# Patient Record
Sex: Male | Born: 1945
Health system: Southern US, Community
[De-identification: ages and names within clinical notes are randomized; demographics above are authoritative.]

## PROBLEM LIST (undated history)

## (undated) DIAGNOSIS — F4310 Post-traumatic stress disorder, unspecified: Secondary | ICD-10-CM

## (undated) DIAGNOSIS — F32A Depression, unspecified: Secondary | ICD-10-CM

## (undated) DIAGNOSIS — J449 Chronic obstructive pulmonary disease, unspecified: Secondary | ICD-10-CM

## (undated) DIAGNOSIS — G4730 Sleep apnea, unspecified: Secondary | ICD-10-CM

## (undated) DIAGNOSIS — I420 Dilated cardiomyopathy: Secondary | ICD-10-CM

## (undated) DIAGNOSIS — F329 Major depressive disorder, single episode, unspecified: Secondary | ICD-10-CM

## (undated) DIAGNOSIS — I10 Essential (primary) hypertension: Secondary | ICD-10-CM

---

## 1997-11-08 ENCOUNTER — Other Ambulatory Visit: Admission: RE | Admit: 1997-11-08 | Discharge: 1997-11-08 | Payer: Self-pay | Admitting: Internal Medicine

## 2001-04-27 ENCOUNTER — Emergency Department (HOSPITAL_COMMUNITY): Admission: EM | Admit: 2001-04-27 | Discharge: 2001-04-27 | Payer: Self-pay | Admitting: Emergency Medicine

## 2001-04-27 ENCOUNTER — Encounter: Payer: Self-pay | Admitting: Emergency Medicine

## 2001-05-04 ENCOUNTER — Emergency Department (HOSPITAL_COMMUNITY): Admission: EM | Admit: 2001-05-04 | Discharge: 2001-05-04 | Payer: Self-pay | Admitting: *Deleted

## 2013-04-24 ENCOUNTER — Inpatient Hospital Stay (HOSPITAL_COMMUNITY)
Admission: EM | Admit: 2013-04-24 | Discharge: 2013-04-27 | DRG: 208 | Disposition: A | Discharge status: Home / Self Care | Payer: Non-veteran care | Attending: Pulmonary Disease | Admitting: Pulmonary Disease

## 2013-04-24 ENCOUNTER — Encounter (HOSPITAL_COMMUNITY): Payer: Self-pay | Admitting: Emergency Medicine

## 2013-04-24 ENCOUNTER — Emergency Department (HOSPITAL_COMMUNITY): Payer: Non-veteran care

## 2013-04-24 DIAGNOSIS — E872 Acidosis, unspecified: Secondary | ICD-10-CM | POA: Diagnosis present

## 2013-04-24 DIAGNOSIS — E876 Hypokalemia: Secondary | ICD-10-CM | POA: Diagnosis present

## 2013-04-24 DIAGNOSIS — Z79899 Other long term (current) drug therapy: Secondary | ICD-10-CM

## 2013-04-24 DIAGNOSIS — J449 Chronic obstructive pulmonary disease, unspecified: Secondary | ICD-10-CM | POA: Diagnosis present

## 2013-04-24 DIAGNOSIS — J441 Chronic obstructive pulmonary disease with (acute) exacerbation: Secondary | ICD-10-CM

## 2013-04-24 DIAGNOSIS — G4730 Sleep apnea, unspecified: Secondary | ICD-10-CM | POA: Diagnosis present

## 2013-04-24 DIAGNOSIS — J9600 Acute respiratory failure, unspecified whether with hypoxia or hypercapnia: Secondary | ICD-10-CM | POA: Diagnosis present

## 2013-04-24 DIAGNOSIS — G9340 Encephalopathy, unspecified: Secondary | ICD-10-CM | POA: Diagnosis not present

## 2013-04-24 DIAGNOSIS — I10 Essential (primary) hypertension: Secondary | ICD-10-CM | POA: Diagnosis present

## 2013-04-24 DIAGNOSIS — J9690 Respiratory failure, unspecified, unspecified whether with hypoxia or hypercapnia: Secondary | ICD-10-CM

## 2013-04-24 DIAGNOSIS — Z87891 Personal history of nicotine dependence: Secondary | ICD-10-CM

## 2013-04-24 HISTORY — DX: Sleep apnea, unspecified: G47.30

## 2013-04-24 HISTORY — DX: Chronic obstructive pulmonary disease, unspecified: J44.9

## 2013-04-24 LAB — POCT I-STAT 3, ART BLOOD GAS (G3+)
Acid-base deficit: 6 mmol/L — ABNORMAL HIGH (ref 0.0–2.0)
Acid-base deficit: 7 mmol/L — ABNORMAL HIGH (ref 0.0–2.0)
BICARBONATE: 27.4 meq/L — AB (ref 20.0–24.0)
Bicarbonate: 23.2 mEq/L (ref 20.0–24.0)
O2 Saturation: 100 %
O2 Saturation: 100 %
PH ART: 7.101 — AB (ref 7.350–7.450)
PO2 ART: 243 mmHg — AB (ref 80.0–100.0)
PO2 ART: 463 mmHg — AB (ref 80.0–100.0)
Patient temperature: 34.6
Patient temperature: 35.3
TCO2: 30 mmol/L (ref 0–100)
TCO2: 25 mmol/L (ref 0–100)
pCO2 arterial: 85.1 mmHg (ref 35.0–45.0)
pCO2 arterial: 61.4 mmHg (ref 35.0–45.0)
pH, Arterial: 7.175 — CL (ref 7.350–7.450)

## 2013-04-24 LAB — BASIC METABOLIC PANEL
BUN: 13 mg/dL (ref 6–23)
CHLORIDE: 98 meq/L (ref 96–112)
CO2: 29 meq/L (ref 19–32)
Calcium: 9.9 mg/dL (ref 8.4–10.5)
Creatinine, Ser: 1.1 mg/dL (ref 0.50–1.35)
GFR calc non Af Amer: 69 mL/min — ABNORMAL LOW (ref >90)
GFR, EST AFRICAN AMERICAN: 79 mL/min — AB (ref >90)
Glucose, Bld: 152 mg/dL — ABNORMAL HIGH (ref 70–99)
POTASSIUM: 3.3 meq/L — AB (ref 3.7–5.3)
Sodium: 142 mEq/L (ref 137–147)

## 2013-04-24 LAB — CG4 I-STAT (LACTIC ACID): LACTIC ACID, VENOUS: 4.88 mmol/L — AB (ref 0.5–2.2)

## 2013-04-24 LAB — CBC
HCT: 50.1 % (ref 39.0–52.0)
Hemoglobin: 16.9 g/dL (ref 13.0–17.0)
MCH: 31.5 pg (ref 26.0–34.0)
MCHC: 33.7 g/dL (ref 30.0–36.0)
MCV: 93.3 fL (ref 78.0–100.0)
Platelets: 272 10*3/uL (ref 150–400)
RBC: 5.37 MIL/uL (ref 4.22–5.81)
RDW: 13.9 % (ref 11.5–15.5)
WBC: 13.7 10*3/uL — AB (ref 4.0–10.5)

## 2013-04-24 LAB — LACTIC ACID, PLASMA: Lactic Acid, Venous: 3.1 mmol/L — ABNORMAL HIGH (ref 0.5–2.2)

## 2013-04-24 LAB — PRO B NATRIURETIC PEPTIDE

## 2013-04-24 LAB — TROPONIN I: Troponin I: <0.3 ng/mL (ref <0.30)

## 2013-04-24 MED ORDER — CALCIUM GLUCONATE 10 % IV SOLN: 1.0000 g | Freq: Once | INTRAVENOUS | Status: DC

## 2013-04-24 MED ORDER — ALBUTEROL SULFATE (2.5 MG/3ML) 0.083% IN NEBU
5.0000 mg | INHALATION_SOLUTION | Freq: Once | RESPIRATORY_TRACT | Status: AC
  Administered 2013-04-24: 5 mg via RESPIRATORY_TRACT
  Filled 2013-04-24: qty 6

## 2013-04-24 MED ORDER — PROPOFOL 10 MG/ML IV EMUL
INTRAVENOUS | Status: AC
  Administered 2013-04-24: 5 ug/kg/min via INTRAVENOUS
  Filled 2013-04-24: qty 100

## 2013-04-24 MED ORDER — MIDAZOLAM HCL 2 MG/2ML IJ SOLN
INTRAMUSCULAR | Status: AC
  Administered 2013-04-24: 2 mg via INTRAVENOUS
  Filled 2013-04-24: qty 2

## 2013-04-24 MED ORDER — SUCCINYLCHOLINE CHLORIDE 20 MG/ML IJ SOLN
100.0000 mg | Freq: Once | INTRAMUSCULAR | Status: AC
  Administered 2013-04-24: 100 mg via INTRAVENOUS

## 2013-04-24 MED ORDER — MIDAZOLAM HCL 2 MG/2ML IJ SOLN
2.0000 mg | INTRAMUSCULAR | Status: DC | PRN
  Administered 2013-04-24 – 2013-04-25 (×3): 2 mg via INTRAVENOUS

## 2013-04-24 MED ORDER — SODIUM CHLORIDE 0.9 % IV SOLN: 1.0000 g | Freq: Once | INTRAVENOUS | Status: DC

## 2013-04-24 MED ORDER — CALCIUM CHLORIDE 10 % IV SOLN
1.0000 g | Freq: Once | INTRAVENOUS | Status: AC
  Administered 2013-04-24: 1 g via INTRAVENOUS

## 2013-04-24 MED ORDER — IPRATROPIUM BROMIDE 0.02 % IN SOLN
0.5000 mg | Freq: Once | RESPIRATORY_TRACT | Status: AC
  Administered 2013-04-24: 0.5 mg via RESPIRATORY_TRACT
  Filled 2013-04-24: qty 2.5

## 2013-04-24 MED ORDER — FENTANYL CITRATE 0.05 MG/ML IJ SOLN
INTRAMUSCULAR | Status: AC
  Administered 2013-04-24: 50 ug via INTRAVENOUS
  Filled 2013-04-24: qty 2

## 2013-04-24 MED ORDER — PROPOFOL 10 MG/ML IV EMUL
5.0000 ug/kg/min | Freq: Once | INTRAVENOUS | Status: DC
  Administered 2013-04-24: 5 ug/kg/min via INTRAVENOUS

## 2013-04-24 MED ORDER — FENTANYL CITRATE 0.05 MG/ML IJ SOLN
50.0000 ug | INTRAMUSCULAR | Status: DC | PRN
  Administered 2013-04-24 – 2013-04-25 (×4): 50 ug via INTRAVENOUS

## 2013-04-24 MED ORDER — ETOMIDATE 2 MG/ML IV SOLN
20.0000 mg | Freq: Once | INTRAVENOUS | Status: AC
  Administered 2013-04-24: 20 mg via INTRAVENOUS

## 2013-04-24 MED ORDER — FENTANYL CITRATE 0.05 MG/ML IJ SOLN
INTRAMUSCULAR | Status: AC
  Filled 2013-04-24: qty 2

## 2013-04-24 MED ORDER — MIDAZOLAM HCL 2 MG/2ML IJ SOLN
INTRAMUSCULAR | Status: AC
  Filled 2013-04-24: qty 2

## 2013-04-24 NOTE — ED Notes (Signed)
PT intubated by Dr. Mingo Amber, 7.5 ETT tube, 25cm at the lip.

## 2013-04-24 NOTE — ED Provider Notes (Signed)
CSN: [id number]     Arrival date & time 04/24/13  2154 History   First MD Initiated Contact with Patient 04/24/13 2154     Chief Complaint  Patient presents with  . Shortness of Breath   (Consider location/radiation/quality/duration/timing/severity/associated sxs/prior Treatment) Patient is a 68 y.o. male presenting with shortness of breath. The history is provided by the EMS personnel. The history is limited by the absence of a caregiver.  Shortness of Breath Severity:  Severe Onset quality:  Sudden Progression:  Worsening Relieved by:  Nothing Ineffective treatments:  Inhaler Associated symptoms: no abdominal pain and no chest pain   Level 5 caveat: Patient unresponsive upon initial evaluation  No past medical history on file. No past surgical history on file. No family history on file. History  Substance Use Topics  . Smoking status: Not on file  . Smokeless tobacco: Not on file  . Alcohol Use: Not on file    Review of Systems  Unable to perform ROS: Patient unresponsive  Respiratory: Positive for shortness of breath.   Cardiovascular: Negative for chest pain.  Gastrointestinal: Negative for abdominal pain.    Allergies  Review of patient's allergies indicates no known allergies.  Home Medications   Current Outpatient Rx  Name  Route  Sig  Dispense  Refill  . albuterol (PROVENTIL HFA;VENTOLIN HFA) 108 (90 BASE) MCG/ACT inhaler   Inhalation   Inhale 2 puffs into the lungs every 6 (six) hours.         . budesonide-formoterol (SYMBICORT) 160-4.5 MCG/ACT inhaler   Inhalation   Inhale 2 puffs into the lungs 2 (two) times daily.         [name] Kitchen escitalopram (LEXAPRO) 10 MG tablet   Oral   Take 10 mg by mouth daily.         . hydrochlorothiazide (HYDRODIURIL) 25 MG tablet   Oral   Take 25 mg by mouth daily.         . hydroxypropyl methylcellulose (ISOPTO TEARS) 2.5 % ophthalmic solution   Both Eyes   Place 2 drops into both eyes 5 (five) times daily as needed  for dry eyes. For dry eyes         . hydrOXYzine (ATARAX/VISTARIL) 25 MG tablet   Oral   Take 25-50 mg by mouth 3 (three) times daily as needed for itching.         . magnesium oxide (MAG-OX) 400 MG tablet   Oral   Take 400 mg by mouth daily. For leg cramps         . Multiple Vitamin (MULTIVITAMIN WITH MINERALS) TABS tablet   Oral   Take 1 tablet by mouth daily.         [name] Kitchen tiotropium (SPIRIVA) 18 MCG inhalation capsule   Inhalation   Place 18 mcg into inhaler and inhale daily.         . ALBUTEROL IN   Inhalation   Inhale into the lungs once. Per notes-Albuterol nebulizer treatments x2 doses given en route by EMS          BP 137/104  Pulse 100  Resp 20  Wt 154 lb 5.2 oz (70 kg)  SpO2 97% Physical Exam  Constitutional: He appears well-developed and well-nourished. He appears toxic. No distress. Nasal cannula in place.  HENT:  Head: Normocephalic and atraumatic.  Eyes: Pupils are equal, round, and reactive to light.  Neck: Normal range of motion.  Cardiovascular: Normal rate and regular rhythm.   Pulmonary/Chest: Tachypnea noted. He  is in respiratory distress. He has decreased breath sounds. He has wheezes.  Abdominal: Soft. He exhibits no distension. There is no tenderness.  Musculoskeletal: Normal range of motion.  Neurological: He is unresponsive. GCS eye subscore is 2. GCS verbal subscore is 2. GCS motor subscore is 4.  Skin: Skin is warm. He is not diaphoretic.    ED Course  Procedures (including critical care time) Labs Review Labs Reviewed  CBC - Abnormal; Notable for the following:    WBC 13.7 (*)    All other components within normal limits  LACTIC ACID, PLASMA - Abnormal; Notable for the following:    Lactic Acid, Venous 3.1 (*)    All other components within normal limits  POCT I-STAT 3, BLOOD GAS (G3+) - Abnormal; Notable for the following:    pH, Arterial 7.101 (*)    pCO2 arterial 85.1 (*)    pO2, Arterial 243.0 (*)    Bicarbonate 27.4 (*)     Acid-base deficit 6.0 (*)    All other components within normal limits  CG4 I-STAT (LACTIC ACID) - Abnormal; Notable for the following:    Lactic Acid, Venous 4.88 (*)    All other components within normal limits  POCT I-STAT 3, BLOOD GAS (G3+) - Abnormal; Notable for the following:    pH, Arterial 7.175 (*)    pCO2 arterial 61.4 (*)    pO2, Arterial 463.0 (*)    Acid-base deficit 7.0 (*)    All other components within normal limits  BASIC METABOLIC PANEL  TROPONIN I  PRO B NATRIURETIC PEPTIDE  URINALYSIS, ROUTINE W REFLEX MICROSCOPIC   Imaging Review Dg Chest Portable 1 View  04/24/2013   CLINICAL DATA:  Status post ET tube placement.  EXAM: PORTABLE CHEST - 1 VIEW  COMPARISON:  None.  FINDINGS: ET tube is in place with the tip 3.6 cm above the carina. NG tube courses into the stomach and below the inferior margin of the film. Lungs are clear. Heart size is normal. No pneumothorax or pleural effusion. Remote left 4th and 5th rib fractures are noted.  IMPRESSION: ET tube tip is 3.6 cm with a carina. NG tube tip is below the inferior margin of the film.  Lungs clear.   Electronically Signed   By: Inge Rise M.D.   On: 04/24/2013 22:31    EKG Interpretation   None       MDM   1. Respiratory failure    68 yo M with hx of COPD presents in respiratory distress.   Patient with GCS <8 upon arrival. HDS en route, went unresponsive. Patient intubated upon arrival, as described above. Labs demonstrate acute respiratory acidosis. Will ventilate to reduce PCO2, will admit to ICU. Otherwise HDS.   Patient will be admitted to ICU with intensivist. Consulted critical care, fellow to evaluate. Patient given nebs through ETT given continued expiratory wheezing. Propofol gtt for sedation caused mild hypotension, will transition to prn fentanyl/versed. Anticipate admission to ICU in poor but stable condition with critical care. Patient seen and evaluated by myself and my attending, Dr.  Karle Starch.      Freddi Che, MD 04/24/13 2350

## 2013-04-24 NOTE — ED Notes (Signed)
Per EMS, pt had sudden onset of SOB this afternoon. Pt was a&o x4 in route and then became unresponsive five minutes before arriving at our facility. Pt vomited prior to arrival. Pt has hx of COPD, Emphysema, and hypertension. Pt given 2 albuterol treatments and 125 of solumedrol en route.

## 2013-04-24 NOTE — ED Notes (Signed)
Family at bedside. 

## 2013-04-24 NOTE — ED Notes (Signed)
1 amp of Calcium Chloride given.

## 2013-04-24 NOTE — ED Provider Notes (Addendum)
I saw and evaluated the patient, reviewed the resident's note and I agree with the findings and plan.  EKG Interpretation    Date/Time:  Saturday April 24 2013 22:16:04 EST Ventricular Rate:  121 PR Interval:  140 QRS Duration: 88 QT Interval:  318 QTC Calculation: 451 R Axis:   84 Text Interpretation:  Sinus tachycardia Probable left atrial enlargement Borderline right axis deviation ST elevation, consider inferior injury Since last tracing wide complrex tachycardia resolved Confirmed by Karle Starch  MD, CHARLES ([id number]) on 04/24/2013 11:56:56 PM           CRITICAL CARE Performed by: Truddie Hidden. Total critical care time: 35 Critical care time was exclusive of separately billable procedures and treating other patients. Critical care was necessary to treat or prevent imminent or life-threatening deterioration. Critical care was time spent personally by me on the following activities: development of treatment plan with patient and/or surrogate as well as nursing, discussions with consultants, evaluation of patient's response to treatment, examination of patient, obtaining history from patient or surrogate, ordering and performing treatments and interventions, ordering and review of laboratory studies, ordering and review of radiographic studies, pulse oximetry and re-evaluation of patient's condition.   Pt with severe COPD exacerbation, given nebs enroute but worsening mental status, also vomited prior to arrival so not a candidate for BiPAP. EKG rhythm change during intubation resolved spontaneously. EKGs reviewed with DR. Cooper who did not feel this was STEMI.   I was present at bedside during intubation.   Charles B. Karle Starch, MD 04/24/13 [place] Karle Starch, MD 04/25/13 3267

## 2013-04-24 NOTE — Progress Notes (Signed)
Chaplain responded to page from secretary concerning pt being intubated. Family was calm and said they were "great." No further needs at this time.

## 2013-04-24 NOTE — ED Notes (Signed)
EDP Sheldon at the bedside.

## 2013-04-24 NOTE — ED Notes (Signed)
Family in consultation room A 

## 2013-04-25 DIAGNOSIS — J449 Chronic obstructive pulmonary disease, unspecified: Secondary | ICD-10-CM | POA: Diagnosis present

## 2013-04-25 DIAGNOSIS — J9600 Acute respiratory failure, unspecified whether with hypoxia or hypercapnia: Secondary | ICD-10-CM

## 2013-04-25 DIAGNOSIS — J441 Chronic obstructive pulmonary disease with (acute) exacerbation: Secondary | ICD-10-CM | POA: Diagnosis present

## 2013-04-25 LAB — CBC
HCT: 43.2 % (ref 39.0–52.0)
HEMOGLOBIN: 14.7 g/dL (ref 13.0–17.0)
MCH: 31.4 pg (ref 26.0–34.0)
MCHC: 34 g/dL (ref 30.0–36.0)
MCV: 92.3 fL (ref 78.0–100.0)
Platelets: 198 10*3/uL (ref 150–400)
RBC: 4.68 MIL/uL (ref 4.22–5.81)
RDW: 14 % (ref 11.5–15.5)
WBC: 13.9 10*3/uL — AB (ref 4.0–10.5)

## 2013-04-25 LAB — BLOOD GAS, ARTERIAL
ACID-BASE DEFICIT: 3.3 mmol/L — AB (ref 0.0–2.0)
Bicarbonate: 20.2 mEq/L (ref 20.0–24.0)
Drawn by: 34779
FIO2: 0.4 %
O2 SAT: 98.9 %
PATIENT TEMPERATURE: 98.6
PCO2 ART: 30.2 mmHg — AB (ref 35.0–45.0)
PEEP: 5 cmH2O
RATE: 22 resp/min
TCO2: 21.1 mmol/L (ref 0–100)
VT: 620 mL
pH, Arterial: 7.44 (ref 7.350–7.450)
pO2, Arterial: 121 mmHg — ABNORMAL HIGH (ref 80.0–100.0)

## 2013-04-25 LAB — BASIC METABOLIC PANEL
BUN: 15 mg/dL (ref 6–23)
CALCIUM: 9.3 mg/dL (ref 8.4–10.5)
CO2: 23 meq/L (ref 19–32)
Chloride: 104 mEq/L (ref 96–112)
Creatinine, Ser: 0.95 mg/dL (ref 0.50–1.35)
GFR calc Af Amer: >90 mL/min (ref >90)
GFR calc non Af Amer: 85 mL/min — ABNORMAL LOW (ref >90)
Glucose, Bld: 130 mg/dL — ABNORMAL HIGH (ref 70–99)
Potassium: 4.8 mEq/L (ref 3.7–5.3)
Sodium: 140 mEq/L (ref 137–147)

## 2013-04-25 LAB — URINALYSIS, ROUTINE W REFLEX MICROSCOPIC
BILIRUBIN URINE: NEGATIVE
Glucose, UA: NEGATIVE mg/dL
Ketones, ur: NEGATIVE mg/dL
Leukocytes, UA: NEGATIVE
Nitrite: NEGATIVE
Protein, ur: >300 mg/dL — AB
Specific Gravity, Urine: 1.021 (ref 1.005–1.030)
UROBILINOGEN UA: 0.2 mg/dL (ref 0.0–1.0)
pH: 6.5 (ref 5.0–8.0)

## 2013-04-25 LAB — URINE MICROSCOPIC-ADD ON

## 2013-04-25 LAB — GLUCOSE, CAPILLARY
GLUCOSE-CAPILLARY: 125 mg/dL — AB (ref 70–99)
GLUCOSE-CAPILLARY: 126 mg/dL — AB (ref 70–99)
GLUCOSE-CAPILLARY: 136 mg/dL — AB (ref 70–99)
GLUCOSE-CAPILLARY: 145 mg/dL — AB (ref 70–99)
GLUCOSE-CAPILLARY: 164 mg/dL — AB (ref 70–99)
Glucose-Capillary: 157 mg/dL — ABNORMAL HIGH (ref 70–99)

## 2013-04-25 LAB — MAGNESIUM
MAGNESIUM: 2 mg/dL (ref 1.5–2.5)
Magnesium: 2 mg/dL (ref 1.5–2.5)

## 2013-04-25 LAB — RENAL FUNCTION PANEL
Albumin: 3.4 g/dL — ABNORMAL LOW (ref 3.5–5.2)
BUN: 14 mg/dL (ref 6–23)
CHLORIDE: 105 meq/L (ref 96–112)
CO2: 21 meq/L (ref 19–32)
Calcium: 9.2 mg/dL (ref 8.4–10.5)
Creatinine, Ser: 0.98 mg/dL (ref 0.50–1.35)
GFR calc non Af Amer: 84 mL/min — ABNORMAL LOW (ref >90)
GLUCOSE: 159 mg/dL — AB (ref 70–99)
Phosphorus: 2.8 mg/dL (ref 2.3–4.6)
Potassium: 5.9 mEq/L — ABNORMAL HIGH (ref 3.7–5.3)
SODIUM: 141 meq/L (ref 137–147)

## 2013-04-25 LAB — PROTIME-INR
INR: 1.03 (ref 0.00–1.49)
Prothrombin Time: 13.3 seconds (ref 11.6–15.2)

## 2013-04-25 LAB — LACTIC ACID, PLASMA
LACTIC ACID, VENOUS: 3.9 mmol/L — AB (ref 0.5–2.2)
LACTIC ACID, VENOUS: 3.9 mmol/L — AB (ref 0.5–2.2)

## 2013-04-25 LAB — APTT: aPTT: 27 seconds (ref 24–37)

## 2013-04-25 LAB — TRIGLYCERIDES: Triglycerides: 29 mg/dL (ref <150)

## 2013-04-25 LAB — SURGICAL PCR SCREEN
MRSA, PCR: NEGATIVE
Staphylococcus aureus: NEGATIVE

## 2013-04-25 MED ORDER — PROPOFOL 10 MG/ML IV EMUL: 0.0000 ug/kg/min | INTRAVENOUS | Status: DC

## 2013-04-25 MED ORDER — BUDESONIDE 0.5 MG/2ML IN SUSP
0.5000 mg | Freq: Two times a day (BID) | RESPIRATORY_TRACT | Status: DC
  Administered 2013-04-25 – 2013-04-27 (×5): 0.5 mg via RESPIRATORY_TRACT
  Filled 2013-04-25 (×7): qty 2

## 2013-04-25 MED ORDER — INSULIN ASPART 100 UNIT/ML ~~LOC~~ SOLN
0.0000 [IU] | SUBCUTANEOUS | Status: DC
  Administered 2013-04-25: 2 [IU] via SUBCUTANEOUS
  Administered 2013-04-25 (×2): 3 [IU] via SUBCUTANEOUS
  Administered 2013-04-25 – 2013-04-27 (×7): 2 [IU] via SUBCUTANEOUS

## 2013-04-25 MED ORDER — DEXTROSE-NACL 5-0.45 % IV SOLN
INTRAVENOUS | Status: DC
  Administered 2013-04-25: 10:00:00 via INTRAVENOUS

## 2013-04-25 MED ORDER — FENTANYL CITRATE 0.05 MG/ML IJ SOLN
INTRAMUSCULAR | Status: AC
  Filled 2013-04-25: qty 2

## 2013-04-25 MED ORDER — PANTOPRAZOLE SODIUM 40 MG PO TBEC
40.0000 mg | DELAYED_RELEASE_TABLET | Freq: Every day | ORAL | Status: DC
  Administered 2013-04-25: 40 mg via ORAL
  Filled 2013-04-25: qty 1

## 2013-04-25 MED ORDER — METHYLPREDNISOLONE SODIUM SUCC 125 MG IJ SOLR
60.0000 mg | Freq: Three times a day (TID) | INTRAMUSCULAR | Status: DC
  Administered 2013-04-25 – 2013-04-26 (×3): 60 mg via INTRAVENOUS
  Filled 2013-04-25 (×6): qty 0.96

## 2013-04-25 MED ORDER — POTASSIUM CHLORIDE 10 MEQ/100ML IV SOLN
10.0000 meq | INTRAVENOUS | Status: AC
  Administered 2013-04-25: 10 meq via INTRAVENOUS
  Filled 2013-04-25 (×2): qty 100

## 2013-04-25 MED ORDER — METHYLPREDNISOLONE SODIUM SUCC 125 MG IJ SOLR
60.0000 mg | Freq: Four times a day (QID) | INTRAMUSCULAR | Status: DC
  Administered 2013-04-25 (×2): 60 mg via INTRAVENOUS
  Filled 2013-04-25 (×5): qty 0.96
  Filled 2013-04-25: qty 2

## 2013-04-25 MED ORDER — BIOTENE DRY MOUTH MT LIQD
15.0000 mL | Freq: Four times a day (QID) | OROMUCOSAL | Status: DC
  Administered 2013-04-25 (×3): 15 mL via OROMUCOSAL

## 2013-04-25 MED ORDER — CHLORHEXIDINE GLUCONATE 0.12 % MT SOLN
15.0000 mL | Freq: Two times a day (BID) | OROMUCOSAL | Status: DC
  Administered 2013-04-25: 15 mL via OROMUCOSAL
  Filled 2013-04-25: qty 15

## 2013-04-25 MED ORDER — POTASSIUM CHLORIDE 10 MEQ/100ML IV SOLN
10.0000 meq | INTRAVENOUS | Status: DC
  Administered 2013-04-25: 10 meq via INTRAVENOUS

## 2013-04-25 MED ORDER — LEVOFLOXACIN IN D5W 750 MG/150ML IV SOLN
750.0000 mg | INTRAVENOUS | Status: DC
  Administered 2013-04-25 – 2013-04-26 (×2): 750 mg via INTRAVENOUS
  Filled 2013-04-25 (×2): qty 150

## 2013-04-25 MED ORDER — ARFORMOTEROL TARTRATE 15 MCG/2ML IN NEBU
15.0000 ug | INHALATION_SOLUTION | Freq: Two times a day (BID) | RESPIRATORY_TRACT | Status: DC
  Administered 2013-04-25 – 2013-04-27 (×5): 15 ug via RESPIRATORY_TRACT
  Filled 2013-04-25 (×7): qty 2

## 2013-04-25 MED ORDER — ESCITALOPRAM OXALATE 10 MG PO TABS
10.0000 mg | ORAL_TABLET | Freq: Every day | ORAL | Status: DC
  Administered 2013-04-25 – 2013-04-27 (×3): 10 mg via ORAL
  Filled 2013-04-25 (×3): qty 1

## 2013-04-25 MED ORDER — VECURONIUM BROMIDE 10 MG IV SOLR: 10.0000 mg | Freq: Once | INTRAVENOUS | Status: DC

## 2013-04-25 MED ORDER — PANTOPRAZOLE SODIUM 40 MG PO PACK
40.0000 mg | PACK | Freq: Every day | ORAL | Status: DC
  Filled 2013-04-25: qty 20

## 2013-04-25 MED ORDER — BIOTENE DRY MOUTH MT LIQD
15.0000 mL | Freq: Two times a day (BID) | OROMUCOSAL | Status: DC
  Administered 2013-04-25: 15 mL via OROMUCOSAL

## 2013-04-25 MED ORDER — SODIUM CHLORIDE 0.9 % IV SOLN
25.0000 ug/h | INTRAVENOUS | Status: DC
  Administered 2013-04-25: 25 ug/h via INTRAVENOUS
  Filled 2013-04-25: qty 50

## 2013-04-25 MED ORDER — IPRATROPIUM-ALBUTEROL 0.5-2.5 (3) MG/3ML IN SOLN
3.0000 mL | RESPIRATORY_TRACT | Status: DC
  Administered 2013-04-25 (×3): 3 mL via RESPIRATORY_TRACT
  Filled 2013-04-25 (×3): qty 3

## 2013-04-25 MED ORDER — MIDAZOLAM HCL 2 MG/2ML IJ SOLN
INTRAMUSCULAR | Status: AC
  Filled 2013-04-25: qty 2

## 2013-04-25 MED ORDER — ASPIRIN 81 MG PO CHEW: 324.0000 mg | CHEWABLE_TABLET | ORAL | Status: AC

## 2013-04-25 MED ORDER — ASPIRIN 300 MG RE SUPP
300.0000 mg | RECTAL | Status: AC
Start: 2013-04-25 — End: 2013-04-25
  Administered 2013-04-25: 300 mg via RECTAL
  Filled 2013-04-25: qty 1

## 2013-04-25 MED ORDER — ALBUTEROL SULFATE (2.5 MG/3ML) 0.083% IN NEBU: 2.5000 mg | INHALATION_SOLUTION | RESPIRATORY_TRACT | Status: DC | PRN

## 2013-04-25 MED ORDER — SODIUM CHLORIDE 0.9 % IV SOLN: 250.0000 mL | INTRAVENOUS | Status: DC | PRN

## 2013-04-25 MED ORDER — DEXTROSE IN LACTATED RINGERS 5 % IV SOLN
INTRAVENOUS | Status: DC
  Administered 2013-04-25: 02:00:00 via INTRAVENOUS

## 2013-04-25 MED ORDER — HEPARIN SODIUM (PORCINE) 5000 UNIT/ML IJ SOLN
5000.0000 [IU] | Freq: Three times a day (TID) | INTRAMUSCULAR | Status: DC
  Administered 2013-04-25 – 2013-04-27 (×8): 5000 [IU] via SUBCUTANEOUS
  Filled 2013-04-25 (×10): qty 1

## 2013-04-25 MED ORDER — PROPOFOL 10 MG/ML IV EMUL: 5.0000 ug/kg/min | Freq: Once | INTRAVENOUS | Status: DC

## 2013-04-25 MED FILL — Medication: Qty: 1 | Status: AC

## 2013-04-25 NOTE — Progress Notes (Addendum)
156ml from discontinued Fentanyl gtt wasted and rinsed down sink. Witnessed by 2nd RN, Vivia Ewing RN.  Candyce Churn RN  Witnessed Fentanyl waste of 141ml in sink with Candyce Churn, RN.

## 2013-04-25 NOTE — Plan of Care (Signed)
Problem: Phase I Progression Outcomes Goal: Voiding-avoid urinary catheter unless indicated Outcome: Not Applicable Date Met:  44/97/53 Foley in place on admission, but d/c'd after extubation  Problem: Phase II Progression Outcomes Goal: Date pt extubated/weaned off vent Outcome: Completed/Met Date Met:  04/25/13 Pt extubated 04/25/13 about 0915

## 2013-04-25 NOTE — H&P (Addendum)
PULMONARY  / CRITICAL CARE MEDICINE  Name: Jeremiah [name] MRN: [id number] DOB: 01/02/1946    ADMISSION DATE:  04/24/2013 CONSULTATION DATE:  1/11  REFERRING MD :  ED PRIMARY SERVICE: Pulm CCM  CHIEF COMPLAINT:  Severe COPD exacerbation  BRIEF PATIENT DESCRIPTION: 68 yo male with abrupt onset of sob that progressively worsened until EMS called.  Patient intubated in ED due to severe respiratory distress.  SIGNIFICANT EVENTS / STUDIES:  CXR Clear without evidence of pneumonia  LINES / TUBES: none  CULTURES: Viral panel pending  ANTIBIOTICS: Levaquin #1  HISTORY OF PRESENT ILLNESS:  68 yo male with abrupt onset of sob that progressively worsened until EMS called.  Patient intubated in ED due to severe respiratory distress.  In route, patient with increasing tachypnea, decline SpO2, and worsening obtundation.  Patient with GCS <8 at arrival, evidence of emesis, intubated for severe respiratory distress.    Limited history given by sister.  With family earlier tonight, no evidence of fevers, chills, malaise, and was at baseline health.  Worsening coughing and sob while with family.  Initial episode passed, however sister received a call later with him reporting a return of symptoms, complaints of inability ot breathing and EMS alerted.  Unknown if flu shot Distant history of tobacco abuse No sick contacts Has never been hospitalized for COPD, no recent hospitalizations, and has never been intubated    PAST MEDICAL HISTORY :  Past Medical History  Diagnosis Date  . COPD (chronic obstructive pulmonary disease)   . Sleep apnea    History reviewed. No pertinent past surgical history. Prior to Admission medications   Medication Sig Start Date End Date Taking? Authorizing Provider  albuterol (PROVENTIL HFA;VENTOLIN HFA) 108 (90 BASE) MCG/ACT inhaler Inhale 2 puffs into the lungs every 6 (six) hours.   Yes Historical Provider, MD  budesonide-formoterol (SYMBICORT) 160-4.5 MCG/ACT  inhaler Inhale 2 puffs into the lungs 2 (two) times daily.   Yes Historical Provider, MD  escitalopram (LEXAPRO) 10 MG tablet Take 10 mg by mouth daily.   Yes Historical Provider, MD  hydrochlorothiazide (HYDRODIURIL) 25 MG tablet Take 25 mg by mouth daily.   Yes Historical Provider, MD  hydroxypropyl methylcellulose (ISOPTO TEARS) 2.5 % ophthalmic solution Place 2 drops into both eyes 5 (five) times daily as needed for dry eyes. For dry eyes   Yes Historical Provider, MD  hydrOXYzine (ATARAX/VISTARIL) 25 MG tablet Take 25-50 mg by mouth 3 (three) times daily as needed for itching.   Yes Historical Provider, MD  magnesium oxide (MAG-OX) 400 MG tablet Take 400 mg by mouth daily. For leg cramps   Yes Historical Provider, MD  Multiple Vitamin (MULTIVITAMIN WITH MINERALS) TABS tablet Take 1 tablet by mouth daily.   Yes Historical Provider, MD  tiotropium (SPIRIVA) 18 MCG inhalation capsule Place 18 mcg into inhaler and inhale daily.   Yes Historical Provider, MD  ALBUTEROL IN Inhale into the lungs once. Per notes-Albuterol nebulizer treatments x2 doses given en route by EMS    Historical Provider, MD   No Known Allergies  FAMILY HISTORY:  History reviewed. No pertinent family history. SOCIAL HISTORY:  reports that he has quit smoking. He does not have any smokeless tobacco history on file. He reports that he does not drink alcohol or use illicit drugs.  REVIEW OF SYSTEMS:  Unable to complete due to patient due to acute illness  SUBJECTIVE:   VITAL SIGNS: Temp:  [95.5 F (35.3 C)-96.6 F (35.9 C)] 96.6 F (35.9 C) (01/11  0045) Pulse Rate:  [55-128] 88 (01/11 0045) Resp:  [15-25] 22 (01/11 0045) BP: (77-153)/(64-116) 116/93 mmHg (01/11 0045) SpO2:  [95 %-100 %] 100 % (01/11 0045) FiO2 (%):  [60 %-100 %] 60 % (01/11 0045) Weight:  [154 lb 5.2 oz (70 kg)] 154 lb 5.2 oz (70 kg) (01/10 2228) HEMODYNAMICS:   VENTILATOR SETTINGS: Vent Mode:  [-] PRVC FiO2 (%):  [60 %-100 %] 60 % Set Rate:   [16 bmp-22 bmp] 22 bmp Vt Set:  [500 mL-570 mL] 570 mL PEEP:  [5 cmH20] 5 cmH20 INTAKE / OUTPUT: Intake/Output   None     PHYSICAL EXAMINATION: General:  Sedated, no distress, not breathing over ventilator.  Non diaphoretic  Neuro:  Obtunded, post intubation with succ  HEENT:  Oral mucosa pink and moist, no scleral edema, pupils fix and pinpoint, no conjunctivits Cardiovascular:  RRR, no murmurs rubs or gallops, normal S1S2, no heaves, warm dry extremities with +2 DP and radial pulses Lungs:  Patient with decreased air movent, noted minimal wheezes throughout lung fields, no evidence of rales/rhonchi.  Resolved tachypnea  Abdomen:  Non tender, non distended, normal bowel sounds, no rigidity Musculoskeletal:  No edema, no evidence of PVD  Skin:  No skin break down, no rashes or ecchymosis  LABS:  Recent Labs Lab 04/24/13 2224 04/24/13 2242 04/24/13 2243 04/24/13 2250 04/24/13 2308  HGB  --  16.9  --   --   --   WBC  --  13.7*  --   --   --   PLT  --  272  --   --   --   NA  --  142  --   --   --   K  --  3.3*  --   --   --   CL  --  98  --   --   --   CO2  --  29  --   --   --   GLUCOSE  --  152*  --   --   --   BUN  --  13  --   --   --   CREATININE  --  1.10  --   --   --   CALCIUM  --  9.9  --   --   --   LATICACIDVEN  --  3.1*  --  4.88*  --   TROPONINI  --   --  <0.30  --   --   PROBNP  --   --  <5.0  --   --   PHART 7.101*  --   --   --  7.175*  PCO2ART 85.1*  --   --   --  61.4*  PO2ART 243.0*  --   --   --  463.0*   No results found for this basename: GLUCAP,  in the last 168 hours  CXR: Patient with ET tube in correct position, no evidence of pulmonary edema or infiltrate  ASSESSMENT / PLAN:  PULMONARY A:COPD exacerbation with Hypercapnic respiratory failure Unknown precipitate, possibly viral At baseline health until acute onset of symptoms No history of recent hospitalizations or intubations P:   Patient started on lung protective ventilator  strategy <8 cc/kg IDW Peak airway pressures <30 PRVC and PS as tolerated Avoid dyssynchrony Sedation: Propfol and fentanyl gtt  Solumedrol 60mg  Q6 IV Duoneb Q4 hours Levaquin for severe COPD exacerbation and not pneumonia, likely can de-escalate rapidly as symptoms improve  No cultures as  patient without evidence of actual pneumonia Lactic acid likely due to respiratory distress, will follow up  CARDIOVASCULAR A: HTN P:  Non-issue, holding medications  RENAL A:  Electrolytes Hypokalemia of 3.3 Likely due to severe acidosis P:   Replace with 20 meq IV and recheck  GASTROINTESTINAL A:  GI prophylaxis P:   Protonix in place If not extubated in 24 hours will need to start tube feeds  HEMATOLOGIC A:  Non-issue P:  n/a  INFECTIOUS A:  See above P:   screen for viral etiology  ENDOCRINE A:  Non-issue   P:   N/A  NEUROLOGIC A:  Unable to assess No history of neurologic findings other than AMS due to severe respiratory distress Correct underlying issues and re-assess  I have personally obtained a history, examined the patient, evaluated laboratory and imaging results, formulated the assessment and plan and placed orders. CRITICAL CARE: The patient is critically ill with multiple organ systems failure and requires high complexity decision making for assessment and support, frequent evaluation and titration of therapies, application of advanced monitoring technologies and extensive interpretation of multiple databases. Critical Care Time devoted to patient care services described in this note is 45 minutes.    Pulmonary and Echo Pager: [phone]  04/25/2013, 1:29 AM

## 2013-04-25 NOTE — ED Notes (Signed)
Dr Ford at bedside.

## 2013-04-25 NOTE — Procedures (Signed)
Extubation Procedure Note  Patient Details:   Name: Jeremiah [name] DOB: 01-30-1946 MRN: [id number]   Airway Documentation:     Evaluation  O2 sats: stable throughout Complications: No apparent complications Patient did tolerate procedure well. Bilateral Breath Sounds: Diminished Suctioning: Airway Yes  Revonda Standard 04/25/2013, 9:09 AM

## 2013-04-25 NOTE — Progress Notes (Signed)
Name: Jeremiah [name] MRN: [id number] DOB: 08-24-1945    ADMISSION DATE:  04/24/2013 CONSULTATION DATE:  1/11  REFERRING MD :  ED PRIMARY SERVICE: Pulm CCM  CHIEF COMPLAINT:  Severe COPD exacerbation  BRIEF PATIENT DESCRIPTION: 68 yo male with abrupt onset of sob 1/11 that progressively worsened until EMS called.  Patient intubated in ED due to severe respiratory distress.  SIGNIFICANT EVENTS / STUDIES:  1/11: CXR: Clear without evidence of pneumonia, intubated, transferred to the ICU, Extubated later in the AM 1/11 after MS improved.   LINES / TUBES: ETT 1/11>>>1/11  CULTURES: Viral panel 1/11>>>  ANTIBIOTICS: Levaquin 1/11>>>  SUBJECTIVE:  Passed SBT, fully awake, no distress.  VITAL SIGNS: Temp:  [95.5 F (35.3 C)-98.1 F (36.7 C)] 98.1 F (36.7 C) (01/11 0725) Pulse Rate:  [55-128] 80 (01/11 0800) Resp:  [15-25] 18 (01/11 0800) BP: (77-153)/(55-116) 107/81 mmHg (01/11 0800) SpO2:  [95 %-100 %] 100 % (01/11 0800) FiO2 (%):  [40 %-100 %] 40 % (01/11 0757) Weight:  [70 kg (154 lb 5.2 oz)-75.297 kg (166 lb)] 75.297 kg (166 lb) (01/11 0245) HEMODYNAMICS:   VENTILATOR SETTINGS: Vent Mode:  [-] PRVC FiO2 (%):  [40 %-100 %] 40 % Set Rate:  [16 bmp-22 bmp] 18 bmp Vt Set:  [500 mL-620 mL] 620 mL PEEP:  [5 cmH20] 5 cmH20 Plateau Pressure:  [14 cmH20-21 cmH20] 20 cmH20 INTAKE / OUTPUT: Intake/Output     01/10 0701 - 01/11 0700 01/11 0701 - 01/12 0700   I.V. (mL/kg) 458.3 (6.1)    NG/GT 30    IV Piggyback 150    Total Intake(mL/kg) 638.3 (8.5)    Urine (mL/kg/hr) 630    Other 400    Total Output 1030     Net -391.7            PHYSICAL EXAMINATION: General: awake, no distress, appropriate Neuro:  Fully awake and oriented. Follows commands  HEENT:  Oral mucosa pink and moist, no scleral edema, orally intubated  Cardiovascular:  RRR, no murmurs rubs or gallops, normal S1S2, no heaves, warm dry extremities with +2 DP and radial pulses Lungs:  Prolonged expiratory  wheeze Abdomen:  Non tender, non distended, normal bowel sounds, no rigidity Musculoskeletal:  No edema, no evidence of PVD Skin:  No skin break down, no rashes or ecchymosis  LABS:  CBC Recent Labs     04/24/13  2242  04/25/13  0700  WBC  13.7*  13.9*  HGB  16.9  14.7  HCT  50.1  43.2  PLT  272  198    Coag's Recent Labs     04/25/13  0700  INR  SPECIMEN CLOTTED    BMET Recent Labs     04/24/13  2242  04/25/13  0700  NA  142  141  K  3.3*  5.9*  CL  98  105  CO2  29  21  BUN  13  14  CREATININE  1.10  0.98  GLUCOSE  152*  159*    Electrolytes Recent Labs     04/24/13  2242  04/25/13  0700  CALCIUM  9.9  9.2  MG   --   2.0  2.0  PHOS   --   2.8    Sepsis Markers No results found for this basename: LACTICACIDVEN, PROCALCITON, O2SATVEN,  in the last 72 hours  ABG Recent Labs     04/24/13  2224  04/24/13  2308  04/25/13  0416  PHART  7.101*  7.175*  7.440  PCO2ART  85.1*  61.4*  30.2*  PO2ART  243.0*  463.0*  121.0*    Liver Enzymes Recent Labs     04/25/13  0700  ALBUMIN  3.4*    Cardiac Enzymes Recent Labs     04/24/13  2243  TROPONINI  <0.30  PROBNP  <5.0    Glucose Recent Labs     04/25/13  0724  GLUCAP  145*    Imaging Dg Chest Portable 1 View  04/24/2013   CLINICAL DATA:  Status post ET tube placement.  EXAM: PORTABLE CHEST - 1 VIEW  COMPARISON:  None.  FINDINGS: ET tube is in place with the tip 3.6 cm above the carina. NG tube courses into the stomach and below the inferior margin of the film. Lungs are clear. Heart size is normal. No pneumothorax or pleural effusion. Remote left 4th and 5th rib fractures are noted.  IMPRESSION: ET tube tip is 3.6 cm with a carina. NG tube tip is below the inferior margin of the film.  Lungs clear.   Electronically Signed   By: Inge Rise M.D.   On: 04/24/2013 22:31      CXR: Patient with ET tube in correct position, no evidence of pulmonary edema or infiltrate  ASSESSMENT /  PLAN:  PULMONARY A:COPD exacerbation with Hypercapnic respiratory failure: Unknown precipitate, possibly viral >passed SBT w/ improved MS. Ready for extubation  P:   Extubate Stop sedation  Titrate N/C O2 w/ PRN BIPAP available  if needed Scheduled budesonide and brovan See ID section  Sedation: Propfol and fentanyl gtt Taper steroids  CARDIOVASCULAR A: HTN P:  Non-issue, holding medications  RENAL A:   Hyperkalemia (think this is likely due to hemolyzed sample) Lactic acidosis (from resp distress), clinically improved.  P:   Repeat chemistry  Cont gentle hydration   GASTROINTESTINAL A:   GI prophylaxis P:   PPI Start diet later 1/11 if doing well from resp stand-point   HEMATOLOGIC A:  Non-issue P:  [place] heparin   INFECTIOUS A:  See above P:   screen for viral etiology  ENDOCRINE A:  Non-issue   P:   N/A  NEUROLOGIC A:  Acute encephalopathy resolved P  Supportive care Avoid sedation    04/25/2013, 8:52 AM   Reviewed above, examined pt, and agree with assessment/plan.  Had AECOPD with hypoxic/hypercapnic respiratory failure likely related to weather change.  Extubated this AM.  Will monitor in ICU for now.  CC time 40 minutes.  Chesley Mires, MD [facility] Pulmonary/Critical Care 04/25/2013, 9:37 AM Pager:  [phone] After 3pm call: [phone]

## 2013-04-26 DIAGNOSIS — J9600 Acute respiratory failure, unspecified whether with hypoxia or hypercapnia: Secondary | ICD-10-CM

## 2013-04-26 DIAGNOSIS — J441 Chronic obstructive pulmonary disease with (acute) exacerbation: Principal | ICD-10-CM

## 2013-04-26 LAB — RESPIRATORY VIRUS PANEL
Adenovirus: NOT DETECTED
INFLUENZA A H1: NOT DETECTED
INFLUENZA A: NOT DETECTED
Influenza A H3: NOT DETECTED
Influenza B: NOT DETECTED
Metapneumovirus: NOT DETECTED
PARAINFLUENZA 1 A: NOT DETECTED
PARAINFLUENZA 3 A: NOT DETECTED
Parainfluenza 2: NOT DETECTED
Respiratory Syncytial Virus A: NOT DETECTED
Respiratory Syncytial Virus B: NOT DETECTED
Rhinovirus: NOT DETECTED

## 2013-04-26 LAB — BASIC METABOLIC PANEL
BUN: 15 mg/dL (ref 6–23)
CALCIUM: 9.9 mg/dL (ref 8.4–10.5)
CO2: 27 mEq/L (ref 19–32)
Chloride: 102 mEq/L (ref 96–112)
Creatinine, Ser: 0.97 mg/dL (ref 0.50–1.35)
GFR calc Af Amer: >90 mL/min (ref >90)
GFR calc non Af Amer: 85 mL/min — ABNORMAL LOW (ref >90)
Glucose, Bld: 91 mg/dL (ref 70–99)
Potassium: 4.7 mEq/L (ref 3.7–5.3)
Sodium: 142 mEq/L (ref 137–147)

## 2013-04-26 LAB — CBC
HCT: 41.9 % (ref 39.0–52.0)
HEMOGLOBIN: 13.9 g/dL (ref 13.0–17.0)
MCH: 29.8 pg (ref 26.0–34.0)
MCHC: 33.2 g/dL (ref 30.0–36.0)
MCV: 89.9 fL (ref 78.0–100.0)
Platelets: 214 10*3/uL (ref 150–400)
RBC: 4.66 MIL/uL (ref 4.22–5.81)
RDW: 14 % (ref 11.5–15.5)
WBC: 13.1 10*3/uL — ABNORMAL HIGH (ref 4.0–10.5)

## 2013-04-26 LAB — LACTIC ACID, PLASMA: Lactic Acid, Venous: 4.8 mmol/L — ABNORMAL HIGH (ref 0.5–2.2)

## 2013-04-26 LAB — RENAL FUNCTION PANEL
Albumin: 3.4 g/dL — ABNORMAL LOW (ref 3.5–5.2)
BUN: 14 mg/dL (ref 6–23)
CALCIUM: 9.3 mg/dL (ref 8.4–10.5)
CO2: 16 mEq/L — ABNORMAL LOW (ref 19–32)
Chloride: 98 mEq/L (ref 96–112)
Creatinine, Ser: 0.91 mg/dL (ref 0.50–1.35)
GFR calc Af Amer: >90 mL/min (ref >90)
GFR calc non Af Amer: 87 mL/min — ABNORMAL LOW (ref >90)
Glucose, Bld: 121 mg/dL — ABNORMAL HIGH (ref 70–99)
POTASSIUM: 6.1 meq/L — AB (ref 3.7–5.3)
Phosphorus: 3.2 mg/dL (ref 2.3–4.6)
Sodium: 134 mEq/L — ABNORMAL LOW (ref 137–147)

## 2013-04-26 LAB — GLUCOSE, CAPILLARY
Glucose-Capillary: 113 mg/dL — ABNORMAL HIGH (ref 70–99)
Glucose-Capillary: 95 mg/dL (ref 70–99)
Glucose-Capillary: 130 mg/dL — ABNORMAL HIGH (ref 70–99)

## 2013-04-26 LAB — MAGNESIUM: Magnesium: 2.1 mg/dL (ref 1.5–2.5)

## 2013-04-26 MED ORDER — METHYLPREDNISOLONE SODIUM SUCC 40 MG IJ SOLR
40.0000 mg | Freq: Two times a day (BID) | INTRAMUSCULAR | Status: DC
  Administered 2013-04-26 – 2013-04-27 (×2): 40 mg via INTRAVENOUS
  Filled 2013-04-26 (×4): qty 1

## 2013-04-26 NOTE — Progress Notes (Signed)
Name: Jeremiah [name] MRN: [id number] DOB: 21-Jun-1945    ADMISSION DATE:  04/24/2013 CONSULTATION DATE:  1/11  REFERRING MD :  ED PRIMARY SERVICE: Pulm CCM  CHIEF COMPLAINT:  Severe COPD exacerbation  BRIEF PATIENT DESCRIPTION: 68 yo male with abrupt onset of sob 1/11 that progressively worsened until EMS called.  Patient intubated in ED due to severe respiratory distress.  SIGNIFICANT EVENTS / STUDIES:  1/11: CXR: Clear without evidence of pneumonia, intubated, transferred to the ICU, Extubated later in the AM 1/11 after MS improved.   LINES / TUBES: ETT 1/11>>>1/11  CULTURES: Viral panel 1/11>>>  ANTIBIOTICS: Levaquin 1/11>>>  SUBJECTIVE:  Extubated successfully  VITAL SIGNS: Temp:  [97.6 F (36.4 C)-98.1 F (36.7 C)] 97.7 F (36.5 C) (01/12 0803) Pulse Rate:  [68-89] 69 (01/12 0800) Resp:  [16-26] 23 (01/12 0800) BP: (104-130)/(68-96) 130/77 mmHg (01/12 0800) SpO2:  [97 %-100 %] 100 % (01/12 0800) Weight:  [76.6 kg (168 lb 14 oz)] 76.6 kg (168 lb 14 oz) (01/12 0500) HEMODYNAMICS:   VENTILATOR SETTINGS:   INTAKE / OUTPUT: Intake/Output     01/11 0701 - 01/12 0700 01/12 0701 - 01/13 0700   I.V. (mL/kg) 1555 (20.3) 75 (1)   NG/GT     IV Piggyback 150    Total Intake(mL/kg) 1705 (22.3) 75 (1)   Urine (mL/kg/hr) 2580 (1.4) 200 (0.9)   Other     Total Output 2580 200   Net -875 -125        Urine Occurrence 1 x      PHYSICAL EXAMINATION: General: awake, no distress, appropriate Neuro:  Fully awake and oriented. Follows commands, in chair talking full paragraphs, ambulates HEENT:  jvd wnl Cardiovascular:  RRR, no murmurs rubs or gallops, normal S1S2 Lungs:  Apical wheezing Abdomen:  Non tender, non distended, normal bowel sounds, no rigidity Musculoskeletal:  No edema, no evidence of PVD Skin:  No skin break down, no rashes or ecchymosis  LABS:  CBC Recent Labs     04/24/13  2242  04/25/13  0700  04/26/13  0245  WBC  13.7*  13.9*  13.1*  HGB   16.9  14.7  13.9  HCT  50.1  43.2  41.9  PLT  272  198  214    Coag's Recent Labs     04/25/13  0700  04/25/13  0953  APTT   --   27  INR  SPECIMEN CLOTTED  1.03    BMET Recent Labs     04/25/13  0700  04/25/13  0953  04/26/13  0245  NA  141  140  134*  K  5.9*  4.8  6.1*  CL  105  104  98  CO2  21  23  16*  BUN  14  15  14   CREATININE  0.98  0.95  0.91  GLUCOSE  159*  130*  121*    Electrolytes Recent Labs     04/25/13  0700  04/25/13  0953  04/26/13  0245  CALCIUM  9.2  9.3  9.3  MG  2.0  2.0   --   2.1  PHOS  2.8   --   3.2    Sepsis Markers No results found for this basename: LACTICACIDVEN, PROCALCITON, O2SATVEN,  in the last 72 hours  ABG Recent Labs     04/24/13  2224  04/24/13  2308  04/25/13  0416  PHART  7.101*  7.175*  7.440  PCO2ART  85.1*  61.4*  30.2*  PO2ART  243.0*  463.0*  121.0*    Liver Enzymes Recent Labs     04/25/13  0700  04/26/13  0245  ALBUMIN  3.4*  3.4*    Cardiac Enzymes Recent Labs     04/24/13  2243  TROPONINI  <0.30  PROBNP  <5.0    Glucose Recent Labs     04/25/13  0724  04/25/13  1122  04/25/13  1541  04/25/13  1938  04/25/13  2331  04/26/13  0801  GLUCAP  145*  126*  125*  157*  136*  113*    Imaging Dg Chest Portable 1 View  04/24/2013   CLINICAL DATA:  Status post ET tube placement.  EXAM: PORTABLE CHEST - 1 VIEW  COMPARISON:  None.  FINDINGS: ET tube is in place with the tip 3.6 cm above the carina. NG tube courses into the stomach and below the inferior margin of the film. Lungs are clear. Heart size is normal. No pneumothorax or pleural effusion. Remote left 4th and 5th rib fractures are noted.  IMPRESSION: ET tube tip is 3.6 cm with a carina. NG tube tip is below the inferior margin of the film.  Lungs clear.   Electronically Signed   By: Inge Rise M.D.   On: 04/24/2013 22:31      CXR: Patient with ET tube in correct position, no evidence of pulmonary edema or  infiltrate  ASSESSMENT / PLAN:  PULMONARY A:COPD exacerbation with Hypercapnic respiratory failure: Unknown precipitate, possibly viral P:   Scheduled budesonide and brovan Taper steroids to 40 q12h BDers Ambulation pcxr not required Did well also with diuresis  CARDIOVASCULAR A: HTN P:  Dc tele Currently sys 120-130, in am likely restart HCZT home med  RENAL A:   Hyperkalemia (think this is likely due to hemolyzed sample) Lactic acidosis (from resp distress), clinically improved.  P:   Repeat chemistry reviewed, repeat for K hemolyzed? Repeat lactic with continued AG acidosis If lactic acid still elevated will need further work up for etiology kvo  GASTROINTESTINAL A:   GI prophylaxis P:   PPI dc as not home med diet  HEMATOLOGIC A:  Non-issue P:  [place] heparin dc in am if ambulation improved  INFECTIOUS A:  No evidence infection P:   screen for viral etiology - neg No evidence infection, dc levo  ENDOCRINE A:  Non-issue   P:   N/A  NEUROLOGIC A:  Acute encephalopathy resolved P  Supportive care ambulate   Much improved, copd, in chair doing well, to floor  Lavon Paganini. Titus Mould, MD, [place] Pgr: [facility]

## 2013-04-26 NOTE — Progress Notes (Signed)
Pts bipap order is prn only. RT spoke with and aske dif he felt he needed cpap instead and pt stated he wears one at home but wants to try to make it thru the night w/o one.  Rt will continue to monitor.  Pt is currently on ra with sats of 91%.

## 2013-04-26 NOTE — Progress Notes (Signed)
Pt given flutter valve per MD order.  Pt directed on use and diemonstrated clear understanding on use and benefits of use.  Will continue to monitor.

## 2013-04-27 LAB — GLUCOSE, CAPILLARY
GLUCOSE-CAPILLARY: 123 mg/dL — AB (ref 70–99)
GLUCOSE-CAPILLARY: 101 mg/dL — AB (ref 70–99)
GLUCOSE-CAPILLARY: 97 mg/dL (ref 70–99)
Glucose-Capillary: 119 mg/dL — ABNORMAL HIGH (ref 70–99)
Glucose-Capillary: 130 mg/dL — ABNORMAL HIGH (ref 70–99)
Glucose-Capillary: 144 mg/dL — ABNORMAL HIGH (ref 70–99)

## 2013-04-27 LAB — RENAL FUNCTION PANEL
Albumin: 3.2 g/dL — ABNORMAL LOW (ref 3.5–5.2)
BUN: 21 mg/dL (ref 6–23)
CALCIUM: 9.5 mg/dL (ref 8.4–10.5)
CO2: 23 mEq/L (ref 19–32)
Chloride: 102 mEq/L (ref 96–112)
Creatinine, Ser: 0.99 mg/dL (ref 0.50–1.35)
GFR calc non Af Amer: 84 mL/min — ABNORMAL LOW (ref >90)
GLUCOSE: 119 mg/dL — AB (ref 70–99)
PHOSPHORUS: 3.1 mg/dL (ref 2.3–4.6)
Potassium: 4.3 mEq/L (ref 3.7–5.3)
SODIUM: 138 meq/L (ref 137–147)

## 2013-04-27 LAB — CBC
HCT: 42.7 % (ref 39.0–52.0)
HEMOGLOBIN: 14.8 g/dL (ref 13.0–17.0)
MCH: 30.6 pg (ref 26.0–34.0)
MCHC: 34.7 g/dL (ref 30.0–36.0)
MCV: 88.2 fL (ref 78.0–100.0)
PLATELETS: 229 10*3/uL (ref 150–400)
RBC: 4.84 MIL/uL (ref 4.22–5.81)
RDW: 13.9 % (ref 11.5–15.5)
WBC: 14.5 10*3/uL — ABNORMAL HIGH (ref 4.0–10.5)

## 2013-04-27 LAB — MAGNESIUM: Magnesium: 2.3 mg/dL (ref 1.5–2.5)

## 2013-04-27 MED ORDER — PREDNISONE 10 MG PO TABS: ORAL_TABLET | ORAL | Status: DC

## 2013-04-27 NOTE — Progress Notes (Signed)
Nutrition Brief Note  Patient identified on the Malnutrition Screening Tool (MST) Report  Wt Readings from Last 15 Encounters:  04/27/13 176 lb 9.4 oz (80.1 kg)    Body mass index is 23.94 kg/(m^2). Patient meets criteria for Normal Weight based on current BMI. Pt states that he usually weighs 172 lbs and typically eats 1 to 3 meals daily.   Current diet order is Regular, patient is consuming approximately 100% of meals at this time. Encouraged pt to eat 3 good meals daily and to eat protein-rich foods at each meal. Pt reports eating poorly for a few days PTA but, now his appetite is good. Labs and medications reviewed.   No nutrition interventions warranted at this time. If nutrition issues arise, please consult RD.   Pryor Ochoa RD, LDN Inpatient Clinical Dietitian Pager: [phone] After Hours Pager: [phone]

## 2013-04-27 NOTE — Care Management Note (Signed)
    Page 1 of 2   04/27/2013     1:54:03 PM   CARE MANAGEMENT NOTE 04/27/2013  Patient:  Jeremiah [name], Jeremiah [name]   Account Number:  [id number]  Date Initiated:  04/26/2013  Documentation initiated by:  MAYO,HENRIETTA  Subjective/Objective Assessment:   adm with dx of resp failure/COPD exac; lives alone    PCP Dr Niger Reed with [facility]     Action/Plan:   Anticipated DC Date:  04/27/2013   Anticipated DC Plan:  [place]  CM consult      Choice offered to / List presented to:             Status of service:   Medicare Important Message given?   (If response is "NO", the following Medicare IM given date fields will be blank) Date Medicare IM given:   Date Additional Medicare IM given:    Discharge Disposition:    Per UR Regulation:  Reviewed for med. necessity/level of care/duration of stay  If discussed at [place] of Stay Meetings, dates discussed:    Comments:  04-27-13 Spoke with Cecille Rubin at [facility] . Patient is discharging to home today , does not require home oxygen. Lori requesting H and P , and discharge summary be sent to her at [place] , same done.  Lori phone number [phone] ext [phone]  fax [phone]  Patient states he can get his prescriptions filled at [facility] , he does not need assistance with this.  Magdalen Spatz RN BSN [phone]   04/26/13 Sabina RN MSN BSN CCM Notified transfer coordinator @ [facility] of pt's admission.  Pt also was approached about a research study involving Xarelto and wants to talk with his PCP first.

## 2013-04-27 NOTE — Discharge Summary (Signed)
Physician Discharge Summary       Patient ID: Jeremiah [name] MRN: [id number] DOB/AGE: 17-Feb-1946 68 y.o.  Admit date: 04/24/2013 Discharge date: 04/27/2013  Discharge Diagnoses:  Active Problems:   COPD exacerbation  Detailed Hospital Course:      68 yo male with abrupt onset of sob (1/10) that progressively worsened until EMS called. Patient intubated in ED due to severe respiratory distress. In route, patient had increasing tachypnea, decline SpO2, and obtundation. Patient with GCS <8 at arrival, evidence of emesis, intubated for severe respiratory distress. Family reported no evidence of fevers, chills, malaise, worsening coughing, and sob. Initial episode passed, however sister received a call later with him reporting a return of symptoms, complaints of inability of breathing and EMS alerted. Distant history of tobacco abuse. Has never been hospitalized for COPD before this episode. He has had no recent hospitalizations, and has never been previously intubated.    He was admitted to the ICU on 1/11 and was treated with the typical modalities for acute respiratory failure (for presumed AECODP) which included mechanical ventilation with sedation, inhaled bronchodilators, antibiotics, and systemic steroid therapy. He improved later that day and was able to be extubated and sedation was stopped. His care since that point has primarily focused on the de-escalation of the therapies mentioned above. On 1/13 he has returned to a near-baseline state of health and has been deemed a candidate for discharge.     Discharge Plan by diagnoses  Acute exacerbation of COPD  Acute hypercarbic respiratory failure (resolved)  plan -Follow up with Dr. Melvyn Novas on 05/03/2013 at 2:30PM -Change steroids to PO Prednisone, taper over 7-10d  -Resume home dose BDs  Significant Hospital tests/ studies/ interventions and procedures   Viral panel 1/11 > NEGATIVE Chest X-Ray 1/10 > Clear lung fields.    Consults  Discharge Exam: BP 128/74  Pulse 80  Temp(Src) 98.2 F (36.8 C) (Oral)  Resp 16  Ht 6' (1.829 m)  Wt 80.1 kg (176 lb 9.4 oz)  BMI 23.94 kg/m2  SpO2 99%   General: awake, no distress, appropriate  Neuro: Fully awake and oriented. Follows commands, in chair talking full paragraphs, ambulates  HEENT: jvd wnl  Cardiovascular: RRR, no murmurs rubs or gallops, normal S1S2  Lungs: Apical wheezing  Abdomen: Non tender, non distended, normal bowel sounds, no rigidity  Musculoskeletal: No edema, no evidence of PVD  Skin: No skin break down, no rashes or ecchymosis  Labs at discharge Lab Results  Component Value Date   CREATININE 0.99 04/27/2013   BUN 21 04/27/2013   NA 138 04/27/2013   K 4.3 04/27/2013   CL 102 04/27/2013   CO2 23 04/27/2013   Lab Results  Component Value Date   WBC 14.5* 04/27/2013   HGB 14.8 04/27/2013   HCT 42.7 04/27/2013   MCV 88.2 04/27/2013   PLT 229 04/27/2013   No results found for this basename: ALT,  AST,  GGT,  ALKPHOS,  BILITOT   Lab Results  Component Value Date   INR 1.03 04/25/2013   INR SPECIMEN CLOTTED 04/25/2013    Current radiology studies No results found.  Disposition:  Final discharge disposition not confirmed      Discharge Orders   Future Appointments Provider Department Dept Phone   05/03/2013 2:30 PM Tanda Rockers, MD [facility] [phone]   Future Orders Complete By Expires   Diet - low sodium heart healthy  As directed    Increase activity slowly  As directed  Medication List    STOP taking these medications       ALBUTEROL IN      TAKE these medications       albuterol 108 (90 BASE) MCG/ACT inhaler  Commonly known as:  PROVENTIL HFA;VENTOLIN HFA  Inhale 2 puffs into the lungs every 6 (six) hours.     budesonide-formoterol 160-4.5 MCG/ACT inhaler  Commonly known as:  SYMBICORT  Inhale 2 puffs into the lungs 2 (two) times daily.     escitalopram 10 MG tablet  Commonly known as:   LEXAPRO  Take 10 mg by mouth daily.     hydrochlorothiazide 25 MG tablet  Commonly known as:  HYDRODIURIL  Take 25 mg by mouth daily.     hydroxypropyl methylcellulose 2.5 % ophthalmic solution  Commonly known as:  ISOPTO TEARS  Place 2 drops into both eyes 5 (five) times daily as needed for dry eyes. For dry eyes     hydrOXYzine 25 MG tablet  Commonly known as:  ATARAX/VISTARIL  Take 25-50 mg by mouth 3 (three) times daily as needed for itching.     magnesium oxide 400 MG tablet  Commonly known as:  MAG-OX  Take 400 mg by mouth daily. For leg cramps     multivitamin with minerals Tabs tablet  Take 1 tablet by mouth daily.     predniSONE 10 MG tablet  Commonly known as:  DELTASONE  Take 4 tabs  daily with food x 4 days, then 3 tabs daily x 4 days, then 2 tabs daily x 4 days, then 1 tab daily x4 days then stop. #40     tiotropium 18 MCG inhalation capsule  Commonly known as:  SPIRIVA  Place 18 mcg into inhaler and inhale daily.       Follow-up Information   Follow up with Christinia Gully, MD On 05/03/2013. (2:30 PM)    Specialty:  Pulmonary Disease   Contact information:   [address] [address] [phone]       Discharged Condition: good Signed: Corey Harold 04/27/2013, 1:05 PM     Physician Statement:   The Patient was personally examined, the discharge assessment and plan has been personally reviewed and I agree with ACNP Babcock's assessment and plan. > 30 minutes of time have been dedicated to discharge assessment, planning and discharge instructions.    Dr. Brand Males, M.D., [facility].C.P Pulmonary and Critical Care Medicine Staff Physician [facility] Pager: [phone], If no answer or between  15:00h - 7:00h: call [phone]  05/04/2013 11:42 AM

## 2013-04-27 NOTE — Progress Notes (Signed)
Discharge Note. Reviewed discharge instructions and Rx information. Pt ask appropriate questions and shared that he is feeling so much better and looking forward to going home. Pt reported that he doesn't live in town and would need to find a ride home. Pt reported he did not need assistance to get home just that he would make a few calls. Pt ready for discharge.

## 2013-05-03 ENCOUNTER — Encounter: Payer: Self-pay | Admitting: Internal Medicine

## 2013-05-03 ENCOUNTER — Ambulatory Visit (INDEPENDENT_AMBULATORY_CARE_PROVIDER_SITE_OTHER): Payer: Managed Care, Other (non HMO) | Admitting: Internal Medicine

## 2013-05-03 VITALS — BP 106/70 | HR 71 | Temp 97.9°F | Ht 73.5 in | Wt 172.2 lb

## 2013-05-03 DIAGNOSIS — J449 Chronic obstructive pulmonary disease, unspecified: Secondary | ICD-10-CM

## 2013-05-03 NOTE — Patient Instructions (Addendum)
Plan A = Automatic = symbicort 160 Take 2 puffs first thing in am and then another 2 puffs about 12 hours later.                                      Spiriva one capsule each am   Work on perfecting  inhaler technique:  relax and gently blow all the way out then take a nice smooth deep breath back in, triggering the inhaler at same time you start breathing in.  Hold for up to 5 seconds if you can.  Rinse and gargle with water when done.  Plan B = Only use your albuterol as a rescue medication to be used if you can't catch your breath by resting or doing a relaxed purse lip breathing pattern.  - The less you use it, the better it will work when you need it. - Ok to use up to 2 puffs  every 4 hours if you must but call for immediate appointment if use goes up over your usual need - Don't leave home without it !!  (think of it like the spare tire for your car)   Plan C = nebulizer = Albuterol ok to to use up to every 4 hours if needed but need to call right away  Please schedule a follow up office visit in 4 weeks, sooner if needed with pfts on return

## 2013-05-03 NOTE — Progress Notes (Signed)
Subjective:     Patient ID: Jeremiah [name], male   DOB: March 13, 1946   MRN: [id number]  HPI  40 yobm quit smoking 1989 with no problems with variable sob x 2004 and maintained on inhalers referred 05/03/2013 by Dr Jeremiah [name] for evaluation of outpt management for copd after hospitalization.  Admit date: 04/24/2013  Discharge date: 04/27/2013  Discharge Diagnoses:  Active Problems:  COPD exacerbation  Detailed Hospital Course:  68 yo male with abrupt onset of sob (1/10) that progressively worsened until EMS called. Patient intubated in ED due to severe respiratory distress. In route, patient had increasing tachypnea, decline SpO2, and obtundation. Patient with GCS <8 at arrival, evidence of emesis, intubated for severe respiratory distress. Family reported no evidence of fevers, chills, malaise, worsening coughing, and sob. Initial episode passed, however sister received a call later with him reporting a return of symptoms, complaints of inability of breathing and EMS alerted. Distant history of tobacco abuse. Has never been hospitalized for COPD before this episode. He has had no recent hospitalizations, and has never been previously intubated.  He was admitted to the ICU on 1/11 and was treated with the typical modalities for acute respiratory failure (for presumed AECODP) which included mechanical ventilation with sedation, inhaled bronchodilators, antibiotics, and systemic steroid therapy. He improved later that day and was able to be extubated and sedation was stopped. His care since that point has primarily focused on the de-escalation of the therapies mentioned above. On 1/13 he has returned to a near-baseline state of health and has been deemed a candidate for discharge.  Discharge Plan by diagnoses  Acute exacerbation of COPD  Acute hypercarbic respiratory failure (resolved)  plan  -Follow up with Jeremiah [name] on 05/03/2013 at 2:30PM  -Change steroids to PO Prednisone, taper over 7-10d  -Resume home  dose BDs   05/03/2013 1st [place] [facility] office visit/ Jeremiah [name] cc more sob since Oct 2014 on symbicort 160 2 bid and spiriva daily  But still  daily use of proventil and no need for neb then needed neb x sev months prior to his admit above but back to baseline since d/c on pred still tapering off but using saba qid, not prn "out of habit"  No obvious day to day or daytime variabilty or assoc chronic cough or cp or chest tightness, subjective wheeze overt sinus or hb symptoms. No unusual exp hx or h/o childhood pna/ asthma or knowledge of premature birth.  Sleeping ok without nocturnal  or early am exacerbation  of respiratory  c/o's or need for noct saba. Also denies any obvious fluctuation of symptoms with weather or environmental changes or other aggravating or alleviating factors except as outlined above   Current Medications, Allergies, Complete Past Medical History, Past Surgical History, Family History, and Social History were reviewed in [employer] record.  ROS  The following are not active complaints unless bolded sore throat, dysphagia, dental problems, itching, sneezing,  nasal congestion or excess/ purulent secretions, ear ache,   fever, chills, sweats, unintended wt loss, pleuritic or exertional cp, hemoptysis,  orthopnea pnd or leg swelling, presyncope, palpitations, heartburn, abdominal pain, anorexia, nausea, vomiting, diarrhea  or change in bowel or urinary habits, change in stools or urine, dysuria,hematuria,  rash, arthralgias, visual complaints, headache, numbness weakness or ataxia or problems with walking or coordination,  change in mood/affect or memory.     .        Review of Systems     Objective:   Physical  Exam Wt Readings from Last 3 Encounters:  05/03/13 172 lb 3.2 oz (78.109 kg)  04/27/13 176 lb 9.4 oz (80.1 kg)      HEENT mild turbinate edema.  Oropharynx no thrush or excess pnd or cobblestoning.  No JVD or cervical adenopathy. Mild  accessory muscle hypertrophy. Trachea midline, nl thryroid. Chest was hyperinflated by percussion with diminished breath sounds and moderate increased exp time without wheeze. Hoover sign positive at mid inspiration. Regular rate and rhythm without murmur gallop or rub or increase P2 or edema.  Abd: no hsm, nl excursion. Ext warm without cyanosis or clubbing.     04/24/13 ET tube tip is 3.6 cm with a carina. NG tube tip is below the  inferior margin of the film.  Lungs clear.      Assessment:

## 2013-05-04 NOTE — Assessment & Plan Note (Addendum)
  DDX of  difficult airways managment all start with A and  include Adherence, Ace Inhibitors, Acid Reflux, Active Sinus Disease, Alpha 1 Antitripsin deficiency, Anxiety masquerading as Airways dz,  ABPA,  allergy(esp in young), Aspiration (esp in elderly), Adverse effects of DPI,  Active smokers, plus two Bs  = Bronchiectasis and Beta blocker use..and one C= CHF  In this case Adherence is the biggest issue and starts with  inability to use HFA effectively and also  understand that SABA treats the symptoms but doesn't get to the underlying problem (inflammation).  I used  the analogy of putting steroid cream on a rash to help explain the meaning of topical therapy and the need to get the drug to the target tissue.     The proper method of use, as well as anticipated side effects, of a metered-dose inhaler are discussed and demonstrated to the patient. Improved effectiveness after extensive coaching during this visit to a level of approximately  90% so continue symbicort 160 2bid    .

## 2013-06-04 ENCOUNTER — Ambulatory Visit: Payer: Managed Care, Other (non HMO) | Admitting: Internal Medicine

## 2013-10-11 ENCOUNTER — Encounter (HOSPITAL_COMMUNITY): Payer: Self-pay | Admitting: Emergency Medicine

## 2013-10-11 ENCOUNTER — Emergency Department (INDEPENDENT_AMBULATORY_CARE_PROVIDER_SITE_OTHER)
Admission: EM | Admit: 2013-10-11 | Discharge: 2013-10-11 | Disposition: A | Discharge status: Home / Self Care | Payer: Medicare Other | Source: Home / Self Care | Attending: Family Medicine | Admitting: Family Medicine

## 2013-10-11 DIAGNOSIS — J441 Chronic obstructive pulmonary disease with (acute) exacerbation: Secondary | ICD-10-CM | POA: Diagnosis not present

## 2013-10-11 LAB — POCT RAPID STREP A: STREPTOCOCCUS, GROUP A SCREEN (DIRECT): NEGATIVE

## 2013-10-11 MED ORDER — ALBUTEROL SULFATE HFA 108 (90 BASE) MCG/ACT IN AERS: 2.0000 | INHALATION_SPRAY | Freq: Four times a day (QID) | RESPIRATORY_TRACT | Status: DC | PRN

## 2013-10-11 MED ORDER — IPRATROPIUM-ALBUTEROL 0.5-2.5 (3) MG/3ML IN SOLN
3.0000 mL | Freq: Once | RESPIRATORY_TRACT | Status: AC
  Administered 2013-10-11: 3 mL via RESPIRATORY_TRACT

## 2013-10-11 MED ORDER — PREDNISONE 50 MG PO TABS: 50.0000 mg | ORAL_TABLET | Freq: Every day | ORAL | Status: DC

## 2013-10-11 MED ORDER — AZITHROMYCIN 250 MG PO TABS: 250.0000 mg | ORAL_TABLET | Freq: Every day | ORAL | Status: DC

## 2013-10-11 MED ORDER — IPRATROPIUM-ALBUTEROL 0.5-2.5 (3) MG/3ML IN SOLN
RESPIRATORY_TRACT | Status: AC
  Filled 2013-10-11: qty 3

## 2013-10-11 NOTE — ED Provider Notes (Signed)
Jeremiah [name] is a 68 y.o. male who presents to Urgent Care today for cough congestion sore throat hoarse voice and sneezing. The cough is productive. Patient additionally has wheezing and shortness of breath. His symptoms are consistent with prior episodes of COPD exacerbations. He has tried Tylenol which helps some. No nausea vomiting diarrhea fevers or chills.   Past Medical History  Diagnosis Date  . COPD (chronic obstructive pulmonary disease)   . Sleep apnea    History  Substance Use Topics  . Smoking status: Former Smoker -- 1.00 packs/day for 30 years    Types: Cigarettes    Quit date: 04/16/1987  . Smokeless tobacco: Never Used  . Alcohol Use: No   ROS as above Medications: No current facility-administered medications for this encounter.   Current Outpatient Prescriptions  Medication Sig Dispense Refill  . budesonide-formoterol (SYMBICORT) 160-4.5 MCG/ACT inhaler Inhale 2 puffs into the lungs 2 (two) times daily.      . hydrochlorothiazide (HYDRODIURIL) 25 MG tablet Take 25 mg by mouth daily.      [name] Kitchen tiotropium (SPIRIVA) 18 MCG inhalation capsule Place 18 mcg into inhaler and inhale daily.      [name] Kitchen albuterol (PROVENTIL HFA;VENTOLIN HFA) 108 (90 BASE) MCG/ACT inhaler Inhale 2 puffs into the lungs every 6 (six) hours as needed for wheezing or shortness of breath.  1 Inhaler  1  . azithromycin (ZITHROMAX) 250 MG tablet Take 1 tablet (250 mg total) by mouth daily. Take first 2 tablets together, then 1 every day until finished.  6 tablet  0  . escitalopram (LEXAPRO) 10 MG tablet Take 10 mg by mouth daily.      . hydroxypropyl methylcellulose (ISOPTO TEARS) 2.5 % ophthalmic solution Place 2 drops into both eyes 5 (five) times daily as needed for dry eyes. For dry eyes      . hydrOXYzine (ATARAX/VISTARIL) 25 MG tablet Take 25-50 mg by mouth 3 (three) times daily as needed for itching.      . magnesium oxide (MAG-OX) 400 MG tablet Take 400 mg by mouth daily. For leg cramps      .  Multiple Vitamin (MULTIVITAMIN WITH MINERALS) TABS tablet Take 1 tablet by mouth daily.      . predniSONE (DELTASONE) 50 MG tablet Take 1 tablet (50 mg total) by mouth daily.  5 tablet  0    Exam:  BP 154/87  Pulse 58  Temp(Src) 98.1 F (36.7 C) (Oral)  Resp 16  SpO2 96% Gen: Well NAD HEENT: EOMI,  MMM Lungs: Normal work of breathing. Wheezing bilaterally. Heart: RRR no MRG Abd: NABS, Soft. NT, ND Exts: Brisk capillary refill, warm and well perfused.   Patient was given a DuoNeb nebulizer treatment, and felt much better  No results found for this or any previous visit (from the past 24 hour(s)). No results found.  Assessment and Plan: 68 y.o. male with COPD exacerbation. Plan to treat with prednisone albuterol and azithromycin.  Discussed warning signs or symptoms. Please see discharge instructions. Patient expresses understanding.    Gregor Hams, MD 10/11/13 1220

## 2013-10-11 NOTE — ED Notes (Addendum)
Pt c/o cold sx onset 2 weeks Sx include ST, odynophagia, SOB, dry cough Denies f/v/n/d Hx of COPD, sleep apnea Alert w/no signs of acute distress.

## 2013-10-11 NOTE — Discharge Instructions (Signed)
Thank you for coming in today. Take the medication as directed.  Call or go to the emergency room if you get worse, have trouble breathing, have chest pains, or palpitations.   Chronic Obstructive Pulmonary Disease Exacerbation Chronic obstructive pulmonary disease (COPD) is a common lung condition in which airflow from the lungs is limited. COPD is a general term that can be used to describe many different lung problems that limit airflow, including chronic bronchitis and emphysema. COPD exacerbations are episodes when breathing symptoms become much worse and require extra treatment. Without treatment, COPD exacerbations can be life threatening, and frequent COPD exacerbations can cause further damage to your lungs. CAUSES   Respiratory infections.   Exposure to smoke.   Exposure to air pollution, chemical fumes, or dust. Sometimes there is no apparent cause or trigger. RISK FACTORS  Smoking cigarettes.  Older age.  Frequent prior COPD exacerbations. SIGNS AND SYMPTOMS   Increased coughing.   Increased thick spit (sputum) production.   Increased wheezing.   Increased shortness of breath.   Rapid breathing.   Chest tightness. DIAGNOSIS  Your medical history, a physical exam, and tests will help your health care provider make a diagnosis. Tests may include:  A chest X-ray.  Basic lab tests.  Sputum testing.  An arterial blood gas test. TREATMENT  Depending on the severity of your COPD exacerbation, you may need to be admitted to a hospital for treatment. Some of the treatments commonly used to treat COPD exacerbations are:   Antibiotic medicines.   Bronchodilators. These are drugs that expand the air passages. They may be given with an inhaler or nebulizer. Spacer devices may be needed to help improve drug delivery.  Corticosteroid medicines.  Supplemental oxygen therapy.  HOME CARE INSTRUCTIONS   Do not smoke. Quitting smoking is very important to  prevent COPD from getting worse and exacerbations from happening as often.  Avoid exposure to all substances that irritate the airway, especially to tobacco smoke.   If prescribed, take your antibiotics as directed. Finish them even if you start to feel better.  Only take over-the-counter or prescription medicines as directed by your health care provider.It is important to use correct technique with inhaled medicines.  Drink enough fluids to keep your urine clear or pale yellow (unless you have a medical condition that requires fluid restriction).  Use a cool mist vaporizer. This makes it easier to clear your chest when you cough.   If you have a home nebulizer and oxygen, continue to use them as directed.   Maintain all necessary vaccinations to prevent infections.   Exercise regularly.   Eat a healthy diet.   Keep all follow-up appointments as directed by your health care provider. SEEK IMMEDIATE MEDICAL CARE IF:  You have worsening shortness of breath.   You have trouble talking.   You have severe chest pain.  You have blood in your sputum.  You have a fever.  You have weakness, vomit repeatedly, or faint.   You feel confused.   You continue to get worse. MAKE SURE YOU:   Understand these instructions.  Will watch your condition.  Will get help right away if you are not doing well or get worse. Document Released: 01/27/2007 Document Revised: 01/20/2013 Document Reviewed: 12/04/2012 [facility] Patient Information 2015 [place], [place]. This information is not intended to replace advice given to you by your health care provider. Make sure you discuss any questions you have with your health care provider.

## 2013-10-13 LAB — CULTURE, GROUP A STREP

## 2013-11-08 ENCOUNTER — Emergency Department (INDEPENDENT_AMBULATORY_CARE_PROVIDER_SITE_OTHER)
Admission: EM | Admit: 2013-11-08 | Discharge: 2013-11-08 | Disposition: A | Discharge status: Home / Self Care | Payer: 59 | Source: Home / Self Care | Attending: Emergency Medicine | Admitting: Emergency Medicine

## 2013-11-08 ENCOUNTER — Encounter (HOSPITAL_COMMUNITY): Payer: Self-pay | Admitting: Emergency Medicine

## 2013-11-08 DIAGNOSIS — J441 Chronic obstructive pulmonary disease with (acute) exacerbation: Secondary | ICD-10-CM

## 2013-11-08 MED ORDER — PREDNISONE 50 MG PO TABS: 50.0000 mg | ORAL_TABLET | Freq: Every day | ORAL | Status: DC

## 2013-11-08 MED ORDER — OMEPRAZOLE 20 MG PO CPDR: 20.0000 mg | DELAYED_RELEASE_CAPSULE | Freq: Every day | ORAL | Status: DC

## 2013-11-08 MED ORDER — DOXYCYCLINE HYCLATE 100 MG PO CAPS: 100.0000 mg | ORAL_CAPSULE | Freq: Two times a day (BID) | ORAL | Status: DC

## 2013-11-08 NOTE — Discharge Instructions (Signed)
Your COPD is acting up. Take prednisone for 5 days (like before). I changed the antibiotic to doxycycline.  Take 1 pill twice a day for 10 days. Use your rescue inhaler as needed. I would also like you to start omeprazole (an acid blocker) to see if that will help with your nighttime cough.  Follow up with your regular doctor in the next few weeks to discuss adjusting your COPD medications or seeing a specialist.

## 2013-11-08 NOTE — ED Notes (Signed)
PT  REPORTS  SYMPTOMS  OF  COUGH  /  CONGESTED        SNEEZING  -      PT  HAS  A HISTORY  OF  COPD  AND  TAKES  INHALERS       ETC     PT  IN NO  SEVERE    DISTRESS  SITTING    UPRIGHT ON  EXAM TABLE  SPEAKING IN  COMPLETE  SENTANCES

## 2013-11-08 NOTE — ED Provider Notes (Signed)
CSN: [id number]     Arrival date & time 11/08/13  1243 History   First MD Initiated Contact with Patient 11/08/13 1251     Chief Complaint  Patient presents with  . Cough   (Consider location/radiation/quality/duration/timing/severity/associated sxs/prior Treatment) HPI He is here today for evaluation of cough. He reports several days of URI symptoms including cough and congestion. He also reports some increased wheezing. The cough seems to be worse at night. It is nonproductive. He has not had any fevers or shortness of breath. He was seen and treated for a COPD exacerbation about one month ago.  He does also report a history of acid reflux, but not currently taking any medication.  Past Medical History  Diagnosis Date  . COPD (chronic obstructive pulmonary disease)   . Sleep apnea    History reviewed. No pertinent past surgical history. Family History  Problem Relation Age of Onset  . Asthma Maternal Grandmother   . Heart disease Father    History  Substance Use Topics  . Smoking status: Former Smoker -- 1.00 packs/day for 30 years    Types: Cigarettes    Quit date: 04/16/1987  . Smokeless tobacco: Never Used  . Alcohol Use: No    Review of Systems  Constitutional: Negative for fever.  HENT: Positive for congestion and rhinorrhea. Negative for sore throat.   Respiratory: Positive for cough and wheezing. Negative for shortness of breath.   Cardiovascular: Negative.   Gastrointestinal: Negative.     Allergies  Review of patient's allergies indicates no known allergies.  Home Medications   Prior to Admission medications   Medication Sig Start Date End Date Taking? Authorizing Provider  albuterol (PROVENTIL HFA;VENTOLIN HFA) 108 (90 BASE) MCG/ACT inhaler Inhale 2 puffs into the lungs every 6 (six) hours as needed for wheezing or shortness of breath. 10/11/13   Gregor Hams, MD  azithromycin (ZITHROMAX) 250 MG tablet Take 1 tablet (250 mg total) by mouth daily. Take first  2 tablets together, then 1 every day until finished. 10/11/13   Gregor Hams, MD  budesonide-formoterol [facility]) 160-4.5 MCG/ACT inhaler Inhale 2 puffs into the lungs 2 (two) times daily.    Historical Provider, MD  doxycycline (VIBRAMYCIN) 100 MG capsule Take 1 capsule (100 mg total) by mouth 2 (two) times daily. 11/08/13   Melony Overly, MD  escitalopram (LEXAPRO) 10 MG tablet Take 10 mg by mouth daily.    Historical Provider, MD  hydrochlorothiazide (HYDRODIURIL) 25 MG tablet Take 25 mg by mouth daily.    Historical Provider, MD  hydroxypropyl methylcellulose (ISOPTO TEARS) 2.5 % ophthalmic solution Place 2 drops into both eyes 5 (five) times daily as needed for dry eyes. For dry eyes    Historical Provider, MD  hydrOXYzine (ATARAX/VISTARIL) 25 MG tablet Take 25-50 mg by mouth 3 (three) times daily as needed for itching.    Historical Provider, MD  magnesium oxide (MAG-OX) 400 MG tablet Take 400 mg by mouth daily. For leg cramps    Historical Provider, MD  Multiple Vitamin (MULTIVITAMIN WITH MINERALS) TABS tablet Take 1 tablet by mouth daily.    Historical Provider, MD  omeprazole (PRILOSEC) 20 MG capsule Take 1 capsule (20 mg total) by mouth daily. 11/08/13   Melony Overly, MD  predniSONE (DELTASONE) 50 MG tablet Take 1 tablet (50 mg total) by mouth daily. 11/08/13   Melony Overly, MD  tiotropium (SPIRIVA) 18 MCG inhalation capsule Place 18 mcg into inhaler and inhale daily.  Historical Provider, MD   BP 135/78  Pulse 68  Temp(Src) 98.2 F (36.8 C) (Oral)  Resp 20  SpO2 94% Physical Exam  Constitutional: He is oriented to person, place, and time. He appears well-developed and well-nourished. No distress.  HENT:  Head: Normocephalic and atraumatic.  Mouth/Throat: Oropharynx is clear and moist. No oropharyngeal exudate.  Neck: Normal range of motion.  Cardiovascular: Normal rate, regular rhythm and normal heart sounds.   No murmur heard. Pulmonary/Chest: Effort normal. No respiratory  distress. He has wheezes (mild and scattered). He has no rales.  Lymphadenopathy:    He has no cervical adenopathy.  Neurological: He is alert and oriented to person, place, and time.  Skin: Skin is warm and dry.    ED Course  Procedures (including critical care time) Labs Review Labs Reviewed - No data to display  Imaging Review No results found.   MDM   1. COPD exacerbation    Likely from viral URI. Will treat with prednisone and doxycycline. I also prescribed omeprazole to see if that will help with the nighttime cough. Followup with primary care physician in the next few weeks. Recommended discussing either adjusting COPD meds or seeing a specialist given 2 exacerbations in 2 months.    Melony Overly, MD 11/08/13 1335

## 2014-01-03 ENCOUNTER — Ambulatory Visit (INDEPENDENT_AMBULATORY_CARE_PROVIDER_SITE_OTHER): Payer: 59 | Admitting: Adult Health

## 2014-01-03 ENCOUNTER — Encounter: Payer: Self-pay | Admitting: Adult Health

## 2014-01-03 ENCOUNTER — Telehealth: Payer: Self-pay | Admitting: Internal Medicine

## 2014-01-03 ENCOUNTER — Ambulatory Visit (INDEPENDENT_AMBULATORY_CARE_PROVIDER_SITE_OTHER)
Admission: RE | Admit: 2014-01-03 | Discharge: 2014-01-03 | Disposition: A | Discharge status: Home / Self Care | Payer: Medicare Other | Source: Ambulatory Visit | Attending: Adult Health | Admitting: Adult Health

## 2014-01-03 VITALS — BP 112/70 | HR 69 | Temp 98.1°F | Ht 73.5 in | Wt 171.6 lb

## 2014-01-03 DIAGNOSIS — J449 Chronic obstructive pulmonary disease, unspecified: Secondary | ICD-10-CM

## 2014-01-03 DIAGNOSIS — J438 Other emphysema: Secondary | ICD-10-CM | POA: Diagnosis not present

## 2014-01-03 DIAGNOSIS — J4489 Other specified chronic obstructive pulmonary disease: Secondary | ICD-10-CM

## 2014-01-03 DIAGNOSIS — R0789 Other chest pain: Secondary | ICD-10-CM | POA: Diagnosis not present

## 2014-01-03 MED ORDER — PREDNISONE 10 MG PO TABS: ORAL_TABLET | ORAL | Status: DC

## 2014-01-03 MED ORDER — DOXYCYCLINE HYCLATE 100 MG PO TABS: 100.0000 mg | ORAL_TABLET | Freq: Two times a day (BID) | ORAL | Status: DC

## 2014-01-03 NOTE — Assessment & Plan Note (Signed)
Suspected Flare Needs PFT on return  Check cxr today  Will need flu shot on return   Plan  Doxycycline 100mg  Twice daily  For 7 days  Prednisone taper over next week Mucinex DM Twice daily  As needed  Cough /congestion  Continue on Symbicort and Spiriva .  Follow up Dr. Melvyn Novas  In 3 weeks with PFT and As needed   Please contact office for sooner follow up if symptoms do not improve or worsen or seek emergency care  Chest xray today .

## 2014-01-03 NOTE — Progress Notes (Signed)
Subjective:     Patient ID: Jeremiah [name], male   DOB: 1945-08-19   MRN: [id number]  HPI  31 yobm quit smoking 1989 with no problems with variable sob x 2004 and maintained on inhalers referred 05/03/2013 by Dr Titus Mould for evaluation of outpt management for copd after hospitalization.  Admit date: 04/24/2013  Discharge date: 04/27/2013  Discharge Diagnoses:  Active Problems:  COPD exacerbation  Detailed Hospital Course:  68 yo male with abrupt onset of sob (1/10) that progressively worsened until EMS called. Patient intubated in ED due to severe respiratory distress. In route, patient had increasing tachypnea, decline SpO2, and obtundation. Patient with GCS <8 at arrival, evidence of emesis, intubated for severe respiratory distress. Family reported no evidence of fevers, chills, malaise, worsening coughing, and sob. Initial episode passed, however sister received a call later with him reporting a return of symptoms, complaints of inability of breathing and EMS alerted. Distant history of tobacco abuse. Has never been hospitalized for COPD before this episode. He has had no recent hospitalizations, and has never been previously intubated.  He was admitted to the ICU on 1/11 and was treated with the typical modalities for acute respiratory failure (for presumed AECODP) which included mechanical ventilation with sedation, inhaled bronchodilators, antibiotics, and systemic steroid therapy. He improved later that day and was able to be extubated and sedation was stopped. His care since that point has primarily focused on the de-escalation of the therapies mentioned above. On 1/13 he has returned to a near-baseline state of health and has been deemed a candidate for discharge.  Discharge Plan by diagnoses  Acute exacerbation of COPD  Acute hypercarbic respiratory failure (resolved)  plan  -Follow up with Dr. Melvyn Novas on 05/03/2013 at 2:30PM  -Change steroids to PO Prednisone, taper over 7-10d  -Resume home  dose BDs   05/03/2013 1st [facility] office visit/ Wert cc more sob since Oct 2014 on symbicort 160 2 bid and spiriva daily  But still  daily use of proventil and no need for neb then needed neb x sev months prior to his admit above but back to baseline since d/c on pred still tapering off but using saba qid, not prn "out of habit" >>return for PFT   01/03/2014 Follow up  [name] .  Complains of increased episodes of increased DOE, wheezing, prod cough with white/clear mucus worse for last week. Family member says he has been progressively get worse over last year. Was admitted 04/2013 w/ COPD flare /VDRF . [name] from last office visit however did not follow up .   Followed at [facility] , says he was told he has Moderate COPD 6 yrs ago. On Symbicort and Spiriva.  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  Says he gets winded easily esp on incline.  Says worse for last week with increased cough and wheezing.  No hemotpysis, wt loss, calf pain, n/v/d, chest pain or fever.  Last abx ~2 months ago.     Current Medications, Allergies, Complete Past Medical History, Past Surgical History, Family History, and Social History were reviewed in [employer] record.  ROS  The following are not active complaints unless bolded sore throat, dysphagia, dental problems, itching, sneezing,    ear ache,   fever, chills, sweats, unintended wt loss, pleuritic or exertional cp, hemoptysis,  orthopnea pnd or leg swelling,  presyncope, palpitations, heartburn, abdominal pain, anorexia, nausea, vomiting, diarrhea  or change in bowel or urinary habits, change in stools or urine, dysuria,hematuria,  rash, arthralgias, visual complaints, headache, numbness weakness or ataxia or problems with walking or coordination,  change in mood/affect or memory.     .      Objective:        Physical Exam GEN: A/Ox3; pleasant , NAD, elderly , thin   HEENT:  [place]/AT,  EACs-clear, TMs-wnl, NOSE-clear, THROAT-clear, no lesions, no postnasal drip or exudate noted.   NECK:  Supple w/ fair ROM; no JVD; normal carotid impulses w/o bruits; no thyromegaly or nodules palpated; no lymphadenopathy.  RESP  Exp wheezing bilaterally , no stridor, talks in complete sentences. no accessory muscle use, no dullness to percussion  CARD:  RRR, no m/r/g  , no peripheral edema, pulses intact, no cyanosis or clubbing.  GI:   Soft & nt; nml bowel sounds; no organomegaly or masses detected.  Musco: Warm bil, no deformities or joint swelling noted.   Neuro: alert, no focal deficits noted.    Skin: Warm, no lesions or rashes     04/24/13 ET tube tip is 3.6 cm with a carina. NG tube tip is below the  inferior margin of the film.  Lungs clear.      Assessment:

## 2014-01-03 NOTE — Telephone Encounter (Signed)
I called pt. He is currently on the phone and will call me back.

## 2014-01-03 NOTE — Telephone Encounter (Signed)
Pt is c/o having increased SOB that is progressively getting worse. They went to pulm rehab this AM but was advised they need to set an appt. They normally go to the [place] but cannot make it there today and are requesting an appt here. Appt set for today at 3:15 with TP. Cheney Bing, CMA

## 2014-01-03 NOTE — Patient Instructions (Addendum)
Doxycycline 100mg  Twice daily  For 7 days  Prednisone taper over next week Mucinex DM Twice daily  As needed  Cough /congestion  Continue on Symbicort and Spiriva .  Follow up Dr. Melvyn Novas  In 3 weeks with PFT and As needed   Please contact office for sooner follow up if symptoms do not improve or worsen or seek emergency care  Chest xray today .

## 2014-01-04 ENCOUNTER — Telehealth: Payer: Self-pay | Admitting: Adult Health

## 2014-01-04 ENCOUNTER — Other Ambulatory Visit: Payer: Self-pay | Admitting: Adult Health

## 2014-01-04 ENCOUNTER — Telehealth: Payer: Self-pay | Admitting: Internal Medicine

## 2014-01-04 NOTE — Progress Notes (Signed)
Chart/ ov note reviewed and agree with a/p

## 2014-01-04 NOTE — Telephone Encounter (Signed)
Pt seen by TP 9/21 and spouse reported that they had been in touch with Pulmonary Rehab - pt is supposed to be worked in on Friday 9/25 but pt had not heard anything.  ???  LMOM TCB x1 for pulmonary rehab. Will hold in my box

## 2014-01-04 NOTE — Telephone Encounter (Signed)
lmomtcb x1 

## 2014-01-04 NOTE — Telephone Encounter (Signed)
We will make the referral depending on the results of his pfts at next ov

## 2014-01-04 NOTE — Telephone Encounter (Signed)
Called made pt aware of recs. Nothing further needed 

## 2014-01-04 NOTE — Telephone Encounter (Signed)
Pt returning call can be reached @ [phone].Jeremiah [name]

## 2014-01-04 NOTE — Telephone Encounter (Signed)
Called spoke with spouse. She reports the [place] is stating pt needs to be in pulm rehab. Pt can;t drive to [place] daily so he is wanting to get set up here. Pt has pending PFT's 01/24/14. Please advise MW thanks

## 2014-01-05 NOTE — Telephone Encounter (Signed)
Per other 9.22.15 phone note, MW wants to wait for the pending PFT results. Will sign off.

## 2014-01-05 NOTE — Progress Notes (Signed)
Quick Note:  Called spoke with patient, advised of cxr results / recs as stated by TP. Pt verbalized his understanding and denied any questions. ______ 

## 2014-01-24 ENCOUNTER — Ambulatory Visit: Payer: Medicare Other | Admitting: Internal Medicine

## 2014-01-24 ENCOUNTER — Encounter: Payer: Medicare Other | Admitting: Internal Medicine

## 2014-01-24 ENCOUNTER — Encounter: Payer: Self-pay | Admitting: Internal Medicine

## 2014-01-24 ENCOUNTER — Ambulatory Visit (INDEPENDENT_AMBULATORY_CARE_PROVIDER_SITE_OTHER): Payer: 59 | Admitting: Internal Medicine

## 2014-01-24 VITALS — BP 122/70 | HR 74 | Temp 98.4°F | Ht 72.0 in | Wt 173.0 lb

## 2014-01-24 DIAGNOSIS — J449 Chronic obstructive pulmonary disease, unspecified: Secondary | ICD-10-CM

## 2014-01-24 DIAGNOSIS — Z23 Encounter for immunization: Secondary | ICD-10-CM

## 2014-01-24 LAB — PULMONARY FUNCTION TEST
DL/VA % PRED: 60 %
DL/VA: 2.86 ml/min/mmHg/L
DLCO unc % pred: 48 %
DLCO unc: 17.16 ml/min/mmHg
FEF 25-75 POST: 0.56 L/s
FEF 25-75 PRE: 0.61 L/s
FEF2575-%CHANGE-POST: -8 %
FEF2575-%PRED-POST: 19 %
FEF2575-%PRED-PRE: 21 %
FEV1-%Change-Post: -3 %
FEV1-%Pred-Post: 34 %
FEV1-%Pred-Pre: 35 %
FEV1-POST: 1.26 L
FEV1-PRE: 1.3 L
FEV1FVC-%CHANGE-POST: -6 %
FEV1FVC-%Pred-Pre: 66 %
FEV6-%Change-Post: 4 %
FEV6-%Pred-Post: 57 %
FEV6-%Pred-Pre: 55 %
FEV6-PRE: 2.59 L
FEV6-Post: 2.69 L
FEV6FVC-%Change-Post: 0 %
FEV6FVC-%Pred-Post: 103 %
FEV6FVC-%Pred-Pre: 102 %
FVC-%Change-Post: 3 %
FVC-%Pred-Post: 56 %
FVC-%Pred-Pre: 54 %
FVC-Post: 2.75 L
FVC-Pre: 2.66 L
POST FEV1/FVC RATIO: 46 %
POST FEV6/FVC RATIO: 98 %
PRE FEV1/FVC RATIO: 49 %
Pre FEV6/FVC Ratio: 97 %
RV % pred: 306 %
RV: 7.59 L
TLC % pred: 151 %
TLC: 11.28 L

## 2014-01-24 MED ORDER — PREDNISONE 10 MG PO TABS: ORAL_TABLET | ORAL | Status: DC

## 2014-01-24 NOTE — Progress Notes (Signed)
Subjective:     Patient ID: Jeremiah [name], male   DOB: 1945/06/03   MRN: [id number]    Brief patient profile:  67 yobm quit smoking 1989 with no problems with variable sob x 2004 and maintained on inhalers referred 05/03/2013 by Dr Titus Mould for evaluation of outpt management for copd after hospitalization.  Admit date: 04/24/2013  Discharge date: 04/27/2013  Discharge Diagnoses:  Active Problems:  COPD exacerbation  Detailed Hospital Course:  68 yo male with abrupt onset of sob (1/10) that progressively worsened until EMS called. Patient intubated in ED due to severe respiratory distress. In route, patient had increasing tachypnea, decline SpO2, and obtundation. Patient with GCS <8 at arrival, evidence of emesis, intubated for severe respiratory distress. Family reported no evidence of fevers, chills, malaise, worsening coughing, and sob. Initial episode passed, however sister received a call later with him reporting a return of symptoms, complaints of inability of breathing and EMS alerted. Distant history of tobacco abuse. Has never been hospitalized for COPD before this episode. He has had no recent hospitalizations, and has never been previously intubated.  He was admitted to the ICU on 1/11 and was treated with the typical modalities for acute respiratory failure (for presumed AECODP) which included mechanical ventilation with sedation, inhaled bronchodilators, antibiotics, and systemic steroid therapy. He improved later that day and was able to be extubated and sedation was stopped. His care since that point has primarily focused on the de-escalation of the therapies mentioned above. On 1/13 he has returned to a near-baseline state of health and has been deemed a candidate for discharge.  Discharge Plan by diagnoses  Acute exacerbation of COPD  Acute hypercarbic respiratory failure (resolved)  plan  -Follow up with Dr. Melvyn Novas on 05/03/2013 at 2:30PM  -Change steroids to PO Prednisone, taper over  7-10d  -Resume home dose BDs    History of Present Illness  05/03/2013 1st [facility] office visit/ Jeremiah [name] cc more sob since Oct 2014 on symbicort 160 2 bid and spiriva daily  But still  daily use of proventil and no need for neb then needed neb x sev months prior to his admit above but back to baseline since d/c on pred still tapering off but using saba qid, not prn "out of habit" >>return for PFT   01/03/2014 Follow up  [name] .  Complains of increased episodes of increased DOE, wheezing, prod cough with white/clear mucus worse for last week. Family member says he has been progressively get worse over last year. Was admitted 04/2013 w/ COPD flare /VDRF . [name] from last office visit however did not follow up .   Followed at [facility] , says he was told he has Moderate COPD 6 yrs ago. On Symbicort and Spiriva.  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  Says he gets winded easily esp on incline.  Says worse for last week with increased cough and wheezing.  No hemotpysis, wt loss, calf pain, n/v/d, chest pain or fever.  Last abx ~2 months ago.  rec Doxycycline 100mg  Twice daily  For 7 days  Prednisone taper over next week Mucinex DM Twice daily  As needed  Cough /congestion  Continue on Symbicort and Spiriva .   01/24/2014 f/u ov/Jeremiah [name] re:  GOLD III Chief Complaint  Patient presents with  . Follow-up    PFT done today. Pt states  that SOB and cough have improved some, but not back to normal baseline. Cough is prod with minimal clear sputum.   can do HT but no mall due to sob Prednisone really helped a lot but did not maintain improvement once finished  No obvious day to day or daytime variabilty or assoc  Excess/ purulent mucus or cp or chest tightness, subjective wheeze overt sinus or hb symptoms. No unusual exp hx or h/o childhood pna/ asthma or knowledge of premature  birth.  Sleeping ok without nocturnal  or early am exacerbation  of respiratory  c/o's or need for noct saba. Also denies any obvious fluctuation of symptoms with weather or environmental changes or other aggravating or alleviating factors except as outlined above   Current Medications, Allergies, Complete Past Medical History, Past Surgical History, Family History, and Social History were reviewed in [employer] record.  ROS  The following are not active complaints unless bolded sore throat, dysphagia, dental problems, itching, sneezing,  nasal congestion or excess/ purulent secretions, ear ache,   fever, chills, sweats, unintended wt loss, pleuritic or exertional cp, hemoptysis,  orthopnea pnd or leg swelling, presyncope, palpitations, heartburn, abdominal pain, anorexia, nausea, vomiting, diarrhea  or change in bowel or urinary habits, change in stools or urine, dysuria,hematuria,  rash, arthralgias, visual complaints, headache, numbness weakness or ataxia or problems with walking or coordination,  change in mood/affect or memory.                 .      Objective:       Physical Exam GEN: A/Ox3; pleasant , NAD, elderly , thin   Wt Readings from Last 3 Encounters:  01/24/14 173 lb (78.472 kg)  01/03/14 171 lb 9.6 oz (77.837 kg)  05/03/13 172 lb 3.2 oz (78.109 kg)      HEENT:  [place]/AT,  EACs-clear, TMs-wnl, NOSE-clear, THROAT-clear, no lesions, no postnasal drip or exudate noted.   NECK:  Supple w/ fair ROM; no JVD; normal carotid impulses w/o bruits; no thyromegaly or nodules palpated; no lymphadenopathy.  RESP  Exp wheezing bilaterally , no stridor, talks in complete sentences. no accessory muscle use, no dullness to percussion  CARD:  RRR, no m/r/g  , no peripheral edema, pulses intact, no cyanosis or clubbing.  GI:   Soft & nt; nml bowel sounds; no organomegaly or masses detected.  Musco: Warm bil, no deformities or joint swelling noted.   Neuro:  alert, no focal deficits noted.    Skin: Warm, no lesions or rashes     01/03/14  cxr No active cardiopulmonary process. Stable changes of emphysema.       Assessment:

## 2014-01-24 NOTE — Progress Notes (Signed)
PFT done today. 

## 2014-01-24 NOTE — Patient Instructions (Addendum)
Continue pantoprazole 40 mg Take 30- 60 min before your first and last meals of the day   GERD (REFLUX)  is an extremely common cause of respiratory symptoms, many times with no significant heartburn at all.    It can be treated with medication, but also with lifestyle changes including avoidance of late meals, excessive alcohol, smoking cessation, and avoid fatty foods, chocolate, peppermint, colas, red wine, and acidic juices such as orange juice.  NO MINT OR MENTHOL PRODUCTS SO NO COUGH DROPS  USE SUGARLESS CANDY INSTEAD (jolley ranchers or Stover's)  NO OIL BASED VITAMINS - use powdered substitutes.    Prednisone 10 Take 4 for two days three for two days two for two days one for two days   Please see patient coordinator before you leave today  to schedule rehab referral  See Tammy NP  4  weeks with all your medications, even over the counter meds, separated in two separate bags, the ones you take no matter what vs the ones you stop once you feel better and take only as needed when you feel you need them.   Tammy  will generate for you a new user friendly medication calendar that will put [place] all on the same page re: your medication use.     Without this process, it simply isn't possible to assure that we are providing  your outpatient care  with  the attention to detail we feel you deserve.   If we cannot assure that you're getting that kind of care,  then we cannot manage your problem effectively from this clinic.  Once you have seen Tammy and we are sure that we're all on the same page with your medication use she will arrange follow up with me.

## 2014-01-25 ENCOUNTER — Encounter: Payer: Self-pay | Admitting: Internal Medicine

## 2014-01-25 NOTE — Assessment & Plan Note (Signed)
-   PFTs 01/24/2014  FEV1  1.30 (35%) ratio 49 with severe air trapping and no better p saba and dlco 48%  DDX of  difficult airways management all start with A and  include Adherence, Ace Inhibitors, Acid Reflux, Active Sinus Disease, Alpha 1 Antitripsin deficiency, Anxiety masquerading as Airways dz,  ABPA,  allergy(esp in young), Aspiration (esp in elderly), Adverse effects of DPI,  Active smokers, plus two Bs  = Bronchiectasis and Beta blocker use..and one C= CHF  Adherence is always the initial "prime suspect" and is a multilayered concern that requires a "trust but verify" approach in every patient - starting with knowing how to use medications, especially inhalers, correctly, keeping up with refills and understanding the fundamental difference between maintenance and prns vs those medications only taken for a very short course and then stopped and not refilled.  - The proper method of use, as well as anticipated side effects, of a metered-dose inhaler are discussed and demonstrated to the patient. Improved effectiveness after extensive coaching during this visit to a level of approximately  90% from a baseline of 50% so should do well on symbicort if he maintains good technique   ? Acid (or non-acid) GERD > always difficult to exclude as up to 75% of pts in some series report no assoc GI/ Heartburn symptoms> rec continue max (24h)  acid suppression and diet restrictions/ reviewed

## 2014-02-01 ENCOUNTER — Telehealth (HOSPITAL_COMMUNITY): Payer: Self-pay

## 2014-02-01 NOTE — Telephone Encounter (Signed)
Called patient regarding entrance to Pulmonary Rehab.  Patient states that they are interested in attending the program.  Jeremiah [name] is going to verify insurance coverage and follow up.    

## 2014-02-14 ENCOUNTER — Encounter (HOSPITAL_COMMUNITY): Payer: Self-pay

## 2014-02-14 ENCOUNTER — Encounter (HOSPITAL_COMMUNITY)
Admission: RE | Admit: 2014-02-14 | Discharge: 2014-02-14 | Disposition: A | Discharge status: Home / Self Care | Payer: 59 | Source: Ambulatory Visit | Attending: Internal Medicine | Admitting: Internal Medicine

## 2014-02-14 VITALS — BP 134/89 | HR 62 | Resp 18 | Ht 73.0 in | Wt 174.4 lb

## 2014-02-14 DIAGNOSIS — J439 Emphysema, unspecified: Secondary | ICD-10-CM | POA: Diagnosis present

## 2014-02-14 DIAGNOSIS — J438 Other emphysema: Secondary | ICD-10-CM

## 2014-02-14 HISTORY — DX: Major depressive disorder, single episode, unspecified: F32.9

## 2014-02-14 HISTORY — DX: Essential (primary) hypertension: I10

## 2014-02-14 HISTORY — DX: Depression, unspecified: F32.A

## 2014-02-14 HISTORY — DX: Post-traumatic stress disorder, unspecified: F43.10

## 2014-02-15 ENCOUNTER — Encounter (HOSPITAL_COMMUNITY)
Admission: RE | Admit: 2014-02-15 | Discharge: 2014-02-15 | Disposition: A | Discharge status: Home / Self Care | Payer: 59 | Source: Ambulatory Visit | Attending: Internal Medicine | Admitting: Internal Medicine

## 2014-02-15 DIAGNOSIS — J439 Emphysema, unspecified: Secondary | ICD-10-CM | POA: Diagnosis not present

## 2014-02-15 NOTE — Progress Notes (Signed)
Jeremiah [name] completed a Six-Minute Walk Test on 02/15/14 . Jeremiah [name] walked 741 feet with 0 breaks.  The patient's lowest oxygen saturation was 91% , highest heart rate was 80 , and highest blood pressure was 136/100. The patient was on room air. Jeremiah [name] stated that nothing hindered their walk test.

## 2014-02-15 NOTE — Progress Notes (Signed)
Jeremiah [name] 68 y.o. male Pulmonary Rehab Orientation Note Patient arrived today in [facility] for orientation to Pulmonary Rehab. He was transported from [employer] via wheel chair. He does not carry portable oxygen. Per pt, he uses oxygen never. Color good, skin warm and dry. Patient is oriented to time and place. Patient's medical history and medications reviewed. Heart rate is normal, breath sounds mild-moderate expiratory wheezing heard diffusely throughout both lungs. Grip strength equal, strong. Distal pulses palpable. No edema noted. Patient reports he does take medications as prescribed, however he is using his nebulizer and rescue inhaler daily for SOB. We discussed PLB in great detail, and importance of use when he is SOB vs always utilizing nebulizer AND rescue inhaler. Patient states he follows a Regular diet. The patient reports no specific efforts to gain or lose weight. Patient's weight will be monitored closely. Demonstration and practice of PLB using pulse oximeter. Patient able to return demonstration satisfactorily. Safety and hand hygiene in the exercise area reviewed with patient. Patient voices understanding of the information reviewed. Department expectations discussed with patient and achievable goals were set. The patient shows enthusiasm about attending the program and we look forward to working with this nice gentleman. The patient is scheduled for a 6 min walk test on Tuesday 11/3 at 3:30 and to begin exercise on Thursday 11/5 at 71.   45 minutes was spent on a variety of activities such as assessment of the patient, obtaining baseline data including height, weight, BMI, and grip strength, verifying medical history, allergies, and current medications, and teaching patient strategies for performing tasks with less respiratory effort with emphasis on pursed lip breathing.

## 2014-02-15 NOTE — Progress Notes (Signed)
Will presented today to the pulmonary rehabilitation department for his 6 minute walk test. Upon arrival to the department, his BP was 160/100. He stated that he has left something in his car and had to walk back to the parking deck to retrieve it prior to his appointment. After a 5 minute sitting rest period, his BP decreased to 138/84. He completed his walk test without complaints. His standing post ambulatory pressure was 136/100 and recovered to 142/84 at discharge. RN suggested pt see his primary care MD at the [facility] before he begins his biweekly exercise regimen. Message left on CMA voicemail for Dr. Joneen Caraway. Patient also educated on a low sodium diet and handouts given. Will contact patient once I have spoken with the [place].

## 2014-02-17 ENCOUNTER — Telehealth (HOSPITAL_COMMUNITY): Payer: Self-pay

## 2014-02-17 ENCOUNTER — Encounter (HOSPITAL_COMMUNITY)
Admission: RE | Admit: 2014-02-17 | Discharge: 2014-02-17 | Disposition: A | Discharge status: Home / Self Care | Payer: 59 | Source: Ambulatory Visit | Attending: Internal Medicine | Admitting: Internal Medicine

## 2014-02-17 DIAGNOSIS — J439 Emphysema, unspecified: Secondary | ICD-10-CM | POA: Diagnosis not present

## 2014-02-17 NOTE — Progress Notes (Signed)
Today, Jeremiah [name] exercised at [facility]. Service time was from 10:30am to 12:30pm.  The patient exercised for more than 31 minutes performing aerobic, strengthening, and stretching exercises. Oxygen saturation, heart rate, blood pressure, rate of perceived exertion, and shortness of breath were all monitored before, during, and after exercise. Jeremiah [name] presented with no problems at today's exercise session. Patient attended education with Lambert Mody on [employer].  There was no workload change during today's exercise session.  Pre-exercise vitals: . Weight kg: 79.1 . Liters of O2: ra . SpO2: 94 . HR: 67 . BP: 122/80 . CBG: na  Exercise vitals: . Highest heartrate:  71 . Lowest oxygen saturation: 93 . Highest blood pressure: 132/90 . Liters of 02: ra  Post-exercise vitals: . SpO2: 94 . HR: 65 . BP: 104/72 . Liters of O2: ra . CBG: na  Dr. Brand Males, Medical Director Dr. Wynelle Cleveland is immediately available during today's Pulmonary Rehab session for Jeremiah [name] on 02/17/14 at 10:30am class time.

## 2014-02-17 NOTE — Telephone Encounter (Signed)
Called and spoke with Mardene Celeste, Medical Assistant to Dr. Joneen Caraway at the [facility]. Informed her of Mr. Laforte's elevated BP during exercise. Awaiting return phone call from Dr. Darden Dates nurse.

## 2014-02-21 ENCOUNTER — Encounter: Payer: Self-pay | Admitting: Adult Health

## 2014-02-21 ENCOUNTER — Ambulatory Visit (INDEPENDENT_AMBULATORY_CARE_PROVIDER_SITE_OTHER): Payer: 59 | Admitting: Adult Health

## 2014-02-21 VITALS — BP 126/80 | HR 70 | Temp 96.8°F | Ht 73.5 in | Wt 178.4 lb

## 2014-02-21 DIAGNOSIS — J449 Chronic obstructive pulmonary disease, unspecified: Secondary | ICD-10-CM

## 2014-02-21 NOTE — Patient Instructions (Signed)
Continue on Symbicort and Spiriva .  Follow up Dr. Melvyn Novas  In 3 months and As needed   Please contact office for sooner follow up if symptoms do not improve or worsen or seek emergency care

## 2014-02-21 NOTE — Progress Notes (Signed)
Subjective:     Patient ID: Jeremiah [name], male   DOB: 1945/06/03   MRN: [id number]    Brief patient profile:  67 yobm quit smoking 1989 with no problems with variable sob x 2004 and maintained on inhalers referred 05/03/2013 by Dr Titus Mould for evaluation of outpt management for copd after hospitalization.  Admit date: 04/24/2013  Discharge date: 04/27/2013  Discharge Diagnoses:  Active Problems:  COPD exacerbation  Detailed Hospital Course:  68 yo male with abrupt onset of sob (1/10) that progressively worsened until EMS called. Patient intubated in ED due to severe respiratory distress. In route, patient had increasing tachypnea, decline SpO2, and obtundation. Patient with GCS <8 at arrival, evidence of emesis, intubated for severe respiratory distress. Family reported no evidence of fevers, chills, malaise, worsening coughing, and sob. Initial episode passed, however sister received a call later with him reporting a return of symptoms, complaints of inability of breathing and EMS alerted. Distant history of tobacco abuse. Has never been hospitalized for COPD before this episode. He has had no recent hospitalizations, and has never been previously intubated.  He was admitted to the ICU on 1/11 and was treated with the typical modalities for acute respiratory failure (for presumed AECODP) which included mechanical ventilation with sedation, inhaled bronchodilators, antibiotics, and systemic steroid therapy. He improved later that day and was able to be extubated and sedation was stopped. His care since that point has primarily focused on the de-escalation of the therapies mentioned above. On 1/13 he has returned to a near-baseline state of health and has been deemed a candidate for discharge.  Discharge Plan by diagnoses  Acute exacerbation of COPD  Acute hypercarbic respiratory failure (resolved)  plan  -Follow up with Dr. Melvyn Novas on 05/03/2013 at 2:30PM  -Change steroids to PO Prednisone, taper over  7-10d  -Resume home dose BDs    History of Present Illness  05/03/2013 1st [facility] office visit/ Wert cc more sob since Oct 2014 on symbicort 160 2 bid and spiriva daily  But still  daily use of proventil and no need for neb then needed neb x sev months prior to his admit above but back to baseline since d/c on pred still tapering off but using saba qid, not prn "out of habit" >>return for PFT   01/03/2014 Follow up  [name] .  Complains of increased episodes of increased DOE, wheezing, prod cough with white/clear mucus worse for last week. Family member says he has been progressively get worse over last year. Was admitted 04/2013 w/ COPD flare /VDRF . [name] from last office visit however did not follow up .   Followed at [facility] , says he was told he has Moderate COPD 6 yrs ago. On Symbicort and Spiriva.  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  Says he gets winded easily esp on incline.  Says worse for last week with increased cough and wheezing.  No hemotpysis, wt loss, calf pain, n/v/d, chest pain or fever.  Last abx ~2 months ago.  rec Doxycycline 100mg  Twice daily  For 7 days  Prednisone taper over next week Mucinex DM Twice daily  As needed  Cough /congestion  Continue on Symbicort and Spiriva .   01/24/2014 f/u ov/Wert re:  GOLD III Chief Complaint  Patient presents with  . Follow-up    PFT done today. Pt states  that SOB and cough have improved some, but not back to normal baseline. Cough is prod with minimal clear sputum.   can do HT but no mall due to sob Prednisone really helped a lot but did not maintain improvement once finished >>pred taper   02/21/2014 Follow up and Medication Review  Patient returns for follow-up and medication review. We reviewed all his medications organize them into a medication count with patient education. It appears that he  is take his medications correctly. Has begun pulmonary rehab, really likes it.  Patient has COPD flare last time was treated with prednisone. Feels that he did help. He denies any chest pain, orthopnea, PND or leg swelling  Current Medications, Allergies, Complete Past Medical History, Past Surgical History, Family History, and Social History were reviewed in [employer] record.  ROS  The following are not active complaints unless bolded sore throat, dysphagia, dental problems, itching, sneezing,  nasal congestion or excess/ purulent secretions, ear ache,   fever, chills, sweats, unintended wt loss, pleuritic or exertional cp, hemoptysis,  orthopnea pnd or leg swelling, presyncope, palpitations, heartburn, abdominal pain, anorexia, nausea, vomiting, diarrhea  or change in bowel or urinary habits, change in stools or urine, dysuria,hematuria,  rash, arthralgias, visual complaints, headache, numbness weakness or ataxia or problems with walking or coordination,  change in mood/affect or memory.                 .      Objective:       Physical Exam GEN: A/Ox3; pleasant , NAD, elderly , thin    HEENT:  [place]/AT,  EACs-clear, TMs-wnl, NOSE-clear, THROAT-clear, no lesions, no postnasal drip or exudate noted.   NECK:  Supple w/ fair ROM; no JVD; normal carotid impulses w/o bruits; no thyromegaly or nodules palpated; no lymphadenopathy.  RESP  Exp wheezing bilaterally , no stridor, talks in complete sentences. no accessory muscle use, no dullness to percussion  CARD:  RRR, no m/r/g  , no peripheral edema, pulses intact, no cyanosis or clubbing.  GI:   Soft & nt; nml bowel sounds; no organomegaly or masses detected.  Musco: Warm bil, no deformities or joint swelling noted.   Neuro: alert, no focal deficits noted.    Skin: Warm, no lesions or rashes     01/03/14  cxr No active cardiopulmonary process. Stable changes of emphysema.       Assessment:

## 2014-02-22 ENCOUNTER — Encounter (HOSPITAL_COMMUNITY)
Admission: RE | Admit: 2014-02-22 | Discharge: 2014-02-22 | Disposition: A | Discharge status: Home / Self Care | Payer: 59 | Source: Ambulatory Visit | Attending: Internal Medicine | Admitting: Internal Medicine

## 2014-02-22 DIAGNOSIS — J439 Emphysema, unspecified: Secondary | ICD-10-CM | POA: Diagnosis not present

## 2014-02-22 NOTE — Progress Notes (Signed)
Today, Jeremiah [name] exercised at [facility] [name]. Service time was from 10:30am to 12:15pm.  The patient exercised for more than 31 minutes performing aerobic, strengthening, and stretching exercises. Oxygen saturation, heart rate, blood pressure, rate of perceived exertion, and shortness of breath were all monitored before, during, and after exercise. Jeremiah [name] presented with no problems at today's exercise session.   There was an increase in workload change during today's exercise session.  Pre-exercise vitals: . Weight kg: 79.4 . Liters of O2: ra . SpO2: 94 . HR: 69 . BP: 114/60 . CBG: na  Exercise vitals: . Highest heartrate:  78 . Lowest oxygen saturation: 92 . Highest blood pressure: 122/70 . Liters of 02: ra  Post-exercise vitals: . SpO2: 93 . HR: 69 . BP: 118/72 . Liters of O2: ra . CBG: na  Dr. Brand Males, Medical Director Dr. Wynelle Cleveland is immediately available during today's Pulmonary Rehab session for Jeremiah [name] on 02/22/14 at 10:30am class time.

## 2014-02-24 ENCOUNTER — Encounter (HOSPITAL_COMMUNITY)
Admission: RE | Admit: 2014-02-24 | Discharge: 2014-02-24 | Disposition: A | Discharge status: Home / Self Care | Payer: 59 | Source: Ambulatory Visit | Attending: Internal Medicine | Admitting: Internal Medicine

## 2014-02-24 DIAGNOSIS — J439 Emphysema, unspecified: Secondary | ICD-10-CM | POA: Diagnosis not present

## 2014-02-24 NOTE — Progress Notes (Signed)
Today, Jeremiah [name] exercised at [facility]. Service time was from 10:30am to 12:30pm.  The patient exercised for more than 31 minutes performing aerobic, strengthening, and stretching exercises. Oxygen saturation, heart rate, blood pressure, rate of perceived exertion, and shortness of breath were all monitored before, during, and after exercise. Jeremiah [name] presented with no problems at today's exercise session. Patient attended education class today with Trish Fountain on Oxygen Use and Safety.  There was no workload change during today's exercise session.  Pre-exercise vitals: . Weight kg: 79.4 . Liters of O2: ra . SpO2: 97 . HR: 63 . BP: 104/68 . CBG: na  Exercise vitals: . Highest heartrate:  84 . Lowest oxygen saturation: 94 . Highest blood pressure: 140/72 . Liters of 02: ra  Post-exercise vitals: . SpO2: 94 . HR: 72 . BP: 106/86 . Liters of O2: ra . CBG: na  Dr. Brand Males, Medical Director Dr. Wyline Copas is immediately available during today's Pulmonary Rehab session for [name] on 02/24/14 at 10:30am class time.

## 2014-03-01 ENCOUNTER — Encounter (HOSPITAL_COMMUNITY)
Admission: RE | Admit: 2014-03-01 | Discharge: 2014-03-01 | Disposition: A | Discharge status: Home / Self Care | Payer: 59 | Source: Ambulatory Visit | Attending: Internal Medicine | Admitting: Internal Medicine

## 2014-03-01 DIAGNOSIS — J439 Emphysema, unspecified: Secondary | ICD-10-CM | POA: Diagnosis not present

## 2014-03-01 NOTE — Progress Notes (Signed)
Today, Jeremiah [name] exercised at [facility]. Service [name] was from 1030 to 1130.  The patient exercised for more than 31 minutes performing aerobic, strengthening, and stretching exercises. Oxygen saturation, heart rate, blood pressure, rate of perceived exertion, and shortness of breath were all monitored before, during, and after exercise. [name] presented with no problems at today's exercise session.   There was no workload change during today's exercise session.  Pre-exercise vitals: . Weight kg: 78.2 . Liters of O2: RA . SpO2: 95 . HR: 83 . BP: 134/72 . CBG: na  Exercise vitals: . Highest heartrate:  102 . Lowest oxygen saturation: 93 . Highest blood pressure: 138/68 . Liters of 02: RA  Post-exercise vitals: . SpO2: 94 . HR: 80 . BP: 132/80 . Liters of O2: ra . CBG: na Dr. Brand Males, Medical Director Dr. Wyline Copas is immediately available during today's Pulmonary Rehab session for Jeremiah [name].  [name] Kitchen

## 2014-03-03 ENCOUNTER — Encounter (HOSPITAL_COMMUNITY)
Admission: RE | Admit: 2014-03-03 | Discharge: 2014-03-03 | Disposition: A | Discharge status: Home / Self Care | Payer: 59 | Source: Ambulatory Visit | Attending: Internal Medicine | Admitting: Internal Medicine

## 2014-03-03 DIAGNOSIS — J439 Emphysema, unspecified: Secondary | ICD-10-CM | POA: Diagnosis not present

## 2014-03-03 NOTE — Progress Notes (Signed)
Jeremiah [name] 68 y.o. male Nutrition Note Spoke with pt. Pt is at a normal wt for his height.  Pt states he eats at least 2 meals a day. Pt eats out 5-6 meals/week "for convenience." Pt denies difficulty cooking/grocery shopping. There are many ways the pt can make his eating habits healthier. Barriers to making dietary changes include "I'm going through a divorce right now." Pt did express interest in changing his diet for his health. Pt's Rate Your Plate results reviewed with pt. Pt avoids some salty food; does not use canned/ convenience food.  Pt does not add salt to food. Sodium content of food when eating out and ways to decrease sodium when eating out discussed. The role of sodium in lung disease reviewed with pt. Pt on prednisone. Pt uses Almond or Soy milk and eats greens to get calcium. Pt c/o dry mouth. Adequate hydration and ways to help improve c/o dry mouth discussed. Pt expressed understanding of the information reviewed.  Nutrition Diagnosis ? Food-and nutrition-related knowledge deficit related to lack of exposure to information as related to diagnosis of pulmonary disease ?  Nutrition Intervention ? Pt's individual nutrition plan and goals reviewed with pt. ? Benefits of adopting healthy eating habits discussed when pt's Rate Your Plate reviewed. ? Pt to attend the Nutrition and Lung Disease class ? Pt to consider adding Calcium Citrate supplement 600 mg BID with meals due to prednisone use. ? Continual client-centered nutrition education by RD, as part of interdisciplinary care. Goal(s) 1. Pt to identify and limit food sources of sodium. 2. Describe the benefit of including fruits, vegetables, whole grains, and low-fat dairy products in a healthy meal plan. Monitor and Evaluate progress toward nutrition goal with team.   Derek Mound, M.Ed, RD, LDN, CDE 03/03/2014 1:33 PM

## 2014-03-03 NOTE — Progress Notes (Signed)
Today, [name] exercised at [facility]. Service time was from 1030 to 1230.  The patient exercised for more than 31 minutes performing aerobic, strengthening, and stretching exercises. Oxygen saturation, heart rate, blood pressure, rate of perceived exertion, and shortness of breath were all monitored before, during, and after exercise. Jeremiah [name] presented with no problems at today's exercise session. Jeremiah [name] also attended an education session on exercise for the pulmonary patient.  There was no workload change during today's exercise session.  Pre-exercise vitals: . Weight kg: 79.2 . Liters of O2: ra . SpO2: 93 . HR: 73 . BP: 110/50 . CBG: na  Exercise vitals: . Highest heartrate:  71 . Lowest oxygen saturation: 93 . Highest blood pressure: 142/86 . Liters of 02: ra  Post-exercise vitals: . SpO2: 94 . HR: 70 . BP: 110/70 . Liters of O2: ra . CBG: na  Dr. Brand Males, Medical Director Dr. Wynelle Cleveland is immediately available during today's Pulmonary Rehab session for Jeremiah [name] on 03/03/2014 at 1030 class time.

## 2014-03-04 NOTE — Addendum Note (Signed)
Addended by: Parke Poisson E on: 03/04/2014 01:32 PM   Modules accepted: Orders, Medications

## 2014-03-08 ENCOUNTER — Encounter (HOSPITAL_COMMUNITY)
Admission: RE | Admit: 2014-03-08 | Discharge: 2014-03-08 | Disposition: A | Discharge status: Home / Self Care | Payer: 59 | Source: Ambulatory Visit | Attending: Internal Medicine | Admitting: Internal Medicine

## 2014-03-08 DIAGNOSIS — J439 Emphysema, unspecified: Secondary | ICD-10-CM | POA: Diagnosis not present

## 2014-03-08 NOTE — Progress Notes (Signed)
Today, Jeremiah [name] exercised at [facility]. Service time was from 1030 to 1210.  The patient exercised for more than 31 minutes performing aerobic, strengthening, and stretching exercises. Oxygen saturation, heart rate, blood pressure, rate of perceived exertion, and shortness of breath were all monitored before, during, and after exercise. [name] presented with no problems at today's exercise session.   There was no workload change during today's exercise session.  Pre-exercise vitals: . Weight kg: 78.4 . Liters of O2: RA . SpO2: 96 . HR: 69 . BP: 140/84 . CBG: na  Exercise vitals: . Highest heartrate:  89 . Lowest oxygen saturation: 94 . Highest blood pressure: 124/82 . Liters of 02: RA  Post-exercise vitals: . SpO2: 94 . HR: 73 . BP: 122/80 . Liters of O2: RA . CBG: na Dr. Brand Males, Medical Director Dr. Aileen Fass is immediately available during today's Pulmonary Rehab session for Jeremiah [name] on 03/08/2014 at 1030 class time.  Jeremiah [name] Kitchen

## 2014-03-10 ENCOUNTER — Encounter (HOSPITAL_COMMUNITY): Payer: 59

## 2014-03-15 ENCOUNTER — Encounter (HOSPITAL_COMMUNITY)
Admission: RE | Admit: 2014-03-15 | Discharge: 2014-03-15 | Disposition: A | Discharge status: Home / Self Care | Payer: Non-veteran care | Source: Ambulatory Visit | Attending: Internal Medicine | Admitting: Internal Medicine

## 2014-03-15 DIAGNOSIS — J439 Emphysema, unspecified: Secondary | ICD-10-CM | POA: Insufficient documentation

## 2014-03-15 NOTE — Progress Notes (Signed)
Today, Jeremiah [name] exercised at [facility]. Service time was from 10:30am to 12:15pm.  The patient exercised for more than 31 minutes performing aerobic, strengthening, and stretching exercises. Oxygen saturation, heart rate, blood pressure, rate of perceived exertion, and shortness of breath were all monitored before, during, and after exercise. Jeremiah [name] presented with no problems at today's exercise session.   There was no workload change during today's exercise session.  Pre-exercise vitals: . Weight kg: 79.5 . Liters of O2: ra . SpO2: 98 . HR: 81 . BP: 128/72 . CBG: na  Exercise vitals: . Highest heartrate:  89 . Lowest oxygen saturation: 88% . Highest blood pressure: 142/74 . Liters of 02: ra  Post-exercise vitals: . SpO2: 93 . HR: 77 . BP: 110/62 . Liters of O2: ra . CBG: na  Dr. Brand Males, Medical Director Dr. Waldron Labs is immediately available during today's Pulmonary Rehab session for Jeremiah [name] on 03/15/14 at 10:30am class time.

## 2014-03-17 ENCOUNTER — Encounter (HOSPITAL_COMMUNITY)
Admission: RE | Admit: 2014-03-17 | Discharge: 2014-03-17 | Disposition: A | Discharge status: Home / Self Care | Payer: Non-veteran care | Source: Ambulatory Visit | Attending: Internal Medicine | Admitting: Internal Medicine

## 2014-03-18 ENCOUNTER — Telehealth: Payer: Self-pay | Admitting: Internal Medicine

## 2014-03-18 NOTE — Telephone Encounter (Signed)
atc pt, line rang then went to fast busy signal.  wcb

## 2014-03-18 NOTE — Telephone Encounter (Signed)
He was doing very well at last ov here so no need to change anything until we regroup at next ov with his med calendar in hand (we make all changes to plans during the visits, not between) if need something sooner between visits that represents a change then see me or Tammy and we'll review it. Ok to move up f/u  ov if needed  If he's just asking for a replacement / refill then that's fine and ok to provide whatever neb machine his insurance will pay for

## 2014-03-18 NOTE — Telephone Encounter (Signed)
Called and spoke with pt and he wanted to see if he could get an rx for a portable nebulizer machine.  He has one at home that he has had for years and could not remember the company that he got this from  Would like to get rx for this so he can pick the company to use.  Pt would also like to get rx for the aero chamber to use with his inhalers.   MW please advise of both.  Thanks  No Known Allergies  Current Outpatient Prescriptions on File Prior to Visit  Medication Sig Dispense Refill  . albuterol (PROVENTIL HFA;VENTOLIN HFA) 108 (90 BASE) MCG/ACT inhaler Inhale 2 puffs into the lungs every 6 (six) hours as needed for wheezing or shortness of breath. (Patient taking differently: Inhale 2 puffs into the lungs every 4 (four) hours as needed for wheezing or shortness of breath. ) 1 Inhaler 1  . albuterol (PROVENTIL) (2.5 MG/3ML) 0.083% nebulizer solution Take 3 mLs by nebulization every 4 (four) hours as needed.    . budesonide-formoterol (SYMBICORT) 160-4.5 MCG/ACT inhaler Inhale 2 puffs into the lungs 2 (two) times daily.    [name] Kitchen dextromethorphan-guaiFENesin (MUCINEX DM) 30-600 MG per 12 hr tablet Take 1 tablet by mouth 2 (two) times daily as needed for cough.    [name] Kitchen FLUoxetine (PROZAC) 20 MG capsule Take 20 mg by mouth daily.    . hydrochlorothiazide (HYDRODIURIL) 25 MG tablet Take 25 mg by mouth daily.    . hydrOXYzine (ATARAX/VISTARIL) 25 MG tablet Take 25-50 mg by mouth every 6 (six) hours as needed for itching.     . magnesium oxide (MAG-OX) 400 MG tablet Take 400 mg by mouth 2 (two) times daily. For leg cramps    . Multiple Vitamin (CALCIUM COMPLEX PO) Take 3 tablets by mouth daily.    . Multiple Vitamin (MULTIVITAMIN WITH MINERALS) TABS tablet Take 1 tablet by mouth daily.    . pantoprazole (PROTONIX) 40 MG tablet Take 40 mg by mouth 2 (two) times daily before a meal.    . predniSONE (DELTASONE) 10 MG tablet Take  4 each am x 2 days,   2 each am x 2 days,  1 each am x 2 days and stop 20  tablet 0  . tiotropium (SPIRIVA) 18 MCG inhalation capsule Place 18 mcg into inhaler and inhale daily.     No current facility-administered medications on file prior to visit.

## 2014-03-21 MED ORDER — AEROCHAMBER PLUS MISC: Status: DC

## 2014-03-21 NOTE — Telephone Encounter (Signed)
I spoke with the pt and advised. He only wants an Rx for a new new neb machine and a spacer, no changes are being made. Rx left at front for machine and spacer as well. Pt advised. [place] Bing, CMA

## 2014-03-22 ENCOUNTER — Encounter (HOSPITAL_COMMUNITY)
Admission: RE | Admit: 2014-03-22 | Discharge: 2014-03-22 | Disposition: A | Discharge status: Home / Self Care | Payer: Non-veteran care | Source: Ambulatory Visit | Attending: Internal Medicine | Admitting: Internal Medicine

## 2014-03-22 NOTE — Progress Notes (Signed)
I have reviewed a Home Exercise Prescription with Jeremiah [name] . Jesper is not currently exercising at home.  The patient was advised to walk 2-3 days a week for 25 minutes.  Gwyndolyn Saxon and I discussed how to progress their exercise prescription.  The patient stated that their goals were to breathe better and be able to do more each day.  The patient stated that they understand the exercise prescription.  We reviewed exercise guidelines, target heart rate during exercise, oxygen use, weather, home pulse oximeter, endpoints for exercise, and goals.  Patient is encouraged to come to me with any questions. I will continue to follow up with the patient to assist them with progression and safety.

## 2014-03-22 NOTE — Progress Notes (Signed)
Today, Jeremiah [name] exercised at [employer]. Service time was from 10:30am to 12:00pm.  The patient exercised for more than 31 minutes performing aerobic, strengthening, and stretching exercises. Oxygen saturation, heart rate, blood pressure, rate of perceived exertion, and shortness of breath were all monitored before, during, and after exercise. Jeremiah [name] presented with no problems at today's exercise session.   There was no workload change during today's exercise session.  Pre-exercise vitals: . Weight kg: 78.2 . Liters of O2: ra . SpO2: 95 . HR: 74 . BP: 138/78 . CBG: na  Exercise vitals: . Highest heartrate:  86 . Lowest oxygen saturation: 93 . Highest blood pressure: 132/72 . Liters of 02: ra  Post-exercise vitals: . SpO2: 96 . HR: 65 . BP: 120/80 . Liters of O2: ra . CBG: na  Dr. Brand Males, Medical Director [name] is immediately available during today's Pulmonary Rehab session for Jeremiah [name] on 03/22/14 at 10:30am class time.

## 2014-03-24 ENCOUNTER — Encounter (HOSPITAL_COMMUNITY)
Admission: RE | Admit: 2014-03-24 | Discharge: 2014-03-24 | Disposition: A | Discharge status: Home / Self Care | Payer: Non-veteran care | Source: Ambulatory Visit | Attending: Internal Medicine | Admitting: Internal Medicine

## 2014-03-24 NOTE — Progress Notes (Signed)
Called and spoke with Jenny Reichmann at [place] for [facility] choice regarding authorization for Mr. Kupfer to attend pulmonary rehab here at [facility]. Authorization granted and backdated from February 14 2014 to April 15 2014. Fax to be received and placed in patients chart. If further exercise sessions are required for patient to reach his maximum potential, a request for additional services form will be faxed to [place]. Called and left message with patient regarding conversation with Jenny Reichmann and requested that patient call back with questions or concerns.

## 2014-03-24 NOTE — Progress Notes (Signed)
Today, Jeremiah [name] exercised at [employer]. [facility]. Service time was from 10:30am to 12:20pm.  The patient exercised for more than 31 minutes performing aerobic, strengthening, and stretching exercises. Oxygen saturation, heart rate, blood pressure, rate of perceived exertion, and shortness of breath were all monitored before, during, and after exercise. Brain presented with no problems at today's exercise session. The patient attended education with Jeanella Craze on Advanced Directives.  There was an increase in workload change during today's exercise session.  Pre-exercise vitals: . Weight kg: 77.9 . Liters of O2: ra . SpO2: 96 . HR: 66 . BP: 120/68 . CBG: na  Exercise vitals: . Highest heartrate:  87 . Lowest oxygen saturation: 95 . Highest blood pressure: 158/60 . Liters of 02: ra  Post-exercise vitals: . SpO2: 96  . HR: 71 . BP: 120/64 . Liters of O2: ra . CBG: na  Dr. Brand Males, Medical Director Dr. Coralyn Pear is immediately available during today's Pulmonary Rehab session for Jeremiah [name] on 03/24/14 at 10:30am class time.

## 2014-03-27 ENCOUNTER — Emergency Department (HOSPITAL_COMMUNITY): Payer: Non-veteran care

## 2014-03-27 ENCOUNTER — Encounter (HOSPITAL_COMMUNITY): Payer: Self-pay | Admitting: *Deleted

## 2014-03-27 ENCOUNTER — Emergency Department (HOSPITAL_COMMUNITY)
Admission: EM | Admit: 2014-03-27 | Discharge: 2014-03-27 | Disposition: A | Discharge status: Home / Self Care | Payer: Non-veteran care | Attending: Emergency Medicine | Admitting: Emergency Medicine

## 2014-03-27 DIAGNOSIS — F329 Major depressive disorder, single episode, unspecified: Secondary | ICD-10-CM | POA: Insufficient documentation

## 2014-03-27 DIAGNOSIS — Z8669 Personal history of other diseases of the nervous system and sense organs: Secondary | ICD-10-CM | POA: Insufficient documentation

## 2014-03-27 DIAGNOSIS — Z87891 Personal history of nicotine dependence: Secondary | ICD-10-CM | POA: Diagnosis not present

## 2014-03-27 DIAGNOSIS — J45909 Unspecified asthma, uncomplicated: Secondary | ICD-10-CM | POA: Diagnosis not present

## 2014-03-27 DIAGNOSIS — J441 Chronic obstructive pulmonary disease with (acute) exacerbation: Secondary | ICD-10-CM

## 2014-03-27 DIAGNOSIS — Z79899 Other long term (current) drug therapy: Secondary | ICD-10-CM | POA: Diagnosis not present

## 2014-03-27 DIAGNOSIS — Z7951 Long term (current) use of inhaled steroids: Secondary | ICD-10-CM | POA: Diagnosis not present

## 2014-03-27 DIAGNOSIS — I10 Essential (primary) hypertension: Secondary | ICD-10-CM | POA: Insufficient documentation

## 2014-03-27 DIAGNOSIS — R0602 Shortness of breath: Secondary | ICD-10-CM | POA: Diagnosis not present

## 2014-03-27 DIAGNOSIS — J449 Chronic obstructive pulmonary disease, unspecified: Secondary | ICD-10-CM | POA: Diagnosis not present

## 2014-03-27 DIAGNOSIS — J45901 Unspecified asthma with (acute) exacerbation: Secondary | ICD-10-CM

## 2014-03-27 DIAGNOSIS — R9431 Abnormal electrocardiogram [ECG] [EKG]: Secondary | ICD-10-CM | POA: Diagnosis not present

## 2014-03-27 DIAGNOSIS — R069 Unspecified abnormalities of breathing: Secondary | ICD-10-CM | POA: Diagnosis not present

## 2014-03-27 DIAGNOSIS — J40 Bronchitis, not specified as acute or chronic: Secondary | ICD-10-CM | POA: Diagnosis not present

## 2014-03-27 LAB — CBC
HCT: 49.5 % (ref 39.0–52.0)
Hemoglobin: 16.7 g/dL (ref 13.0–17.0)
MCH: 30.4 pg (ref 26.0–34.0)
MCHC: 33.7 g/dL (ref 30.0–36.0)
MCV: 90.2 fL (ref 78.0–100.0)
PLATELETS: 277 10*3/uL (ref 150–400)
RBC: 5.49 MIL/uL (ref 4.22–5.81)
RDW: 13.3 % (ref 11.5–15.5)
WBC: 9 10*3/uL (ref 4.0–10.5)

## 2014-03-27 LAB — BASIC METABOLIC PANEL
ANION GAP: 15 (ref 5–15)
Anion gap: 15 (ref 5–15)
BUN: 11 mg/dL (ref 6–23)
BUN: 11 mg/dL (ref 6–23)
CALCIUM: 10.2 mg/dL (ref 8.4–10.5)
CALCIUM: 10 mg/dL (ref 8.4–10.5)
CO2: 25 mEq/L (ref 19–32)
CO2: 24 mEq/L (ref 19–32)
Chloride: 97 mEq/L (ref 96–112)
Chloride: 96 mEq/L (ref 96–112)
Creatinine, Ser: 1.03 mg/dL (ref 0.50–1.35)
Creatinine, Ser: 0.95 mg/dL (ref 0.50–1.35)
GFR calc Af Amer: 85 mL/min — ABNORMAL LOW (ref >90)
GFR calc non Af Amer: 74 mL/min — ABNORMAL LOW (ref >90)
GFR, EST NON AFRICAN AMERICAN: 85 mL/min — AB (ref >90)
GLUCOSE: 138 mg/dL — AB (ref 70–99)
GLUCOSE: 124 mg/dL — AB (ref 70–99)
Potassium: 3.7 mEq/L (ref 3.7–5.3)
Potassium: 3.5 mEq/L — ABNORMAL LOW (ref 3.7–5.3)
SODIUM: 137 meq/L (ref 137–147)
Sodium: 135 mEq/L — ABNORMAL LOW (ref 137–147)

## 2014-03-27 LAB — TROPONIN I

## 2014-03-27 LAB — I-STAT TROPONIN, ED: Troponin i, poc: 0 ng/mL (ref 0.00–0.08)

## 2014-03-27 MED ORDER — PREDNISONE 20 MG PO TABS: 20.0000 mg | ORAL_TABLET | Freq: Two times a day (BID) | ORAL | Status: DC

## 2014-03-27 MED ORDER — ALBUTEROL (5 MG/ML) CONTINUOUS INHALATION SOLN
15.0000 mg | INHALATION_SOLUTION | Freq: Once | RESPIRATORY_TRACT | Status: AC
  Administered 2014-03-27: 15 mg via RESPIRATORY_TRACT
  Filled 2014-03-27: qty 20

## 2014-03-27 NOTE — ED Notes (Signed)
Called Xray for pt.

## 2014-03-27 NOTE — ED Notes (Signed)
Pt alertx 4 respiration non labored

## 2014-03-27 NOTE — ED Provider Notes (Signed)
CSN: [id number]     Arrival date & time 03/27/14  1457 History   First MD Initiated Contact with Patient 03/27/14 1538     Chief Complaint  Patient presents with  . Shortness of Breath     (Consider location/radiation/quality/duration/timing/severity/associated sxs/prior Treatment) HPI   Jeremiah [name] is a 68 y.o. male who complains of worsening trouble breathing for 2 days, not responding to his usual treatments of nebulizers, inhalers, and relaxation techniques.  He has intermittent problems with breathing trouble, at least every 5 days or so, according to his wife.  He continues to participate in pulmonary rehabilitation, last treatment was 03/24/2014.  He has had some chills recently, but no documented fever.  He denies cough, chest pain, nausea, vomiting or problems urinating.  He is using his usual medications, without relief.  There are no other known modifying factors.   Past Medical History  Diagnosis Date  . COPD (chronic obstructive pulmonary disease)   . Sleep apnea   . Hypertension   . Depression   . PTSD (post-traumatic stress disorder)    History reviewed. No pertinent past surgical history. Family History  Problem Relation Age of Onset  . Asthma Maternal Grandmother   . Heart disease Father    History  Substance Use Topics  . Smoking status: Former Smoker -- 1.00 packs/day for 30 years    Types: Cigarettes    Quit date: 04/16/1987  . Smokeless tobacco: Never Used  . Alcohol Use: 1.2 oz/week    2 Glasses of wine per week    Review of Systems  All other systems reviewed and are negative.     Allergies  Review of patient's allergies indicates no known allergies.  Home Medications   Prior to Admission medications   Medication Sig Start Date End Date Taking? Authorizing Provider  albuterol (PROVENTIL HFA;VENTOLIN HFA) 108 (90 BASE) MCG/ACT inhaler Inhale 2 puffs into the lungs every 6 (six) hours as needed for wheezing or shortness of breath. Patient  taking differently: Inhale 2 puffs into the lungs every 4 (four) hours as needed for wheezing or shortness of breath.  10/11/13  Yes Gregor Hams, MD  albuterol (PROVENTIL) (2.5 MG/3ML) 0.083% nebulizer solution Take 3 mLs by nebulization every 4 (four) hours as needed. 12/17/13  Yes Historical Provider, MD  budesonide-formoterol (SYMBICORT) 160-4.5 MCG/ACT inhaler Inhale 2 puffs into the lungs 2 (two) times daily.   Yes Historical Provider, MD  dextromethorphan-guaiFENesin (MUCINEX DM) 30-600 MG per 12 hr tablet Take 1 tablet by mouth 2 (two) times daily as needed for cough.   Yes Historical Provider, MD  FLUoxetine (PROZAC) 20 MG capsule Take 20 mg by mouth daily.   Yes Historical Provider, MD  hydrochlorothiazide (HYDRODIURIL) 25 MG tablet Take 25 mg by mouth daily.   Yes Historical Provider, MD  hydrOXYzine (ATARAX/VISTARIL) 25 MG tablet Take 25-50 mg by mouth every 6 (six) hours as needed for itching.    Yes Historical Provider, MD  magnesium oxide (MAG-OX) 400 MG tablet Take 400 mg by mouth 2 (two) times daily. For leg cramps   Yes Historical Provider, MD  Multiple Vitamin (MULTIVITAMIN WITH MINERALS) TABS tablet Take 1 tablet by mouth daily.   Yes Historical Provider, MD  pantoprazole (PROTONIX) 40 MG tablet Take 40 mg by mouth 2 (two) times daily before a meal.   Yes Historical Provider, MD  Spacer/Aero-Holding Chambers (AEROCHAMBER PLUS) inhaler Use as instructed 03/21/14  Yes Tanda Rockers, MD  tiotropium (SPIRIVA) 18 MCG inhalation capsule  Place 18 mcg into inhaler and inhale daily.   Yes Historical Provider, MD  predniSONE (DELTASONE) 20 MG tablet Take 1 tablet (20 mg total) by mouth 2 (two) times daily. 03/27/14   Richarda Blade, MD   BP 151/96 mmHg  Pulse 98  Temp(Src) 97.5 F (36.4 C) (Oral)  Resp 18  Ht 6\' 1"  (1.854 m)  Wt 173 lb (78.472 kg)  BMI 22.83 kg/m2  SpO2 92% Physical Exam  Constitutional: He is oriented to person, place, and time. He appears well-developed and  well-nourished.  HENT:  Head: Normocephalic and atraumatic.  Right Ear: External ear normal.  Left Ear: External ear normal.  Eyes: Conjunctivae and EOM are normal. Pupils are equal, round, and reactive to light.  Neck: Normal range of motion and phonation normal. Neck supple.  Cardiovascular: Normal rate, regular rhythm and normal heart sounds.   Pulmonary/Chest: Effort normal. No respiratory distress. He has no rales. He exhibits no bony tenderness.  Decreased bilaterally with generalized expiratory wheezing.  Abdominal: Soft. There is no tenderness.  Musculoskeletal: Normal range of motion. He exhibits no edema or tenderness.  Neurological: He is alert and oriented to person, place, and time. No cranial nerve deficit or sensory deficit. He exhibits normal muscle tone. Coordination normal.  Skin: Skin is warm, dry and intact.  Psychiatric: He has a normal mood and affect. His behavior is normal. Judgment and thought content normal.  Nursing note and vitals reviewed.   ED Course  Procedures (including critical care time) Medications  albuterol (PROVENTIL,VENTOLIN) solution continuous neb (15 mg Nebulization Given 03/27/14 1604)    Patient Vitals for the past 24 hrs:  BP Temp Temp src Pulse Resp SpO2 Height Weight  03/27/14 2054 151/96 mmHg - - 98 18 92 % - -  03/27/14 2030 141/84 mmHg - - 97 - 95 % - -  03/27/14 2019 118/85 mmHg - - 97 11 95 % - -  03/27/14 2015 118/85 mmHg - - 98 (!) 30 94 % - -  03/27/14 1920 131/73 mmHg - - 102 22 95 % - -  03/27/14 1915 131/73 mmHg - - 98 24 93 % - -  03/27/14 1845 137/79 mmHg - - 102 18 92 % - -  03/27/14 1745 138/76 mmHg - - 103 24 93 % - -  03/27/14 1715 146/83 mmHg - - 101 20 94 % - -  03/27/14 1700 141/80 mmHg - - 97 (!) 29 93 % - -  03/27/14 1645 140/85 mmHg - - 95 23 94 % - -  03/27/14 1630 134/85 mmHg - - 94 21 94 % - -  03/27/14 1615 131/72 mmHg - - 96 15 97 % - -  03/27/14 1608 - - - - - 94 % - -  03/27/14 1600 131/82 mmHg - -  97 25 95 % - -  03/27/14 1545 148/91 mmHg - - 106 (!) 28 95 % - -  03/27/14 1543 152/88 mmHg - - 106 (!) 28 95 % - -  03/27/14 1504 (!) 163/104 mmHg 97.5 F (36.4 C) Oral 107 24 99 % 6\' 1"  (1.854 m) 173 lb (78.472 kg)    8:35 PM Reevaluation with update and discussion. After initial assessment and treatment, an updated evaluation reveals he felt better after albuterol continuous nebulizer treatment. Conroy Goracke L   Ambulation trial- he was able to ambulate, with oxygen saturation above 90%.  He had mild dyspnea, with walking.  10:09 PM Reevaluation with update and discussion. After  initial assessment and treatment, an updated evaluation reveals he continues to feel well. Discussed findings and plan with patient. Enza Shone L   Labs Review Labs Reviewed  BASIC METABOLIC PANEL - Abnormal; Notable for the following:    Glucose, Bld 138 (*)    GFR calc non Af Amer 74 (*)    GFR calc Af Amer 85 (*)    All other components within normal limits  BASIC METABOLIC PANEL - Abnormal; Notable for the following:    Sodium 135 (*)    Potassium 3.5 (*)    Glucose, Bld 124 (*)    GFR calc non Af Amer 85 (*)    All other components within normal limits  CBC  TROPONIN I  I-STAT TROPOININ, ED    Imaging Review Dg Chest 2 View (if Patient Has Fever And/or Copd)  03/27/2014   CLINICAL DATA:  Shortness of breath for 3-4 days. Symptoms are worse today. History of COPD.  EXAM: CHEST  2 VIEW  COMPARISON:  01/03/2014  FINDINGS: Lungs are hyperinflated. There is perihilar peribronchial thickening. Heart size is normal. No focal consolidations or pleural effusions. No pulmonary edema. Old left rib fractures.  IMPRESSION: 1. Hyperinflation and bronchitic changes. 2.  No focal acute pulmonary abnormality.   Electronically Signed   By: Shon Hale M.D.   On: 03/27/2014 18:41     EKG Interpretation   Date/Time:  Sunday March 27 2014 16:38:15 EST Ventricular Rate:  96 PR Interval:  131 QRS Duration:  83 QT Interval:  379 QTC Calculation: 479 R Axis:   62 Text Interpretation:  Sinus rhythm Borderline prolonged QT interval Since  last tracing of earlier today rate is slower and QT is improved. Confirmed  by Eulis Foster  MD, Vira Agar [id number]) on 03/27/2014 4:48:53 PM         MDM   Final diagnoses:  Acute exacerbation of COPD with asthma    COPD exacerbation, recurrent.  No apparent pneumonia, doubt PE, no apparent serious bacterial infection.  Nursing Notes Reviewed/ Care Coordinated Applicable Imaging Reviewed Interpretation of Laboratory Data incorporated into ED treatment  The patient appears reasonably screened and/or stabilized for discharge and I doubt any other medical condition or other [facility] requiring further screening, evaluation, or treatment in the ED at this time prior to discharge.  Plan: Home Medications- Prednisone; Home Treatments- rest; return here if the recommended treatment, does not improve the symptoms; Recommended follow up- Pulm. In 1 week    Richarda Blade, MD 03/27/14 2211

## 2014-03-27 NOTE — ED Notes (Addendum)
Pt c/o increased shortness of breath x 2 days. Pt uses nebulizer treatments and CPAP at home for shortness of breath. Wheezes noted throughout; denies chest pain/pressure at this time. Pt has hx of intubation for same. Pt received two neb treatments en route via EMS. Dr.Wentz at bedside

## 2014-03-27 NOTE — ED Notes (Signed)
Per EMS- pt has had worsening SOB over the last day. Pt reports intermittent chest pressure as well. Pt states that SOB is worse with exertion. Pt was initially decreased with faint wheezing with EMS. Pt now with increased breath sounds and wheezing. Pt received 2 duonebs en route and 125 mg solumedrol.

## 2014-03-27 NOTE — Discharge Instructions (Signed)
Use your albuterol inhaler, or nebulizer, every 2-4 hours as needed for cough or trouble breathing. Start the prednisone prescription, in the morning.   Chronic Asthmatic Bronchitis Chronic asthmatic bronchitis is a complication of persistent asthma. After a period of time with asthma, some people develop airflow obstruction that is present all the time, even when not having an asthma attack.There is also persistent inflammation of the airways, and the bronchial tubes produce more mucus. Chronic asthmatic bronchitis usually is a permanent problem with the lungs. CAUSES  Chronic asthmatic bronchitis happens most often in people who have asthma and also smoke cigarettes. Occasionally, it can happen to a person with long-standing or severe asthma even if the person is not a smoker. SIGNS AND SYMPTOMS  Chronic asthmatic bronchitis usually causes symptoms of both asthma and chronic bronchitis, including:   Coughing.  Increased sputum production.  Wheezing and shortness of breath.  Chest discomfort.  Recurring infections. DIAGNOSIS  Your health care provider will take a medical history and perform a physical exam. Chronic asthmatic bronchitis is suspected when a person with asthma has abnormal results on breathing tests (pulmonary function tests) even when breathing symptoms are at their best. Other tests, such as a chest X-ray, may be performed to rule out other conditions.  TREATMENT  Treatment involves controlling symptoms with medicine and lifestyle changes.  Your health care provider may prescribe asthma medicines, including inhaler and nebulizer medicines.  Infection can be treated with medicine to kill germs (antibiotics). Serious infections may require hospitalization. These can include:  Pneumonia.  Sinus infections.  Acute bronchitis.   Preventing infection and hospitalization is very important. Get an influenza vaccination every year as directed by your health care provider.  Ask your health care provider whether you need a pneumonia vaccine.  Ask your health care provider whether you would benefit from a pulmonary rehabilitation program. HOME CARE INSTRUCTIONS  Take medicines only as directed by your health care provider.  If you are a cigarette smoker, the most important thing that you can do is quit. Talk to your health care provider for help with quitting smoking.  Avoid pollen, dust, animal dander, molds, smoke, and other things that cause attacks.  Regular exercise is very important to help you feel better. Discuss possible exercise routines with your health care provider.  If animal dander is the cause of asthma, you may not be able to keep pets.  It is important that you:  Become educated about your medical condition.  Participate in maintaining wellness.  Seek medical care as directed. Delay in seeking medical care could cause permanent injury and may be a risk to your life. SEEK MEDICAL CARE IF:  You have wheezing and shortness of breath even if taking medicine to prevent attacks.  You have muscle aches, chest pain, or thickening of sputum.  Your sputum changes from clear or white to yellow, green, gray, or bloody. SEEK IMMEDIATE MEDICAL CARE IF:  Your usual medicines do not stop your wheezing.  You have increased coughing or shortness of breath or both.  You have increased difficulty breathing.  You have any problems from the medicine you are taking, such as a rash, itching, swelling, or trouble breathing. MAKE SURE YOU:   Understand these instructions.  Will watch your condition.  Will get help right away if you are not doing well or get worse. Document Released: 01/17/2006 Document Revised: 08/16/2013 Document Reviewed: 05/10/2013 [facility] Patient Information 2015 [place], [place]. This information is not intended to replace advice  given to you by your health care provider. Make sure you discuss any questions you have with your  health care provider.  Asthma Asthma is a recurring condition in which the airways tighten and narrow. Asthma can make it difficult to breathe. It can cause coughing, wheezing, and shortness of breath. Asthma episodes, also called asthma attacks, range from minor to life-threatening. Asthma cannot be cured, but medicines and lifestyle changes can help control it. CAUSES Asthma is believed to be caused by inherited (genetic) and environmental factors, but its exact cause is unknown. Asthma may be triggered by allergens, lung infections, or irritants in the air. Asthma triggers are different for each person. Common triggers include:   Animal dander.  Dust mites.  Cockroaches.  Pollen from trees or grass.  Mold.  Smoke.  Air pollutants such as dust, household cleaners, hair sprays, aerosol sprays, paint fumes, strong chemicals, or strong odors.  Cold air, weather changes, and winds (which increase molds and pollens in the air).  Strong emotional expressions such as crying or laughing hard.  Stress.  Certain medicines (such as aspirin) or types of drugs (such as beta-blockers).  Sulfites in foods and drinks. Foods and drinks that may contain sulfites include dried fruit, potato chips, and sparkling grape juice.  Infections or inflammatory conditions such as the flu, a cold, or an inflammation of the nasal membranes (rhinitis).  Gastroesophageal reflux disease (GERD).  Exercise or strenuous activity. SYMPTOMS Symptoms may occur immediately after asthma is triggered or many hours later. Symptoms include:  Wheezing.  Excessive nighttime or early morning coughing.  Frequent or severe coughing with a common cold.  Chest tightness.  Shortness of breath. DIAGNOSIS  The diagnosis of asthma is made by a review of your medical history and a physical exam. Tests may also be performed. These may include:  Lung function studies. These tests show how much air you breathe in and  out.  Allergy tests.  Imaging tests such as X-rays. TREATMENT  Asthma cannot be cured, but it can usually be controlled. Treatment involves identifying and avoiding your asthma triggers. It also involves medicines. There are 2 classes of medicine used for asthma treatment:   Controller medicines. These prevent asthma symptoms from occurring. They are usually taken every day.  Reliever or rescue medicines. These quickly relieve asthma symptoms. They are used as needed and provide short-term relief. Your health care provider will help you create an asthma action plan. An asthma action plan is a written plan for managing and treating your asthma attacks. It includes a list of your asthma triggers and how they may be avoided. It also includes information on when medicines should be taken and when their dosage should be changed. An action plan may also involve the use of a device called a peak flow meter. A peak flow meter measures how well the lungs are working. It helps you monitor your condition. HOME CARE INSTRUCTIONS   Take medicines only as directed by your health care provider. Speak with your health care provider if you have questions about how or when to take the medicines.  Use a peak flow meter as directed by your health care provider. Record and keep track of readings.  Understand and use the action plan to help minimize or stop an asthma attack without needing to seek medical care.  Control your home environment in the following ways to help prevent asthma attacks:  Do not smoke. Avoid being exposed to secondhand smoke.  Change your heating  and air conditioning filter regularly.  Limit your use of fireplaces and wood stoves.  Get rid of pests (such as roaches and mice) and their droppings.  Throw away plants if you see mold on them.  Clean your floors and dust regularly. Use unscented cleaning products.  Try to have someone else vacuum for you regularly. Stay out of rooms  while they are being vacuumed and for a short while afterward. If you vacuum, use a dust mask from a hardware store, a double-layered or microfilter vacuum cleaner bag, or a vacuum cleaner with a HEPA filter.  Replace carpet with wood, tile, or vinyl flooring. Carpet can trap dander and dust.  Use allergy-proof pillows, mattress covers, and box spring covers.  Wash bed sheets and blankets every week in hot water and dry them in a dryer.  Use blankets that are made of polyester or cotton.  Clean bathrooms and kitchens with bleach. If possible, have someone repaint the walls in these rooms with mold-resistant paint. Keep out of the rooms that are being cleaned and painted.  Wash hands frequently. SEEK MEDICAL CARE IF:   You have wheezing, shortness of breath, or a cough even if taking medicine to prevent attacks.  The colored mucus you cough up (sputum) is thicker than usual.  Your sputum changes from clear or white to yellow, green, gray, or bloody.  You have any problems that may be related to the medicines you are taking (such as a rash, itching, swelling, or trouble breathing).  You are using a reliever medicine more than 2-3 times per week.  Your peak flow is still at 50-79% of your personal best after following your action plan for 1 hour.  You have a fever. SEEK IMMEDIATE MEDICAL CARE IF:   You seem to be getting worse and are unresponsive to treatment during an asthma attack.  You are short of breath even at rest.  You get short of breath when doing very little physical activity.  You have difficulty eating, drinking, or talking due to asthma symptoms.  You develop chest pain.  You develop a fast heartbeat.  You have a bluish color to your lips or fingernails.  You are light-headed, dizzy, or faint.  Your peak flow is less than 50% of your personal best. MAKE SURE YOU:   Understand these instructions.  Will watch your condition.  Will get help right away if  you are not doing well or get worse. Document Released: 04/01/2005 Document Revised: 08/16/2013 Document Reviewed: 10/29/2012 [facility] Patient Information 2015 [place], [place]. This information is not intended to replace advice given to you by your health care provider. Make sure you discuss any questions you have with your health care provider.

## 2014-03-27 NOTE — ED Notes (Signed)
Patient returned from X-ray 

## 2014-03-27 NOTE — ED Notes (Signed)
Albuterol treatment completed.

## 2014-03-27 NOTE — ED Notes (Signed)
Walked pt in the hall with pulse ox. Pt stayed consistent around 91% on room air when walking and reported a little difficulty breathing and a small head ache while walking.  Pt pulse stayed around 102 while walking.  Upon returning to bed pt pulse 98 and pulse ox 92% on room air.

## 2014-03-28 ENCOUNTER — Telehealth: Payer: Self-pay | Admitting: *Deleted

## 2014-03-28 NOTE — Telephone Encounter (Signed)
LMTCB

## 2014-03-28 NOTE — Telephone Encounter (Signed)
Pt called and scheduled appt for 03/29/14

## 2014-03-28 NOTE — Telephone Encounter (Signed)
-----   Message from Tanda Rockers, MD sent at 03/28/2014  6:11 AM EST ----- needs ov with all meds in hand as went to ER over weekend/needs to come in w/in next 7-10 days

## 2014-03-29 ENCOUNTER — Encounter (INDEPENDENT_AMBULATORY_CARE_PROVIDER_SITE_OTHER): Payer: Self-pay

## 2014-03-29 ENCOUNTER — Encounter: Payer: Self-pay | Admitting: Internal Medicine

## 2014-03-29 ENCOUNTER — Ambulatory Visit (INDEPENDENT_AMBULATORY_CARE_PROVIDER_SITE_OTHER): Payer: 59 | Admitting: Internal Medicine

## 2014-03-29 ENCOUNTER — Encounter (HOSPITAL_COMMUNITY)
Admission: RE | Admit: 2014-03-29 | Discharge: 2014-03-29 | Disposition: A | Discharge status: Home / Self Care | Payer: Non-veteran care | Source: Ambulatory Visit | Attending: Internal Medicine | Admitting: Internal Medicine

## 2014-03-29 VITALS — BP 122/80 | HR 74 | Ht 73.5 in | Wt 183.0 lb

## 2014-03-29 DIAGNOSIS — J449 Chronic obstructive pulmonary disease, unspecified: Secondary | ICD-10-CM

## 2014-03-29 MED ORDER — PREDNISONE 10 MG PO TABS: ORAL_TABLET | ORAL | Status: DC

## 2014-03-29 MED ORDER — ALPRAZOLAM 0.5 MG PO TABS: ORAL_TABLET | ORAL | Status: DC

## 2014-03-29 NOTE — Progress Notes (Signed)
[facility] with the [facility]. Requested authorization for pulmonary rehab to be sent. Also faxed a request for additional services to [facility].

## 2014-03-29 NOTE — Progress Notes (Signed)
Today, Excell exercised at [employer] [facility]. Service time was from 1030 to 1215.  The patient exercised for more than 31 minutes performing aerobic, strengthening, and stretching exercises. Oxygen saturation, heart rate, blood pressure, rate of perceived exertion, and shortness of breath were all monitored before, during, and after exercise. Jeremiah [name] presented with no problems at today's exercise session.   There was no workload change during today's exercise session.  Pre-exercise vitals: . Weight kg: 78.4 . Liters of O2: ra . SpO2: 94 . HR: 83 . BP: 126/80 . CBG: na  Exercise vitals: . Highest heartrate:  87 . Lowest oxygen saturation: 93 . Highest blood pressure: 140/84 . Liters of 02: ra  Post-exercise vitals: . SpO2: 93 . HR: 74 . BP: 126/64 . Liters of O2: ra . CBG: na  Dr. Brand Males, Medical Director Dr. Coralyn Pear is immediately available during today's Pulmonary Rehab session for Jeremiah [name] on 03/29/2014 at 1030 class time.

## 2014-03-29 NOTE — Progress Notes (Signed)
Subjective:     Patient ID: Jeremiah [name], male   DOB: 1945/06/03   MRN: [id number]    Brief patient profile:  67 yobm quit smoking 1989 with no problems with variable sob x 2004 and maintained on inhalers referred 05/03/2013 by Dr Titus Mould for evaluation of outpt management for copd after hospitalization.  Admit date: 04/24/2013  Discharge date: 04/27/2013  Discharge Diagnoses:  Active Problems:  COPD exacerbation  Detailed Hospital Course:  68 yo male with abrupt onset of sob (1/10) that progressively worsened until EMS called. Patient intubated in ED due to severe respiratory distress. In route, patient had increasing tachypnea, decline SpO2, and obtundation. Patient with GCS <8 at arrival, evidence of emesis, intubated for severe respiratory distress. Family reported no evidence of fevers, chills, malaise, worsening coughing, and sob. Initial episode passed, however sister received a call later with him reporting a return of symptoms, complaints of inability of breathing and EMS alerted. Distant history of tobacco abuse. Has never been hospitalized for COPD before this episode. He has had no recent hospitalizations, and has never been previously intubated.  He was admitted to the ICU on 1/11 and was treated with the typical modalities for acute respiratory failure (for presumed AECODP) which included mechanical ventilation with sedation, inhaled bronchodilators, antibiotics, and systemic steroid therapy. He improved later that day and was able to be extubated and sedation was stopped. His care since that point has primarily focused on the de-escalation of the therapies mentioned above. On 1/13 he has returned to a near-baseline state of health and has been deemed a candidate for discharge.  Discharge Plan by diagnoses  Acute exacerbation of COPD  Acute hypercarbic respiratory failure (resolved)  plan  -Follow up with Dr. Melvyn Novas on 05/03/2013 at 2:30PM  -Change steroids to PO Prednisone, taper over  7-10d  -Resume home dose BDs    History of Present Illness  05/03/2013 1st [facility] office visit/ Jeremiah [name] cc more sob since Oct 2014 on symbicort 160 2 bid and spiriva daily  But still  daily use of proventil and no need for neb then needed neb x sev months prior to his admit above but back to baseline since d/c on pred still tapering off but using saba qid, not prn "out of habit" >>return for PFT   01/03/2014 Follow up  [name] .  Complains of increased episodes of increased DOE, wheezing, prod cough with white/clear mucus worse for last week. Family member says he has been progressively get worse over last year. Was admitted 04/2013 w/ COPD flare /VDRF . [name] from last office visit however did not follow up .   Followed at [facility] , says he was told he has Moderate COPD 6 yrs ago. On Symbicort and Spiriva.  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  Says he gets winded easily esp on incline.  Says worse for last week with increased cough and wheezing.  No hemotpysis, wt loss, calf pain, n/v/d, chest pain or fever.  Last abx ~2 months ago.  rec Doxycycline 100mg  Twice daily  For 7 days  Prednisone taper over next week Mucinex DM Twice daily  As needed  Cough /congestion  Continue on Symbicort and Spiriva .   01/24/2014 f/u ov/Jeremiah [name] re:  GOLD III Chief Complaint  Patient presents with  . Follow-up    PFT done today. Pt states  that SOB and cough have improved some, but not back to normal baseline. Cough is prod with minimal clear sputum.   can do HT but no mall due to sob Prednisone really helped a lot but did not maintain improvement once finished >>pred taper add ppi bid and diet    02/21/2014 Follow up and Medication Review  Patient returns for follow-up and medication review. We reviewed all his medications organize them into a medication count with patient  education. It appears that he is take his medications correctly. Has begun pulmonary rehab, really likes it.  Patient has COPD flare last time was treated with prednisone. Feels that he did help. rec no change rx/ follow med calendar     03/29/2014 f/u ov/Jeremiah [name] re: GOLD III/ no med calendar/ misunderstanding saba  Chief Complaint  Patient presents with  . Follow-up    Pt states went to ED 03/27/14 and dxed with acute COPD exacerbation. He states that he started noticing having increased SOB on 03/25/14.  He states that today his breathing is doing much better, and feels he is almost back to his normal baseline.    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 No unusual exp hx or h/o childhood pna/ asthma or knowledge of premature birth.  Sleeping ok without nocturnal  or early am exacerbation  of respiratory  c/o's or need for noct saba. Also denies any obvious fluctuation of symptoms with weather or environmental changes or other aggravating or alleviating factors except as outlined above   Current Medications, Allergies, Complete Past Medical History, Past Surgical History, Family History, and Social History were reviewed in [employer] record.  ROS  The following are not active complaints unless bolded sore throat, dysphagia, dental problems, itching, sneezing,  nasal congestion or excess/ purulent secretions, ear ache,   fever, chills, sweats, unintended wt loss, pleuritic or exertional cp, hemoptysis,   orthopnea pnd or leg swelling, presyncope, palpitations, heartburn, abdominal pain, anorexia, nausea, vomiting, diarrhea  or change in bowel or urinary habits, change in stools or urine, dysuria,hematuria,  rash, arthralgias, visual complaints, headache, numbness weakness or ataxia or problems with walking or coordination,  change in mood/affect or memory.                           .      Objective:       Physical Exam GEN: A/Ox3; pleasant , NAD, elderly , thin   Wt Readings from Last 3 Encounters:  03/29/14 183 lb (83.008 kg)  03/27/14 173 lb (78.472 kg)  02/21/14 178 lb 6.4 oz (80.922 kg)    Vital signs reviewed    HEENT:  [place]/AT,  EACs-clear, TMs-wnl, NOSE-clear, THROAT-clear, no lesions, no postnasal drip or exudate noted.   NECK:  Supple w/ fair ROM; no JVD; normal carotid impulses w/o bruits; no thyromegaly or nodules palpated; no lymphadenopathy.  RESP  Mid Exp wheezing bilaterally much better with plm, no stridor, talks in complete sentences.  no accessory muscle use, no dullness to percussion  CARD:  RRR, no m/r/g  , no peripheral edema, pulses intact, no cyanosis or clubbing.  GI:   Soft & nt; nml bowel sounds; no organomegaly or masses detected.  Musco: Warm bil, no deformities or joint swelling noted.   Neuro: alert, no focal deficits noted.    Skin: Warm, no lesions or rashes     03/27/14 1. Hyperinflation and bronchitic changes. 2. No focal acute pulmonary abnormality.       Assessment:

## 2014-03-29 NOTE — Patient Instructions (Addendum)
See calendar for specific medication instructions and bring it back for each and every office visit for every healthcare provider you see.  Without it,  you may not receive the best quality medical care that we feel you deserve.  You will note that the calendar groups together  your maintenance  medications that are timed at particular times of the day.  Think of this as your checklist for what your doctor has instructed you to do until your next evaluation to see what benefit  there is  to staying on a consistent group of medications intended to keep you well.  The other group at the bottom is entirely up to you to use as you see fit  for specific symptoms that may arise between visits that require you to treat them on an as needed basis.  Think of this as your action plan or "what if" list.   Separating the top medications from the bottom group is fundamental to providing you adequate care going forward.    Whenever need the nebulizer more than once a day then use prednisone 20 mg per day until you don't  then 10 mg per day x 5 days.  See Tammy NP in 4 weeks with all your medications, even over the counter meds, separated in two separate bags, the ones you take no matter what vs the ones you stop once you feel better and take only as needed when you feel you need them.   Tammy  will generate for you a new user friendly medication calendar that will put [place] all on the same page re: your medication use.

## 2014-03-30 ENCOUNTER — Telehealth: Payer: Self-pay | Admitting: *Deleted

## 2014-03-30 NOTE — Assessment & Plan Note (Addendum)
-   PFTs 01/24/2014  FEV1  1.30 (35%) ratio 49 with severe air trapping and no better p saba and dlco 48% -01/24/2014 p extensive coaching HFA effectiveness =    90%  - referred to rehab 01/24/14  -Med calendar 02/21/2014   I had an extended discussion with the patient and his fiancee reviewing all relevant studies completed to date and  lasting 15 to 20 minutes of a 25 minute visit on the following ongoing concerns:   1) the med calendar is a checklist and an action plan and needs to be reviewed by him daily to make sure we are on the same page  2) he has been abusing saba for months if not years  3) new rec: anytime he needs the saba more than once a day in neb form, rx pred 20 mg per day until he doesn't, then 10 mg per day x 5 days and stop  4) The proper method of use, as well as anticipated side effects, of a metered-dose inhaler are discussed and demonstrated to the patient. Improved effectiveness after extensive coaching during this visit to a level of approximately  90% s spacer so should not use one  5) continue rehab

## 2014-03-30 NOTE — Telephone Encounter (Signed)
LMTCB

## 2014-03-30 NOTE — Telephone Encounter (Signed)
-----   Message from Tanda Rockers, MD sent at 03/30/2014  6:04 AM EST ----- I see a spacer on his med sheet.  The technique he demonstrated correctly yesterday for me was not with a spacer so should not use one

## 2014-03-31 ENCOUNTER — Encounter (HOSPITAL_COMMUNITY)
Admission: RE | Admit: 2014-03-31 | Discharge: 2014-03-31 | Disposition: A | Discharge status: Home / Self Care | Payer: Non-veteran care | Source: Ambulatory Visit | Attending: Internal Medicine | Admitting: Internal Medicine

## 2014-03-31 NOTE — Progress Notes (Signed)
Today, Crystal exercised at [employer]. [facility]. Service time was from 10:30am to 12:10pm.  The patient exercised for more than 31 minutes performing aerobic, strengthening, and stretching exercises. Oxygen saturation, heart rate, blood pressure, rate of perceived exertion, and shortness of breath were all monitored before, during, and after exercise. Yakub presented with no problems at today's exercise session. Patient attended education class on Pursed Lip and Diaphragmatic breathing with Benigna Delisi.  There was no workload change during today's exercise session.  Pre-exercise vitals: . Weight kg: 77.5 . Liters of O2: ra . SpO2: 95 . HR: 54 . BP: 124/70 . CBG: na  Exercise vitals: . Highest heartrate:  80 . Lowest oxygen saturation: 96 . Highest blood pressure: 124/80 . Liters of 02: a  Post-exercise vitals: . SpO2: 92 . HR: 70 . BP: 112/60 . Liters of O2: ra . CBG: na  Dr. Brand Males, Medical Director [name] is immediately available during today's Pulmonary Rehab session for [name] on 03/31/14 at 10:30am class time.

## 2014-04-01 NOTE — Telephone Encounter (Signed)
Spoke with the pt and notified of recs per MW  He verbalized understanding  Nothing further needed  

## 2014-04-05 ENCOUNTER — Encounter (HOSPITAL_COMMUNITY)
Admission: RE | Admit: 2014-04-05 | Discharge: 2014-04-05 | Disposition: A | Discharge status: Home / Self Care | Payer: Non-veteran care | Source: Ambulatory Visit | Attending: Internal Medicine | Admitting: Internal Medicine

## 2014-04-05 NOTE — Progress Notes (Signed)
Today, [name] exercised at [facility] [facility]. Service time was from 1030 to 1200.  The patient exercised for more than 31 minutes performing aerobic, strengthening, and stretching exercises. Oxygen saturation, heart rate, blood pressure, rate of perceived exertion, and shortness of breath were all monitored before, during, and after exercise. [name] presented with no problems at today's exercise session.   There was no workload change during today's exercise session.  Pre-exercise vitals: . Weight kg: 78.7 . Liters of O2: RA . SpO2: 93 . HR: 66 . BP: 116/60 . CBG: na  Exercise vitals: . Highest heartrate:  101 . Lowest oxygen saturation: 91 . Highest blood pressure: 100/60 . Liters of 02: RA  Post-exercise vitals: . SpO2: 94 . HR: 82 . BP: 122/76 . Liters of O2: RA . CBG: na Dr. Brand Males, Medical Director [name] is immediately available during today's Pulmonary Rehab session for Jeremiah [name] on 04/05/2014 at 1030 class time.  [name] Kitchen

## 2014-04-07 ENCOUNTER — Encounter (HOSPITAL_COMMUNITY)
Admission: RE | Admit: 2014-04-07 | Discharge: 2014-04-07 | Disposition: A | Discharge status: Home / Self Care | Payer: Non-veteran care | Source: Ambulatory Visit | Attending: Internal Medicine | Admitting: Internal Medicine

## 2014-04-07 NOTE — Progress Notes (Signed)
Today, Jeremiah [name] exercised at [facility]. Service [name] was from 1015 to 1200.  The patient exercised for more than 31 minutes performing aerobic, strengthening, and stretching exercises. Oxygen saturation, heart rate, blood pressure, rate of perceived exertion, and shortness of breath were all monitored before, during, and after exercise. [name] presented with no problems at today's exercise session.   There was no workload change during today's exercise session.  Pre-exercise vitals: . Weight kg: 78.1 . Liters of O2: RA . SpO2: 96 . HR: 60 . BP: 114/60 . CBG: na  Exercise vitals: . Highest heartrate:  96 . Lowest oxygen saturation: 94 . Highest blood pressure: 126/62 . Liters of 02: ra  Post-exercise vitals: . SpO2: 96 . HR: 68 . BP: 108/56 . Liters of O2: ra . CBG: na Dr. Brand Males, Medical Director Dr. Candiss Norse is immediately available during today's Pulmonary Rehab session for Jeremiah [name].  [name] Kitchen

## 2014-04-12 ENCOUNTER — Encounter (HOSPITAL_COMMUNITY)
Admission: RE | Admit: 2014-04-12 | Discharge: 2014-04-12 | Disposition: A | Discharge status: Home / Self Care | Payer: Non-veteran care | Source: Ambulatory Visit | Attending: Internal Medicine | Admitting: Internal Medicine

## 2014-04-12 NOTE — Progress Notes (Signed)
Today, Jeremiah [name] exercised at [facility]. Service time was from 1030 to 1150.  The patient exercised for more than 31 minutes performing aerobic, strengthening, and stretching exercises. Oxygen saturation, heart rate, blood pressure, rate of perceived exertion, and shortness of breath were all monitored before, during, and after exercise. Jeremiah [name] presented with no problems at today's exercise session.   There was one workload change during today's exercise session.  Pre-exercise vitals: . Weight kg: 80.8 . Liters of O2: RA . SpO2: 97 . HR: 65 . BP: 114/62 . CBG: na  Exercise vitals: . Highest heartrate:  85 . Lowest oxygen saturation: 93 . Highest blood pressure: 128/74 . Liters of 02: RA  Post-exercise vitals: . SpO2: 96 . HR: 72 . BP: 108/60 . Liters of O2: RA . CBG: na Dr. Brand Males, Medical Director Dr. Candiss Norse is immediately available during today's Pulmonary Rehab session for Jeremiah [name] on 04/12/2014 at 1030 class time.  [name] Kitchen

## 2014-04-14 ENCOUNTER — Encounter (HOSPITAL_COMMUNITY)
Admission: RE | Admit: 2014-04-14 | Discharge: 2014-04-14 | Disposition: A | Discharge status: Home / Self Care | Payer: Non-veteran care | Source: Ambulatory Visit | Attending: Internal Medicine | Admitting: Internal Medicine

## 2014-04-14 NOTE — Progress Notes (Addendum)
Today, Jeremiah [name] exercised at [facility]. Service time was from 10:30am to 12:30pm.  The patient exercised for more than 31 minutes performing aerobic, strengthening, and stretching exercises. Oxygen saturation, heart rate, blood pressure, rate of perceived exertion, and shortness of breath were all monitored before, during, and after exercise. Jeremiah [name] presented with no problems at today's exercise session. Patient attended Warning Signs and Symptoms education with Rosebud Poles.  There was no workload change during today's exercise session.  Pre-exercise vitals: . Weight kg: 79.5 . Liters of O2: ra . SpO2: 94 . HR: 62 . BP: 122/72 . CBG: na  Exercise vitals: . Highest heartrate:  78 . Lowest oxygen saturation: 93 . Highest blood pressure: 110/60 . Liters of 02: ra  Post-exercise vitals: . SpO2: 95 . HR: 79 . BP: 110/60 . Liters of O2: ra . CBG: na  Dr. Brand Males, Medical Director Dr. Candiss Norse is immediately available during today's Pulmonary Rehab session for Jeremiah [name] on 04/14/14 at 10:30am class time.

## 2014-04-19 ENCOUNTER — Encounter (HOSPITAL_COMMUNITY)
Admission: RE | Admit: 2014-04-19 | Discharge: 2014-04-19 | Disposition: A | Discharge status: Home / Self Care | Payer: Non-veteran care | Source: Ambulatory Visit | Attending: Internal Medicine | Admitting: Internal Medicine

## 2014-04-19 DIAGNOSIS — J439 Emphysema, unspecified: Secondary | ICD-10-CM | POA: Insufficient documentation

## 2014-04-19 NOTE — Progress Notes (Signed)
Today, Jeremiah [name] exercised at [facility]. Service time was from 10:30am to 12:05pm.  The patient exercised for more than 31 minutes performing aerobic, strengthening, and stretching exercises. Oxygen saturation, heart rate, blood pressure, rate of perceived exertion, and shortness of breath were all monitored before, during, and after exercise. Jeremiah [name] presented with no problems at today's exercise session.   There was no workload change during today's exercise session.  Pre-exercise vitals: . Weight kg: 80.9 . Liters of O2: ra . SpO2: 96 . HR: 73 . BP: 110/64 . CBG: na  Exercise vitals: . Highest heartrate:  85 . Lowest oxygen saturation: 95 . Highest blood pressure: 146/60 . Liters of 02: ra  Post-exercise vitals: . SpO2: 95 . HR: 84 . BP: 108/60 . Liters of O2: ra . CBG: na  Dr. Brand Males, Medical Director Dr. Coralyn Pear is immediately available during today's Pulmonary Rehab session for Jeremiah [name] on 04/19/14 at 10:30am class time.

## 2014-04-21 ENCOUNTER — Encounter (HOSPITAL_COMMUNITY)
Admission: RE | Admit: 2014-04-21 | Discharge: 2014-04-21 | Disposition: A | Discharge status: Home / Self Care | Payer: Non-veteran care | Source: Ambulatory Visit | Attending: Internal Medicine | Admitting: Internal Medicine

## 2014-04-21 ENCOUNTER — Encounter (HOSPITAL_COMMUNITY): Payer: Self-pay

## 2014-04-21 NOTE — Progress Notes (Signed)
Today, Jeremiah [name] exercised at [facility]. Service time was from 10:30am to 12:20pm.  The patient exercised for more than 31 minutes performing aerobic, strengthening, and stretching exercises. Oxygen saturation, heart rate, blood pressure, rate of perceived exertion, and shortness of breath were all monitored before, during, and after exercise. Jeremiah [name] presented with no problems at today's exercise session. Patient attended education class with Kawanda Drumheller on "Risk Factor Reduction."  There was no workload change during today's exercise session.  Pre-exercise vitals: . Weight kg: 81.0 . Liters of O2: ra . SpO2: 96 . HR: 63 . BP: 118/60 . CBG: na  Exercise vitals: . Highest heartrate:  82 . Lowest oxygen saturation: 96 . Highest blood pressure: 144/80 . Liters of 02: ra  Post-exercise vitals: . SpO2: 96 . HR: 73 . BP: 104/70 . Liters of O2: ra . CBG: na  Dr. Brand Males, Medical Director Dr. Coralyn Pear is immediately available during today's Pulmonary Rehab session for Jeremiah [name] on 04/21/14 at 10:30am class time.

## 2014-04-26 ENCOUNTER — Ambulatory Visit (INDEPENDENT_AMBULATORY_CARE_PROVIDER_SITE_OTHER): Payer: Non-veteran care | Admitting: Adult Health

## 2014-04-26 ENCOUNTER — Encounter (HOSPITAL_COMMUNITY): Payer: Non-veteran care

## 2014-04-26 ENCOUNTER — Encounter: Payer: Self-pay | Admitting: Adult Health

## 2014-04-26 VITALS — BP 120/76 | HR 67 | Ht 73.5 in | Wt 180.4 lb

## 2014-04-26 DIAGNOSIS — G4733 Obstructive sleep apnea (adult) (pediatric): Secondary | ICD-10-CM | POA: Diagnosis not present

## 2014-04-26 DIAGNOSIS — J449 Chronic obstructive pulmonary disease, unspecified: Secondary | ICD-10-CM

## 2014-04-26 NOTE — Assessment & Plan Note (Signed)
Compensated on present regimen  Patient's medications were reviewed today and patient education was given. Computerized medication calendar was adjusted/completed  Doing well in pulmonary rehab   Plan  Continue on Symbicort and Spiriva .  Follow med calendar closely and bring to each visit.  Follow up Dr. Melvyn Novas  In 3 months and As needed   Please contact office for sooner follow up if symptoms do not improve or worsen or seek emergency care

## 2014-04-26 NOTE — Progress Notes (Signed)
Subjective:     Patient ID: Jeremiah [name], male   DOB: 1945-11-03   MRN: [id number]    Brief patient profile:  67 yobm quit smoking 1989 with no problems with variable sob x 2004 and maintained on inhalers referred 05/03/2013 by Dr Titus Mould for evaluation of outpt management for copd after hospitalization.  Admit date: 04/24/2013  Discharge date: 04/27/2013  Discharge Diagnoses:  Active Problems:  COPD exacerbation  Detailed Hospital Course:  69 yo male with abrupt onset of sob (1/10) that progressively worsened until EMS called. Patient intubated in ED due to severe respiratory distress. In route, patient had increasing tachypnea, decline SpO2, and obtundation. Patient with GCS <8 at arrival, evidence of emesis, intubated for severe respiratory distress. Family reported no evidence of fevers, chills, malaise, worsening coughing, and sob. Initial episode passed, however sister received a call later with him reporting a return of symptoms, complaints of inability of breathing and EMS alerted. Distant history of tobacco abuse. Has never been hospitalized for COPD before this episode. He has had no recent hospitalizations, and has never been previously intubated.  He was admitted to the ICU on 1/11 and was treated with the typical modalities for acute respiratory failure (for presumed AECODP) which included mechanical ventilation with sedation, inhaled bronchodilators, antibiotics, and systemic steroid therapy. He improved later that day and was able to be extubated and sedation was stopped. His care since that point has primarily focused on the de-escalation of the therapies mentioned above. On 1/13 he has returned to a near-baseline state of health and has been deemed a candidate for discharge.  Discharge Plan by diagnoses  Acute exacerbation of COPD  Acute hypercarbic respiratory failure (resolved)  plan  -Follow up with Dr. Melvyn Novas on 05/03/2013 at 2:30PM  -Change steroids to PO Prednisone, taper over  7-10d  -Resume home dose BDs    History of Present Illness  05/03/2013 1st [facility] office visit/ Wert cc more sob since Oct 2014 on symbicort 160 2 bid and spiriva daily  But still  daily use of proventil and no need for neb then needed neb x sev months prior to his admit above but back to baseline since d/c on pred still tapering off but using saba qid, not prn "out of habit" >>return for PFT   01/03/2014 Follow up  [name] .  Complains of increased episodes of increased DOE, wheezing, prod cough with white/clear mucus worse for last week. Family member says he has been progressively get worse over last year. Was admitted 04/2013 w/ COPD flare /VDRF . [name] from last office visit however did not follow up .   Followed at [facility] , says he was told he has Moderate COPD 6 yrs ago. On Symbicort and Spiriva.  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  Says he gets winded easily esp on incline.  Says worse for last week with increased cough and wheezing.  No hemotpysis, wt loss, calf pain, n/v/d, chest pain or fever.  Last abx ~2 months ago.  rec Doxycycline 100mg  Twice daily  For 7 days  Prednisone taper over next week Mucinex DM Twice daily  As needed  Cough /congestion  Continue on Symbicort and Spiriva .   01/24/2014 f/u ov/Wert re:  GOLD III Chief Complaint  Patient presents with  . Follow-up    PFT done today. Pt states  that SOB and cough have improved some, but not back to normal baseline. Cough is prod with minimal clear sputum.   can do HT but no mall due to sob Prednisone really helped a lot but did not maintain improvement once finished >>pred taper add ppi bid and diet    02/21/2014 Follow up and Medication Review  Patient returns for follow-up and medication review. We reviewed all his medications organize them into a medication count with patient  education. It appears that he is take his medications correctly. Has begun pulmonary rehab, really likes it.  Patient has COPD flare last time was treated with prednisone. Feels that he did help. rec no change rx/ follow med calendar     03/29/2014 f/u ov/Wert re: GOLD III/ no med calendar/ misunderstanding saba  Chief Complaint  Patient presents with  . Follow-up    Pt states went to ED 03/27/14 and dxed with acute COPD exacerbation. He states that he started noticing having increased SOB on 03/25/14.  He states that today his breathing is doing much better, and feels he is almost back to his normal baseline.    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 He denies any mass leaks or excessive daytime sleepiness Patient denies any hemoptysis, chest pain, orthopnea, PND or leg swelling Remains on Symbicort and Spiriva. He is on chronic steroids with a baseline prednisone at 10 mg Feels breathing is doing better, loves pulmonary rehab.  'Is determined to not end up on O2"     Current Medications, Allergies, Complete Past Medical History, Past Surgical History, Family History, and Social History were reviewed in [employer] record.  ROS  The following are not active  complaints unless bolded sore throat, dysphagia, dental problems, itching, sneezing,  nasal congestion or excess/ purulent secretions, ear ache,   fever, chills, sweats, unintended wt loss, pleuritic or exertional cp, hemoptysis,  orthopnea pnd or leg swelling, presyncope, palpitations, heartburn, abdominal pain, anorexia, nausea, vomiting, diarrhea  or change in bowel or urinary habits, change in stools or urine, dysuria,hematuria,  rash, arthralgias, visual complaints, headache, numbness weakness or ataxia or problems with walking or coordination,  change in mood/affect or memory.          .      Objective:       Physical Exam GEN: A/Ox3; pleasant , NAD, elderly , thin    Vital signs reviewed    HEENT:  [place]/AT,  EACs-clear, TMs-wnl, NOSE-clear, THROAT-clear, no lesions, no postnasal drip or exudate noted.   NECK:  Supple w/ fair ROM; no JVD; normal carotid impulses w/o bruits; no thyromegaly or nodules palpated; no lymphadenopathy.  RESP  CTA bilaterally , no wheezing no accessory muscle use, no dullness to percussion  CARD:  RRR, no m/r/g  ,  no peripheral edema, pulses intact, no cyanosis or clubbing.  GI:   Soft & nt; nml bowel sounds; no organomegaly or masses detected.  Musco: Warm bil, no deformities or joint swelling noted.   Neuro: alert, no focal deficits noted.    Skin: Warm, no lesions or rashes     03/27/14 1. Hyperinflation and bronchitic changes. 2. No focal acute pulmonary abnormality.       Assessment:

## 2014-04-26 NOTE — Assessment & Plan Note (Signed)
Doing well on CPAP   Plan  Cont on CPAP  

## 2014-04-26 NOTE — Patient Instructions (Signed)
Continue on Symbicort and Spiriva .  Follow med calendar closely and bring to each visit.  Follow up Dr. Melvyn Novas  In 3 months and As needed   Please contact office for sooner follow up if symptoms do not improve or worsen or seek emergency care

## 2014-04-28 ENCOUNTER — Encounter (HOSPITAL_COMMUNITY): Admission: RE | Admit: 2014-04-28 | Payer: Non-veteran care | Source: Ambulatory Visit

## 2014-05-03 ENCOUNTER — Encounter (HOSPITAL_COMMUNITY)
Admission: RE | Admit: 2014-05-03 | Discharge: 2014-05-03 | Disposition: A | Discharge status: Home / Self Care | Payer: Non-veteran care | Source: Ambulatory Visit | Attending: Internal Medicine | Admitting: Internal Medicine

## 2014-05-03 NOTE — Progress Notes (Signed)
Today, Jeremiah [name] exercised at [facility]. Service time was from 10:30am to 12:05pm.  The patient exercised for more than 31 minutes performing aerobic, strengthening, and stretching exercises. Oxygen saturation, heart rate, blood pressure, rate of perceived exertion, and shortness of breath were all monitored before, during, and after exercise. Jeremiah [name] presented with no problems at today's exercise session.   There was no workload change during today's exercise session.  Pre-exercise vitals: . Weight kg: 80.5 . Liters of O2: ra . SpO2: 95 . HR: 61 . BP: 120/74 . CBG: na  Exercise vitals: . Highest heartrate:  88 . Lowest oxygen saturation: 95 . Highest blood pressure: 136/70 . Liters of 02: ra  Post-exercise vitals: . SpO2: 96 . HR: 69 . BP: 130/74 . Liters of O2: ra . CBG: na  Dr. Brand Males, Medical Director Dr. Algis Liming is immediately available during today's Pulmonary Rehab session for Jeremiah [name] on 05/03/14 at 10:30am class time.

## 2014-05-05 ENCOUNTER — Encounter (HOSPITAL_COMMUNITY)
Admission: RE | Admit: 2014-05-05 | Discharge: 2014-05-05 | Disposition: A | Discharge status: Home / Self Care | Payer: Non-veteran care | Source: Ambulatory Visit | Attending: Internal Medicine | Admitting: Internal Medicine

## 2014-05-05 NOTE — Progress Notes (Signed)
Today, [name] exercised at [facility]. Service time was from 10:30am to 12:30pm.  The patient exercised for more than 31 minutes performing aerobic, strengthening, and stretching exercises. Oxygen saturation, heart rate, blood pressure, rate of perceived exertion, and shortness of breath were all monitored before, during, and after exercise. Jeremiah [name] presented with no problems at today's exercise session. The patient attended education today with Trish Fountain on "Oxygen Use and Safety."  There was no workload change during today's exercise session.  Pre-exercise vitals: . Weight kg: 81.5 . Liters of O2: a . SpO2: 97 . HR: 54 . BP: 124/66 . CBG: na  Exercise vitals: . Highest heartrate:  73 . Lowest oxygen saturation: 95 . Highest blood pressure: 144/70 . Liters of 02: ra  Post-exercise vitals: . SpO2: 97 . HR: 61 . BP: 114/56 . Liters of O2: 10 . CBG: na  Dr. Brand Males, Medical Director Dr. Daleen Bo is immediately available during today's Pulmonary Rehab session for Jeremiah [name] on 05/05/14 at 10:30am class time.

## 2014-05-10 ENCOUNTER — Encounter (HOSPITAL_COMMUNITY)
Admission: RE | Admit: 2014-05-10 | Discharge: 2014-05-10 | Disposition: A | Discharge status: Home / Self Care | Payer: Non-veteran care | Source: Ambulatory Visit | Attending: Internal Medicine | Admitting: Internal Medicine

## 2014-05-10 NOTE — Progress Notes (Signed)
Jeremiah [name] completed a Six-Minute Walk Test on 05/10/14 . Jeremiah [name] walked 1199 feet with 0 breaks. This is an improvement from his Six-Minute Walk Test on 02/15/14 walking 741 ft.  The patient's lowest oxygen saturation was 94 , highest heart rate was 82 , and highest blood pressure was 138/80. The patient was on room air. Jeremiah [name] stated that nothing hindered their walk test.

## 2014-05-12 ENCOUNTER — Encounter (HOSPITAL_COMMUNITY): Admission: RE | Admit: 2014-05-12 | Payer: Non-veteran care | Source: Ambulatory Visit

## 2014-05-13 ENCOUNTER — Other Ambulatory Visit: Payer: Self-pay | Admitting: Internal Medicine

## 2014-05-17 ENCOUNTER — Encounter (HOSPITAL_COMMUNITY): Payer: Non-veteran care

## 2014-05-17 NOTE — Telephone Encounter (Signed)
Please advise if you are okay with refilling this again  Last given # 60 on 03/29/14

## 2014-05-19 ENCOUNTER — Encounter (HOSPITAL_COMMUNITY): Payer: Non-veteran care

## 2014-05-19 ENCOUNTER — Encounter (HOSPITAL_COMMUNITY)
Admission: RE | Admit: 2014-05-19 | Discharge: 2014-05-19 | Disposition: A | Discharge status: Home / Self Care | Payer: Non-veteran care | Source: Ambulatory Visit | Attending: Internal Medicine | Admitting: Internal Medicine

## 2014-05-19 DIAGNOSIS — J439 Emphysema, unspecified: Secondary | ICD-10-CM | POA: Diagnosis not present

## 2014-05-19 NOTE — Addendum Note (Signed)
Addended by: Parke Poisson E on: 05/19/2014 01:16 PM   Modules accepted: Orders, Medications

## 2014-05-24 ENCOUNTER — Encounter (HOSPITAL_COMMUNITY): Payer: Non-veteran care

## 2014-05-24 ENCOUNTER — Encounter (HOSPITAL_COMMUNITY)
Admission: RE | Admit: 2014-05-24 | Discharge: 2014-05-24 | Disposition: A | Discharge status: Home / Self Care | Payer: Non-veteran care | Source: Ambulatory Visit | Attending: Internal Medicine | Admitting: Internal Medicine

## 2014-05-26 ENCOUNTER — Encounter (HOSPITAL_COMMUNITY): Admission: RE | Admit: 2014-05-26 | Payer: Non-veteran care | Source: Ambulatory Visit

## 2014-05-30 ENCOUNTER — Encounter (HOSPITAL_COMMUNITY): Payer: Self-pay

## 2014-05-30 NOTE — Progress Notes (Signed)
Pulmonary Rehabilitation Discharge Note: Ephriam has been discharged from pulmonary rehabilitation after successfully completing 20 exercise and education sessions. Advait met his goals of learning how to manage his anxiety associated with his SOB and has now been prescribed an antianxiety medication to use as needed, which he states helps a lot. Knowing how to PLB and utilizing that technique has also helped his anxiety. Michael did obtain a pulse ox monitor and has become diligent with its use to monitor his oxygen saturations. Brion also wanted to increase his stamina and strength and to be able to climb stairs with better ease.  He stated he has met these goals as evidenced by his ability to attend college football games and walk up the stadium steps with better ease and less SOB. Nazeer also walked an additional 458 feet during his post 6 min walk test as compared to his initial 6 min test. Park continues to exercise post discharge 2 days a week in our self pay maintenance pulmonary rehab program. He is also exercising at home on his "off days". It was a pleasure having Oda in our program.

## 2014-05-31 ENCOUNTER — Encounter (HOSPITAL_COMMUNITY)
Admission: RE | Admit: 2014-05-31 | Discharge: 2014-05-31 | Disposition: A | Discharge status: Home / Self Care | Payer: Non-veteran care | Source: Ambulatory Visit | Attending: Internal Medicine | Admitting: Internal Medicine

## 2014-06-02 ENCOUNTER — Encounter (HOSPITAL_COMMUNITY)
Admission: RE | Admit: 2014-06-02 | Discharge: 2014-06-02 | Disposition: A | Discharge status: Home / Self Care | Payer: Non-veteran care | Source: Ambulatory Visit | Attending: Internal Medicine | Admitting: Internal Medicine

## 2014-06-03 ENCOUNTER — Ambulatory Visit (INDEPENDENT_AMBULATORY_CARE_PROVIDER_SITE_OTHER): Payer: 59 | Admitting: Internal Medicine

## 2014-06-03 ENCOUNTER — Encounter: Payer: Self-pay | Admitting: Internal Medicine

## 2014-06-03 VITALS — BP 140/86 | HR 70 | Ht 73.5 in | Wt 182.0 lb

## 2014-06-03 DIAGNOSIS — J449 Chronic obstructive pulmonary disease, unspecified: Secondary | ICD-10-CM | POA: Diagnosis not present

## 2014-06-03 MED ORDER — PREDNISONE 10 MG PO TABS: ORAL_TABLET | ORAL | Status: DC

## 2014-06-03 NOTE — Patient Instructions (Addendum)
Prednisone 10 mg take  4 each am x 2 days,   2 each am x 2 days,  1 each am x 2 days and stop   Plan A = Automatic = symbicort 160 Take 2 puffs first thing in am and then another 2 puffs about 12 hours later.                                      Spiriva one capsule each am   Work on perfecting  inhaler technique:  relax and gently blow all the way out then take a nice smooth deep breath back in, triggering the inhaler at same time you start breathing in.  Hold for up to 5 seconds if you can.  Rinse and gargle with water when done.  Plan B = Only use your albuterol (proventil)  as a rescue medication to be used if you can't catch your breath by resting or doing a relaxed purse lip breathing pattern.  - The less you use it, the better it will work when you need it. - Ok to use up to 2 puffs  every 4 hours if you must but call for immediate appointment if use goes up over your usual need - Don't leave home without it !!  (think of it like the spare tire for your car)   Plan C = nebulizer = Albuterol ok to to use up to every 4 hours if needed but call right away for appt   See Tammy NP in 10month with all your medications, even over the counter meds, separated in two separate bags, the ones you take no matter what vs the ones you stop once you feel better and take only as needed when you feel you need them.   Tammy  will generate for you a new user friendly medication calendar that will put [place] all on the same page re: your medication use.     Without this process, it simply isn't possible to assure that we are providing  your outpatient care  with  the attention to detail we feel you deserve.   If we cannot assure that you're getting that kind of care,  then we cannot manage your problem effectively from this clinic.  Once you have seen Tammy and we are sure that we're all on the same page with your medication use she will arrange follow up with me.  Late add consider change to tudorza if still saba  dep in am

## 2014-06-03 NOTE — Progress Notes (Signed)
Subjective:     Patient ID: Jeremiah [name], male   DOB: 1945/06/03   MRN: [id number]    Brief patient profile:  67 yobm quit smoking 1989 with no problems with variable sob x 2004 and maintained on inhalers referred 05/03/2013 by Dr Titus Mould for evaluation of outpt management for copd after hospitalization.  Admit date: 04/24/2013  Discharge date: 04/27/2013  Discharge Diagnoses:  Active Problems:  COPD exacerbation  Detailed Hospital Course:  69 yo male with abrupt onset of sob (1/10) that progressively worsened until EMS called. Patient intubated in ED due to severe respiratory distress. In route, patient had increasing tachypnea, decline SpO2, and obtundation. Patient with GCS <8 at arrival, evidence of emesis, intubated for severe respiratory distress. Family reported no evidence of fevers, chills, malaise, worsening coughing, and sob. Initial episode passed, however sister received a call later with him reporting a return of symptoms, complaints of inability of breathing and EMS alerted. Distant history of tobacco abuse. Has never been hospitalized for COPD before this episode. He has had no recent hospitalizations, and has never been previously intubated.  He was admitted to the ICU on 1/11 and was treated with the typical modalities for acute respiratory failure (for presumed AECODP) which included mechanical ventilation with sedation, inhaled bronchodilators, antibiotics, and systemic steroid therapy. He improved later that day and was able to be extubated and sedation was stopped. His care since that point has primarily focused on the de-escalation of the therapies mentioned above. On 1/13 he has returned to a near-baseline state of health and has been deemed a candidate for discharge.  Discharge Plan by diagnoses  Acute exacerbation of COPD  Acute hypercarbic respiratory failure (resolved)  plan  -Follow up with Dr. Melvyn Novas on 05/03/2013 at 2:30PM  -Change steroids to PO Prednisone, taper over  7-10d  -Resume home dose BDs    History of Present Illness  05/03/2013 1st [facility] office visit/ Donita Newland cc more sob since Oct 2014 on symbicort 160 2 bid and spiriva daily  But still  daily use of proventil and no need for neb then needed neb x sev months prior to his admit above but back to baseline since d/c on pred still tapering off but using saba qid, not prn "out of habit" >>return for PFT   01/03/2014 Follow up  [name] .  Complains of increased episodes of increased DOE, wheezing, prod cough with white/clear mucus worse for last week. Family member says he has been progressively get worse over last year. Was admitted 04/2013 w/ COPD flare /VDRF . [name] from last office visit however did not follow up .   Followed at [facility] , says he was told he has Moderate COPD 6 yrs ago. On Symbicort and Spiriva.  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  Says he gets winded easily esp on incline.  Says worse for last week with increased cough and wheezing.  No hemotpysis, wt loss, calf pain, n/v/d, chest pain or fever.  Last abx ~2 months ago.  rec Doxycycline 100mg  Twice daily  For 7 days  Prednisone taper over next week Mucinex DM Twice daily  As needed  Cough /congestion  Continue on Symbicort and Spiriva .   01/24/2014 f/u ov/Viola Placeres re:  GOLD III Chief Complaint  Patient presents with  . Follow-up    PFT done today. Pt states  that SOB and cough have improved some, but not back to normal baseline. Cough is prod with minimal clear sputum.   can do HT but no mall due to sob Prednisone really helped a lot but did not maintain improvement once finished >>pred taper add ppi bid and diet    02/21/2014 Follow up and Medication Review  Patient returns for follow-up and medication review. We reviewed all his medications organize them into a medication count with patient  education. It appears that he is take his medications correctly. Has begun pulmonary rehab, really likes it.  Patient has COPD flare last time was treated with prednisone. Feels that he did help. rec no change rx/ follow med calendar     03/29/2014 f/u ov/Phares Zaccone re: GOLD III/ no med calendar/ misunderstanding saba  Chief Complaint  Patient presents with  . Follow-up    Pt states went to ED 03/27/14 and dxed with acute COPD exacerbation. He states that he started noticing having increased SOB on 03/25/14.  He states that today his breathing is doing much better, and feels he is almost back to his normal baseline.    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 He denies any mass leaks or excessive daytime sleepiness Patient denies any hemoptysis, chest pain, orthopnea, PND or leg swelling Remains on Symbicort and Spiriva. He is on chronic steroids with a baseline prednisone at 10 mg Feels breathing is doing better, loves pulmonary rehab.  'Is determined to not end up on O2" rec Continue on Symbicort and Spiriva .  Follow med calendar closely and bring to each visit.     06/03/2014 f/u ov/Evellyn Tuff re: GOLD III/ no med calendar / confused again with instructions maint vs prns Chief Complaint   Patient presents with  . Follow-up    Pt c/o cough in the am for the past 2 wks. Cough is non prod. His breathing has improved.    cough worse in am and typically used saba before symbicort  No obvious day to day or daytime variabilty or assoc   cp or chest tightness, subjective wheeze overt sinus or hb symptoms. No unusual exp hx or h/o childhood pna/ asthma or knowledge of premature birth.  Sleeping ok without nocturnal  or early am exacerbation  of respiratory  c/o's or need for noct saba. Also denies any obvious fluctuation of symptoms with weather or environmental changes or other aggravating or alleviating factors except as outlined above   Current Medications, Allergies, Complete Past Medical History, Past Surgical History, Family History, and Social History were reviewed in [employer] record.  ROS  The following are not active complaints unless bolded sore throat, dysphagia, dental problems, itching, sneezing,  nasal congestion or excess/ purulent secretions, ear ache,   fever, chills, sweats,  unintended wt loss, pleuritic or exertional cp, hemoptysis,  orthopnea pnd or leg swelling, presyncope, palpitations, heartburn, abdominal pain, anorexia, nausea, vomiting, diarrhea  or change in bowel or urinary habits, change in stools or urine, dysuria,hematuria,  rash, arthralgias, visual complaints, headache, numbness weakness or ataxia or problems with walking or coordination,  change in mood/affect or memory.              .      Objective:       Physical Exam GEN: A/Ox3; pleasant , NAD, elderly , thin   Wt Readings from Last 3 Encounters:  06/03/14 82.555 kg (182 lb)  04/26/14 81.829 kg (180 lb 6.4 oz)  03/29/14 83.008 kg (183 lb)    Vital signs reviewed       HEENT:  [place]/AT,  EACs-clear, TMs-wnl, NOSE-clear, THROAT-clear, no lesions, no postnasal drip or exudate noted.   NECK:  Supple w/ fair ROM; no JVD; normal carotid impulses w/o bruits; no  thyromegaly or nodules palpated; no lymphadenopathy.  RESP  CTA bilaterally , no wheezing no accessory muscle use, no dullness to percussion  CARD:  RRR, no m/r/g  , no peripheral edema, pulses intact, no cyanosis or clubbing.  GI:   Soft & nt; nml bowel sounds; no organomegaly or masses detected.  Musco: Warm bil, no deformities or joint swelling noted.   Neuro: alert, no focal deficits noted.    Skin: Warm, no lesions or rashes     03/27/14 1. Hyperinflation and bronchitic changes. 2. No focal acute pulmonary abnormality.       Assessment:

## 2014-06-04 ENCOUNTER — Encounter: Payer: Self-pay | Admitting: Internal Medicine

## 2014-06-04 NOTE — Assessment & Plan Note (Addendum)
-   PFTs 01/24/2014  FEV1  1.30 (35%) ratio 49 with severe air trapping and no better p saba and dlco 48%  - referred to rehab 01/24/14  -  Med calendar 02/21/2014 > did not recognize it when reprinted 03/29/14 > reviewed   The proper method of use, as well as anticipated side effects, of a metered-dose inhaler are discussed and demonstrated to the patient. Improved effectiveness after extensive coaching during this visit to a level of approximately     90% from a baseline of 50%   I had an extended discussion with the patient reviewing all relevant studies completed to date and  lasting 15 to 20 minutes of a 25 minute visit on the following ongoing concerns:  1) he has severe copd and still struggling with med reconciliation  2) Each maintenance medication was reviewed in detail including most importantly the difference between maintenance and as needed and under what circumstances the prns are to be used. This was done in the context of a medication calendar review which provided the patient with a user-friendly unambiguous mechanism for medication administration and reconciliation and provides an action plan for all active problems. It is critical that this be shown to every doctor  for modification during the office visit if necessary so the patient can use it as a working document.      3) may consider change to tudorza on next visit if can't reduce the need for am saba as works in am much faster than spiriva   See instructions for specific recommendations which were reviewed directly with the patient who was given a copy with highlighter outlining the key components.

## 2014-06-07 ENCOUNTER — Encounter (HOSPITAL_COMMUNITY)
Admission: RE | Admit: 2014-06-07 | Discharge: 2014-06-07 | Disposition: A | Discharge status: Home / Self Care | Payer: Non-veteran care | Source: Ambulatory Visit | Attending: Internal Medicine | Admitting: Internal Medicine

## 2014-06-09 ENCOUNTER — Encounter (HOSPITAL_COMMUNITY)
Admission: RE | Admit: 2014-06-09 | Discharge: 2014-06-09 | Disposition: A | Discharge status: Home / Self Care | Payer: Non-veteran care | Source: Ambulatory Visit | Attending: Internal Medicine | Admitting: Internal Medicine

## 2014-06-14 ENCOUNTER — Encounter (HOSPITAL_COMMUNITY)
Admission: RE | Admit: 2014-06-14 | Discharge: 2014-06-14 | Disposition: A | Discharge status: Home / Self Care | Payer: Self-pay | Source: Ambulatory Visit | Attending: Internal Medicine | Admitting: Internal Medicine

## 2014-06-14 DIAGNOSIS — J439 Emphysema, unspecified: Secondary | ICD-10-CM | POA: Insufficient documentation

## 2014-06-16 ENCOUNTER — Encounter (HOSPITAL_COMMUNITY): Payer: 59

## 2014-06-21 ENCOUNTER — Encounter (HOSPITAL_COMMUNITY)
Admission: RE | Admit: 2014-06-21 | Discharge: 2014-06-21 | Disposition: A | Discharge status: Home / Self Care | Payer: Self-pay | Source: Ambulatory Visit | Attending: Internal Medicine | Admitting: Internal Medicine

## 2014-06-23 ENCOUNTER — Encounter (HOSPITAL_COMMUNITY)
Admission: RE | Admit: 2014-06-23 | Discharge: 2014-06-23 | Disposition: A | Discharge status: Home / Self Care | Payer: Self-pay | Source: Ambulatory Visit | Attending: Internal Medicine | Admitting: Internal Medicine

## 2014-06-28 ENCOUNTER — Encounter (HOSPITAL_COMMUNITY)
Admission: RE | Admit: 2014-06-28 | Discharge: 2014-06-28 | Disposition: A | Discharge status: Home / Self Care | Payer: Self-pay | Source: Ambulatory Visit | Attending: Internal Medicine | Admitting: Internal Medicine

## 2014-06-28 ENCOUNTER — Telehealth: Payer: Self-pay | Admitting: Internal Medicine

## 2014-06-28 MED ORDER — FLUOXETINE HCL 20 MG PO CAPS: 20.0000 mg | ORAL_CAPSULE | Freq: Every day | ORAL | Status: DC

## 2014-06-28 NOTE — Telephone Encounter (Signed)
That's fine

## 2014-06-28 NOTE — Telephone Encounter (Signed)
Called made pt aware RX sent in. Nothing further needed 

## 2014-06-28 NOTE — Telephone Encounter (Signed)
[phone], pt cb

## 2014-06-28 NOTE — Telephone Encounter (Signed)
Called and spoke to pt. Pt requested a 10 day supply of Prozac. Pt's PCP [facility] PCP) orders pt's prozac but it will arrive for another 10 days. Pt is requesting 10 days supply sent to local pharmacy until he can receive the prescription from the [place].   Dr. Melvyn Novas please advise.

## 2014-06-28 NOTE — Telephone Encounter (Signed)
lmomtcb x1 

## 2014-06-30 ENCOUNTER — Encounter (HOSPITAL_COMMUNITY)
Admission: RE | Admit: 2014-06-30 | Discharge: 2014-06-30 | Disposition: A | Discharge status: Home / Self Care | Payer: Self-pay | Source: Ambulatory Visit | Attending: Internal Medicine | Admitting: Internal Medicine

## 2014-07-05 ENCOUNTER — Telehealth: Payer: Self-pay | Admitting: Internal Medicine

## 2014-07-05 ENCOUNTER — Encounter (HOSPITAL_COMMUNITY)
Admission: RE | Admit: 2014-07-05 | Discharge: 2014-07-05 | Disposition: A | Discharge status: Home / Self Care | Payer: Self-pay | Source: Ambulatory Visit | Attending: Internal Medicine | Admitting: Internal Medicine

## 2014-07-05 NOTE — Telephone Encounter (Signed)
Called and spoke to pt. Pt stated he was told by pulm rehab to let MW know he has be feeling stuffy related to seasonal allergies and to possible suggest an anti-histamine. Pt has appt on 3/24 with MW. Informed pt to wait till appt for MW to assess and discus with him. Pt verbalized understanding and denied any further questions or concerns at this time.

## 2014-07-07 ENCOUNTER — Ambulatory Visit (INDEPENDENT_AMBULATORY_CARE_PROVIDER_SITE_OTHER): Payer: 59 | Admitting: Internal Medicine

## 2014-07-07 ENCOUNTER — Encounter (INDEPENDENT_AMBULATORY_CARE_PROVIDER_SITE_OTHER): Payer: Self-pay

## 2014-07-07 ENCOUNTER — Encounter: Payer: Self-pay | Admitting: Internal Medicine

## 2014-07-07 VITALS — BP 128/80 | HR 66 | Ht 73.5 in | Wt 188.0 lb

## 2014-07-07 DIAGNOSIS — J449 Chronic obstructive pulmonary disease, unspecified: Secondary | ICD-10-CM | POA: Diagnosis not present

## 2014-07-07 NOTE — Patient Instructions (Signed)
If breathing worse and need the nebulizer more than rarely,go ahead and take Prednisone 10 mg take  4 each am x 2 days,   2 each am x 2 days,  1 each am x 2 days and stop   See calendar for specific medication instructions and bring it back for each and every office visit for every healthcare provider you see.  Without it,  you may not receive the best quality medical care that we feel you deserve.  You will note that the calendar groups together  your maintenance  medications that are timed at particular times of the day.  Think of this as your checklist for what your doctor has instructed you to do until your next evaluation to see what benefit  there is  to staying on a consistent group of medications intended to keep you well.  The other group at the bottom is entirely up to you to use as you see fit  for specific symptoms that may arise between visits that require you to treat them on an as needed basis.  Think of this as your action plan or "what if" list.   Separating the top medications from the bottom group is fundamental to providing you adequate care going forward.

## 2014-07-07 NOTE — Progress Notes (Signed)
Subjective:     Patient ID: Jeremiah [name], male   DOB: 01-08-1946   MRN: [id number]    Brief patient profile:  67 yobm quit smoking 1989 with no problems with variable sob x 2004 and maintained on inhalers referred 05/03/2013 by Dr Titus Mould for evaluation of outpt management for copd after hospitalization.  Admit date: 04/24/2013  Discharge date: 04/27/2013  Discharge Diagnoses:  Active Problems:  COPD exacerbation  Detailed Hospital Course:  68 yo male with abrupt onset of sob (1/10) that progressively worsened until EMS called. Patient intubated in ED due to severe respiratory distress. In route, patient had increasing tachypnea, decline SpO2, and obtundation. Patient with GCS <8 at arrival, evidence of emesis, intubated for severe respiratory distress. Family reported no evidence of fevers, chills, malaise, worsening coughing, and sob. Initial episode passed, however sister received a call later with him reporting a return of symptoms, complaints of inability of breathing and EMS alerted. Distant history of tobacco abuse. Has never been hospitalized for COPD before this episode. He has had no recent hospitalizations, and has never been previously intubated.  He was admitted to the ICU on 1/11 and was treated with the typical modalities for acute respiratory failure (for presumed AECODP) which included mechanical ventilation with sedation, inhaled bronchodilators, antibiotics, and systemic steroid therapy. He improved later that day and was able to be extubated and sedation was stopped. His care since that point has primarily focused on the de-escalation of the therapies mentioned above. On 1/13 he has returned to a near-baseline state of health and has been deemed a candidate for discharge.  Discharge Plan by diagnoses  Acute exacerbation of COPD  Acute hypercarbic respiratory failure (resolved)  plan  -Follow up with Dr. Melvyn Novas on 05/03/2013 at 2:30PM  -Change steroids to PO Prednisone, taper over  7-10d  -Resume home dose BDs    History of Present Illness  05/03/2013 1st [facility] office visit/ Myleen Brailsford cc more sob since Oct 2014 on symbicort 160 2 bid and spiriva daily  But still  daily use of proventil and no need for neb then needed neb x sev months prior to his admit above but back to baseline since d/c on pred still tapering off but using saba qid, not prn "out of habit" >>return for PFT   01/03/2014 Follow up  [name] .  Complains of increased episodes of increased DOE, wheezing, prod cough with white/clear mucus worse for last week. Family member says he has been progressively get worse over last year. Was admitted 04/2013 w/ COPD flare /VDRF . [name] from last office visit however did not follow up .   Followed at [facility] , says he was told he has Moderate COPD 6 yrs ago. On Symbicort and Spiriva.  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  Says he gets winded easily esp on incline.  Says worse for last week with increased cough and wheezing.  No hemotpysis, wt loss, calf pain, n/v/d, chest pain or fever.  Last abx ~2 months ago.  rec Doxycycline 100mg  Twice daily  For 7 days  Prednisone taper over next week Mucinex DM Twice daily  As needed  Cough /congestion  Continue on Symbicort and Spiriva .   01/24/2014 f/u ov/Nasiah Polinsky re:  GOLD III Chief Complaint  Patient presents with  . Follow-up    PFT done today. Pt states  that SOB and cough have improved some, but not back to normal baseline. Cough is prod with minimal clear sputum.   can do HT but no mall due to sob Prednisone really helped a lot but did not maintain improvement once finished >>pred taper add ppi bid and diet    02/21/2014 Follow up and Medication Review  Patient returns for follow-up and medication review. We reviewed all his medications organize them into a medication count with patient  education. It appears that he is take his medications correctly. Has begun pulmonary rehab, really likes it.  Patient has COPD flare last time was treated with prednisone. Feels that he did help. rec no change rx/ follow med calendar     03/29/2014 f/u ov/Sloan Galentine re: GOLD III/ no med calendar/ misunderstanding saba  Chief Complaint  Patient presents with  . Follow-up    Pt states went to ED 03/27/14 and dxed with acute COPD exacerbation. He states that he started noticing having increased SOB on 03/25/14.  He states that today his breathing is doing much better, and feels he is almost back to his normal baseline.    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 He denies any mass leaks or excessive daytime sleepiness Patient denies any hemoptysis, chest pain, orthopnea, PND or leg swelling Remains on Symbicort and Spiriva. He is on chronic steroids with a baseline prednisone at 10 mg Feels breathing is doing better, loves pulmonary rehab.  'Is determined to not end up on O2" rec Continue on Symbicort and Spiriva .  Follow med calendar closely and bring to each visit.     06/03/2014 f/u ov/Rydge Texidor re: GOLD III/ no med calendar / confused again with instructions maint vs prns Chief Complaint   Patient presents with  . Follow-up    Pt c/o cough in the am for the past 2 wks. Cough is non prod. His breathing has improved.    cough worse in am and typically used saba before symbicort rec Prednisone 10 mg take  4 each am x 2 days,   2 each am x 2 days,  1 each am x 2 days and stop  Plan A = Automatic = symbicort 160 Take 2 puffs first thing in am and then another 2 puffs about 12 hours later.                                      Spiriva one capsule each am  Work on Engineer, technical sales technique:    Plan B = Only use your albuterol (proventil)  as a rescue medication  Plan C = nebulizer = Albuterol ok to to use up to every 4 hours if needed but  call right away for appt     07/07/2014 f/u ov/Ileane Sando re: copd III/ still confused with meds though does have med calendar now  Chief Complaint  Patient presents with  . Follow-up    Pt states that his cough is unmchanged since his last visit. His breathing had been better- relates to pulmonary rehab, but worse for the past 2 days- relates to "pollen".        No obvious day to day or daytime variabilty or assoc excess or purulent sputum   cp or chest tightness, subjective wheeze overt sinus or hb symptoms. No unusual exp hx or h/o childhood pna/ asthma or knowledge of premature birth.  Sleeping ok without nocturnal  or early am exacerbation  of respiratory  c/o's or need for noct saba. Also denies any obvious fluctuation of symptoms with weather or environmental changes or other aggravating or alleviating factors except as outlined above   Current Medications, Allergies, Complete Past Medical History, Past Surgical History, Family History, and Social History were reviewed in [employer] record.  ROS  The following are not active complaints unless bolded sore throat, dysphagia, dental problems, itching, sneezing,  nasal congestion or excess/ purulent secretions, ear ache,   fever, chills, sweats, unintended wt loss,  pleuritic or exertional cp, hemoptysis,  orthopnea pnd or leg swelling, presyncope, palpitations, heartburn, abdominal pain, anorexia, nausea, vomiting, diarrhea  or change in bowel or urinary habits, change in stools or urine, dysuria,hematuria,  rash, arthralgias, visual complaints, headache, numbness weakness or ataxia or problems with walking or coordination,  change in mood/affect or memory.              .      Objective:       Physical Exam GEN: A/Ox3; pleasant , NAD, elderly , thin   07/07/2014        188 Wt Readings from Last 3 Encounters:  06/03/14 82.555 kg (182 lb)  04/26/14 81.829 kg (180 lb 6.4 oz)  03/29/14 83.008 kg (183 lb)    Vital signs reviewed       HEENT:  [place]/AT,  EACs-clear, TMs-wnl, NOSE-clear, THROAT-clear, no lesions, no postnasal drip or exudate noted.   NECK:  Supple w/ fair ROM; no JVD; normal carotid impulses w/o bruits; no thyromegaly or nodules palpated; no lymphadenopathy.  RESP  CTA bilaterally , bs are distant but no wheezes  no accessory muscle use, no dullness to percussion  CARD:  RRR, no m/r/g  , no peripheral edema, pulses intact, no cyanosis or clubbing.  GI:   Soft & nt; nml bowel sounds; no organomegaly or masses detected.  Musco: Warm bil, no deformities or joint swelling noted.   Neuro: alert, no focal deficits noted.    Skin: Warm, no lesions or rashes     03/27/14 1. Hyperinflation and bronchitic changes. 2. No focal acute pulmonary abnormality.       Assessment:

## 2014-07-09 ENCOUNTER — Encounter: Payer: Self-pay | Admitting: Internal Medicine

## 2014-07-09 NOTE — Assessment & Plan Note (Signed)
PFTs 01/24/2014  FEV1  1.30 (35%) ratio 49 with severe air trapping and no better p saba and dlco 48% -01/24/2014 p extensive coaching HFA effectiveness =    90%  - referred to rehab 01/24/14  -Med calendar 02/21/2014 > did not recognize it when reprinted 03/29/14 > reviewed  - 06/03/2014 p extensive coaching HFA effectiveness =    90%    I had an extended discussion with the patient reviewing all relevant studies completed to date and  lasting 15 to 20 minutes of a 25 minute visit on the following ongoing concerns:  1) Finally has med calendar but still not sure how to use it  2)  Each maintenance medication was reviewed in detail including most importantly the difference between maintenance and as needed and under what circumstances the prns are to be used. This was done in the context of a medication calendar review which provided the patient with a user-friendly unambiguous mechanism for medication administration and reconciliation and provides an action plan for all active problems. It is critical that this be shown to every doctor  for modification during the office visit if necessary so the patient can use it as a working document.      3) added contingency on med calendar if has to use neb saba as rescue more than rarely should go ahead and take short course of pred  4) See instructions for specific recommendations which were reviewed directly with the patient who was given a copy with highlighter outlining the key components.

## 2014-07-12 ENCOUNTER — Encounter (HOSPITAL_COMMUNITY): Payer: 59

## 2014-07-14 ENCOUNTER — Encounter (HOSPITAL_COMMUNITY)
Admission: RE | Admit: 2014-07-14 | Discharge: 2014-07-14 | Disposition: A | Discharge status: Home / Self Care | Payer: Self-pay | Source: Ambulatory Visit | Attending: Internal Medicine | Admitting: Internal Medicine

## 2014-07-19 ENCOUNTER — Encounter (HOSPITAL_COMMUNITY): Payer: 59

## 2014-07-19 DIAGNOSIS — J439 Emphysema, unspecified: Secondary | ICD-10-CM | POA: Insufficient documentation

## 2014-07-20 ENCOUNTER — Encounter: Payer: Self-pay | Admitting: Internal Medicine

## 2014-07-20 ENCOUNTER — Encounter (INDEPENDENT_AMBULATORY_CARE_PROVIDER_SITE_OTHER): Payer: Self-pay

## 2014-07-20 ENCOUNTER — Ambulatory Visit (INDEPENDENT_AMBULATORY_CARE_PROVIDER_SITE_OTHER): Payer: 59 | Admitting: Internal Medicine

## 2014-07-20 VITALS — BP 124/82 | HR 66 | Ht 72.0 in | Wt 182.0 lb

## 2014-07-20 DIAGNOSIS — J449 Chronic obstructive pulmonary disease, unspecified: Secondary | ICD-10-CM | POA: Diagnosis not present

## 2014-07-20 MED ORDER — FAMOTIDINE 20 MG PO TABS: 20.0000 mg | ORAL_TABLET | Freq: Every day | ORAL | Status: DC

## 2014-07-20 MED ORDER — ALPRAZOLAM 0.5 MG PO TABS: 0.5000 mg | ORAL_TABLET | Freq: Two times a day (BID) | ORAL | Status: DC | PRN

## 2014-07-20 NOTE — Progress Notes (Signed)
Subjective:     Patient ID: Jeremiah [name], male   DOB: 1946/02/17   MRN: [id number]    Brief patient profile:  67 yobm quit smoking 1989 with no problems with variable sob x 2004 and maintained on inhalers referred 05/03/2013 by Dr Titus Mould for evaluation of outpt management for copd after hospitalization and found to have GOLD III COPD 01/2014  with tendency to panic when sob.  Admit date: 04/24/2013  Discharge date: 04/27/2013  Discharge Diagnoses:  Active Problems:  COPD exacerbation  Detailed Hospital Course:  69 yo male with abrupt onset of sob (1/10) that progressively worsened until EMS called. Patient intubated in ED due to severe respiratory distress. In route, patient had increasing tachypnea, decline SpO2, and obtundation. Patient with GCS <8 at arrival, evidence of emesis, intubated for severe respiratory distress. Family reported no evidence of fevers, chills, malaise, worsening coughing, and sob. Initial episode passed, however sister received a call later with him reporting a return of symptoms, complaints of inability of breathing and EMS alerted. Distant history of tobacco abuse. Has never been hospitalized for COPD before this episode. He has had no recent hospitalizations, and has never been previously intubated.  He was admitted to the ICU on 1/11 and was treated with the typical modalities for acute respiratory failure (for presumed AECODP) which included mechanical ventilation with sedation, inhaled bronchodilators, antibiotics, and systemic steroid therapy. He improved later that day and was able to be extubated and sedation was stopped. His care since that point has primarily focused on the de-escalation of the therapies mentioned above. On 1/13 he has returned to a near-baseline state of health and has been deemed a candidate for discharge.  Discharge Plan by diagnoses  Acute exacerbation of COPD  Acute hypercarbic respiratory failure (resolved)  plan  -Follow up with Dr. Melvyn Novas  on 05/03/2013 at 2:30PM  -Change steroids to PO Prednisone, taper over 7-10d  -Resume home dose BDs    History of Present Illness  05/03/2013 1st [facility] office visit/ Jeremiah [name] cc more sob since Oct 2014 on symbicort 160 2 bid and spiriva daily  But still  daily use of proventil and no need for neb then needed neb x sev months prior to his admit above but back to baseline since d/c on pred still tapering off but using saba qid, not prn "out of habit" >>return for PFT   01/03/2014 Follow up  [name] .  Complains of increased episodes of increased DOE, wheezing, prod cough with white/clear mucus worse for last week. Family member says he has been progressively get worse over last year. Was admitted 04/2013 w/ COPD flare /VDRF . [name] from last office visit however did not follow up .   Followed at [facility] , says he was told he has Moderate COPD 6 yrs ago. On Symbicort and Spiriva.  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  Says he gets winded easily esp on incline.  Says worse for last week with increased cough and wheezing.  No hemotpysis, wt loss, calf pain, n/v/d, chest pain or fever.  Last abx ~2 months ago.  rec Doxycycline 100mg  Twice daily  For 7 days  Prednisone taper over next week Mucinex DM Twice daily  As needed  Cough /congestion  Continue on Symbicort and Spiriva .   01/24/2014 f/u ov/Jeremiah [name] re:  GOLD III Chief Complaint  Patient presents with  . Follow-up    PFT done today. Pt states that SOB and cough have improved some, but not back to normal baseline. Cough is prod with minimal clear sputum.   can do HT but no mall due to sob Prednisone really helped a lot but did not maintain improvement once finished >>pred taper add ppi bid and diet    02/21/2014 Follow up and Medication Review  Patient returns for follow-up and medication review. We reviewed all  his medications organize them into a medication count with patient education. It appears that he is take his medications correctly. Has begun pulmonary rehab, really likes it.  Patient has COPD flare last time was treated with prednisone. Feels that he did help. rec no change rx/ follow med calendar     03/29/2014 f/u ov/Jeremiah [name] re: GOLD III/ no med calendar/ misunderstanding saba  Chief Complaint  Patient presents with  . Follow-up    Pt states went to ED 03/27/14 and dxed with acute COPD exacerbation. He states that he started noticing having increased SOB on 03/25/14.  He states that today his breathing is doing much better, and feels he is almost back to his normal baseline.    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 He denies any mass leaks or excessive daytime sleepiness Patient denies any hemoptysis, chest pain, orthopnea, PND or leg swelling Remains on Symbicort and Spiriva. He is on chronic steroids with a baseline prednisone at 10 mg Feels breathing is doing better, loves pulmonary rehab.  'Is determined to not end up on O2" rec Continue on Symbicort and Spiriva .  Follow med calendar closely and bring to each visit.     06/03/2014 f/u ov/Jeremiah [name] re: GOLD III/ no med calendar /  confused again with instructions maint vs prns Chief Complaint  Patient presents with  . Follow-up    Pt c/o cough in the am for the past 2 wks. Cough is non prod. His breathing has improved.    cough worse in am and typically used saba before symbicort rec Prednisone 10 mg take  4 each am x 2 days,   2 each am x 2 days,  1 each am x 2 days and stop  Plan A = Automatic = symbicort 160 Take 2 puffs first thing in am and then another 2 puffs about 12 hours later.                                      Spiriva one capsule each am  Work on perfecting  inhaler technique:    Plan B = Only use your albuterol (proventil)  as a rescue medication  Plan C = nebulizer =  Albuterol ok to to use up to every 4 hours if needed but call right away for appt     07/07/2014 f/u ov/Jeremiah [name] re: copd III/ still confused with meds though does have med calendar now  Chief Complaint  Patient presents with  . Follow-up    Pt states that his cough is unmchanged since his last visit. His breathing had been better- relates to pulmonary rehab, but worse for the past 2 days- relates to "pollen".   rec If breathing worse and need the nebulizer more than rarely,go ahead and take Prednisone 10 mg take  4 each am x 2 days,   2 each am x 2 days,  1 each am x 2 days and stop     07/20/2014  Acute extended ov/Jeremiah [name] re: poorly controlled variable sob in setting of GOLD III copd Chief Complaint  Patient presents with  . Acute Visit    Pt c/o increased cough and SOB for the past 2 wks. He has also noticed wheezing. He states cough got was today- occ prod with clear sputum. He is using albuterol inhaler 3-4 times per day and neb a few times per wk.       actually doing pretty well with pulmonary rehab until the last few weeks, no desats reported  Waking up at night panicking due to sob better with or without saba if relaxes or uses xanax  Has med calendar but did not activate action plans   No obvious day to day or daytime  variabilty or assoc excess or purulent sputum   cp or chest tightness, subjective wheeze overt sinus or hb symptoms. No unusual exp hx or h/o childhood pna/ asthma or knowledge of premature birth.  Sleeping ok without nocturnal  or early am exacerbation  of respiratory  c/o's or need for noct saba. Also denies any obvious fluctuation of symptoms with weather or environmental changes or other aggravating or alleviating factors except as outlined above   Current Medications, Allergies, Complete Past Medical History, Past Surgical History, Family History, and Social History were reviewed in [employer] record.  ROS  The following are not active complaints unless bolded sore throat, dysphagia, dental problems, itching, sneezing,  nasal congestion or excess/ purulent secretions, ear ache,   fever, chills, sweats, unintended wt loss, pleuritic or exertional cp, hemoptysis,  orthopnea pnd or leg swelling, presyncope, palpitations, heartburn, abdominal pain, anorexia, nausea, vomiting, diarrhea  or change in bowel or urinary habits, change in stools or urine, dysuria,hematuria,  rash, arthralgias, visual complaints, headache, numbness weakness or ataxia or problems with walking or coordination,  change in mood/affect or memory.              .      Objective:       Physical Exam GEN: A/Ox3; pleasant , NAD, elderly , thin very anxious bm nad   07/07/2014        188  > 07/20/2014     182  Wt Readings from Last 3 Encounters:  06/03/14 82.555 kg (182 lb)  04/26/14 81.829 kg (180 lb 6.4 oz)  03/29/14 83.008 kg (183 lb)    Vital signs reviewed     HEENT:  [place]/AT,  EACs-clear, TMs-wnl, NOSE-clear, THROAT-clear, no lesions, no postnasal drip or exudate noted.  Top dentures   NECK:  Supple w/ fair ROM; no JVD; normal carotid impulses w/o bruits; no thyromegaly or nodules palpated; no lymphadenopathy.  RESP   bs are distant  With Trace  late exp  wheezes  no accessory muscle use,  no dullness to percussion  CARD:  RRR, no m/r/g  , no peripheral edema, pulses intact, no cyanosis or clubbing.  GI:   Soft & nt; nml bowel sounds; no organomegaly or masses detected.  Musco: Warm bil, no deformities or joint swelling noted.   Neuro: alert, no focal deficits noted.    Skin: Warm, no lesions or rashes     03/27/14 1. Hyperinflation and bronchitic changes. 2. No focal acute pulmonary abnormality.       Assessment:

## 2014-07-20 NOTE — Patient Instructions (Addendum)
Any time you have a flare of cough/ wheeze/ nighttime disturbance > Prednisone 10 mg take  4 each am x 2 days,   2 each am x 2 days,  1 each am x 2 days and stop   Instead of clariton try the zyrtec as per your med calender  Protonix should be 40 mg before bfast and Pepcid 20 mg one at bedtime as per calendar   GERD (REFLUX)  is an extremely common cause of respiratory symptoms just like yours , many times with no obvious heartburn at all.    It can be treated with medication, but also with lifestyle changes including elevation of head of bed, avoidance of late meals, excessive alcohol, smoking cessation, and avoid fatty foods, chocolate, peppermint, colas, red wine, and acidic juices such as orange juice.  NO MINT OR MENTHOL PRODUCTS SO NO COUGH DROPS  USE SUGARLESS CANDY INSTEAD (Jolley ranchers or Stover's or Life Savers) or even ice chips will also do - the key is to swallow to prevent all throat clearing. NO OIL BASED VITAMINS - use powdered substitutes.  See Tammy NP w/in 2 weeks with all your medications and your pillbox filled the Sunday before, even over the counter meds, separated in two separate bags, the ones you take no matter what vs the ones you stop once you feel better and take only as needed when you feel you need them.   Tammy  will generate for you a new user friendly medication calendar that will put [place] all on the same page re: your medication use.     Without this process, it simply isn't possible to assure that we are providing  your outpatient care  with  the attention to detail we feel you deserve.   If we cannot assure that you're getting that kind of care,  then we cannot manage your problem effectively from this clinic.  Once you have seen Tammy and we are sure that we're all on the same page with your medication use she will arrange follow up with me.

## 2014-07-21 ENCOUNTER — Encounter: Payer: Self-pay | Admitting: Internal Medicine

## 2014-07-21 ENCOUNTER — Encounter (HOSPITAL_COMMUNITY)
Admission: RE | Admit: 2014-07-21 | Discharge: 2014-07-21 | Disposition: A | Discharge status: Home / Self Care | Payer: Self-pay | Source: Ambulatory Visit | Attending: Internal Medicine | Admitting: Internal Medicine

## 2014-07-21 NOTE — Assessment & Plan Note (Addendum)
-   PFTs 01/24/2014  FEV1  1.30 (35%) ratio 49 with severe air trapping and no better p saba and dlco 48% -01/24/2014 p extensive coaching HFA effectiveness =    90%  - referred to rehab 01/24/14  -Med calendar 02/21/2014 > did not recognize it when reprinted 03/29/14 > reviewed  - 06/03/2014 p extensive coaching HFA effectiveness =    90%    I had an extended discussion with the patient reviewing all relevant studies completed to date and  lasting 25  minutes of a 40 minute visit on the following ongoing concerns:   1) he has severe copd but I really don't think there's much asthma here and he acts more like a classic pink puffer with predominant issues with air trapping when he panics  2) some of his noct spells could be related to reflux so rec elevate HOB and add pepcid 20 mg at hs   3) not using  med calendar as I hoped he would   4) Each maintenance medication was reviewed in detail including most importantly the difference between maintenance and as needed and under what circumstances the prns are to be used. This was done in the context of a medication calendar review / editing with red ink which provided the patient with a user-friendly unambiguous mechanism for medication administration and reconciliation and provides an action plan for all active problems. It is critical that this be shown to every doctor  for modification during the office visit if necessary so the patient can use it as a working document.     5) ok to use xanax prn but not as maint for now (clonazepam would be better choice for maint rx if it comes to this     rec Prednisone 10 mg take  4 each am x 2 days,   2 each am x 2 days,  1 each am x 2 days and stop and return in 2 weeks for med reconciliation/ continue pulmonary rehab

## 2014-07-26 ENCOUNTER — Encounter (HOSPITAL_COMMUNITY)
Admission: RE | Admit: 2014-07-26 | Discharge: 2014-07-26 | Disposition: A | Discharge status: Home / Self Care | Payer: Self-pay | Source: Ambulatory Visit | Attending: Internal Medicine | Admitting: Internal Medicine

## 2014-07-28 ENCOUNTER — Encounter (HOSPITAL_COMMUNITY)
Admission: RE | Admit: 2014-07-28 | Discharge: 2014-07-28 | Disposition: A | Discharge status: Home / Self Care | Payer: Self-pay | Source: Ambulatory Visit | Attending: Internal Medicine | Admitting: Internal Medicine

## 2014-08-02 ENCOUNTER — Encounter (HOSPITAL_COMMUNITY)
Admission: RE | Admit: 2014-08-02 | Discharge: 2014-08-02 | Disposition: A | Discharge status: Home / Self Care | Payer: Self-pay | Source: Ambulatory Visit | Attending: Internal Medicine | Admitting: Internal Medicine

## 2014-08-03 ENCOUNTER — Ambulatory Visit (INDEPENDENT_AMBULATORY_CARE_PROVIDER_SITE_OTHER): Payer: 59 | Admitting: Adult Health

## 2014-08-03 ENCOUNTER — Encounter: Payer: Self-pay | Admitting: Adult Health

## 2014-08-03 ENCOUNTER — Encounter (INDEPENDENT_AMBULATORY_CARE_PROVIDER_SITE_OTHER): Payer: Self-pay

## 2014-08-03 VITALS — BP 128/74 | HR 64 | Temp 97.5°F | Ht 72.0 in | Wt 181.6 lb

## 2014-08-03 DIAGNOSIS — G4733 Obstructive sleep apnea (adult) (pediatric): Secondary | ICD-10-CM | POA: Diagnosis not present

## 2014-08-03 DIAGNOSIS — J449 Chronic obstructive pulmonary disease, unspecified: Secondary | ICD-10-CM | POA: Diagnosis not present

## 2014-08-03 NOTE — Assessment & Plan Note (Signed)
Compensated on CPAP At bedtime

## 2014-08-03 NOTE — Progress Notes (Signed)
Subjective:     Patient ID: Jeremiah [name], male   DOB: 12/05/1945   MRN: [id number]    Brief patient profile:  21 yobm quit smoking 1989 with no problems with variable sob x 2004 and maintained on inhalers referred 05/03/2013 by Dr Titus Mould for evaluation of outpt management for copd after hospitalization and found to have GOLD III COPD 01/2014  with tendency to panic when sob.  Admit date: 04/24/2013  Discharge date: 04/27/2013  Discharge Diagnoses:  Active Problems:  COPD exacerbation  Detailed Hospital Course:  69 yo male with abrupt onset of sob (1/10) that progressively worsened until EMS called. Patient intubated in ED due to severe respiratory distress. In route, patient had increasing tachypnea, decline SpO2, and obtundation. Patient with GCS <8 at arrival, evidence of emesis, intubated for severe respiratory distress. Family reported no evidence of fevers, chills, malaise, worsening coughing, and sob. Initial episode passed, however sister received a call later with him reporting a return of symptoms, complaints of inability of breathing and EMS alerted. Distant history of tobacco abuse. Has never been hospitalized for COPD before this episode. He has had no recent hospitalizations, and has never been previously intubated.  He was admitted to the ICU on 1/11 and was treated with the typical modalities for acute respiratory failure (for presumed AECODP) which included mechanical ventilation with sedation, inhaled bronchodilators, antibiotics, and systemic steroid therapy. He improved later that day and was able to be extubated and sedation was stopped. His care since that point has primarily focused on the de-escalation of the therapies mentioned above. On 1/13 he has returned to a near-baseline state of health and has been deemed a candidate for discharge.  Discharge Plan by diagnoses  Acute exacerbation of COPD  Acute hypercarbic respiratory failure (resolved)  plan  -Follow up with Dr. Melvyn Novas  on 05/03/2013 at 2:30PM  -Change steroids to PO Prednisone, taper over 7-10d  -Resume home dose BDs    History of Present Illness  05/03/2013 1st [facility] [place] office visit/ Wert cc more sob since Oct 2014 on symbicort 160 2 bid and spiriva daily  But still  daily use of proventil and no need for neb then needed neb x sev months prior to his admit above but back to baseline since d/c on pred still tapering off but using saba qid, not prn "out of habit" >>return for PFT   01/03/2014 Follow up  [name] .  Complains of increased episodes of increased DOE, wheezing, prod cough with white/clear mucus worse for last week. Family member says he has been progressively get worse over last year. Was admitted 04/2013 w/ COPD flare /VDRF . [name] from last office visit however did not follow up .   Followed at [facility] , says he was told he has Moderate COPD 6 yrs ago. On Symbicort and Spiriva.  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  Says he gets winded easily esp on incline.  Says worse for last week with increased cough and wheezing.  No hemotpysis, wt loss, calf pain, n/v/d, chest pain or fever.  Last abx ~2 months ago.  rec Doxycycline 100mg  Twice daily  For 7 days  Prednisone taper over next week Mucinex DM Twice daily  As needed  Cough /congestion  Continue on Symbicort and Spiriva .   01/24/2014 f/u ov/Wert re:  GOLD III Chief Complaint  Patient presents with  . Follow-up    PFT done today. Pt states that SOB and cough have improved some, but not back to normal baseline. Cough is prod with minimal clear sputum.   can do HT but no mall due to sob Prednisone really helped a lot but did not maintain improvement once finished >>pred taper add ppi bid and diet    02/21/2014 Follow up and Medication Review  Patient returns for follow-up and medication review. We reviewed all  his medications organize them into a medication count with patient education. It appears that he is take his medications correctly. Has begun pulmonary rehab, really likes it.  Patient has COPD flare last time was treated with prednisone. Feels that he did help. rec no change rx/ follow med calendar     03/29/2014 f/u ov/Wert re: GOLD III/ no med calendar/ misunderstanding saba  Chief Complaint  Patient presents with  . Follow-up    Pt states went to ED 03/27/14 and dxed with acute COPD exacerbation. He states that he started noticing having increased SOB on 03/25/14.  He states that today his breathing is doing much better, and feels he is almost back to his normal baseline.    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 He denies any mass leaks or excessive daytime sleepiness Patient denies any hemoptysis, chest pain, orthopnea, PND or leg swelling Remains on Symbicort and Spiriva. He is on chronic steroids with a baseline prednisone at 10 mg Feels breathing is doing better, loves pulmonary rehab.  'Is determined to not end up on O2" rec Continue on Symbicort and Spiriva .  Follow med calendar closely and bring to each visit.     06/03/2014 f/u ov/Wert re: GOLD III/ no med calendar /  confused again with instructions maint vs prns Chief Complaint  Patient presents with  . Follow-up    Pt c/o cough in the am for the past 2 wks. Cough is non prod. His breathing has improved.    cough worse in am and typically used saba before symbicort rec Prednisone 10 mg take  4 each am x 2 days,   2 each am x 2 days,  1 each am x 2 days and stop  Plan A = Automatic = symbicort 160 Take 2 puffs first thing in am and then another 2 puffs about 12 hours later.                                      Spiriva one capsule each am  Work on perfecting  inhaler technique:    Plan B = Only use your albuterol (proventil)  as a rescue medication  Plan C = nebulizer =  Albuterol ok to to use up to every 4 hours if needed but call right away for appt     07/07/2014 f/u ov/Wert re: copd III/ still confused with meds though does have med calendar now  Chief Complaint  Patient presents with  . Follow-up    Pt states that his cough is unmchanged since his last visit. His breathing had been better- relates to pulmonary rehab, but worse for the past 2 days- relates to "pollen".   rec If breathing worse and need the nebulizer more than rarely,go ahead and take Prednisone 10 mg take  4 each am x 2 days,   2 each am x 2 days,  1 each am x 2 days and stop     07/20/2014  Acute extended ov/Wert re: poorly controlled variable sob in setting of GOLD III copd Chief Complaint  Patient presents with  . Acute Visit    Pt c/o increased cough and SOB for the past 2 wks. He has also noticed wheezing. He states cough got was today- occ prod with clear sputum. He is using albuterol inhaler 3-4 times per day and neb a few times per wk.       actually doing pretty well with pulmonary rehab until the last few weeks, no desats reported  Waking up at night panicking due to sob better with or without saba if relaxes or uses xanax  Has med calendar but did not activate action plans  >>pred taper   08/03/2014 Follow up and Med  Calendar : COPD III  Patient presents for a two-week follow-up and medication review We reviewed all his medications organize them into a medication calendar with patient education Appears he is taking his medications correctly. Last visit. Patient had a COPD flare. He was treated with a prednisone taper. Patient feels that he is much improved and back to his baseline. He is in the maintenance program at pulmonary rehabilitation and says he is doing very well. He denies any chest pain, orthopnea, PND, or increased leg swelling. Wears CPAP At bedtime  .   Current Medications, Allergies, Complete Past Medical History, Past Surgical History, Family History, and Social History were reviewed in [employer] record.  ROS  The following are not active complaints unless bolded sore throat, dysphagia, dental problems, itching, sneezing,  nasal congestion or excess/ purulent secretions, ear ache,   fever, chills, sweats, unintended wt loss, pleuritic or exertional cp, hemoptysis,  orthopnea pnd or leg swelling, presyncope, palpitations, heartburn, abdominal pain, anorexia, nausea, vomiting, diarrhea  or change in bowel or urinary habits, change in stools or urine, dysuria,hematuria,  rash, arthralgias, visual complaints, headache, numbness weakness or ataxia or problems with walking or coordination,  change in mood/affect or memory.              .      Objective:       Physical Exam GEN: A/Ox3; pleasant , NAD, elderly , thin very anxious bm nad   07/07/2014        188  > 07/20/2014     182 08/03/2014 >>181  Vital signs reviewed     HEENT:  [place]/AT,  EACs-clear, TMs-wnl, NOSE-clear, THROAT-clear, no lesions, no postnasal drip or exudate noted.  Top dentures   NECK:  Supple w/ fair ROM; no JVD; normal carotid impulses w/o bruits; no thyromegaly or nodules palpated; no lymphadenopathy.  RESP   bs are distant with no wheezing or crackles.  no accessory muscle use, no  dullness to percussion  CARD:  RRR, no m/r/g  , no peripheral edema, pulses intact, no cyanosis or clubbing.  GI:   Soft & nt; nml bowel sounds; no organomegaly or masses detected.  Musco: Warm bil, no deformities or joint swelling noted.   Neuro: alert, no focal deficits noted.    Skin: Warm, no lesions or rashes     03/27/14 1. Hyperinflation and bronchitic changes. 2. No focal acute pulmonary abnormality.       Assessment:

## 2014-08-03 NOTE — Assessment & Plan Note (Signed)
Compensated on current regimen Recent exacerbation, resolved with prednisone Patient's medications were reviewed today and patient education was given. Computerized medication calendar was adjusted/completed   Plan  Continue on Symbicort and Spiriva .  Follow med calendar closely and bring to each visit.  Follow up Dr. Melvyn Novas  In 3 months and As needed   Please contact office for sooner follow up if symptoms do not improve or worsen or seek emergency care

## 2014-08-03 NOTE — Patient Instructions (Signed)
Continue on Symbicort and Spiriva .  Follow med calendar closely and bring to each visit.  Follow up Dr. Melvyn Novas  In 3 months and As needed   Please contact office for sooner follow up if symptoms do not improve or worsen or seek emergency care

## 2014-08-04 ENCOUNTER — Encounter (HOSPITAL_COMMUNITY)
Admission: RE | Admit: 2014-08-04 | Discharge: 2014-08-04 | Disposition: A | Discharge status: Home / Self Care | Payer: Self-pay | Source: Ambulatory Visit | Attending: Internal Medicine | Admitting: Internal Medicine

## 2014-08-04 NOTE — Addendum Note (Signed)
Addended by: Parke Poisson E on: 08/04/2014 12:57 PM   Modules accepted: Orders, Medications

## 2014-08-09 ENCOUNTER — Encounter (HOSPITAL_COMMUNITY)
Admission: RE | Admit: 2014-08-09 | Discharge: 2014-08-09 | Disposition: A | Discharge status: Home / Self Care | Payer: Self-pay | Source: Ambulatory Visit | Attending: Internal Medicine | Admitting: Internal Medicine

## 2014-08-11 ENCOUNTER — Encounter (HOSPITAL_COMMUNITY)
Admission: RE | Admit: 2014-08-11 | Discharge: 2014-08-11 | Disposition: A | Discharge status: Home / Self Care | Payer: Self-pay | Source: Ambulatory Visit | Attending: Internal Medicine | Admitting: Internal Medicine

## 2014-08-16 ENCOUNTER — Encounter (HOSPITAL_COMMUNITY)
Admission: RE | Admit: 2014-08-16 | Discharge: 2014-08-16 | Disposition: A | Discharge status: Home / Self Care | Payer: Self-pay | Source: Ambulatory Visit | Attending: Internal Medicine | Admitting: Internal Medicine

## 2014-08-16 DIAGNOSIS — J439 Emphysema, unspecified: Secondary | ICD-10-CM | POA: Insufficient documentation

## 2014-08-18 ENCOUNTER — Encounter (HOSPITAL_COMMUNITY)
Admission: RE | Admit: 2014-08-18 | Discharge: 2014-08-18 | Disposition: A | Discharge status: Home / Self Care | Payer: Self-pay | Source: Ambulatory Visit | Attending: Internal Medicine | Admitting: Internal Medicine

## 2014-08-23 ENCOUNTER — Encounter (HOSPITAL_COMMUNITY)
Admission: RE | Admit: 2014-08-23 | Discharge: 2014-08-23 | Disposition: A | Discharge status: Home / Self Care | Payer: Self-pay | Source: Ambulatory Visit | Attending: Internal Medicine | Admitting: Internal Medicine

## 2014-08-25 ENCOUNTER — Encounter (HOSPITAL_COMMUNITY)
Admission: RE | Admit: 2014-08-25 | Discharge: 2014-08-25 | Disposition: A | Discharge status: Home / Self Care | Payer: Self-pay | Source: Ambulatory Visit | Attending: Internal Medicine | Admitting: Internal Medicine

## 2014-08-30 ENCOUNTER — Encounter (HOSPITAL_COMMUNITY): Payer: 59

## 2014-08-31 ENCOUNTER — Encounter: Payer: 59 | Admitting: Adult Health

## 2014-08-31 ENCOUNTER — Telehealth: Payer: Self-pay | Admitting: Internal Medicine

## 2014-08-31 NOTE — Telephone Encounter (Signed)
Open in error

## 2014-09-01 ENCOUNTER — Encounter (HOSPITAL_COMMUNITY)
Admission: RE | Admit: 2014-09-01 | Discharge: 2014-09-01 | Disposition: A | Discharge status: Home / Self Care | Payer: Self-pay | Source: Ambulatory Visit | Attending: Internal Medicine | Admitting: Internal Medicine

## 2014-09-06 ENCOUNTER — Encounter (HOSPITAL_COMMUNITY)
Admission: RE | Admit: 2014-09-06 | Discharge: 2014-09-06 | Disposition: A | Discharge status: Home / Self Care | Payer: Self-pay | Source: Ambulatory Visit | Attending: Internal Medicine | Admitting: Internal Medicine

## 2014-09-08 ENCOUNTER — Encounter (HOSPITAL_COMMUNITY)
Admission: RE | Admit: 2014-09-08 | Discharge: 2014-09-08 | Disposition: A | Discharge status: Home / Self Care | Payer: Self-pay | Source: Ambulatory Visit | Attending: Internal Medicine | Admitting: Internal Medicine

## 2014-09-13 ENCOUNTER — Encounter (HOSPITAL_COMMUNITY)
Admission: RE | Admit: 2014-09-13 | Discharge: 2014-09-13 | Disposition: A | Discharge status: Home / Self Care | Payer: Self-pay | Source: Ambulatory Visit | Attending: Internal Medicine | Admitting: Internal Medicine

## 2014-09-15 ENCOUNTER — Encounter (HOSPITAL_COMMUNITY)
Admission: RE | Admit: 2014-09-15 | Discharge: 2014-09-15 | Disposition: A | Discharge status: Home / Self Care | Payer: Self-pay | Source: Ambulatory Visit | Attending: Internal Medicine | Admitting: Internal Medicine

## 2014-09-15 DIAGNOSIS — J439 Emphysema, unspecified: Secondary | ICD-10-CM | POA: Insufficient documentation

## 2014-09-20 ENCOUNTER — Encounter (HOSPITAL_COMMUNITY)
Admission: RE | Admit: 2014-09-20 | Discharge: 2014-09-20 | Disposition: A | Discharge status: Home / Self Care | Payer: Self-pay | Source: Ambulatory Visit | Attending: Internal Medicine | Admitting: Internal Medicine

## 2014-09-22 ENCOUNTER — Encounter (HOSPITAL_COMMUNITY)
Admission: RE | Admit: 2014-09-22 | Discharge: 2014-09-22 | Disposition: A | Discharge status: Home / Self Care | Payer: Self-pay | Source: Ambulatory Visit | Attending: Internal Medicine | Admitting: Internal Medicine

## 2014-09-27 ENCOUNTER — Encounter (HOSPITAL_COMMUNITY)
Admission: RE | Admit: 2014-09-27 | Discharge: 2014-09-27 | Disposition: A | Discharge status: Home / Self Care | Payer: Self-pay | Source: Ambulatory Visit | Attending: Internal Medicine | Admitting: Internal Medicine

## 2014-09-29 ENCOUNTER — Encounter (HOSPITAL_COMMUNITY)
Admission: RE | Admit: 2014-09-29 | Discharge: 2014-09-29 | Disposition: A | Discharge status: Home / Self Care | Payer: Self-pay | Source: Ambulatory Visit | Attending: Internal Medicine | Admitting: Internal Medicine

## 2014-10-04 ENCOUNTER — Encounter (HOSPITAL_COMMUNITY)
Admission: RE | Admit: 2014-10-04 | Discharge: 2014-10-04 | Disposition: A | Discharge status: Home / Self Care | Payer: Self-pay | Source: Ambulatory Visit | Attending: Internal Medicine | Admitting: Internal Medicine

## 2014-10-06 ENCOUNTER — Encounter (HOSPITAL_COMMUNITY): Payer: 59

## 2014-10-11 ENCOUNTER — Encounter (HOSPITAL_COMMUNITY)
Admission: RE | Admit: 2014-10-11 | Discharge: 2014-10-11 | Disposition: A | Discharge status: Home / Self Care | Payer: Self-pay | Source: Ambulatory Visit | Attending: Internal Medicine | Admitting: Internal Medicine

## 2014-10-13 ENCOUNTER — Encounter (HOSPITAL_COMMUNITY)
Admission: RE | Admit: 2014-10-13 | Discharge: 2014-10-13 | Disposition: A | Discharge status: Home / Self Care | Payer: Self-pay | Source: Ambulatory Visit | Attending: Internal Medicine | Admitting: Internal Medicine

## 2014-10-18 ENCOUNTER — Encounter (HOSPITAL_COMMUNITY)
Admission: RE | Admit: 2014-10-18 | Discharge: 2014-10-18 | Disposition: A | Discharge status: Home / Self Care | Payer: Self-pay | Source: Ambulatory Visit | Attending: Internal Medicine | Admitting: Internal Medicine

## 2014-10-18 DIAGNOSIS — J439 Emphysema, unspecified: Secondary | ICD-10-CM | POA: Insufficient documentation

## 2014-10-20 ENCOUNTER — Encounter (HOSPITAL_COMMUNITY)
Admission: RE | Admit: 2014-10-20 | Discharge: 2014-10-20 | Disposition: A | Discharge status: Home / Self Care | Payer: Self-pay | Source: Ambulatory Visit | Attending: Internal Medicine | Admitting: Internal Medicine

## 2014-10-24 ENCOUNTER — Other Ambulatory Visit: Payer: Self-pay | Admitting: Internal Medicine

## 2014-10-24 NOTE — Telephone Encounter (Signed)
Ok to refill 

## 2014-10-24 NOTE — Telephone Encounter (Signed)
Are you okay with refilling this? He has ov pending 7/20

## 2014-10-25 ENCOUNTER — Encounter (HOSPITAL_COMMUNITY)
Admission: RE | Admit: 2014-10-25 | Discharge: 2014-10-25 | Disposition: A | Discharge status: Home / Self Care | Payer: Self-pay | Source: Ambulatory Visit | Attending: Internal Medicine | Admitting: Internal Medicine

## 2014-10-26 DIAGNOSIS — F4323 Adjustment disorder with mixed anxiety and depressed mood: Secondary | ICD-10-CM | POA: Diagnosis not present

## 2014-10-27 ENCOUNTER — Encounter (HOSPITAL_COMMUNITY)
Admission: RE | Admit: 2014-10-27 | Discharge: 2014-10-27 | Disposition: A | Discharge status: Home / Self Care | Payer: Self-pay | Source: Ambulatory Visit | Attending: Internal Medicine | Admitting: Internal Medicine

## 2014-11-01 ENCOUNTER — Encounter: Payer: Self-pay | Admitting: Internal Medicine

## 2014-11-01 ENCOUNTER — Encounter (HOSPITAL_COMMUNITY): Payer: 59

## 2014-11-01 ENCOUNTER — Ambulatory Visit (INDEPENDENT_AMBULATORY_CARE_PROVIDER_SITE_OTHER): Payer: Medicare Other | Admitting: Internal Medicine

## 2014-11-01 VITALS — BP 118/80 | HR 78 | Ht 73.5 in | Wt 180.6 lb

## 2014-11-01 DIAGNOSIS — J449 Chronic obstructive pulmonary disease, unspecified: Secondary | ICD-10-CM

## 2014-11-01 DIAGNOSIS — F411 Generalized anxiety disorder: Secondary | ICD-10-CM

## 2014-11-01 MED ORDER — PREDNISONE 10 MG PO TABS: ORAL_TABLET | ORAL | Status: DC

## 2014-11-01 NOTE — Progress Notes (Signed)
Subjective:     Patient ID: Jeremiah [name], male   DOB: 11-27-1945   MRN: [id number]    Brief patient profile:  31 yobm quit smoking 1989 with no problems with variable sob x 2004 and maintained on inhalers referred 05/03/2013 by Jeremiah [name] for evaluation of outpt management for copd after hospitalization and found to have GOLD III COPD 01/2014  with tendency to panic when sob.  Admit date: 04/24/2013  Discharge date: 04/27/2013  Discharge Diagnoses:  Active Problems:  COPD exacerbation  Detailed Hospital Course:  69 yo male with abrupt onset of sob (1/10) that progressively worsened until EMS called. Patient intubated in ED due to severe respiratory distress. In route, patient had increasing tachypnea, decline SpO2, and obtundation. Patient with GCS <8 at arrival, evidence of emesis, intubated for severe respiratory distress. Family reported no evidence of fevers, chills, malaise, worsening coughing, and sob. Initial episode passed, however sister received a call later with him reporting a return of symptoms, complaints of inability of breathing and EMS alerted. Distant history of tobacco abuse. Has never been hospitalized for COPD before this episode. He has had no recent hospitalizations, and has never been previously intubated.  He was admitted to the ICU on 1/11 and was treated with the typical modalities for acute respiratory failure (for presumed AECODP) which included mechanical ventilation with sedation, inhaled bronchodilators, antibiotics, and systemic steroid therapy. He improved later that day and was able to be extubated and sedation was stopped. His care since that point has primarily focused on the de-escalation of the therapies mentioned above. On 1/13 he has returned to a near-baseline state of health and has been deemed a candidate for discharge.  Discharge Plan by diagnoses  Acute exacerbation of COPD  Acute hypercarbic respiratory failure (resolved)  plan  -Follow up with Jeremiah [name]  on 05/03/2013 at 2:30PM  -Change steroids to PO Prednisone, taper over 7-10d  -Resume home dose BDs    History of Present Illness  05/03/2013 1st [facility] office visit/ Jeremiah [name] cc more sob since Oct 2014 on symbicort 160 2 bid and spiriva daily  But still  daily use of proventil and no need for neb then needed neb x sev months prior to his admit above but back to baseline since d/c on pred still tapering off but using saba qid, not prn "out of habit" >>return for PFT   01/03/2014 Follow up  [name] .  Complains of increased episodes of increased DOE, wheezing, prod cough with white/clear mucus worse for last week. Family member says he has been progressively get worse over last year. Was admitted 04/2013 w/ COPD flare /VDRF . [name] from last office visit however did not follow up .   Followed at [facility] , says he was told he has Moderate COPD 6 yrs ago. On Symbicort and Spiriva.  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  Says he gets winded easily esp on incline.  Says worse for last week with increased cough and wheezing.  No hemotpysis, wt loss, calf pain, n/v/d, chest pain or fever.  Last abx ~2 months ago.  rec Doxycycline 100mg  Twice daily  For 7 days  Prednisone taper over next week Mucinex DM Twice daily  As needed  Cough /congestion  Continue on Symbicort and Spiriva .   01/24/2014 f/u ov/Jeremiah [name] re:  GOLD III Chief Complaint  Patient presents with  . Follow-up    PFT done today. Pt states that SOB and cough have improved some, but not back to normal baseline. Cough is prod with minimal clear sputum.   can do HT but no mall due to sob Prednisone really helped a lot but did not maintain improvement once finished >>pred taper add ppi bid and diet    02/21/2014 Follow up and Medication Review  Patient returns for follow-up and medication review. We reviewed all  his medications organize them into a medication count with patient education. It appears that he is take his medications correctly. Has begun pulmonary rehab, really likes it.  Patient has COPD flare last time was treated with prednisone. Feels that he did help. rec no change rx/ follow med calendar     03/29/2014 f/u ov/Jeremiah [name] re: GOLD III/ no med calendar/ misunderstanding saba  Chief Complaint  Patient presents with  . Follow-up    Pt states went to ED 03/27/14 and dxed with acute COPD exacerbation. He states that he started noticing having increased SOB on 03/25/14.  He states that today his breathing is doing much better, and feels he is almost back to his normal baseline.    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 He denies any mass leaks or excessive daytime sleepiness Patient denies any hemoptysis, chest pain, orthopnea, PND or leg swelling Remains on Symbicort and Spiriva. He is on chronic steroids with a baseline prednisone at 10 mg Feels breathing is doing better, loves pulmonary rehab.  'Is determined to not end up on O2" rec Continue on Symbicort and Spiriva .  Follow med calendar closely and bring to each visit.     06/03/2014 f/u ov/Jeremiah [name] re: GOLD III/ no med calendar /  confused again with instructions maint vs prns Chief Complaint  Patient presents with  . Follow-up    Pt c/o cough in the am for the past 2 wks. Cough is non prod. His breathing has improved.    cough worse in am and typically used saba before symbicort rec Prednisone 10 mg take  4 each am x 2 days,   2 each am x 2 days,  1 each am x 2 days and stop  Plan A = Automatic = symbicort 160 Take 2 puffs first thing in am and then another 2 puffs about 12 hours later.                                      Spiriva one capsule each am  Work on perfecting  inhaler technique:    Plan B = Only use your albuterol (proventil)  as a rescue medication  Plan C = nebulizer =  Albuterol ok to to use up to every 4 hours if needed but call right away for appt     07/07/2014 f/u ov/Jeremiah [name] re: copd III/ still confused with meds though does have med calendar now  Chief Complaint  Patient presents with  . Follow-up    Pt states that his cough is unmchanged since his last visit. His breathing had been better- relates to pulmonary rehab, but worse for the past 2 days- relates to "pollen".   rec If breathing worse and need the nebulizer more than rarely,go ahead and take Prednisone 10 mg take  4 each am x 2 days,   2 each am x 2 days,  1 each am x 2 days and stop     07/20/2014  Acute extended ov/Jeremiah [name] re: poorly controlled variable sob in setting of GOLD III copd Chief Complaint  Patient presents with  . Acute Visit    Pt c/o increased cough and SOB for the past 2 wks. He has also noticed wheezing. He states cough got was today- occ prod with clear sputum. He is using albuterol inhaler 3-4 times per day and neb a few times per wk.      actually doing pretty well with pulmonary rehab until the last few weeks, no desats reported  Waking up at night panicking due to sob better with or without saba if relaxes or uses xanax  Has med calendar but did not activate action plans  rec Any time you have a flare of cough/  wheeze/ nighttime disturbance > Prednisone 10 mg take  4 each am x 2 days,   2 each am x 2 days,  1 each am x 2 days and stop  Instead of clariton try the zyrtec as per your med calender Protonix should be 40 mg before bfast and Pepcid 20 mg one at bedtime as per calendar  GERD diet    08/03/2014 NP Follow up and Med Calendar : COPD III  Patient presents for a two-week follow-up and medication review We reviewed all his medications organize them into a medication calendar with patient education Appears he is taking his medications correctly. Last visit. Patient had a COPD flare. He was treated with a prednisone taper. Patient feels that he is much improved and back to his baseline. He is in the maintenance program at pulmonary rehabilitation and says he is doing very well.  Wears CPAP At bedtime rec Continue on Symbicort and Spiriva .  Follow med calendar closely and bring to each visit.  Follow up Jeremiah [name]  In 3 months and As needed     11/01/2014 f/u ov/Jeremiah [name] re: Jake Seats III copd  Chief Complaint  Patient presents with  . Follow-up    COPD: breathing has been doing well.  Still waking up at night with dry, hacking cough. Needs refills on Xanax, Prednisone.    wakes up maybe once a week with dry cough, not using Plan B or C or prednisone from action plan and overusing xanax which he is now placing in pill box Still not able to translate the med calendar format into his routine Not limited by breathing from desired activities  But not very active   No obvious day to day or daytime variability or assoc excess or purulent sputum  or cp or chest tightness, subjective wheeze or overt sinus or hb symptoms. No unusual exp hx or h/o childhood pna/ asthma or knowledge of premature birth.  Sleeping ok without nocturnal  or early  am exacerbation  of respiratory  c/o's or need for noct saba. Also denies any obvious fluctuation of symptoms with weather or environmental changes or other aggravating or  alleviating factors except as outlined above   Current Medications, Allergies, Complete Past Medical History, Past Surgical History, Family History, and Social History were reviewed in [employer] record.  ROS  The following are not active complaints unless bolded sore throat, dysphagia, dental problems, itching, sneezing,  nasal congestion or excess/ purulent secretions, ear ache,   fever, chills, sweats, unintended wt loss, classically pleuritic or exertional cp, hemoptysis,  orthopnea pnd or leg swelling, presyncope, palpitations, abdominal pain, anorexia, nausea, vomiting, diarrhea  or change in bowel or bladder habits, change in stools or urine, dysuria,hematuria,  rash, arthralgias, visual complaints, headache, numbness, weakness or ataxia or problems with walking or coordination,  change in mood/affect or memory.           Objective:       Physical Exam GEN: A/Ox3; pleasant , NAD, elderly , thin very anxious bm nad   07/07/2014        188  > 07/20/2014     182 08/03/2014 >>    11/01/2014  181   Vital signs reviewed     HEENT:  [place]/AT,  EACs-clear, TMs-wnl, NOSE-clear, THROAT-clear, no lesions, no postnasal drip or exudate noted.  Top dentures   NECK:  Supple w/ fair ROM; no JVD; normal carotid impulses w/o bruits; no thyromegaly or nodules palpated; no lymphadenopathy.  RESP   bs are distant with no wheezing or crackles.  no accessory muscle use, no dullness to percussion  CARD:  RRR, no m/r/g  , no peripheral edema, pulses intact, no cyanosis or clubbing.  GI:   Soft & nt; nml bowel sounds; no organomegaly or masses detected.  Musco: Warm bil, no deformities or joint swelling noted.   Neuro: alert, no focal deficits noted.    Skin: Warm, no lesions or rashes     I personally reviewed images and agree with radiology impression as follows:  CXR:   03/27/14 1. Hyperinflation and bronchitic changes. 2. No focal acute pulmonary abnormality.        Assessment:     Outpatient Encounter Prescriptions as of 11/01/2014  Medication Sig  . albuterol (PROVENTIL HFA;VENTOLIN HFA) 108 (90 BASE) MCG/ACT inhaler Inhale 2 puffs into the lungs every 4 (four) hours as needed for wheezing or shortness of breath (((PLAN B))).  [name] Kitchen albuterol (PROVENTIL) (2.5 MG/3ML) 0.083% nebulizer solution Take 3 mLs by nebulization every 4 (four) hours as needed for wheezing or shortness of breath (((PLAN C))).   . Alpha-D-Galactosidase (BEANO PO) Take by mouth.  . ALPRAZolam (XANAX) 0.5 MG tablet TAKE 1 TABLET BY MOUTH 2 TIMES DAILY AS NEEDED FOR ANXIETY.  . budesonide-formoterol (SYMBICORT) 160-4.5 MCG/ACT inhaler Inhale 2 puffs into the lungs 2 (two) times daily.  . cetirizine (ZYRTEC) 10 MG tablet Take 10 mg by mouth at bedtime as needed for allergies.  [name] Kitchen dextromethorphan-guaiFENesin (MUCINEX DM) 30-600 MG per 12 hr tablet Take 1 tablet by mouth 2 (two) times daily as needed for cough.  [name] Kitchen FLUoxetine (PROZAC) 20 MG capsule Take 1 capsule (20 mg total) by mouth daily.  . fluticasone (FLONASE) 50 MCG/ACT nasal spray Place 2 sprays into both nostrils 2 (two) times daily.  . hydrochlorothiazide (HYDRODIURIL) 25 MG tablet Take 25 mg by mouth daily.  . magnesium oxide (MAG-OX) 400 MG tablet Take 400 mg by mouth 2 (two) times  daily. For leg cramps  . Multiple Vitamin (CALCIUM COMPLEX PO) Take 3 tablets by mouth daily.  . Multiple Vitamin (MULTIVITAMIN WITH MINERALS) TABS tablet Take 1 tablet by mouth daily.  . naproxen (NAPROSYN) 500 MG tablet Per bottle as needed for joint pain  . nystatin (MYCOSTATIN) 100000 UNIT/ML suspension Take 5 mLs by mouth 4 (four) times daily.  [name] Kitchen OVER THE COUNTER MEDICATION PERIODGARD: swish/spit 1 tsp by mouth once daily  . pantoprazole (PROTONIX) 40 MG tablet 2 tabs by mouth 3mins prior to the first and last meals of the day  . Tea Tree Oil OIL by Does not apply route.  . tiotropium (SPIRIVA) 18 MCG inhalation capsule Place 18 mcg into inhaler  and inhale daily.  . [DISCONTINUED] predniSONE (DELTASONE) 10 MG tablet Taper as directed  . predniSONE (DELTASONE) 10 MG tablet Take  4 each am x 2 days,   2 each am x 2 days,  1 each am x 2 days and stop  . [DISCONTINUED] famotidine (PEPCID) 20 MG tablet Take 1 tablet (20 mg total) by mouth at bedtime. (Patient not taking: Reported on 11/01/2014)   No facility-administered encounter medications on file as of 11/01/2014.

## 2014-11-01 NOTE — Patient Instructions (Signed)
See calendar for specific medication instructions and bring it back for each and every office visit for every healthcare provider you see.  Without it,  you may not receive the best quality medical care that we feel you deserve.  You will note that the calendar groups together  your maintenance  medications that are timed at particular times of the day.  Think of this as your checklist for what your doctor has instructed you to do until your next evaluation to see what benefit  there is  to staying on a consistent group of medications intended to keep you well.  The other group at the bottom is entirely up to you to use as you see fit  for specific symptoms that may arise between visits that require you to treat them on an as needed basis.  Think of this as your action plan or "what if" list.   Separating the top medications from the bottom group is fundamental to providing you adequate care going forward.    Please schedule a follow up office visit in 6 weeks, call sooner if needed

## 2014-11-02 ENCOUNTER — Encounter: Payer: Self-pay | Admitting: Internal Medicine

## 2014-11-02 DIAGNOSIS — F411 Generalized anxiety disorder: Secondary | ICD-10-CM | POA: Insufficient documentation

## 2014-11-02 NOTE — Assessment & Plan Note (Signed)
Reviewed approp use of xanax up to 0.5 bid prn ,  Not as a maint rx, and if he can't cut back on it now may need psych referral but defer this issue to primary care

## 2014-11-02 NOTE — Assessment & Plan Note (Signed)
-   PFTs 01/24/2014  FEV1  1.30 (35%) ratio 49 with severe air trapping and no better p saba and dlco 48% -01/24/2014 p extensive coaching HFA effectiveness =    90%  - referred to rehab 01/24/14  -Med calendar 02/21/2014 > did not recognize it when reprinted 03/29/14 > reviewed , 08/03/2014  - 06/03/2014 p extensive coaching HFA effectiveness =    90%    He has severe copd and still very little insight into how to manage it and keep up with the action plan he's been provided  I had an extended discussion with the patient reviewing all relevant studies completed to date and  lasting 15 to 20 minutes of a 25 minute visit    Each maintenance medication was reviewed in detail including most importantly the difference between maintenance and prns and under what circumstances the prns are to be triggered using an action plan format that is not reflected in the computer generated alphabetically organized AVS.  His med calendar was revised /updated/reviewed/emphasized   Please see instructions for details which were reviewed in writing and the patient given a copy highlighting the part that I personally wrote and discussed at today's ov.

## 2014-11-03 ENCOUNTER — Encounter (HOSPITAL_COMMUNITY)
Admission: RE | Admit: 2014-11-03 | Discharge: 2014-11-03 | Disposition: A | Discharge status: Home / Self Care | Payer: Self-pay | Source: Ambulatory Visit | Attending: Internal Medicine | Admitting: Internal Medicine

## 2014-11-04 ENCOUNTER — Other Ambulatory Visit: Payer: Self-pay | Admitting: Internal Medicine

## 2014-11-07 DIAGNOSIS — F330 Major depressive disorder, recurrent, mild: Secondary | ICD-10-CM | POA: Diagnosis not present

## 2014-11-08 ENCOUNTER — Encounter (HOSPITAL_COMMUNITY)
Admission: RE | Admit: 2014-11-08 | Discharge: 2014-11-08 | Disposition: A | Discharge status: Home / Self Care | Payer: Self-pay | Source: Ambulatory Visit | Attending: Internal Medicine | Admitting: Internal Medicine

## 2014-11-17 ENCOUNTER — Encounter (HOSPITAL_COMMUNITY): Payer: Self-pay

## 2014-11-17 DIAGNOSIS — J439 Emphysema, unspecified: Secondary | ICD-10-CM | POA: Insufficient documentation

## 2014-11-22 ENCOUNTER — Encounter (HOSPITAL_COMMUNITY)
Admission: RE | Admit: 2014-11-22 | Discharge: 2014-11-22 | Disposition: A | Discharge status: Home / Self Care | Payer: Self-pay | Source: Ambulatory Visit | Attending: Internal Medicine | Admitting: Internal Medicine

## 2014-11-24 ENCOUNTER — Encounter (HOSPITAL_COMMUNITY): Payer: Self-pay

## 2014-11-29 ENCOUNTER — Encounter (HOSPITAL_COMMUNITY): Payer: Self-pay

## 2014-12-01 ENCOUNTER — Encounter (HOSPITAL_COMMUNITY): Payer: Self-pay

## 2014-12-03 DIAGNOSIS — M159 Polyosteoarthritis, unspecified: Secondary | ICD-10-CM | POA: Diagnosis not present

## 2014-12-03 DIAGNOSIS — N410 Acute prostatitis: Secondary | ICD-10-CM | POA: Diagnosis not present

## 2014-12-03 DIAGNOSIS — I10 Essential (primary) hypertension: Secondary | ICD-10-CM | POA: Diagnosis not present

## 2014-12-03 DIAGNOSIS — R309 Painful micturition, unspecified: Secondary | ICD-10-CM | POA: Diagnosis not present

## 2014-12-06 ENCOUNTER — Encounter (HOSPITAL_COMMUNITY)
Admission: RE | Admit: 2014-12-06 | Discharge: 2014-12-06 | Disposition: A | Discharge status: Home / Self Care | Payer: Self-pay | Source: Ambulatory Visit | Attending: Internal Medicine | Admitting: Internal Medicine

## 2014-12-06 DIAGNOSIS — F330 Major depressive disorder, recurrent, mild: Secondary | ICD-10-CM | POA: Diagnosis not present

## 2014-12-08 ENCOUNTER — Encounter (HOSPITAL_COMMUNITY)
Admission: RE | Admit: 2014-12-08 | Discharge: 2014-12-08 | Disposition: A | Discharge status: Home / Self Care | Payer: Self-pay | Source: Ambulatory Visit | Attending: Internal Medicine | Admitting: Internal Medicine

## 2014-12-12 ENCOUNTER — Encounter: Payer: Self-pay | Admitting: Internal Medicine

## 2014-12-12 ENCOUNTER — Ambulatory Visit (INDEPENDENT_AMBULATORY_CARE_PROVIDER_SITE_OTHER): Payer: Medicare Other | Admitting: Internal Medicine

## 2014-12-12 ENCOUNTER — Ambulatory Visit (INDEPENDENT_AMBULATORY_CARE_PROVIDER_SITE_OTHER)
Admission: RE | Admit: 2014-12-12 | Discharge: 2014-12-12 | Disposition: A | Discharge status: Home / Self Care | Payer: Medicare Other | Source: Ambulatory Visit | Attending: Internal Medicine | Admitting: Internal Medicine

## 2014-12-12 VITALS — BP 112/80 | HR 67 | Ht 73.0 in | Wt 177.4 lb

## 2014-12-12 DIAGNOSIS — J449 Chronic obstructive pulmonary disease, unspecified: Secondary | ICD-10-CM

## 2014-12-12 DIAGNOSIS — R05 Cough: Secondary | ICD-10-CM | POA: Diagnosis not present

## 2014-12-12 DIAGNOSIS — I10 Essential (primary) hypertension: Secondary | ICD-10-CM

## 2014-12-12 MED ORDER — ACLIDINIUM BROMIDE 400 MCG/ACT IN AEPB: 1.0000 | INHALATION_SPRAY | Freq: Two times a day (BID) | RESPIRATORY_TRACT | Status: DC

## 2014-12-12 NOTE — Patient Instructions (Addendum)
Stop spiriva and if possible start tudorza one twice daily and many of your gi and bladder problems may be solved  See Tammy NP w/in 2 weeks(or first available) with all your medications, even over the counter meds, separated in two separate bags, the ones you take no matter what vs the ones you stop once you feel better and take only as needed when you feel you need them.   Tammy  will generate for you a new user friendly medication calendar that will put [place] all on the same page re: your medication use.     Without this process, it simply isn't possible to assure that we are providing  your outpatient care  with  the attention to detail we feel you deserve.   If we cannot assure that you're getting that kind of care,  then we cannot manage your problem effectively from this clinic.  Once you have seen Tammy and we are sure that we're all on the same page with your medication use she will arrange follow up with me.  Late add refer to int med for hbp

## 2014-12-12 NOTE — Progress Notes (Signed)
Subjective:     Patient ID: Jeremiah [name], male   DOB: [birth year]   MRN: [id number]    Brief patient profile:  69 yobm quit smoking 1989 with no problems with variable sob x 2004 and maintained on inhalers referred 05/03/2013 by Dr Titus Mould for evaluation of outpt management for copd after hospitalization and found to have GOLD III COPD 01/2014  with tendency to panic when sob.  Admit date: 04/24/2013  Discharge date: 04/27/2013  Discharge Diagnoses:  Active Problems:  COPD exacerbation  Detailed Hospital Course:  69 yo male with abrupt onset of sob (1/10) that progressively worsened until EMS called. Patient intubated in ED due to severe respiratory distress. In route, patient had increasing tachypnea, decline SpO2, and obtundation. Patient with GCS <8 at arrival, evidence of emesis, intubated for severe respiratory distress. Family reported no evidence of fevers, chills, malaise, worsening coughing, and sob. Initial episode passed, however sister received a call later with him reporting a return of symptoms, complaints of inability of breathing and EMS alerted. Distant history of tobacco abuse. Has never been hospitalized for COPD before this episode. He has had no recent hospitalizations, and has never been previously intubated.  He was admitted to the ICU on 1/11 and was treated with the typical modalities for acute respiratory failure (for presumed AECODP) which included mechanical ventilation with sedation, inhaled bronchodilators, antibiotics, and systemic steroid therapy. He improved later that day and was able to be extubated and sedation was stopped. His care since that point has primarily focused on the de-escalation of the therapies mentioned above. On 1/13 he has returned to a near-baseline state of health and has been deemed a candidate for discharge.  Discharge Plan by diagnoses  Acute exacerbation of COPD  Acute hypercarbic respiratory failure (resolved)  plan  -Follow up with Dr. Melvyn Novas  on 05/03/2013 at 2:30PM  -Change steroids to PO Prednisone, taper over 7-10d  -Resume home dose BDs    History of Present Illness  05/03/2013 1st [facility] office visit/ Jeremiah [name] cc more sob since Oct 2014 on symbicort 160 2 bid and spiriva daily  But still  daily use of proventil and no need for neb then needed neb x sev months prior to his admit above but back to baseline since d/c on pred still tapering off but using saba qid, not prn "out of habit" >>return for PFT   01/03/2014 Follow up  [name] .  Complains of increased episodes of increased DOE, wheezing, prod cough with white/clear mucus worse for last week. Family member says he has been progressively get worse over last year. Was admitted 04/2013 w/ COPD flare /VDRF . [name] from last office visit however did not follow up .   Followed at [facility] , says he was told he has Moderate COPD 6 yrs ago. On Symbicort and Spiriva.  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  Says he gets winded easily esp on incline.  Says worse for last week with increased cough and wheezing.  No hemotpysis, wt loss, calf pain, n/v/d, chest pain or fever.  Last abx ~2 months ago.  rec Doxycycline 100mg  Twice daily  For 7 days  Prednisone taper over next week Mucinex DM Twice daily  As needed  Cough /congestion  Continue on Symbicort and Spiriva .   01/24/2014 f/u ov/Jeremiah [name] re:  GOLD III Chief Complaint  Patient presents with  . Follow-up    PFT done today. Pt states that SOB and cough have improved some, but not back to normal baseline. Cough is prod with minimal clear sputum.   can do HT but no mall due to sob Prednisone really helped a lot but did not maintain improvement once finished >>pred taper add ppi bid and diet    02/21/2014 Follow up and Medication Review  Patient returns for follow-up and medication review. We reviewed all  his medications organize them into a medication count with patient education. It appears that he is take his medications correctly. Has begun pulmonary rehab, really likes it.  Patient has COPD flare last time was treated with prednisone. Feels that he did help. rec no change rx/ follow med calendar     03/29/2014 f/u ov/Jeremiah [name] re: GOLD III/ no med calendar/ misunderstanding saba  Chief Complaint  Patient presents with  . Follow-up    Pt states went to ED 03/27/14 and dxed with acute COPD exacerbation. He states that he started noticing having increased SOB on 03/25/14.  He states that today his breathing is doing much better, and feels he is almost back to his normal baseline.    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 He denies any mass leaks or excessive daytime sleepiness Patient denies any hemoptysis, chest pain, orthopnea, PND or leg swelling Remains on Symbicort and Spiriva. He is on chronic steroids with a baseline prednisone at 10 mg Feels breathing is doing better, loves pulmonary rehab.  'Is determined to not end up on O2" rec Continue on Symbicort and Spiriva .  Follow med calendar closely and bring to each visit.     06/03/2014 f/u ov/Jeremiah [name] re: GOLD III/ no med calendar /  confused again with instructions maint vs prns Chief Complaint  Patient presents with  . Follow-up    Pt c/o cough in the am for the past 2 wks. Cough is non prod. His breathing has improved.    cough worse in am and typically used saba before symbicort rec Prednisone 10 mg take  4 each am x 2 days,   2 each am x 2 days,  1 each am x 2 days and stop  Plan A = Automatic = symbicort 160 Take 2 puffs first thing in am and then another 2 puffs about 12 hours later.                                      Spiriva one capsule each am  Work on perfecting  inhaler technique:    Plan B = Only use your albuterol (proventil)  as a rescue medication  Plan C = nebulizer =  Albuterol ok to to use up to every 4 hours if needed but call right away for appt     07/07/2014 f/u ov/Annjeanette Sarwar re: copd III/ still confused with meds though does have med calendar now  Chief Complaint  Patient presents with  . Follow-up    Pt states that his cough is unmchanged since his last visit. His breathing had been better- relates to pulmonary rehab, but worse for the past 2 days- relates to "pollen".   rec If breathing worse and need the nebulizer more than rarely,go ahead and take Prednisone 10 mg take  4 each am x 2 days,   2 each am x 2 days,  1 each am x 2 days and stop     07/20/2014  Acute extended ov/Menachem Urbanek re: poorly controlled variable sob in setting of GOLD III copd Chief Complaint  Patient presents with  . Acute Visit    Pt c/o increased cough and SOB for the past 2 wks. He has also noticed wheezing. He states cough got was today- occ prod with clear sputum. He is using albuterol inhaler 3-4 times per day and neb a few times per wk.      actually doing pretty well with pulmonary rehab until the last few weeks, no desats reported  Waking up at night panicking due to sob better with or without saba if relaxes or uses xanax  Has med calendar but did not activate action plans  rec Any time you have a flare of cough/  wheeze/ nighttime disturbance > Prednisone 10 mg take  4 each am x 2 days,   2 each am x 2 days,  1 each am x 2 days and stop  Instead of clariton try the zyrtec as per your med calender Protonix should be 40 mg before bfast and Pepcid 20 mg one at bedtime as per calendar  GERD diet    08/03/2014 NP Follow up and Med Calendar : COPD III  Patient presents for a two-week follow-up and medication review We reviewed all his medications organize them into a medication calendar with patient education Appears he is taking his medications correctly. Last visit. Patient had a COPD flare. He was treated with a prednisone taper. Patient feels that he is much improved and back to his baseline. He is in the maintenance program at pulmonary rehabilitation and says he is doing very well.  Wears CPAP At bedtime rec Continue on Symbicort and Spiriva .  Follow med calendar closely and bring to each visit.  Follow up Dr. Melvyn Novas  In 3 months and As needed     11/01/2014 f/u ov/Piccola Arico re: Jake Seats III copd  Chief Complaint  Patient presents with  . Follow-up    COPD: breathing has been doing well.  Still waking up at night with dry, hacking cough. Needs refills on Xanax, Prednisone.    wakes up maybe once a week with dry cough, not using Plan B or C or prednisone from action plan and overusing xanax which he is now placing in pill box Still not able to translate the med calendar format into his routine rec No change rx Follow med calendar     12/12/2014 acute  ov/Dezerae Freiberger re: ? aecopd /new gi/gu complaints ? From spiriva?   Chief Complaint  Patient presents with  . Acute Visit    Pt c/o increased SOB and cough x 4 days. Cough is not very prod- clear sputum. He also c/o increased gas and bloating.  downhill since prev ov, changing his own maint rx (dc'd pepcid) >  severe cough at hs onset 12/09/14  On spiriva lots of gi/gu issues  Started pred one day prior to OV  As per calendar action plan  Has only used  saba so far in hfa form, has neb but doesn't feel he needs it yet.    No obvious day to day or daytime variability or assoc excess or purulent sputum  or cp or chest tightness, subjective wheeze or overt sinus or hb symptoms. No unusual exp hx or h/o childhood pna/ asthma or knowledge of premature birth.  Sleeping ok without nocturnal  or early am exacerbation  of respiratory  c/o's or need for noct saba. Also denies any obvious fluctuation of symptoms with weather or environmental changes or other aggravating or alleviating factors except as outlined above   Current Medications, Allergies, Complete Past Medical History, Past Surgical History, Family History, and Social History were reviewed in [employer] record.  ROS  The following are not active complaints unless bolded sore throat, dysphagia, dental problems, itching, sneezing,  nasal congestion or excess/ purulent secretions, ear ache,   fever, chills, sweats, unintended wt loss, classically pleuritic or exertional cp, hemoptysis,  orthopnea pnd or leg swelling, presyncope, palpitations, abdominal pain, anorexia, nausea, vomiting, diarrhea  or change in bowel or bladder habits/constipation/bloating/urinary hesitance, change in stools or urine, dysuria,hematuria,  rash, arthralgias, visual complaints, headache, numbness, weakness or ataxia or problems with walking or coordination,  change in mood/affect or memory.           Objective:       Physical Exam GEN: A/Ox3; pleasant , NAD, elderly , thin very anxious bm nad   07/07/2014        188  > 07/20/2014     182 08/03/2014 >>    11/01/2014  181 > 12/12/2014   177   Vital signs reviewed     HEENT:  [place]/AT,  EACs-clear, TMs-wnl, NOSE-clear, THROAT-clear, no lesions, no postnasal drip or exudate noted.  Top dentures   NECK:  Supple w/ fair ROM; no JVD; normal carotid impulses w/o bruits; no thyromegaly or nodules palpated; no lymphadenopathy.  RESP   bs are distant with  no wheezing or crackles.  no accessory muscle use, no dullness to percussion  CARD:  RRR, no m/r/g  , no peripheral edema, pulses intact, no cyanosis or clubbing.  GI:   Soft & nt; nml bowel sounds; no organomegaly or masses detected.  Musco: Warm bil, no deformities or joint swelling noted.   Neuro: alert, no focal deficits noted.    Skin: Warm, no lesions or rashes     I personally reviewed images and agree with radiology impression as follows:  CXR:   12/12/2014  COPD. There is no active cardiopulmonary disease.       Assessment:

## 2014-12-13 ENCOUNTER — Encounter: Payer: Self-pay | Admitting: Internal Medicine

## 2014-12-13 ENCOUNTER — Ambulatory Visit: Payer: Medicare Other | Admitting: Internal Medicine

## 2014-12-13 ENCOUNTER — Telehealth: Payer: Self-pay | Admitting: Internal Medicine

## 2014-12-13 ENCOUNTER — Encounter (HOSPITAL_COMMUNITY): Payer: Self-pay

## 2014-12-13 NOTE — Telephone Encounter (Signed)
Patient calling for CXR results. Patient notified of results. Nothing further needed.

## 2014-12-13 NOTE — Progress Notes (Signed)
Quick Note:  LMTCB ______ 

## 2014-12-13 NOTE — Assessment & Plan Note (Signed)
-   PFTs 01/24/2014  FEV1  1.30 (35%) ratio 49 with severe air trapping and no better p saba and dlco 48% -01/24/2014 p extensive coaching HFA effectiveness =    90%  - referred to rehab 01/24/14  -Med calendar 02/21/2014 > did not recognize it when reprinted 03/29/14 > reviewed , 08/03/2014  - 06/03/2014 p extensive coaching HFA effectiveness =    90%   - d/c spiriva 12/12/2014 > trial of tudorza due to gi/gu side effect  There is very little  evidence of COPD exacerbation but I do worry that he is having chronic  anticholinergic effects from use of spiriva so needs a trial off.  If his insurance covers it, the best choice here is incruse  The proper method of use, as well as anticipated side effects, of a metered-dose inhaler are discussed and demonstrated to the patient.    I had an extended discussion with the patient reviewing all relevant studies completed to date and  lasting 15 to 20 minutes of a 25 minute visit    Each maintenance medication was reviewed in detail including most importantly the difference between maintenance and prns and under what circumstances the prns are to be triggered using an action plan format that is not reflected in the computer generated alphabetically organized AVS.    Please see instructions for details which were reviewed in writing and the patient given a copy highlighting the part that I personally wrote and discussed at today's ov.

## 2014-12-13 NOTE — Assessment & Plan Note (Signed)
Adequate control on present rx, reviewed > no change in rx needed  But needs to establish with primary care.

## 2014-12-15 ENCOUNTER — Ambulatory Visit: Payer: Medicare Other | Admitting: Internal Medicine

## 2014-12-15 ENCOUNTER — Encounter (HOSPITAL_COMMUNITY)
Admission: RE | Admit: 2014-12-15 | Discharge: 2014-12-15 | Disposition: A | Discharge status: Home / Self Care | Payer: Self-pay | Source: Ambulatory Visit | Attending: Internal Medicine | Admitting: Internal Medicine

## 2014-12-15 DIAGNOSIS — J439 Emphysema, unspecified: Secondary | ICD-10-CM | POA: Insufficient documentation

## 2014-12-20 ENCOUNTER — Encounter (HOSPITAL_COMMUNITY)
Admission: RE | Admit: 2014-12-20 | Discharge: 2014-12-20 | Disposition: A | Discharge status: Home / Self Care | Payer: Self-pay | Source: Ambulatory Visit | Attending: Internal Medicine | Admitting: Internal Medicine

## 2014-12-22 ENCOUNTER — Encounter (HOSPITAL_COMMUNITY): Payer: Self-pay

## 2014-12-27 ENCOUNTER — Encounter (HOSPITAL_COMMUNITY): Payer: Self-pay

## 2014-12-28 ENCOUNTER — Encounter: Payer: Self-pay | Admitting: Family

## 2014-12-28 ENCOUNTER — Ambulatory Visit (INDEPENDENT_AMBULATORY_CARE_PROVIDER_SITE_OTHER): Payer: Medicare Other | Admitting: Family

## 2014-12-28 VITALS — BP 138/86 | HR 58 | Temp 97.7°F | Resp 20 | Ht 73.0 in | Wt 180.0 lb

## 2014-12-28 DIAGNOSIS — Z0000 Encounter for general adult medical examination without abnormal findings: Secondary | ICD-10-CM | POA: Diagnosis not present

## 2014-12-28 DIAGNOSIS — Z23 Encounter for immunization: Secondary | ICD-10-CM | POA: Diagnosis not present

## 2014-12-28 NOTE — Progress Notes (Signed)
Subjective:    Patient ID: Jeremiah [name], male    DOB: 09-Oct-1945, 69 y.o.   MRN: [id number]  Chief Complaint  Patient presents with  . Establish Care    wants to talk about general health and sexual issues    HPI:  Jeremiah [name] is a 69 y.o. male who presents today for an annual wellness visit.   1) Health Maintenance -   Diet - Averages about 2-3 meals per day consisting of chicken, beef, fish, fruits, vegetables, dairy; Caffeine intake of about 2-3 cups of per day  Exercise - Walks 2-3 times per week.   2) Preventative Exams / Immunizations:  Dental -- Up to date  Vision --Due for exam   Health Maintenance  Topic Date Due  . Hepatitis C Screening  10/04/1945  . COLONOSCOPY  09/17/1997  . ZOSTAVAX  09/18/2007  . PNA vac Low Risk Adult (1 of 2 - PCV13) 09/17/2012  . INFLUENZA VACCINE  11/14/2014  . TETANUS/TDAP  12/27/2024  Colonoscopy through [facility]; Tetanus and flu shot   Immunization History  Administered Date(s) Administered  . Influenza Split 01/13/2013  . Influenza, High Dose Seasonal PF 12/28/2014  . Influenza,inj,Quad PF,36+ Mos 01/24/2014  . Pneumococcal-Unspecified 04/15/2010  . Tdap 12/28/2014    RISK FACTORS  Tobacco History  Smoking status  . Former Smoker -- 1.00 packs/day for 30 years  . Types: Cigarettes  . Quit date: 04/16/1987  Smokeless tobacco  . Never Used     Cardiac risk factors: advanced age (older than 86 for men, 24 for women), dyslipidemia, hypertension and male gender.  Depression Screen  Q1: Over the past two weeks, have you felt down, depressed or hopeless? No  Q2: Over the past two weeks, have you felt little interest or pleasure in doing things? No  Have you lost interest or pleasure in daily life? No  Do you often feel hopeless? No  Do you cry easily over simple problems? No  Activities of Daily Living In your present state of health, do you have any difficulty performing the following activities?:  Driving?  No Managing money?  No Feeding yourself? No Getting from bed to chair? No Climbing a flight of stairs? Occasionally short of breath Preparing food and eating?: No Bathing or showering? No Getting dressed: No Getting to the toilet? No Using the toilet: No Moving around from place to place: No In the past year have you fallen or had a near fall?:No   Home Safety Has smoke detector and wears seat belts. No firearms. No excess sun exposure. Are there smokers in your home (other than you)?  No Do you feel safe at home?  Yes  Hearing Difficulties: No Do you often ask people to speak up or repeat themselves? No Do you experience ringing or noises in your ears? No  Do you have difficulty understanding soft or whispered voices? No    Cognitive Testing  Alert? Yes   Normal Appearance? Yes  Oriented to person? Yes  Place? Yes   Time? Yes  Recall of three objects?  Yes  Can perform simple calculations? Yes  Displays appropriate judgment? Yes  Can read the correct time from a watch face? Yes  Do you feel that you have a problem with memory? No  Do you often misplace items? No   Advanced Directives have been discussed with the patient? Yes  Current Physicians/Providers and Suppliers  1. Terri Piedra, FNP - Internal Medicine 2. Christinia Gully, MD - Pulmonology 3.  Niger Reed, MD - Internal Medicine ([place])  4. Georgianne Fick, MD - Pulmonologist  Indicate any recent Medical Services you may have received from other than [facility] providers in the past year (date may be approximate).  All answers were reviewed with the patient and necessary referrals were made:  Mauricio Po, [place]   12/28/2014    Review of Systems  Constitutional: Denies fever, chills, fatigue, or significant weight gain/loss. HENT: Head: Denies headache or neck pain Ears: Denies changes in hearing, ringing in ears, earache, drainage Nose: Denies discharge, stuffiness, itching, nosebleed, sinus pain Throat: Denies  sore throat, hoarseness, dry mouth, sores, thrush Eyes: Denies loss/changes in vision, pain, redness, blurry/double vision, flashing lights Cardiovascular: Denies chest pain/discomfort, tightness, palpitations, shortness of breath with activity, difficulty lying down, swelling, sudden awakening with shortness of breath Respiratory: Denies shortness of breath, cough, sputum production, wheezing Gastrointestinal: Denies dysphasia, heartburn, change in appetite, nausea, change in bowel habits, rectal bleeding, constipation, diarrhea, yellow skin or eyes Genitourinary: Denies frequency, urgency, burning/pain, blood in urine, incontinence, change in urinary strength. Musculoskeletal: Denies muscle/joint pain, stiffness, back pain, redness or swelling of joints, trauma Skin: Denies rashes, lumps, itching, dryness, color changes, or hair/nail changes Neurological: Denies dizziness, fainting, seizures, weakness, numbness, tingling, tremor Psychiatric - Denies nervousness, stress, depression or memory loss Endocrine: Denies heat or cold intolerance, sweating, frequent urination, excessive thirst, changes in appetite Hematologic: Denies ease of bruising or bleeding    Objective:     BP 138/86 mmHg  Pulse 58  Temp(Src) 97.7 F (36.5 C) (Oral)  Resp 20  Ht 6\' 1"  (1.854 m)  Wt 180 lb (81.647 kg)  BMI 23.75 kg/m2  SpO2 94% Nursing note and vital signs reviewed.   Physical Exam  Constitutional: He is oriented to person, place, and time. He appears well-developed and well-nourished.  HENT:  Head: Normocephalic.  Right Ear: Hearing, tympanic membrane, external ear and ear canal normal.  Left Ear: Hearing, tympanic membrane, external ear and ear canal normal.  Nose: Nose normal.  Mouth/Throat: Uvula is midline, oropharynx is clear and moist and mucous membranes are normal.  Eyes: Conjunctivae and EOM are normal. Pupils are equal, round, and reactive to light.  Neck: Neck supple. No JVD present. No  tracheal deviation present. No thyromegaly present.  Cardiovascular: Normal rate, regular rhythm, normal heart sounds and intact distal pulses.   Pulmonary/Chest: Effort normal and breath sounds normal.  Abdominal: Soft. Bowel sounds are normal. He exhibits no distension and no mass. There is no tenderness. There is no rebound and no guarding.  Musculoskeletal: Normal range of motion. He exhibits no edema or tenderness.  Lymphadenopathy:    He has no cervical adenopathy.  Neurological: He is alert and oriented to person, place, and time. He has normal reflexes. No cranial nerve deficit. He exhibits normal muscle tone. Coordination normal.  Skin: Skin is warm and dry.  Psychiatric: He has a normal mood and affect. His behavior is normal. Judgment and thought content normal.       Assessment & Plan:   During the course of the visit the patient was educated and counseled about appropriate screening and preventive services including:    Pneumococcal vaccine   Influenza vaccine  Td vaccine  Prostate cancer screening  Colorectal cancer screening  Nutrition counseling   Diet review for nutrition referral? Yes ____  Not Indicated _X___   Patient Instructions (the written plan) was given to the patient.  Medicare Attestation I have personally reviewed: The patient's  medical and social history Their use of alcohol, tobacco or illicit drugs Their current medications and supplements The patient's functional ability including ADLs,fall risks, home safety risks, cognitive, and hearing and visual impairment Diet and physical activities Evidence for depression or mood disorders  The patient's weight, height, BMI,  have been recorded in the chart.  I have made referrals, counseling, and provided education to the patient based on review of the above and I have provided the patient with a written personalized care plan for preventive services.     Mauricio Po, [place]   12/28/2014     Problem List Items Addressed This Visit      Other   Medicare annual wellness visit, subsequent    Reviewed and updated patient's medical, surgical, family and social history. Medications and allergies were also reviewed. Basic screenings for depression, activities of daily living, hearing, cognition and safety were performed. Provider list was updated and health plan was provided to the patient.   1) Anticipatory Guidance: Discussed importance of wearing a seatbelt while driving and not texting while driving; changing batteries in smoke detector at least once annually; wearing suntan lotion when outside; eating a balanced and moderate diet; getting physical activity at least 30 minutes per day.  2) Immunizations / Screenings / Labs:  Influenza and TDap updated today. Discussed Zostavax and Prevnar. Will get Prevnar at next visit and will consider Zostavax. All other immunizations are up to date per recommendations. Due for a vision screen which he will schedule independently. Due for colonoscopy which will be completed through the [facility]. All other screenings are up to date. Blood work completed at [place].   Overall well exam with risk factors for cardiovascular disease including hypertension which is adequately controlled with hydrochlorothiazide. Asthma appears adequately controlled with current regimen and is maintained by Pulmonology. Was having issues with depression but appear resolved at present. Continue to monitor as it may be labile. Denies falls. Discussed Advance Directives and how to update them. Continue healthy lifestyle behaviors and follow up office visit in 3 months or sooner.         Other Visit Diagnoses    Need for diphtheria-tetanus-pertussis (Tdap) vaccine, adult/adolescent    -  Primary    Relevant Orders    Tdap vaccine greater than or equal to 7yo IM (Completed)

## 2014-12-28 NOTE — Progress Notes (Signed)
Pre visit review using our clinic review tool, if applicable. No additional management support is needed unless otherwise documented below in the visit note. 

## 2014-12-28 NOTE — Patient Instructions (Addendum)
Thank you for choosing [facility].  Summary/Instructions:  Your prescription(s) have been submitted to your pharmacy or been printed and provided for you. Please take as directed and contact our office if you believe you are having problem(s) with the medication(s) or have any questions.  Please stop by the lab on the basement level of the building for your blood work. Your results will be released to Jeremiah [name] (or called to you) after review, usually within 72 hours after test completion. If any changes need to be made, you will be notified at that same time.  Health Maintenance  Topic Date Due  . Hepatitis C Screening  Apr 12, 1946  . COLONOSCOPY  09/17/1997  . ZOSTAVAX  09/18/2007  . PNA vac Low Risk Adult (1 of 2 - PCV13) 09/17/2012  . INFLUENZA VACCINE  11/14/2014  . TETANUS/TDAP  12/27/2024   Health Maintenance A healthy lifestyle and preventative care can promote health and wellness.  Maintain regular health, dental, and eye exams.  Eat a healthy diet. Foods like vegetables, fruits, whole grains, low-fat dairy products, and lean protein foods contain the nutrients you need and are low in calories. Decrease your intake of foods high in solid fats, added sugars, and salt. Get information about a proper diet from your health care provider, if necessary.  Regular physical exercise is one of the most important things you can do for your health. Most adults should get at least 150 minutes of moderate-intensity exercise (any activity that increases your heart rate and causes you to sweat) each week. In addition, most adults need muscle-strengthening exercises on 2 or more days a week.   Maintain a healthy weight. The body mass index (BMI) is a screening tool to identify possible weight problems. It provides an estimate of body fat based on height and weight. Your health care provider can find your BMI and can help you achieve or maintain a healthy weight. For males 20 years and  older:  A BMI below 18.5 is considered underweight.  A BMI of 18.5 to 24.9 is normal.  A BMI of 25 to 29.9 is considered overweight.  A BMI of 30 and above is considered obese.  Maintain normal blood lipids and cholesterol by exercising and minimizing your intake of saturated fat. Eat a balanced diet with plenty of fruits and vegetables. Blood tests for lipids and cholesterol should begin at age 55 and be repeated every 5 years. If your lipid or cholesterol levels are high, you are over age 72, or you are at high risk for heart disease, you may need your cholesterol levels checked more frequently.Ongoing high lipid and cholesterol levels should be treated with medicines if diet and exercise are not working.  If you smoke, find out from your health care provider how to quit. If you do not use tobacco, do not start.  Lung cancer screening is recommended for adults aged 85-80 years who are at high risk for developing lung cancer because of a history of smoking. A yearly low-dose CT scan of the lungs is recommended for people who have at least a 30-pack-year history of smoking and are current smokers or have quit within the past 15 years. A pack year of smoking is smoking an average of 1 pack of cigarettes a day for 1 year (for example, a 30-pack-year history of smoking could mean smoking 1 pack a day for 30 years or 2 packs a day for 15 years). Yearly screening should continue until the smoker has stopped smoking  for at least 15 years. Yearly screening should be stopped for people who develop a health problem that would prevent them from having lung cancer treatment.  If you choose to drink alcohol, do not have more than 2 drinks per day. One drink is considered to be 12 oz (360 mL) of beer, 5 oz (150 mL) of wine, or 1.5 oz (45 mL) of liquor.  Avoid the use of street drugs. Do not share needles with anyone. Ask for help if you need support or instructions about stopping the use of drugs.  High  blood pressure causes heart disease and increases the risk of stroke. Blood pressure should be checked at least every 1-2 years. Ongoing high blood pressure should be treated with medicines if weight loss and exercise are not effective.  If you are 74-45 years old, ask your health care provider if you should take aspirin to prevent heart disease.  Diabetes screening involves taking a blood sample to check your fasting blood sugar level. This should be done once every 3 years after age 33 if you are at a normal weight and without risk factors for diabetes. Testing should be considered at a younger age or be carried out more frequently if you are overweight and have at least 1 risk factor for diabetes.  Colorectal cancer can be detected and often prevented. Most routine colorectal cancer screening begins at the age of 27 and continues through age 32. However, your health care provider may recommend screening at an earlier age if you have risk factors for colon cancer. On a yearly basis, your health care provider may provide home test kits to check for hidden blood in the stool. A small camera at the end of a tube may be used to directly examine the colon (sigmoidoscopy or colonoscopy) to detect the earliest forms of colorectal cancer. Talk to your health care provider about this at age 84 when routine screening begins. A direct exam of the colon should be repeated every 5-10 years through age 29, unless early forms of precancerous polyps or small growths are found.  People who are at an increased risk for hepatitis B should be screened for this virus. You are considered at high risk for hepatitis B if:  You were born in a country where hepatitis B occurs often. Talk with your health care provider about which countries are considered high risk.  Your parents were born in a high-risk country and you have not received a shot to protect against hepatitis B (hepatitis B vaccine).  You have HIV or AIDS.  You  use needles to inject street drugs.  You live with, or have sex with, someone who has hepatitis B.  You are a man who has sex with other men (MSM).  You get hemodialysis treatment.  You take certain medicines for conditions like cancer, organ transplantation, and autoimmune conditions.  Hepatitis C blood testing is recommended for all people born from [birth year] through 1963 and any individual with known risk factors for hepatitis C.  Healthy men should no longer receive prostate-specific antigen (PSA) blood tests as part of routine cancer screening. Talk to your health care provider about prostate cancer screening.  Testicular cancer screening is not recommended for adolescents or adult males who have no symptoms. Screening includes self-exam, a health care provider exam, and other screening tests. Consult with your health care provider about any symptoms you have or any concerns you have about testicular cancer.  Practice safe sex. Use condoms and  avoid high-risk sexual practices to reduce the spread of sexually transmitted infections (STIs).  You should be screened for STIs, including gonorrhea and chlamydia if:  You are sexually active and are younger than 24 years.  You are older than 24 years, and your health care provider tells you that you are at risk for this type of infection.  Your sexual activity has changed since you were last screened, and you are at an increased risk for chlamydia or gonorrhea. Ask your health care provider if you are at risk.  If you are at risk of being infected with HIV, it is recommended that you take a prescription medicine daily to prevent HIV infection. This is called pre-exposure prophylaxis (PrEP). You are considered at risk if:  You are a man who has sex with other men (MSM).  You are a heterosexual man who is sexually active with multiple partners.  You take drugs by injection.  You are sexually active with a partner who has HIV.  Talk with  your health care provider about whether you are at high risk of being infected with HIV. If you choose to begin PrEP, you should first be tested for HIV. You should then be tested every 3 months for as long as you are taking PrEP.  Use sunscreen. Apply sunscreen liberally and repeatedly throughout the day. You should seek shade when your shadow is shorter than you. Protect yourself by wearing long sleeves, pants, a wide-brimmed hat, and sunglasses year round whenever you are outdoors.  Tell your health care provider of new moles or changes in moles, especially if there is a change in shape or color. Also, tell your health care provider if a mole is larger than the size of a pencil eraser.  A one-time screening for abdominal aortic aneurysm (AAA) and surgical repair of large AAAs by ultrasound is recommended for men aged 84-75 years who are current or former smokers.  Stay current with your vaccines (immunizations). Document Released: 09/28/2007 Document Revised: 04/06/2013 Document Reviewed: 08/27/2010 [facility] Patient Information 2015 [place], [place]. This information is not intended to replace advice given to you by your health care provider. Make sure you discuss any questions you have with your health care provider.  Advance Directive Advance directives are the legal documents that allow you to make choices about your health care and medical treatment if you cannot speak for yourself. Advance directives are a way for you to communicate your wishes to family, friends, and health care providers. The specified people can then convey your decisions about end-of-life care to avoid confusion if you should become unable to communicate. Ideally, the process of discussing and writing advance directives should happen over time rather than making decisions all at once. Advance directives can be modified as your situation changes, and you can change your mind at any time, even after you have signed the advance  directives. Each state has its own laws regarding advance directives. You may want to check with your health care provider, attorney, or state representative about the law in your state. Below are some examples of advance directives. LIVING WILL A living will is a set of instructions documenting your wishes about medical care when you cannot care for yourself. It is used if you become:  Terminally ill.  Incapacitated.  Unable to communicate.  Unable to make decisions. Items to consider in your living will include:  The use or non-use of life-sustaining equipment, such as dialysis machines and breathing machines (ventilators).  A  do not resuscitate (DNR) order, which is the instruction not to use cardiopulmonary resuscitation (CPR) if breathing or heartbeat stops.  Tube feeding.  Withholding of food and fluids.  Comfort (palliative) care when the goal becomes comfort rather than a cure.  Organ and tissue donation. A living will does not give instructions about distribution of your money and property if you should pass away. It is advisable to seek the expert advice of a lawyer in drawing up a will regarding your possessions. Decisions about taxes, beneficiaries, and asset distribution will be legally binding. This process can relieve your family and friends of any burdens surrounding disputes or questions that may come up about the allocation of your assets. DO NOT RESUSCITATE (DNR) A do not resuscitate (DNR) order is a request to not have CPR in the event that your heart stops beating or you stop breathing. Unless given other instructions, a health care provider will try to help any patient whose heart has stopped or who has stopped breathing.  HEALTH CARE PROXY AND DURABLE POWER OF ATTORNEY FOR HEALTH CARE A health care proxy is a person (agent) appointed to make medical decisions for you if you cannot. Generally, people choose someone they know well and trust to represent their  preferences when they can no longer do so. You should be sure to ask this person for agreement to act as your agent. An agent may have to exercise judgment in the event of a medical decision for which your wishes are not known. The durable power of attorney for health care is the legal document that names your health care proxy. Once written, it should be:  Signed.  Notarized.  Dated.  Copied.  Witnessed.  Incorporated into your medical record. You may also want to appoint someone to manage your financial affairs if you cannot. This is called a durable power of attorney for finances. It is a separate legal document from the durable power of attorney for health care. You may choose the same person or someone different from your health care proxy to act as your agent in financial matters. Document Released: 07/09/2007 Document Revised: 04/06/2013 Document Reviewed: 08/19/2012 [facility] Patient Information 2015 [place], [place]. This information is not intended to replace advice given to you by your health care provider. Make sure you discuss any questions you have with your health care provider.

## 2014-12-28 NOTE — Assessment & Plan Note (Signed)
Reviewed and updated patient's medical, surgical, family and social history. Medications and allergies were also reviewed. Basic screenings for depression, activities of daily living, hearing, cognition and safety were performed. Provider list was updated and health plan was provided to the patient.   1) Anticipatory Guidance: Discussed importance of wearing a seatbelt while driving and not texting while driving; changing batteries in smoke detector at least once annually; wearing suntan lotion when outside; eating a balanced and moderate diet; getting physical activity at least 30 minutes per day.  2) Immunizations / Screenings / Labs:  Influenza and TDap updated today. Discussed Zostavax and Prevnar. Will get Prevnar at next visit and will consider Zostavax. All other immunizations are up to date per recommendations. Due for a vision screen which he will schedule independently. Due for colonoscopy which will be completed through the [facility]. All other screenings are up to date. Blood work completed at [place].   Overall well exam with risk factors for cardiovascular disease including hypertension which is adequately controlled with hydrochlorothiazide. Asthma appears adequately controlled with current regimen and is maintained by Pulmonology. Was having issues with depression but appear resolved at present. Continue to monitor as it may be labile. Denies falls. Discussed Advance Directives and how to update them. Continue healthy lifestyle behaviors and follow up office visit in 3 months or sooner.

## 2014-12-29 ENCOUNTER — Telehealth (HOSPITAL_COMMUNITY): Payer: Self-pay | Admitting: *Deleted

## 2014-12-29 ENCOUNTER — Encounter (HOSPITAL_COMMUNITY): Payer: Self-pay

## 2015-01-03 ENCOUNTER — Encounter: Payer: Self-pay | Admitting: Adult Health

## 2015-01-03 ENCOUNTER — Encounter (HOSPITAL_COMMUNITY)
Admission: RE | Admit: 2015-01-03 | Discharge: 2015-01-03 | Disposition: A | Discharge status: Home / Self Care | Payer: Self-pay | Source: Ambulatory Visit | Attending: Internal Medicine | Admitting: Internal Medicine

## 2015-01-03 ENCOUNTER — Ambulatory Visit (INDEPENDENT_AMBULATORY_CARE_PROVIDER_SITE_OTHER): Payer: Medicare Other | Admitting: Adult Health

## 2015-01-03 VITALS — BP 118/84 | HR 65 | Temp 97.3°F | Ht 73.0 in | Wt 182.0 lb

## 2015-01-03 DIAGNOSIS — J449 Chronic obstructive pulmonary disease, unspecified: Secondary | ICD-10-CM

## 2015-01-03 NOTE — Patient Instructions (Signed)
Continue on Symbicort and Tudorza  Follow med calendar closely and bring to each visit.  Follow up Dr. Melvyn Novas  In 3 -4 months and As needed   Please contact office for sooner follow up if symptoms do not improve or worsen or seek emergency care

## 2015-01-03 NOTE — Assessment & Plan Note (Signed)
Compensated on present regimen  Urinary issues improved off Spiriva   Plan  Continue on Symbicort and Tudorza  Follow med calendar closely and bring to each visit.  Follow up Dr. Melvyn Novas  In 3 -4 months and As needed   Please contact office for sooner follow up if symptoms do not improve or worsen or seek emergency care

## 2015-01-03 NOTE — Addendum Note (Signed)
Addended by: Osa Craver on: 01/03/2015 03:14 PM   Modules accepted: Orders, Medications

## 2015-01-03 NOTE — Progress Notes (Signed)
Subjective:     Patient ID: Jeremiah [name], male   DOB: 12/05/1945   MRN: [id number]    Brief patient profile:  21 yobm quit smoking 1989 with no problems with variable sob x 2004 and maintained on inhalers referred 05/03/2013 by Dr Titus Mould for evaluation of outpt management for copd after hospitalization and found to have GOLD III COPD 01/2014  with tendency to panic when sob.  Admit date: 04/24/2013  Discharge date: 04/27/2013  Discharge Diagnoses:  Active Problems:  COPD exacerbation  Detailed Hospital Course:  69 yo male with abrupt onset of sob (1/10) that progressively worsened until EMS called. Patient intubated in ED due to severe respiratory distress. In route, patient had increasing tachypnea, decline SpO2, and obtundation. Patient with GCS <8 at arrival, evidence of emesis, intubated for severe respiratory distress. Family reported no evidence of fevers, chills, malaise, worsening coughing, and sob. Initial episode passed, however sister received a call later with him reporting a return of symptoms, complaints of inability of breathing and EMS alerted. Distant history of tobacco abuse. Has never been hospitalized for COPD before this episode. He has had no recent hospitalizations, and has never been previously intubated.  He was admitted to the ICU on 1/11 and was treated with the typical modalities for acute respiratory failure (for presumed AECODP) which included mechanical ventilation with sedation, inhaled bronchodilators, antibiotics, and systemic steroid therapy. He improved later that day and was able to be extubated and sedation was stopped. His care since that point has primarily focused on the de-escalation of the therapies mentioned above. On 1/13 he has returned to a near-baseline state of health and has been deemed a candidate for discharge.  Discharge Plan by diagnoses  Acute exacerbation of COPD  Acute hypercarbic respiratory failure (resolved)  plan  -Follow up with Dr. Melvyn Novas  on 05/03/2013 at 2:30PM  -Change steroids to PO Prednisone, taper over 7-10d  -Resume home dose BDs    History of Present Illness  05/03/2013 1st [place] [facility] office visit/ Wert cc more sob since Oct 2014 on symbicort 160 2 bid and spiriva daily  But still  daily use of proventil and no need for neb then needed neb x sev months prior to his admit above but back to baseline since d/c on pred still tapering off but using saba qid, not prn "out of habit" >>return for PFT   01/03/2014 Follow up  [name] .  Complains of increased episodes of increased DOE, wheezing, prod cough with white/clear mucus worse for last week. Family member says he has been progressively get worse over last year. Was admitted 04/2013 w/ COPD flare /VDRF . [name] from last office visit however did not follow up .   Followed at [facility] , says he was told he has Moderate COPD 6 yrs ago. On Symbicort and Spiriva.  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  Says he gets winded easily esp on incline.  Says worse for last week with increased cough and wheezing.  No hemotpysis, wt loss, calf pain, n/v/d, chest pain or fever.  Last abx ~2 months ago.  rec Doxycycline 100mg  Twice daily  For 7 days  Prednisone taper over next week Mucinex DM Twice daily  As needed  Cough /congestion  Continue on Symbicort and Spiriva .   01/24/2014 f/u ov/Wert re:  GOLD III Chief Complaint  Patient presents with  . Follow-up    PFT done today. Pt states that SOB and cough have improved some, but not back to normal baseline. Cough is prod with minimal clear sputum.   can do HT but no mall due to sob Prednisone really helped a lot but did not maintain improvement once finished >>pred taper add ppi bid and diet    02/21/2014 Follow up and Medication Review  Patient returns for follow-up and medication review. We reviewed all  his medications organize them into a medication count with patient education. It appears that he is take his medications correctly. Has begun pulmonary rehab, really likes it.  Patient has COPD flare last time was treated with prednisone. Feels that he did help. rec no change rx/ follow med calendar     03/29/2014 f/u ov/Wert re: GOLD III/ no med calendar/ misunderstanding saba  Chief Complaint  Patient presents with  . Follow-up    Pt states went to ED 03/27/14 and dxed with acute COPD exacerbation. He states that he started noticing having increased SOB on 03/25/14.  He states that today his breathing is doing much better, and feels he is almost back to his normal baseline.    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 He denies any mass leaks or excessive daytime sleepiness Patient denies any hemoptysis, chest pain, orthopnea, PND or leg swelling Remains on Symbicort and Spiriva. He is on chronic steroids with a baseline prednisone at 10 mg Feels breathing is doing better, loves pulmonary rehab.  'Is determined to not end up on O2" rec Continue on Symbicort and Spiriva .  Follow med calendar closely and bring to each visit.     06/03/2014 f/u ov/Wert re: GOLD III/ no med calendar /  confused again with instructions maint vs prns Chief Complaint  Patient presents with  . Follow-up    Pt c/o cough in the am for the past 2 wks. Cough is non prod. His breathing has improved.    cough worse in am and typically used saba before symbicort rec Prednisone 10 mg take  4 each am x 2 days,   2 each am x 2 days,  1 each am x 2 days and stop  Plan A = Automatic = symbicort 160 Take 2 puffs first thing in am and then another 2 puffs about 12 hours later.                                      Spiriva one capsule each am  Work on perfecting  inhaler technique:    Plan B = Only use your albuterol (proventil)  as a rescue medication  Plan C = nebulizer =  Albuterol ok to to use up to every 4 hours if needed but call right away for appt     07/07/2014 f/u ov/Wert re: copd III/ still confused with meds though does have med calendar now  Chief Complaint  Patient presents with  . Follow-up    Pt states that his cough is unmchanged since his last visit. His breathing had been better- relates to pulmonary rehab, but worse for the past 2 days- relates to "pollen".   rec If breathing worse and need the nebulizer more than rarely,go ahead and take Prednisone 10 mg take  4 each am x 2 days,   2 each am x 2 days,  1 each am x 2 days and stop     07/20/2014  Acute extended ov/Wert re: poorly controlled variable sob in setting of GOLD III copd Chief Complaint  Patient presents with  . Acute Visit    Pt c/o increased cough and SOB for the past 2 wks. He has also noticed wheezing. He states cough got was today- occ prod with clear sputum. He is using albuterol inhaler 3-4 times per day and neb a few times per wk.      actually doing pretty well with pulmonary rehab until the last few weeks, no desats reported  Waking up at night panicking due to sob better with or without saba if relaxes or uses xanax  Has med calendar but did not activate action plans  rec Any time you have a flare of cough/  wheeze/ nighttime disturbance > Prednisone 10 mg take  4 each am x 2 days,   2 each am x 2 days,  1 each am x 2 days and stop  Instead of clariton try the zyrtec as per your med calender Protonix should be 40 mg before bfast and Pepcid 20 mg one at bedtime as per calendar  GERD diet    08/03/2014 NP Follow up and Med Calendar : COPD III  Patient presents for a two-week follow-up and medication review We reviewed all his medications organize them into a medication calendar with patient education Appears he is taking his medications correctly. Last visit. Patient had a COPD flare. He was treated with a prednisone taper. Patient feels that he is much improved and back to his baseline. He is in the maintenance program at pulmonary rehabilitation and says he is doing very well.  Wears CPAP At bedtime rec Continue on Symbicort and Spiriva .  Follow med calendar closely and bring to each visit.  Follow up Dr. Melvyn Novas  In 3 months and As needed     11/01/2014 f/u ov/Wert re: Jake Seats III copd  Chief Complaint  Patient presents with  . Follow-up    COPD: breathing has been doing well.  Still waking up at night with dry, hacking cough. Needs refills on Xanax, Prednisone.    wakes up maybe once a week with dry cough, not using Plan B or C or prednisone from action plan and overusing xanax which he is now placing in pill box Still not able to translate the med calendar format into his routine rec No change rx Follow med calendar     12/12/2014 acute  ov/Wert re: ? aecopd /new gi/gu complaints ? From spiriva?   Chief Complaint  Patient presents with  . Acute Visit    Pt c/o increased SOB and cough x 4 days. Cough is not very prod- clear sputum. He also c/o increased gas and bloating.  downhill since prev ov, changing his own maint rx (dc'd pepcid) >  severe cough at hs onset 12/09/14  On spiriva lots of gi/gu issues  Started pred one day prior to OV  As per calendar action plan  Has only used  saba so far in [facility] form, has neb but doesn't feel he needs it yet.  >changed spiriva to [place]   01/03/2015 Follow up : COPD  Pt returns for 1 month follow up  Doing well on since last ov  Spiriva was changed to [place] last ov d/t urinary retention/freq.  Urination is better off spiriva  Reviewed all meds and organized them into a med calendar with pt education  Appears to be taking correctly  Denies chest pain, orthopnea, hemoptyiss , fever.  Flu shot is utd.     Current Medications, Allergies, Complete Past Medical History, Past Surgical History, Family History, and Social History were reviewed in [employer] record.  ROS  The following are not active complaints unless bolded sore throat, dysphagia, dental problems, itching, sneezing,  nasal congestion or excess/ purulent secretions, ear ache,   fever, chills, sweats, unintended wt loss, classically pleuritic or exertional cp, hemoptysis,  orthopnea pnd or leg swelling, presyncope, palpitations, abdominal pain, anorexia, nausea, vomiting, diarrhea  ,   rash, arthralgias, visual complaints, headache, numbness, weakness or ataxia or problems with walking or coordination,  change in mood/affect or memory.           Objective:       Physical Exam GEN: A/Ox3; pleasant , NAD, elderly , thin very anxious bm nad   07/07/2014        188  > 07/20/2014     182 08/03/2014 >>    11/01/2014  181 > 12/12/2014   177 >182 01/03/2015   Vital signs reviewed     HEENT:  [place]/AT,  EACs-clear, TMs-wnl, NOSE-clear, THROAT-clear, no lesions, no postnasal drip or exudate noted.  Top dentures   NECK:  Supple w/ fair ROM; no JVD; normal carotid impulses w/o bruits; no thyromegaly or nodules palpated; no lymphadenopathy.  RESP   bs are distant with no wheezing or crackles.  no accessory muscle use, no dullness to percussion  CARD:  RRR, no m/r/g  , no peripheral edema, pulses intact, no cyanosis or clubbing.  GI:   Soft & nt; nml  bowel sounds; no organomegaly or masses detected.  Musco: Warm bil, no deformities or joint swelling noted.   Neuro: alert, no focal deficits noted.    Skin: Warm, no lesions or rashes       CXR:   12/12/2014  COPD. There is no active cardiopulmonary disease.       Assessment:

## 2015-01-03 NOTE — Progress Notes (Signed)
Chart and office note reviewed in detail along with available xrays/ labs > agree with a/p as outlined - appears spiriva was the cause of his urinay symptoms

## 2015-01-05 ENCOUNTER — Encounter (HOSPITAL_COMMUNITY)
Admission: RE | Admit: 2015-01-05 | Discharge: 2015-01-05 | Disposition: A | Discharge status: Home / Self Care | Payer: Self-pay | Source: Ambulatory Visit | Attending: Internal Medicine | Admitting: Internal Medicine

## 2015-01-10 ENCOUNTER — Encounter (HOSPITAL_COMMUNITY)
Admission: RE | Admit: 2015-01-10 | Discharge: 2015-01-10 | Disposition: A | Discharge status: Home / Self Care | Payer: Self-pay | Source: Ambulatory Visit | Attending: Internal Medicine | Admitting: Internal Medicine

## 2015-01-10 DIAGNOSIS — R5383 Other fatigue: Secondary | ICD-10-CM | POA: Diagnosis not present

## 2015-01-10 DIAGNOSIS — E039 Hypothyroidism, unspecified: Secondary | ICD-10-CM | POA: Diagnosis not present

## 2015-01-10 DIAGNOSIS — E349 Endocrine disorder, unspecified: Secondary | ICD-10-CM | POA: Diagnosis not present

## 2015-01-12 ENCOUNTER — Encounter (HOSPITAL_COMMUNITY): Admission: RE | Admit: 2015-01-12 | Payer: Non-veteran care | Source: Ambulatory Visit

## 2015-01-17 ENCOUNTER — Encounter (HOSPITAL_COMMUNITY): Payer: Non-veteran care

## 2015-01-17 DIAGNOSIS — J439 Emphysema, unspecified: Secondary | ICD-10-CM | POA: Insufficient documentation

## 2015-01-18 DIAGNOSIS — K649 Unspecified hemorrhoids: Secondary | ICD-10-CM | POA: Diagnosis not present

## 2015-01-18 DIAGNOSIS — Z86010 Personal history of colonic polyps: Secondary | ICD-10-CM | POA: Diagnosis not present

## 2015-01-19 ENCOUNTER — Encounter (HOSPITAL_COMMUNITY): Payer: Self-pay

## 2015-01-23 DIAGNOSIS — F330 Major depressive disorder, recurrent, mild: Secondary | ICD-10-CM | POA: Diagnosis not present

## 2015-01-24 ENCOUNTER — Encounter (HOSPITAL_COMMUNITY): Payer: Self-pay

## 2015-01-26 ENCOUNTER — Encounter (HOSPITAL_COMMUNITY): Payer: Self-pay

## 2015-01-27 ENCOUNTER — Other Ambulatory Visit: Payer: Self-pay | Admitting: Internal Medicine

## 2015-01-27 MED ORDER — ALPRAZOLAM 0.5 MG PO TABS: ORAL_TABLET | ORAL | Status: DC

## 2015-01-31 ENCOUNTER — Encounter (HOSPITAL_COMMUNITY): Payer: Self-pay

## 2015-02-01 DIAGNOSIS — J302 Other seasonal allergic rhinitis: Secondary | ICD-10-CM | POA: Diagnosis not present

## 2015-02-01 DIAGNOSIS — G4700 Insomnia, unspecified: Secondary | ICD-10-CM | POA: Diagnosis not present

## 2015-02-01 DIAGNOSIS — E291 Testicular hypofunction: Secondary | ICD-10-CM | POA: Diagnosis not present

## 2015-02-01 DIAGNOSIS — I10 Essential (primary) hypertension: Secondary | ICD-10-CM | POA: Diagnosis not present

## 2015-02-01 DIAGNOSIS — J449 Chronic obstructive pulmonary disease, unspecified: Secondary | ICD-10-CM | POA: Diagnosis not present

## 2015-02-02 ENCOUNTER — Encounter (HOSPITAL_COMMUNITY): Payer: Self-pay

## 2015-02-07 ENCOUNTER — Encounter (HOSPITAL_COMMUNITY): Payer: Self-pay

## 2015-02-09 ENCOUNTER — Encounter (HOSPITAL_COMMUNITY): Payer: Self-pay

## 2015-02-10 DIAGNOSIS — F330 Major depressive disorder, recurrent, mild: Secondary | ICD-10-CM | POA: Diagnosis not present

## 2015-02-14 ENCOUNTER — Encounter (HOSPITAL_COMMUNITY): Payer: Self-pay

## 2015-02-16 ENCOUNTER — Encounter (HOSPITAL_COMMUNITY): Payer: Self-pay

## 2015-02-21 ENCOUNTER — Encounter (HOSPITAL_COMMUNITY): Payer: Self-pay

## 2015-02-23 ENCOUNTER — Encounter (HOSPITAL_COMMUNITY): Payer: Self-pay

## 2015-02-28 ENCOUNTER — Encounter (HOSPITAL_COMMUNITY): Payer: Self-pay

## 2015-03-02 ENCOUNTER — Encounter (HOSPITAL_COMMUNITY): Payer: Self-pay

## 2015-03-07 ENCOUNTER — Encounter (HOSPITAL_COMMUNITY): Payer: Self-pay

## 2015-03-09 ENCOUNTER — Encounter (HOSPITAL_COMMUNITY): Payer: Self-pay

## 2015-03-13 ENCOUNTER — Encounter: Payer: Self-pay | Admitting: Internal Medicine

## 2015-03-13 ENCOUNTER — Ambulatory Visit (INDEPENDENT_AMBULATORY_CARE_PROVIDER_SITE_OTHER): Payer: Medicare Other | Admitting: Internal Medicine

## 2015-03-13 VITALS — BP 160/94 | HR 88 | Temp 98.6°F | Wt 190.0 lb

## 2015-03-13 DIAGNOSIS — J449 Chronic obstructive pulmonary disease, unspecified: Secondary | ICD-10-CM | POA: Diagnosis not present

## 2015-03-13 DIAGNOSIS — J069 Acute upper respiratory infection, unspecified: Secondary | ICD-10-CM | POA: Diagnosis not present

## 2015-03-13 MED ORDER — METHYLPREDNISOLONE ACETATE 80 MG/ML IJ SUSP
80.0000 mg | Freq: Once | INTRAMUSCULAR | Status: AC
  Administered 2015-03-13: 80 mg via INTRAMUSCULAR

## 2015-03-13 MED ORDER — LEVOFLOXACIN 500 MG PO TABS: 500.0000 mg | ORAL_TABLET | Freq: Every day | ORAL | Status: DC

## 2015-03-13 MED ORDER — PROMETHAZINE-CODEINE 6.25-10 MG/5ML PO SYRP
5.0000 mL | ORAL_SOLUTION | ORAL | Status: DC | PRN
Start: 2015-03-13 — End: 2015-05-02

## 2015-03-13 NOTE — Progress Notes (Signed)
Pre visit review using our clinic review tool, if applicable. No additional management support is needed unless otherwise documented below in the visit note. 

## 2015-03-13 NOTE — Assessment & Plan Note (Signed)
11/16 worse Depomedrol 80 mg IM

## 2015-03-13 NOTE — Progress Notes (Signed)
Subjective:  Patient ID: Jeremiah [name], male    DOB: Nov 03, 1945  Age: 69 y.o. MRN: [id number]  CC: Cough; Ear Fullness; and Sinusitis   HPI Dontrelle Sieker presents for URI sx's since last wed - brown mucus. Pt is on prednisone taper  Outpatient Prescriptions Prior to Visit  Medication Sig Dispense Refill  . Aclidinium Bromide (TUDORZA PRESSAIR) 400 MCG/ACT AEPB Inhale 1 puff into the lungs 2 (two) times daily. One twice daily 1 each 11  . albuterol (PROVENTIL HFA;VENTOLIN HFA) 108 (90 BASE) MCG/ACT inhaler Inhale 2 puffs into the lungs every 4 (four) hours as needed for wheezing or shortness of breath (((PLAN B))).    [name] Kitchen albuterol (PROVENTIL) (2.5 MG/3ML) 0.083% nebulizer solution Take 3 mLs by nebulization every 4 (four) hours as needed for wheezing or shortness of breath (((PLAN C))).     [name] Kitchen ALPRAZolam (XANAX) 0.5 MG tablet TAKE 1 TABLET BY MOUTH 2 TIMES DAILY AS NEEDED FOR ANXIETY. 60 tablet 1  . budesonide-formoterol (SYMBICORT) 160-4.5 MCG/ACT inhaler Inhale 2 puffs into the lungs 2 (two) times daily.    . cetirizine (ZYRTEC) 10 MG tablet Take 10 mg by mouth at bedtime as needed (drainage).     [name] Kitchen dextromethorphan-guaiFENesin (MUCINEX DM) 30-600 MG per 12 hr tablet Take 1 tablet by mouth 2 (two) times daily as needed (cough and congestion).     . famotidine (PEPCID) 20 MG tablet Take 20 mg by mouth at bedtime.    . hydrochlorothiazide (HYDRODIURIL) 25 MG tablet Take 25 mg by mouth every morning.     . magnesium oxide (MAG-OX) 400 MG tablet Take 400 mg by mouth 2 (two) times daily. For leg cramps    . Multiple Vitamin (CALCIUM COMPLEX PO) Take 3 tablets by mouth every morning.     . Multiple Vitamin (MULTIVITAMIN WITH MINERALS) TABS tablet Take 3 tablets by mouth every morning.     . Multiple Vitamins-Minerals (MULTIVITAMIN ADULT PO) Take 1 tablet by mouth every morning.    . naproxen sodium (ANAPROX) 220 MG tablet Per bottle as needed for joint pain    . NON FORMULARY PeriodGard Take 1 tsp  every morning and swish and spit    . NON FORMULARY Use CPAP at bedtime    . predniSONE (DELTASONE) 10 MG tablet Take  4 each am x 2 days,   2 each am x 2 days,  1 each am x 2 days and stop (Patient taking differently: Taper as directed if breathing worsens) 14 tablet 11  . fluticasone (FLONASE) 50 MCG/ACT nasal spray Place 2 sprays into both nostrils 2 (two) times daily.    . pantoprazole (PROTONIX) 20 MG tablet Take 20 mg by mouth 2 (two) times daily before a meal.     No facility-administered medications prior to visit.    ROS Review of Systems  Constitutional: Negative for appetite change, fatigue and unexpected weight change.  HENT: Positive for congestion, postnasal drip, rhinorrhea and sinus pressure. Negative for nosebleeds, sneezing, sore throat and trouble swallowing.   Eyes: Negative for itching and visual disturbance.  Respiratory: Positive for cough and shortness of breath.   Cardiovascular: Negative for chest pain, palpitations and leg swelling.  Gastrointestinal: Negative for nausea, diarrhea, blood in stool and abdominal distention.  Genitourinary: Negative for frequency and hematuria.  Musculoskeletal: Negative for back pain, joint swelling, gait problem and neck pain.  Skin: Negative for rash.  Neurological: Negative for dizziness, tremors, speech difficulty and weakness.  Psychiatric/Behavioral: Negative for sleep disturbance, dysphoric  mood and agitation. The patient is not nervous/anxious.     Objective:  BP 160/94 mmHg  Pulse 88  Temp(Src) 98.6 F (37 C) (Oral)  Wt 190 lb (86.183 kg)  SpO2 91%  BP Readings from Last 3 Encounters:  03/13/15 160/94  01/03/15 118/84  12/28/14 138/86    Wt Readings from Last 3 Encounters:  03/13/15 190 lb (86.183 kg)  01/03/15 182 lb (82.555 kg)  12/28/14 180 lb (81.647 kg)    Physical Exam  Constitutional: He is oriented to person, place, and time. He appears well-developed. No distress.  NAD  HENT:  Mouth/Throat: No  oropharyngeal exudate.  Eyes: Conjunctivae are normal. Pupils are equal, round, and reactive to light.  Neck: Normal range of motion. No JVD present. No thyromegaly present.  Cardiovascular: Normal rate, regular rhythm, normal heart sounds and intact distal pulses.  Exam reveals no gallop and no friction rub.   No murmur heard. Pulmonary/Chest: Effort normal and breath sounds normal. No respiratory distress. He has no wheezes. He has no rales. He exhibits no tenderness.  Abdominal: Soft. Bowel sounds are normal. He exhibits no distension and no mass. There is no tenderness. There is no rebound and no guarding.  Musculoskeletal: Normal range of motion. He exhibits no edema or tenderness.  Lymphadenopathy:    He has no cervical adenopathy.  Neurological: He is alert and oriented to person, place, and time. He has normal reflexes. No cranial nerve deficit. He exhibits normal muscle tone. He displays a negative Romberg sign. Coordination and gait normal.  Skin: Skin is warm and dry. No rash noted.  Psychiatric: He has a normal mood and affect. His behavior is normal. Judgment and thought content normal.  eryth nares and eryth throat  Lab Results  Component Value Date   WBC 9.0 03/27/2014   HGB 16.7 03/27/2014   HCT 49.5 03/27/2014   PLT 277 03/27/2014   GLUCOSE 124* 03/27/2014   TRIG 29 04/25/2013   NA 135* 03/27/2014   K 3.5* 03/27/2014   CL 96 03/27/2014   CREATININE 0.95 03/27/2014   BUN 11 03/27/2014   CO2 24 03/27/2014   INR 1.03 04/25/2013    No results found.  Assessment & Plan:   There are no diagnoses linked to this encounter. I am having Mr. Hakeem maintain his budesonide-formoterol, hydrochlorothiazide, magnesium oxide, multivitamin with minerals, dextromethorphan-guaiFENesin, albuterol, albuterol, Multiple Vitamin (CALCIUM COMPLEX PO), fluticasone, cetirizine, predniSONE, Aclidinium Bromide, Multiple Vitamins-Minerals (MULTIVITAMIN ADULT PO), famotidine, pantoprazole, NON  FORMULARY, NON FORMULARY, naproxen sodium, ALPRAZolam, esomeprazole, clonazePAM, cyanocobalamin, B-D 3CC LUER-LOK SYR 25GX1", testosterone cypionate, and buPROPion.  Meds ordered this encounter  Medications  . esomeprazole (NEXIUM) 40 MG capsule    Sig: Take 40 mg by mouth daily.    Refill:  2  . clonazePAM (KLONOPIN) 0.5 MG tablet    Sig: at bedtime as needed.    Refill:  2  . cyanocobalamin (,VITAMIN B-12,) 1000 MCG/ML injection    Sig: every 21 ( twenty-one) days.    Refill:  1  . B-D 3CC LUER-LOK SYR 25GX1" 25G X 1" 3 ML MISC    Sig: every 21 ( twenty-one) days.    Refill:  1  . testosterone cypionate (DEPOTESTOSTERONE CYPIONATE) 200 MG/ML injection    Sig: every 21 ( twenty-one) days.    Refill:  0  . buPROPion (WELLBUTRIN XL) 150 MG 24 hr tablet    Sig: Take 1 tablet by mouth every morning.    Refill:  4  Follow-up: No Follow-up on file.  Walker Kehr, MD

## 2015-03-13 NOTE — Assessment & Plan Note (Signed)
Levaquin x 10 d Prom-cod syr prn Rx

## 2015-03-14 ENCOUNTER — Encounter (HOSPITAL_COMMUNITY): Payer: Self-pay

## 2015-03-16 ENCOUNTER — Encounter (HOSPITAL_COMMUNITY): Payer: Self-pay

## 2015-03-21 ENCOUNTER — Encounter (HOSPITAL_COMMUNITY): Payer: Self-pay

## 2015-03-23 ENCOUNTER — Encounter (HOSPITAL_COMMUNITY): Payer: Self-pay

## 2015-03-28 ENCOUNTER — Encounter (HOSPITAL_COMMUNITY): Payer: Self-pay

## 2015-03-30 ENCOUNTER — Encounter (HOSPITAL_COMMUNITY): Payer: Self-pay

## 2015-04-04 ENCOUNTER — Ambulatory Visit (INDEPENDENT_AMBULATORY_CARE_PROVIDER_SITE_OTHER): Payer: Medicare Other | Admitting: Internal Medicine

## 2015-04-04 ENCOUNTER — Encounter (HOSPITAL_COMMUNITY): Payer: Self-pay

## 2015-04-04 ENCOUNTER — Encounter: Payer: Self-pay | Admitting: Internal Medicine

## 2015-04-04 ENCOUNTER — Ambulatory Visit (INDEPENDENT_AMBULATORY_CARE_PROVIDER_SITE_OTHER)
Admission: RE | Admit: 2015-04-04 | Discharge: 2015-04-04 | Disposition: A | Discharge status: Home / Self Care | Payer: Medicare Other | Source: Ambulatory Visit | Attending: Internal Medicine | Admitting: Internal Medicine

## 2015-04-04 DIAGNOSIS — J449 Chronic obstructive pulmonary disease, unspecified: Secondary | ICD-10-CM

## 2015-04-04 MED ORDER — FLUTTER DEVI: Status: DC

## 2015-04-04 NOTE — Progress Notes (Signed)
Subjective:     Patient ID: Jeremiah [name], Jeremiah [name]   DOB: 07/13/1945   MRN: [id number]    Brief patient profile:  38 yobm quit smoking 1989 with no problems with variable sob x 2004 and maintained on inhalers referred 05/03/2013 by Jeremiah [name] for evaluation of outpt management for copd after hospitalization and found to have GOLD III COPD 01/2014  with tendency to panic when sob.  Admit date: 04/24/2013  Discharge date: 04/27/2013  Discharge Diagnoses:  Active Problems:  COPD exacerbation  Detailed Hospital Course:  69 yo Jeremiah [name] with abrupt onset of sob (1/10) that progressively worsened until EMS called. Patient intubated in ED due to severe respiratory distress. In route, patient had increasing tachypnea, decline SpO2, and obtundation. Patient with GCS <8 at arrival, evidence of emesis, intubated for severe respiratory distress. Family reported no evidence of fevers, chills, malaise, worsening coughing, and sob. Initial episode passed, however sister received a call later with him reporting a return of symptoms, complaints of inability of breathing and EMS alerted. Distant history of tobacco abuse. Has never been hospitalized for COPD before this episode. He has had no recent hospitalizations, and has never been previously intubated.  He was admitted to the ICU on 1/11 and was treated with the typical modalities for acute respiratory failure (for presumed AECODP) which included mechanical ventilation with sedation, inhaled bronchodilators, antibiotics, and systemic steroid therapy. He improved later that day and was able to be extubated and sedation was stopped. His care since that point has primarily focused on the de-escalation of the therapies mentioned above. On 1/13 he has returned to a near-baseline state of health and has been deemed a candidate for discharge.  Discharge Plan by diagnoses  Acute exacerbation of COPD  Acute hypercarbic respiratory failure (resolved)  plan  -Follow up with Jeremiah [name]  on 05/03/2013 at 2:30PM  -Change steroids to PO Prednisone, taper over 7-10d  -Resume home dose BDs    History of Present Illness  05/03/2013 1st [facility] office visit/ Jeremiah [name] cc more sob since Oct 2014 on symbicort 160 2 bid and spiriva daily  But still  daily use of proventil and no need for neb then needed neb x sev months prior to his admit above but back to baseline since d/c on pred still tapering off but using saba qid, not prn "out of habit" >>return for PFT   01/03/2014 Follow up  [name] .  Complains of increased episodes of increased DOE, wheezing, prod cough with white/clear mucus worse for last week. Family member says he has been progressively get worse over last year. Was admitted 04/2013 w/ COPD flare /VDRF . [name] from last office visit however did not follow up .   Followed at [facility] , says he was told he has Moderate COPD 6 yrs ago. On Symbicort and Spiriva.  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  Says he gets winded easily esp on incline.  Says worse for last week with increased cough and wheezing.  No hemotpysis, wt loss, calf pain, n/v/d, chest pain or fever.  Last abx ~2 months ago.  rec Doxycycline 100mg  Twice daily  For 7 days  Prednisone taper over next week Mucinex DM Twice daily  As needed  Cough /congestion  Continue on Symbicort and Spiriva .   01/24/2014 f/u ov/Jeremiah [name] re:  GOLD III Chief Complaint  Patient presents with  . Follow-up    PFT done today. Pt states that SOB and cough have improved some, but not back to normal baseline. Cough is prod with minimal clear sputum.   can do HT but no mall due to sob Prednisone really helped a lot but did not maintain improvement once finished >>pred taper add ppi bid and diet     12/12/2014 acute  ov/Jeremiah [name] re: ? aecopd /new gi/gu complaints ? From spiriva?   Chief Complaint  Patient presents  with  . Acute Visit    Pt c/o increased SOB and cough x 4 days. Cough is not very prod- clear sputum. He also c/o increased gas and bloating.   downhill since prev ov, changing his own maint rx (dc'd pepcid) >  severe cough at hs onset 12/09/14  On spiriva lots of gi/gu issues  Started pred one day prior to OV  As per calendar action plan  Has only used saba so far in hfa form, has neb but doesn't feel he needs it yet.  >changed spiriva to [place]   01/03/2015 NP Follow up : COPD  Pt returns for 1 month follow up  Doing well on since last ov  Spiriva was changed to [place] last ov d/t urinary retention/freq.  Urination is better off spiriva  Reviewed all meds and organized them into a med calendar with pt education  Appears to be taking correctly  rec Continue on Symbicort and Tudorza  Follow med calendar closely and bring to each visit.     04/04/2015  f/u ov/Jeremiah [name] re:  Chief Complaint  Patient presents with  . Follow-up    pt following for COPD: pt states hes been its been on and off. pt states 3 weekws ago he was diagnosed with bronchitits but he feels much better. pt c/o a dry cough thats been bothering him mostly in the night time. pt states at times he has to take the CPAP off because he has these couhing fits. no c/o SOB , wheezing, or chest tightness.   has med calendar but not following the action plans at the bottom for cough   No obvious day to day or daytime variability or assoc excess/ purulent sputum or mucus plugs   or cp or chest tightness, subjective wheeze or overt sinus or hb symptoms. No unusual exp hx or h/o childhood pna/ asthma or knowledge of premature birth.    Also denies any obvious fluctuation of symptoms with weather or environmental changes or other aggravating or alleviating factors except as outlined above   Current Medications, Allergies, Complete Past Medical History, Past Surgical History, Family History, and Social History were reviewed in [employer] record.  ROS  The following are not active complaints unless bolded sore throat, dysphagia, dental problems, itching, sneezing,  nasal congestion or excess/ purulent secretions, ear ache,   fever, chills, sweats, unintended wt loss, classically pleuritic or exertional cp, hemoptysis,  orthopnea pnd or leg swelling, presyncope, palpitations, abdominal pain, anorexia, nausea, vomiting, diarrhea  or change in bowel or bladder habits, change in stools or urine, dysuria,hematuria,  rash, arthralgias, visual complaints, headache, numbness, weakness or ataxia or problems with walking or coordination,  change in mood/affect or memory.                       Objective:       Physical Exam  GEN: A/Ox3; pleasant , NAD, elderly , thin very anxious bm  nad   07/07/2014        188  > 07/20/2014     182 08/03/2014 >>    11/01/2014  181 > 12/12/2014   177 >182 01/03/2015 >  04/04/2015  183   Vital signs reviewed     HEENT:  [place]/AT,  EACs-clear, TMs-wnl, NOSE-clear, THROAT-clear, no lesions, no postnasal drip or exudate noted.  Top dentures   NECK:  Supple w/ fair ROM; no JVD; normal carotid impulses w/o bruits; no thyromegaly or nodules palpated; no lymphadenopathy.  RESP   bs are distant with no wheezing or crackles.  no accessory muscle use, no dullness to percussion  CARD:  RRR, no m/r/g  , no peripheral edema, pulses intact, no cyanosis or clubbing.  GI:   Soft & nt; nml bowel sounds; no organomegaly or masses detected.  Musco: Warm bil, no deformities or joint swelling noted.   Neuro: alert, no focal deficits noted.    Skin: Warm, no lesions or rashes       CXR PA and Lateral:   04/04/2015 :    I personally reviewed images and agree with radiology impression as follows:    COPD/chronic changes. No active disease.       Assessment:

## 2015-04-04 NOTE — Patient Instructions (Signed)
For cough/ congestion > mucinex dm and flutter valve   See calendar for specific medication instructions and bring it back for each and every office visit for every healthcare provider you see.  Without it,  you may not receive the best quality medical care that we feel you deserve.  You will note that the calendar groups together  your maintenance  medications that are timed at particular times of the day.  Think of this as your checklist for what your doctor has instructed you to do until your next evaluation to see what benefit  there is  to staying on a consistent group of medications intended to keep you well.  The other group at the bottom is entirely up to you to use as you see fit  for specific symptoms that may arise between visits that require you to treat them on an as needed basis.  Think of this as your action plan or "what if" list.   Separating the top medications from the bottom group is fundamental to providing you adequate care going forward.    See Tammy NP w/in 3(or first available)  weeks with all your medications, even over the counter meds, separated in two separate bags, the ones you take no matter what vs the ones you stop once you feel better and take only as needed when you feel you need them.   Tammy  will generate for you a new user friendly medication calendar that will put [place] all on the same page re: your medication use.     Without this process, it simply isn't possible to assure that we are providing  your outpatient care  with  the attention to detail we feel you deserve.   If we cannot assure that you're getting that kind of care,  then we cannot manage your problem effectively from this clinic.  Once you have seen Tammy and we are sure that we're all on the same page with your medication use she will arrange follow up with me.   Please remember to go to the   x-ray department downstairs for your tests - we will call you with the results when they are available.

## 2015-04-05 ENCOUNTER — Telehealth: Payer: Self-pay | Admitting: Internal Medicine

## 2015-04-05 MED ORDER — ALBUTEROL SULFATE HFA 108 (90 BASE) MCG/ACT IN AERS: 2.0000 | INHALATION_SPRAY | RESPIRATORY_TRACT | Status: DC | PRN

## 2015-04-05 NOTE — Telephone Encounter (Signed)
Spoke with pt. He is needing Proventil sent to his pharmacy. This has been done. Nothing further was needed.

## 2015-04-06 ENCOUNTER — Encounter (HOSPITAL_COMMUNITY): Payer: Self-pay

## 2015-04-11 ENCOUNTER — Encounter (HOSPITAL_COMMUNITY): Payer: Self-pay

## 2015-04-11 NOTE — Assessment & Plan Note (Addendum)
-   PFTs 01/24/2014  FEV1  1.30 (35%) ratio 49 with severe air trapping and no better p saba and dlco 48% -01/24/2014 p extensive coaching HFA effectiveness =    90%  - referred to rehab 01/24/14  -Med calendar 02/21/2014 > did not recognize it when reprinted 03/29/14 > reviewed , 08/03/2014  - 06/03/2014 p extensive coaching HFA effectiveness =    90%   - 07/07/14 rec short course prednisone for flares built into action plan  - d/c spiriva 12/12/2014 > trial of tudorza due to gi/gu side effects   Continues to struggle with use of med calendar which has specific action plans at the bottom and I am highly skeptical that he follows the top part either   I had an extended discussion with the patient reviewing all relevant studies completed to date and  lasting 15 to 20 minutes of a 25 minute visit    Each maintenance medication was reviewed in detail including most importantly the difference between maintenance and prns and under what circumstances the prns are to be triggered using an action plan format that is not reflected in the computer generated alphabetically organized AVS but trather by a customized med calendar that reflects the AVS meds with confirmed 100% correlation.   Please see instructions for details which were reviewed in writing and the patient given a copy highlighting the part that I personally wrote and discussed at today's ov.

## 2015-04-13 ENCOUNTER — Encounter (HOSPITAL_COMMUNITY): Payer: Self-pay

## 2015-04-18 ENCOUNTER — Encounter (HOSPITAL_COMMUNITY): Payer: Self-pay

## 2015-04-20 ENCOUNTER — Encounter (HOSPITAL_COMMUNITY): Payer: Self-pay

## 2015-04-25 ENCOUNTER — Encounter (HOSPITAL_COMMUNITY): Payer: Self-pay

## 2015-04-27 ENCOUNTER — Encounter (HOSPITAL_COMMUNITY): Payer: Self-pay

## 2015-04-28 ENCOUNTER — Ambulatory Visit (INDEPENDENT_AMBULATORY_CARE_PROVIDER_SITE_OTHER): Payer: Medicare Other | Admitting: Adult Health

## 2015-04-28 ENCOUNTER — Encounter: Payer: Self-pay | Admitting: Adult Health

## 2015-04-28 ENCOUNTER — Ambulatory Visit: Payer: Non-veteran care | Admitting: Adult Health

## 2015-04-28 VITALS — BP 124/82 | HR 66 | Temp 98.0°F | Ht 73.0 in | Wt 184.0 lb

## 2015-04-28 DIAGNOSIS — J449 Chronic obstructive pulmonary disease, unspecified: Secondary | ICD-10-CM

## 2015-04-28 NOTE — Patient Instructions (Signed)
Continue on Symbicort and Tudorza  Follow med calendar closely and bring to each visit.  Follow up Dr. Melvyn Novas  In 4 months and As needed   Please contact office for sooner follow up if symptoms do not improve or worsen or seek emergency care

## 2015-04-28 NOTE — Assessment & Plan Note (Signed)
Compensated on present regimen .  Patient's medications were reviewed today and patient education was given. Computerized medication calendar was adjusted/completed  Plan  Continue on Symbicort and Tudorza  Follow med calendar closely and bring to each visit.  Follow up Dr. Melvyn Novas  In 4 months and As needed   Please contact office for sooner follow up if symptoms do not improve or worsen or seek emergency care

## 2015-04-28 NOTE — Progress Notes (Signed)
Subjective:     Patient ID: Jeremiah Jeremiah [name], male   DOB: 1945/06/14   MRN: [id number]    Brief patient profile:  20 yobm quit smoking 1989 with no problems with variable sob x 2004 and maintained on inhalers referred 05/03/2013 by Dr Jeremiah Jeremiah [name] for evaluation of outpt management for copd after hospitalization and found Jeremiah [name] have GOLD III COPD 01/2014  with tendency Jeremiah [name] panic when sob.  Admit date: 04/24/2013  Discharge date: 04/27/2013  Discharge Diagnoses:  Active Problems:  COPD exacerbation  Detailed Hospital Course:  70 yo male with abrupt onset of sob (1/10) that progressively worsened until EMS called. Patient intubated in ED due Jeremiah [name] severe respiratory distress. In route, patient had increasing tachypnea, decline SpO2, and obtundation. Patient with GCS <8 at arrival, evidence of emesis, intubated for severe respiratory distress. Family reported no evidence of fevers, chills, malaise, worsening coughing, and sob. Initial episode passed, however sister received a call later with him reporting a return of symptoms, complaints of inability of breathing and EMS alerted. Distant history of tobacco abuse. Has never been hospitalized for COPD before this episode. He has had no recent hospitalizations, and has never been previously intubated.  He was admitted Jeremiah [name] the ICU on 1/11 and was treated with the typical modalities for acute respiratory failure (for presumed AECODP) which included mechanical ventilation with sedation, inhaled bronchodilators, antibiotics, and systemic steroid therapy. He improved later that day and was able Jeremiah [name] be extubated and sedation was stopped. His care since that point has primarily focused on the de-escalation of the therapies mentioned above. On 1/13 he has returned Jeremiah [name] a near-baseline state of health and has been deemed a candidate for discharge.  Discharge Plan by diagnoses  Acute exacerbation of COPD  Acute hypercarbic respiratory failure (resolved)  plan  -Follow up with Dr. Melvyn Jeremiah [name]  on 05/03/2013 at 2:30PM  -Change steroids Jeremiah [name] PO Prednisone, taper over 7-10d  -Resume home dose BDs    History of Present Illness  05/03/2013 [facility]st [name] visit/ Jeremiah [name] cc more sob since Oct 2014 on symbicort 160 2 bid and spiriva daily  But still  daily use of proventil and no need for neb then needed neb x sev months prior Jeremiah [name] his admit above but back Jeremiah [name] baseline since d/c on pred still tapering off but using saba qid, not prn "out of habit" >>return for PFT   01/03/2014 Follow up  [name] .  Complains of increased episodes of increased DOE, wheezing, prod cough with white/clear mucus worse for last week. Family member says he has been progressively get worse over last year. Was admitted 04/2013 w/ COPD flare /VDRF . Pt was Jeremiah [name] return for PFT from last office visit however did not follow up .   Followed at [name] , says he was told he has Moderate COPD 6 yrs ago. On Symbicort and Spiriva.  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  Says he gets winded easily esp on incline.  Says worse for last week with increased cough and wheezing.  No hemotpysis, wt loss, calf pain, n/v/d, chest pain or fever.  Last abx ~2 months ago.  rec Doxycycline 100mg  Twice daily  For 7 days  Prednisone taper over next week Mucinex DM Twice daily  As needed  Cough /congestion  Continue on Symbicort and Spiriva .   01/24/2014 f/u ov/Jeremiah [name] re:  GOLD III Chief Complaint  Patient presents with  . Follow-up    PFT done today. Pt states that SOB and cough have improved some, but not back Jeremiah [name] normal baseline. Cough is prod with minimal clear sputum.   can do HT but no mall due Jeremiah [name] sob Prednisone really helped a lot but did not maintain improvement once finished >>pred taper add ppi bid and diet     12/12/2014 acute  ov/Jeremiah [name] re: ? aecopd /new gi/gu complaints ? From spiriva?   Chief Complaint  Patient presents  with  . Acute Visit    Pt c/o increased SOB and cough x 4 days. Cough is not very prod- clear sputum. He also c/o increased gas and bloating.   downhill since prev ov, changing his own maint rx (dc'd pepcid) >  severe cough at hs onset 12/09/14  On spiriva lots of gi/gu issues  Started pred one day prior Jeremiah [name] OV  As per calendar action plan  Has only used saba so far in hfa form, has neb but doesn't feel he needs it yet.  >changed spiriva Jeremiah [name] Jeremiah Jeremiah [name]   01/03/2015 NP Follow up : COPD  Pt returns for 1 month follow up  Doing well on since last ov  Spiriva was changed Jeremiah [name] Jeremiah Jeremiah [name] last ov d/t urinary retention/freq.  Urination is better off spiriva  Reviewed all meds and organized them into a med calendar with pt education  Appears Jeremiah [name] be taking correctly  rec Continue on Symbicort and Tudorza  Follow med calendar closely and bring Jeremiah [name] each visit.     04/04/2015  f/u ov/Jeremiah [name] re:  Chief Complaint  Patient presents with  . Follow-up    pt following for COPD: pt states hes been its been on and off. pt states 3 weekws ago he was diagnosed with bronchitits but he feels much better. pt c/o a dry cough thats been bothering him mostly in the night time. pt states at times he has Jeremiah [name] take the CPAP off because he has these couhing fits. no c/o SOB , wheezing, or chest tightness.   has med calendar but not following the action plans at the bottom for cough  >>no changes   04/28/2015 Follow up : COPD GOLD III  Patient returns for a one-month follow-up for COPD Has an occasional dry cough, slight sinus drainage, SOB and wheezing at times overall feels his breathing is doing okay .  [name] Kitchen  Denies any chest congestion/tightness, fever, nausea or fever. Patient remains on Tudorza twice daily. Flu shot, Pneumovax and TDAP are  up-Jeremiah [name]-date Discussed Prevnar vaccine, wants Jeremiah [name] research this .  Patient denies any chest pain, orthopnea, PND, leg swelling, hemoptysis. Last chest x-ray December 2016 showed COPD  changes. We reviewed all his meds and organized them into med calendar with pt education    Current Medications, Allergies, Complete Past Medical History, Past Surgical History, Family History, and Social History were reviewed in [employer] record.  ROS  The following are not active complaints unless bolded sore throat, dysphagia, dental problems, itching, sneezing,  nasal congestion or excess/ purulent secretions, ear ache,   fever, chills, sweats, unintended wt loss, classically pleuritic or exertional cp, hemoptysis,  orthopnea pnd or leg swelling, presyncope, palpitations, abdominal pain, anorexia, nausea, vomiting, diarrhea  or change in bowel or bladder habits, change in stools or urine, dysuria,hematuria,  rash, arthralgias, visual complaints, headache, numbness, weakness or ataxia or problems with walking or coordination,  change in mood/affect or memory.  Objective:       Physical Exam  GEN: A/Ox3; pleasant , NAD, elderly , thin very anxious bm nad   07/07/2014        188  > 07/20/2014     182 08/03/2014 >>    11/01/2014  181 > 12/12/2014   177 >182 01/03/2015 >  04/04/2015  183 >04/28/2015 184  Vital signs reviewed     HEENT:  [place]/AT,  EACs-clear, TMs-wnl, NOSE-clear, THROAT-clear, no lesions, no postnasal drip or exudate noted.  Top dentures   NECK:  Supple w/ fair ROM; no JVD; normal carotid impulses w/o bruits; no thyromegaly or nodules palpated; no lymphadenopathy.  RESP   bs are distant with no wheezing or crackles.  no accessory muscle use, no dullness Jeremiah [name] percussion  CARD:  RRR, no m/r/g  , no peripheral edema, pulses intact, no cyanosis or clubbing.  GI:   Soft & nt; nml bowel sounds; no organomegaly or masses detected.  Musco: Warm bil, no deformities or joint swelling noted.   Neuro: alert, no focal deficits noted.    Skin: Warm, no lesions or rashes       CXR PA and Lateral:   04/04/2015 :      COPD/chronic  changes. No active disease.       Assessment:

## 2015-04-29 NOTE — Progress Notes (Signed)
Chart and office note reviewed in detail  > agree with a/p as outlined    

## 2015-05-02 ENCOUNTER — Encounter (HOSPITAL_COMMUNITY): Payer: Self-pay

## 2015-05-02 NOTE — Addendum Note (Signed)
Addended by: Osa Craver on: 05/02/2015 10:34 AM   Modules accepted: Orders, Medications

## 2015-05-04 ENCOUNTER — Encounter (HOSPITAL_COMMUNITY): Payer: Self-pay

## 2015-05-09 ENCOUNTER — Encounter (HOSPITAL_COMMUNITY): Payer: Self-pay

## 2015-05-11 ENCOUNTER — Encounter (HOSPITAL_COMMUNITY): Payer: Self-pay

## 2015-05-16 ENCOUNTER — Encounter (HOSPITAL_COMMUNITY): Payer: Self-pay

## 2015-05-18 ENCOUNTER — Encounter (HOSPITAL_COMMUNITY): Payer: Self-pay

## 2015-05-23 ENCOUNTER — Encounter (HOSPITAL_COMMUNITY): Payer: Self-pay

## 2015-05-25 ENCOUNTER — Encounter (HOSPITAL_COMMUNITY): Payer: Self-pay

## 2015-05-30 ENCOUNTER — Encounter (HOSPITAL_COMMUNITY): Payer: Self-pay

## 2015-06-01 ENCOUNTER — Encounter (HOSPITAL_COMMUNITY): Payer: Self-pay

## 2015-06-06 ENCOUNTER — Encounter (HOSPITAL_COMMUNITY): Payer: Self-pay

## 2015-06-08 ENCOUNTER — Encounter (HOSPITAL_COMMUNITY): Payer: Self-pay

## 2015-06-13 ENCOUNTER — Encounter (HOSPITAL_COMMUNITY): Payer: Self-pay

## 2015-06-16 ENCOUNTER — Other Ambulatory Visit: Payer: Self-pay | Admitting: Internal Medicine

## 2015-07-03 DIAGNOSIS — D2371 Other benign neoplasm of skin of right lower limb, including hip: Secondary | ICD-10-CM | POA: Diagnosis not present

## 2015-07-03 DIAGNOSIS — M2042 Other hammer toe(s) (acquired), left foot: Secondary | ICD-10-CM | POA: Diagnosis not present

## 2015-07-03 DIAGNOSIS — M2041 Other hammer toe(s) (acquired), right foot: Secondary | ICD-10-CM | POA: Diagnosis not present

## 2015-07-03 DIAGNOSIS — M79675 Pain in left toe(s): Secondary | ICD-10-CM | POA: Diagnosis not present

## 2015-07-03 DIAGNOSIS — M79674 Pain in right toe(s): Secondary | ICD-10-CM | POA: Diagnosis not present

## 2015-07-03 DIAGNOSIS — D2372 Other benign neoplasm of skin of left lower limb, including hip: Secondary | ICD-10-CM | POA: Diagnosis not present

## 2015-07-15 HISTORY — PX: HAMMER TOE SURGERY: SHX385

## 2015-07-17 DIAGNOSIS — Z79899 Other long term (current) drug therapy: Secondary | ICD-10-CM | POA: Diagnosis not present

## 2015-07-17 DIAGNOSIS — M2041 Other hammer toe(s) (acquired), right foot: Secondary | ICD-10-CM | POA: Diagnosis not present

## 2015-07-20 ENCOUNTER — Encounter: Payer: Self-pay | Admitting: Internal Medicine

## 2015-07-20 ENCOUNTER — Ambulatory Visit (INDEPENDENT_AMBULATORY_CARE_PROVIDER_SITE_OTHER): Payer: Medicare Other | Admitting: Internal Medicine

## 2015-07-20 DIAGNOSIS — J449 Chronic obstructive pulmonary disease, unspecified: Secondary | ICD-10-CM | POA: Diagnosis not present

## 2015-07-20 MED ORDER — PREDNISONE 10 MG PO TABS: ORAL_TABLET | ORAL | Status: DC

## 2015-07-20 NOTE — Progress Notes (Signed)
Subjective:     Patient ID: Jeremiah [name], male   DOB: 12-18-1945   MRN: [id number]    Brief patient profile:  35 yobm quit smoking 1989 with no problems with variable sob x 2004 and maintained on inhalers referred 05/03/2013 by Dr Titus Mould for evaluation of outpt management for copd after hospitalization and found to have GOLD III COPD 01/2014  with tendency to panic when sob.  Admit date: 04/24/2013  Discharge date: 04/27/2013  Discharge Diagnoses:  Active Problems:  COPD exacerbation  Detailed Hospital Course:  70 yo male with abrupt onset of sob (1/10) that progressively worsened until EMS called. Patient intubated in ED due to severe respiratory distress. In route, patient had increasing tachypnea, decline SpO2, and obtundation. Patient with GCS <8 at arrival, evidence of emesis, intubated for severe respiratory distress. Family reported no evidence of fevers, chills, malaise, worsening coughing, and sob. Initial episode passed, however sister received a call later with him reporting a return of symptoms, complaints of inability of breathing and EMS alerted. Distant history of tobacco abuse. Has never been hospitalized for COPD before this episode. He has had no recent hospitalizations, and has never been previously intubated.  He was admitted to the ICU on 1/11 and was treated with the typical modalities for acute respiratory failure (for presumed AECODP) which included mechanical ventilation with sedation, inhaled bronchodilators, antibiotics, and systemic steroid therapy. He improved later that day and was able to be extubated and sedation was stopped. His care since that point has primarily focused on the de-escalation of the therapies mentioned above. On 1/13 he has returned to a near-baseline state of health and has been deemed a candidate for discharge.  Discharge Plan by diagnoses  Acute exacerbation of COPD  Acute hypercarbic respiratory failure (resolved)  plan  -Follow up with Dr. Melvyn Novas  on 05/03/2013 at 2:30PM  -Change steroids to PO Prednisone, taper over 7-10d  -Resume home dose BDs    History of Present Illness  05/03/2013 1st [facility] office visit/ Sibley Rolison cc more sob since Oct 2014 on symbicort 160 2 bid and spiriva daily  But still  daily use of proventil and no need for neb then needed neb x sev months prior to his admit above but back to baseline since d/c on pred still tapering off but using saba qid, not prn "out of habit" >>return for PFT   01/03/2014 Follow up  [name] .  Complains of increased episodes of increased DOE, wheezing, prod cough with white/clear mucus worse for last week. Family member says he has been progressively get worse over last year. Was admitted 04/2013 w/ COPD flare /VDRF . [name] from last office visit however did not follow up .   Followed at [facility] , says he was told he has Moderate COPD 6 yrs ago. On Symbicort and Spiriva.  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  Says he gets winded easily esp on incline.  Says worse for last week with increased cough and wheezing.  No hemotpysis, wt loss, calf pain, n/v/d, chest pain or fever.  Last abx ~2 months ago.  rec Doxycycline 100mg  Twice daily  For 7 days  Prednisone taper over next week Mucinex DM Twice daily  As needed  Cough /congestion  Continue on Symbicort and Spiriva .   01/24/2014 f/u ov/Mahrosh Donnell re:  GOLD III Chief Complaint  Patient presents with  . Follow-up    PFT done today. Pt states that SOB and cough have improved some, but not back to normal baseline. Cough is prod with minimal clear sputum.   can do HT but no mall due to sob Prednisone really helped a lot but did not maintain improvement once finished >>pred taper add ppi bid and diet     12/12/2014 acute  ov/Cloyd Ragas re: ? aecopd /new gi/gu complaints ? From spiriva?   Chief Complaint  Patient presents  with  . Acute Visit    Pt c/o increased SOB and cough x 4 days. Cough is not very prod- clear sputum. He also c/o increased gas and bloating.   downhill since prev ov, changing his own maint rx (dc'd pepcid) >  severe cough at hs onset 12/09/14  On spiriva lots of gi/gu issues  Started pred one day prior to OV  As per calendar action plan  Has only used saba so far in hfa form, has neb but doesn't feel he needs it yet.  >changed spiriva to [place]     04/04/2015  f/u ov/Karrine Kluttz re: copd  Chief Complaint  Patient presents with  . Follow-up    pt following for COPD: pt states hes been its been on and off. pt states 3 weekws ago he was diagnosed with bronchitits but he feels much better. pt c/o a dry cough thats been bothering him mostly in the night time. pt states at times he has to take the CPAP off because he has these couhing fits. no c/o SOB , wheezing, or chest tightness.   has med calendar but not following the action plans at the bottom for cough  > For cough/ congestion > mucinex dm and flutter valve       07/20/2015  f/u ov/Alfreda Hammad re: aecopd/ using med calendar though not flutter or approp dose of mucinex dm/ confused with when to use pred  Chief Complaint  Patient presents with  . Follow-up    pt c/o nonprod cough, chest congestion, sob X4-5 days.  S/s worse qhs.    last prednisone finished on 07/16/15 and getting worse again in terms of breathing and dry coughing    No obvious day to day or daytime variability or assoc excess/ purulent sputum or mucus plugs or hemoptysis or cp or chest tightness, subjective wheeze or overt sinus or hb symptoms. No unusual exp hx or h/o childhood pna/ asthma or knowledge of premature birth.  Sleeping ok without nocturnal  or early am exacerbation  of respiratory  c/o's or need for noct saba. Also denies any obvious fluctuation of symptoms with weather or environmental changes or other aggravating or alleviating factors except as outlined above    Current Medications, Allergies, Complete Past Medical History, Past Surgical History, Family History, and Social History were reviewed in [employer] record.  ROS  The following are not active complaints unless bolded sore throat, dysphagia, dental problems, itching, sneezing,  nasal congestion or excess/ purulent secretions, ear ache,   fever, chills, sweats, unintended wt loss, classically pleuritic or exertional cp,  orthopnea pnd or leg swelling, presyncope, palpitations, abdominal pain, anorexia, nausea, vomiting, diarrhea  or change in bowel or bladder habits, change in stools or urine, dysuria,hematuria,  rash, arthralgias, visual complaints, headache, numbness, weakness or ataxia or problems with walking or coordination,  change in mood/affect or memory.  Objective:       Physical Exam  GEN: A/Ox3; pleasant , NAD, elderly , thin very anxious bm nad pseudowheeze resolves with plm   07/07/2014        188  > 07/20/2014     182 08/03/2014 >>    11/01/2014  181 > 12/12/2014   177 >182 01/03/2015 >  04/04/2015  183 >04/28/2015 184  Wt Readings from Last 3 Encounters:  07/20/15 184 lb (83.462 kg)  04/28/15 184 lb (83.462 kg)  04/04/15 182 lb (82.555 kg)    Vital signs reviewed   HEENT:  [place]/AT,  EACs-clear, TMs-wnl, NOSE-clear, THROAT-clear, no lesions, no postnasal drip or exudate noted.  Top dentures   NECK:  Supple w/ fair ROM; no JVD; normal carotid impulses w/o bruits; no thyromegaly or nodules palpated; no lymphadenopathy.  RESP   bs are distant with no wheezing or crackles.  no accessory muscle use, no dullness to percussion  CARD:  RRR, no m/r/g  , no peripheral edema, pulses intact, no cyanosis or clubbing.  GI:   Soft & nt; nml bowel sounds; no organomegaly or masses detected.  Musco: Warm bil, no deformities or joint swelling noted.   Neuro: alert, no focal deficits noted.    Skin: Warm, no lesions or  rashes     I personally reviewed images and agree with radiology impression as follows:  CXR:  04/04/15 COPD/chronic changes. No active disease.          Assessment:

## 2015-07-20 NOTE — Patient Instructions (Signed)
In the event that you need your nebulizer more than rarely for your breathing: prednisone 20 mg daily until better  Then 10 mg daily x 5 days and stop  For cough > mucinex dm 1200 mg every 12 hours and use the flutter valve  Work on inhaler technique:  relax and gently blow all the way out then take a nice smooth deep breath back in, triggering the inhaler at same time you start breathing in.  Hold for up to 5 seconds if you can. Blow out thru nose. Rinse and gargle with water when done  See calendar for specific medication instructions and bring it back for each and every office visit for every healthcare provider you see.  Without it,  you may not receive the best quality medical care that we feel you deserve.  You will note that the calendar groups together  your maintenance  medications that are timed at particular times of the day.  Think of this as your checklist for what your doctor has instructed you to do until your next evaluation to see what benefit  there is  to staying on a consistent group of medications intended to keep you well.  The other group at the bottom is entirely up to you to use as you see fit  for specific symptoms that may arise between visits that require you to treat them on an as needed basis.  Think of this as your action plan or "what if" list.   Separating the top medications from the bottom group is fundamental to providing you adequate care going forward.    Keep previous appt

## 2015-07-21 NOTE — Assessment & Plan Note (Signed)
-   PFTs 01/24/2014  FEV1  1.30 (35%) ratio 49 with severe air trapping and no better p saba and dlco 48% -01/24/2014 p extensive coaching HFA effectiveness =    90%  - referred to rehab 01/24/14  -Med calendar 02/21/2014 > did not recognize it when reprinted 03/29/14 > reviewed , 08/03/2014 , 04/28/2015   - 07/07/14 rec short course prednisone for flares built into action plan  - d/c spiriva 12/12/2014 > trial of tudorza due to gi/gu side effects   - 07/20/2015  p extensive coaching HFA effectiveness =    90% from a baseline 75%   Starting to flare again off prednisone partly due to suboptimal use of symbicort/ reviewed  The goal with a chronic steroid dependent illness is always arriving at the lowest effective dose that controls the disease/symptoms and not accepting a set "formula" which is based on statistics or guidelines that don't always take into account patient  variability or the natural hx of the dz in every individual patient, which may well vary over time.  For now therefore I recommend the patient trigger the pred at 20 mg daily whenever having to use neb more than occasionally and continue pred 20 until better, no need for neb then 10 mg x 5 days and off and if this is not effective change the floor to 5 mg daily   I had an extended discussion with the patient/wife  reviewing all relevant studies completed to date and  lasting 15 to 20 minutes of a 25 minute visit    Each maintenance medication was reviewed in detail including most importantly the difference between maintenance and prns and under what circumstances the prns are to be triggered using an action plan format that is not reflected in the computer generated alphabetically organized AVS but trather by a customized med calendar that reflects the AVS meds with confirmed 100% correlation.   Please see instructions for details which were reviewed in writing and the patient given a copy highlighting the part that I personally wrote and  discussed at today's ov.

## 2015-08-01 DIAGNOSIS — M2041 Other hammer toe(s) (acquired), right foot: Secondary | ICD-10-CM | POA: Diagnosis not present

## 2015-08-04 DIAGNOSIS — M7989 Other specified soft tissue disorders: Secondary | ICD-10-CM | POA: Diagnosis not present

## 2015-08-14 DIAGNOSIS — M25571 Pain in right ankle and joints of right foot: Secondary | ICD-10-CM | POA: Diagnosis not present

## 2015-08-28 ENCOUNTER — Ambulatory Visit: Payer: Non-veteran care | Admitting: Internal Medicine

## 2015-08-29 ENCOUNTER — Ambulatory Visit (INDEPENDENT_AMBULATORY_CARE_PROVIDER_SITE_OTHER): Payer: Medicare Other | Admitting: Internal Medicine

## 2015-08-29 ENCOUNTER — Encounter: Payer: Self-pay | Admitting: Internal Medicine

## 2015-08-29 VITALS — BP 120/78 | HR 72 | Ht 73.5 in | Wt 181.0 lb

## 2015-08-29 DIAGNOSIS — J449 Chronic obstructive pulmonary disease, unspecified: Secondary | ICD-10-CM | POA: Diagnosis not present

## 2015-08-29 MED ORDER — TIOTROPIUM BROMIDE MONOHYDRATE 2.5 MCG/ACT IN AERS: INHALATION_SPRAY | RESPIRATORY_TRACT | Status: DC

## 2015-08-29 NOTE — Progress Notes (Signed)
Subjective:     Patient ID: Jeremiah [name], male   DOB: 21-Dec-1945   MRN: [id number]    Brief patient profile:  25 yobm quit smoking 1989 with no problems with variable sob x 2004 and maintained on inhalers referred 05/03/2013 by Dr Titus Mould for evaluation of outpt management for copd after hospitalization and found to have GOLD III COPD 01/2014  with tendency to panic when sob.  Admit date: 04/24/2013  Discharge date: 04/27/2013  Discharge Diagnoses:  Active Problems:  COPD exacerbation  Detailed Hospital Course:  70 yo male with abrupt onset of sob (1/10) that progressively worsened until EMS called. Patient intubated in ED due to severe respiratory distress. In route, patient had increasing tachypnea, decline SpO2, and obtundation. Patient with GCS <8 at arrival, evidence of emesis, intubated for severe respiratory distress. Family reported no evidence of fevers, chills, malaise, worsening coughing, and sob. Initial episode passed, however sister received a call later with him reporting a return of symptoms, complaints of inability of breathing and EMS alerted. Distant history of tobacco abuse. Has never been hospitalized for COPD before this episode. He has had no recent hospitalizations, and has never been previously intubated.  He was admitted to the ICU on 1/11 and was treated with the typical modalities for acute respiratory failure (for presumed AECODP) which included mechanical ventilation with sedation, inhaled bronchodilators, antibiotics, and systemic steroid therapy. He improved later that day and was able to be extubated and sedation was stopped. His care since that point has primarily focused on the de-escalation of the therapies mentioned above. On 1/13 he has returned to a near-baseline state of health and has been deemed a candidate for discharge.  Discharge Plan by diagnoses  Acute exacerbation of COPD  Acute hypercarbic respiratory failure (resolved)  plan  -Follow up with Dr. Melvyn Novas  on 05/03/2013 at 2:30PM  -Change steroids to PO Prednisone, taper over 7-10d  -Resume home dose BDs    History of Present Illness  05/03/2013 1st [facility] office visit/ Dakwon Wenberg cc more sob since Oct 2014 on symbicort 160 2 bid and spiriva daily  But still  daily use of proventil and no need for neb then needed neb x sev months prior to his admit above but back to baseline since d/c on pred still tapering off but using saba qid, not prn "out of habit" >>return for PFT   01/03/2014 Follow up  [name] .  Complains of increased episodes of increased DOE, wheezing, prod cough with white/clear mucus worse for last week. Family member says he has been progressively get worse over last year. Was admitted 04/2013 w/ COPD flare /VDRF . [name] from last office visit however did not follow up .   Followed at [facility] , says he was told he has Moderate COPD 6 yrs ago. On Symbicort and Spiriva.  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  Says he gets winded easily esp on incline.  Says worse for last week with increased cough and wheezing.  No hemotpysis, wt loss, calf pain, n/v/d, chest pain or fever.  Last abx ~2 months ago.  rec Doxycycline 100mg  Twice daily  For 7 days  Prednisone taper over next week Mucinex DM Twice daily  As needed  Cough /congestion  Continue on Symbicort and Spiriva .    01/24/2014 f/u ov/Janara Klett re:  GOLD III Chief  Complaint  Patient presents with  . Follow-up    PFT done today. Pt states that SOB and cough have improved some, but not back to normal baseline. Cough is prod with minimal clear sputum.   can do HT but no mall due to sob Prednisone really helped a lot but did not maintain improvement once finished >>pred taper add ppi bid and diet     12/12/2014 acute  ov/Kamran Coker re: ? aecopd /new gi/gu complaints ? From spiriva?   Chief Complaint  Patient presents  with  . Acute Visit    Pt c/o increased SOB and cough x 4 days. Cough is not very prod- clear sputum. He also c/o increased gas and bloating.   downhill since prev ov, changing his own maint rx (dc'd pepcid) >  severe cough at hs onset 12/09/14  On spiriva lots of gi/gu issues  Started pred one day prior to OV  As per calendar action plan  Has only used saba so far in hfa form, has neb but doesn't feel he needs it yet.  >changed spiriva to [place]     04/04/2015  f/u ov/Raaga Maeder re: copd  Chief Complaint  Patient presents with  . Follow-up    pt following for COPD: pt states hes been its been on and off. pt states 3 weekws ago he was diagnosed with bronchitits but he feels much better. pt c/o a dry cough thats been bothering him mostly in the night time. pt states at times he has to take the CPAP off because he has these couhing fits. no c/o SOB , wheezing, or chest tightness.   has med calendar but not following the action plans at the bottom for cough  > For cough/ congestion > mucinex dm and flutter valve       07/20/2015  f/u ov/Erine Phenix re: aecopd/ using med calendar though not flutter or approp dose of mucinex dm/ confused with when to use pred  Chief Complaint  Patient presents with  . Follow-up    pt c/o nonprod cough, chest congestion, sob X4-5 days.  S/s worse qhs.    last prednisone finished on 07/16/15 and getting worse again in terms of breathing and dry coughing rec In the event that you need your nebulizer more than rarely for your breathing: prednisone 20 mg daily until better  Then 10 mg daily x 5 days and stop For cough > mucinex dm 1200 mg every 12 hours and use the flutter valve Work on inhaler technique    08/29/2015  f/u ov/Danthony Kendrix re:  GOLD III  symbicort 160 2bid/ tudorza  Chief Complaint  Patient presents with  . Follow-up    Cough has not improved since the last visit. He uses ventolin at least once per day and neb with albuterol 2 x daily on average.   Still using  oil based vit D, following med calendar better / rarely taking prednisone but helps when does  Lots of hoarseness and dry cough  Day > nocgt   No obvious day to day or daytime variability or assoc excess/ purulent sputum or mucus plugs or hemoptysis or cp or chest tightness, subjective wheeze or overt sinus or hb symptoms. No unusual exp hx or h/o childhood pna/ asthma or knowledge of premature birth.  Sleeping ok without nocturnal  or early am exacerbation  of respiratory  c/o's or need for noct saba. Also denies any obvious fluctuation of symptoms with weather or environmental changes or other aggravating or alleviating  factors except as outlined above   Current Medications, Allergies, Complete Past Medical History, Past Surgical History, Family History, and Social History were reviewed in [employer] record.  ROS  The following are not active complaints unless bolded sore throat, dysphagia, dental problems, itching, sneezing,  nasal congestion or excess/ purulent secretions, ear ache,   fever, chills, sweats, unintended wt loss, classically pleuritic or exertional cp,  orthopnea pnd or leg swelling, presyncope, palpitations, abdominal pain, anorexia, nausea, vomiting, diarrhea  or change in bowel or bladder habits, change in stools or urine, dysuria,hematuria,  rash, arthralgias, visual complaints, headache, numbness, weakness or ataxia or problems with walking or coordination,  change in mood/affect or memory.                   Objective:       Physical Exam  GEN: A/Ox3; pleasant , NAD, elderly , thin very anxious bm nad pseudowheeze resolves with plm   07/07/2014        188  > 07/20/2014     182 08/03/2014 >>    11/01/2014  181 > 12/12/2014   177 >182 01/03/2015 >  04/04/2015  183 >04/28/2015 184 > 08/29/2015  181    Vital signs reviewed      HEENT:  [place]/AT,  EACs-clear, TMs-wnl, NOSE-clear, THROAT-clear, no lesions, no postnasal drip or exudate noted.  Top dentures    NECK:  Supple w/ fair ROM; no JVD; normal carotid impulses w/o bruits; no thyromegaly or nodules palpated; no lymphadenopathy.  RESP   bs are distant with no wheezing or crackles.  no accessory muscle use, no dullness to percussion  CARD:  RRR, no m/r/g  , no peripheral edema, pulses intact, no cyanosis or clubbing.  GI:   Soft & nt; nml bowel sounds; no organomegaly or masses detected.  Musco: Warm bil, no deformities or joint swelling noted.   Neuro: alert, no focal deficits noted.    Skin: Warm, no lesions or rashes               Assessment:

## 2015-08-29 NOTE — Patient Instructions (Addendum)
Stop tudorza and start spiriva respimat 2 pffs each only instead   No oil based vitamins  See calendar for specific medication instructions and bring it back for each and every office visit for every healthcare provider you see.  Without it,  you may not receive the best quality medical care that we feel you deserve.  You will note that the calendar groups together  your maintenance  medications that are timed at particular times of the day.  Think of this as your checklist for what your doctor has instructed you to do until your next evaluation to see what benefit  there is  to staying on a consistent group of medications intended to keep you well.  The other group at the bottom is entirely up to you to use as you see fit  for specific symptoms that may arise between visits that require you to treat them on an as needed basis.  Think of this as your action plan or "what if" list.   Separating the top medications from the bottom group is fundamental to providing you adequate care going forward.    See Tammy NP w/in 2 weeks with all your medications, even over the counter meds, separated in two separate bags, the ones you take no matter what vs the ones you stop once you feel better and take only as needed when you feel you need them.   Tammy  will generate for you a new user friendly medication calendar that will put [place] all on the same page re: your medication use and set you up for follow up with me

## 2015-08-31 NOTE — Assessment & Plan Note (Addendum)
-   PFTs 01/24/2014  FEV1  1.30 (35%) ratio 49 with severe air trapping and no better p saba and dlco 48% -01/24/2014 p extensive coaching HFA effectiveness =    90%  - referred to rehab 01/24/14  -Med calendar 02/21/2014 > did not recognize it when reprinted 03/29/14 > reviewed , 08/03/2014 , 04/28/2015   - 07/07/14 rec short course prednisone for flares built into action plan  - d/c spiriva 12/12/2014 > trial of tudorza due to gi/gu side effects - 07/20/2015  p extensive coaching HFA effectiveness =    90% from a baseline 75%  - 07/20/2015 changed prednisone to 20 mg per day until better, then taper off with trigger by need for neb  Not optimally controlled with freq need for pred and nebs with lots of dry coughing so rec try change back to lama in respimat form this time  08/29/2015  After extensive coaching HFA effectiveness =    90%  I had an extended discussion with the patient reviewing all relevant studies completed to date and  lasting 15 to 20 minutes of a 25 minute visit    Formulary restrictions will be an ongoing challenge for the forseable future and I would be happy to pick an alternative if the pt will first  provide me a list of them but pt  will need to return here for training for any new device that is required eg dpi vs hfa vs respimat.    In meantime we can always provide samples so the patient never runs out of any needed respiratory medications.   Each maintenance medication was reviewed in detail including most importantly the difference between maintenance and prns and under what circumstances the prns are to be triggered using an action plan format that is not reflected in the computer generated alphabetically organized AVS but trather by a customized med calendar that reflects the AVS meds with confirmed 100% correlation.   Please see instructions for details which were reviewed in writing and the patient given a copy highlighting the part that I personally wrote and discussed at  today's ov.

## 2015-09-12 ENCOUNTER — Encounter: Payer: Self-pay | Admitting: Adult Health

## 2015-09-12 ENCOUNTER — Ambulatory Visit (INDEPENDENT_AMBULATORY_CARE_PROVIDER_SITE_OTHER): Payer: Medicare Other | Admitting: Adult Health

## 2015-09-12 VITALS — BP 130/72 | HR 59 | Temp 97.5°F | Ht 73.0 in | Wt 185.0 lb

## 2015-09-12 DIAGNOSIS — J449 Chronic obstructive pulmonary disease, unspecified: Secondary | ICD-10-CM | POA: Diagnosis not present

## 2015-09-12 MED ORDER — NYSTATIN 100000 UNIT/ML MT SUSP: 5.0000 mL | Freq: Four times a day (QID) | OROMUCOSAL | Status: DC

## 2015-09-12 NOTE — Progress Notes (Signed)
Subjective:     Patient ID: Jeremiah [name], male   DOB: 1945/06/27   MRN: [id number]    Brief patient profile:  37 yobm quit smoking 1989 with no problems with variable sob x 2004 and maintained on inhalers referred 05/03/2013 by Dr Titus Mould for evaluation of outpt management for copd after hospitalization and found to have GOLD III COPD 01/2014  with tendency to panic when sob.  Admit date: 04/24/2013  Discharge date: 04/27/2013  Discharge Diagnoses:  Active Problems:  COPD exacerbation  Detailed Hospital Course:  70 yo male with abrupt onset of sob (1/10) that progressively worsened until EMS called. Patient intubated in ED due to severe respiratory distress. In route, patient had increasing tachypnea, decline SpO2, and obtundation. Patient with GCS <8 at arrival, evidence of emesis, intubated for severe respiratory distress. Family reported no evidence of fevers, chills, malaise, worsening coughing, and sob. Initial episode passed, however sister received a call later with him reporting a return of symptoms, complaints of inability of breathing and EMS alerted. Distant history of tobacco abuse. Has never been hospitalized for COPD before this episode. He has had no recent hospitalizations, and has never been previously intubated.  He was admitted to the ICU on 1/11 and was treated with the typical modalities for acute respiratory failure (for presumed AECODP) which included mechanical ventilation with sedation, inhaled bronchodilators, antibiotics, and systemic steroid therapy. He improved later that day and was able to be extubated and sedation was stopped. His care since that point has primarily focused on the de-escalation of the therapies mentioned above. On 1/13 he has returned to a near-baseline state of health and has been deemed a candidate for discharge.  Discharge Plan by diagnoses  Acute exacerbation of COPD  Acute hypercarbic respiratory failure (resolved)  plan  -Follow up with Dr. Melvyn Novas  on 05/03/2013 at 2:30PM  -Change steroids to PO Prednisone, taper over 7-10d  -Resume home dose BDs    History of Present Illness  05/03/2013 1st [facility] office visit/ Wert cc more sob since Oct 2014 on symbicort 160 2 bid and spiriva daily  But still  daily use of proventil and no need for neb then needed neb x sev months prior to his admit above but back to baseline since d/c on pred still tapering off but using saba qid, not prn "out of habit" >>return for PFT   01/03/2014 Follow up  [name] .  Complains of increased episodes of increased DOE, wheezing, prod cough with white/clear mucus worse for last week. Family member says he has been progressively get worse over last year. Was admitted 04/2013 w/ COPD flare /VDRF . [name] from last office visit however did not follow up .   Followed at [facility] ([facility] , says he was told he has Moderate COPD 6 yrs ago. On Symbicort and Spiriva.  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  Says he gets winded easily esp on incline.  Says worse for last week with increased cough and wheezing.  No hemotpysis, wt loss, calf pain, n/v/d, chest pain or fever.  Last abx ~2 months ago.  rec Doxycycline 100mg  Twice daily  For 7 days  Prednisone taper over next week Mucinex DM Twice daily  As needed  Cough /congestion  Continue on Symbicort and Spiriva .    01/24/2014 f/u ov/Wert re:  GOLD III Chief  Complaint  Patient presents with  . Follow-up    PFT done today. Pt states that SOB and cough have improved some, but not back to normal baseline. Cough is prod with minimal clear sputum.   can do HT but no mall due to sob Prednisone really helped a lot but did not maintain improvement once finished >>pred taper add ppi bid and diet     12/12/2014 acute  ov/Wert re: ? aecopd /new gi/gu complaints ? From spiriva?   Chief Complaint  Patient presents  with  . Acute Visit    Pt c/o increased SOB and cough x 4 days. Cough is not very prod- clear sputum. He also c/o increased gas and bloating.   downhill since prev ov, changing his own maint rx (dc'd pepcid) >  severe cough at hs onset 12/09/14  On spiriva lots of gi/gu issues  Started pred one day prior to OV  As per calendar action plan  Has only used saba so far in hfa form, has neb but doesn't feel he needs it yet.  >changed spiriva to [place]     04/04/2015  f/u ov/Wert re: copd  Chief Complaint  Patient presents with  . Follow-up    pt following for COPD: pt states hes been its been on and off. pt states 3 weekws ago he was diagnosed with bronchitits but he feels much better. pt c/o a dry cough thats been bothering him mostly in the night time. pt states at times he has to take the CPAP off because he has these couhing fits. no c/o SOB , wheezing, or chest tightness.   has med calendar but not following the action plans at the bottom for cough  > For cough/ congestion > mucinex dm and flutter valve       07/20/2015  f/u ov/Wert re: aecopd/ using med calendar though not flutter or approp dose of mucinex dm/ confused with when to use pred  Chief Complaint  Patient presents with  . Follow-up    pt c/o nonprod cough, chest congestion, sob X4-5 days.  S/s worse qhs.    last prednisone finished on 07/16/15 and getting worse again in terms of breathing and dry coughing rec In the event that you need your nebulizer more than rarely for your breathing: prednisone 20 mg daily until better  Then 10 mg daily x 5 days and stop For cough > mucinex dm 1200 mg every 12 hours and use the flutter valve Work on inhaler technique    08/29/2015  f/u ov/Wert re:  GOLD III  symbicort 160 2bid/ tudorza  Chief Complaint  Patient presents with  . Follow-up    Cough has not improved since the last visit. He uses ventolin at least once per day and neb with albuterol 2 x daily on average.   Still using  oil based vit D, following med calendar better / rarely taking prednisone but helps when does  Lots of hoarseness and dry cough  Day >   >stop [place] and begin Spiriva   09/12/2015 follow up : COPD GOLD III , Med review  Pt returns for a 2 week follow up . Patient says that he is doing well except he still has a dry cough. He denies discolored mucus, fever, chest pain, orthopnea, PND, or increased leg swelling. We reviewed all his medications organize them into a medication count with patient education Appears he is taking correctly PVX , TDAP , Prevnar 13  Are utd.  Like Spiriva a lot better, breathing is doing well.  Recently got dentures and feels that he has some irritation along gums and inside of upper lip.    Current Medications, Allergies, Complete Past Medical History, Past Surgical History, Family History, and Social History were reviewed in [employer] record.  ROS  The following are not active complaints unless bolded sore throat, dysphagia, dental problems, itching, sneezing,  nasal congestion or excess/ purulent secretions, ear ache,   fever, chills, sweats, unintended wt loss, classically pleuritic or exertional cp,  orthopnea pnd or leg swelling, presyncope, palpitations, abdominal pain, anorexia, nausea, vomiting, diarrhea  or change in bowel or bladder habits, change in stools or urine, dysuria,hematuria,  rash, arthralgias, visual complaints, headache, numbness, weakness or ataxia or problems with walking or coordination,  change in mood/affect or memory.                   Objective:       Physical Exam  GEN: A/Ox3; pleasant , NAD, elderly , thin very anxious bm nad   Filed Vitals:   09/12/15 1600  BP: 130/72  Pulse: 59  Temp: 97.5 F (36.4 C)  TempSrc: Oral  Height: 6\' 1"  (1.854 m)  Weight: 185 lb (83.915 kg)  SpO2: 96%    07/07/2014        188  > 07/20/2014     182 08/03/2014 >>    11/01/2014  181 > 12/12/2014   177 >182 01/03/2015 >   04/04/2015  183 >04/28/2015 184 > 08/29/2015  181    Vital signs reviewed      HEENT:  [place]/AT,  EACs-clear, TMs-wnl, NOSE-clear, THROAT-clear, no lesions, no postnasal drip or exudate noted.  Top dentures -Few scattered white patches along the upper inner lip and gum area   NECK:  Supple w/ fair ROM; no JVD; normal carotid impulses w/o bruits; no thyromegaly or nodules palpated; no lymphadenopathy.  RESP   bs are distant with no wheezing or crackles.  no accessory muscle use, no dullness to percussion  CARD:  RRR, no m/r/g  , no peripheral edema, pulses intact, no cyanosis or clubbing.  GI:   Soft & nt; nml bowel sounds; no organomegaly or masses detected.  Musco: Warm bil, no deformities or joint swelling noted.   Neuro: alert, no focal deficits noted.    Skin: Warm, no lesions or rashes   Tammy Parrett NP-C  [facility]  09/12/2015

## 2015-09-12 NOTE — Patient Instructions (Addendum)
Continue on Spiriva .  Nystatin  Oral suspension for 1 week.  Begin Delsym 2 tsp Twice daily  As needed  Cough .  Change Mucinex DM to Mucinex reg strength Twice daily  As needed  Congestion .  Follow med calendar closely and bring to each visit.  Follow up Dr. Melvyn Novas  In 3  months and As needed   Please contact office for sooner follow up if symptoms do not improve or worsen or seek emergency care

## 2015-09-12 NOTE — Assessment & Plan Note (Signed)
Appears to be well controlled on Spiriva Patient's medications were reviewed today and patient education was given. Computerized medication calendar was adjusted/completed  Change Mucinex DM 2 Mucinex and Delsym for cough control  Plan  Continue on Spiriva .  Nystatin  Oral suspension for 1 week.  Begin Delsym 2 tsp Twice daily  As needed  Cough .  Change Mucinex DM to Mucinex reg strength Twice daily  As needed  Congestion .  Follow med calendar closely and bring to each visit.  Follow up Dr. Melvyn Novas  In 3  months and As needed   Please contact office for sooner follow up if symptoms do not improve or worsen or seek emergency care

## 2015-09-13 ENCOUNTER — Telehealth: Payer: Self-pay | Admitting: Adult Health

## 2015-09-13 NOTE — Addendum Note (Signed)
Addended by: Osa Craver on: 09/13/2015 11:50 AM   Modules accepted: Medications

## 2015-09-13 NOTE — Telephone Encounter (Signed)
Med Calender placed at front for pick up.  Pt aware and voiced no further questions or concerns at this time.

## 2015-09-13 NOTE — Progress Notes (Signed)
Chart and office note reviewed in detail  > agree with a/p as outlined    

## 2015-09-13 NOTE — Addendum Note (Signed)
Addended by: Osa Craver on: 09/13/2015 11:52 AM   Modules accepted: Orders, Medications

## 2015-09-25 DIAGNOSIS — M25571 Pain in right ankle and joints of right foot: Secondary | ICD-10-CM | POA: Diagnosis not present

## 2015-11-01 ENCOUNTER — Encounter: Payer: Self-pay | Admitting: Adult Health

## 2015-11-01 ENCOUNTER — Ambulatory Visit (INDEPENDENT_AMBULATORY_CARE_PROVIDER_SITE_OTHER): Payer: Medicare Other | Admitting: Adult Health

## 2015-11-01 VITALS — BP 136/88 | HR 73 | Temp 97.3°F | Ht 73.0 in | Wt 180.0 lb

## 2015-11-01 DIAGNOSIS — J449 Chronic obstructive pulmonary disease, unspecified: Secondary | ICD-10-CM

## 2015-11-01 MED ORDER — HYDROCODONE-HOMATROPINE 5-1.5 MG/5ML PO SYRP: 5.0000 mL | ORAL_SOLUTION | Freq: Four times a day (QID) | ORAL | Status: DC | PRN

## 2015-11-01 MED ORDER — PREDNISONE 10 MG PO TABS: ORAL_TABLET | ORAL | Status: DC

## 2015-11-01 MED ORDER — AZITHROMYCIN 250 MG PO TABS: ORAL_TABLET | ORAL | Status: AC

## 2015-11-01 NOTE — Progress Notes (Signed)
Chart and office note reviewed in detail  > agree with a/p as outlined    

## 2015-11-01 NOTE — Progress Notes (Signed)
Subjective:     Patient ID: Jeremiah [name], male   DOB: 08-22-1945   MRN: [id number]    Brief patient profile:  30 yobm quit smoking 1989 with no problems with variable sob x 2004 and maintained on inhalers referred 05/03/2013 by Dr Titus Mould for evaluation of outpt management for copd after hospitalization and found to have GOLD III COPD 01/2014  with tendency to panic when sob.  Admit date: 04/24/2013  Discharge date: 04/27/2013  Discharge Diagnoses:  Active Problems:  COPD exacerbation  Detailed Hospital Course:  70 yo male with abrupt onset of sob (1/10) that progressively worsened until EMS called. Patient intubated in ED due to severe respiratory distress. In route, patient had increasing tachypnea, decline SpO2, and obtundation. Patient with GCS <8 at arrival, evidence of emesis, intubated for severe respiratory distress. Family reported no evidence of fevers, chills, malaise, worsening coughing, and sob. Initial episode passed, however sister received a call later with him reporting a return of symptoms, complaints of inability of breathing and EMS alerted. Distant history of tobacco abuse. Has never been hospitalized for COPD before this episode. He has had no recent hospitalizations, and has never been previously intubated.  He was admitted to the ICU on 1/11 and was treated with the typical modalities for acute respiratory failure (for presumed AECODP) which included mechanical ventilation with sedation, inhaled bronchodilators, antibiotics, and systemic steroid therapy. He improved later that day and was able to be extubated and sedation was stopped. His care since that point has primarily focused on the de-escalation of the therapies mentioned above. On 1/13 he has returned to a near-baseline state of health and has been deemed a candidate for discharge.  Discharge Plan by diagnoses  Acute exacerbation of COPD  Acute hypercarbic respiratory failure (resolved)  plan  -Follow up with Dr. Melvyn Novas  on 05/03/2013 at 2:30PM  -Change steroids to PO Prednisone, taper over 7-10d  -Resume home dose BDs    History of Present Illness  05/03/2013 1st [facility] office visit/ Wert cc more sob since Oct 2014 on symbicort 160 2 bid and spiriva daily  But still  daily use of proventil and no need for neb then needed neb x sev months prior to his admit above but back to baseline since d/c on pred still tapering off but using saba qid, not prn "out of habit" >>return for PFT   01/03/2014 Follow up  [name] .  Complains of increased episodes of increased DOE, wheezing, prod cough with white/clear mucus worse for last week. Family member says he has been progressively get worse over last year. Was admitted 04/2013 w/ COPD flare /VDRF . [name] from last office visit however did not follow up .   Followed at [facility] , says he was told he has Moderate COPD 6 yrs ago. On Symbicort and Spiriva.  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  Says he gets winded easily esp on incline.  Says worse for last week with increased cough and wheezing.  No hemotpysis, wt loss, calf pain, n/v/d, chest pain or fever.  Last abx ~2 months ago.  rec Doxycycline 100mg  Twice daily  For 7 days  Prednisone taper over next week Mucinex DM Twice daily  As needed  Cough /congestion  Continue on Symbicort and Spiriva .    01/24/2014 f/u ov/Wert re:  GOLD III Chief  Complaint  Patient presents with  . Follow-up    PFT done today. Pt states that SOB and cough have improved some, but not back to normal baseline. Cough is prod with minimal clear sputum.   can do HT but no mall due to sob Prednisone really helped a lot but did not maintain improvement once finished >>pred taper add ppi bid and diet     12/12/2014 acute  ov/Wert re: ? aecopd /new gi/gu complaints ? From spiriva?   Chief Complaint  Patient presents  with  . Acute Visit    Pt c/o increased SOB and cough x 4 days. Cough is not very prod- clear sputum. He also c/o increased gas and bloating.   downhill since prev ov, changing his own maint rx (dc'd pepcid) >  severe cough at hs onset 12/09/14  On spiriva lots of gi/gu issues  Started pred one day prior to OV  As per calendar action plan  Has only used saba so far in hfa form, has neb but doesn't feel he needs it yet.  >changed spiriva to [place]     04/04/2015  f/u ov/Wert re: copd  Chief Complaint  Patient presents with  . Follow-up    pt following for COPD: pt states hes been its been on and off. pt states 3 weekws ago he was diagnosed with bronchitits but he feels much better. pt c/o a dry cough thats been bothering him mostly in the night time. pt states at times he has to take the CPAP off because he has these couhing fits. no c/o SOB , wheezing, or chest tightness.   has med calendar but not following the action plans at the bottom for cough  > For cough/ congestion > mucinex dm and flutter valve       07/20/2015  f/u ov/Wert re: aecopd/ using med calendar though not flutter or approp dose of mucinex dm/ confused with when to use pred  Chief Complaint  Patient presents with  . Follow-up    pt c/o nonprod cough, chest congestion, sob X4-5 days.  S/s worse qhs.    last prednisone finished on 07/16/15 and getting worse again in terms of breathing and dry coughing rec In the event that you need your nebulizer more than rarely for your breathing: prednisone 20 mg daily until better  Then 10 mg daily x 5 days and stop For cough > mucinex dm 1200 mg every 12 hours and use the flutter valve Work on inhaler technique    08/29/2015  f/u ov/Wert re:  GOLD III  symbicort 160 2bid/ tudorza  Chief Complaint  Patient presents with  . Follow-up    Cough has not improved since the last visit. He uses ventolin at least once per day and neb with albuterol 2 x daily on average.   Still using  oil based vit D, following med calendar better / rarely taking prednisone but helps when does  Lots of hoarseness and dry cough  Day >   >stop [place] and begin Spiriva   09/12/2015 follow up : COPD GOLD III , Med review  Pt returns for a 2 week follow up . Patient says that he is doing well except he still has a dry cough. He denies discolored mucus, fever, chest pain, orthopnea, PND, or increased leg swelling. We reviewed all his medications organize them into a medication count with patient education Appears he is taking correctly PVX , TDAP , Prevnar 13  Are utd.  Like Spiriva a lot better, breathing is doing well.  Recently got dentures and feels that he has some irritation along gums and inside of upper lip.  >>nystatin rx .   11/01/2015 Acute OV  Presents for an acute office visit Patient complains of 2 weeks of increased cough, shortness of breath and wheezing. Feels that the high temperatures and humidity or making his breathing worse. Wears out with minimum activity   Denies any chest congestion/tightness, sinus pressure/drainage, fever, nausea or vomiting.  Has any fever or discolored mucus. Remains on Symbicort and Spiriva. Did start on prednisone over the last 1-2 days. Still has persistent cough and wheezing. Cough is keeping him up at night.. Patient and wife have multiple questions about his severity of his underlying COPD. He went over purse lip breathing. He would like to try pulmonary rehabilitation again.   Current Medications, Allergies, Complete Past Medical History, Past Surgical History, Family History, and Social History were reviewed in [employer] record.  ROS  The following are not active complaints unless bolded sore throat, dysphagia, dental problems, itching, sneezing,  nasal congestion or excess/ purulent secretions, ear ache,   fever, chills, sweats, unintended wt loss, classically pleuritic or exertional cp,  orthopnea pnd or leg  swelling, presyncope, palpitations, abdominal pain, anorexia, nausea, vomiting, diarrhea  or change in bowel or bladder habits, change in stools or urine, dysuria,hematuria,  rash, arthralgias, visual complaints, headache, numbness, weakness or ataxia or problems with walking or coordination,  change in mood/affect or memory.                   Objective:       Physical Exam  GEN: A/Ox3; pleasant , NAD, elderly , thin very anxious bm nad  Filed Vitals:   11/01/15 1014  BP: 136/88  Pulse: 73  Temp: 97.3 F (36.3 C)  TempSrc: Oral  Height: 6\' 1"  (1.854 m)  Weight: 180 lb (81.647 kg)  SpO2: 94%      07/07/2014        188  > 07/20/2014     182 08/03/2014 >>    11/01/2014  181 > 12/12/2014   177 >182 01/03/2015 >  04/04/2015  183 >04/28/2015 184 > 08/29/2015  181    Vital signs reviewed      HEENT:  [place]/AT,  EACs-clear, TMs-wnl, NOSE-clear, THROAT-clear, no lesions, no postnasal drip or exudate noted.  Top dentures -Few scattered white patches along the upper inner lip and gum area   NECK:  Supple w/ fair ROM; no JVD; normal carotid impulses w/o bruits; no thyromegaly or nodules palpated; no lymphadenopathy.  RESP   Few exp wheezing .  no accessory muscle use, no dullness to percussion  CARD:  RRR, no m/r/g  , no peripheral edema, pulses intact, no cyanosis or clubbing.  GI:   Soft & nt; nml bowel sounds; no organomegaly or masses detected.  Musco: Warm bil, no deformities or joint swelling noted.   Neuro: alert, no focal deficits noted.    Skin: Warm, no lesions or rashes   Jenisse Vullo NP-C  [facility]  11/01/2015

## 2015-11-01 NOTE — Assessment & Plan Note (Signed)
Exacerbation  If not improving on return, check cxr   Plan  Zpack to have on hold if symtpoms worsen with discolored mucus.  Prednisone taper over next week.  Hydromet 1 tsp every 6hr, As needed  Cough , may make sleepy.  Refer to pulmonary rehab Follow up Dr. Melvyn Novas  In 6 weeks as planned and As needed   Please contact office for sooner follow up if symptoms do not improve or worsen or seek emergency care

## 2015-11-01 NOTE — Patient Instructions (Addendum)
Zpack to have on hold if symtpoms worsen with discolored mucus.  Prednisone taper over next week.  Hydromet 1 tsp every 6hr, As needed  Cough , may make sleepy.  Refer to pulmonary rehab Follow up Dr. Melvyn Novas  In 6 weeks as planned and As needed   Please contact office for sooner follow up if symptoms do not improve or worsen or seek emergency care

## 2015-11-02 DIAGNOSIS — R7302 Impaired glucose tolerance (oral): Secondary | ICD-10-CM | POA: Diagnosis not present

## 2015-11-02 DIAGNOSIS — G4700 Insomnia, unspecified: Secondary | ICD-10-CM | POA: Diagnosis not present

## 2015-11-02 DIAGNOSIS — J449 Chronic obstructive pulmonary disease, unspecified: Secondary | ICD-10-CM | POA: Diagnosis not present

## 2015-11-02 DIAGNOSIS — R6882 Decreased libido: Secondary | ICD-10-CM | POA: Diagnosis not present

## 2015-11-02 DIAGNOSIS — Z1159 Encounter for screening for other viral diseases: Secondary | ICD-10-CM | POA: Diagnosis not present

## 2015-11-02 DIAGNOSIS — J40 Bronchitis, not specified as acute or chronic: Secondary | ICD-10-CM | POA: Diagnosis not present

## 2015-11-02 DIAGNOSIS — E559 Vitamin D deficiency, unspecified: Secondary | ICD-10-CM | POA: Diagnosis not present

## 2015-11-02 DIAGNOSIS — I10 Essential (primary) hypertension: Secondary | ICD-10-CM | POA: Diagnosis not present

## 2015-11-08 DIAGNOSIS — R252 Cramp and spasm: Secondary | ICD-10-CM | POA: Diagnosis not present

## 2015-11-08 DIAGNOSIS — E291 Testicular hypofunction: Secondary | ICD-10-CM | POA: Diagnosis not present

## 2015-11-08 DIAGNOSIS — R350 Frequency of micturition: Secondary | ICD-10-CM | POA: Diagnosis not present

## 2015-11-08 DIAGNOSIS — R6882 Decreased libido: Secondary | ICD-10-CM | POA: Diagnosis not present

## 2015-11-10 ENCOUNTER — Other Ambulatory Visit: Payer: Self-pay | Admitting: Adult Health

## 2015-11-11 ENCOUNTER — Telehealth: Payer: Self-pay | Admitting: Pulmonary Disease

## 2015-11-11 DIAGNOSIS — R05 Cough: Secondary | ICD-10-CM

## 2015-11-11 DIAGNOSIS — B370 Candidal stomatitis: Secondary | ICD-10-CM

## 2015-11-11 DIAGNOSIS — R059 Cough, unspecified: Secondary | ICD-10-CM

## 2015-11-11 MED ORDER — NYSTATIN 100000 UNIT/ML MT SUSP: 5.0000 mL | Freq: Four times a day (QID) | OROMUCOSAL | 0 refills | Status: DC

## 2015-11-11 MED ORDER — BENZONATATE 100 MG PO CAPS: 100.0000 mg | ORAL_CAPSULE | Freq: Three times a day (TID) | ORAL | 0 refills | Status: DC | PRN

## 2015-11-11 NOTE — Telephone Encounter (Signed)
Patient called reporting ongoing nocturnal cough as well as gaseous distention and eructation. Also reporting intermittent wheezing. Patient did not cough the entire time of my conversation. Patient was prescribed codeine cough syrup on 7/19 office visit and reports he has run completely out. Also reporting oral thrush. Prescribing Tessalon Perles in place of hydrocodone cough syrup. Also prescribing nystatin swish and swallow. Recommended patient avoid eating within 3-4 hours of bedtime. Reports he is compliant with his PPI and H2 blocker. Also reports that of his bed is elevated. Denies any abdominal pain, dyspepsia, reflux, or morning brash water taste. I will make Dr. Melvyn Novas aware to contact the patient on Monday.

## 2015-11-13 NOTE — Telephone Encounter (Signed)
Spoke with the pt and scheduled ov with TP for 11/28/15  This was the soonest he could come in b/c he is going out of town

## 2015-11-14 NOTE — Telephone Encounter (Signed)
TP please advise on refill. Thanks. 

## 2015-11-24 ENCOUNTER — Telehealth: Payer: Self-pay | Admitting: Internal Medicine

## 2015-11-24 NOTE — Telephone Encounter (Signed)
Pt cb stating he received a call from [employer] stating that they do not have any forms that need to be filled out for him by MW, patient calling to see if we have them forms here because this was faxed by insurance company on 10/27/15, I informed patient that all forms that have to be signed by physicians pertaining to disability have to go through [employer] and if we received them, then we would have forwarded them to [employer]. I informed patient that he may want to call his insurance company and have them resend forms to [employer]

## 2015-11-27 NOTE — Telephone Encounter (Signed)
Will sign off of message since pt will contact insurance company to have forms refaxed.

## 2015-11-28 ENCOUNTER — Ambulatory Visit: Payer: Non-veteran care | Admitting: Adult Health

## 2015-12-04 ENCOUNTER — Other Ambulatory Visit: Payer: Self-pay | Admitting: Internal Medicine

## 2015-12-08 ENCOUNTER — Encounter (HOSPITAL_COMMUNITY): Payer: Self-pay

## 2015-12-08 ENCOUNTER — Encounter (HOSPITAL_COMMUNITY)
Admission: RE | Admit: 2015-12-08 | Discharge: 2015-12-08 | Disposition: A | Discharge status: Home / Self Care | Payer: Medicare Other | Source: Ambulatory Visit | Attending: Internal Medicine | Admitting: Internal Medicine

## 2015-12-08 VITALS — BP 177/92 | HR 56 | Resp 18 | Ht 72.0 in | Wt 185.6 lb

## 2015-12-08 DIAGNOSIS — J449 Chronic obstructive pulmonary disease, unspecified: Secondary | ICD-10-CM | POA: Insufficient documentation

## 2015-12-08 NOTE — Progress Notes (Signed)
Jeremiah [name] 70 y.o. male Pulmonary Rehab Orientation Note Patient arrived today in Cardiac and Pulmonary Rehab for orientation to Pulmonary Rehab. He ambulated from [name] parking without difficulty. He does not carry portable oxygen. Per pt, he is compliant with his CPAP use at HS. Color good, skin warm and dry. Patient is oriented to time and place. Patient's medical history, psychosocial health, and medications reviewed. Psychosocial assessment reveals pt lives with their spouse. 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 He and his wife are retired. He enjoys being an active church member, going to movies, reading, and watching TV. Pt reports his stress level is moderate. Areas of stress/anxiety include his relationship with his siblings. He did not go into detail, but it involves settling his fathers property. Pt does not exhibit signs of depression. PHQ2/9 score 0/na. Pt shows good  coping skills with positive outlook . He was offered emotional support and reassurance. Will continue to monitor and evaluate progress toward psychosocial goal(s) of maintaining realistic goals and a positive outlook. Physical assessment reveals heart rate is bradycardic, breath sounds mild expiratory wheezing heard both upper lobes. Grip strength equal, strong. Distal pulses palpable. No edema noted. Patient reports hedoes take medications as prescribed. Patient states he follows a Regular diet. The patient reports no specific efforts to gain or lose weight.. Patient's weight will be monitored closely. Demonstration and practice of PLB using pulse oximeter. Patient able to return demonstration satisfactorily. Safety and hand hygiene in the exercise area reviewed with patient. Patient voices understanding of the information reviewed. Department expectations discussed with patient and achievable goals were set. The patient shows enthusiasm about attending the program and we look forward to working with this nice  gentleman. The patient is scheduled for a 6 min walk test on 12/12/15 and to begin exercise on 12/19/15, after his initial face to face contact with the pulmonary rehab medical director.   45 minutes was spent on a variety of activities such as assessment of the patient, obtaining baseline data including height, weight, BMI, and grip strength, verifying medical history, allergies, and current medications, and teaching patient strategies for performing tasks with less respiratory effort with emphasis on pursed lip breathing.

## 2015-12-11 ENCOUNTER — Ambulatory Visit: Payer: Non-veteran care | Admitting: Adult Health

## 2015-12-12 ENCOUNTER — Encounter (HOSPITAL_COMMUNITY)
Admission: RE | Admit: 2015-12-12 | Discharge: 2015-12-12 | Disposition: A | Discharge status: Home / Self Care | Payer: Medicare Other | Source: Ambulatory Visit | Attending: Internal Medicine | Admitting: Internal Medicine

## 2015-12-12 DIAGNOSIS — J449 Chronic obstructive pulmonary disease, unspecified: Secondary | ICD-10-CM | POA: Diagnosis not present

## 2015-12-12 DIAGNOSIS — J438 Other emphysema: Secondary | ICD-10-CM

## 2015-12-13 ENCOUNTER — Ambulatory Visit: Payer: Non-veteran care | Admitting: Internal Medicine

## 2015-12-14 DIAGNOSIS — J449 Chronic obstructive pulmonary disease, unspecified: Secondary | ICD-10-CM | POA: Diagnosis not present

## 2015-12-14 DIAGNOSIS — I10 Essential (primary) hypertension: Secondary | ICD-10-CM | POA: Diagnosis not present

## 2015-12-14 DIAGNOSIS — G4700 Insomnia, unspecified: Secondary | ICD-10-CM | POA: Diagnosis not present

## 2015-12-14 DIAGNOSIS — E291 Testicular hypofunction: Secondary | ICD-10-CM | POA: Diagnosis not present

## 2015-12-14 NOTE — Progress Notes (Signed)
Pulmonary Individual Treatment Plan  Patient Details  Name: Jeremiah [name] MRN: [id number] Date of Birth: [birth year] Referring Provider:   [name] Pulmonary Rehab Walk Test from 12/12/2015 in [place]  Referring Provider  Dr. Melvyn Novas      Initial Encounter Date:  Flowsheet Row Pulmonary Rehab Walk Test from 12/12/2015 in [place]  Date  12/12/15  Referring Provider  Dr. Melvyn Novas      Visit Diagnosis: Chronic obstructive pulmonary disease, unspecified COPD type ([place])  Other emphysema ([place])  Patient's Home Medications on Admission:   Current Outpatient Prescriptions:  .  albuterol (PROVENTIL HFA;VENTOLIN HFA) 108 (90 BASE) MCG/ACT inhaler, Inhale 2 puffs into the lungs every 4 (four) hours as needed for wheezing or shortness of breath (((PLAN B)))., Disp: 1 Inhaler, Rfl: 5 .  albuterol (PROVENTIL) (2.5 MG/3ML) 0.083% nebulizer solution, Take 3 mLs by nebulization every 4 (four) hours as needed for wheezing or shortness of breath (((PLAN C))). , Disp: , Rfl:  .  ALPRAZolam (XANAX) 0.5 MG tablet, TAKE 1 TABLET BY MOUTH TWICE A DAY AS NEEDED FOR ANXIETY, Disp: 60 tablet, Rfl: 1 .  atenolol-chlorthalidone (TENORETIC) 50-25 MG tablet, Take 1 tablet by mouth at bedtime., Disp: , Rfl: 11 .  B-D 3CC LUER-LOK SYR 21GX1-1/2 21G X 1-1/2" 3 ML MISC, USE NEEDLE/SYRINGE TO INJECT TESTOSTERONE INTO SKIN ONCE EVERY 2 WEEKS., Disp: , Rfl: 0 .  benzonatate (TESSALON) 100 MG capsule, Take 1 capsule (100 mg total) by mouth 3 (three) times daily as needed for cough. (Patient not taking: Reported on 12/08/2015), Disp: 30 capsule, Rfl: 0 .  budesonide-formoterol (SYMBICORT) 160-4.5 MCG/ACT inhaler, Inhale 2 puffs into the lungs 2 (two) times daily., Disp: , Rfl:  .  buPROPion (WELLBUTRIN XL) 150 MG 24 hr tablet, Take 1 tablet by mouth every morning., Disp: , Rfl: 4 .  CARAFATE 1 GM/10ML suspension, TAKE 10 MLS (1 GRAM TOTAL) BY MOUTH 4 TIMES A DAY,  Disp: , Rfl: 1 .  cetirizine (ZYRTEC) 10 MG tablet, Take 10 mg by mouth at bedtime as needed (drainage). , Disp: , Rfl:  .  Cholecalciferol (VITAMIN D3) 2000 units TABS, Take 1 tablet by mouth every morning., Disp: , Rfl:  .  dextromethorphan (DELSYM) 30 MG/5ML liquid, Take 2 tsp twice daily as needed with flutter valve still coughing, Disp: , Rfl:  .  dextromethorphan-guaiFENesin (MUCINEX DM) 30-600 MG per 12 hr tablet, Take 31ml every 4 hours as needed with flutter valve for cough and congestion, Disp: , Rfl:  .  esomeprazole (NEXIUM) 40 MG capsule, Take 40 mg by mouth daily before breakfast. , Disp: , Rfl: 2 .  famotidine (PEPCID) 20 MG tablet, Take 20 mg by mouth at bedtime., Disp: , Rfl:  .  fluticasone (FLONASE) 50 MCG/ACT nasal spray, Place 2 sprays into both nostrils 2 (two) times daily., Disp: , Rfl:  .  hydrochlorothiazide (HYDRODIURIL) 25 MG tablet, Take 25 mg by mouth every morning. , Disp: , Rfl:  .  HYDROcodone-homatropine (HYDROMET) 5-1.5 MG/5ML syrup, Take 5 mLs by mouth every 6 (six) hours as needed., Disp: 240 mL, Rfl: 0 .  magnesium oxide (MAG-OX) 400 MG tablet, Take 400 mg by mouth 2 (two) times daily. For leg cramps, Disp: , Rfl:  .  Multiple Vitamin (CALCIUM COMPLEX PO), Take 1 tablet by mouth daily., Disp: , Rfl:  .  naproxen sodium (ANAPROX) 220 MG tablet, Per bottle as needed for joint pain, Disp: , Rfl:  .  NON FORMULARY, Use CPAP at bedtime, Disp: , Rfl:  .  NON FORMULARY, PeriodGard  Take 1 tsp and swish and spit, Disp: , Rfl:  .  nystatin (MYCOSTATIN) 100000 UNIT/ML suspension, Take 5 mLs (500,000 Units total) by mouth 4 (four) times daily. Take until your thrush is gone. Swish & swallow medication., Disp: 60 mL, Rfl: 0 .  Polyvinyl Alcohol (LIQUIFILM TEARS OP), Reported on 09/13/2015, Disp: , Rfl:  .  predniSONE (DELTASONE) 10 MG tablet, Taper as directed if breathing worsens, Disp: , Rfl: 0 .  predniSONE (DELTASONE) 10 MG tablet, 4 tabs for 2 days, then 3 tabs for 2  days, 2 tabs for 2 days, then 1 tab for 2 days, then stop (Patient not taking: Reported on 12/08/2015), Disp: 20 tablet, Rfl: 0 .  Respiratory Therapy Supplies (FLUTTER) DEVI, Use as directed, Disp: 1 each, Rfl: 0 .  Simethicone (GAS-X PO), Take as directed for gas, Disp: , Rfl:  .  testosterone cypionate (DEPOTESTOSTERONE CYPIONATE) 200 MG/ML injection, INJECT 1 ML (200 MG TOTAL) INTO THE MUSCLE EVERY 14 (FOURTEEN) DAYS., Disp: , Rfl: 0 .  Tiotropium Bromide Monohydrate (SPIRIVA RESPIMAT) 2.5 MCG/ACT AERS, 2 each am, Disp: 1 Inhaler, Rfl: 11  Past Medical History: Past Medical History:  Diagnosis Date  . COPD (chronic obstructive pulmonary disease) ([place])   . Depression   . Hypertension   . PTSD (post-traumatic stress disorder)   . Sleep apnea     Tobacco Use: History  Smoking Status  . Former Smoker  . Packs/day: 1.00  . Years: 30.00  . Types: Cigarettes  . Quit date: 04/16/1987  Smokeless Tobacco  . Never Used    Labs: Recent Review Flowsheet Data    Labs for ITP Cardiac and Pulmonary Rehab Latest Ref Rng & Units 04/24/2013 04/24/2013 04/25/2013   Trlycerides <150 mg/dL - - 29   PHART 7.350 - 7.450 7.101(LL) 7.175(LL) 7.440   PCO2ART 35.0 - 45.0 mmHg 85.1(HH) 61.4(HH) 30.2(L)   HCO3 20.0 - 24.0 mEq/L 27.4(H) 23.2 20.2   TCO2 0 - 100 mmol/L 30 25 21.1   ACIDBASEDEF 0.0 - 2.0 mmol/L 6.0(H) 7.0(H) 3.3(H)   O2SAT % 100.0 100.0 98.9      Capillary Blood Glucose: Lab Results  Component Value Date   GLUCAP 144 (H) 04/27/2013   GLUCAP 97 04/27/2013   GLUCAP 101 (H) 04/27/2013   GLUCAP 119 (H) 04/27/2013   GLUCAP 130 (H) 04/27/2013     ADL UCSD:   Pulmonary Function Assessment:     Pulmonary Function Assessment - 12/08/15 1035      Breath   Bilateral Breath Sounds Wheezes  mild wheezing heard in upper lobes bilat   Shortness of Breath Yes;Fear of Shortness of Breath;Limiting activity      Exercise Target Goals: Date: 12/12/15  Exercise Program  Goal: Individual exercise prescription set with THRR, safety & activity barriers. Participant demonstrates ability to understand and report RPE using BORG scale, to self-measure pulse accurately, and to acknowledge the importance of the exercise prescription.  Exercise Prescription Goal: Starting with aerobic activity 30 plus minutes a day, 3 days per week for initial exercise prescription. Provide home exercise prescription and guidelines that participant acknowledges understanding prior to discharge.  Activity Barriers & Risk Stratification:     Activity Barriers & Cardiac Risk Stratification - 12/08/15 1034      Activity Barriers & Cardiac Risk Stratification   Activity Barriers Shortness of Breath;Deconditioning      6 Minute Walk:  [place] Name 12/14/15 0714         6 Minute Walk   Phase Initial     Distance 1275 feet     Walk Time 6 minutes     # of Rest Breaks 0     MPH 2.41     METS 2.84     RPE 11     Perceived Dyspnea  1     Symptoms No     Resting HR 59 bpm     Resting BP 124/80     Max Ex. HR 74 bpm     Max Ex. BP 134/80       Interval HR   Baseline HR 59     1 Minute HR 69     2 Minute HR 73     3 Minute HR 73     4 Minute HR 74     5 Minute HR 74     6 Minute HR 74     2 Minute Post HR 67     Interval Heart Rate? Yes       Interval Oxygen   Interval Oxygen? Yes     Baseline Oxygen Saturation % 95 %     Baseline Liters of Oxygen 0 L     1 Minute Oxygen Saturation % 94 %     1 Minute Liters of Oxygen 0 L     2 Minute Oxygen Saturation % 94 %     2 Minute Liters of Oxygen 0 L     3 Minute Oxygen Saturation % 94 %     3 Minute Liters of Oxygen 0 L     4 Minute Oxygen Saturation % 94 %     4 Minute Liters of Oxygen 0 L     5 Minute Oxygen Saturation % 94 %     5 Minute Liters of Oxygen 0 L     6 Minute Oxygen Saturation % 94 %     6 Minute Liters of Oxygen 0 L     2 Minute Post Oxygen Saturation % 95 %     2 Minute Post  Liters of Oxygen 0 L        Initial Exercise Prescription:     Initial Exercise Prescription - 12/14/15 0700      Date of Initial Exercise RX and Referring Provider   Date 12/12/15   Referring Provider Dr. Melvyn Novas     Treadmill   MPH 2   Grade 0   Minutes 17     NuStep   Level 3   Minutes 17   METs 1.5     Rower   Level 2   Minutes 17     Prescription Details   Frequency (times per week) 2   Duration Progress to 45 minutes of aerobic exercise without signs/symptoms of physical distress     Intensity   THRR 40-80% of Max Heartrate 68-122   Ratings of Perceived Exertion 11-13   Perceived Dyspnea 0-4     Progression   Progression Continue progressive overload as per policy without signs/symptoms or physical distress.     Resistance Training   Training Prescription Yes   Weight blue bands   Reps 10-12      Perform Capillary Blood Glucose checks as needed.  Exercise Prescription Changes:   Exercise Comments:   Discharge Exercise Prescription (Final Exercise Prescription Changes):    Nutrition:  Target Goals: Understanding  of nutrition guidelines, daily intake of sodium 1500mg , cholesterol 200mg , calories 30% from fat and 7% or less from saturated fats, daily to have 5 or more servings of fruits and vegetables.  Biometrics:     Pre Biometrics - 12/08/15 1036      Pre Biometrics   Grip Strength 25 kg       Nutrition Therapy Plan and Nutrition Goals:   Nutrition Discharge: Rate Your Plate Scores:   Psychosocial: Target Goals: Acknowledge presence or absence of depression, maximize coping skills, provide positive support system. Participant is able to verbalize types and ability to use techniques and skills needed for reducing stress and depression.  Initial Review & Psychosocial Screening:     Initial Psych Review & Screening - 12/08/15 1045      Initial Review   Current issues with Current Stress Concerns   Comments family relationships      Family Dynamics   Good Support System? Yes     Barriers   Psychosocial barriers to participate in program There are no identifiable barriers or psychosocial needs.     Screening Interventions   Interventions Encouraged to exercise      Quality of Life Scores:   PHQ-9: Recent Review Flowsheet Data    Depression screen [facility] 2/9 12/08/2015 12/28/2014 02/14/2014   Decreased Interest 0 0 0   Down, Depressed, Hopeless 0 0 0   PHQ - 2 Score 0 0 0      Psychosocial Evaluation and Intervention:     Psychosocial Evaluation - 12/08/15 1046      Psychosocial Evaluation & Interventions   Interventions Encouraged to exercise with the program and follow exercise prescription      Psychosocial Re-Evaluation:  Education: Education Goals: Education classes will be provided on a weekly basis, covering required topics. Participant will state understanding/return demonstration of topics presented.  Learning Barriers/Preferences:     Learning Barriers/Preferences - 12/08/15 1035      Learning Barriers/Preferences   Learning Barriers None   Learning Preferences Computer/Internet;Group Instruction;Individual Instruction;Verbal Instruction;Written Material      Education Topics: Risk Factor Reduction:  -Group instruction that is supported by a PowerPoint presentation. Instructor discusses the definition of a risk factor, different risk factors for pulmonary disease, and how the heart and lungs work together.     Nutrition for Pulmonary Patient:  -Group instruction provided by PowerPoint slides, verbal discussion, and written materials to support subject matter. The instructor gives an explanation and review of healthy diet recommendations, which includes a discussion on weight management, recommendations for fruit and vegetable consumption, as well as protein, fluid, caffeine, fiber, sodium, sugar, and alcohol. Tips for eating when patients are short of breath are  discussed.   Pursed Lip Breathing:  -Group instruction that is supported by demonstration and informational handouts. Instructor discusses the benefits of pursed lip and diaphragmatic breathing and detailed demonstration on how to preform both.     Oxygen Safety:  -Group instruction provided by PowerPoint, verbal discussion, and written material to support subject matter. There is an overview of "What is Oxygen" and "Why do we need it".  Instructor also reviews how to create a safe environment for oxygen use, the importance of using oxygen as prescribed, and the risks of noncompliance. There is a brief discussion on traveling with oxygen and resources the patient may utilize.   Oxygen Equipment:  -Group instruction provided by [facility] Staff utilizing handouts, written materials, and equipment demonstrations.   Signs and Symptoms:  -Group instruction  provided by written material and verbal discussion to support subject matter. Warning signs and symptoms of infection, stroke, and heart attack are reviewed and when to call the physician/911 reinforced. Tips for preventing the spread of infection discussed.   Advanced Directives:  -Group instruction provided by verbal instruction and written material to support subject matter. Instructor reviews Advanced Directive laws and proper instruction for filling out document.   Pulmonary Video:  -Group video education that reviews the importance of medication and oxygen compliance, exercise, good nutrition, pulmonary hygiene, and pursed lip and diaphragmatic breathing for the pulmonary patient.   Exercise for the Pulmonary Patient:  -Group instruction that is supported by a PowerPoint presentation. Instructor discusses benefits of exercise, core components of exercise, frequency, duration, and intensity of an exercise routine, importance of utilizing pulse oximetry during exercise, safety while exercising, and options of places to exercise outside  of rehab.     Pulmonary Medications:  -Verbally interactive group education provided by instructor with focus on inhaled medications and proper administration.   Anatomy and Physiology of the Respiratory System and Intimacy:  -Group instruction provided by PowerPoint, verbal discussion, and written material to support subject matter. Instructor reviews respiratory cycle and anatomical components of the respiratory system and their functions. Instructor also reviews differences in obstructive and restrictive respiratory diseases with examples of each. Intimacy, Sex, and Sexuality differences are reviewed with a discussion on how relationships can change when diagnosed with pulmonary disease. Common sexual concerns are reviewed.   Knowledge Questionnaire Score:   Core Components/Risk Factors/Patient Goals at Admission:     Personal Goals and Risk Factors at Admission - 12/08/15 1036      Core Components/Risk Factors/Patient Goals on Admission    Weight Management Weight Loss;Yes   Admit Weight 185 lb (83.9 kg)   Goal Weight: Long Term 170 lb (77.1 kg)   Expected Outcomes Weight Loss: Understanding of general recommendations for a balanced deficit meal plan, which promotes 1-2 lb weight loss per week and includes a negative energy balance of [phone] kcal/d;Understanding recommendations for meals to include 15-35% energy as protein, 25-35% energy from fat, 35-60% energy from carbohydrates, less than 200mg  of dietary cholesterol, 20-35 gm of total fiber daily;Understanding of distribution of calorie intake throughout the day with the consumption of 4-5 meals/snacks;Short Term: Continue to assess and modify interventions until short term weight is achieved;Long Term: Adherence to nutrition and physical activity/exercise program aimed toward attainment of established weight goal   Increase Strength and Stamina Yes   Intervention Provide advice, education, support and counseling about physical  activity/exercise needs.;Develop an individualized exercise prescription for aerobic and resistive training based on initial evaluation findings, risk stratification, comorbidities and participant's personal goals.   Expected Outcomes Achievement of increased cardiorespiratory fitness and enhanced flexibility, muscular endurance and strength shown through measurements of functional capacity and personal statement of participant.   Improve shortness of breath with ADL's Yes   Intervention Provide education, individualized exercise plan and daily activity instruction to help decrease symptoms of SOB with activities of daily living.   Expected Outcomes Short Term: Achieves a reduction of symptoms when performing activities of daily living.   Develop more efficient breathing techniques such as purse lipped breathing and diaphragmatic breathing; and practicing self-pacing with activity Yes   Intervention Provide education, demonstration and support about specific breathing techniuqes utilized for more efficient breathing. Include techniques such as pursed lipped breathing, diaphragmatic breathing and self-pacing activity.   Expected Outcomes Short Term: Participant will be able  to demonstrate and use breathing techniques as needed throughout daily activities.   Increase knowledge of respiratory medications and ability to use respiratory devices properly  Yes   Intervention Provide education and demonstration as needed of appropriate use of medications, inhalers, and oxygen therapy.   Expected Outcomes Short Term: Achieves understanding of medications use. Understands that oxygen is a medication prescribed by physician. Demonstrates appropriate use of inhaler and oxygen therapy.      Core Components/Risk Factors/Patient Goals Review:    Core Components/Risk Factors/Patient Goals at Discharge (Final Review):    ITP Comments:   Comments:

## 2015-12-19 ENCOUNTER — Encounter (HOSPITAL_COMMUNITY)
Admission: RE | Admit: 2015-12-19 | Discharge: 2015-12-19 | Disposition: A | Discharge status: Home / Self Care | Payer: Medicare Other | Source: Ambulatory Visit | Attending: Internal Medicine | Admitting: Internal Medicine

## 2015-12-19 DIAGNOSIS — J449 Chronic obstructive pulmonary disease, unspecified: Secondary | ICD-10-CM | POA: Insufficient documentation

## 2015-12-19 NOTE — Progress Notes (Signed)
Patient arrived to his first pulmonary rehab exercise session complaining of dizziness, slight unsteady gait, and "not feeling right". Patient has just increased his wellbutrin from 150mg  to 300mg  4 days ago. Patient states he was up all night every hour feeling as if he needed to have a BM. Large firm BM early this am. Patient states he has not had one since Saturday. A&O. Denies complaint of pain. VSS, BP 164/85, HR 68, O2 sats 96 on RA. Patient feels he is exhausted due to lack of sleep. Patient instructed to see primary care today to discuss symptoms and to see if it could be from the increased dose of wellbutrin. Patient voiced understanding. Patient also instructed on warning s/s. Discharged home with male friend that drove him here. Will follow up with patient later this afternoon. Attempted unsuccessful notification of primary MD. Message left.

## 2015-12-20 ENCOUNTER — Telehealth: Payer: Self-pay | Admitting: Internal Medicine

## 2015-12-20 NOTE — Telephone Encounter (Signed)
error 

## 2015-12-21 ENCOUNTER — Encounter (HOSPITAL_COMMUNITY): Admission: RE | Admit: 2015-12-21 | Payer: Medicare Other | Source: Ambulatory Visit

## 2015-12-25 ENCOUNTER — Ambulatory Visit (INDEPENDENT_AMBULATORY_CARE_PROVIDER_SITE_OTHER): Payer: Medicare Other | Admitting: Internal Medicine

## 2015-12-25 ENCOUNTER — Encounter: Payer: Self-pay | Admitting: Internal Medicine

## 2015-12-25 VITALS — BP 130/74 | HR 68 | Ht 72.0 in | Wt 177.8 lb

## 2015-12-25 DIAGNOSIS — K5900 Constipation, unspecified: Secondary | ICD-10-CM

## 2015-12-25 DIAGNOSIS — J449 Chronic obstructive pulmonary disease, unspecified: Secondary | ICD-10-CM | POA: Diagnosis not present

## 2015-12-25 DIAGNOSIS — G4733 Obstructive sleep apnea (adult) (pediatric): Secondary | ICD-10-CM

## 2015-12-25 NOTE — Assessment & Plan Note (Addendum)
cpap is thru the [place] > not using consistently as of 12/25/2015  s apparent sequelae > f/u [facility] planned

## 2015-12-25 NOTE — Patient Instructions (Addendum)
Try spiriva just one puff each am to see if bloating/constipation improve  and if so leave the dose just at one pff - if not ok try off completely after a few weeks of one pff but if off it makes no difference at all then just restart it at 2 each am as per med calendar   See calendar for specific medication instructions and bring it back for each and every office visit for every healthcare provider you see.  Without it,  you may not receive the best quality medical care that we feel you deserve.  You will note that the calendar groups together  your maintenance  medications that are timed at particular times of the day.  Think of this as your checklist for what your doctor has instructed you to do until your next evaluation to see what benefit  there is  to staying on a consistent group of medications intended to keep you well.  The other group at the bottom is entirely up to you to use as you see fit  for specific symptoms that may arise between visits that require you to treat them on an as needed basis.  Think of this as your action plan or "what if" list.   Separating the top medications from the bottom group is fundamental to providing you adequate care going forward.    Please schedule a follow up visit in 3 months but call sooner if needed

## 2015-12-25 NOTE — Progress Notes (Signed)
Subjective:     Patient ID: Jeremiah [name], male   DOB: Aug 12, 1945   MRN: [id number]    Brief patient profile:  27 yobm quit smoking 1989 with no problems with variable sob x 2004 and maintained on inhalers referred 05/03/2013 by Dr Titus Mould for evaluation of outpt management for copd after hospitalization and found to have GOLD III COPD 01/2014  with tendency to panic when sob.  Admit date: 04/24/2013  Discharge date: 04/27/2013  Discharge Diagnoses:  Active Problems:  COPD exacerbation  Detailed Hospital Course:  70 yo male with abrupt onset of sob (1/10) that progressively worsened until EMS called. Patient intubated in ED due to severe respiratory distress. In route, patient had increasing tachypnea, decline SpO2, and obtundation. Patient with GCS <8 at arrival, evidence of emesis, intubated for severe respiratory distress. Family reported no evidence of fevers, chills, malaise, worsening coughing, and sob. Initial episode passed, however sister received a call later with him reporting a return of symptoms, complaints of inability of breathing and EMS alerted. Distant history of tobacco abuse. Has never been hospitalized for COPD before this episode. He has had no recent hospitalizations, and has never been previously intubated.  He was admitted to the ICU on 1/11 and was treated with the typical modalities for acute respiratory failure (for presumed AECODP) which included mechanical ventilation with sedation, inhaled bronchodilators, antibiotics, and systemic steroid therapy. He improved later that day and was able to be extubated and sedation was stopped. His care since that point has primarily focused on the de-escalation of the therapies mentioned above. On 1/13 he has returned to a near-baseline state of health and has been deemed a candidate for discharge.  Discharge Plan by diagnoses  Acute exacerbation of COPD  Acute hypercarbic respiratory failure (resolved)  plan  -Follow up with Dr. Melvyn Novas  on 05/03/2013 at 2:30PM  -Change steroids to PO Prednisone, taper over 7-10d  -Resume home dose BDs    History of Present Illness  05/03/2013 1st  Pulmonary office visit/ Rawlin Reaume cc more sob since Oct 2014 on symbicort 160 2 bid and spiriva daily  But still  daily use of proventil and no need for neb then needed neb x sev months prior to his admit above but back to baseline since d/c on pred still tapering off but using saba qid, not prn "out of habit" >>return for PFT   01/03/2014 Follow up  [name] .  Complains of increased episodes of increased DOE, wheezing, prod cough with white/clear mucus worse for last week. Family member says he has been progressively get worse over last year. Was admitted 04/2013 w/ COPD flare /VDRF . [name] from last office visit however did not follow up .   Followed at [facility] , says he was told he has Moderate COPD 6 yrs ago. On Symbicort and Spiriva.  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  Says he gets winded easily esp on incline.  Says worse for last week with increased cough and wheezing.  No hemotpysis, wt loss, calf pain, n/v/d, chest pain or fever.  Last abx ~2 months ago.  rec Doxycycline 100mg  Twice daily  For 7 days  Prednisone taper over next week Mucinex DM Twice daily  As needed  Cough /congestion  Continue on Symbicort and Spiriva .    01/24/2014 f/u ov/Shaarav Ripple re:  GOLD III Chief  Complaint  Patient presents with  . Follow-up    PFT done today. Pt states that SOB and cough have improved some, but not back to normal baseline. Cough is prod with minimal clear sputum.   can do HT but no mall due to sob Prednisone really helped a lot but did not maintain improvement once finished >>pred taper add ppi bid and diet     12/12/2014 acute  ov/Lulia Schriner re: ? aecopd /new gi/gu complaints ? From spiriva?   Chief Complaint  Patient presents  with  . Acute Visit    Pt c/o increased SOB and cough x 4 days. Cough is not very prod- clear sputum. He also c/o increased gas and bloating.   downhill since prev ov, changing his own maint rx (dc'd pepcid) >  severe cough at hs onset 12/09/14  On spiriva lots of gi/gu issues  Started pred one day prior to OV  As per calendar action plan  Has only used saba so far in hfa form, has neb but doesn't feel he needs it yet.  >changed spiriva to [place]     04/04/2015  f/u ov/Marua Qin re: copd  Chief Complaint  Patient presents with  . Follow-up    pt following for COPD: pt states hes been its been on and off. pt states 3 weekws ago he was diagnosed with bronchitits but he feels much better. pt c/o a dry cough thats been bothering him mostly in the night time. pt states at times he has to take the CPAP off because he has these couhing fits. no c/o SOB , wheezing, or chest tightness.   has med calendar but not following the action plans at the bottom for cough  > For cough/ congestion > mucinex dm and flutter valve       07/20/2015  f/u ov/Jude Naclerio re: aecopd/ using med calendar though not flutter or approp dose of mucinex dm/ confused with when to use pred  Chief Complaint  Patient presents with  . Follow-up    pt c/o nonprod cough, chest congestion, sob X4-5 days.  S/s worse qhs.    last prednisone finished on 07/16/15 and getting worse again in terms of breathing and dry coughing rec In the event that you need your nebulizer more than rarely for your breathing: prednisone 20 mg daily until better  Then 10 mg daily x 5 days and stop For cough > mucinex dm 1200 mg every 12 hours and use the flutter valve Work on inhaler technique    08/29/2015  f/u ov/Tyjanae Bartek re:  GOLD III  symbicort 160 2bid/ tudorza  Chief Complaint  Patient presents with  . Follow-up    Cough has not improved since the last visit. He uses ventolin at least once per day and neb with albuterol 2 x daily on average.   Still using  oil based vit D, following med calendar better / rarely taking prednisone but helps when does  Lots of hoarseness and dry cough  Day >   >stop [place] and begin Spiriva     11/01/2015 NP  Acute OV  Presents for an acute office visit Patient complains of 2 weeks of increased cough, shortness of breath and wheezing. Feels that the high temperatures and humidity or making his breathing worse. Wears out with minimum activity   Denies any chest congestion/tightness, sinus pressure/drainage, fever, nausea or vomiting.  Has any fever or discolored mucus. Remains on Symbicort and Spiriva. Did start on prednisone over the last 1-2  days. Still has persistent cough and wheezing. Cough is keeping him up at night.. Patient and wife have multiple questions about his severity of his underlying COPD. He went over purse lip breathing. He would like to try pulmonary rehabilitation again. rec Zpack to have on hold if symtpoms worsen with discolored mucus.  Prednisone taper over next week.  Hydromet 1 tsp every 6hr, As needed  Cough , may make sleepy.  Refer to pulmonary rehab    12/25/2015  f/u ov/Susi Goslin re: GOLD III copd/ symb/spiriva - using med calendar well  Chief Complaint  Patient presents with  . Follow-up    Doing well and denies any co's today.   doe [facility] = can't walk a nl pace on a flat grade s sob but does fine slow and flat eg  WM shopping ok p using [facility] parking - rare saba need -has not needed pred recentlys   main complaint is abd distention/ constipation   No obvious day to day or daytime variability or assoc excess/ purulent sputum or mucus plugs or hemoptysis or cp or chest tightness, subjective wheeze or overt sinus or hb symptoms. No unusual exp hx or h/o childhood pna/ asthma or knowledge of premature birth.  Sleeping ok without nocturnal  or early am exacerbation  of respiratory  c/o's or need for noct saba. Also denies any obvious fluctuation of symptoms with weather or environmental  changes or other aggravating or alleviating factors except as outlined above   Current Medications, Allergies, Complete Past Medical History, Past Surgical History, Family History, and Social History were reviewed in [employer] record.  ROS  The following are not active complaints unless bolded sore throat, dysphagia, dental problems, itching, sneezing,  nasal congestion or excess/ purulent secretions, ear ache,   fever, chills, sweats, unintended wt loss, classically pleuritic or exertional cp,  orthopnea pnd or leg swelling, presyncope, palpitations, abdominal pain, anorexia, nausea, vomiting, diarrhea  or change in bowel or bladder habits, change in stools or urine, dysuria,hematuria,  rash, arthralgias, visual complaints, headache, numbness, weakness or ataxia or problems with walking or coordination,  change in mood/affect or memory.            Objective:       Physical Exam  GEN: A/Ox3; pleasant , NAD, elderly , thin very anxious bm nad      07/07/2014        188  > 07/20/2014     182 08/03/2014 >>    11/01/2014  181 > 12/12/2014   177 >182 01/03/2015 >  04/04/2015  183 >04/28/2015 184 > 08/29/2015  181 > 12/25/2015  177   Vital signs reviewed - sats 92% on RA on arrival      HEENT:  [place]/AT,  EACs-clear, TMs-wnl, NOSE-clear, THROAT-clear, no lesions, no postnasal drip or exudate noted.  Top dentures -   NECK:  Supple w/ fair ROM; no JVD; normal carotid impulses w/o bruits; no thyromegaly or nodules palpated; no lymphadenopathy.    RESP     .  no accessory muscle use, no dullness to percussion- distant bs bilaterally with increased Texp  CARD:  RRR, no m/r/g  , no peripheral edema, pulses intact, no cyanosis or clubbing.  GI:   Mod distended but Soft & nt; nml bowel sounds; no organomegaly or masses detected.   Musco: Warm bil, no deformities or joint swelling noted.   Neuro: alert, no focal deficits noted.    Skin: Warm, no lesions or rashes

## 2015-12-26 ENCOUNTER — Encounter (HOSPITAL_COMMUNITY)
Admission: RE | Admit: 2015-12-26 | Discharge: 2015-12-26 | Disposition: A | Discharge status: Home / Self Care | Payer: Medicare Other | Source: Ambulatory Visit | Attending: Internal Medicine | Admitting: Internal Medicine

## 2015-12-26 VITALS — Wt 174.8 lb

## 2015-12-26 DIAGNOSIS — J449 Chronic obstructive pulmonary disease, unspecified: Secondary | ICD-10-CM

## 2015-12-26 NOTE — Progress Notes (Signed)
Daily Session Note  Patient Details  Name: Jeremiah [name] MRN: [id number] Date of Birth: Sep 10, 1945 Referring Provider:   [name] Pulmonary Rehab Walk Test from 12/12/2015 in [place]  Referring Provider  Dr. Melvyn Novas      Encounter Date: 12/26/2015  Check In:     Session Check In - 12/26/15 1018      Check-In   Location [facility]   Staff Present Su Hilt, MS, ACSM RCEP, Exercise Physiologist;Sybilla Malhotra Ysidro Evert, RN;Portia Rollene Rotunda, Therapist, sports, BSN;Ramon Dredge, RN, MHA   Physician(s) Dr. Marily Memos   Medication changes reported     No   Fall or balance concerns reported    No   Warm-up and Cool-down Not performed (comment)   Resistance Training Performed Yes   VAD Patient? No     Pain Assessment   Currently in Pain? No/denies   Multiple Pain Sites No      Capillary Blood Glucose: No results found for this or any previous visit (from the past 24 hour(s)).      Exercise Prescription Changes - 12/26/15 1200      Response to Exercise   Blood Pressure (Admit) 100/68   Blood Pressure (Exercise) 122/74   Blood Pressure (Exit) 100/68   Heart Rate (Admit) 54 bpm   Heart Rate (Exercise) 67 bpm   Heart Rate (Exit) 59 bpm   Oxygen Saturation (Admit) 95 %   Oxygen Saturation (Exercise) 92 %   Oxygen Saturation (Exit) 94 %   Rating of Perceived Exertion (Exercise) 13   Perceived Dyspnea (Exercise) 1   Duration Progress to 45 minutes of aerobic exercise without signs/symptoms of physical distress   Intensity THRR unchanged     Progression   Progression Continue to progress workloads to maintain intensity without signs/symptoms of physical distress.     Resistance Training   Training Prescription Yes   Weight blue bandas   Reps 10-12  10 minutes of strenght training     Interval Training   Interval Training No     Treadmill   MPH 2   Grade 0   Minutes 17     NuStep   Level 3   Minutes 17     Rower   Level 2   Minutes 17     Goals Met:  Exercise tolerated well No report of cardiac concerns or symptoms Strength training completed today  Goals Unmet:  Not Applicable  Comments: Service time is from 1030 to 1210    Dr. Rush Farmer is Medical Director for Pulmonary Rehab at [facility].

## 2015-12-26 NOTE — Progress Notes (Signed)
S: SOB on exertion but no other complaints.  O:  Chronically ill appearing male, NAD VSS-AF Hrt: RRR, Nl S1/S2, -M/R/G. Lung: CTA bilaterally. Abd: Soft, NT, ND and +BS. Ext: No edema and no tendernes. Neuro: Intact  A/P: 70 year old male with COPD who quit smoking, sees Dr. Melvyn Novas in the office, was a part of the rehab program but stopped exercising due to life issues and now coming back.  I see no obstacles to him starting to exercise.  Will begin today per the usual protocol.  Rush Farmer, M.D. [facility] Pulmonary/Critical Care Medicine. Pager: [phone]. After hours pager: [phone].

## 2015-12-27 DIAGNOSIS — F4312 Post-traumatic stress disorder, chronic: Secondary | ICD-10-CM | POA: Diagnosis not present

## 2015-12-27 DIAGNOSIS — F411 Generalized anxiety disorder: Secondary | ICD-10-CM | POA: Diagnosis not present

## 2015-12-27 DIAGNOSIS — F331 Major depressive disorder, recurrent, moderate: Secondary | ICD-10-CM | POA: Diagnosis not present

## 2015-12-28 ENCOUNTER — Encounter (HOSPITAL_COMMUNITY)
Admission: RE | Admit: 2015-12-28 | Discharge: 2015-12-28 | Disposition: A | Discharge status: Home / Self Care | Payer: Medicare Other | Source: Ambulatory Visit | Attending: Internal Medicine | Admitting: Internal Medicine

## 2015-12-28 ENCOUNTER — Telehealth: Payer: Self-pay | Admitting: Internal Medicine

## 2015-12-28 VITALS — Wt 178.6 lb

## 2015-12-28 DIAGNOSIS — R059 Cough, unspecified: Secondary | ICD-10-CM

## 2015-12-28 DIAGNOSIS — J449 Chronic obstructive pulmonary disease, unspecified: Secondary | ICD-10-CM

## 2015-12-28 DIAGNOSIS — R05 Cough: Secondary | ICD-10-CM

## 2015-12-28 MED ORDER — BENZONATATE 100 MG PO CAPS: 100.0000 mg | ORAL_CAPSULE | Freq: Three times a day (TID) | ORAL | 0 refills | Status: DC | PRN

## 2015-12-28 NOTE — Telephone Encounter (Signed)
Ok to refill tessalon and follow the action plan at the bottom of his med calendar to the letter - I believe it includes prednisone  X 6 days and if not ok to send in refills for Prednisone 10 mg take  4 each am x 2 days,   2 each am x 2 days,  1 each am x 2 days and stop x 5 refills

## 2015-12-28 NOTE — Telephone Encounter (Signed)
Pt is aware of MW's recommendation. Rx for Lavella Lemons has been sent in. Nothing further was needed.

## 2015-12-28 NOTE — Progress Notes (Signed)
Daily Session Note  Patient Details  Name: Jeremiah [name] MRN: [id number] Date of Birth: [birth year] Referring Provider:   [name] Pulmonary Rehab Walk Test from 12/12/2015 in [place]  Referring Provider  Dr. Melvyn Novas      Encounter Date: 12/28/2015  Check In:     Session Check In - 12/28/15 1026      Check-In   Location [facility]   Staff Present Rosebud Poles, RN, BSN;Leverne Amrhein, MS, ACSM RCEP, Exercise Physiologist;[name] Ysidro Evert, Felipe Drone, RN, MHA;Portia Rollene Rotunda, RN, BSN   Supervising physician immediately available to respond to emergencies [facility] immediately available   Physician(s) Dr. Waldron Labs   Medication changes reported     No   Fall or balance concerns reported    No   Warm-up and Cool-down Performed as group-led instruction   Resistance Training Performed Yes   VAD Patient? No     Pain Assessment   Currently in Pain? No/denies   Multiple Pain Sites No      Capillary Blood Glucose: No results found for this or any previous visit (from the past 24 hour(s)).      Exercise Prescription Changes - 12/28/15 1200      Exercise Review   Progression Yes     Response to Exercise   Blood Pressure (Admit) 100/64   Blood Pressure (Exercise) 132/88   Blood Pressure (Exit) 118/60   Heart Rate (Admit) 49 bpm   Heart Rate (Exercise) 59 bpm   Heart Rate (Exit) 55 bpm   Oxygen Saturation (Admit) 97 %   Oxygen Saturation (Exercise) 97 %   Oxygen Saturation (Exit) 94 %   Rating of Perceived Exertion (Exercise) 13   Perceived Dyspnea (Exercise) 2   Duration Progress to 45 minutes of aerobic exercise without signs/symptoms of physical distress   Intensity THRR unchanged     Progression   Progression Continue to progress workloads to maintain intensity without signs/symptoms of physical distress.     Resistance Training   Training Prescription Yes   Weight bands   Reps 10-12  10 minutes  of strenght training     Interval Training   Interval Training No     NuStep   Level 4   Minutes 17   METs 2.6     Rower   Level 2   Minutes 17     Goals Met:  Exercise tolerated well No report of cardiac concerns or symptoms Strength training completed today  Goals Unmet:  Not Applicable  Comments: Service time is from 10:30am to 12:05pm     Dr. Rush Farmer is Medical Director for Pulmonary Rehab at [facility]

## 2015-12-28 NOTE — Telephone Encounter (Signed)
Spoke with pt and he states that cough is getting worse. Pt c/o dry cough that is worse at night and is keeping his wife awake. Pt is requesting Hydromet and Tessalon refills. Pt has tried sugar free hard candy and Delsym but they are not helping. Pt denies ShOB/wheeze or f/n/v.   MW - Please advise. Thanks!  LOV  12/25/15  Patient Instructions    Try spiriva just one puff each am to see if bloating/constipation improve  and if so leave the dose just at one pff - if not ok try off completely after a few weeks of one pff but if off it makes no difference at all then just restart it at 2 each am as per med calendar   See calendar for specific medication instructions and bring it back for each and every office visit for every healthcare provider you see.  Without it,  you may not receive the best quality medical care that we feel you deserve.  You will note that the calendar groups together  your maintenance  medications that are timed at particular times of the day.  Think of this as your checklist for what your doctor has instructed you to do until your next evaluation to see what benefit  there is  to staying on a consistent group of medications intended to keep you well.  The other group at the bottom is entirely up to you to use as you see fit  for specific symptoms that may arise between visits that require you to treat them on an as needed basis.  Think of this as your action plan or "what if" list.   Separating the top medications from the bottom group is fundamental to providing you adequate care going forward.    Please schedule a follow up visit in 3 months but call sooner if needed

## 2015-12-28 NOTE — Progress Notes (Signed)
Pulmonary Individual Treatment Plan  Patient Details  Name: Jeremiah [name] MRN: [id number] Date of Birth: January 01, 1946 Referring Provider:   [name] Pulmonary Rehab Walk Test from 12/12/2015 in [place]  Referring Provider  Dr. Melvyn Novas      Initial Encounter Date:  Flowsheet Row Pulmonary Rehab Walk Test from 12/12/2015 in [place]  Date  12/12/15  Referring Provider  Dr. Melvyn Novas      Visit Diagnosis: Chronic obstructive pulmonary disease, unspecified COPD type ([place])  Patient's Home Medications on Admission:   Current Outpatient Prescriptions:  .  albuterol (PROVENTIL HFA;VENTOLIN HFA) 108 (90 BASE) MCG/ACT inhaler, Inhale 2 puffs into the lungs every 4 (four) hours as needed for wheezing or shortness of breath (((PLAN B)))., Disp: 1 Inhaler, Rfl: 5 .  albuterol (PROVENTIL) (2.5 MG/3ML) 0.083% nebulizer solution, Take 3 mLs by nebulization every 4 (four) hours as needed for wheezing or shortness of breath (((PLAN C))). , Disp: , Rfl:  .  ALPRAZolam (XANAX) 0.5 MG tablet, TAKE 1 TABLET BY MOUTH TWICE A DAY AS NEEDED FOR ANXIETY, Disp: 60 tablet, Rfl: 1 .  atenolol-chlorthalidone (TENORETIC) 50-25 MG tablet, Take 1 tablet by mouth at bedtime., Disp: , Rfl: 11 .  B-D 3CC LUER-LOK SYR 21GX1-1/2 21G X 1-1/2" 3 ML MISC, USE NEEDLE/SYRINGE TO INJECT TESTOSTERONE INTO SKIN ONCE EVERY 2 WEEKS., Disp: , Rfl: 0 .  benzonatate (TESSALON) 100 MG capsule, Take 1 capsule (100 mg total) by mouth 3 (three) times daily as needed for cough. (Patient not taking: Reported on 12/08/2015), Disp: 30 capsule, Rfl: 0 .  budesonide-formoterol (SYMBICORT) 160-4.5 MCG/ACT inhaler, Inhale 2 puffs into the lungs 2 (two) times daily., Disp: , Rfl:  .  CARAFATE 1 GM/10ML suspension, TAKE 10 MLS (1 GRAM TOTAL) BY MOUTH 4 TIMES A DAY, Disp: , Rfl: 1 .  cetirizine (ZYRTEC) 10 MG tablet, Take 10 mg by mouth at bedtime as needed (drainage). , Disp: , Rfl:  .   Cholecalciferol (VITAMIN D3) 2000 units TABS, Take 1 tablet by mouth every morning., Disp: , Rfl:  .  dextromethorphan (DELSYM) 30 MG/5ML liquid, Take 2 tsp twice daily as needed with flutter valve still coughing, Disp: , Rfl:  .  dextromethorphan-guaiFENesin (MUCINEX DM) 30-600 MG per 12 hr tablet, Take 84ml every 4 hours as needed with flutter valve for cough and congestion, Disp: , Rfl:  .  esomeprazole (NEXIUM) 40 MG capsule, Take 40 mg by mouth daily before breakfast. , Disp: , Rfl: 2 .  famotidine (PEPCID) 20 MG tablet, Take 20 mg by mouth at bedtime., Disp: , Rfl:  .  fluticasone (FLONASE) 50 MCG/ACT nasal spray, Place 2 sprays into both nostrils 2 (two) times daily., Disp: , Rfl:  .  hydrochlorothiazide (HYDRODIURIL) 25 MG tablet, Take 25 mg by mouth every morning. , Disp: , Rfl:  .  HYDROcodone-homatropine (HYDROMET) 5-1.5 MG/5ML syrup, Take 5 mLs by mouth every 6 (six) hours as needed. (Patient not taking: Reported on 12/25/2015), Disp: 240 mL, Rfl: 0 .  magnesium oxide (MAG-OX) 400 MG tablet, Take 400 mg by mouth 2 (two) times daily. For leg cramps, Disp: , Rfl:  .  Multiple Vitamin (CALCIUM COMPLEX PO), Take 1 tablet by mouth daily., Disp: , Rfl:  .  naproxen sodium (ANAPROX) 220 MG tablet, Per bottle as needed for joint pain, Disp: , Rfl:  .  NON FORMULARY, Use CPAP at bedtime, Disp: , Rfl:  .  NON FORMULARY, PeriodGard  Take  1 tsp and swish and spit, Disp: , Rfl:  .  nystatin (MYCOSTATIN) 100000 UNIT/ML suspension, Take 5 mLs (500,000 Units total) by mouth 4 (four) times daily. Take until your thrush is gone. Swish & swallow medication. (Patient not taking: Reported on 12/25/2015), Disp: 60 mL, Rfl: 0 .  Polyvinyl Alcohol (LIQUIFILM TEARS OP), Reported on 09/13/2015, Disp: , Rfl:  .  predniSONE (DELTASONE) 10 MG tablet, Taper as directed if breathing worsens, Disp: , Rfl: 0 .  predniSONE (DELTASONE) 10 MG tablet, 4 tabs for 2 days, then 3 tabs for 2 days, 2 tabs for 2 days, then 1 tab for  2 days, then stop (Patient not taking: Reported on 12/08/2015), Disp: 20 tablet, Rfl: 0 .  Respiratory Therapy Supplies (FLUTTER) DEVI, Use as directed, Disp: 1 each, Rfl: 0 .  Simethicone (GAS-X PO), Take as directed for gas, Disp: , Rfl:  .  testosterone cypionate (DEPOTESTOSTERONE CYPIONATE) 200 MG/ML injection, INJECT 1 ML (200 MG TOTAL) INTO THE MUSCLE EVERY 14 (FOURTEEN) DAYS., Disp: , Rfl: 0 .  Tiotropium Bromide Monohydrate (SPIRIVA RESPIMAT) 2.5 MCG/ACT AERS, 2 each am, Disp: 1 Inhaler, Rfl: 11  Past Medical History: Past Medical History:  Diagnosis Date  . COPD (chronic obstructive pulmonary disease) ([place])   . Depression   . Hypertension   . PTSD (post-traumatic stress disorder)   . Sleep apnea     Tobacco Use: History  Smoking Status  . Former Smoker  . Packs/day: 1.00  . Years: 30.00  . Types: Cigarettes  . Quit date: 04/16/1987  Smokeless Tobacco  . Never Used    Labs: Recent Review Flowsheet Data    Labs for ITP Cardiac and Pulmonary Rehab Latest Ref Rng & Units 04/24/2013 04/24/2013 04/25/2013   Trlycerides <150 mg/dL - - 29   PHART 7.350 - 7.450 7.101(LL) 7.175(LL) 7.440   PCO2ART 35.0 - 45.0 mmHg 85.1(HH) 61.4(HH) 30.2(L)   HCO3 20.0 - 24.0 mEq/L 27.4(H) 23.2 20.2   TCO2 0 - 100 mmol/L 30 25 21.1   ACIDBASEDEF 0.0 - 2.0 mmol/L 6.0(H) 7.0(H) 3.3(H)   O2SAT % 100.0 100.0 98.9      Capillary Blood Glucose: Lab Results  Component Value Date   GLUCAP 144 (H) 04/27/2013   GLUCAP 97 04/27/2013   GLUCAP 101 (H) 04/27/2013   GLUCAP 119 (H) 04/27/2013   GLUCAP 130 (H) 04/27/2013     ADL UCSD:   Pulmonary Function Assessment:     Pulmonary Function Assessment - 12/08/15 1035      Breath   Bilateral Breath Sounds Wheezes  mild wheezing heard in upper lobes bilat   Shortness of Breath Yes;Fear of Shortness of Breath;Limiting activity      Exercise Target Goals:    Exercise Program Goal: Individual exercise prescription set with THRR, safety &  activity barriers. Participant demonstrates ability to understand and report RPE using BORG scale, to self-measure pulse accurately, and to acknowledge the importance of the exercise prescription.  Exercise Prescription Goal: Starting with aerobic activity 30 plus minutes a day, 3 days per week for initial exercise prescription. Provide home exercise prescription and guidelines that participant acknowledges understanding prior to discharge.  Activity Barriers & Risk Stratification:     Activity Barriers & Cardiac Risk Stratification - 12/08/15 1034      Activity Barriers & Cardiac Risk Stratification   Activity Barriers Shortness of Breath;Deconditioning      6 Minute Walk:     6 Minute Walk    Row Name 12/14/15 [phone]  6 Minute Walk   Phase Initial     Distance 1275 feet     Walk Time 6 minutes     # of Rest Breaks 0     MPH 2.41     METS 2.84     RPE 11     Perceived Dyspnea  1     Symptoms No     Resting HR 59 bpm     Resting BP 124/80     Max Ex. HR 74 bpm     Max Ex. BP 134/80       Interval HR   Baseline HR 59     1 Minute HR 69     2 Minute HR 73     3 Minute HR 73     4 Minute HR 74     5 Minute HR 74     6 Minute HR 74     2 Minute Post HR 67     Interval Heart Rate? Yes       Interval Oxygen   Interval Oxygen? Yes     Baseline Oxygen Saturation % 95 %     Baseline Liters of Oxygen 0 L     1 Minute Oxygen Saturation % 94 %     1 Minute Liters of Oxygen 0 L     2 Minute Oxygen Saturation % 94 %     2 Minute Liters of Oxygen 0 L     3 Minute Oxygen Saturation % 94 %     3 Minute Liters of Oxygen 0 L     4 Minute Oxygen Saturation % 94 %     4 Minute Liters of Oxygen 0 L     5 Minute Oxygen Saturation % 94 %     5 Minute Liters of Oxygen 0 L     6 Minute Oxygen Saturation % 94 %     6 Minute Liters of Oxygen 0 L     2 Minute Post Oxygen Saturation % 95 %     2 Minute Post Liters of Oxygen 0 L        Initial Exercise Prescription:      Initial Exercise Prescription - 12/14/15 0700      Date of Initial Exercise RX and Referring Provider   Date 12/12/15   Referring Provider Dr. Melvyn Novas     Treadmill   MPH 2   Grade 0   Minutes 17     NuStep   Level 3   Minutes 17   METs 1.5     Rower   Level 2   Minutes 17     Prescription Details   Frequency (times per week) 2   Duration Progress to 45 minutes of aerobic exercise without signs/symptoms of physical distress     Intensity   THRR 40-80% of Max Heartrate 68-122   Ratings of Perceived Exertion 11-13   Perceived Dyspnea 0-4     Progression   Progression Continue progressive overload as per policy without signs/symptoms or physical distress.     Resistance Training   Training Prescription Yes   Weight blue bands   Reps 10-12      Perform Capillary Blood Glucose checks as needed.  Exercise Prescription Changes:     Exercise Prescription Changes    Row Name 12/26/15 1200             Response to Exercise   Blood Pressure (Admit) 100/68  Blood Pressure (Exercise) 122/74       Blood Pressure (Exit) 100/68       Heart Rate (Admit) 54 bpm       Heart Rate (Exercise) 67 bpm       Heart Rate (Exit) 59 bpm       Oxygen Saturation (Admit) 95 %       Oxygen Saturation (Exercise) 92 %       Oxygen Saturation (Exit) 94 %       Rating of Perceived Exertion (Exercise) 13       Perceived Dyspnea (Exercise) 1       Duration Progress to 45 minutes of aerobic exercise without signs/symptoms of physical distress       Intensity THRR unchanged         Progression   Progression Continue to progress workloads to maintain intensity without signs/symptoms of physical distress.         Resistance Training   Training Prescription Yes       Weight blue bandas       Reps 10-12  10 minutes of strenght training         Interval Training   Interval Training No         Treadmill   MPH 2       Grade 0       Minutes 17         NuStep   Level 3        Minutes 17         Rower   Level 2       Minutes 17          Exercise Comments:   Discharge Exercise Prescription (Final Exercise Prescription Changes):     Exercise Prescription Changes - 12/26/15 1200      Response to Exercise   Blood Pressure (Admit) 100/68   Blood Pressure (Exercise) 122/74   Blood Pressure (Exit) 100/68   Heart Rate (Admit) 54 bpm   Heart Rate (Exercise) 67 bpm   Heart Rate (Exit) 59 bpm   Oxygen Saturation (Admit) 95 %   Oxygen Saturation (Exercise) 92 %   Oxygen Saturation (Exit) 94 %   Rating of Perceived Exertion (Exercise) 13   Perceived Dyspnea (Exercise) 1   Duration Progress to 45 minutes of aerobic exercise without signs/symptoms of physical distress   Intensity THRR unchanged     Progression   Progression Continue to progress workloads to maintain intensity without signs/symptoms of physical distress.     Resistance Training   Training Prescription Yes   Weight blue bandas   Reps 10-12  10 minutes of strenght training     Interval Training   Interval Training No     Treadmill   MPH 2   Grade 0   Minutes 17     NuStep   Level 3   Minutes 17     Rower   Level 2   Minutes 17       Nutrition:  Target Goals: Understanding of nutrition guidelines, daily intake of sodium 1500mg , cholesterol 200mg , calories 30% from fat and 7% or less from saturated fats, daily to have 5 or more servings of fruits and vegetables.  Biometrics:     Pre Biometrics - 12/08/15 1036      Pre Biometrics   Grip Strength 25 kg       Nutrition Therapy Plan and Nutrition Goals:   Nutrition Discharge: Rate Your Plate Scores:  Psychosocial: Target Goals: Acknowledge presence or absence of depression, maximize coping skills, provide positive support system. Participant is able to verbalize types and ability to use techniques and skills needed for reducing stress and depression.  Initial Review & Psychosocial Screening:     Initial Psych  Review & Screening - 12/08/15 1045      Initial Review   Current issues with Current Stress Concerns   Comments family relationships     Family Dynamics   Good Support System? Yes     Barriers   Psychosocial barriers to participate in program There are no identifiable barriers or psychosocial needs.     Screening Interventions   Interventions Encouraged to exercise      Quality of Life Scores:   PHQ-9: Recent Review Flowsheet Data    Depression screen [facility] 2/9 12/08/2015 12/28/2014 02/14/2014   Decreased Interest 0 0 0   Down, Depressed, Hopeless 0 0 0   PHQ - 2 Score 0 0 0      Psychosocial Evaluation and Intervention:     Psychosocial Evaluation - 12/08/15 1046      Psychosocial Evaluation & Interventions   Interventions Encouraged to exercise with the program and follow exercise prescription      Psychosocial Re-Evaluation:  Education: Education Goals: Education classes will be provided on a weekly basis, covering required topics. Participant will state understanding/return demonstration of topics presented.  Learning Barriers/Preferences:     Learning Barriers/Preferences - 12/08/15 1035      Learning Barriers/Preferences   Learning Barriers None   Learning Preferences Computer/Internet;Group Instruction;Individual Instruction;Verbal Instruction;Written Material      Education Topics: Risk Factor Reduction:  -Group instruction that is supported by a PowerPoint presentation. Instructor discusses the definition of a risk factor, different risk factors for pulmonary disease, and how the heart and lungs work together.     Nutrition for Pulmonary Patient:  -Group instruction provided by PowerPoint slides, verbal discussion, and written materials to support subject matter. The instructor gives an explanation and review of healthy diet recommendations, which includes a discussion on weight management, recommendations for fruit and vegetable consumption, as well  as protein, fluid, caffeine, fiber, sodium, sugar, and alcohol. Tips for eating when patients are short of breath are discussed.   Pursed Lip Breathing:  -Group instruction that is supported by demonstration and informational handouts. Instructor discusses the benefits of pursed lip and diaphragmatic breathing and detailed demonstration on how to preform both.     Oxygen Safety:  -Group instruction provided by PowerPoint, verbal discussion, and written material to support subject matter. There is an overview of "What is Oxygen" and "Why do we need it".  Instructor also reviews how to create a safe environment for oxygen use, the importance of using oxygen as prescribed, and the risks of noncompliance. There is a brief discussion on traveling with oxygen and resources the patient may utilize.   Oxygen Equipment:  -Group instruction provided by [facility] Staff utilizing handouts, written materials, and equipment demonstrations.   Signs and Symptoms:  -Group instruction provided by written material and verbal discussion to support subject matter. Warning signs and symptoms of infection, stroke, and heart attack are reviewed and when to call the physician/911 reinforced. Tips for preventing the spread of infection discussed.   Advanced Directives:  -Group instruction provided by verbal instruction and written material to support subject matter. Instructor reviews Advanced Directive laws and proper instruction for filling out document.   Pulmonary Video:  -Group video education that reviews  the importance of medication and oxygen compliance, exercise, good nutrition, pulmonary hygiene, and pursed lip and diaphragmatic breathing for the pulmonary patient.   Exercise for the Pulmonary Patient:  -Group instruction that is supported by a PowerPoint presentation. Instructor discusses benefits of exercise, core components of exercise, frequency, duration, and intensity of an exercise routine,  importance of utilizing pulse oximetry during exercise, safety while exercising, and options of places to exercise outside of rehab.     Pulmonary Medications:  -Verbally interactive group education provided by instructor with focus on inhaled medications and proper administration.   Anatomy and Physiology of the Respiratory System and Intimacy:  -Group instruction provided by PowerPoint, verbal discussion, and written material to support subject matter. Instructor reviews respiratory cycle and anatomical components of the respiratory system and their functions. Instructor also reviews differences in obstructive and restrictive respiratory diseases with examples of each. Intimacy, Sex, and Sexuality differences are reviewed with a discussion on how relationships can change when diagnosed with pulmonary disease. Common sexual concerns are reviewed.   Knowledge Questionnaire Score:   Core Components/Risk Factors/Patient Goals at Admission:     Personal Goals and Risk Factors at Admission - 12/08/15 1036      Core Components/Risk Factors/Patient Goals on Admission    Weight Management Weight Loss;Yes   Admit Weight 185 lb (83.9 kg)   Goal Weight: Long Term 170 lb (77.1 kg)   Expected Outcomes Weight Loss: Understanding of general recommendations for a balanced deficit meal plan, which promotes 1-2 lb weight loss per week and includes a negative energy balance of [phone] kcal/d;Understanding recommendations for meals to include 15-35% energy as protein, 25-35% energy from fat, 35-60% energy from carbohydrates, less than 200mg  of dietary cholesterol, 20-35 gm of total fiber daily;Understanding of distribution of calorie intake throughout the day with the consumption of 4-5 meals/snacks;Short Term: Continue to assess and modify interventions until short term weight is achieved;Long Term: Adherence to nutrition and physical activity/exercise program aimed toward attainment of established weight goal    Increase Strength and Stamina Yes   Intervention Provide advice, education, support and counseling about physical activity/exercise needs.;Develop an individualized exercise prescription for aerobic and resistive training based on initial evaluation findings, risk stratification, comorbidities and participant's personal goals.   Expected Outcomes Achievement of increased cardiorespiratory fitness and enhanced flexibility, muscular endurance and strength shown through measurements of functional capacity and personal statement of participant.   Improve shortness of breath with ADL's Yes   Intervention Provide education, individualized exercise plan and daily activity instruction to help decrease symptoms of SOB with activities of daily living.   Expected Outcomes Short Term: Achieves a reduction of symptoms when performing activities of daily living.   Develop more efficient breathing techniques such as purse lipped breathing and diaphragmatic breathing; and practicing self-pacing with activity Yes   Intervention Provide education, demonstration and support about specific breathing techniuqes utilized for more efficient breathing. Include techniques such as pursed lipped breathing, diaphragmatic breathing and self-pacing activity.   Expected Outcomes Short Term: Participant will be able to demonstrate and use breathing techniques as needed throughout daily activities.   Increase knowledge of respiratory medications and ability to use respiratory devices properly  Yes   Intervention Provide education and demonstration as needed of appropriate use of medications, inhalers, and oxygen therapy.   Expected Outcomes Short Term: Achieves understanding of medications use. Understands that oxygen is a medication prescribed by physician. Demonstrates appropriate use of inhaler and oxygen therapy.  Core Components/Risk Factors/Patient Goals Review:    Core Components/Risk Factors/Patient Goals at  Discharge (Final Review):    ITP Comments:   Comments: Patient has only attended one session since admission. Will continue to monitor his progression with pulmonary rehab goals over the next 30 days.

## 2016-01-01 ENCOUNTER — Other Ambulatory Visit: Payer: Self-pay | Admitting: Adult Health

## 2016-01-01 DIAGNOSIS — K5900 Constipation, unspecified: Secondary | ICD-10-CM | POA: Insufficient documentation

## 2016-01-01 NOTE — Assessment & Plan Note (Signed)
This may be a significant problem in that impacts Cabd and reduces diaphragm mobility so rec otc rx and try on reduced spiriva rx > f/u GI prn

## 2016-01-01 NOTE — Assessment & Plan Note (Signed)
-   PFTs 01/24/2014  FEV1  1.30 (35%) ratio 49 with severe air trapping and no better p saba and dlco 48% -01/24/2014 p extensive coaching HFA effectiveness =    90%  - referred to rehab 01/24/14  -Med calendar 02/21/2014 > did not recognize it when reprinted 03/29/14 > reviewed , 08/03/2014 , 04/28/2015   - 07/07/14 rec short course prednisone for flares built into action plan  - d/c spiriva 12/12/2014 > trial of tudorza due to gi/gu side effects - 07/20/2015  p extensive coaching HFA effectiveness =    90% from a baseline 75%  - 07/20/2015 changed prednisone to 20 mg per day until better, then taper off with trigger by need for neb - 08/29/2015 changed tudorza to spiriva respimat  09/12/2015 med calendar - 12/25/2015 rec wean spiriva to see what effect if any has on abd distention   Despite severe dz rel well compensated on present complex rx  I had an extended discussion with the patient reviewing all relevant studies completed to date and  lasting 15 to 20 minutes of a 25 minute visit    Each maintenance medication was reviewed in detail including most importantly the difference between maintenance and prns and under what circumstances the prns are to be triggered using an action plan format that is not reflected in the computer generated alphabetically organized AVS but trather by a customized med calendar that reflects the AVS meds with confirmed 100% correlation.   Please see instructions for details which were reviewed in writing and the patient given a copy highlighting the part that I personally wrote and discussed at today's ov.

## 2016-01-02 ENCOUNTER — Encounter (HOSPITAL_COMMUNITY)
Admission: RE | Admit: 2016-01-02 | Discharge: 2016-01-02 | Disposition: A | Discharge status: Home / Self Care | Payer: Medicare Other | Source: Ambulatory Visit | Attending: Internal Medicine | Admitting: Internal Medicine

## 2016-01-02 VITALS — Wt 175.9 lb

## 2016-01-02 DIAGNOSIS — J449 Chronic obstructive pulmonary disease, unspecified: Secondary | ICD-10-CM | POA: Diagnosis not present

## 2016-01-02 DIAGNOSIS — J438 Other emphysema: Secondary | ICD-10-CM

## 2016-01-02 NOTE — Progress Notes (Signed)
Daily Session Note  Patient Details  Name: Jeremiah [name] MRN: [id number] Date of Birth: [birth year] Referring Provider:   [name] Pulmonary Rehab Walk Test from 12/12/2015 in [place]  Referring Provider  Dr. Melvyn Novas      Encounter Date: 01/02/2016  Check In:     Session Check In - 01/02/16 1030      Check-In   Location [facility]   Staff Present Rosebud Poles, RN, BSN;Tristin Gladman, MS, ACSM RCEP, Exercise Physiologist;[name] Ysidro Evert, RN;Portia Rollene Rotunda, RN, BSN   Supervising physician immediately available to respond to emergencies [facility] immediately available   Physician(s) Dr. Waldron Labs   Medication changes reported     No   Fall or balance concerns reported    No   Warm-up and Cool-down Performed as group-led instruction   Resistance Training Performed Yes   VAD Patient? No     Pain Assessment   Currently in Pain? No/denies   Multiple Pain Sites No      Capillary Blood Glucose: No results found for this or any previous visit (from the past 24 hour(s)).      Exercise Prescription Changes - 01/02/16 1200      Response to Exercise   Blood Pressure (Admit) 136/74   Blood Pressure (Exercise) 132/70   Blood Pressure (Exit) 114/60   Heart Rate (Admit) 70 bpm   Heart Rate (Exercise) 80 bpm   Heart Rate (Exit) 68 bpm   Oxygen Saturation (Admit) 98 %   Oxygen Saturation (Exercise) 91 %   Oxygen Saturation (Exit) 96 %   Rating of Perceived Exertion (Exercise) 15   Perceived Dyspnea (Exercise) 3   Duration Progress to 45 minutes of aerobic exercise without signs/symptoms of physical distress   Intensity THRR unchanged     Progression   Progression Continue to progress workloads to maintain intensity without signs/symptoms of physical distress.     Resistance Training   Training Prescription Yes   Weight bands   Reps 10-12  10 minutes of strenght training     Interval Training   Interval Training No     Treadmill   MPH 2   Grade 0   Minutes 17     NuStep   Level 4   Minutes 17   METs 2.4     Rower   Level 2   Minutes 17     Goals Met:  Exercise tolerated well No report of cardiac concerns or symptoms Strength training completed today  Goals Unmet:  Not Applicable  Comments: Service time is from 10:30AM to 12:10PM    Dr. Rush Farmer is Medical Director for Pulmonary Rehab at [facility].

## 2016-01-04 ENCOUNTER — Encounter (HOSPITAL_COMMUNITY)
Admission: RE | Admit: 2016-01-04 | Discharge: 2016-01-04 | Disposition: A | Discharge status: Home / Self Care | Payer: Medicare Other | Source: Ambulatory Visit | Attending: Internal Medicine | Admitting: Internal Medicine

## 2016-01-04 VITALS — Wt 175.3 lb

## 2016-01-04 DIAGNOSIS — J438 Other emphysema: Secondary | ICD-10-CM

## 2016-01-04 DIAGNOSIS — J449 Chronic obstructive pulmonary disease, unspecified: Secondary | ICD-10-CM | POA: Diagnosis not present

## 2016-01-04 NOTE — Progress Notes (Signed)
Daily Session Note  Patient Details  Name: Jeremiah [name] MRN: [id number] Date of Birth: [birth year] Referring Provider:   [name] Pulmonary Rehab Walk Test from 12/12/2015 in [place]  Referring Provider  Dr. Melvyn Novas      Encounter Date: 01/04/2016  Check In:     Session Check In - 01/04/16 1026      Check-In   Location [facility]   Staff Present Su Hilt, MS, ACSM RCEP, Exercise Physiologist;Marlies Ligman Leonia Reeves, RN, BSN;[name] Ysidro Evert, RN;Portia Rollene Rotunda, RN, BSN   Supervising physician immediately available to respond to emergencies [facility] immediately available   Physician(s) Dr. Dyann Kief   Medication changes reported     No   Fall or balance concerns reported    No   Warm-up and Cool-down Performed as group-led instruction   Resistance Training Performed Yes   VAD Patient? No     Pain Assessment   Currently in Pain? No/denies   Multiple Pain Sites No      Capillary Blood Glucose: No results found for this or any previous visit (from the past 24 hour(s)).      Exercise Prescription Changes - 01/04/16 1300      Response to Exercise   Blood Pressure (Admit) 140/72   Blood Pressure (Exercise) 124/72   Blood Pressure (Exit) 110/74   Heart Rate (Admit) 62 bpm   Heart Rate (Exercise) 71 bpm   Heart Rate (Exit) 63 bpm   Oxygen Saturation (Admit) 95 %   Oxygen Saturation (Exercise) 95 %   Oxygen Saturation (Exit) 96 %   Rating of Perceived Exertion (Exercise) 15   Perceived Dyspnea (Exercise) 2   Duration Progress to 45 minutes of aerobic exercise without signs/symptoms of physical distress   Intensity THRR unchanged     Progression   Progression Continue to progress workloads to maintain intensity without signs/symptoms of physical distress.     Resistance Training   Training Prescription Yes   Weight bands   Reps 10-12  10 minutes of strength training     Interval Training   Interval Training No     NuStep   Level 4   Minutes 17   METs 3.4     Rower   Level 2   Minutes 17     Goals Met:  Exercise tolerated well Strength training completed today  Goals Unmet:  Not Applicable  Comments: Service time is from 1030 to 1225    Dr. Rush Farmer is Medical Director for Pulmonary Rehab at [facility].

## 2016-01-09 ENCOUNTER — Encounter (HOSPITAL_COMMUNITY)
Admission: RE | Admit: 2016-01-09 | Discharge: 2016-01-09 | Disposition: A | Discharge status: Home / Self Care | Payer: Medicare Other | Source: Ambulatory Visit | Attending: Internal Medicine | Admitting: Internal Medicine

## 2016-01-09 VITALS — Wt 174.8 lb

## 2016-01-09 DIAGNOSIS — J449 Chronic obstructive pulmonary disease, unspecified: Secondary | ICD-10-CM | POA: Diagnosis not present

## 2016-01-09 DIAGNOSIS — J438 Other emphysema: Secondary | ICD-10-CM

## 2016-01-09 NOTE — Progress Notes (Signed)
I have reviewed a Home Exercise Prescription with Jeremiah [name] . Jeremiah [name] is not currently exercising at home.  The patient was advised to walk 2-3 days a week for 30 minutes.  Jeremiah [name] and I discussed how to progress their exercise prescription.  The patient stated that their goals were to increase stamina, endurance, have a better diet, and be more aware.  The patient stated that they understand the exercise prescription.  We reviewed exercise guidelines, target heart rate during exercise, oxygen use, weather, home pulse oximeter, endpoints for exercise, and goals.  Patient is encouraged to come to me with any questions. I will continue to follow up with the patient to assist them with progression and safety.

## 2016-01-09 NOTE — Progress Notes (Signed)
Daily Session Note  Patient Details  Name: Jeremiah [name] MRN: [id number] Date of Birth: 1946-03-03 Referring Provider:   [name] Pulmonary Rehab Walk Test from 12/12/2015 in [place]  Referring Provider  Dr. Melvyn Novas      Encounter Date: 01/09/2016  Check In:     Session Check In - 01/09/16 1028      Check-In   Location [facility]   Staff Present Su Hilt, MS, ACSM RCEP, Exercise Physiologist;Joan Leonia Reeves, RN, BSN;[name] Hughes, RN;Portia Rollene Rotunda, RN, BSN   Supervising physician immediately available to respond to emergencies [facility] immediately available   Physician(s) Dr. Dyann Kief   Medication changes reported     No   Fall or balance concerns reported    No   Warm-up and Cool-down Performed as group-led instruction   Resistance Training Performed Yes   VAD Patient? No     Pain Assessment   Currently in Pain? No/denies   Multiple Pain Sites No      Capillary Blood Glucose: No results found for this or any previous visit (from the past 24 hour(s)).      Exercise Prescription Changes - 01/09/16 1200      Exercise Review   Progression Yes     Response to Exercise   Blood Pressure (Admit) 120/64   Blood Pressure (Exercise) 122/66   Blood Pressure (Exit) 100/60   Heart Rate (Admit) 65 bpm   Heart Rate (Exercise) 70 bpm   Heart Rate (Exit) 71 bpm   Oxygen Saturation (Admit) 96 %   Oxygen Saturation (Exercise) 76 %   Oxygen Saturation (Exit) 71 %   Rating of Perceived Exertion (Exercise) 17   Perceived Dyspnea (Exercise) 2   Duration Progress to 45 minutes of aerobic exercise without signs/symptoms of physical distress   Intensity THRR unchanged     Progression   Progression Continue to progress workloads to maintain intensity without signs/symptoms of physical distress.     Resistance Training   Training Prescription Yes   Weight bands   Reps 10-12  10 minutes of strength training     Interval Training   Interval Training No     Treadmill   MPH 2.2   Grade 2   Minutes 17     NuStep   Level 4   Minutes 17   METs 2.7     Rower   Level 3   Minutes 17     Goals Met:  Exercise tolerated well No report of cardiac concerns or symptoms Strength training completed today  Goals Unmet:  Not Applicable  Comments: Service time is from 10:30AM to 12:15PM    Dr. Rush Farmer is Medical Director for Pulmonary Rehab at [facility].

## 2016-01-11 ENCOUNTER — Encounter (HOSPITAL_COMMUNITY)
Admission: RE | Admit: 2016-01-11 | Discharge: 2016-01-11 | Disposition: A | Discharge status: Home / Self Care | Payer: Medicare Other | Source: Ambulatory Visit | Attending: Internal Medicine | Admitting: Internal Medicine

## 2016-01-11 VITALS — Wt 173.7 lb

## 2016-01-11 DIAGNOSIS — J438 Other emphysema: Secondary | ICD-10-CM

## 2016-01-11 DIAGNOSIS — J449 Chronic obstructive pulmonary disease, unspecified: Secondary | ICD-10-CM

## 2016-01-11 NOTE — Progress Notes (Signed)
Jeremiah [name] presents to pulmonary rehab for his bi-weekly exercise session. I have completed his thirty day face to face review and determined that Jeremiah [name] is on track for meeting their pulmonary rehab goals. There are not barriers identified that will prevent them from continuing their exercise in pulmonary rehab as prescribed.   Rush Farmer, M.D. [facility] Pulmonary/Critical Care Medicine. Pager: [phone]. After hours pager: [phone].

## 2016-01-11 NOTE — Progress Notes (Signed)
Daily Session Note  Patient Details  Name: Jeremiah [name] MRN: [id number] Date of Birth: 11/16/1945 Referring Provider:   Flowsheet Row Pulmonary Rehab Walk Test from 12/12/2015 in [facility]  Referring Provider  Dr. Wert      Encounter Date: 01/11/2016  Check In:     Session Check In - 01/11/16 1103      Check-In   Location [facility]   Staff Present  , RN, BSN;Molly diVincenzo, MS, ACSM RCEP, Exercise Physiologist;[name] Hughes, RN;Portia Payne, RN, BSN   Supervising physician immediately available to respond to emergencies [facility] immediately available   Physician(s) DR. ARRIEN   Medication changes reported     No   Fall or balance concerns reported    No   Warm-up and Cool-down Performed as group-led instruction   Resistance Training Performed Yes   VAD Patient? No     Pain Assessment   Currently in Pain? No/denies   Multiple Pain Sites No      Capillary Blood Glucose: No results found for this or any previous visit (from the past 24 hour(s)).      Exercise Prescription Changes - 01/11/16 1200      Response to Exercise   Blood Pressure (Admit) 100/60   Blood Pressure (Exercise) 118/64   Blood Pressure (Exit) 102/64   Heart Rate (Admit) 53 bpm   Heart Rate (Exercise) 66 bpm   Heart Rate (Exit) 61 bpm   Oxygen Saturation (Admit) 94 %   Oxygen Saturation (Exercise) 95 %   Oxygen Saturation (Exit) 95 %   Rating of Perceived Exertion (Exercise) 12   Perceived Dyspnea (Exercise) 2   Duration Progress to 45 minutes of aerobic exercise without signs/symptoms of physical distress   Intensity THRR unchanged     Progression   Progression Continue to progress workloads to maintain intensity without signs/symptoms of physical distress.     Resistance Training   Training Prescription Yes   Weight bands   Reps 10-12  10 minutes of strength training     Interval Training   Interval Training No     Treadmill   MPH 2.2   Grade 2   Minutes 17     Rower   Level 3   Minutes 17     Goals Met:  Independence with exercise equipment Improved SOB with ADL's Exercise tolerated well Strength training completed today  Goals Unmet:  Not Applicable  Comments: Service time is from 1030 to 1220    Dr. Wesam G. Yacoub is Medical Director for Pulmonary Rehab at [facility]. 

## 2016-01-15 DIAGNOSIS — Z23 Encounter for immunization: Secondary | ICD-10-CM | POA: Diagnosis not present

## 2016-01-16 ENCOUNTER — Encounter (HOSPITAL_COMMUNITY)
Admission: RE | Admit: 2016-01-16 | Discharge: 2016-01-16 | Disposition: A | Discharge status: Home / Self Care | Payer: Medicare Other | Source: Ambulatory Visit | Attending: Internal Medicine | Admitting: Internal Medicine

## 2016-01-16 VITALS — Wt 180.1 lb

## 2016-01-16 DIAGNOSIS — J438 Other emphysema: Secondary | ICD-10-CM

## 2016-01-16 DIAGNOSIS — J449 Chronic obstructive pulmonary disease, unspecified: Secondary | ICD-10-CM | POA: Insufficient documentation

## 2016-01-16 NOTE — Progress Notes (Signed)
Daily Session Note  Patient Details  Name: Jeremiah [name] MRN: [id number] Date of Birth: [birth year] Referring Provider:   [name] Pulmonary Rehab Walk Test from 12/12/2015 in [place]  Referring Provider  Dr. Melvyn Novas      Encounter Date: 01/16/2016  Check In:     Session Check In - 01/16/16 1034      Check-In   Location [facility]   Staff Present Su Hilt, MS, ACSM RCEP, Exercise Physiologist;Joan Leonia Reeves, RN, BSN;[name] Hughes, RN;Delvin Hedeen Rollene Rotunda, RN, BSN   Supervising physician immediately available to respond to emergencies [facility] immediately available   Physician(s) dr. Maylene Roes   Medication changes reported     No   Fall or balance concerns reported    No   Warm-up and Cool-down Performed as group-led instruction   Resistance Training Performed Yes   VAD Patient? No     Pain Assessment   Currently in Pain? No/denies   Multiple Pain Sites No      Capillary Blood Glucose: No results found for this or any previous visit (from the past 24 hour(s)).      Exercise Prescription Changes - 01/16/16 1604      Response to Exercise   Blood Pressure (Admit) 120/66   Blood Pressure (Exercise) 128/64   Blood Pressure (Exit) 118/68   Heart Rate (Admit) 73 bpm   Heart Rate (Exercise) 86 bpm   Heart Rate (Exit) 77 bpm   Oxygen Saturation (Admit) 97 %   Oxygen Saturation (Exercise) 93 %   Oxygen Saturation (Exit) 97 %   Rating of Perceived Exertion (Exercise) 13   Perceived Dyspnea (Exercise) 2   Duration Progress to 45 minutes of aerobic exercise without signs/symptoms of physical distress   Intensity THRR unchanged     Progression   Progression Continue to progress workloads to maintain intensity without signs/symptoms of physical distress.     Resistance Training   Training Prescription Yes   Weight bands   Reps 10-12  10 minutes of strength training     Interval Training   Interval Training No     Treadmill   MPH 2.2   Grade 2   Minutes 17     NuStep   Level 5   Minutes 17   METs 3.6     Rower   Level 3   Minutes 17     Goals Met:  Independence with exercise equipment Improved SOB with ADL's Using PLB without cueing & demonstrates good technique Exercise tolerated well No report of cardiac concerns or symptoms Strength training completed today  Goals Unmet:  Not Applicable  Comments: Service time is from 1030 to 1210   Dr. Rush Farmer is Medical Director for Pulmonary Rehab at [facility].

## 2016-01-17 ENCOUNTER — Ambulatory Visit (INDEPENDENT_AMBULATORY_CARE_PROVIDER_SITE_OTHER): Payer: Medicare Other | Admitting: Internal Medicine

## 2016-01-17 ENCOUNTER — Encounter: Payer: Self-pay | Admitting: Internal Medicine

## 2016-01-17 VITALS — BP 112/68 | HR 57 | Temp 98.1°F | Ht 73.5 in | Wt 177.0 lb

## 2016-01-17 DIAGNOSIS — J449 Chronic obstructive pulmonary disease, unspecified: Secondary | ICD-10-CM | POA: Diagnosis not present

## 2016-01-17 MED ORDER — BENZONATATE 200 MG PO CAPS: 200.0000 mg | ORAL_CAPSULE | Freq: Three times a day (TID) | ORAL | 11 refills | Status: DC | PRN

## 2016-01-17 NOTE — Progress Notes (Signed)
Subjective:     Patient ID: Jeremiah [name], male   DOB: 1945/05/07   MRN: [id number]    Brief patient profile:  37 yobm quit smoking 1989 with no problems with variable sob x 2004 and maintained on inhalers referred 05/03/2013 by Dr Titus Mould for evaluation of outpt management for copd after hospitalization and found to have GOLD III COPD 01/2014  with tendency to panic when sob.  Admit date: 04/24/2013  Discharge date: 04/27/2013  Discharge Diagnoses:  Active Problems:  COPD exacerbation  Detailed Hospital Course:  70 yo male with abrupt onset of sob (1/10) that progressively worsened until EMS called. Patient intubated in ED due to severe respiratory distress. In route, patient had increasing tachypnea, decline SpO2, and obtundation. Patient with GCS <8 at arrival, evidence of emesis, intubated for severe respiratory distress. Family reported no evidence of fevers, chills, malaise, worsening coughing, and sob. Initial episode passed, however sister received a call later with him reporting a return of symptoms, complaints of inability of breathing and EMS alerted. Distant history of tobacco abuse. Has never been hospitalized for COPD before this episode. He has had no recent hospitalizations, and has never been previously intubated.  He was admitted to the ICU on 1/11 and was treated with the typical modalities for acute respiratory failure (for presumed AECODP) which included mechanical ventilation with sedation, inhaled bronchodilators, antibiotics, and systemic steroid therapy. He improved later that day and was able to be extubated and sedation was stopped. His care since that point has primarily focused on the de-escalation of the therapies mentioned above. On 1/13 he has returned to a near-baseline state of health and has been deemed a candidate for discharge.  Discharge Plan by diagnoses  Acute exacerbation of COPD  Acute hypercarbic respiratory failure (resolved)  plan  -Follow up with Dr. Melvyn Novas  on 05/03/2013 at 2:30PM  -Change steroids to PO Prednisone, taper over 7-10d  -Resume home dose BDs    History of Present Illness  05/03/2013 1st [facility] office visit/ Raychel Dowler cc more sob since Oct 2014 on symbicort 160 2 bid and spiriva daily  But still  daily use of proventil and no need for neb then needed neb x sev months prior to his admit above but back to baseline since d/c on pred still tapering off but using saba qid, not prn "out of habit" >>return for PFT   01/03/2014 Follow up  [name] .  Complains of increased episodes of increased DOE, wheezing, prod cough with white/clear mucus worse for last week. Family member says he has been progressively get worse over last year. Was admitted 04/2013 w/ COPD flare /VDRF . [name] from last office visit however did not follow up .   Followed at [facility] , says he was told he has Moderate COPD 6 yrs ago. On Symbicort and Spiriva.  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  Says he gets winded easily esp on incline.  Says worse for last week with increased cough and wheezing.  No hemotpysis, wt loss, calf pain, n/v/d, chest pain or fever.  Last abx ~2 months ago.  rec Doxycycline 100mg  Twice daily  For 7 days  Prednisone taper over next week Mucinex DM Twice daily  As needed  Cough /congestion  Continue on Symbicort and Spiriva .    01/24/2014 f/u ov/Soraiya Ahner re:  GOLD III Chief  Complaint  Patient presents with  . Follow-up    PFT done today. Pt states that SOB and cough have improved some, but not back to normal baseline. Cough is prod with minimal clear sputum.   can do HT but no mall due to sob Prednisone really helped a lot but did not maintain improvement once finished >>pred taper add ppi bid and diet     12/12/2014 acute  ov/Giuliano Preece re: ? aecopd /new gi/gu complaints ? From spiriva?   Chief Complaint  Patient presents  with  . Acute Visit    Pt c/o increased SOB and cough x 4 days. Cough is not very prod- clear sputum. He also c/o increased gas and bloating.   downhill since prev ov, changing his own maint rx (dc'd pepcid) >  severe cough at hs onset 12/09/14  On spiriva lots of gi/gu issues  Started pred one day prior to OV  As per calendar action plan  Has only used saba so far in hfa form, has neb but doesn't feel he needs it yet.  >changed spiriva to [place]     04/04/2015  f/u ov/Cormac Wint re: copd  Chief Complaint  Patient presents with  . Follow-up    pt following for COPD: pt states hes been its been on and off. pt states 3 weekws ago he was diagnosed with bronchitits but he feels much better. pt c/o a dry cough thats been bothering him mostly in the night time. pt states at times he has to take the CPAP off because he has these couhing fits. no c/o SOB , wheezing, or chest tightness.   has med calendar but not following the action plans at the bottom for cough  > For cough/ congestion > mucinex dm and flutter valve       07/20/2015  f/u ov/Marti Acebo re: aecopd/ using med calendar though not flutter or approp dose of mucinex dm/ confused with when to use pred  Chief Complaint  Patient presents with  . Follow-up    pt c/o nonprod cough, chest congestion, sob X4-5 days.  S/s worse qhs.    last prednisone finished on 07/16/15 and getting worse again in terms of breathing and dry coughing rec In the event that you need your nebulizer more than rarely for your breathing: prednisone 20 mg daily until better  Then 10 mg daily x 5 days and stop For cough > mucinex dm 1200 mg every 12 hours and use the flutter valve Work on inhaler technique    08/29/2015  f/u ov/Dahmir Epperly re:  GOLD III  symbicort 160 2bid/ tudorza  Chief Complaint  Patient presents with  . Follow-up    Cough has not improved since the last visit. He uses ventolin at least once per day and neb with albuterol 2 x daily on average.   Still using  oil based vit D, following med calendar better / rarely taking prednisone but helps when does  Lots of hoarseness and dry cough  Day >   >stop [place] and begin Spiriva     11/01/2015 NP  Acute OV  Presents for an acute office visit Patient complains of 2 weeks of increased cough, shortness of breath and wheezing. Feels that the high temperatures and humidity or making his breathing worse. Wears out with minimum activity   Denies any chest congestion/tightness, sinus pressure/drainage, fever, nausea or vomiting.  Has any fever or discolored mucus. Remains on Symbicort and Spiriva. Did start on prednisone over the last 1-2  days. Still has persistent cough and wheezing. Cough is keeping him up at night.. Patient and wife have multiple questions about his severity of his underlying COPD. He went over purse lip breathing. He would like to try pulmonary rehabilitation again. rec Zpack to have on hold if symtpoms worsen with discolored mucus.  Prednisone taper over next week.  Hydromet 1 tsp every 6hr, As needed  Cough , may make sleepy.  Refer to pulmonary rehab    12/25/2015  f/u ov/Rosalba Totty re: GOLD III copd/ symb/spiriva - using med calendar well  Chief Complaint  Patient presents with  . Follow-up    Doing well and denies any co's today.   doe [facility] = can't walk a nl pace on a flat grade s sob but does fine slow and flat eg  WM shopping ok p using [facility] parking - rare saba need -has not needed pred recently main complaint is abd distention/ constipation  rec Try spiriva just one puff each am to see if bloating/constipation improve  and if so leave the dose just at one pff - if not ok try off completely after a few weeks of one pff but if off it makes no difference at all then just restart it at 2 each am as per med calendar  See calendar for specific medication instructions    01/17/2016 acute extended ov/Adrea Sherpa re: GOLD III/ aecopd  Chief Complaint  Patient presents with  . Acute Visit     Pt c/o cough x 5 days- prod with min clear sputum.  He had nosebleed last night.    severe cough onset 10/1 > min white mucus, worse at hs  Confused with names of meds and correlating them with med calendar though did bring it with him  Did not previously disclose use of tessalon but says now he was using it prn and worked fine until it ran out  Note note now on tenoretic, also not on med calendar    No obvious day to day or daytime variability or assoc excess/ purulent sputum or mucus plugs or hemoptysis or cp or chest tightness, subjective wheeze or overt sinus or hb symptoms. No unusual exp hx or h/o childhood pna/ asthma or knowledge of premature birth.  Sleeping ok without nocturnal  or early am exacerbation  of respiratory  c/o's or need for noct saba. Also denies any obvious fluctuation of symptoms with weather or environmental changes or other aggravating or alleviating factors except as outlined above   Current Medications, Allergies, Complete Past Medical History, Past Surgical History, Family History, and Social History were reviewed in [employer] record.  ROS  The following are not active complaints unless bolded sore throat, dysphagia, dental problems, itching, sneezing,  nasal congestion or excess/ purulent secretions, ear ache,   fever, chills, sweats, unintended wt loss, classically pleuritic or exertional cp,  orthopnea pnd or leg swelling, presyncope, palpitations, abdominal pain, anorexia, nausea, vomiting, diarrhea  or change in bowel or bladder habits, change in stools or urine, dysuria,hematuria,  rash, arthralgias, visual complaints, headache, numbness, weakness or ataxia or problems with walking or coordination,  change in mood/affect or memory.            Objective:       Physical Exam  GEN: A/Ox3; pleasant , NAD, elderly , thin very anxious bm nad      07/07/2014        188  > 07/20/2014     182 08/03/2014 >>  11/01/2014  181 > 12/12/2014   177  >182 01/03/2015 >  04/04/2015  183 >04/28/2015 184 > 08/29/2015  181 > 12/25/2015  177> 01/17/2016     Vital signs reviewed - sats 96% on RA on arrival      HEENT:  [place]/AT,  EACs-clear, TMs-wnl, NOSE-clear, THROAT-clear, no lesions, no postnasal drip or exudate noted.  Top dentures -   NECK:  Supple w/ fair ROM; no JVD; normal carotid impulses w/o bruits; no thyromegaly or nodules palpated; no lymphadenopathy.    RESP     .  no accessory muscle use, no dullness to percussion- distant bs bilaterally with increased Texp  CARD:  RRR, no m/r/g  , no peripheral edema, pulses intact, no cyanosis or clubbing.  GI:   Min distended but Soft & nt; nml bowel sounds; no organomegaly or masses detected.   Musco: Warm bil, no deformities or joint swelling noted.   Neuro: alert, no focal deficits noted.    Skin: Warm, no lesions or rashes

## 2016-01-17 NOTE — Patient Instructions (Addendum)
For dry cough as per med calendar > tessalon pearls 200 mg every 4-6 hours as needed and use the flutter valve  For congested cough > mucinex dm up to 1200  Every 12 hours and use the flutter valve also   increase the spiriva back to 2 puffs each am   See calendar for specific medication instructions and bring it back for each and every office visit for every healthcare provider you see.  Without it,  you may not receive the best quality medical care that we feel you deserve.  You will note that the calendar groups together  your maintenance  medications that are timed at particular times of the day.  Think of this as your checklist for what your doctor has instructed you to do until your next evaluation to see what benefit  there is  to staying on a consistent group of medications intended to keep you well.  The other group at the bottom is entirely up to you to use as you see fit  for specific symptoms that may arise between visits that require you to treat them on an as needed basis.  Think of this as your action plan or "what if" list.   Separating the top medications from the bottom group is fundamental to providing you adequate care going forward.    Change next appt to see Tammy in 4 weeks for new med calendar  - ? Change tenoretic to ziac?

## 2016-01-18 ENCOUNTER — Encounter (HOSPITAL_COMMUNITY)
Admission: RE | Admit: 2016-01-18 | Discharge: 2016-01-18 | Disposition: A | Discharge status: Home / Self Care | Payer: Medicare Other | Source: Ambulatory Visit | Attending: Internal Medicine | Admitting: Internal Medicine

## 2016-01-18 ENCOUNTER — Encounter: Payer: Self-pay | Admitting: Internal Medicine

## 2016-01-18 VITALS — Wt 177.7 lb

## 2016-01-18 DIAGNOSIS — J449 Chronic obstructive pulmonary disease, unspecified: Secondary | ICD-10-CM | POA: Diagnosis not present

## 2016-01-18 DIAGNOSIS — J438 Other emphysema: Secondary | ICD-10-CM

## 2016-01-18 NOTE — Progress Notes (Signed)
Jeremiah [name] 70 y.o. male  30 day Psychosocial Note  Patient psychosocial assessment reveals no barriers to participation in Pulmonary Rehab.He has attended all pulmonary rehab exercise sessions since his admission. Patient does continue to exhibit positive coping skills to deal with any psychosocial concerns that may arrise. He is offered emotional support and reassurance. Patient does feel he is making progress toward Pulmonary Rehab goals. Patient reports his health and activity level has improved in the past 30 days as evidenced by patient's report of increased ability to tolerate exercise. He also is beginning to "feel better". Patient states his wife has noticed changes in his mood. Patient reports feeling positive about current and projected progression in Pulmonary Rehab. After reviewing the patient's treatment plan, the patient is making progress toward Pulmonary Rehab goals. Patient's rate of progress toward rehab goals is excellent. Plan of action to help patient continue to work towards rehab goals include increasing workloads on the equipment in order to increase his stamina and strength. Will continue to monitor and evaluate progress toward psychosocial goal(s).  Goal(s) in progress: Improved management of stress related to the settling of his father's estate  Help patient work toward returning to meaningful activities that improve patient's QOL and are attainable with patient's lung disease

## 2016-01-18 NOTE — Progress Notes (Signed)
Daily Session Note  Patient Details  Name: Jeremiah [name] MRN: [id number] Date of Birth: March 19, 1946 Referring Provider:   [name] Pulmonary Rehab Walk Test from 12/12/2015 in [place]  Referring Provider  Dr. Melvyn Novas      Encounter Date: 01/18/2016  Check In:     Session Check In - 01/18/16 1030      Check-In   Location [facility]   Staff Present Su Hilt, MS, ACSM RCEP, Exercise Physiologist;Joan Leonia Reeves, RN, BSN;Neela Zecca, RN;Portia Rollene Rotunda, RN, BSN   Supervising physician immediately available to respond to emergencies [facility] immediately available   Physician(s) Dr. Maryland Pink   Medication changes reported     No   Fall or balance concerns reported    No   Warm-up and Cool-down Performed as group-led instruction   Resistance Training Performed Yes   VAD Patient? No     Pain Assessment   Currently in Pain? No/denies   Multiple Pain Sites No      Capillary Blood Glucose: No results found for this or any previous visit (from the past 24 hour(s)).      Exercise Prescription Changes - 01/18/16 1200      Response to Exercise   Blood Pressure (Admit) 124/70   Blood Pressure (Exercise) 110/74   Blood Pressure (Exit) 98/60   Heart Rate (Admit) 58 bpm   Heart Rate (Exercise) 67 bpm   Heart Rate (Exit) 56 bpm   Oxygen Saturation (Admit) 97 %   Oxygen Saturation (Exercise) 98 %   Oxygen Saturation (Exit) 98 %   Rating of Perceived Exertion (Exercise) 12   Perceived Dyspnea (Exercise) 1   Duration Progress to 45 minutes of aerobic exercise without signs/symptoms of physical distress   Intensity THRR unchanged     Progression   Progression Continue to progress workloads to maintain intensity without signs/symptoms of physical distress.     Resistance Training   Training Prescription Yes   Weight green bands   Reps 10-12  10 minutes of strength training     Interval Training   Interval Training  No     NuStep   Level 5   Minutes 17   METs 4.1     Rower   Level 3   Minutes 17     Goals Met:  Exercise tolerated well No report of cardiac concerns or symptoms Strength training completed today  Goals Unmet:  Not Applicable  Comments: Service time is from 1030 to 1220    Dr. Rush Farmer is Medical Director for Pulmonary Rehab at [facility].

## 2016-01-18 NOTE — Assessment & Plan Note (Addendum)
- PFTs 01/24/2014  FEV1  1.30 (35%) ratio 49 with severe air trapping and no better p saba and dlco 48% -01/24/2014 p extensive coaching HFA effectiveness =    90%  - referred to rehab 01/24/14  -Med calendar 02/21/2014 > did not recognize it when reprinted 03/29/14 > reviewed , 08/03/2014 , 04/28/2015   - 07/07/14 rec short course prednisone for flares built into action plan  - d/c spiriva 12/12/2014 > trial of tudorza due to gi/gu side effects - 07/20/2015  p extensive coaching HFA effectiveness =    90% from a baseline 75%  - 07/20/2015 changed prednisone to 20 mg per day until better, then taper off with trigger by need for neb - 08/29/2015 changed tudorza to spiriva respimat  09/12/2015 med calendar - 12/25/2015 rec wean spiriva to see what effect if any has on abd distention> not change 01/17/2016 so increased back to 2 pffs qam    Symptoms remain difficult to control . DDX of  difficult airways management almost all start with A and  include Adherence, Ace Inhibitors, Acid Reflux, Active Sinus Disease, Alpha 1 Antitripsin deficiency, Anxiety masquerading as Airways dz,  ABPA,  Allergy(esp in young), Aspiration (esp in elderly), Adverse effects of meds,  Active smokers, A bunch of PE's (a small clot burden can't cause this syndrome unless there is already severe underlying pulm or vascular dz with poor reserve) plus two Bs  = Bronchiectasis and Beta blocker use..and one C= CHF  Adherence is always the initial "prime suspect" and is a multilayered concern that requires a "trust but verify" approach in every patient - starting with knowing how to use medications, especially inhalers, correctly, keeping up with refills and understanding the fundamental difference between maintenance and prns vs those medications only taken for a very short course and then stopped and not refilled.  - not a good correlation between med calendar and meds - see below - - The proper method of use, as well as anticipated side  effects, of a metered-dose inhaler are discussed and demonstrated to the patient. Improved effectiveness after extensive coaching during this visit to a level of approximately 90 % from a baseline of 75 %  > continue with hfa/ respimat delivery systems    ? Anxiety playing a bigger role in symptoms than prev appreciated as his exam is clear today  ? BB effects > Strongly prefer in this setting: Bystolic, the most beta -1  selective Beta blocker available in sample form, with bisoprolol the most selective generic choice  on the market.  Consider change to ziac next ov    I had an extended discussion with the patient reviewing all relevant studies completed to date and  lasting 15 to 20 minutes of a 25 minute visit    Ok to resume tessalon but needs to be on action plan of med calendar  [place] to continue tenoretic and resume full dose spiriva but observe for side effects and low threshhold to change to ziac    Each maintenance medication was reviewed in detail including most importantly the difference between maintenance and prns and under what circumstances the prns are to be triggered using an action plan format that is not reflected in the computer generated alphabetically organized AVS but trather by a customized med calendar that reflects the AVS meds with confirmed 100% correlation.   Please see instructions for details which were reviewed in writing and the patient given a copy highlighting the part that I personally  wrote and discussed at today's ov.

## 2016-01-18 NOTE — Progress Notes (Signed)
Jeremiah [name] 70 y.o. male Nutrition Note Spoke with pt. There are some ways the pt can make his eating habits healthier. Pt's Rate Your Plate results reviewed with pt. Pt tries to avoid most salty food; uses canned tomato soup occasionally. The role of sodium in lung disease reviewed with pt. Pt is pre-diabetic according to the most recent A1c. Pre-diabetes discussed. Pt expressed understanding of the information reviewed via feedback method.    No results found for: HGBA1C  Nutrition Diagnosis ? Food-and nutrition-related knowledge deficit related to lack of exposure to information as related to diagnosis of pulmonary disease  Nutrition Intervention ? Pt's individual nutrition plan and goals reviewed with pt. ? Benefits of adopting healthy eating habits discussed when pt's Rate Your Plate reviewed. ? Pt to attend the Nutrition and Lung Disease class ? Continual client-centered nutrition education by RD, as part of interdisciplinary care. Goal(s) 1. Pt to increase consumption of fish to at least once weekly 2. Pt to eat meatless meals at least once weekly  Monitor and Evaluate progress toward nutrition goal with team.   Derek Mound, M.Ed, RD, LDN, CDE 01/18/2016 11:50 AM

## 2016-01-23 ENCOUNTER — Encounter (HOSPITAL_COMMUNITY)
Admission: RE | Admit: 2016-01-23 | Discharge: 2016-01-23 | Disposition: A | Discharge status: Home / Self Care | Payer: Medicare Other | Source: Ambulatory Visit | Attending: Internal Medicine | Admitting: Internal Medicine

## 2016-01-23 VITALS — Wt 176.6 lb

## 2016-01-23 DIAGNOSIS — J449 Chronic obstructive pulmonary disease, unspecified: Secondary | ICD-10-CM | POA: Diagnosis not present

## 2016-01-23 NOTE — Progress Notes (Signed)
Daily Session Note  Patient Details  Name: Jeremiah [name] MRN: [id number] Date of Birth: 1945-07-08 Referring Provider:   [name] Pulmonary Rehab Walk Test from 12/12/2015 in [place]  Referring Provider  Dr. Melvyn Novas      Encounter Date: 01/23/2016  Check In:   Capillary Blood Glucose: No results found for this or any previous visit (from the past 24 hour(s)).      Exercise Prescription Changes - 01/23/16 1200      Response to Exercise   Blood Pressure (Admit) 102/60   Blood Pressure (Exercise) 116/80   Blood Pressure (Exit) 106/62   Heart Rate (Admit) 50 bpm   Heart Rate (Exercise) 74 bpm   Heart Rate (Exit) 63 bpm   Oxygen Saturation (Admit) 95 %   Oxygen Saturation (Exercise) 94 %   Oxygen Saturation (Exit) 95 %   Rating of Perceived Exertion (Exercise) 13   Perceived Dyspnea (Exercise) 2   Duration Progress to 45 minutes of aerobic exercise without signs/symptoms of physical distress   Intensity THRR unchanged     Progression   Progression Continue to progress workloads to maintain intensity without signs/symptoms of physical distress.     Resistance Training   Training Prescription Yes   Weight green bands   Reps 10-12  10 minutes of strength training     Interval Training   Interval Training No     Treadmill   MPH 2.2   Grade 2   Minutes 17     NuStep   Level 5   Minutes 17   METs 3.4     Rower   Level 3   Minutes 17     Goals Met:  Exercise tolerated well No report of cardiac concerns or symptoms Strength training completed today  Goals Unmet:  Not Applicable  Comments: Service time is from 10:30am to 12:00pm    Dr. Rush Farmer is Medical Director for Pulmonary Rehab at [facility].

## 2016-01-23 NOTE — Progress Notes (Signed)
Pulmonary Individual Treatment Plan  Patient Details  Name: Jeremiah [name] MRN: [id number] Date of Birth: 07-07-1945 Referring Provider:   [name] Pulmonary Rehab Walk Test from 12/12/2015 in [place]  Referring Provider  Dr. Melvyn Novas      Initial Encounter Date:  Flowsheet Row Pulmonary Rehab Walk Test from 12/12/2015 in [place]  Date  12/12/15  Referring Provider  Dr. Melvyn Novas      Visit Diagnosis: Chronic obstructive pulmonary disease, unspecified COPD type ([place])  Patient's Home Medications on Admission:   Current Outpatient Prescriptions:  .  albuterol (PROVENTIL HFA;VENTOLIN HFA) 108 (90 BASE) MCG/ACT inhaler, Inhale 2 puffs into the lungs every 4 (four) hours as needed for wheezing or shortness of breath (((PLAN B)))., Disp: 1 Inhaler, Rfl: 5 .  albuterol (PROVENTIL) (2.5 MG/3ML) 0.083% nebulizer solution, Take 3 mLs by nebulization every 4 (four) hours as needed for wheezing or shortness of breath (((PLAN C))). , Disp: , Rfl:  .  ALPRAZolam (XANAX) 0.5 MG tablet, TAKE 1 TABLET BY MOUTH TWICE A DAY AS NEEDED FOR ANXIETY, Disp: 60 tablet, Rfl: 1 .  atenolol-chlorthalidone (TENORETIC) 50-25 MG tablet, Take 1 tablet by mouth at bedtime., Disp: , Rfl: 11 .  B-D 3CC LUER-LOK SYR 21GX1-1/2 21G X 1-1/2" 3 ML MISC, USE NEEDLE/SYRINGE TO INJECT TESTOSTERONE INTO SKIN ONCE EVERY 2 WEEKS., Disp: , Rfl: 0 .  benzonatate (TESSALON) 200 MG capsule, Take 1 capsule (200 mg total) by mouth 3 (three) times daily as needed for cough., Disp: 60 capsule, Rfl: 11 .  budesonide-formoterol (SYMBICORT) 160-4.5 MCG/ACT inhaler, Inhale 2 puffs into the lungs 2 (two) times daily., Disp: , Rfl:  .  buPROPion (WELLBUTRIN XL) 300 MG 24 hr tablet, Take 1 tablet by mouth daily., Disp: , Rfl:  .  CARAFATE 1 GM/10ML suspension, TAKE 10 MLS (1 GRAM TOTAL) BY MOUTH 4 TIMES A DAY, Disp: , Rfl: 1 .  cetirizine (ZYRTEC) 10 MG tablet, Take 10 mg by mouth at  bedtime as needed (drainage). , Disp: , Rfl:  .  Cholecalciferol (VITAMIN D3) 2000 units TABS, Take 1 tablet by mouth every morning., Disp: , Rfl:  .  dextromethorphan (DELSYM) 30 MG/5ML liquid, Take 2 tsp twice daily as needed with flutter valve still coughing, Disp: , Rfl:  .  dextromethorphan-guaiFENesin (MUCINEX DM) 30-600 MG per 12 hr tablet, Take 54ml every 4 hours as needed with flutter valve for cough and congestion, Disp: , Rfl:  .  esomeprazole (NEXIUM) 40 MG capsule, Take 40 mg by mouth daily before breakfast. , Disp: , Rfl: 2 .  famotidine (PEPCID) 20 MG tablet, Take 20 mg by mouth at bedtime., Disp: , Rfl:  .  fluticasone (FLONASE) 50 MCG/ACT nasal spray, Place 2 sprays into both nostrils 2 (two) times daily., Disp: , Rfl:  .  hydrochlorothiazide (HYDRODIURIL) 25 MG tablet, Take 25 mg by mouth every morning. , Disp: , Rfl:  .  magnesium oxide (MAG-OX) 400 MG tablet, Take 400 mg by mouth 2 (two) times daily. For leg cramps, Disp: , Rfl:  .  naproxen sodium (ANAPROX) 220 MG tablet, Per bottle as needed for joint pain, Disp: , Rfl:  .  Polyvinyl Alcohol (LIQUIFILM TEARS OP), Reported on 09/13/2015, Disp: , Rfl:  .  Probiotic Product (PHILLIPS COLON HEALTH) CAPS, Take 1 tablet by mouth daily., Disp: , Rfl:  .  Respiratory Therapy Supplies (FLUTTER) DEVI, Use as directed, Disp: 1 each, Rfl: 0 .  Simethicone (GAS-X  PO), Take as directed for gas, Disp: , Rfl:  .  testosterone cypionate (DEPOTESTOSTERONE CYPIONATE) 200 MG/ML injection, INJECT 1 ML (200 MG TOTAL) INTO THE MUSCLE EVERY 14 (FOURTEEN) DAYS., Disp: , Rfl: 0 .  Tiotropium Bromide Monohydrate (SPIRIVA RESPIMAT) 2.5 MCG/ACT AERS, 2 each am, Disp: 1 Inhaler, Rfl: 11 .  traZODone (DESYREL) 50 MG tablet, Take 1 tablet by mouth at bedtime as needed., Disp: , Rfl:   Past Medical History: Past Medical History:  Diagnosis Date  . COPD (chronic obstructive pulmonary disease) ([place])   . Depression   . Hypertension   . PTSD (post-traumatic  stress disorder)   . Sleep apnea     Tobacco Use: History  Smoking Status  . Former Smoker  . Packs/day: 1.00  . Years: 30.00  . Types: Cigarettes  . Quit date: 04/16/1987  Smokeless Tobacco  . Never Used    Labs: Recent Review Flowsheet Data    Labs for ITP Cardiac and Pulmonary Rehab Latest Ref Rng & Units 04/24/2013 04/24/2013 04/25/2013   Trlycerides <150 mg/dL - - 29   PHART 7.350 - 7.450 7.101(LL) 7.175(LL) 7.440   PCO2ART 35.0 - 45.0 mmHg 85.1(HH) 61.4(HH) 30.2(L)   HCO3 20.0 - 24.0 mEq/L 27.4(H) 23.2 20.2   TCO2 0 - 100 mmol/L 30 25 21.1   ACIDBASEDEF 0.0 - 2.0 mmol/L 6.0(H) 7.0(H) 3.3(H)   O2SAT % 100.0 100.0 98.9      Capillary Blood Glucose: Lab Results  Component Value Date   GLUCAP 144 (H) 04/27/2013   GLUCAP 97 04/27/2013   GLUCAP 101 (H) 04/27/2013   GLUCAP 119 (H) 04/27/2013   GLUCAP 130 (H) 04/27/2013     ADL UCSD:   Pulmonary Function Assessment:     Pulmonary Function Assessment - 12/08/15 1035      Breath   Bilateral Breath Sounds Wheezes  mild wheezing heard in upper lobes bilat   Shortness of Breath Yes;Fear of Shortness of Breath;Limiting activity      Exercise Target Goals:    Exercise Program Goal: Individual exercise prescription set with THRR, safety & activity barriers. Participant demonstrates ability to understand and report RPE using BORG scale, to self-measure pulse accurately, and to acknowledge the importance of the exercise prescription.  Exercise Prescription Goal: Starting with aerobic activity 30 plus minutes a day, 3 days per week for initial exercise prescription. Provide home exercise prescription and guidelines that participant acknowledges understanding prior to discharge.  Activity Barriers & Risk Stratification:     Activity Barriers & Cardiac Risk Stratification - 12/08/15 1034      Activity Barriers & Cardiac Risk Stratification   Activity Barriers Shortness of Breath;Deconditioning      6 Minute  Walk:     6 Minute Walk    Row Name 12/14/15 0714         6 Minute Walk   Phase Initial     Distance 1275 feet     Walk Time 6 minutes     # of Rest Breaks 0     MPH 2.41     METS 2.84     RPE 11     Perceived Dyspnea  1     Symptoms No     Resting HR 59 bpm     Resting BP 124/80     Max Ex. HR 74 bpm     Max Ex. BP 134/80       Interval HR   Baseline HR 59     1 Minute  HR 69     2 Minute HR 73     3 Minute HR 73     4 Minute HR 74     5 Minute HR 74     6 Minute HR 74     2 Minute Post HR 67     Interval Heart Rate? Yes       Interval Oxygen   Interval Oxygen? Yes     Baseline Oxygen Saturation % 95 %     Baseline Liters of Oxygen 0 L     1 Minute Oxygen Saturation % 94 %     1 Minute Liters of Oxygen 0 L     2 Minute Oxygen Saturation % 94 %     2 Minute Liters of Oxygen 0 L     3 Minute Oxygen Saturation % 94 %     3 Minute Liters of Oxygen 0 L     4 Minute Oxygen Saturation % 94 %     4 Minute Liters of Oxygen 0 L     5 Minute Oxygen Saturation % 94 %     5 Minute Liters of Oxygen 0 L     6 Minute Oxygen Saturation % 94 %     6 Minute Liters of Oxygen 0 L     2 Minute Post Oxygen Saturation % 95 %     2 Minute Post Liters of Oxygen 0 L        Initial Exercise Prescription:     Initial Exercise Prescription - 12/14/15 0700      Date of Initial Exercise RX and Referring Provider   Date 12/12/15   Referring Provider Dr. Melvyn Novas     Treadmill   MPH 2   Grade 0   Minutes 17     NuStep   Level 3   Minutes 17   METs 1.5     Rower   Level 2   Minutes 17     Prescription Details   Frequency (times per week) 2   Duration Progress to 45 minutes of aerobic exercise without signs/symptoms of physical distress     Intensity   THRR 40-80% of Max Heartrate 68-122   Ratings of Perceived Exertion 11-13   Perceived Dyspnea 0-4     Progression   Progression Continue progressive overload as per policy without signs/symptoms or physical distress.      Resistance Training   Training Prescription Yes   Weight blue bands   Reps 10-12      Perform Capillary Blood Glucose checks as needed.  Exercise Prescription Changes:     Exercise Prescription Changes    Row Name 12/26/15 1200 12/28/15 1200 01/02/16 1200 01/04/16 1300 01/09/16 1200     Exercise Review   Progression  - Yes  -  - Yes     Response to Exercise   Blood Pressure (Admit) 100/68 100/64 136/74 140/72 120/64   Blood Pressure (Exercise) 122/74 132/88 132/70 124/72 122/66   Blood Pressure (Exit) 100/68 118/60 114/60 110/74 100/60   Heart Rate (Admit) 54 bpm 49 bpm 70 bpm 62 bpm 65 bpm   Heart Rate (Exercise) 67 bpm 59 bpm 80 bpm 71 bpm 70 bpm   Heart Rate (Exit) 59 bpm 55 bpm 68 bpm 63 bpm 71 bpm   Oxygen Saturation (Admit) 95 % 97 % 98 % 95 % 96 %   Oxygen Saturation (Exercise) 92 % 97 % 91 % 95 % 76 %  Oxygen Saturation (Exit) 94 % 94 % 96 % 96 % 71 %   Rating of Perceived Exertion (Exercise) 13 13 15 15 17    Perceived Dyspnea (Exercise) 1 2 3 2 2    Duration Progress to 45 minutes of aerobic exercise without signs/symptoms of physical distress Progress to 45 minutes of aerobic exercise without signs/symptoms of physical distress Progress to 45 minutes of aerobic exercise without signs/symptoms of physical distress Progress to 45 minutes of aerobic exercise without signs/symptoms of physical distress Progress to 45 minutes of aerobic exercise without signs/symptoms of physical distress   Intensity THRR unchanged THRR unchanged THRR unchanged THRR unchanged THRR unchanged     Progression   Progression Continue to progress workloads to maintain intensity without signs/symptoms of physical distress. Continue to progress workloads to maintain intensity without signs/symptoms of physical distress. Continue to progress workloads to maintain intensity without signs/symptoms of physical distress. Continue to progress workloads to maintain intensity without signs/symptoms of  physical distress. Continue to progress workloads to maintain intensity without signs/symptoms of physical distress.     Resistance Training   Training Prescription Yes Yes Yes Yes Yes   Weight blue bandas bands bands bands bands   Reps 10-12  10 minutes of strenght training 10-12  10 minutes of strenght training 10-12  10 minutes of strenght training 10-12  10 minutes of strength training 10-12  10 minutes of strength training     Interval Training   Interval Training No No No No No     Treadmill   MPH 2  - 2  - 2.2   Grade 0  - 0  - 2   Minutes 17  - 17  - 17     NuStep   Level 3 4 4 4 4    Minutes 17 17 17 17 17    METs  - 2.6 2.4 3.4 2.7     Rower   Level 2 2 2 2 3    Minutes 17 17 17 17 17      Home Exercise Plan   Plans to continue exercise at  -  -  -  - Home   Frequency  -  -  -  - Add 3 additional days to program exercise sessions.   [place] Name 01/11/16 1200 01/16/16 1604 01/18/16 1200         Response to Exercise   Blood Pressure (Admit) 100/60 120/66 124/70     Blood Pressure (Exercise) 118/64 128/64 110/74     Blood Pressure (Exit) 102/64 118/68 98/60     Heart Rate (Admit) 53 bpm 73 bpm 58 bpm     Heart Rate (Exercise) 66 bpm 86 bpm 67 bpm     Heart Rate (Exit) 61 bpm 77 bpm 56 bpm     Oxygen Saturation (Admit) 94 % 97 % 97 %     Oxygen Saturation (Exercise) 95 % 93 % 98 %     Oxygen Saturation (Exit) 95 % 97 % 98 %     Rating of Perceived Exertion (Exercise) 12 13 12      Perceived Dyspnea (Exercise) 2 2 1      Duration Progress to 45 minutes of aerobic exercise without signs/symptoms of physical distress Progress to 45 minutes of aerobic exercise without signs/symptoms of physical distress Progress to 45 minutes of aerobic exercise without signs/symptoms of physical distress     Intensity THRR unchanged THRR unchanged THRR unchanged       Progression   Progression Continue to progress workloads  to maintain intensity without signs/symptoms of physical  distress. Continue to progress workloads to maintain intensity without signs/symptoms of physical distress. Continue to progress workloads to maintain intensity without signs/symptoms of physical distress.       Resistance Training   Training Prescription Yes Yes Yes     Weight bands bands green bands     Reps 10-12  10 minutes of strength training 10-12  10 minutes of strength training 10-12  10 minutes of strength training       Interval Training   Interval Training No No No       Treadmill   MPH 2.2 2.2  -     Grade 2 2  -     Minutes 17 17  -       NuStep   Level  - 5 5     Minutes  - 17 17     METs  - 3.6 4.1       Rower   Level 3 3 3      Minutes 17 17 17         Exercise Comments:     Exercise Comments    Row Name 01/09/16 1702 01/22/16 0934         Exercise Comments Home exercise completed Patient is progressing well. Only on 8th session-- will cont. to monitor.          Discharge Exercise Prescription (Final Exercise Prescription Changes):     Exercise Prescription Changes - 01/18/16 1200      Response to Exercise   Blood Pressure (Admit) 124/70   Blood Pressure (Exercise) 110/74   Blood Pressure (Exit) 98/60   Heart Rate (Admit) 58 bpm   Heart Rate (Exercise) 67 bpm   Heart Rate (Exit) 56 bpm   Oxygen Saturation (Admit) 97 %   Oxygen Saturation (Exercise) 98 %   Oxygen Saturation (Exit) 98 %   Rating of Perceived Exertion (Exercise) 12   Perceived Dyspnea (Exercise) 1   Duration Progress to 45 minutes of aerobic exercise without signs/symptoms of physical distress   Intensity THRR unchanged     Progression   Progression Continue to progress workloads to maintain intensity without signs/symptoms of physical distress.     Resistance Training   Training Prescription Yes   Weight green bands   Reps 10-12  10 minutes of strength training     Interval Training   Interval Training No     NuStep   Level 5   Minutes 17   METs 4.1     Rower    Level 3   Minutes 17       Nutrition:  Target Goals: Understanding of nutrition guidelines, daily intake of sodium 1500mg , cholesterol 200mg , calories 30% from fat and 7% or less from saturated fats, daily to have 5 or more servings of fruits and vegetables.  Biometrics:     Pre Biometrics - 12/08/15 1036      Pre Biometrics   Grip Strength 25 kg       Nutrition Therapy Plan and Nutrition Goals:     Nutrition Therapy & Goals - 12/28/15 1513      Nutrition Therapy   Diet General, Healthful      Personal Nutrition Goals   Personal Goal #1 Maintain wt around 178 lb while in Pulmonary Rehab     Intervention Plan   Intervention Prescribe, educate and counsel regarding individualized specific dietary modifications aiming towards targeted core components such as weight, hypertension, lipid  management, diabetes, heart failure and other comorbidities.   Expected Outcomes Short Term Goal: Understand basic principles of dietary content, such as calories, fat, sodium, cholesterol and nutrients.;Long Term Goal: Adherence to prescribed nutrition plan.      Nutrition Discharge: Rate Your Plate Scores:     Nutrition Assessments - 01/18/16 1153      Rate Your Plate Scores   Pre Score 57      Psychosocial: Target Goals: Acknowledge presence or absence of depression, maximize coping skills, provide positive support system. Participant is able to verbalize types and ability to use techniques and skills needed for reducing stress and depression.  Initial Review & Psychosocial Screening:     Initial Psych Review & Screening - 12/08/15 1045      Initial Review   Current issues with Current Stress Concerns   Comments family relationships     Family Dynamics   Good Support System? Yes     Barriers   Psychosocial barriers to participate in program There are no identifiable barriers or psychosocial needs.     Screening Interventions   Interventions Encouraged to exercise       Quality of Life Scores:   PHQ-9: Recent Review Flowsheet Data    Depression screen [facility] 2/9 12/08/2015 12/28/2014 02/14/2014   Decreased Interest 0 0 0   Down, Depressed, Hopeless 0 0 0   PHQ - 2 Score 0 0 0      Psychosocial Evaluation and Intervention:     Psychosocial Evaluation - 12/08/15 1046      Psychosocial Evaluation & Interventions   Interventions Encouraged to exercise with the program and follow exercise prescription      Psychosocial Re-Evaluation:     Psychosocial Re-Evaluation    [place] Name 01/23/16 0746             Psychosocial Re-Evaluation   Interventions Encouraged to attend Pulmonary Rehabilitation for the exercise       Comments there are no psychosocial barriers to participation identified within the past 30 days         Education: Education Goals: Education classes will be provided on a weekly basis, covering required topics. Participant will state understanding/return demonstration of topics presented.  Learning Barriers/Preferences:     Learning Barriers/Preferences - 12/08/15 1035      Learning Barriers/Preferences   Learning Barriers None   Learning Preferences Computer/Internet;Group Instruction;Individual Instruction;Verbal Instruction;Written Material      Education Topics: Risk Factor Reduction:  -Group instruction that is supported by a PowerPoint presentation. Instructor discusses the definition of a risk factor, different risk factors for pulmonary disease, and how the heart and lungs work together.     Nutrition for Pulmonary Patient:  -Group instruction provided by PowerPoint slides, verbal discussion, and written materials to support subject matter. The instructor gives an explanation and review of healthy diet recommendations, which includes a discussion on weight management, recommendations for fruit and vegetable consumption, as well as protein, fluid, caffeine, fiber, sodium, sugar, and alcohol. Tips for eating when  patients are short of breath are discussed.   Pursed Lip Breathing:  -Group instruction that is supported by demonstration and informational handouts. Instructor discusses the benefits of pursed lip and diaphragmatic breathing and detailed demonstration on how to preform both.     Oxygen Safety:  -Group instruction provided by PowerPoint, verbal discussion, and written material to support subject matter. There is an overview of "What is Oxygen" and "Why do we need it".  Instructor also reviews how to  create a safe environment for oxygen use, the importance of using oxygen as prescribed, and the risks of noncompliance. There is a brief discussion on traveling with oxygen and resources the patient may utilize.   Oxygen Equipment:  -Group instruction provided by [facility] Staff utilizing handouts, written materials, and equipment demonstrations.   Signs and Symptoms:  -Group instruction provided by written material and verbal discussion to support subject matter. Warning signs and symptoms of infection, stroke, and heart attack are reviewed and when to call the physician/911 reinforced. Tips for preventing the spread of infection discussed. Flowsheet Row PULMONARY REHAB CHRONIC OBSTRUCTIVE PULMONARY DISEASE from 01/18/2016 in [place]  Date  01/11/16  Educator  RN  Instruction Review Code  2- meets goals/outcomes      Advanced Directives:  -Group instruction provided by verbal instruction and written material to support subject matter. Instructor reviews Advanced Directive laws and proper instruction for filling out document.   Pulmonary Video:  -Group video education that reviews the importance of medication and oxygen compliance, exercise, good nutrition, pulmonary hygiene, and pursed lip and diaphragmatic breathing for the pulmonary patient.   Exercise for the Pulmonary Patient:  -Group instruction that is supported by a PowerPoint presentation.  Instructor discusses benefits of exercise, core components of exercise, frequency, duration, and intensity of an exercise routine, importance of utilizing pulse oximetry during exercise, safety while exercising, and options of places to exercise outside of rehab.   Flowsheet Row PULMONARY REHAB CHRONIC OBSTRUCTIVE PULMONARY DISEASE from 01/18/2016 in [place]  Date  01/04/16  Educator  EP  Instruction Review Code  2- meets goals/outcomes      Pulmonary Medications:  -Verbally interactive group education provided by instructor with focus on inhaled medications and proper administration.   Anatomy and Physiology of the Respiratory System and Intimacy:  -Group instruction provided by PowerPoint, verbal discussion, and written material to support subject matter. Instructor reviews respiratory cycle and anatomical components of the respiratory system and their functions. Instructor also reviews differences in obstructive and restrictive respiratory diseases with examples of each. Intimacy, Sex, and Sexuality differences are reviewed with a discussion on how relationships can change when diagnosed with pulmonary disease. Common sexual concerns are reviewed.   Knowledge Questionnaire Score:   Core Components/Risk Factors/Patient Goals at Admission:     Personal Goals and Risk Factors at Admission - 12/08/15 1036      Core Components/Risk Factors/Patient Goals on Admission    Weight Management Weight Loss;Yes   Admit Weight 185 lb (83.9 kg)   Goal Weight: Long Term 170 lb (77.1 kg)   Expected Outcomes Weight Loss: Understanding of general recommendations for a balanced deficit meal plan, which promotes 1-2 lb weight loss per week and includes a negative energy balance of [phone] kcal/d;Understanding recommendations for meals to include 15-35% energy as protein, 25-35% energy from fat, 35-60% energy from carbohydrates, less than 200mg  of dietary cholesterol, 20-35  gm of total fiber daily;Understanding of distribution of calorie intake throughout the day with the consumption of 4-5 meals/snacks;Short Term: Continue to assess and modify interventions until short term weight is achieved;Long Term: Adherence to nutrition and physical activity/exercise program aimed toward attainment of established weight goal   Increase Strength and Stamina Yes   Intervention Provide advice, education, support and counseling about physical activity/exercise needs.;Develop an individualized exercise prescription for aerobic and resistive training based on initial evaluation findings, risk stratification, comorbidities and participant's personal goals.   Expected  Outcomes Achievement of increased cardiorespiratory fitness and enhanced flexibility, muscular endurance and strength shown through measurements of functional capacity and personal statement of participant.   Improve shortness of breath with ADL's Yes   Intervention Provide education, individualized exercise plan and daily activity instruction to help decrease symptoms of SOB with activities of daily living.   Expected Outcomes Short Term: Achieves a reduction of symptoms when performing activities of daily living.   Develop more efficient breathing techniques such as purse lipped breathing and diaphragmatic breathing; and practicing self-pacing with activity Yes   Intervention Provide education, demonstration and support about specific breathing techniuqes utilized for more efficient breathing. Include techniques such as pursed lipped breathing, diaphragmatic breathing and self-pacing activity.   Expected Outcomes Short Term: Participant will be able to demonstrate and use breathing techniques as needed throughout daily activities.   Increase knowledge of respiratory medications and ability to use respiratory devices properly  Yes   Intervention Provide education and demonstration as needed of appropriate use of medications,  inhalers, and oxygen therapy.   Expected Outcomes Short Term: Achieves understanding of medications use. Understands that oxygen is a medication prescribed by physician. Demonstrates appropriate use of inhaler and oxygen therapy.      Core Components/Risk Factors/Patient Goals Review:      Goals and Risk Factor Review    Row Name 01/18/16 1155 01/23/16 0742           Core Components/Risk Factors/Patient Goals Review   Personal Goals Review Weight Management/Obesity Increase knowledge of respiratory medications and ability to use respiratory devices properly.;Sedentary;Improve shortness of breath with ADL's;Increase Strength and Stamina;Develop more efficient breathing techniques such as purse lipped breathing and diaphragmatic breathing and practicing self-pacing with activity.      Review see 01/18/16 RD note see "comments" section on ITP      Expected Outcomes Pt to maintain his wt around 178 lb while in Pulmonary Rehab see "Admission" expected outcomes         Core Components/Risk Factors/Patient Goals at Discharge (Final Review):      Goals and Risk Factor Review - 01/23/16 0742      Core Components/Risk Factors/Patient Goals Review   Personal Goals Review Increase knowledge of respiratory medications and ability to use respiratory devices properly.;Sedentary;Improve shortness of breath with ADL's;Increase Strength and Stamina;Develop more efficient breathing techniques such as purse lipped breathing and diaphragmatic breathing and practicing self-pacing with activity.   Review see "comments" section on ITP   Expected Outcomes see "Admission" expected outcomes      ITP Comments:   Comments: ITP REVIEW Pt is making expected progress toward pulmonary rehab goals after completing 8 sessions. Recommend continued exercise, life style modification, education, and utilization of breathing techniques to increase stamina and strength and decrease shortness of breath with  exertion.

## 2016-01-25 ENCOUNTER — Telehealth: Payer: Self-pay | Admitting: Internal Medicine

## 2016-01-25 ENCOUNTER — Ambulatory Visit (INDEPENDENT_AMBULATORY_CARE_PROVIDER_SITE_OTHER)
Admission: RE | Admit: 2016-01-25 | Discharge: 2016-01-25 | Disposition: A | Discharge status: Home / Self Care | Payer: Medicare Other | Source: Ambulatory Visit | Attending: Internal Medicine | Admitting: Internal Medicine

## 2016-01-25 ENCOUNTER — Encounter: Payer: Self-pay | Admitting: Internal Medicine

## 2016-01-25 ENCOUNTER — Encounter (HOSPITAL_COMMUNITY): Payer: Medicare Other

## 2016-01-25 ENCOUNTER — Ambulatory Visit (INDEPENDENT_AMBULATORY_CARE_PROVIDER_SITE_OTHER): Payer: Medicare Other | Admitting: Internal Medicine

## 2016-01-25 VITALS — BP 126/84 | HR 55 | Ht 73.5 in | Wt 175.0 lb

## 2016-01-25 DIAGNOSIS — I10 Essential (primary) hypertension: Secondary | ICD-10-CM | POA: Diagnosis not present

## 2016-01-25 DIAGNOSIS — J449 Chronic obstructive pulmonary disease, unspecified: Secondary | ICD-10-CM | POA: Diagnosis not present

## 2016-01-25 MED ORDER — BISOPROLOL-HYDROCHLOROTHIAZIDE 2.5-6.25 MG PO TABS: 1.0000 | ORAL_TABLET | Freq: Every day | ORAL | 11 refills | Status: DC

## 2016-01-25 MED ORDER — PREDNISONE 10 MG PO TABS: ORAL_TABLET | ORAL | 0 refills | Status: DC

## 2016-01-25 NOTE — Progress Notes (Signed)
Subjective:     Patient ID: Jeremiah [name], male   DOB: [birth year]   MRN: [id number]    Brief patient profile:  70 yobm quit smoking 1989 with no problems with variable sob x 2004 and maintained on inhalers referred 05/03/2013 by Dr Titus Mould for evaluation of outpt management for copd after hospitalization and found to have GOLD III COPD 01/2014  with tendency to panic when sob.  Admit date: 04/24/2013  Discharge date: 04/27/2013  Discharge Diagnoses:  Active Problems:  COPD exacerbation  Detailed Hospital Course:  70 yo male with abrupt onset of sob (1/10) that progressively worsened until EMS called. Patient intubated in ED due to severe respiratory distress. In route, patient had increasing tachypnea, decline SpO2, and obtundation. Patient with GCS <8 at arrival, evidence of emesis, intubated for severe respiratory distress. Family reported no evidence of fevers, chills, malaise, worsening coughing, and sob. Initial episode passed, however sister received a call later with him reporting a return of symptoms, complaints of inability of breathing and EMS alerted. Distant history of tobacco abuse. Has never been hospitalized for COPD before this episode. He has had no recent hospitalizations, and has never been previously intubated.  He was admitted to the ICU on 1/11 and was treated with the typical modalities for acute respiratory failure (for presumed AECODP) which included mechanical ventilation with sedation, inhaled bronchodilators, antibiotics, and systemic steroid therapy. He improved later that day and was able to be extubated and sedation was stopped. His care since that point has primarily focused on the de-escalation of the therapies mentioned above. On 1/13 he has returned to a near-baseline state of health and has been deemed a candidate for discharge.  Discharge Plan by diagnoses  Acute exacerbation of COPD  Acute hypercarbic respiratory failure (resolved)  plan  -Follow up with Dr. Melvyn Novas  on 05/03/2013 at 2:30PM  -Change steroids to PO Prednisone, taper over 7-10d  -Resume home dose BDs    History of Present Illness  05/03/2013 1st [facility] office visit/ Abrahan Fulmore cc more sob since Oct 2014 on symbicort 160 2 bid and spiriva daily  But still  daily use of proventil and no need for neb then needed neb x sev months prior to his admit above but back to baseline since d/c on pred still tapering off but using saba qid, not prn "out of habit" >>return for PFT   01/03/2014 Follow up  [name] .  Complains of increased episodes of increased DOE, wheezing, prod cough with white/clear mucus worse for last week. Family member says he has been progressively get worse over last year. Was admitted 04/2013 w/ COPD flare /VDRF . [name] from last office visit however did not follow up .   Followed at [facility] , says he was told he has Moderate COPD 6 yrs ago. On Symbicort and Spiriva.  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  Says he gets winded easily esp on incline.  Says worse for last week with increased cough and wheezing.  No hemotpysis, wt loss, calf pain, n/v/d, chest pain or fever.  Last abx ~2 months ago.  rec Doxycycline 100mg  Twice daily  For 7 days  Prednisone taper over next week Mucinex DM Twice daily  As needed  Cough /congestion  Continue on Symbicort and Spiriva .    01/24/2014 f/u ov/June Vacha re:  GOLD III Chief  Complaint  Patient presents with  . Follow-up    PFT done today. Pt states that SOB and cough have improved some, but not back to normal baseline. Cough is prod with minimal clear sputum.   can do HT but no mall due to sob Prednisone really helped a lot but did not maintain improvement once finished >>pred taper add ppi bid and diet     12/12/2014 acute  ov/Adriane Gabbert re: ? aecopd /new gi/gu complaints ? From spiriva?   Chief Complaint  Patient presents  with  . Acute Visit    Pt c/o increased SOB and cough x 4 days. Cough is not very prod- clear sputum. He also c/o increased gas and bloating.   downhill since prev ov, changing his own maint rx (dc'd pepcid) >  severe cough at hs onset 12/09/14  On spiriva lots of gi/gu issues  Started pred one day prior to OV  As per calendar action plan  Has only used saba so far in hfa form, has neb but doesn't feel he needs it yet.  >changed spiriva to [place]     04/04/2015  f/u ov/Davion Flannery re: copd  Chief Complaint  Patient presents with  . Follow-up    pt following for COPD: pt states hes been its been on and off. pt states 3 weekws ago he was diagnosed with bronchitits but he feels much better. pt c/o a dry cough thats been bothering him mostly in the night time. pt states at times he has to take the CPAP off because he has these couhing fits. no c/o SOB , wheezing, or chest tightness.   has med calendar but not following the action plans at the bottom for cough  > For cough/ congestion > mucinex dm and flutter valve       07/20/2015  f/u ov/Hera Celaya re: aecopd/ using med calendar though not flutter or approp dose of mucinex dm/ confused with when to use pred  Chief Complaint  Patient presents with  . Follow-up    pt c/o nonprod cough, chest congestion, sob X4-5 days.  S/s worse qhs.    last prednisone finished on 07/16/15 and getting worse again in terms of breathing and dry coughing rec In the event that you need your nebulizer more than rarely for your breathing: prednisone 20 mg daily until better  Then 10 mg daily x 5 days and stop For cough > mucinex dm 1200 mg every 12 hours and use the flutter valve Work on inhaler technique    08/29/2015  f/u ov/Murlene Revell re:  GOLD III  symbicort 160 2bid/ tudorza  Chief Complaint  Patient presents with  . Follow-up    Cough has not improved since the last visit. He uses ventolin at least once per day and neb with albuterol 2 x daily on average.   Still using  oil based vit D, following med calendar better / rarely taking prednisone but helps when does  Lots of hoarseness and dry cough  Day >   >stop [place] and begin Spiriva     11/01/2015 NP  Acute OV  Presents for an acute office visit Patient complains of 2 weeks of increased cough, shortness of breath and wheezing. Feels that the high temperatures and humidity or making his breathing worse. Wears out with minimum activity   Denies any chest congestion/tightness, sinus pressure/drainage, fever, nausea or vomiting.  Has any fever or discolored mucus. Remains on Symbicort and Spiriva. Did start on prednisone over the last 1-2  days. Still has persistent cough and wheezing. Cough is keeping him up at night.. Patient and wife have multiple questions about his severity of his underlying COPD. He went over purse lip breathing. He would like to try pulmonary rehabilitation again. rec Zpack to have on hold if symtpoms worsen with discolored mucus.  Prednisone taper over next week.  Hydromet 1 tsp every 6hr, As needed  Cough , may make sleepy.  Refer to pulmonary rehab    12/25/2015  f/u ov/Jaxsen Bernhart re: GOLD III copd/ symb/spiriva - using med calendar well  Chief Complaint  Patient presents with  . Follow-up    Doing well and denies any co's today.   doe [facility] = can't walk a nl pace on a flat grade s sob but does fine slow and flat eg  WM shopping ok p using [facility] parking - rare saba need -has not needed pred recently main complaint is abd distention/ constipation  rec Try spiriva just one puff each am to see if bloating/constipation improve  and if so leave the dose just at one pff - if not ok try off completely after a few weeks of one pff but if off it makes no difference at all then just restart it at 2 each am as per med calendar  See calendar for specific medication instructions    01/17/2016 acute extended ov/Caleen Taaffe re: GOLD III/ aecopd  Chief Complaint  Patient presents with  . Acute Visit     Pt c/o cough x 5 days- prod with min clear sputum.  He had nosebleed last night.    severe cough onset 10/1 > min white mucus, worse at hs  Confused with names of meds and correlating them with med calendar though did bring it with him  Did not previously disclose use of tessalon but says now he was using it prn and worked fine until it ran out  Note note now on tenoretic, also not on med calendar  rec For dry cough as per med calendar > tessalon pearls 200 mg every 4-6 hours as needed and use the flutter valve For congested cough > mucinex dm up to 1200  Every 12 hours and use the flutter valve also  Increase the spiriva back to 2 puffs each am  See calendar for specific medication instructions   01/25/2016 acute extended ov/Madell Heino re: copd gold III/ chronic dry cough  Chief Complaint  Patient presents with  . Acute Visit    Pt. c/o coughing on going  for couple of months, frequent coughing spills, some clear mucus, some sob, some wheezing, some chest pain after having coughing spells   improving ex tol at rehab s 02 Cough is worse around 3 am nightly x months but no excess mucus/ just dry hack ? Really using pepcid at hs as per med calendar  Has pred to take for flare of sob but not cough so has not tried it yet    No obvious day to day or daytime variability or assoc purulent sputum or mucus plugs or hemoptysis or cp or chest tightness, subjective wheeze or overt sinus or hb symptoms. No unusual exp hx or h/o childhood pna/ asthma or knowledge of premature birth.  Sleeping ok without nocturnal  or early am exacerbation  of respiratory  c/o's or need for noct saba. Also denies any obvious fluctuation of symptoms with weather or environmental changes or other aggravating or alleviating factors except as outlined above   Current Medications, Allergies, Complete Past  Medical History, Past Surgical History, Family History, and Social History were reviewed in [employer]  record.  ROS  The following are not active complaints unless bolded sore throat, dysphagia, dental problems, itching, sneezing,  nasal congestion or excess/ purulent secretions, ear ache,   fever, chills, sweats, unintended wt loss, classically pleuritic or exertional cp,  orthopnea pnd or leg swelling, presyncope, palpitations, abdominal pain, anorexia, nausea, vomiting, diarrhea  or change in bowel or bladder habits, change in stools or urine, dysuria,hematuria,  rash, arthralgias, visual complaints, headache, numbness, weakness or ataxia or problems with walking or coordination,  change in mood/affect or memory.            Objective:       Physical Exam  GEN: A/Ox3; pleasant , NAD, elderly , thin very anxious bm nad      07/07/2014        188  > 07/20/2014     182 08/03/2014 >>    11/01/2014  181 > 12/12/2014   177 >182 01/03/2015 >  04/04/2015  183 >04/28/2015 184 > 08/29/2015  181 > 12/25/2015  177> 01/25/2016   175    Vital signs reviewed - sats 95% on RA on arrival      HEENT:  [place]/AT,  EACs-clear, TMs-wnl, NOSE-clear, THROAT-clear, no lesions, no postnasal drip or exudate noted.  Top dentures -   NECK:  Supple w/ fair ROM; no JVD; normal carotid impulses w/o bruits; no thyromegaly or nodules palpated; no lymphadenopathy.    RESP     .  no accessory muscle use, no dullness to percussion- distant bs bilaterally with increased Texp  CARD:  RRR, no m/r/g  , no peripheral edema, pulses intact, no cyanosis or clubbing.  GI:   Min distended but Soft & nt; nml bowel sounds; no organomegaly or masses detected.   Musco: Warm bil, no deformities or joint swelling noted.   Neuro: alert, no focal deficits noted.    Skin: Warm, no lesions or rashes   CXR PA and Lateral:   01/25/2016 :    I personally reviewed images and agree with radiology impression as follows:

## 2016-01-25 NOTE — Telephone Encounter (Signed)
Called spoke with pt's wife. She states that the patient has a severe cough and is requesting to be seen by MW or TP. She states they are going out of town tomorrow to [place]. I scheduled ov with MW at 10:30am. She voiced understanding and had no further questions. Nothing further needed.

## 2016-01-25 NOTE — Assessment & Plan Note (Signed)
Referred to internal medicine for f/u 12/12/2014  - changed atenolol to bisoprolol due to refractory noct cough 01/25/2016   Strongly prefer in this setting: Bystolic, the most beta -1  selective Beta blocker available in sample form, with bisoprolol the most selective generic choice  on the market.   Try bisoprolol 2.5 / 6.25 daily > ok to to double is not well controlled

## 2016-01-25 NOTE — Progress Notes (Signed)
LMOM with results

## 2016-01-25 NOTE — Patient Instructions (Addendum)
For flares of breathing or coughing > prednisone 10 mg take  4 each am x 2 days,   2 each am x 2 days,  1 each am x 2 days and stop  As per med calendar   For cough ok to take tessalon up to 200 mg every 4 hours as needed as per med calendar  Stop atenolol and start bisoprolol hct instead one daily as per med calendar   Please remember to go to the x-ray department downstairs for your tests - we will call you with the results when they are available.  Keep appt to see Tammy NP for new med calendar

## 2016-01-28 NOTE — Assessment & Plan Note (Addendum)
-   PFTs 01/24/2014  FEV1  1.30 (35%) ratio 49 with severe air trapping and no better p saba and dlco 48% -01/24/2014 p extensive coaching HFA effectiveness =    90%  - referred to rehab 01/24/14  -Med calendar 02/21/2014 > did not recognize it when reprinted 03/29/14 > reviewed , 08/03/2014 , 04/28/2015   - 07/07/14 rec short course prednisone for flares built into action plan  - d/c spiriva 12/12/2014 > trial of tudorza due to gi/gu side effects - 07/20/2015  p extensive coaching HFA effectiveness =    90% from a baseline 75%  - 07/20/2015 changed prednisone to 20 mg per day until better, then taper off with trigger by need for neb - 08/29/2015 changed tudorza to spiriva respimat  09/12/2015 med calendar - 12/25/2015 rec wean spiriva to see what effect if any has on abd distention> not change 01/17/2016 so increased back to 2 pffs qam   Actually improving with ex tol at rehab so no change in rx needed though did add cough to list of symptoms that should trigger short course of prn pred on action plan  I had an extended discussion with the patient reviewing all relevant studies completed to date and  lasting 15 to 20 minutes of a 25 minute visit    Each maintenance medication was reviewed in detail including most importantly the difference between maintenance and prns and under what circumstances the prns are to be triggered using an action plan format that is not reflected in the computer generated alphabetically organized AVS but trather by a customized med calendar that reflects the AVS meds with confirmed 100% correlation.   Please see instructions for details which were reviewed in writing and the patient given a copy highlighting the part that I personally wrote and discussed at today's ov.

## 2016-01-30 ENCOUNTER — Encounter (HOSPITAL_COMMUNITY)
Admission: RE | Admit: 2016-01-30 | Discharge: 2016-01-30 | Disposition: A | Discharge status: Home / Self Care | Payer: Medicare Other | Source: Ambulatory Visit | Attending: Internal Medicine | Admitting: Internal Medicine

## 2016-01-30 VITALS — Wt 178.1 lb

## 2016-01-30 DIAGNOSIS — J449 Chronic obstructive pulmonary disease, unspecified: Secondary | ICD-10-CM

## 2016-01-30 DIAGNOSIS — J438 Other emphysema: Secondary | ICD-10-CM

## 2016-01-30 NOTE — Progress Notes (Signed)
Daily Session Note  Patient Details  Name: Jeremiah [name] MRN: [id number] Date of Birth: 1945-05-13 Referring Provider:   [name] Pulmonary Rehab Walk Test from 12/12/2015 in [place]  Referring Provider  Dr. Melvyn Novas      Encounter Date: 01/30/2016  Check In:     Session Check In - 01/30/16 1028      Check-In   Location [facility]   Staff Present Su Hilt, MS, ACSM RCEP, Exercise Physiologist;Joan Leonia Reeves, RN, Luisa Hart, RN, BSN;Ramon Dredge, RN, [facility]   Supervising physician immediately available to respond to emergencies [facility] immediately available   Physician(s) Dr. Alfredia Ferguson   Medication changes reported     No   Fall or balance concerns reported    No   Warm-up and Cool-down Performed as group-led instruction   Resistance Training Performed Yes   VAD Patient? No     Pain Assessment   Currently in Pain? No/denies   Multiple Pain Sites No      Capillary Blood Glucose: No results found for this or any previous visit (from the past 24 hour(s)).      Exercise Prescription Changes - 01/30/16 1200      Response to Exercise   Blood Pressure (Admit) 140/80   Blood Pressure (Exercise) 140/84   Blood Pressure (Exit) 122/82   Heart Rate (Admit) 64 bpm   Heart Rate (Exercise) 86 bpm   Heart Rate (Exit) 69 bpm   Oxygen Saturation (Admit) 98 %   Oxygen Saturation (Exercise) 97 %   Oxygen Saturation (Exit) 97 %   Rating of Perceived Exertion (Exercise) 11   Perceived Dyspnea (Exercise) 2   Duration Progress to 45 minutes of aerobic exercise without signs/symptoms of physical distress   Intensity THRR unchanged     Progression   Progression Continue to progress workloads to maintain intensity without signs/symptoms of physical distress.     Resistance Training   Training Prescription Yes   Weight green bands   Reps 10-12  10 minutes of strength training     Interval Training   Interval Training No     Treadmill   MPH 2.2   Grade 2   Minutes 17     NuStep   Level 5   Minutes 17   METs 3.6     Rower   Level 3   Minutes 17     Goals Met:  Exercise tolerated well No report of cardiac concerns or symptoms Strength training completed today  Goals Unmet:  Not Applicable  Comments: Service time is from 10:30AM to 12:05PM    Dr. Rush Farmer is Medical Director for Pulmonary Rehab at [facility].

## 2016-02-01 ENCOUNTER — Encounter (HOSPITAL_COMMUNITY)
Admission: RE | Admit: 2016-02-01 | Discharge: 2016-02-01 | Disposition: A | Discharge status: Home / Self Care | Payer: Medicare Other | Source: Ambulatory Visit | Attending: Internal Medicine | Admitting: Internal Medicine

## 2016-02-01 VITALS — Wt 179.7 lb

## 2016-02-01 DIAGNOSIS — J438 Other emphysema: Secondary | ICD-10-CM

## 2016-02-01 DIAGNOSIS — J449 Chronic obstructive pulmonary disease, unspecified: Secondary | ICD-10-CM | POA: Diagnosis not present

## 2016-02-01 NOTE — Progress Notes (Signed)
Daily Session Note  Patient Details  Name: Jeremiah [name] MRN: [id number] Date of Birth: 02-12-1946 Referring Provider:   [name] Pulmonary Rehab Walk Test from 12/12/2015 in [place]  Referring Provider  Dr. Melvyn Novas      Encounter Date: 02/01/2016  Check In:     Session Check In - 02/01/16 1030      Check-In   Location [facility]   Staff Present Rosebud Poles, RN, BSN;Molly diVincenzo, MS, ACSM RCEP, Exercise Physiologist;Annedrea Rosezella [place], RN, MHA;Portia Rollene Rotunda, RN, BSN   Supervising physician immediately available to respond to emergencies [facility] immediately available   Physician(s) Dr. Rockne Menghini   Medication changes reported     No   Fall or balance concerns reported    No   Warm-up and Cool-down Performed as group-led instruction   Resistance Training Performed Yes   VAD Patient? No     Pain Assessment   Currently in Pain? No/denies   Multiple Pain Sites No      Capillary Blood Glucose: No results found for this or any previous visit (from the past 24 hour(s)).      Exercise Prescription Changes - 02/01/16 1200      Response to Exercise   Blood Pressure (Admit) 104/50   Blood Pressure (Exercise) 122/70   Blood Pressure (Exit) 116/56   Heart Rate (Admit) 65 bpm   Heart Rate (Exercise) 82 bpm   Heart Rate (Exit) 64 bpm   Oxygen Saturation (Admit) 96 %   Oxygen Saturation (Exercise) 92 %   Oxygen Saturation (Exit) 97 %   Rating of Perceived Exertion (Exercise) 11   Perceived Dyspnea (Exercise) 1   Duration Progress to 45 minutes of aerobic exercise without signs/symptoms of physical distress   Intensity THRR unchanged     Progression   Progression Continue to progress workloads to maintain intensity without signs/symptoms of physical distress.     Resistance Training   Training Prescription Yes   Weight green bands   Reps 10-12  10 minutes of strength training     Interval Training   Interval  Training No     Treadmill   MPH 2.2   Grade 2   Minutes 17     NuStep   Level 5   Minutes 17   METs 3.8     Goals Met:  Exercise tolerated well Strength training completed today  Goals Unmet:  Not Applicable  Comments: Service time is from 1030 to 1200    Dr. Rush Farmer is Medical Director for Pulmonary Rehab at [facility].

## 2016-02-06 ENCOUNTER — Encounter (HOSPITAL_COMMUNITY)
Admission: RE | Admit: 2016-02-06 | Discharge: 2016-02-06 | Disposition: A | Discharge status: Home / Self Care | Payer: Medicare Other | Source: Ambulatory Visit | Attending: Internal Medicine | Admitting: Internal Medicine

## 2016-02-06 VITALS — Wt 183.0 lb

## 2016-02-06 DIAGNOSIS — J438 Other emphysema: Secondary | ICD-10-CM

## 2016-02-06 DIAGNOSIS — J449 Chronic obstructive pulmonary disease, unspecified: Secondary | ICD-10-CM

## 2016-02-06 NOTE — Progress Notes (Signed)
Daily Session Note  Patient Details  Name: Jeremiah [name] MRN: [id number] Date of Birth: 29-Nov-1945 Referring Provider:   [name] Pulmonary Rehab Walk Test from 12/12/2015 in [place]  Referring Provider  Dr. Melvyn Novas      Encounter Date: 02/06/2016  Check In:     Session Check In - 02/06/16 1030      Check-In   Location [facility]   Staff Present Rosebud Poles, RN, BSN;Matan Steen, MS, ACSM RCEP, Exercise Physiologist;[name] Ysidro Evert, RN;Portia Rollene Rotunda, RN, BSN   Supervising physician immediately available to respond to emergencies [facility] immediately available   Physician(s) Dr. Rockne Menghini   Medication changes reported     No   Fall or balance concerns reported    No   Warm-up and Cool-down Performed as group-led instruction   Resistance Training Performed Yes   VAD Patient? No     Pain Assessment   Currently in Pain? No/denies   Multiple Pain Sites No      Capillary Blood Glucose: No results found for this or any previous visit (from the past 24 hour(s)).      Exercise Prescription Changes - 02/06/16 1200      Exercise Review   Progression Yes     Response to Exercise   Blood Pressure (Admit) 128/74   Blood Pressure (Exercise) 140/70   Blood Pressure (Exit) 114/62   Heart Rate (Admit) 62 bpm   Heart Rate (Exercise) 83 bpm   Heart Rate (Exit) 61 bpm   Oxygen Saturation (Admit) 99 %   Oxygen Saturation (Exercise) 93 %   Oxygen Saturation (Exit) 97 %   Rating of Perceived Exertion (Exercise) 12   Perceived Dyspnea (Exercise) 2   Duration Progress to 45 minutes of aerobic exercise without signs/symptoms of physical distress   Intensity THRR unchanged     Progression   Progression Continue to progress workloads to maintain intensity without signs/symptoms of physical distress.     Resistance Training   Training Prescription Yes   Weight BLUE BANDS   Reps 10-12  10 minutes of strength training     Interval Training   Interval Training No     Treadmill   MPH 2.5   Grade 3   Minutes 17     NuStep   Level 5   Minutes 17   METs 2.6     Rower   Level 3   Minutes 17     Goals Met:  Personal goals reviewed No report of cardiac concerns or symptoms Strength training completed today  Goals Unmet:  Not Applicable  Comments: Service time is from 10:30AM to 12:00PM    Dr. Rush Farmer is Medical Director for Pulmonary Rehab at [facility].

## 2016-02-08 ENCOUNTER — Encounter (HOSPITAL_COMMUNITY)
Admission: RE | Admit: 2016-02-08 | Discharge: 2016-02-08 | Disposition: A | Discharge status: Home / Self Care | Payer: Medicare Other | Source: Ambulatory Visit | Attending: Internal Medicine | Admitting: Internal Medicine

## 2016-02-08 ENCOUNTER — Telehealth: Payer: Self-pay | Admitting: Internal Medicine

## 2016-02-08 VITALS — Wt 179.5 lb

## 2016-02-08 DIAGNOSIS — J449 Chronic obstructive pulmonary disease, unspecified: Secondary | ICD-10-CM

## 2016-02-08 DIAGNOSIS — J438 Other emphysema: Secondary | ICD-10-CM

## 2016-02-08 NOTE — Telephone Encounter (Signed)
MW are you okay with pt switching care to MR? Thanks.

## 2016-02-08 NOTE — Telephone Encounter (Signed)
LMTCB

## 2016-02-08 NOTE — Telephone Encounter (Signed)
Yes sure. Please have him do spiro pre- and post -bd before he sees me.   THanks  Dr. Brand Males, M.D., [facility].C.P Pulmonary and Critical Care Medicine Staff Physician [place] [facility] Pager: [phone], If no answer or between  15:00h - 7:00h: call [phone]  02/08/2016 10:16 AM

## 2016-02-08 NOTE — Progress Notes (Signed)
Daily Session Note  Patient Details  Name: Jeremiah [name] MRN: [id number] Date of Birth: 1945/09/02 Referring Provider:   [name] Pulmonary Rehab Walk Test from 12/12/2015 in [place]  Referring Provider  Dr. Melvyn Novas      Encounter Date: 02/08/2016  Check In:     Session Check In - 02/08/16 1032      Check-In   Location [facility]   Staff Present Rosebud Poles, RN, Luisa Hart, RN, BSN;Cason Dabney, MS, ACSM RCEP, Exercise Physiologist;[name] Ysidro Evert, RN   Supervising physician immediately available to respond to emergencies [facility] immediately available   Physician(s) Dr. Alfredia Ferguson   Medication changes reported     No   Fall or balance concerns reported    No   Warm-up and Cool-down Performed as group-led instruction   Resistance Training Performed Yes   VAD Patient? No     Pain Assessment   Currently in Pain? No/denies   Multiple Pain Sites No      Capillary Blood Glucose: No results found for this or any previous visit (from the past 24 hour(s)).      Exercise Prescription Changes - 02/08/16 1200      Response to Exercise   Blood Pressure (Admit) 122/70   Blood Pressure (Exercise) 110/70   Blood Pressure (Exit) 124/80   Heart Rate (Admit) 75 bpm   Heart Rate (Exercise) 71 bpm   Heart Rate (Exit) 65 bpm   Oxygen Saturation (Admit) 94 %   Oxygen Saturation (Exercise) 94 %   Oxygen Saturation (Exit) 97 %   Rating of Perceived Exertion (Exercise) 13   Perceived Dyspnea (Exercise) 2   Duration Progress to 45 minutes of aerobic exercise without signs/symptoms of physical distress   Intensity THRR unchanged     Progression   Progression Continue to progress workloads to maintain intensity without signs/symptoms of physical distress.     Resistance Training   Training Prescription Yes   Weight BLUE BANDS   Reps 10-12  10 minutes of strength training     Interval Training   Interval Training No      Treadmill   MPH 2.5   Grade 3   Minutes 17     Rower   Level 3   Minutes 17     Goals Met:  Exercise tolerated well No report of cardiac concerns or symptoms Strength training completed today  Goals Unmet:  Not Applicable  Comments: Service time is from 10:30am to 12:30pm  Patient attended education today with Dr. Nelda Marseille   Dr. Rush Farmer is Medical Director for Pulmonary Rehab at [facility].

## 2016-02-08 NOTE — Telephone Encounter (Signed)
Fine with me

## 2016-02-08 NOTE — Telephone Encounter (Signed)
Called and spoke to pt's wife, Jeremiah [name]. Pt is requesting to change providers from Dr. Melvyn Novas to Dr. Chase Caller. When calling pt back the pt and his wife request a call back to the number [phone], [name]'s cell phone number.   Dr. Melvyn Novas, ok to change providers from MW to MR?  Dr. Chase Caller, ok to accept pt?

## 2016-02-08 NOTE — Progress Notes (Signed)
Jeremiah [name] presents to pulmonary rehab for his bi-weekly exercise session. I have completed his thirty day face to face review and determined that Jeremiah [name] is on track for meeting their pulmonary rehab goals. There are no barriers identified that will prevent them from continuing their exercise in pulmonary rehab as prescribed.   Rush Farmer, M.D. [facility] Pulmonary/Critical Care Medicine. Pager: [phone]. After hours pager: [phone].

## 2016-02-09 DIAGNOSIS — M25571 Pain in right ankle and joints of right foot: Secondary | ICD-10-CM | POA: Diagnosis not present

## 2016-02-09 DIAGNOSIS — M792 Neuralgia and neuritis, unspecified: Secondary | ICD-10-CM | POA: Diagnosis not present

## 2016-02-09 NOTE — Telephone Encounter (Signed)
Spoke with pt. He has been scheduled to see MR and to have PFT on 03/22/16. Order has been placed for pre & post. Nothing further was needed.

## 2016-02-12 ENCOUNTER — Encounter: Payer: Self-pay | Admitting: Adult Health

## 2016-02-12 ENCOUNTER — Ambulatory Visit (INDEPENDENT_AMBULATORY_CARE_PROVIDER_SITE_OTHER): Payer: Medicare Other | Admitting: Adult Health

## 2016-02-12 VITALS — BP 130/72 | HR 60 | Temp 97.7°F | Ht 73.5 in | Wt 183.2 lb

## 2016-02-12 DIAGNOSIS — R059 Cough, unspecified: Secondary | ICD-10-CM

## 2016-02-12 DIAGNOSIS — R05 Cough: Secondary | ICD-10-CM

## 2016-02-12 DIAGNOSIS — J449 Chronic obstructive pulmonary disease, unspecified: Secondary | ICD-10-CM

## 2016-02-12 NOTE — Progress Notes (Signed)
Subjective:     Patient ID: Jeremiah [name], male   DOB: June 13, 1945   MRN: [id number]    Brief patient profile:  85 yobm quit smoking 1989 with no problems with variable sob x 2004 and maintained on inhalers referred 05/03/2013 by Dr Titus Mould for evaluation of outpt management for copd after hospitalization and found to have GOLD III COPD 01/2014  with tendency to panic when sob.  Admit date: 04/24/2013  Discharge date: 04/27/2013  Discharge Diagnoses:  Active Problems:  COPD exacerbation  Detailed Hospital Course:  70 yo male with abrupt onset of sob (1/10) that progressively worsened until EMS called. Patient intubated in ED due to severe respiratory distress. In route, patient had increasing tachypnea, decline SpO2, and obtundation. Patient with GCS <8 at arrival, evidence of emesis, intubated for severe respiratory distress. Family reported no evidence of fevers, chills, malaise, worsening coughing, and sob. Initial episode passed, however sister received a call later with him reporting a return of symptoms, complaints of inability of breathing and EMS alerted. Distant history of tobacco abuse. Has never been hospitalized for COPD before this episode. He has had no recent hospitalizations, and has never been previously intubated.  He was admitted to the ICU on 1/11 and was treated with the typical modalities for acute respiratory failure (for presumed AECODP) which included mechanical ventilation with sedation, inhaled bronchodilators, antibiotics, and systemic steroid therapy. He improved later that day and was able to be extubated and sedation was stopped. His care since that point has primarily focused on the de-escalation of the therapies mentioned above. On 1/13 he has returned to a near-baseline state of health and has been deemed a candidate for discharge.  Discharge Plan by diagnoses  Acute exacerbation of COPD  Acute hypercarbic respiratory failure (resolved)  plan  -Follow up with Dr. Melvyn Novas  on 05/03/2013 at 2:30PM  -Change steroids to PO Prednisone, taper over 7-10d  -Resume home dose BDs    History of Present Illness  05/03/2013 1st [facility] office visit/ Wert cc more sob since Oct 2014 on symbicort 160 2 bid and spiriva daily  But still  daily use of proventil and no need for neb then needed neb x sev months prior to his admit above but back to baseline since d/c on pred still tapering off but using saba qid, not prn "out of habit" >>return for PFT   01/03/2014 Follow up  [name] .  Complains of increased episodes of increased DOE, wheezing, prod cough with white/clear mucus worse for last week. Family member says he has been progressively get worse over last year. Was admitted 04/2013 w/ COPD flare /VDRF . [name] from last office visit however did not follow up .   Followed at [facility] , says he was told he has Moderate COPD 6 yrs ago. On Symbicort and Spiriva.  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  Says he gets winded easily esp on incline.  Says worse for last week with increased cough and wheezing.  No hemotpysis, wt loss, calf pain, n/v/d, chest pain or fever.  Last abx ~2 months ago.  rec Doxycycline 100mg  Twice daily  For 7 days  Prednisone taper over next week Mucinex DM Twice daily  As needed  Cough /congestion  Continue on Symbicort and Spiriva .    01/24/2014 f/u ov/Wert re:  GOLD III Chief  Complaint  Patient presents with  . Follow-up    PFT done today. Pt states that SOB and cough have improved some, but not back to normal baseline. Cough is prod with minimal clear sputum.   can do HT but no mall due to sob Prednisone really helped a lot but did not maintain improvement once finished >>pred taper add ppi bid and diet     12/12/2014 acute  ov/Wert re: ? aecopd /new gi/gu complaints ? From spiriva?   Chief Complaint  Patient presents  with  . Acute Visit    Pt c/o increased SOB and cough x 4 days. Cough is not very prod- clear sputum. He also c/o increased gas and bloating.   downhill since prev ov, changing his own maint rx (dc'd pepcid) >  severe cough at hs onset 12/09/14  On spiriva lots of gi/gu issues  Started pred one day prior to OV  As per calendar action plan  Has only used saba so far in hfa form, has neb but doesn't feel he needs it yet.  >changed spiriva to [place]     04/04/2015  f/u ov/Wert re: copd  Chief Complaint  Patient presents with  . Follow-up    pt following for COPD: pt states hes been its been on and off. pt states 3 weekws ago he was diagnosed with bronchitits but he feels much better. pt c/o a dry cough thats been bothering him mostly in the night time. pt states at times he has to take the CPAP off because he has these couhing fits. no c/o SOB , wheezing, or chest tightness.   has med calendar but not following the action plans at the bottom for cough  > For cough/ congestion > mucinex dm and flutter valve       07/20/2015  f/u ov/Wert re: aecopd/ using med calendar though not flutter or approp dose of mucinex dm/ confused with when to use pred  Chief Complaint  Patient presents with  . Follow-up    pt c/o nonprod cough, chest congestion, sob X4-5 days.  S/s worse qhs.    last prednisone finished on 07/16/15 and getting worse again in terms of breathing and dry coughing rec In the event that you need your nebulizer more than rarely for your breathing: prednisone 20 mg daily until better  Then 10 mg daily x 5 days and stop For cough > mucinex dm 1200 mg every 12 hours and use the flutter valve Work on inhaler technique    08/29/2015  f/u ov/Wert re:  GOLD III  symbicort 160 2bid/ tudorza  Chief Complaint  Patient presents with  . Follow-up    Cough has not improved since the last visit. He uses ventolin at least once per day and neb with albuterol 2 x daily on average.   Still using  oil based vit D, following med calendar better / rarely taking prednisone but helps when does  Lots of hoarseness and dry cough  Day >   >stop [place] and begin Spiriva     11/01/2015 NP  Acute OV  Presents for an acute office visit Patient complains of 2 weeks of increased cough, shortness of breath and wheezing. Feels that the high temperatures and humidity or making his breathing worse. Wears out with minimum activity   Denies any chest congestion/tightness, sinus pressure/drainage, fever, nausea or vomiting.  Has any fever or discolored mucus. Remains on Symbicort and Spiriva. Did start on prednisone over the last 1-2  days. Still has persistent cough and wheezing. Cough is keeping him up at night.. Patient and wife have multiple questions about his severity of his underlying COPD. He went over purse lip breathing. He would like to try pulmonary rehabilitation again. rec Zpack to have on hold if symtpoms worsen with discolored mucus.  Prednisone taper over next week.  Hydromet 1 tsp every 6hr, As needed  Cough , may make sleepy.  Refer to pulmonary rehab    12/25/2015  f/u ov/Wert re: GOLD III copd/ symb/spiriva - using med calendar well  Chief Complaint  Patient presents with  . Follow-up    Doing well and denies any co's today.   doe [facility] = can't walk a nl pace on a flat grade s sob but does fine slow and flat eg  WM shopping ok p using [facility] parking - rare saba need -has not needed pred recently main complaint is abd distention/ constipation  rec Try spiriva just one puff each am to see if bloating/constipation improve  and if so leave the dose just at one pff - if not ok try off completely after a few weeks of one pff but if off it makes no difference at all then just restart it at 2 each am as per med calendar  See calendar for specific medication instructions    01/17/2016 acute extended ov/Wert re: GOLD III/ aecopd  Chief Complaint  Patient presents with  . Acute Visit     Pt c/o cough x 5 days- prod with min clear sputum.  He had nosebleed last night.    severe cough onset 10/1 > min white mucus, worse at hs  Confused with names of meds and correlating them with med calendar though did bring it with him  Did not previously disclose use of tessalon but says now he was using it prn and worked fine until it ran out  Note note now on tenoretic, also not on med calendar  rec For dry cough as per med calendar > tessalon pearls 200 mg every 4-6 hours as needed and use the flutter valve For congested cough > mucinex dm up to 1200  Every 12 hours and use the flutter valve also  Increase the spiriva back to 2 puffs each am  See calendar for specific medication instructions   01/25/2016 acute extended ov/Wert re: copd gold III/ chronic dry cough  Chief Complaint  Patient presents with  . Acute Visit    Pt. c/o coughing on going  for couple of months, frequent coughing spills, some clear mucus, some sob, some wheezing, some chest pain after having coughing spells   improving ex tol at rehab s 02 Cough is worse around 3 am nightly x months but no excess mucus/ just dry hack ? Really using pepcid at hs as per med calendar  Has pred to take for flare of sob but not cough so has not tried it yet  >>pred taper , changed atenolol to bisoprolol    02/12/2016 Acute OV : COPD  He says he is feeling some better but has not gotten over his cough yet.  He started on tessalon last ov which is helping some.  Seen 2 weeks ago , given prednisone taper and change from atenolol to bisoprolol.  Chest xray last ov with copd changes , no acute process.  He denies chest pain , orthopnea, edema or fever.  Cough is mainly dry , sometimes worse at night .  Him and his wife  are worried why the cough does not go away .       Current Medications, Allergies, Complete Past Medical History, Past Surgical History, Family History, and Social History were reviewed in [employer] record.  ROS  The following are not active complaints unless bolded sore throat, dysphagia, dental problems, itching, sneezing,  nasal congestion or excess/ purulent secretions, ear ache,   fever, chills, sweats, unintended wt loss, classically pleuritic or exertional cp,  orthopnea pnd or leg swelling, presyncope, palpitations, abdominal pain, anorexia, nausea, vomiting, diarrhea  or change in bowel or bladder habits, change in stools or urine, dysuria,hematuria,  rash, arthralgias, visual complaints, headache, numbness, weakness or ataxia or problems with walking or coordination,  change in mood/affect or memory.            Objective:       Physical Exam  GEN: A/Ox3; pleasant , NAD, elderly , thin very anxious bm nad      07/07/2014        188  > 07/20/2014     182 08/03/2014 >>    11/01/2014  181 > 12/12/2014   177 >182 01/03/2015 >  04/04/2015  183 >04/28/2015 184 > 08/29/2015  181 > 12/25/2015  177> 01/25/2016   175    Vital signs reviewed -  Vitals:   02/12/16 1130  BP: 130/72  Pulse: 60  Temp: 97.7 F (36.5 C)  TempSrc: Oral  SpO2: 97%  Weight: 183 lb 3.2 oz (83.1 kg)  Height: 6' 1.5" (1.867 m)        HEENT:  [place]/AT,  EACs-clear, TMs-wnl, NOSE-clear, THROAT-clear, no lesions, no postnasal drip or exudate noted.  Top dentures -   NECK:  Supple w/ fair ROM; no JVD; normal carotid impulses w/o bruits; no thyromegaly or nodules palpated; no lymphadenopathy.    RESP     .  no accessory muscle use, no dullness to percussion- distant bs bilaterally with increased Texp  CARD:  RRR, no m/r/g  , no peripheral edema, pulses intact, no cyanosis or clubbing.  GI:   Min distended but Soft & nt; nml bowel sounds; no organomegaly or masses detected.   Musco: Warm bil, no deformities or joint swelling noted.   Neuro: alert, no focal deficits noted.    Skin: Warm, no lesions or rashes   CXR PA and Lateral:   01/25/2016 :    COPD changes , no acute      Tammy Parrett NP-C   [facility]  02/12/2016

## 2016-02-12 NOTE — Patient Instructions (Signed)
Continue on Spiriva . , rinse after use.  Continue on Symbicort 2 puffs Twice daily  , rinse after use.  Begin Delsym 2 tsp Twice daily  As needed  Cough .  Use Tessalon Three times a day  For cough .  Follow med calendar closely and bring to each visit.  Follow up Dr. Chase Caller in 6-8 weeks with PFT Set up for HRCT chest .  Please contact office for sooner follow up if symptoms do not improve or worsen or seek emergency care

## 2016-02-12 NOTE — Assessment & Plan Note (Signed)
Patient's medications were reviewed today and patient education was given. Computerized medication calendar was adjusted/completed  Change to Delysm/Tessalon .  Check CT chest -HRCT  Check PFT on return  Per pt request change to Dr. Chase Caller   Plan  Patient Instructions  Continue on Spiriva . , rinse after use.  Continue on Symbicort 2 puffs Twice daily  , rinse after use.  Begin Delsym 2 tsp Twice daily  As needed  Cough .  Use Tessalon Three times a day  For cough .  Follow med calendar closely and bring to each visit.  Follow up Dr. Chase Caller in 6-8 weeks with PFT Set up for HRCT chest .  Please contact office for sooner follow up if symptoms do not improve or worsen or seek emergency care

## 2016-02-13 ENCOUNTER — Encounter (HOSPITAL_COMMUNITY)
Admission: RE | Admit: 2016-02-13 | Discharge: 2016-02-13 | Disposition: A | Discharge status: Home / Self Care | Payer: Medicare Other | Source: Ambulatory Visit | Attending: Internal Medicine | Admitting: Internal Medicine

## 2016-02-13 VITALS — Wt 178.4 lb

## 2016-02-13 DIAGNOSIS — J449 Chronic obstructive pulmonary disease, unspecified: Secondary | ICD-10-CM

## 2016-02-13 DIAGNOSIS — J438 Other emphysema: Secondary | ICD-10-CM

## 2016-02-13 NOTE — Progress Notes (Signed)
Daily Session Note  Patient Details  Name: Jeremiah [name] MRN: [id number] Date of Birth: 1945/12/02 Referring Provider:   [name] Pulmonary Rehab Walk Test from 12/12/2015 in [place]  Referring Provider  Dr. Melvyn Novas      Encounter Date: 02/13/2016  Check In:     Session Check In - 02/13/16 1030      Check-In   Location [facility]   Staff Present Rosebud Poles, RN, BSN;Molly diVincenzo, MS, ACSM RCEP, Exercise Physiologist;[name] Ysidro Evert, Felipe Drone, RN, MHA;Ellery Meroney Rollene Rotunda, RN, BSN   Supervising physician immediately available to respond to emergencies [facility] immediately available   Physician(s) Dr. Alfredia Ferguson   Medication changes reported     No   Fall or balance concerns reported    No   Warm-up and Cool-down Performed as group-led instruction   Resistance Training Performed Yes   VAD Patient? No     Pain Assessment   Currently in Pain? No/denies   Multiple Pain Sites No      Capillary Blood Glucose: No results found for this or any previous visit (from the past 24 hour(s)).      Exercise Prescription Changes - 02/13/16 1214      Response to Exercise   Blood Pressure (Admit) 132/50   Blood Pressure (Exercise) 152/70   Blood Pressure (Exit) 124/70   Heart Rate (Admit) 70 bpm   Heart Rate (Exercise) 84 bpm   Heart Rate (Exit) 70 bpm   Oxygen Saturation (Admit) 95 %   Oxygen Saturation (Exercise) 93 %   Oxygen Saturation (Exit) 95 %   Rating of Perceived Exertion (Exercise) 13   Perceived Dyspnea (Exercise) 1   Duration Progress to 45 minutes of aerobic exercise without signs/symptoms of physical distress   Intensity THRR unchanged     Progression   Progression Continue to progress workloads to maintain intensity without signs/symptoms of physical distress.     Resistance Training   Training Prescription Yes   Weight BLUE BANDS   Reps 10-12  10 minutes of strength training     Interval  Training   Interval Training No     Treadmill   MPH 2.5   Grade 3   Minutes 17     NuStep   Level 5   Minutes 17   METs 4.2     Rower   Level 3   Minutes 17     Goals Met:  Independence with exercise equipment Improved SOB with ADL's Using PLB without cueing & demonstrates good technique Exercise tolerated well No report of cardiac concerns or symptoms Strength training completed today  Goals Unmet:  Not Applicable  Comments: Service time is from 1030 to 1215   Dr. Rush Farmer is Medical Director for Pulmonary Rehab at [facility].

## 2016-02-14 ENCOUNTER — Encounter: Payer: Non-veteran care | Admitting: Adult Health

## 2016-02-15 ENCOUNTER — Encounter (HOSPITAL_COMMUNITY)
Admission: RE | Admit: 2016-02-15 | Discharge: 2016-02-15 | Disposition: A | Discharge status: Home / Self Care | Payer: Medicare Other | Source: Ambulatory Visit | Attending: Internal Medicine | Admitting: Internal Medicine

## 2016-02-15 VITALS — Wt 178.8 lb

## 2016-02-15 DIAGNOSIS — J449 Chronic obstructive pulmonary disease, unspecified: Secondary | ICD-10-CM

## 2016-02-15 DIAGNOSIS — J438 Other emphysema: Secondary | ICD-10-CM

## 2016-02-15 NOTE — Progress Notes (Signed)
Daily Session Note  Patient Details  Name: Jeremiah [name] MRN: [id number] Date of Birth: 02-10-1946 Referring Provider:   [name] Pulmonary Rehab Walk Test from 12/12/2015 in [place]  Referring Provider  Dr. Melvyn Novas      Encounter Date: 02/15/2016  Check In:     Session Check In - 02/15/16 1035      Check-In   Location [facility]   Staff Present Su Hilt, MS, ACSM RCEP, Exercise Physiologist;Joan Leonia Reeves, RN, BSN;[name] Ysidro Evert, RN;Adriana Lina Rollene Rotunda, RN, BSN   Supervising physician immediately available to respond to emergencies [facility] immediately available   Physician(s) Dr. Lonny Prude   Medication changes reported     No   Fall or balance concerns reported    No   Warm-up and Cool-down Performed as group-led instruction   Resistance Training Performed Yes   VAD Patient? No     Pain Assessment   Currently in Pain? No/denies   Multiple Pain Sites No      Capillary Blood Glucose: No results found for this or any previous visit (from the past 24 hour(s)).      Exercise Prescription Changes - 02/15/16 1234      Exercise Review   Progression Yes     Response to Exercise   Blood Pressure (Admit) 124/80   Blood Pressure (Exercise) 144/66   Blood Pressure (Exit) 110/68   Heart Rate (Admit) 56 bpm   Heart Rate (Exercise) 84 bpm   Heart Rate (Exit) 65 bpm   Oxygen Saturation (Admit) 93 %   Oxygen Saturation (Exercise) 93 %   Oxygen Saturation (Exit) 65 %   Rating of Perceived Exertion (Exercise) 13   Perceived Dyspnea (Exercise) 2   Duration Progress to 45 minutes of aerobic exercise without signs/symptoms of physical distress   Intensity THRR unchanged     Progression   Progression Continue to progress workloads to maintain intensity without signs/symptoms of physical distress.     Resistance Training   Training Prescription Yes   Weight BLUE BANDS   Reps 10-12  10 minutes of strength training     Interval Training   Interval Training No     NuStep   Level 5   Minutes 17   METs 3.6     Rower   Level 4   Minutes 17     Goals Met:  Independence with exercise equipment Improved SOB with ADL's Using PLB without cueing & demonstrates good technique Changing diet to healthy choices, watching portion sizes Exercise tolerated well No report of cardiac concerns or symptoms Strength training completed today  Goals Unmet:  Not Applicable  Comments: Service time is from 1030 to 1230   Dr. Rush Farmer is Medical Director for Pulmonary Rehab at [facility].

## 2016-02-20 ENCOUNTER — Encounter (HOSPITAL_COMMUNITY)
Admission: RE | Admit: 2016-02-20 | Discharge: 2016-02-20 | Disposition: A | Discharge status: Home / Self Care | Payer: Medicare Other | Source: Ambulatory Visit | Attending: Internal Medicine | Admitting: Internal Medicine

## 2016-02-20 ENCOUNTER — Ambulatory Visit (INDEPENDENT_AMBULATORY_CARE_PROVIDER_SITE_OTHER)
Admission: RE | Admit: 2016-02-20 | Discharge: 2016-02-20 | Disposition: A | Discharge status: Home / Self Care | Payer: Medicare Other | Source: Ambulatory Visit | Attending: Adult Health | Admitting: Adult Health

## 2016-02-20 VITALS — Wt 179.7 lb

## 2016-02-20 DIAGNOSIS — R05 Cough: Secondary | ICD-10-CM

## 2016-02-20 DIAGNOSIS — J449 Chronic obstructive pulmonary disease, unspecified: Secondary | ICD-10-CM | POA: Diagnosis not present

## 2016-02-20 DIAGNOSIS — R059 Cough, unspecified: Secondary | ICD-10-CM

## 2016-02-20 NOTE — Progress Notes (Signed)
Pulmonary Individual Treatment Plan  Patient Details  Name: Jeremiah [name] MRN: [id number] Date of Birth: 07-27-1945 Referring Provider:   [name] Pulmonary Rehab Walk Test from 12/12/2015 in [place]  Referring Provider  Dr. Melvyn Novas      Initial Encounter Date:  Flowsheet Row Pulmonary Rehab Walk Test from 12/12/2015 in [facility]  Date  12/12/15  Referring Provider  Dr. Melvyn Novas      Visit Diagnosis: Chronic obstructive pulmonary disease, unspecified COPD type ([place])  Patient's Home Medications on Admission:   Current Outpatient Prescriptions:  .  albuterol (PROVENTIL HFA;VENTOLIN HFA) 108 (90 BASE) MCG/ACT inhaler, Inhale 2 puffs into the lungs every 4 (four) hours as needed for wheezing or shortness of breath (((PLAN B)))., Disp: 1 Inhaler, Rfl: 5 .  albuterol (PROVENTIL) (2.5 MG/3ML) 0.083% nebulizer solution, Take 3 mLs by nebulization every 4 (four) hours as needed for wheezing or shortness of breath (((PLAN C))). , Disp: , Rfl:  .  ALPRAZolam (XANAX) 0.5 MG tablet, TAKE 1 TABLET BY MOUTH TWICE A DAY AS NEEDED FOR ANXIETY, Disp: 60 tablet, Rfl: 1 .  B-D 3CC LUER-LOK SYR 21GX1-1/2 21G X 1-1/2" 3 ML MISC, USE NEEDLE/SYRINGE TO INJECT TESTOSTERONE INTO SKIN ONCE EVERY 2 WEEKS., Disp: , Rfl: 0 .  benzonatate (TESSALON) 200 MG capsule, Take 1 capsule (200 mg total) by mouth 3 (three) times daily as needed for cough., Disp: 60 capsule, Rfl: 11 .  bisoprolol-hydrochlorothiazide (ZIAC) 2.5-6.25 MG tablet, Take 1 tablet by mouth daily., Disp: 30 tablet, Rfl: 11 .  budesonide-formoterol (SYMBICORT) 160-4.5 MCG/ACT inhaler, Inhale 2 puffs into the lungs 2 (two) times daily., Disp: , Rfl:  .  buPROPion (WELLBUTRIN XL) 300 MG 24 hr tablet, Take 1 tablet by mouth daily., Disp: , Rfl:  .  CARAFATE 1 GM/10ML suspension, TAKE 10 MLS (1 GRAM TOTAL) BY MOUTH 4 TIMES A DAY, Disp: , Rfl: 1 .  cetirizine (ZYRTEC) 10 MG tablet, Take 10 mg by  mouth at bedtime as needed (drainage). , Disp: , Rfl:  .  Cholecalciferol (VITAMIN D3) 2000 units TABS, Take 1 tablet by mouth every morning., Disp: , Rfl:  .  dextromethorphan (DELSYM) 30 MG/5ML liquid, Take 2 tsp twice daily as needed with flutter valve still coughing, Disp: , Rfl:  .  dextromethorphan-guaiFENesin (MUCINEX DM) 30-600 MG per 12 hr tablet, Take 56ml every 4 hours as needed with flutter valve for cough and congestion, Disp: , Rfl:  .  esomeprazole (NEXIUM) 40 MG capsule, Take 40 mg by mouth daily before breakfast. , Disp: , Rfl: 2 .  famotidine (PEPCID) 20 MG tablet, Take 20 mg by mouth at bedtime., Disp: , Rfl:  .  fluticasone (FLONASE) 50 MCG/ACT nasal spray, Place 2 sprays into both nostrils 2 (two) times daily., Disp: , Rfl:  .  magnesium oxide (MAG-OX) 400 MG tablet, Take 400 mg by mouth 2 (two) times daily. For leg cramps, Disp: , Rfl:  .  naproxen sodium (ANAPROX) 220 MG tablet, Per bottle as needed for joint pain, Disp: , Rfl:  .  Polyvinyl Alcohol (LIQUIFILM TEARS OP), Reported on 09/13/2015, Disp: , Rfl:  .  predniSONE (DELTASONE) 10 MG tablet, Take  4 each am x 2 days,   2 each am x 2 days,  1 each am x 2 days and stop, Disp: 14 tablet, Rfl: 0 .  Probiotic Product (PHILLIPS COLON HEALTH) CAPS, Take 1 tablet by mouth daily., Disp: , Rfl:  .  Respiratory Therapy Supplies (FLUTTER) DEVI, Use as directed, Disp: 1 each, Rfl: 0 .  Simethicone (GAS-X PO), Take as directed for gas, Disp: , Rfl:  .  testosterone cypionate (DEPOTESTOSTERONE CYPIONATE) 200 MG/ML injection, INJECT 1 ML (200 MG TOTAL) INTO THE MUSCLE EVERY 14 (FOURTEEN) DAYS., Disp: , Rfl: 0 .  Tiotropium Bromide Monohydrate (SPIRIVA RESPIMAT) 2.5 MCG/ACT AERS, 2 each am, Disp: 1 Inhaler, Rfl: 11 .  traZODone (DESYREL) 50 MG tablet, Take 1 tablet by mouth at bedtime as needed., Disp: , Rfl:   Past Medical History: Past Medical History:  Diagnosis Date  . COPD (chronic obstructive pulmonary disease) ([place])   .  Depression   . Hypertension   . PTSD (post-traumatic stress disorder)   . Sleep apnea     Tobacco Use: History  Smoking Status  . Former Smoker  . Packs/day: 1.00  . Years: 30.00  . Types: Cigarettes  . Quit date: 04/16/1987  Smokeless Tobacco  . Never Used    Labs: Recent Review Flowsheet Data    Labs for ITP Cardiac and Pulmonary Rehab Latest Ref Rng & Units 04/24/2013 04/24/2013 04/25/2013   Trlycerides <150 mg/dL - - 29   PHART 7.350 - 7.450 7.101(LL) 7.175(LL) 7.440   PCO2ART 35.0 - 45.0 mmHg 85.1(HH) 61.4(HH) 30.2(L)   HCO3 20.0 - 24.0 mEq/L 27.4(H) 23.2 20.2   TCO2 0 - 100 mmol/L 30 25 21.1   ACIDBASEDEF 0.0 - 2.0 mmol/L 6.0(H) 7.0(H) 3.3(H)   O2SAT % 100.0 100.0 98.9      Capillary Blood Glucose: Lab Results  Component Value Date   GLUCAP 144 (H) 04/27/2013   GLUCAP 97 04/27/2013   GLUCAP 101 (H) 04/27/2013   GLUCAP 119 (H) 04/27/2013   GLUCAP 130 (H) 04/27/2013     ADL UCSD:   Pulmonary Function Assessment:     Pulmonary Function Assessment - 12/08/15 1035      Breath   Bilateral Breath Sounds Wheezes  mild wheezing heard in upper lobes bilat   Shortness of Breath Yes;Fear of Shortness of Breath;Limiting activity      Exercise Target Goals:    Exercise Program Goal: Individual exercise prescription set with THRR, safety & activity barriers. Participant demonstrates ability to understand and report RPE using BORG scale, to self-measure pulse accurately, and to acknowledge the importance of the exercise prescription.  Exercise Prescription Goal: Starting with aerobic activity 30 plus minutes a day, 3 days per week for initial exercise prescription. Provide home exercise prescription and guidelines that participant acknowledges understanding prior to discharge.  Activity Barriers & Risk Stratification:     Activity Barriers & Cardiac Risk Stratification - 12/08/15 1034      Activity Barriers & Cardiac Risk Stratification   Activity Barriers  Shortness of Breath;Deconditioning      6 Minute Walk:     6 Minute Walk    Row Name 12/14/15 0714         6 Minute Walk   Phase Initial     Distance 1275 feet     Walk Time 6 minutes     # of Rest Breaks 0     MPH 2.41     METS 2.84     RPE 11     Perceived Dyspnea  1     Symptoms No     Resting HR 59 bpm     Resting BP 124/80     Max Ex. HR 74 bpm     Max Ex. BP 134/80  Interval HR   Baseline HR 59     1 Minute HR 69     2 Minute HR 73     3 Minute HR 73     4 Minute HR 74     5 Minute HR 74     6 Minute HR 74     2 Minute Post HR 67     Interval Heart Rate? Yes       Interval Oxygen   Interval Oxygen? Yes     Baseline Oxygen Saturation % 95 %     Baseline Liters of Oxygen 0 L     1 Minute Oxygen Saturation % 94 %     1 Minute Liters of Oxygen 0 L     2 Minute Oxygen Saturation % 94 %     2 Minute Liters of Oxygen 0 L     3 Minute Oxygen Saturation % 94 %     3 Minute Liters of Oxygen 0 L     4 Minute Oxygen Saturation % 94 %     4 Minute Liters of Oxygen 0 L     5 Minute Oxygen Saturation % 94 %     5 Minute Liters of Oxygen 0 L     6 Minute Oxygen Saturation % 94 %     6 Minute Liters of Oxygen 0 L     2 Minute Post Oxygen Saturation % 95 %     2 Minute Post Liters of Oxygen 0 L        Initial Exercise Prescription:     Initial Exercise Prescription - 12/14/15 0700      Date of Initial Exercise RX and Referring Provider   Date 12/12/15   Referring Provider Dr. Melvyn Novas     Treadmill   MPH 2   Grade 0   Minutes 17     NuStep   Level 3   Minutes 17   METs 1.5     Rower   Level 2   Minutes 17     Prescription Details   Frequency (times per week) 2   Duration Progress to 45 minutes of aerobic exercise without signs/symptoms of physical distress     Intensity   THRR 40-80% of Max Heartrate 68-122   Ratings of Perceived Exertion 11-13   Perceived Dyspnea 0-4     Progression   Progression Continue progressive overload as per  policy without signs/symptoms or physical distress.     Resistance Training   Training Prescription Yes   Weight blue bands   Reps 10-12      Perform Capillary Blood Glucose checks as needed.  Exercise Prescription Changes:     Exercise Prescription Changes    Row Name 12/26/15 1200 12/28/15 1200 01/02/16 1200 01/04/16 1300 01/09/16 1200     Exercise Review   Progression  - Yes  -  - Yes     Response to Exercise   Blood Pressure (Admit) 100/68 100/64 136/74 140/72 120/64   Blood Pressure (Exercise) 122/74 132/88 132/70 124/72 122/66   Blood Pressure (Exit) 100/68 118/60 114/60 110/74 100/60   Heart Rate (Admit) 54 bpm 49 bpm 70 bpm 62 bpm 65 bpm   Heart Rate (Exercise) 67 bpm 59 bpm 80 bpm 71 bpm 70 bpm   Heart Rate (Exit) 59 bpm 55 bpm 68 bpm 63 bpm 71 bpm   Oxygen Saturation (Admit) 95 % 97 % 98 % 95 % 96 %   Oxygen Saturation (  Exercise) 92 % 97 % 91 % 95 % 76 %   Oxygen Saturation (Exit) 94 % 94 % 96 % 96 % 71 %   Rating of Perceived Exertion (Exercise) 13 13 15 15 17    Perceived Dyspnea (Exercise) 1 2 3 2 2    Duration Progress to 45 minutes of aerobic exercise without signs/symptoms of physical distress Progress to 45 minutes of aerobic exercise without signs/symptoms of physical distress Progress to 45 minutes of aerobic exercise without signs/symptoms of physical distress Progress to 45 minutes of aerobic exercise without signs/symptoms of physical distress Progress to 45 minutes of aerobic exercise without signs/symptoms of physical distress   Intensity THRR unchanged THRR unchanged THRR unchanged THRR unchanged THRR unchanged     Progression   Progression Continue to progress workloads to maintain intensity without signs/symptoms of physical distress. Continue to progress workloads to maintain intensity without signs/symptoms of physical distress. Continue to progress workloads to maintain intensity without signs/symptoms of physical distress. Continue to progress  workloads to maintain intensity without signs/symptoms of physical distress. Continue to progress workloads to maintain intensity without signs/symptoms of physical distress.     Resistance Training   Training Prescription Yes Yes Yes Yes Yes   Weight blue bandas bands bands bands bands   Reps 10-12  10 minutes of strenght training 10-12  10 minutes of strenght training 10-12  10 minutes of strenght training 10-12  10 minutes of strength training 10-12  10 minutes of strength training     Interval Training   Interval Training No No No No No     Treadmill   MPH 2  - 2  - 2.2   Grade 0  - 0  - 2   Minutes 17  - 17  - 17     NuStep   Level 3 4 4 4 4    Minutes 17 17 17 17 17    METs  - 2.6 2.4 3.4 2.7     Rower   Level 2 2 2 2 3    Minutes 17 17 17 17 17      Home Exercise Plan   Plans to continue exercise at  -  -  -  - Home   Frequency  -  -  -  - Add 3 additional days to program exercise sessions.   Row Name 01/11/16 1200 01/16/16 1604 01/18/16 1200 01/23/16 1200 01/30/16 1200     Response to Exercise   Blood Pressure (Admit) 100/60 120/66 124/70 102/60 140/80   Blood Pressure (Exercise) 118/64 128/64 110/74 116/80 140/84   Blood Pressure (Exit) 102/64 118/68 98/60 106/62 122/82   Heart Rate (Admit) 53 bpm 73 bpm 58 bpm 50 bpm 64 bpm   Heart Rate (Exercise) 66 bpm 86 bpm 67 bpm 74 bpm 86 bpm   Heart Rate (Exit) 61 bpm 77 bpm 56 bpm 63 bpm 69 bpm   Oxygen Saturation (Admit) 94 % 97 % 97 % 95 % 98 %   Oxygen Saturation (Exercise) 95 % 93 % 98 % 94 % 97 %   Oxygen Saturation (Exit) 95 % 97 % 98 % 95 % 97 %   Rating of Perceived Exertion (Exercise) 12 13 12 13 11    Perceived Dyspnea (Exercise) 2 2 1 2 2    Duration Progress to 45 minutes of aerobic exercise without signs/symptoms of physical distress Progress to 45 minutes of aerobic exercise without signs/symptoms of physical distress Progress to 45 minutes of aerobic exercise without signs/symptoms of physical distress  Progress  to 45 minutes of aerobic exercise without signs/symptoms of physical distress Progress to 45 minutes of aerobic exercise without signs/symptoms of physical distress   Intensity THRR unchanged THRR unchanged THRR unchanged THRR unchanged THRR unchanged     Progression   Progression Continue to progress workloads to maintain intensity without signs/symptoms of physical distress. Continue to progress workloads to maintain intensity without signs/symptoms of physical distress. Continue to progress workloads to maintain intensity without signs/symptoms of physical distress. Continue to progress workloads to maintain intensity without signs/symptoms of physical distress. Continue to progress workloads to maintain intensity without signs/symptoms of physical distress.     Resistance Training   Training Prescription Yes Yes Yes Yes Yes   Weight bands bands green bands green bands green bands   Reps 10-12  10 minutes of strength training 10-12  10 minutes of strength training 10-12  10 minutes of strength training 10-12  10 minutes of strength training 10-12  10 minutes of strength training     Interval Training   Interval Training No No No No No     Treadmill   MPH 2.2 2.2  - 2.2 2.2   Grade 2 2  - 2 2   Minutes 17 17  - 17 17     NuStep   Level  - 5 5 5 5    Minutes  - 17 17 17 17    METs  - 3.6 4.1 3.4 3.6     Rower   Level 3 3 3 3 3    Minutes 17 17 17 17 17    Row Name 02/01/16 1200 02/06/16 1200 02/08/16 1200 02/13/16 1214 02/15/16 1234     Exercise Review   Progression  - Yes  -  - Yes     Response to Exercise   Blood Pressure (Admit) 104/50 128/74 122/70 132/50 124/80   Blood Pressure (Exercise) 122/70 140/70 110/70 152/70 144/66   Blood Pressure (Exit) 116/56 114/62 124/80 124/70 110/68   Heart Rate (Admit) 65 bpm 62 bpm 75 bpm 70 bpm 56 bpm   Heart Rate (Exercise) 82 bpm 83 bpm 71 bpm 84 bpm 84 bpm   Heart Rate (Exit) 64 bpm 61 bpm 65 bpm 70 bpm 65 bpm   Oxygen Saturation  (Admit) 96 % 99 % 94 % 95 % 93 %   Oxygen Saturation (Exercise) 92 % 93 % 94 % 93 % 93 %   Oxygen Saturation (Exit) 97 % 97 % 97 % 95 % 65 %   Rating of Perceived Exertion (Exercise) 11 12 13 13 13    Perceived Dyspnea (Exercise) 1 2 2 1 2    Duration Progress to 45 minutes of aerobic exercise without signs/symptoms of physical distress Progress to 45 minutes of aerobic exercise without signs/symptoms of physical distress Progress to 45 minutes of aerobic exercise without signs/symptoms of physical distress Progress to 45 minutes of aerobic exercise without signs/symptoms of physical distress Progress to 45 minutes of aerobic exercise without signs/symptoms of physical distress   Intensity THRR unchanged THRR unchanged THRR unchanged THRR unchanged THRR unchanged     Progression   Progression Continue to progress workloads to maintain intensity without signs/symptoms of physical distress. Continue to progress workloads to maintain intensity without signs/symptoms of physical distress. Continue to progress workloads to maintain intensity without signs/symptoms of physical distress. Continue to progress workloads to maintain intensity without signs/symptoms of physical distress. Continue to progress workloads to maintain intensity without signs/symptoms of physical distress.     Resistance  Training   Training Prescription Yes Yes Yes Yes Yes   Weight green bands BLUE BANDS BLUE BANDS BLUE BANDS BLUE BANDS   Reps 10-12  10 minutes of strength training 10-12  10 minutes of strength training 10-12  10 minutes of strength training 10-12  10 minutes of strength training 10-12  10 minutes of strength training     Interval Training   Interval Training No No No No No     Treadmill   MPH 2.2 2.5 2.5 2.5  -   Grade 2 3 3 3   -   Minutes 17 17 17 17   -     NuStep   Level 5 5  - 5 5   Minutes 17 17  - 17 17   METs 3.8 2.6  - 4.2 3.6     Rower   Level  - 3 3 3 4    Minutes  - 17 17 17 17        Exercise Comments:     Exercise Comments    Row Name 01/09/16 1702 01/22/16 0934 02/19/16 1641       Exercise Comments Home exercise completed Patient is progressing well. Only on 8th session-- will cont. to monitor.  Patient states he has only been exercising once a week at home. Encouraged to stick to the Home Exercise Prescription. Patient stated understanding. Will cont. to monitor and progress here at rehab.        Discharge Exercise Prescription (Final Exercise Prescription Changes):     Exercise Prescription Changes - 02/15/16 1234      Exercise Review   Progression Yes     Response to Exercise   Blood Pressure (Admit) 124/80   Blood Pressure (Exercise) 144/66   Blood Pressure (Exit) 110/68   Heart Rate (Admit) 56 bpm   Heart Rate (Exercise) 84 bpm   Heart Rate (Exit) 65 bpm   Oxygen Saturation (Admit) 93 %   Oxygen Saturation (Exercise) 93 %   Oxygen Saturation (Exit) 65 %   Rating of Perceived Exertion (Exercise) 13   Perceived Dyspnea (Exercise) 2   Duration Progress to 45 minutes of aerobic exercise without signs/symptoms of physical distress   Intensity THRR unchanged     Progression   Progression Continue to progress workloads to maintain intensity without signs/symptoms of physical distress.     Resistance Training   Training Prescription Yes   Weight BLUE BANDS   Reps 10-12  10 minutes of strength training     Interval Training   Interval Training No     NuStep   Level 5   Minutes 17   METs 3.6     Rower   Level 4   Minutes 17       Nutrition:  Target Goals: Understanding of nutrition guidelines, daily intake of sodium 1500mg , cholesterol 200mg , calories 30% from fat and 7% or less from saturated fats, daily to have 5 or more servings of fruits and vegetables.  Biometrics:     Pre Biometrics - 12/08/15 1036      Pre Biometrics   Grip Strength 25 kg       Nutrition Therapy Plan and Nutrition Goals:     Nutrition Therapy  & Goals - 12/28/15 1513      Nutrition Therapy   Diet General, Healthful      Personal Nutrition Goals   Personal Goal #1 Maintain wt around 178 lb while in Pulmonary Rehab     Intervention Plan  Intervention Prescribe, educate and counsel regarding individualized specific dietary modifications aiming towards targeted core components such as weight, hypertension, lipid management, diabetes, heart failure and other comorbidities.   Expected Outcomes Short Term Goal: Understand basic principles of dietary content, such as calories, fat, sodium, cholesterol and nutrients.;Long Term Goal: Adherence to prescribed nutrition plan.      Nutrition Discharge: Rate Your Plate Scores:     Nutrition Assessments - 01/18/16 1153      Rate Your Plate Scores   Pre Score 57      Psychosocial: Target Goals: Acknowledge presence or absence of depression, maximize coping skills, provide positive support system. Participant is able to verbalize types and ability to use techniques and skills needed for reducing stress and depression.  Initial Review & Psychosocial Screening:     Initial Psych Review & Screening - 12/08/15 1045      Initial Review   Current issues with Current Stress Concerns   Comments family relationships     Family Dynamics   Good Support System? Yes     Barriers   Psychosocial barriers to participate in program There are no identifiable barriers or psychosocial needs.     Screening Interventions   Interventions Encouraged to exercise      Quality of Life Scores:   PHQ-9: Recent Review Flowsheet Data    Depression screen [facility] 2/9 12/08/2015 12/28/2014 02/14/2014   Decreased Interest 0 0 0   Down, Depressed, Hopeless 0 0 0   PHQ - 2 Score 0 0 0      Psychosocial Evaluation and Intervention:     Psychosocial Evaluation - 12/08/15 1046      Psychosocial Evaluation & Interventions   Interventions Encouraged to exercise with the program and follow exercise  prescription      Psychosocial Re-Evaluation:     Psychosocial Re-Evaluation    [place] Name 01/23/16 0746 02/20/16 [id number]           Psychosocial Re-Evaluation   Interventions Encouraged to attend Pulmonary Rehabilitation for the exercise Encouraged to attend Pulmonary Rehabilitation for the exercise      Comments there are no psychosocial barriers to participation identified within the past 30 days there are no psychosocial barriers to participation identified within the past 30 days        Education: Education Goals: Education classes will be provided on a weekly basis, covering required topics. Participant will state understanding/return demonstration of topics presented.  Learning Barriers/Preferences:     Learning Barriers/Preferences - 12/08/15 1035      Learning Barriers/Preferences   Learning Barriers None   Learning Preferences Computer/Internet;Group Instruction;Individual Instruction;Verbal Instruction;Written Material      Education Topics: Risk Factor Reduction:  -Group instruction that is supported by a PowerPoint presentation. Instructor discusses the definition of a risk factor, different risk factors for pulmonary disease, and how the heart and lungs work together.     Nutrition for Pulmonary Patient:  -Group instruction provided by PowerPoint slides, verbal discussion, and written materials to support subject matter. The instructor gives an explanation and review of healthy diet recommendations, which includes a discussion on weight management, recommendations for fruit and vegetable consumption, as well as protein, fluid, caffeine, fiber, sodium, sugar, and alcohol. Tips for eating when patients are short of breath are discussed.   Pursed Lip Breathing:  -Group instruction that is supported by demonstration and informational handouts. Instructor discusses the benefits of pursed lip and diaphragmatic breathing and detailed demonstration on how to preform both.  Oxygen Safety:  -Group instruction provided by PowerPoint, verbal discussion, and written material to support subject matter. There is an overview of "What is Oxygen" and "Why do we need it".  Instructor also reviews how to create a safe environment for oxygen use, the importance of using oxygen as prescribed, and the risks of noncompliance. There is a brief discussion on traveling with oxygen and resources the patient may utilize. Flowsheet Row PULMONARY REHAB CHRONIC OBSTRUCTIVE PULMONARY DISEASE from 02/15/2016 in [place]  Date  02/15/16  Educator  RN  Instruction Review Code  2- meets goals/outcomes      Oxygen Equipment:  -Group instruction provided by [facility] Staff utilizing handouts, written materials, and equipment demonstrations.   Signs and Symptoms:  -Group instruction provided by written material and verbal discussion to support subject matter. Warning signs and symptoms of infection, stroke, and heart attack are reviewed and when to call the physician/911 reinforced. Tips for preventing the spread of infection discussed. Flowsheet Row PULMONARY REHAB CHRONIC OBSTRUCTIVE PULMONARY DISEASE from 02/15/2016 in [place]  Date  01/11/16  Educator  RN  Instruction Review Code  2- meets goals/outcomes      Advanced Directives:  -Group instruction provided by verbal instruction and written material to support subject matter. Instructor reviews Advanced Directive laws and proper instruction for filling out document.   Pulmonary Video:  -Group video education that reviews the importance of medication and oxygen compliance, exercise, good nutrition, pulmonary hygiene, and pursed lip and diaphragmatic breathing for the pulmonary patient. Flowsheet Row PULMONARY REHAB CHRONIC OBSTRUCTIVE PULMONARY DISEASE from 02/15/2016 in [place]  Date  02/01/16  Instruction Review Code  2-  meets goals/outcomes      Exercise for the Pulmonary Patient:  -Group instruction that is supported by a PowerPoint presentation. Instructor discusses benefits of exercise, core components of exercise, frequency, duration, and intensity of an exercise routine, importance of utilizing pulse oximetry during exercise, safety while exercising, and options of places to exercise outside of rehab.   Flowsheet Row PULMONARY REHAB CHRONIC OBSTRUCTIVE PULMONARY DISEASE from 02/15/2016 in [place]  Date  01/04/16  Educator  EP  Instruction Review Code  2- meets goals/outcomes      Pulmonary Medications:  -Verbally interactive group education provided by instructor with focus on inhaled medications and proper administration.   Anatomy and Physiology of the Respiratory System and Intimacy:  -Group instruction provided by PowerPoint, verbal discussion, and written material to support subject matter. Instructor reviews respiratory cycle and anatomical components of the respiratory system and their functions. Instructor also reviews differences in obstructive and restrictive respiratory diseases with examples of each. Intimacy, Sex, and Sexuality differences are reviewed with a discussion on how relationships can change when diagnosed with pulmonary disease. Common sexual concerns are reviewed.   Knowledge Questionnaire Score:   Core Components/Risk Factors/Patient Goals at Admission:     Personal Goals and Risk Factors at Admission - 12/08/15 1036      Core Components/Risk Factors/Patient Goals on Admission    Weight Management Weight Loss;Yes   Admit Weight 185 lb (83.9 kg)   Goal Weight: Long Term 170 lb (77.1 kg)   Expected Outcomes Weight Loss: Understanding of general recommendations for a balanced deficit meal plan, which promotes 1-2 lb weight loss per week and includes a negative energy balance of [phone] kcal/d;Understanding recommendations for meals to  include 15-35% energy as protein, 25-35%  energy from fat, 35-60% energy from carbohydrates, less than 200mg  of dietary cholesterol, 20-35 gm of total fiber daily;Understanding of distribution of calorie intake throughout the day with the consumption of 4-5 meals/snacks;Short Term: Continue to assess and modify interventions until short term weight is achieved;Long Term: Adherence to nutrition and physical activity/exercise program aimed toward attainment of established weight goal   Increase Strength and Stamina Yes   Intervention Provide advice, education, support and counseling about physical activity/exercise needs.;Develop an individualized exercise prescription for aerobic and resistive training based on initial evaluation findings, risk stratification, comorbidities and participant's personal goals.   Expected Outcomes Achievement of increased cardiorespiratory fitness and enhanced flexibility, muscular endurance and strength shown through measurements of functional capacity and personal statement of participant.   Improve shortness of breath with ADL's Yes   Intervention Provide education, individualized exercise plan and daily activity instruction to help decrease symptoms of SOB with activities of daily living.   Expected Outcomes Short Term: Achieves a reduction of symptoms when performing activities of daily living.   Develop more efficient breathing techniques such as purse lipped breathing and diaphragmatic breathing; and practicing self-pacing with activity Yes   Intervention Provide education, demonstration and support about specific breathing techniuqes utilized for more efficient breathing. Include techniques such as pursed lipped breathing, diaphragmatic breathing and self-pacing activity.   Expected Outcomes Short Term: Participant will be able to demonstrate and use breathing techniques as needed throughout daily activities.   Increase knowledge of respiratory medications and ability to  use respiratory devices properly  Yes   Intervention Provide education and demonstration as needed of appropriate use of medications, inhalers, and oxygen therapy.   Expected Outcomes Short Term: Achieves understanding of medications use. Understands that oxygen is a medication prescribed by physician. Demonstrates appropriate use of inhaler and oxygen therapy.      Core Components/Risk Factors/Patient Goals Review:      Goals and Risk Factor Review    Row Name 01/18/16 1155 01/23/16 0742 02/20/16 0652         Core Components/Risk Factors/Patient Goals Review   Personal Goals Review Weight Management/Obesity Increase knowledge of respiratory medications and ability to use respiratory devices properly.;Sedentary;Improve shortness of breath with ADL's;Increase Strength and Stamina;Develop more efficient breathing techniques such as purse lipped breathing and diaphragmatic breathing and practicing self-pacing with activity. Increase knowledge of respiratory medications and ability to use respiratory devices properly.;Sedentary;Improve shortness of breath with ADL's;Increase Strength and Stamina;Develop more efficient breathing techniques such as purse lipped breathing and diaphragmatic breathing and practicing self-pacing with activity.     Review see 01/18/16 RD note see "comments" section on ITP see "comments" section on ITP     Expected Outcomes Pt to maintain his wt around 178 lb while in Pulmonary Rehab see "Admission" expected outcomes see "Admission" expected outcomes        Core Components/Risk Factors/Patient Goals at Discharge (Final Review):      Goals and Risk Factor Review - 02/20/16 0652      Core Components/Risk Factors/Patient Goals Review   Personal Goals Review Increase knowledge of respiratory medications and ability to use respiratory devices properly.;Sedentary;Improve shortness of breath with ADL's;Increase Strength and Stamina;Develop more efficient breathing techniques  such as purse lipped breathing and diaphragmatic breathing and practicing self-pacing with activity.   Review see "comments" section on ITP   Expected Outcomes see "Admission" expected outcomes      ITP Comments:   Comments: ITP REVIEW Pt is making expected progress toward pulmonary rehab goals  after completing 15 sessions. Recommend continued exercise, life style modification, education, and utilization of breathing techniques to increase stamina and strength and decrease shortness of breath with exertion.

## 2016-02-20 NOTE — Progress Notes (Signed)
Daily Session Note  Patient Details  Name: Elisha Cooksey MRN: [id number] Date of Birth: 12-Mar-1946 Referring Provider:   [name] Pulmonary Rehab Walk Test from 12/12/2015 in [place]  Referring Provider  Dr. Melvyn Novas      Encounter Date: 02/20/2016  Check In:     Session Check In - 02/20/16 1036      Check-In   Location [facility]   Staff Present Su Hilt, MS, ACSM RCEP, Exercise Physiologist;Joan Leonia Reeves, RN, BSN;[name] Hughes, RN;Portia Rollene Rotunda, RN, BSN   Supervising physician immediately available to respond to emergencies [facility] immediately available   Physician(s) Dr. Lonny Prude   Medication changes reported     No   Fall or balance concerns reported    No   Warm-up and Cool-down Performed as group-led instruction   Resistance Training Performed Yes   VAD Patient? No     Pain Assessment   Currently in Pain? No/denies   Multiple Pain Sites No      Capillary Blood Glucose: No results found for this or any previous visit (from the past 24 hour(s)).      Exercise Prescription Changes - 02/20/16 1200      Response to Exercise   Blood Pressure (Admit) 140/72   Blood Pressure (Exercise) 130/62   Blood Pressure (Exit) 128/78   Heart Rate (Admit) 63 bpm   Heart Rate (Exercise) 94 bpm   Heart Rate (Exit) 67 bpm   Oxygen Saturation (Admit) 96 %   Oxygen Saturation (Exercise) 92 %   Oxygen Saturation (Exit) 95 %   Rating of Perceived Exertion (Exercise) 12   Perceived Dyspnea (Exercise) 2   Duration Progress to 45 minutes of aerobic exercise without signs/symptoms of physical distress   Intensity THRR unchanged     Progression   Progression Continue to progress workloads to maintain intensity without signs/symptoms of physical distress.     Resistance Training   Training Prescription Yes   Weight BLUE BANDS   Reps 10-12  10 minutes of strength training     Interval Training   Interval Training No      Treadmill   MPH 2.5   Grade 3   Minutes 17     NuStep   Level 5   Minutes 17   METs 3     Rower   Level 4   Minutes 17     Goals Met:  Exercise tolerated well No report of cardiac concerns or symptoms Strength training completed today  Goals Unmet:  Not Applicable  Comments: Service time is from 10:30am to 12:05pm    Dr. Rush Farmer is Medical Director for Pulmonary Rehab at [facility].

## 2016-02-21 DIAGNOSIS — E349 Endocrine disorder, unspecified: Secondary | ICD-10-CM | POA: Diagnosis not present

## 2016-02-21 DIAGNOSIS — I10 Essential (primary) hypertension: Secondary | ICD-10-CM | POA: Diagnosis not present

## 2016-02-21 DIAGNOSIS — J449 Chronic obstructive pulmonary disease, unspecified: Secondary | ICD-10-CM | POA: Diagnosis not present

## 2016-02-21 DIAGNOSIS — R7302 Impaired glucose tolerance (oral): Secondary | ICD-10-CM | POA: Diagnosis not present

## 2016-02-21 DIAGNOSIS — J440 Chronic obstructive pulmonary disease with acute lower respiratory infection: Secondary | ICD-10-CM | POA: Diagnosis not present

## 2016-02-21 DIAGNOSIS — E559 Vitamin D deficiency, unspecified: Secondary | ICD-10-CM | POA: Diagnosis not present

## 2016-02-21 DIAGNOSIS — R3915 Urgency of urination: Secondary | ICD-10-CM | POA: Diagnosis not present

## 2016-02-21 DIAGNOSIS — G4700 Insomnia, unspecified: Secondary | ICD-10-CM | POA: Diagnosis not present

## 2016-02-22 ENCOUNTER — Encounter (HOSPITAL_COMMUNITY)
Admission: RE | Admit: 2016-02-22 | Discharge: 2016-02-22 | Disposition: A | Discharge status: Home / Self Care | Payer: Medicare Other | Source: Ambulatory Visit | Attending: Internal Medicine | Admitting: Internal Medicine

## 2016-02-22 VITALS — Wt 179.0 lb

## 2016-02-22 DIAGNOSIS — J449 Chronic obstructive pulmonary disease, unspecified: Secondary | ICD-10-CM | POA: Diagnosis not present

## 2016-02-22 DIAGNOSIS — J438 Other emphysema: Secondary | ICD-10-CM

## 2016-02-22 NOTE — Progress Notes (Signed)
Daily Session Note  Patient Details  Name: Jeremiah [name] MRN: [id number] Date of Birth: Feb 14, 1946 Referring Provider:   [name] Pulmonary Rehab Walk Test from 12/12/2015 in [place]  Referring Provider  Dr. Melvyn Novas      Encounter Date: 02/22/2016  Check In:     Session Check In - 02/22/16 1030      Check-In   [facility]   Staff Present Rosebud Poles, RN, BSN;Molly diVincenzo, MS, ACSM RCEP, Exercise Physiologist;[name] Ysidro Evert, Felipe Drone, RN, MHA;Carey Lafon Rollene Rotunda, RN, BSN   Supervising physician immediately available to respond to emergencies [facility] immediately available   Physician(s) [name]   Medication changes reported     No   Fall or balance concerns reported    No   Warm-up and Cool-down Performed as group-led instruction   Resistance Training Performed Yes   VAD Patient? No     Pain Assessment   Currently in Pain? No/denies   Multiple Pain Sites No      Capillary Blood Glucose: No results found for this or any previous visit (from the past 24 hour(s)).      Exercise Prescription Changes - 02/22/16 1301      Response to Exercise   Blood Pressure (Admit) 140/70   Blood Pressure (Exercise) 150/80   Blood Pressure (Exit) 134/70   Heart Rate (Admit) 54 bpm   Heart Rate (Exercise) 73 bpm   Heart Rate (Exit) 59 bpm   Oxygen Saturation (Admit) 96 %   Oxygen Saturation (Exercise) 93 %   Oxygen Saturation (Exit) 93 %   Rating of Perceived Exertion (Exercise) 12   Perceived Dyspnea (Exercise) 2   Duration Progress to 45 minutes of aerobic exercise without signs/symptoms of physical distress   Intensity THRR unchanged     Progression   Progression Continue to progress workloads to maintain intensity without signs/symptoms of physical distress.     Resistance Training   Training Prescription Yes   Weight BLUE BANDS   Reps 10-12  10 minutes of strength training     Interval  Training   Interval Training No     Treadmill   MPH 2.5   Grade 3   Minutes 17     Rower   Level 4   Minutes 17     Goals Met:  Independence with exercise equipment Improved SOB with ADL's Using PLB without cueing & demonstrates good technique Exercise tolerated well No report of cardiac concerns or symptoms Strength training completed today  Goals Unmet:  Not Applicable  Comments: Service time is from 1030 to 1230   Dr. Rush Farmer is Medical Director for Pulmonary Rehab at [facility].

## 2016-02-22 NOTE — Progress Notes (Signed)
[name] 70 y.o. male  78 day Psychosocial Note  Patient psychosocial assessment reveals no barriers to participation in Pulmonary Rehab. Will has had 90% participation since admission. He admits to having a better outlook on life this time in rehab as compared to his last admission. Patient does continue to exhibit positive coping skills to deal with any psychosocial concerns that may arrise. Offered emotional support and reassurance. Patient does feel he is making significant progress toward Pulmonary Rehab goals. Patient reports his health and activity level has improved in the past 30 days as evidenced by patient's report of increased ability to exercise. He is walking 30 min daily on the days he is not in pulmonary rehab.Patient states family/friends have noticed changes in his activity or mood. His wife has also noticed a decrease in his shortness of breath with activity. Patient reports feeling positive about current and projected progression in Pulmonary Rehab. After reviewing the patient's treatment plan, the patient is making progress toward Pulmonary Rehab goals. Patient's rate of progress toward rehab goals is good. Plan of action to help patient continue to work towards rehab goals include increasing workloads as tolerated on equipment in order to increase stamina and strength. Will continue to monitor and evaluate progress toward psychosocial goal(s).  Goal(s) in progress: Improve anxiety associated with increased shortness of breath  Help patient work toward returning to meaningful activities that improve patient's QOL and are attainable with patient's lung disease

## 2016-02-26 NOTE — Addendum Note (Signed)
Addended by: Doroteo Glassman D on: 02/26/2016 10:58 AM   Modules accepted: Orders

## 2016-02-27 ENCOUNTER — Encounter (HOSPITAL_COMMUNITY)
Admission: RE | Admit: 2016-02-27 | Discharge: 2016-02-27 | Disposition: A | Discharge status: Home / Self Care | Payer: Medicare Other | Source: Ambulatory Visit | Attending: Internal Medicine | Admitting: Internal Medicine

## 2016-02-27 VITALS — Wt 179.0 lb

## 2016-02-27 DIAGNOSIS — J438 Other emphysema: Secondary | ICD-10-CM

## 2016-02-27 DIAGNOSIS — J449 Chronic obstructive pulmonary disease, unspecified: Secondary | ICD-10-CM | POA: Diagnosis not present

## 2016-02-27 NOTE — Progress Notes (Signed)
Daily Session Note  Patient Details  Name: Jeremiah [name] MRN: [id number] Date of Birth: 17-Feb-1946 Referring Provider:   [name] Pulmonary Rehab Walk Test from 12/12/2015 in [place]  Referring Provider  Dr. Melvyn Novas      Encounter Date: 02/27/2016  Check In:     Session Check In - 02/27/16 1212      Check-In   [facility]   Staff Present Rosebud Poles, RN, Marga Melnick, RN, BSN;Arzella Rehmann, MS, ACSM RCEP, Exercise Physiologist;[name] Ysidro Evert, RN   Supervising physician immediately available to respond to emergencies [facility] immediately available   Physician(s) [name]   Medication changes reported     No   Fall or balance concerns reported    No   Warm-up and Cool-down Performed as group-led instruction   Resistance Training Performed Yes   VAD Patient? No     Pain Assessment   Currently in Pain? No/denies   Multiple Pain Sites No      Capillary Blood Glucose: No results found for this or any previous visit (from the past 24 hour(s)).      Exercise Prescription Changes - 02/27/16 1200      Response to Exercise   Blood Pressure (Admit) 140/84   Blood Pressure (Exercise) 154/90   Blood Pressure (Exit) 134/80   Heart Rate (Admit) 70 bpm   Heart Rate (Exercise) 73 bpm   Heart Rate (Exit) 64 bpm   Oxygen Saturation (Admit) 96 %   Oxygen Saturation (Exercise) 91 %   Oxygen Saturation (Exit) 91 %   Rating of Perceived Exertion (Exercise) 13   Perceived Dyspnea (Exercise) 2   Duration Progress to 45 minutes of aerobic exercise without signs/symptoms of physical distress   Intensity THRR unchanged     Progression   Progression Continue to progress workloads to maintain intensity without signs/symptoms of physical distress.     Resistance Training   Training Prescription Yes   Weight BLUE BANDS   Reps 10-12  10 minutes of strength training     Interval Training   Interval Training No      Treadmill   MPH 2.5   Grade 3   Minutes 17     NuStep   Level 5   Minutes 17   METs 3.2     Rower   Level 4   Minutes 17     Goals Met:  Exercise tolerated well No report of cardiac concerns or symptoms Strength training completed today  Goals Unmet:  Not Applicable  Comments: Service time is from 10:30am to 12:05pm    Dr. Rush Farmer is Medical Director for Pulmonary Rehab at [facility].

## 2016-02-27 NOTE — Progress Notes (Signed)
Jeremiah [name] presents to pulmonary rehab for his bi-weekly exercise session. I have completed his thirty day face to face review and determined that Jeremiah [name] is on track for meeting their pulmonary rehab goals. There are no barriers identified that will prevent them from continuing their exercise in pulmonary rehab as prescribed.   Rush Farmer, M.D. [facility]. Pager: [phone]. After hours pager: [phone].

## 2016-02-29 ENCOUNTER — Encounter (HOSPITAL_COMMUNITY)
Admission: RE | Admit: 2016-02-29 | Discharge: 2016-02-29 | Disposition: A | Discharge status: Home / Self Care | Payer: Medicare Other | Source: Ambulatory Visit | Attending: Internal Medicine | Admitting: Internal Medicine

## 2016-02-29 DIAGNOSIS — J449 Chronic obstructive pulmonary disease, unspecified: Secondary | ICD-10-CM | POA: Diagnosis not present

## 2016-02-29 DIAGNOSIS — J438 Other emphysema: Secondary | ICD-10-CM

## 2016-02-29 NOTE — Progress Notes (Signed)
Daily Session Note  Patient Details  Name: Jeremiah [name] MRN: [id number] Date of Birth: 14-Jan-1946 Referring Provider:   [name] Pulmonary Rehab Walk Test from 12/12/2015 in [place]  Referring Provider  Dr. Melvyn Novas      Encounter Date: 02/29/2016  Check In:     Session Check In - 02/29/16 1045      Check-In   [facility]   Staff Present Rosebud Poles, RN, BSN;Galileo Colello, MS, ACSM RCEP, Exercise Physiologist;Portia Rollene Rotunda, RN, Roque Cash, RN   Supervising physician immediately available to respond to emergencies [facility] immediately available   Physician(s) Dr. Grandville Silos   Medication changes reported     No   Fall or balance concerns reported    No   Warm-up and Cool-down Performed as group-led instruction   Resistance Training Performed Yes   VAD Patient? No     Pain Assessment   Currently in Pain? No/denies   Multiple Pain Sites No      Capillary Blood Glucose: No results found for this or any previous visit (from the past 24 hour(s)).      Exercise Prescription Changes - 02/29/16 1200      Response to Exercise   Blood Pressure (Admit) 150/80   Blood Pressure (Exercise) 160/94   Blood Pressure (Exit) 140/86   Heart Rate (Admit) 60 bpm   Heart Rate (Exercise) 72 bpm   Heart Rate (Exit) 59 bpm   Oxygen Saturation (Admit) 94 %   Oxygen Saturation (Exercise) 93 %   Oxygen Saturation (Exit) 92 %   Rating of Perceived Exertion (Exercise) 12   Perceived Dyspnea (Exercise) 2   Duration Progress to 45 minutes of aerobic exercise without signs/symptoms of physical distress   Intensity THRR unchanged     Progression   Progression Continue to progress workloads to maintain intensity without signs/symptoms of physical distress.     Resistance Training   Training Prescription Yes   Weight BLUE BANDS   Reps 10-12  10 minutes of strength training     Interval Training   Interval Training  No     Treadmill   MPH 2.5   Grade 3   Minutes 17     NuStep   Level 5   Minutes 17   METs 3.6     Goals Met:  Personal goals reviewed No report of cardiac concerns or symptoms Strength training completed today  Goals Unmet:  Not Applicable  Comments: Service time is from 10:30am to 12:20pm    Dr. Rush Farmer is Medical Director for Pulmonary Rehab at [facility].

## 2016-03-05 ENCOUNTER — Encounter (HOSPITAL_COMMUNITY): Payer: Medicare Other

## 2016-03-05 ENCOUNTER — Telehealth (HOSPITAL_COMMUNITY): Payer: Self-pay | Admitting: Family

## 2016-03-07 ENCOUNTER — Encounter (HOSPITAL_COMMUNITY): Payer: Medicare Other

## 2016-03-07 DIAGNOSIS — R069 Unspecified abnormalities of breathing: Secondary | ICD-10-CM | POA: Diagnosis not present

## 2016-03-10 ENCOUNTER — Other Ambulatory Visit: Payer: Self-pay | Admitting: Internal Medicine

## 2016-03-11 DIAGNOSIS — M792 Neuralgia and neuritis, unspecified: Secondary | ICD-10-CM | POA: Diagnosis not present

## 2016-03-12 ENCOUNTER — Encounter (HOSPITAL_COMMUNITY)
Admission: RE | Admit: 2016-03-12 | Discharge: 2016-03-12 | Disposition: A | Discharge status: Home / Self Care | Payer: Medicare Other | Source: Ambulatory Visit | Attending: Internal Medicine | Admitting: Internal Medicine

## 2016-03-12 VITALS — Wt 179.0 lb

## 2016-03-12 DIAGNOSIS — J449 Chronic obstructive pulmonary disease, unspecified: Secondary | ICD-10-CM

## 2016-03-12 DIAGNOSIS — J438 Other emphysema: Secondary | ICD-10-CM

## 2016-03-12 NOTE — Progress Notes (Signed)
Daily Session Note  Patient Details  Name: Jeremiah [name] MRN: [id number] Date of Birth: [birth year] Referring Provider:   [name] Pulmonary Rehab Walk Test from 12/12/2015 in [place]  Referring Provider  Dr. Melvyn Novas      Encounter Date: 03/12/2016  Check In:     Session Check In - 03/12/16 1021      Check-In   Location [facility]   Staff Present Rosebud Poles, RN, BSN;Yvette Roark, MS, ACSM RCEP, Exercise Physiologist;[name] Ysidro Evert, RN;Portia Rollene Rotunda, RN, BSN   Supervising physician immediately available to respond to emergencies [facility] immediately available   Physician(s) Dr. Cathlean Sauer   Medication changes reported     No   Fall or balance concerns reported    No   Warm-up and Cool-down Performed as group-led instruction   Resistance Training Performed Yes   VAD Patient? No     Pain Assessment   Currently in Pain? No/denies   Multiple Pain Sites No      Capillary Blood Glucose: No results found for this or any previous visit (from the past 24 hour(s)).      Exercise Prescription Changes - 03/12/16 1200      Response to Exercise   Blood Pressure (Admit) 140/72   Blood Pressure (Exercise) 132/74   Blood Pressure (Exit) 122/68   Heart Rate (Admit) 57 bpm   Heart Rate (Exercise) 75 bpm   Heart Rate (Exit) 63 bpm   Oxygen Saturation (Admit) 93 %   Oxygen Saturation (Exercise) 90 %   Oxygen Saturation (Exit) 90 %   Rating of Perceived Exertion (Exercise) 12   Perceived Dyspnea (Exercise) 2   Duration Progress to 45 minutes of aerobic exercise without signs/symptoms of physical distress   Intensity THRR unchanged     Progression   Progression Continue to progress workloads to maintain intensity without signs/symptoms of physical distress.     Resistance Training   Training Prescription Yes   Weight BLUE BANDS   Reps 10-12  10 minutes of strength training     Interval Training   Interval Training No      Treadmill   MPH 2.5   Grade 3   Minutes 17     NuStep   Level 5   Minutes 17   METs 3.4     Rower   Level 4   Minutes 17     Goals Met:  Exercise tolerated well No report of cardiac concerns or symptoms Strength training completed today  Goals Unmet:  Not Applicable  Comments: Service time is from 10:30am to 12:00pm    Dr. Rush Farmer is Medical Director for Pulmonary Rehab at [facility].

## 2016-03-14 ENCOUNTER — Encounter (HOSPITAL_COMMUNITY)
Admission: RE | Admit: 2016-03-14 | Discharge: 2016-03-14 | Disposition: A | Discharge status: Home / Self Care | Payer: Medicare Other | Source: Ambulatory Visit | Attending: Internal Medicine | Admitting: Internal Medicine

## 2016-03-14 VITALS — Wt 174.4 lb

## 2016-03-14 DIAGNOSIS — J438 Other emphysema: Secondary | ICD-10-CM

## 2016-03-14 DIAGNOSIS — J449 Chronic obstructive pulmonary disease, unspecified: Secondary | ICD-10-CM | POA: Diagnosis not present

## 2016-03-14 NOTE — Progress Notes (Signed)
Daily Session Note  Patient Details  Name: Jeremiah [name] MRN: [id number] Date of Birth: [birth year] Referring Provider:   [name] Pulmonary Rehab Walk Test from 12/12/2015 in [place]  Referring Provider  Dr. Melvyn Novas      Encounter Date: 03/14/2016  Check In:     Session Check In - 03/14/16 1023      Check-In   Location [facility]   Staff Present Su Hilt, MS, ACSM RCEP, Exercise Physiologist;Joan Leonia Reeves, RN, BSN;Bridgitte Felicetti Ysidro Evert, RN;Portia Rollene Rotunda, RN, BSN   Supervising physician immediately available to respond to emergencies [facility] immediately available   Physician(s) Dr. Ree Kida   Medication changes reported     No   Fall or balance concerns reported    No   Warm-up and Cool-down Performed as group-led instruction   Resistance Training Performed Yes   VAD Patient? No     Pain Assessment   Currently in Pain? No/denies   Multiple Pain Sites No      Capillary Blood Glucose: No results found for this or any previous visit (from the past 24 hour(s)).      Exercise Prescription Changes - 03/14/16 1200      Response to Exercise   Blood Pressure (Admit) 138/80   Blood Pressure (Exercise) 140/80   Blood Pressure (Exit) 126/66   Heart Rate (Admit) 71 bpm   Heart Rate (Exercise) 85 bpm   Heart Rate (Exit) 70 bpm   Oxygen Saturation (Admit) 95 %   Oxygen Saturation (Exercise) 94 %   Oxygen Saturation (Exit) 96 %   Rating of Perceived Exertion (Exercise) 12   Perceived Dyspnea (Exercise) 2   Duration Progress to 45 minutes of aerobic exercise without signs/symptoms of physical distress   Intensity THRR unchanged     Progression   Progression Continue to progress workloads to maintain intensity without signs/symptoms of physical distress.     Resistance Training   Training Prescription Yes   Weight blue bands   Reps 10-12  10 minutes of strength training     Interval Training   Interval Training  No     NuStep   Level 5   Minutes 17   METs 4.7     Rower   Level 4   Minutes 17     Goals Met:  Exercise tolerated well No report of cardiac concerns or symptoms Strength training completed today  Goals Unmet:  Not Applicable  Comments: Service time is from 1030 to 1150    Dr. Rush Farmer is Medical Director for Pulmonary Rehab at [facility].

## 2016-03-19 ENCOUNTER — Encounter (HOSPITAL_COMMUNITY)
Admission: RE | Admit: 2016-03-19 | Discharge: 2016-03-19 | Disposition: A | Discharge status: Home / Self Care | Payer: Medicare Other | Source: Ambulatory Visit | Attending: Internal Medicine | Admitting: Internal Medicine

## 2016-03-19 VITALS — Wt 179.2 lb

## 2016-03-19 DIAGNOSIS — J449 Chronic obstructive pulmonary disease, unspecified: Secondary | ICD-10-CM | POA: Diagnosis not present

## 2016-03-19 NOTE — Progress Notes (Signed)
Pulmonary Individual Treatment Plan  Patient Details  Name: Jeremiah [name] MRN: [id number] Date of Birth: 1946-03-19 Referring Provider:   [name] Pulmonary Rehab Walk Test from 12/12/2015 in [place]  Referring Provider  Dr. Melvyn Novas      Initial Encounter Date:  Flowsheet Row Pulmonary Rehab Walk Test from 12/12/2015 in [place]  Date  12/12/15  Referring Provider  Dr. Melvyn Novas      Visit Diagnosis: Chronic obstructive pulmonary disease, unspecified COPD type ([place])  Patient's Home Medications on Admission:   Current Outpatient Prescriptions:  .  albuterol (PROVENTIL HFA;VENTOLIN HFA) 108 (90 BASE) MCG/ACT inhaler, Inhale 2 puffs into the lungs every 4 (four) hours as needed for wheezing or shortness of breath (((PLAN B)))., Disp: 1 Inhaler, Rfl: 5 .  albuterol (PROVENTIL) (2.5 MG/3ML) 0.083% nebulizer solution, Take 3 mLs by nebulization every 4 (four) hours as needed for wheezing or shortness of breath (((PLAN C))). , Disp: , Rfl:  .  ALPRAZolam (XANAX) 0.5 MG tablet, TAKE 1 TABLET BY MOUTH TWICE A DAY AS NEEDED FOR ANXIETY, Disp: 60 tablet, Rfl: 1 .  B-D 3CC LUER-LOK SYR 21GX1-1/2 21G X 1-1/2" 3 ML MISC, USE NEEDLE/SYRINGE TO INJECT TESTOSTERONE INTO SKIN ONCE EVERY 2 WEEKS., Disp: , Rfl: 0 .  benzonatate (TESSALON) 200 MG capsule, Take 1 capsule (200 mg total) by mouth 3 (three) times daily as needed for cough., Disp: 60 capsule, Rfl: 11 .  bisoprolol-hydrochlorothiazide (ZIAC) 5-6.25 MG tablet, Take 1 tablet by mouth daily., Disp: , Rfl:  .  budesonide-formoterol (SYMBICORT) 160-4.5 MCG/ACT inhaler, Inhale 2 puffs into the lungs 2 (two) times daily., Disp: , Rfl:  .  buPROPion (WELLBUTRIN XL) 150 MG 24 hr tablet, Take 150 mg by mouth daily., Disp: , Rfl:  .  cetirizine (ZYRTEC) 10 MG tablet, Take 10 mg by mouth at bedtime. , Disp: , Rfl:  .  Cholecalciferol (VITAMIN D3) 2000 units TABS, Take 1 tablet by mouth every  morning., Disp: , Rfl:  .  dextromethorphan (DELSYM) 30 MG/5ML liquid, Take 2 tsp twice daily as needed with flutter valve still coughing, Disp: , Rfl:  .  dextromethorphan-guaiFENesin (MUCINEX DM) 30-600 MG per 12 hr tablet, Take 8ml every 4 hours as needed with flutter valve for cough and congestion, Disp: , Rfl:  .  esomeprazole (NEXIUM) 40 MG capsule, Take 40 mg by mouth daily before breakfast. , Disp: , Rfl: 2 .  famotidine (PEPCID) 20 MG tablet, Take 20 mg by mouth at bedtime., Disp: , Rfl:  .  fluticasone (FLONASE) 50 MCG/ACT nasal spray, Place 2 sprays into both nostrils 2 (two) times daily., Disp: , Rfl:  .  magnesium oxide (MAG-OX) 400 MG tablet, Take 400 mg by mouth 2 (two) times daily. For leg cramps, Disp: , Rfl:  .  Multiple Vitamin (CALCIUM COMPLEX PO), Take by mouth. Take 3 tablets in am, Disp: , Rfl:  .  naproxen sodium (ANAPROX) 220 MG tablet, Per bottle as needed for joint pain, Disp: , Rfl:  .  Polyvinyl Alcohol (LIQUIFILM TEARS OP), Reported on 09/13/2015, Disp: , Rfl:  .  Probiotic Product (PHILLIPS COLON HEALTH) CAPS, Take 1 tablet by mouth daily., Disp: , Rfl:  .  Respiratory Therapy Supplies (FLUTTER) DEVI, Use as directed, Disp: 1 each, Rfl: 0 .  testosterone cypionate (DEPOTESTOSTERONE CYPIONATE) 200 MG/ML injection, INJECT 1 ML (200 MG TOTAL) INTO THE MUSCLE EVERY 14 (FOURTEEN) DAYS., Disp: , Rfl: 0 .  Tiotropium Bromide  Monohydrate (SPIRIVA RESPIMAT) 2.5 MCG/ACT AERS, 2 each am, Disp: 1 Inhaler, Rfl: 11 .  traZODone (DESYREL) 50 MG tablet, Take 1 tablet by mouth at bedtime as needed., Disp: , Rfl:   Past Medical History: Past Medical History:  Diagnosis Date  . COPD (chronic obstructive pulmonary disease) ([place])   . Depression   . Hypertension   . PTSD (post-traumatic stress disorder)   . Sleep apnea     Tobacco Use: History  Smoking Status  . Former Smoker  . Packs/day: 1.00  . Years: 30.00  . Types: Cigarettes  . Quit date: 04/16/1987  Smokeless Tobacco   . Never Used    Labs: Recent Review Flowsheet Data    Labs for ITP Cardiac and Pulmonary Rehab Latest Ref Rng & Units 04/24/2013 04/24/2013 04/25/2013   Trlycerides <150 mg/dL - - 29   PHART 7.350 - 7.450 7.101(LL) 7.175(LL) 7.440   PCO2ART 35.0 - 45.0 mmHg 85.1(HH) 61.4(HH) 30.2(L)   HCO3 20.0 - 24.0 mEq/L 27.4(H) 23.2 20.2   TCO2 0 - 100 mmol/L 30 25 21.1   ACIDBASEDEF 0.0 - 2.0 mmol/L 6.0(H) 7.0(H) 3.3(H)   O2SAT % 100.0 100.0 98.9      Capillary Blood Glucose: Lab Results  Component Value Date   GLUCAP 144 (H) 04/27/2013   GLUCAP 97 04/27/2013   GLUCAP 101 (H) 04/27/2013   GLUCAP 119 (H) 04/27/2013   GLUCAP 130 (H) 04/27/2013     ADL UCSD:   Pulmonary Function Assessment:     Pulmonary Function Assessment - 12/08/15 1035      Breath   Bilateral Breath Sounds Wheezes  mild wheezing heard in upper lobes bilat   Shortness of Breath Yes;Fear of Shortness of Breath;Limiting activity      Exercise Target Goals:    Exercise Program Goal: Individual exercise prescription set with THRR, safety & activity barriers. Participant demonstrates ability to understand and report RPE using BORG scale, to self-measure pulse accurately, and to acknowledge the importance of the exercise prescription.  Exercise Prescription Goal: Starting with aerobic activity 30 plus minutes a day, 3 days per week for initial exercise prescription. Provide home exercise prescription and guidelines that participant acknowledges understanding prior to discharge.  Activity Barriers & Risk Stratification:     Activity Barriers & Cardiac Risk Stratification - 12/08/15 1034      Activity Barriers & Cardiac Risk Stratification   Activity Barriers Shortness of Breath;Deconditioning      6 Minute Walk:     6 Minute Walk    Row Name 12/14/15 0714         6 Minute Walk   Phase Initial     Distance 1275 feet     Walk Time 6 minutes     # of Rest Breaks 0     MPH 2.41     METS 2.84      RPE 11     Perceived Dyspnea  1     Symptoms No     Resting HR 59 bpm     Resting BP 124/80     Max Ex. HR 74 bpm     Max Ex. BP 134/80       Interval HR   Baseline HR 59     1 Minute HR 69     2 Minute HR 73     3 Minute HR 73     4 Minute HR 74     5 Minute HR 74     6 Minute  HR 74     2 Minute Post HR 67     Interval Heart Rate? Yes       Interval Oxygen   Interval Oxygen? Yes     Baseline Oxygen Saturation % 95 %     Baseline Liters of Oxygen 0 L     1 Minute Oxygen Saturation % 94 %     1 Minute Liters of Oxygen 0 L     2 Minute Oxygen Saturation % 94 %     2 Minute Liters of Oxygen 0 L     3 Minute Oxygen Saturation % 94 %     3 Minute Liters of Oxygen 0 L     4 Minute Oxygen Saturation % 94 %     4 Minute Liters of Oxygen 0 L     5 Minute Oxygen Saturation % 94 %     5 Minute Liters of Oxygen 0 L     6 Minute Oxygen Saturation % 94 %     6 Minute Liters of Oxygen 0 L     2 Minute Post Oxygen Saturation % 95 %     2 Minute Post Liters of Oxygen 0 L        Initial Exercise Prescription:     Initial Exercise Prescription - 12/14/15 0700      Date of Initial Exercise RX and Referring Provider   Date 12/12/15   Referring Provider Dr. Melvyn Novas     Treadmill   MPH 2   Grade 0   Minutes 17     NuStep   Level 3   Minutes 17   METs 1.5     Rower   Level 2   Minutes 17     Prescription Details   Frequency (times per week) 2   Duration Progress to 45 minutes of aerobic exercise without signs/symptoms of physical distress     Intensity   THRR 40-80% of Max Heartrate 68-122   Ratings of Perceived Exertion 11-13   Perceived Dyspnea 0-4     Progression   Progression Continue progressive overload as per policy without signs/symptoms or physical distress.     Resistance Training   Training Prescription Yes   Weight blue bands   Reps 10-12      Perform Capillary Blood Glucose checks as needed.  Exercise Prescription Changes:     Exercise  Prescription Changes    Row Name 12/26/15 1200 12/28/15 1200 01/02/16 1200 01/04/16 1300 01/09/16 1200     Exercise Review   Progression  - Yes  -  - Yes     Response to Exercise   Blood Pressure (Admit) 100/68 100/64 136/74 140/72 120/64   Blood Pressure (Exercise) 122/74 132/88 132/70 124/72 122/66   Blood Pressure (Exit) 100/68 118/60 114/60 110/74 100/60   Heart Rate (Admit) 54 bpm 49 bpm 70 bpm 62 bpm 65 bpm   Heart Rate (Exercise) 67 bpm 59 bpm 80 bpm 71 bpm 70 bpm   Heart Rate (Exit) 59 bpm 55 bpm 68 bpm 63 bpm 71 bpm   Oxygen Saturation (Admit) 95 % 97 % 98 % 95 % 96 %   Oxygen Saturation (Exercise) 92 % 97 % 91 % 95 % 76 %   Oxygen Saturation (Exit) 94 % 94 % 96 % 96 % 71 %   Rating of Perceived Exertion (Exercise) 13 13 15 15 17    Perceived Dyspnea (Exercise) 1 2 3 2 2    Duration Progress to  45 minutes of aerobic exercise without signs/symptoms of physical distress Progress to 45 minutes of aerobic exercise without signs/symptoms of physical distress Progress to 45 minutes of aerobic exercise without signs/symptoms of physical distress Progress to 45 minutes of aerobic exercise without signs/symptoms of physical distress Progress to 45 minutes of aerobic exercise without signs/symptoms of physical distress   Intensity THRR unchanged THRR unchanged THRR unchanged THRR unchanged THRR unchanged     Progression   Progression Continue to progress workloads to maintain intensity without signs/symptoms of physical distress. Continue to progress workloads to maintain intensity without signs/symptoms of physical distress. Continue to progress workloads to maintain intensity without signs/symptoms of physical distress. Continue to progress workloads to maintain intensity without signs/symptoms of physical distress. Continue to progress workloads to maintain intensity without signs/symptoms of physical distress.     Resistance Training   Training Prescription Yes Yes Yes Yes Yes   Weight  blue bandas bands bands bands bands   Reps 10-12  10 minutes of strenght training 10-12  10 minutes of strenght training 10-12  10 minutes of strenght training 10-12  10 minutes of strength training 10-12  10 minutes of strength training     Interval Training   Interval Training No No No No No     Treadmill   MPH 2  - 2  - 2.2   Grade 0  - 0  - 2   Minutes 17  - 17  - 17     NuStep   Level 3 4 4 4 4    Minutes 17 17 17 17 17    METs  - 2.6 2.4 3.4 2.7     Rower   Level 2 2 2 2 3    Minutes 17 17 17 17 17      Home Exercise Plan   Plans to continue exercise at  -  -  -  - Home   Frequency  -  -  -  - Add 3 additional days to program exercise sessions.   [place] Name 01/11/16 1200 01/16/16 1604 01/18/16 1200 01/23/16 1200 01/30/16 1200     Response to Exercise   Blood Pressure (Admit) 100/60 120/66 124/70 102/60 140/80   Blood Pressure (Exercise) 118/64 128/64 110/74 116/80 140/84   Blood Pressure (Exit) 102/64 118/68 98/60 106/62 122/82   Heart Rate (Admit) 53 bpm 73 bpm 58 bpm 50 bpm 64 bpm   Heart Rate (Exercise) 66 bpm 86 bpm 67 bpm 74 bpm 86 bpm   Heart Rate (Exit) 61 bpm 77 bpm 56 bpm 63 bpm 69 bpm   Oxygen Saturation (Admit) 94 % 97 % 97 % 95 % 98 %   Oxygen Saturation (Exercise) 95 % 93 % 98 % 94 % 97 %   Oxygen Saturation (Exit) 95 % 97 % 98 % 95 % 97 %   Rating of Perceived Exertion (Exercise) 12 13 12 13 11    Perceived Dyspnea (Exercise) 2 2 1 2 2    Duration Progress to 45 minutes of aerobic exercise without signs/symptoms of physical distress Progress to 45 minutes of aerobic exercise without signs/symptoms of physical distress Progress to 45 minutes of aerobic exercise without signs/symptoms of physical distress Progress to 45 minutes of aerobic exercise without signs/symptoms of physical distress Progress to 45 minutes of aerobic exercise without signs/symptoms of physical distress   Intensity THRR unchanged THRR unchanged THRR unchanged THRR unchanged THRR unchanged      Progression   Progression Continue to progress workloads to maintain intensity  without signs/symptoms of physical distress. Continue to progress workloads to maintain intensity without signs/symptoms of physical distress. Continue to progress workloads to maintain intensity without signs/symptoms of physical distress. Continue to progress workloads to maintain intensity without signs/symptoms of physical distress. Continue to progress workloads to maintain intensity without signs/symptoms of physical distress.     Resistance Training   Training Prescription Yes Yes Yes Yes Yes   Weight bands bands green bands green bands green bands   Reps 10-12  10 minutes of strength training 10-12  10 minutes of strength training 10-12  10 minutes of strength training 10-12  10 minutes of strength training 10-12  10 minutes of strength training     Interval Training   Interval Training No No No No No     Treadmill   MPH 2.2 2.2  - 2.2 2.2   Grade 2 2  - 2 2   Minutes 17 17  - 17 17     NuStep   Level  - 5 5 5 5    Minutes  - 17 17 17 17    METs  - 3.6 4.1 3.4 3.6     Rower   Level 3 3 3 3 3    Minutes 17 17 17 17 17    Row Name 02/01/16 1200 02/06/16 1200 02/08/16 1200 02/13/16 1214 02/15/16 1234     Exercise Review   Progression  - Yes  -  - Yes     Response to Exercise   Blood Pressure (Admit) 104/50 128/74 122/70 132/50 124/80   Blood Pressure (Exercise) 122/70 140/70 110/70 152/70 144/66   Blood Pressure (Exit) 116/56 114/62 124/80 124/70 110/68   Heart Rate (Admit) 65 bpm 62 bpm 75 bpm 70 bpm 56 bpm   Heart Rate (Exercise) 82 bpm 83 bpm 71 bpm 84 bpm 84 bpm   Heart Rate (Exit) 64 bpm 61 bpm 65 bpm 70 bpm 65 bpm   Oxygen Saturation (Admit) 96 % 99 % 94 % 95 % 93 %   Oxygen Saturation (Exercise) 92 % 93 % 94 % 93 % 93 %   Oxygen Saturation (Exit) 97 % 97 % 97 % 95 % 65 %   Rating of Perceived Exertion (Exercise) 11 12 13 13 13    Perceived Dyspnea (Exercise) 1 2 2 1 2    Duration  Progress to 45 minutes of aerobic exercise without signs/symptoms of physical distress Progress to 45 minutes of aerobic exercise without signs/symptoms of physical distress Progress to 45 minutes of aerobic exercise without signs/symptoms of physical distress Progress to 45 minutes of aerobic exercise without signs/symptoms of physical distress Progress to 45 minutes of aerobic exercise without signs/symptoms of physical distress   Intensity THRR unchanged THRR unchanged THRR unchanged THRR unchanged THRR unchanged     Progression   Progression Continue to progress workloads to maintain intensity without signs/symptoms of physical distress. Continue to progress workloads to maintain intensity without signs/symptoms of physical distress. Continue to progress workloads to maintain intensity without signs/symptoms of physical distress. Continue to progress workloads to maintain intensity without signs/symptoms of physical distress. Continue to progress workloads to maintain intensity without signs/symptoms of physical distress.     Resistance Training   Training Prescription Yes Yes Yes Yes Yes   Weight green bands BLUE BANDS BLUE BANDS BLUE BANDS BLUE BANDS   Reps 10-12  10 minutes of strength training 10-12  10 minutes of strength training 10-12  10 minutes of strength training 10-12  10 minutes of strength  training 10-12  10 minutes of strength training     Interval Training   Interval Training No No No No No     Treadmill   MPH 2.2 2.5 2.5 2.5  -   Grade 2 3 3 3   -   Minutes 17 17 17 17   -     NuStep   Level 5 5  - 5 5   Minutes 17 17  - 17 17   METs 3.8 2.6  - 4.2 3.6     Rower   Level  - 3 3 3 4    Minutes  - 17 17 17 17    Row Name 02/20/16 1200 02/22/16 1301 02/27/16 1200 02/29/16 1200 03/12/16 1200     Response to Exercise   Blood Pressure (Admit) 140/72 140/70 140/84 150/80 140/72   Blood Pressure (Exercise) 130/62 150/80 154/90 160/94 132/74   Blood Pressure (Exit) 128/78  134/70 134/80 140/86 122/68   Heart Rate (Admit) 63 bpm 54 bpm 70 bpm 60 bpm 57 bpm   Heart Rate (Exercise) 94 bpm 73 bpm 73 bpm 72 bpm 75 bpm   Heart Rate (Exit) 67 bpm 59 bpm 64 bpm 59 bpm 63 bpm   Oxygen Saturation (Admit) 96 % 96 % 96 % 94 % 93 %   Oxygen Saturation (Exercise) 92 % 93 % 91 % 93 % 90 %   Oxygen Saturation (Exit) 95 % 93 % 91 % 92 % 90 %   Rating of Perceived Exertion (Exercise) 12 12 13 12 12    Perceived Dyspnea (Exercise) 2 2 2 2 2    Duration Progress to 45 minutes of aerobic exercise without signs/symptoms of physical distress Progress to 45 minutes of aerobic exercise without signs/symptoms of physical distress Progress to 45 minutes of aerobic exercise without signs/symptoms of physical distress Progress to 45 minutes of aerobic exercise without signs/symptoms of physical distress Progress to 45 minutes of aerobic exercise without signs/symptoms of physical distress   Intensity THRR unchanged THRR unchanged THRR unchanged THRR unchanged THRR unchanged     Progression   Progression Continue to progress workloads to maintain intensity without signs/symptoms of physical distress. Continue to progress workloads to maintain intensity without signs/symptoms of physical distress. Continue to progress workloads to maintain intensity without signs/symptoms of physical distress. Continue to progress workloads to maintain intensity without signs/symptoms of physical distress. Continue to progress workloads to maintain intensity without signs/symptoms of physical distress.     Resistance Training   Training Prescription Yes Yes Yes Yes Yes   Weight BLUE BANDS BLUE BANDS BLUE BANDS BLUE BANDS BLUE BANDS   Reps 10-12  10 minutes of strength training 10-12  10 minutes of strength training 10-12  10 minutes of strength training 10-12  10 minutes of strength training 10-12  10 minutes of strength training     Interval Training   Interval Training No No No No No     Treadmill   MPH  2.5 2.5 2.5 2.5 2.5   Grade 3 3 3 3 3    Minutes 17 17 17 17 17      NuStep   Level 5  - 5 5 5    Minutes 17  - 17 17 17    METs 3  - 3.2 3.6 3.4     Rower   Level 4 4 4   - 4   Minutes 17 17 17   - 17   Row Name 03/14/16 1200  Response to Exercise   Blood Pressure (Admit) 138/80       Blood Pressure (Exercise) 140/80       Blood Pressure (Exit) 126/66       Heart Rate (Admit) 71 bpm       Heart Rate (Exercise) 85 bpm       Heart Rate (Exit) 70 bpm       Oxygen Saturation (Admit) 95 %       Oxygen Saturation (Exercise) 94 %       Oxygen Saturation (Exit) 96 %       Rating of Perceived Exertion (Exercise) 12       Perceived Dyspnea (Exercise) 2       Duration Progress to 45 minutes of aerobic exercise without signs/symptoms of physical distress       Intensity THRR unchanged         Progression   Progression Continue to progress workloads to maintain intensity without signs/symptoms of physical distress.         Resistance Training   Training Prescription Yes       Weight blue bands       Reps 10-12  10 minutes of strength training         Interval Training   Interval Training No         NuStep   Level 5       Minutes 17       METs 4.7         Rower   Level 4       Minutes 17          Exercise Comments:     Exercise Comments    Row Name 01/09/16 1702 01/22/16 0934 02/19/16 1641 03/18/16 1027     Exercise Comments Home exercise completed Patient is progressing well. Only on 8th session-- will cont. to monitor.  Patient states he has only been exercising once a week at home. Encouraged to stick to the Home Exercise Prescription. Patient stated understanding. Will cont. to monitor and progress here at rehab. Patient is slowly progressing in program. I feel that he could put in more effort. Will cont. to motivate and encourage.        Discharge Exercise Prescription (Final Exercise Prescription Changes):     Exercise Prescription Changes - 03/14/16  1200      Response to Exercise   Blood Pressure (Admit) 138/80   Blood Pressure (Exercise) 140/80   Blood Pressure (Exit) 126/66   Heart Rate (Admit) 71 bpm   Heart Rate (Exercise) 85 bpm   Heart Rate (Exit) 70 bpm   Oxygen Saturation (Admit) 95 %   Oxygen Saturation (Exercise) 94 %   Oxygen Saturation (Exit) 96 %   Rating of Perceived Exertion (Exercise) 12   Perceived Dyspnea (Exercise) 2   Duration Progress to 45 minutes of aerobic exercise without signs/symptoms of physical distress   Intensity THRR unchanged     Progression   Progression Continue to progress workloads to maintain intensity without signs/symptoms of physical distress.     Resistance Training   Training Prescription Yes   Weight blue bands   Reps 10-12  10 minutes of strength training     Interval Training   Interval Training No     NuStep   Level 5   Minutes 17   METs 4.7     Rower   Level 4   Minutes 17       Nutrition:  Target  Goals: Understanding of nutrition guidelines, daily intake of sodium 1500mg , cholesterol 200mg , calories 30% from fat and 7% or less from saturated fats, daily to have 5 or more servings of fruits and vegetables.  Biometrics:     Pre Biometrics - 12/08/15 1036      Pre Biometrics   Grip Strength 25 kg       Nutrition Therapy Plan and Nutrition Goals:     Nutrition Therapy & Goals - 12/28/15 1513      Nutrition Therapy   Diet General, Healthful      Personal Nutrition Goals   Personal Goal #1 Maintain wt around 178 lb while in Pulmonary Rehab     Intervention Plan   Intervention Prescribe, educate and counsel regarding individualized specific dietary modifications aiming towards targeted core components such as weight, hypertension, lipid management, diabetes, heart failure and other comorbidities.   Expected Outcomes Short Term Goal: Understand basic principles of dietary content, such as calories, fat, sodium, cholesterol and nutrients.;Long Term  Goal: Adherence to prescribed nutrition plan.      Nutrition Discharge: Rate Your Plate Scores:     Nutrition Assessments - 01/18/16 1153      Rate Your Plate Scores   Pre Score 57      Psychosocial: Target Goals: Acknowledge presence or absence of depression, maximize coping skills, provide positive support system. Participant is able to verbalize types and ability to use techniques and skills needed for reducing stress and depression.  Initial Review & Psychosocial Screening:     Initial Psych Review & Screening - 12/08/15 1045      Initial Review   Current issues with Current Stress Concerns   Comments family relationships     Family Dynamics   Good Support System? Yes     Barriers   Psychosocial barriers to participate in program There are no identifiable barriers or psychosocial needs.     Screening Interventions   Interventions Encouraged to exercise      Quality of Life Scores:   PHQ-9: Recent Review Flowsheet Data    Depression screen [facility] 2/9 12/08/2015 12/28/2014 02/14/2014   Decreased Interest 0 0 0   Down, Depressed, Hopeless 0 0 0   PHQ - 2 Score 0 0 0      Psychosocial Evaluation and Intervention:     Psychosocial Evaluation - 12/08/15 1046      Psychosocial Evaluation & Interventions   Interventions Encouraged to exercise with the program and follow exercise prescription      Psychosocial Re-Evaluation:     Psychosocial Re-Evaluation    Row Name 01/23/16 0746 02/20/16 [id number] 03/19/16 0835         Psychosocial Re-Evaluation   Interventions Encouraged to attend Pulmonary Rehabilitation for the exercise Encouraged to attend Pulmonary Rehabilitation for the exercise Encouraged to attend Pulmonary Rehabilitation for the exercise     Comments there are no psychosocial barriers to participation identified within the past 30 days there are no psychosocial barriers to participation identified within the past 30 days there are no psychosocial  barriers to participation identified within the past 30 days       Education: Education Goals: Education classes will be provided on a weekly basis, covering required topics. Participant will state understanding/return demonstration of topics presented.  Learning Barriers/Preferences:     Learning Barriers/Preferences - 12/08/15 1035      Learning Barriers/Preferences   Learning Barriers None   Learning Preferences Computer/Internet;Group Instruction;Individual Instruction;Verbal Instruction;Written Material      Education  Topics: Risk Factor Reduction:  -Group instruction that is supported by a PowerPoint presentation. Instructor discusses the definition of a risk factor, different risk factors for pulmonary disease, and how the heart and lungs work together.     Nutrition for Pulmonary Patient:  -Group instruction provided by PowerPoint slides, verbal discussion, and written materials to support subject matter. The instructor gives an explanation and review of healthy diet recommendations, which includes a discussion on weight management, recommendations for fruit and vegetable consumption, as well as protein, fluid, caffeine, fiber, sodium, sugar, and alcohol. Tips for eating when patients are short of breath are discussed. Flowsheet Row PULMONARY REHAB CHRONIC OBSTRUCTIVE PULMONARY DISEASE from 03/14/2016 in [place]  Date  02/22/16 [facility] Eating During the [place]  Educator  RD  Instruction Review Code  2- meets goals/outcomes      Pursed Lip Breathing:  -Group instruction that is supported by demonstration and informational handouts. Instructor discusses the benefits of pursed lip and diaphragmatic breathing and detailed demonstration on how to preform both.     Oxygen Safety:  -Group instruction provided by PowerPoint, verbal discussion, and written material to support subject matter. There is an overview of "What is Oxygen" and "Why do  we need it".  Instructor also reviews how to create a safe environment for oxygen use, the importance of using oxygen as prescribed, and the risks of noncompliance. There is a brief discussion on traveling with oxygen and resources the patient may utilize. Flowsheet Row PULMONARY REHAB CHRONIC OBSTRUCTIVE PULMONARY DISEASE from 03/14/2016 in [place]  Date  02/15/16  Educator  RN  Instruction Review Code  2- meets goals/outcomes      Oxygen Equipment:  -Group instruction provided by [facility] Staff utilizing handouts, written materials, and equipment demonstrations. Flowsheet Row PULMONARY REHAB CHRONIC OBSTRUCTIVE PULMONARY DISEASE from 03/14/2016 in [place]  Date  02/29/16  Educator  rep  Instruction Review Code  2- meets goals/outcomes      Signs and Symptoms:  -Group instruction provided by written material and verbal discussion to support subject matter. Warning signs and symptoms of infection, stroke, and heart attack are reviewed and when to call the physician/911 reinforced. Tips for preventing the spread of infection discussed. Flowsheet Row PULMONARY REHAB CHRONIC OBSTRUCTIVE PULMONARY DISEASE from 03/14/2016 in [place]  Date  01/11/16  Educator  RN  Instruction Review Code  2- meets goals/outcomes      Advanced Directives:  -Group instruction provided by verbal instruction and written material to support subject matter. Instructor reviews Advanced Directive laws and proper instruction for filling out document.   Pulmonary Video:  -Group video education that reviews the importance of medication and oxygen compliance, exercise, good nutrition, pulmonary hygiene, and pursed lip and diaphragmatic breathing for the pulmonary patient. Flowsheet Row PULMONARY REHAB CHRONIC OBSTRUCTIVE PULMONARY DISEASE from 03/14/2016 in [place]  Date   02/01/16  Instruction Review Code  2- meets goals/outcomes      Exercise for the Pulmonary Patient:  -Group instruction that is supported by a PowerPoint presentation. Instructor discusses benefits of exercise, core components of exercise, frequency, duration, and intensity of an exercise routine, importance of utilizing pulse oximetry during exercise, safety while exercising, and options of places to exercise outside of rehab.   Flowsheet Row PULMONARY REHAB CHRONIC OBSTRUCTIVE PULMONARY DISEASE from 03/14/2016 in [place]  Date  01/04/16  Educator  EP  Instruction Review Code  R- Review/reinforce      Pulmonary Medications:  -Verbally interactive group education provided by instructor with focus on inhaled medications and proper administration.   Anatomy and Physiology of the Respiratory System and Intimacy:  -Group instruction provided by PowerPoint, verbal discussion, and written material to support subject matter. Instructor reviews respiratory cycle and anatomical components of the respiratory system and their functions. Instructor also reviews differences in obstructive and restrictive respiratory diseases with examples of each. Intimacy, Sex, and Sexuality differences are reviewed with a discussion on how relationships can change when diagnosed with pulmonary disease. Common sexual concerns are reviewed.   Knowledge Questionnaire Score:   Core Components/Risk Factors/Patient Goals at Admission:     Personal Goals and Risk Factors at Admission - 12/08/15 1036      Core Components/Risk Factors/Patient Goals on Admission    Weight Management Weight Loss;Yes   Admit Weight 185 lb (83.9 kg)   Goal Weight: Long Term 170 lb (77.1 kg)   Expected Outcomes Weight Loss: Understanding of general recommendations for a balanced deficit meal plan, which promotes 1-2 lb weight loss per week and includes a negative energy balance of [phone]  kcal/d;Understanding recommendations for meals to include 15-35% energy as protein, 25-35% energy from fat, 35-60% energy from carbohydrates, less than 200mg  of dietary cholesterol, 20-35 gm of total fiber daily;Understanding of distribution of calorie intake throughout the day with the consumption of 4-5 meals/snacks;Short Term: Continue to assess and modify interventions until short term weight is achieved;Long Term: Adherence to nutrition and physical activity/exercise program aimed toward attainment of established weight goal   Increase Strength and Stamina Yes   Intervention Provide advice, education, support and counseling about physical activity/exercise needs.;Develop an individualized exercise prescription for aerobic and resistive training based on initial evaluation findings, risk stratification, comorbidities and participant's personal goals.   Expected Outcomes Achievement of increased cardiorespiratory fitness and enhanced flexibility, muscular endurance and strength shown through measurements of functional capacity and personal statement of participant.   Improve shortness of breath with ADL's Yes   Intervention Provide education, individualized exercise plan and daily activity instruction to help decrease symptoms of SOB with activities of daily living.   Expected Outcomes Short Term: Achieves a reduction of symptoms when performing activities of daily living.   Develop more efficient breathing techniques such as purse lipped breathing and diaphragmatic breathing; and practicing self-pacing with activity Yes   Intervention Provide education, demonstration and support about specific breathing techniuqes utilized for more efficient breathing. Include techniques such as pursed lipped breathing, diaphragmatic breathing and self-pacing activity.   Expected Outcomes Short Term: Participant will be able to demonstrate and use breathing techniques as needed throughout daily activities.   Increase  knowledge of respiratory medications and ability to use respiratory devices properly  Yes   Intervention Provide education and demonstration as needed of appropriate use of medications, inhalers, and oxygen therapy.   Expected Outcomes Short Term: Achieves understanding of medications use. Understands that oxygen is a medication prescribed by physician. Demonstrates appropriate use of inhaler and oxygen therapy.      Core Components/Risk Factors/Patient Goals Review:      Goals and Risk Factor Review    Row Name 01/18/16 1155 01/23/16 0742 02/20/16 0652 03/19/16 0835       Core Components/Risk Factors/Patient Goals Review   Personal Goals Review Weight Management/Obesity Increase knowledge of respiratory medications and ability to use respiratory devices properly.;Sedentary;Improve shortness of breath with  ADL's;Increase Strength and Stamina;Develop more efficient breathing techniques such as purse lipped breathing and diaphragmatic breathing and practicing self-pacing with activity. Increase knowledge of respiratory medications and ability to use respiratory devices properly.;Sedentary;Improve shortness of breath with ADL's;Increase Strength and Stamina;Develop more efficient breathing techniques such as purse lipped breathing and diaphragmatic breathing and practicing self-pacing with activity. Increase knowledge of respiratory medications and ability to use respiratory devices properly.;Sedentary;Improve shortness of breath with ADL's;Increase Strength and Stamina;Develop more efficient breathing techniques such as purse lipped breathing and diaphragmatic breathing and practicing self-pacing with activity.    Review see 01/18/16 RD note see "comments" section on ITP see "comments" section on ITP see "comments" section on ITP    Expected Outcomes Pt to maintain his wt around 178 lb while in Pulmonary Rehab see "Admission" expected outcomes see "Admission" expected outcomes see "Admission" expected  outcomes       Core Components/Risk Factors/Patient Goals at Discharge (Final Review):      Goals and Risk Factor Review - 03/19/16 0835      Core Components/Risk Factors/Patient Goals Review   Personal Goals Review Increase knowledge of respiratory medications and ability to use respiratory devices properly.;Sedentary;Improve shortness of breath with ADL's;Increase Strength and Stamina;Develop more efficient breathing techniques such as purse lipped breathing and diaphragmatic breathing and practicing self-pacing with activity.   Review see "comments" section on ITP   Expected Outcomes see "Admission" expected outcomes      ITP Comments:   Comments: ITP REVIEW Pt is making expected progress toward pulmonary rehab goals after completing 21 sessions, however he is physically capable to exceeding his expected goals. Will speak to patient today to see if he would like to extend his sessions to 28 instead of graduating on 12/12. Recommend continued exercise, life style modification, education, and utilization of breathing techniques to increase stamina and strength and decrease shortness of breath with exertion.

## 2016-03-19 NOTE — Progress Notes (Signed)
Daily Session Note  Patient Details  Name: Jeremiah [name] MRN: [id number] Date of Birth: 03/24/1946 Referring Provider:   Flowsheet Row Pulmonary Rehab Walk Test from 12/12/2015 in [facility]  Referring Provider  Dr. Wert      Encounter Date: 03/19/2016  Check In:     Session Check In - 03/19/16 1030      Check-In   Location [facility]   Staff Present Molly diVincenzo, MS, ACSM RCEP, Exercise Physiologist;Joan Behrens, RN, BSN;[name] Hughes, RN; , RN, BSN   Supervising physician immediately available to respond to emergencies [facility] immediately available   Physician(s) Dr. Mikhail   Medication changes reported     No   Fall or balance concerns reported    No   Warm-up and Cool-down Performed as group-led instruction   Resistance Training Performed Yes   VAD Patient? No     Pain Assessment   Currently in Pain? No/denies   Multiple Pain Sites No      Capillary Blood Glucose: No results found for this or any previous visit (from the past 24 hour(s)).      Exercise Prescription Changes - 03/19/16 1213      Exercise Review   Progression Yes     Response to Exercise   Blood Pressure (Admit) 120/70   Blood Pressure (Exercise) 138/80   Blood Pressure (Exit) 120/60   Heart Rate (Admit) 72 bpm   Heart Rate (Exercise) 97 bpm   Heart Rate (Exit) 67 bpm   Oxygen Saturation (Admit) 96 %   Oxygen Saturation (Exercise) 92 %   Oxygen Saturation (Exit) 95 %   Rating of Perceived Exertion (Exercise) 13   Perceived Dyspnea (Exercise) 2   Duration Progress to 45 minutes of aerobic exercise without signs/symptoms of physical distress   Intensity THRR unchanged     Progression   Progression Continue to progress workloads to maintain intensity without signs/symptoms of physical distress.     Resistance Training   Training Prescription Yes   Weight blue bands   Reps 10-12  10 minutes of strength training     Interval Training   Interval Training No     Treadmill   MPH 2.7   Grade 4   Minutes 17     NuStep   Level 6   Minutes 17   METs 4.8     Rower   Level 4   Minutes 17     Goals Met:  Independence with exercise equipment Using PLB without cueing & demonstrates good technique No report of cardiac concerns or symptoms Strength training completed today  Goals Unmet:  coughing with exercise resolved with rescue inhaler use  Comments: Service time is from 1030 to 1205   Dr. Wesam G. Yacoub is Medical Director for Pulmonary Rehab at [facility]. 

## 2016-03-19 NOTE — Progress Notes (Signed)
Jeremiah [name] 70 y.o. male  12 day Psychosocial Note  Patient psychosocial assessment reveals no barriers to participation in Pulmonary Rehab. Jeremiah [name] has had 90% participation since admission. He is making expected progress toward pulmonary rehab goals after completing 21 sessions, however he is physically capable to exceeding his expected goals. Will speak to patient today to see if he would like to extend his sessions to 28 instead of graduating on 12/12. He continues to having a better outlook on life this time in rehab as compared to his last admission. Patient does continue to exhibit positive coping skills to deal with any psychosocial concerns that may arrise. Offered emotional support and reassurance. Patient does feel he is making significant progress toward Pulmonary Rehab goals. Patient reports his health and activity level has improved in the past 30 days as evidenced by patient's report of increased ability to exercise, however he has developed a cough over the last couple of days. His cough improves with his SABA. He continues to walk 30 min daily on the days he is not in pulmonary rehab.Patient states family/friends have noticed changes in his activity or mood. His wife has also noticed a decrease in his shortness of breath with activity, but an increase in cough. He has been instructed to call his pulmonologist if the cough does not resolve soon or if symptoms get worse. Patient reports feeling positive about current and projected progression in Pulmonary Rehab. After reviewing the patient's treatment plan, the patient is making progress toward Pulmonary Rehab goals. Patient's rate of progress toward rehab goals is good. Plan of action to help patient continue to work towards rehab goals include increasing workloads as tolerated on equipment in order to increase stamina and strength. Will continue to monitor and evaluate progress toward psychosocial goal(s).  Goal(s) in progress: Improved  management of anxiety associated with shortness of breath  Help patient work toward returning to meaningful activities that improve patient's QOL and are attainable with patient's lung disease

## 2016-03-21 ENCOUNTER — Encounter (HOSPITAL_COMMUNITY)
Admission: RE | Admit: 2016-03-21 | Discharge: 2016-03-21 | Disposition: A | Discharge status: Home / Self Care | Payer: Medicare Other | Source: Ambulatory Visit | Attending: Internal Medicine | Admitting: Internal Medicine

## 2016-03-21 ENCOUNTER — Ambulatory Visit: Payer: Non-veteran care | Admitting: Internal Medicine

## 2016-03-21 VITALS — Wt 179.7 lb

## 2016-03-21 DIAGNOSIS — J449 Chronic obstructive pulmonary disease, unspecified: Secondary | ICD-10-CM

## 2016-03-21 NOTE — Progress Notes (Signed)
Daily Session Note  Patient Details  Name: Jeremiah [name] MRN: [id number] Date of Birth: 09-Jun-1945 Referring Provider:   [name] Pulmonary Rehab Walk Test from 12/12/2015 in [place]  Referring Provider  Dr. Melvyn Novas      Encounter Date: 03/21/2016  Check In:     Session Check In - 03/21/16 1053      Check-In   Location [facility]   Staff Present Su Hilt, MS, ACSM RCEP, Exercise Physiologist;Joan [place] Reeves, RN, BSN;Jacquis Paxton, RN;Portia Rollene Rotunda, RN, BSN   Supervising physician immediately available to respond to emergencies [facility] immediately available   Physician(s) Dr. Allyson Sabal   Medication changes reported     No   Fall or balance concerns reported    No   Warm-up and Cool-down Performed as group-led instruction   Resistance Training Performed Yes   VAD Patient? No     Pain Assessment   Currently in Pain? No/denies      Capillary Blood Glucose: No results found for this or any previous visit (from the past 24 hour(s)).      Exercise Prescription Changes - 03/21/16 1200      Response to Exercise   Blood Pressure (Admit) 130/70   Blood Pressure (Exercise) 150/80   Blood Pressure (Exit) 132/74   Heart Rate (Admit) 63 bpm   Heart Rate (Exercise) 76 bpm   Heart Rate (Exit) 63 bpm   Oxygen Saturation (Admit) 96 %   Oxygen Saturation (Exercise) 94 %   Oxygen Saturation (Exit) 95 %   Rating of Perceived Exertion (Exercise) 15   Perceived Dyspnea (Exercise) 2   Duration Progress to 45 minutes of aerobic exercise without signs/symptoms of physical distress   Intensity THRR unchanged     Progression   Progression Continue to progress workloads to maintain intensity without signs/symptoms of physical distress.     Resistance Training   Training Prescription Yes   Weight blue bands   Reps 10-12  10 minutes of strength training     Interval Training   Interval Training No     NuStep   Level 6   Minutes 17   METs 3.7     Rower   Level 6   Minutes 17     Goals Met:  Exercise tolerated well No report of cardiac concerns or symptoms Strength training completed today  Goals Unmet:  Not Applicable  Comments: Service time is from 1030 to 1210    Dr. Rush Farmer is Medical Director for Pulmonary Rehab at [facility].

## 2016-03-22 ENCOUNTER — Ambulatory Visit (INDEPENDENT_AMBULATORY_CARE_PROVIDER_SITE_OTHER): Payer: Medicare Other | Admitting: Internal Medicine

## 2016-03-22 ENCOUNTER — Other Ambulatory Visit: Payer: Medicare Other

## 2016-03-22 ENCOUNTER — Encounter: Payer: Self-pay | Admitting: Internal Medicine

## 2016-03-22 VITALS — BP 130/72 | HR 55 | Ht 72.0 in | Wt 176.0 lb

## 2016-03-22 DIAGNOSIS — I2510 Atherosclerotic heart disease of native coronary artery without angina pectoris: Secondary | ICD-10-CM

## 2016-03-22 DIAGNOSIS — J441 Chronic obstructive pulmonary disease with (acute) exacerbation: Secondary | ICD-10-CM

## 2016-03-22 DIAGNOSIS — J449 Chronic obstructive pulmonary disease, unspecified: Secondary | ICD-10-CM

## 2016-03-22 LAB — PULMONARY FUNCTION TEST
FEF 25-75 POST: 0.94 L/s
FEF 25-75 PRE: 0.93 L/s
FEF2575-%Change-Post: 0 %
FEF2575-%PRED-POST: 34 %
FEF2575-%Pred-Pre: 33 %
FEV1-%CHANGE-POST: -1 %
FEV1-%PRED-POST: 56 %
FEV1-%PRED-PRE: 57 %
FEV1-POST: 1.79 L
FEV1-Pre: 1.82 L
FEV1FVC-%Change-Post: -7 %
FEV1FVC-%PRED-PRE: 70 %
FEV6-%CHANGE-POST: 4 %
FEV6-%PRED-POST: 85 %
FEV6-%Pred-Pre: 81 %
FEV6-POST: 3.4 L
FEV6-Pre: 3.27 L
FEV6FVC-%CHANGE-POST: -1 %
FEV6FVC-%PRED-POST: 100 %
FEV6FVC-%Pred-Pre: 102 %
FVC-%Change-Post: 5 %
FVC-%PRED-POST: 84 %
FVC-%Pred-Pre: 79 %
FVC-Post: 3.53 L
FVC-Pre: 3.33 L
POST FEV1/FVC RATIO: 51 %
PRE FEV1/FVC RATIO: 55 %
PRE FEV6/FVC RATIO: 98 %
Post FEV6/FVC ratio: 96 %

## 2016-03-22 NOTE — Progress Notes (Signed)
pft  

## 2016-03-22 NOTE — Patient Instructions (Addendum)
ICD-9-CM ICD-10-CM   1. Stage 2 moderate COPD by GOLD classification ([place]) 496 J44.9   2. COPD, frequent exacerbations ([place]) 491.21 J44.1   3. Coronary artery calcification seen on computed tomography 414.00 I25.10    #COPD Currently improved and stable copd - gold stage 2 mopderate No evidence of pulmonary fibrosis or caner Unclear why frequent flare ups  PLAN - check alpha 1 genetic test copd  - continue spiriva scheduled  - continue rehab - change symbicort to breo low dose scheduled - empiric change - prevent flare ups - options are roflumilast  V Danrixin COPD Trial  - take product sheet on roflumilast  - take [facility] copy on danrixin copd trial - continue acute phase of rehab month 2 currently  #  coronary artery calcification seen on CT  -  please refer to cardiologist - [facility] or Dr Einar Gip, first available  #Followup  - 2or sooner if needed

## 2016-03-22 NOTE — Addendum Note (Signed)
Addended by: Collier Salina on: 03/22/2016 11:45 AM   Modules accepted: Orders

## 2016-03-22 NOTE — Progress Notes (Addendum)
Subjective:     Patient ID: Jeremiah [name], male   DOB: Aug 16, 1945, 70 y.o.   MRN: [id number]  HPI Brief patient profile:  70 yobm quit smoking 1989 with no problems with variable sob x 2004 and maintained on inhalers referred 05/03/2013 by Dr Titus Mould for evaluation of outpt management for copd after hospitalization and found to have GOLD III COPD 01/2014  with tendency to panic when sob.  Admit date: 04/24/2013  Discharge date: 04/27/2013  Discharge Diagnoses:  Active Problems:  COPD exacerbation  Detailed Hospital Course:  70 yo male with abrupt onset of sob (1/10) that progressively worsened until EMS called. Patient intubated in ED due to severe respiratory distress. In route, patient had increasing tachypnea, decline SpO2, and obtundation. Patient with GCS <8 at arrival, evidence of emesis, intubated for severe respiratory distress. Family reported no evidence of fevers, chills, malaise, worsening coughing, and sob. Initial episode passed, however sister received a call later with him reporting a return of symptoms, complaints of inability of breathing and EMS alerted. Distant history of tobacco abuse. Has never been hospitalized for COPD before this episode. He has had no recent hospitalizations, and has never been previously intubated.  He was admitted to the ICU on 1/11 and was treated with the typical modalities for acute respiratory failure (for presumed AECODP) which included mechanical ventilation with sedation, inhaled bronchodilators, antibiotics, and systemic steroid therapy. He improved later that day and was able to be extubated and sedation was stopped. His care since that point has primarily focused on the de-escalation of the therapies mentioned above. On 1/13 he has returned to a near-baseline state of health and has been deemed a candidate for discharge.  Discharge Plan by diagnoses  Acute exacerbation of COPD  Acute hypercarbic respiratory failure (resolved)  plan  -Follow up  with Dr. Melvyn Novas on 05/03/2013 at 2:30PM  -Change steroids to PO Prednisone, taper over 7-10d  -Resume home dose BDs    History of Present Illness  05/03/2013 1st [facility] office visit/ Wert cc more sob since Oct 2014 on symbicort 160 2 bid and spiriva daily  But still  daily use of proventil and no need for neb then needed neb x sev months prior to his admit above but back to baseline since d/c on pred still tapering off but using saba qid, not prn "out of habit" >>return for PFT   01/03/2014 Follow up  [name] .  Complains of increased episodes of increased DOE, wheezing, prod cough with white/clear mucus worse for last week. Family member says he has been progressively get worse over last year. Was admitted 04/2013 w/ COPD flare /VDRF . [name] from last office visit however did not follow up .   Followed at [facility] , says he was told he has Moderate COPD 6 yrs ago. On Symbicort and Spiriva.  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  Says he gets winded easily esp on incline.  Says worse for last week with increased cough and wheezing.  No hemotpysis, wt loss, calf pain, n/v/d, chest pain or fever.  Last abx ~2 months ago.  rec Doxycycline 100mg  Twice daily  For 7 days  Prednisone taper over next week Mucinex DM Twice daily  As needed  Cough /congestion  Continue on Symbicort and Spiriva .    01/24/2014 f/u ov/Wert re:  GOLD III  Chief Complaint  Patient presents with  . Follow-up    PFT done today. Pt states that SOB and cough have improved some, but not back to normal baseline. Cough is prod with minimal clear sputum.   can do HT but no mall due to sob Prednisone really helped a lot but did not maintain improvement once finished >>pred taper add ppi bid and diet     12/12/2014 acute  ov/Wert re: ? aecopd /new gi/gu complaints ? From spiriva?   Chief Complaint   Patient presents with  . Acute Visit    Pt c/o increased SOB and cough x 4 days. Cough is not very prod- clear sputum. He also c/o increased gas and bloating.   downhill since prev ov, changing his own maint rx (dc'd pepcid) >  severe cough at hs onset 12/09/14  On spiriva lots of gi/gu issues  Started pred one day prior to OV  As per calendar action plan  Has only used saba so far in hfa form, has neb but doesn't feel he needs it yet.  >changed spiriva to [place]     04/04/2015  f/u ov/Wert re: copd  Chief Complaint  Patient presents with  . Follow-up    pt following for COPD: pt states hes been its been on and off. pt states 3 weekws ago he was diagnosed with bronchitits but he feels much better. pt c/o a dry cough thats been bothering him mostly in the night time. pt states at times he has to take the CPAP off because he has these couhing fits. no c/o SOB , wheezing, or chest tightness.   has med calendar but not following the action plans at the bottom for cough  > For cough/ congestion > mucinex dm and flutter valve       07/20/2015  f/u ov/Wert re: aecopd/ using med calendar though not flutter or approp dose of mucinex dm/ confused with when to use pred  Chief Complaint  Patient presents with  . Follow-up    pt c/o nonprod cough, chest congestion, sob X4-5 days.  S/s worse qhs.    last prednisone finished on 07/16/15 and getting worse again in terms of breathing and dry coughing rec In the event that you need your nebulizer more than rarely for your breathing: prednisone 20 mg daily until better  Then 10 mg daily x 5 days and stop For cough > mucinex dm 1200 mg every 12 hours and use the flutter valve Work on inhaler technique    08/29/2015  f/u ov/Wert re:  GOLD III  symbicort 160 2bid/ tudorza  Chief Complaint  Patient presents with  . Follow-up    Cough has not improved since the last visit. He uses ventolin at least once per day and neb with albuterol 2 x daily on  average.   Still using oil based vit D, following med calendar better / rarely taking prednisone but helps when does  Lots of hoarseness and dry cough  Day >   >stop [place] and begin Spiriva     11/01/2015 NP  Acute OV  Presents for an acute office visit Patient complains of 2 weeks of increased cough, shortness of breath and wheezing. Feels that the high temperatures and humidity or making his breathing worse. Wears out with minimum activity   Denies any chest congestion/tightness, sinus pressure/drainage, fever, nausea or vomiting.  Has any fever or discolored mucus. Remains on Symbicort and Spiriva. Did start on prednisone over the last  1-2 days. Still has persistent cough and wheezing. Cough is keeping him up at night.. Patient and wife have multiple questions about his severity of his underlying COPD. He went over purse lip breathing. He would like to try pulmonary rehabilitation again. rec Zpack to have on hold if symtpoms worsen with discolored mucus.  Prednisone taper over next week.  Hydromet 1 tsp every 6hr, As needed  Cough , may make sleepy.  Refer to pulmonary rehab    12/25/2015  f/u ov/Wert re: GOLD III copd/ symb/spiriva - using med calendar well  Chief Complaint  Patient presents with  . Follow-up    Doing well and denies any co's today.   doe [facility] = can't walk a nl pace on a flat grade s sob but does fine slow and flat eg  WM shopping ok p using [facility] parking - rare saba need -has not needed pred recently main complaint is abd distention/ constipation  rec Try spiriva just one puff each am to see if bloating/constipation improve  and if so leave the dose just at one pff - if not ok try off completely after a few weeks of one pff but if off it makes no difference at all then just restart it at 2 each am as per med calendar  See calendar for specific medication instructions    01/17/2016 acute extended ov/Wert re: GOLD III/ aecopd  Chief Complaint  Patient presents  with  . Acute Visit    Pt c/o cough x 5 days- prod with min clear sputum.  He had nosebleed last night.    severe cough onset 10/1 > min white mucus, worse at hs  Confused with names of meds and correlating them with med calendar though did bring it with him  Did not previously disclose use of tessalon but says now he was using it prn and worked fine until it ran out  Note note now on tenoretic, also not on med calendar  rec For dry cough as per med calendar > tessalon pearls 200 mg every 4-6 hours as needed and use the flutter valve For congested cough > mucinex dm up to 1200  Every 12 hours and use the flutter valve also  Increase the spiriva back to 2 puffs each am  See calendar for specific medication instructions   01/25/2016 acute extended ov/Wert re: copd gold III/ chronic dry cough  Chief Complaint  Patient presents with  . Acute Visit    Pt. c/o coughing on going  for couple of months, frequent coughing spills, some clear mucus, some sob, some wheezing, some chest pain after having coughing spells   improving ex tol at rehab s 02 Cough is worse around 3 am nightly x months but no excess mucus/ just dry hack ? Really using pepcid at hs as per med calendar  Has pred to take for flare of sob but not cough so has not tried it yet  >>pred taper , changed atenolol to bisoprolol    02/12/2016 Acute OV : COPD  He says he is feeling some better but has not gotten over his cough yet.  He started on tessalon last ov which is helping some.  Seen 2 weeks ago , given prednisone taper and change from atenolol to bisoprolol.  Chest xray last ov with copd changes , no acute process.  He denies chest pain , orthopnea, edema or fever.  Cough is mainly dry , sometimes worse at night .  Him and his  wife are worried why the cough does not go away .     OV 03/22/2016  Chief Complaint  Patient presents with  . Follow-up    Changing from MW to MR. Pt here after PFT. Pt c/o dry cough and SOB  at baseline. Pt denies CP/tightness and f/c/s.     70 year old male presents with his wife. They've been married for 2 years. In 2015 he had significant life-threatening COPD exacerbation. Since then been on triple inhaler therapy. Currently stable. He is doing pulmonary rehabilitation maintenance phase of the second time. Overall he tells me that his major complains of moderate degree of cough and shortness of breath and wheezing. The cough is essentially dry. At pulmonary rehabilitation he does not desaturate. He has been doing this for one month. He'll finish pulmonary rehabilitation and another one month. Wife's main concern is that he's having frequent COPD exacerbations. According to her at least 5 times he's had COPD exacerbation requiring prednisone and antibiotic of both in the year 2017. Most recently was 2 weeks ago. Emotions trigger it. The house does not have any pets or bad carpet or mold or mildew or cockroach.  He underwent Pulmi function testing today and compared to 2015 he is actually improved to go stage II COPD. Previously was Gold stage III COPD. He had high resolution CT chest that shows coronary artery calcification for which I do not see any evidence of cardiac workup. He has emphysema but no evidence of interstitial lung disease or cancer.  Currently he does well. It is noted that he is on testosterone shots   CAT COPD Symptom & Quality of Life Score ([place]) 0 is no burden. 5 is highest burden 03/22/2016   Never Cough -> Cough all the time 4  No phlegm in chest -> Chest is full of phlegm 1  No chest tightness -> Chest feels very tight 1  No dyspnea for 1 flight stairs/hill -> Very dyspneic for 1 flight of stairs 4  No limitations for ADL at home -> Very limited with ADL at home 4  Confident leaving home -> Not at all confident leaving home 0  Sleep soundly -> Do not sleep soundly because of lung condition 0  Lots of Energy -> No energy at all 3  TOTAL Score (max  40)  17      IMPRESSION: 1. No evidence of interstitial lung disease. 2. Moderate centrilobular emphysema and mild diffuse bronchial wall thickening, consistent with the provided history of COPD. 3. Small parenchymal bands in the posterior basilar bilateral lower lobes, most consistent with mild postinfectious/postinflammatory scarring. 4. Aortic atherosclerosis. Ectatic ascending thoracic aorta, maximum diameter 4.1 cm. Recommend annual imaging followup by CTA or MRA. This recommendation follows 2010 ACCF/AHA/AATS/ACR/ASA/SCA/SCAI/SIR/STS/SVM Guidelines for the Diagnosis and Management of Patients with Thoracic Aortic Disease. Circulation. 2010; 121: [id number]. 5. Three-vessel coronary atherosclerosis.   Electronically Signed   By: Ilona Sorrel M.D.   On: 02/20/2016 13:59    Results for DICKSON, BARFIELD (MRN [id number]) as of 03/22/2016 11:11  Ref. Range 01/24/2014 10:51 03/22/2016 09:59  FVC-Pre Latest Units: L 2.66 3.33  FVC-%Pred-Pre Latest Units: % 54 79  FEV1-Pre Latest Units: L 1.30 1.82  FEV1-%Pred-Pre Latest Units: % 35 57  Results for XAYVIER, MOCCIA (MRN [id number]) as of 03/22/2016 11:11  Ref. Range 01/24/2014 10:51 03/22/2016 09:59  FVC-Post Latest Units: L 2.75 3.53  FVC-%Pred-Post Latest Units: % 56 84  FVC-%Change-Post Latest Units: % 3 5  FEV1-Post Latest Units: L  1.26 1.79  FEV1-%Pred-Post Latest Units: % 34 56  Results for SAMIK, BAPST (MRN [id number]) as of 03/22/2016 11:11  Ref. Range 01/24/2014 10:51  DLCO unc Latest Units: ml/min/mmHg 17.16  DLCO unc % pred Latest Units: % 48     has a past medical history of COPD (chronic obstructive pulmonary disease) ([place]); Depression; Hypertension; PTSD (post-traumatic stress disorder); and Sleep apnea.   reports that he quit smoking about 28 years ago. His smoking use included Cigarettes. He has a 30.00 pack-year smoking history. He has never used smokeless tobacco.  Past Surgical History:  Procedure Laterality  Date  . HAMMER TOE SURGERY  April 2017    No Known Allergies  Immunization History  Administered Date(s) Administered  . Influenza Split 01/13/2013  . Influenza, High Dose Seasonal PF 12/28/2014, 01/15/2016  . Influenza,inj,Quad PF,36+ Mos 01/24/2014  . Pneumococcal-Unspecified 04/15/2010  . Tdap 12/28/2014    Family History  Problem Relation Age of Onset  . Asthma Maternal Grandmother   . Heart disease Father   . Dementia Mother      Current Outpatient Prescriptions:  .  albuterol (PROVENTIL HFA;VENTOLIN HFA) 108 (90 BASE) MCG/ACT inhaler, Inhale 2 puffs into the lungs every 4 (four) hours as needed for wheezing or shortness of breath (((PLAN B)))., Disp: 1 Inhaler, Rfl: 5 .  albuterol (PROVENTIL) (2.5 MG/3ML) 0.083% nebulizer solution, Take 3 mLs by nebulization every 4 (four) hours as needed for wheezing or shortness of breath (((PLAN C))). , Disp: , Rfl:  .  ALPRAZolam (XANAX) 0.5 MG tablet, TAKE 1 TABLET BY MOUTH TWICE A DAY AS NEEDED FOR ANXIETY, Disp: 60 tablet, Rfl: 1 .  B-D 3CC LUER-LOK SYR 21GX1-1/2 21G X 1-1/2" 3 ML MISC, USE NEEDLE/SYRINGE TO INJECT TESTOSTERONE INTO SKIN ONCE EVERY 2 WEEKS., Disp: , Rfl: 0 .  benzonatate (TESSALON) 200 MG capsule, Take 1 capsule (200 mg total) by mouth 3 (three) times daily as needed for cough., Disp: 60 capsule, Rfl: 11 .  bisoprolol-hydrochlorothiazide (ZIAC) 5-6.25 MG tablet, Take 1 tablet by mouth daily., Disp: , Rfl:  .  budesonide-formoterol (SYMBICORT) 160-4.5 MCG/ACT inhaler, Inhale 2 puffs into the lungs 2 (two) times daily., Disp: , Rfl:  .  buPROPion (WELLBUTRIN XL) 150 MG 24 hr tablet, Take 150 mg by mouth daily., Disp: , Rfl:  .  cetirizine (ZYRTEC) 10 MG tablet, Take 10 mg by mouth at bedtime. , Disp: , Rfl:  .  Cholecalciferol (VITAMIN D3) 2000 units TABS, Take 1 tablet by mouth every morning., Disp: , Rfl:  .  dextromethorphan-guaiFENesin (MUCINEX DM) 30-600 MG per 12 hr tablet, Take 87ml every 4 hours as needed with  flutter valve for cough and congestion, Disp: , Rfl:  .  esomeprazole (NEXIUM) 40 MG capsule, Take 40 mg by mouth daily before breakfast. , Disp: , Rfl: 2 .  famotidine (PEPCID) 20 MG tablet, Take 20 mg by mouth at bedtime., Disp: , Rfl:  .  fluticasone (FLONASE) 50 MCG/ACT nasal spray, Place 2 sprays into both nostrils 2 (two) times daily., Disp: , Rfl:  .  magnesium oxide (MAG-OX) 400 MG tablet, Take 400 mg by mouth 2 (two) times daily. For leg cramps, Disp: , Rfl:  .  Multiple Vitamin (CALCIUM COMPLEX PO), Take by mouth. Take 3 tablets in am, Disp: , Rfl:  .  naproxen sodium (ANAPROX) 220 MG tablet, Per bottle as needed for joint pain, Disp: , Rfl:  .  Polyvinyl Alcohol (LIQUIFILM TEARS OP), Reported on 09/13/2015, Disp: , Rfl:  .  Probiotic Product (PHILLIPS COLON HEALTH) CAPS, Take 1 tablet by mouth daily., Disp: , Rfl:  .  Respiratory Therapy Supplies (FLUTTER) DEVI, Use as directed, Disp: 1 each, Rfl: 0 .  testosterone cypionate (DEPOTESTOSTERONE CYPIONATE) 200 MG/ML injection, INJECT 1 ML (200 MG TOTAL) INTO THE MUSCLE EVERY 14 (FOURTEEN) DAYS., Disp: , Rfl: 0 .  Tiotropium Bromide Monohydrate (SPIRIVA RESPIMAT) 2.5 MCG/ACT AERS, 2 each am, Disp: 1 Inhaler, Rfl: 11 .  traZODone (DESYREL) 50 MG tablet, Take 1 tablet by mouth at bedtime as needed., Disp: , Rfl:    Review of Systems     Objective:   Physical Exam  Constitutional: He is oriented to person, place, and time. He appears well-developed and well-nourished. No distress.  HENT:  Head: Normocephalic and atraumatic.  Right Ear: External ear normal.  Left Ear: External ear normal.  Mouth/Throat: Oropharynx is clear and moist. No oropharyngeal exudate.  Eyes: Conjunctivae and EOM are normal. Pupils are equal, round, and reactive to light. Right eye exhibits no discharge. Left eye exhibits no discharge. No scleral icterus.  Neck: Normal range of motion. Neck supple. No JVD present. No tracheal deviation present. No thyromegaly  present.  Cardiovascular: Normal rate, regular rhythm and intact distal pulses.  Exam reveals no gallop and no friction rub.   No murmur heard. Pulmonary/Chest: Effort normal and breath sounds normal. No respiratory distress. He has no wheezes. He has no rales. He exhibits no tenderness.  Abdominal: Soft. Bowel sounds are normal. He exhibits no distension and no mass. There is no tenderness. There is no rebound and no guarding.  Musculoskeletal: Normal range of motion. He exhibits no edema or tenderness.  Lymphadenopathy:    He has no cervical adenopathy.  Neurological: He is alert and oriented to person, place, and time. He has normal reflexes. No cranial nerve deficit. Coordination normal.  Skin: Skin is warm and dry. No rash noted. He is not diaphoretic. No erythema. No pallor.  Psychiatric: He has a normal mood and affect. His behavior is normal. Judgment and thought content normal.  Nursing note and vitals reviewed.    Vitals:   03/22/16 1104  BP: 130/72  Pulse: (!) 55  SpO2: 96%  Weight: 176 lb (79.8 kg)  Height: 6' (1.829 m)    Estimated body mass index is 23.87 kg/m as calculated from the following:   Height as of this encounter: 6' (1.829 m).   Weight as of this encounter: 176 lb (79.8 kg).      Assessment:       ICD-9-CM ICD-10-CM   1. Stage 2 moderate COPD by GOLD classification ([place]) 496 J44.9 Alpha-1 antitrypsin phenotype  2. COPD, frequent exacerbations ([place]) 491.21 J44.1 Alpha-1 antitrypsin phenotype  3. Coronary artery calcification seen on computed tomography 414.00 I25.10        Plan:      #COPD Currently improved and stable copd - gold stage 2 mopderate No evidence of pulmonary fibrosis or caner Unclear why frequent flare ups  PLAN - check alpha 1 genetic test copd  - continue spiriva scheduled  - continue rehab - change symbicort to breo low dose scheduled - empiric change - prevent flare ups - options are roflumilast  V Danrixin COPD Trial -  agree that you want to think about both optons  - take product sheet on roflumilast  - take ICF copy on danrixin copd trial - continue acute phase of rehab month 2 currently  #  coronary artery calcification seen on CT  -  please refer to cardiologist - [facility] or Dr Einar Gip, first available  #Followup  - 61months or sooner if needed   Dr. Brand Males, M.D., [facility].C.P Pulmonary and Critical Care Medicine Staff Physician [place] [facility] Pager: [phone], If no answer or between  15:00h - 7:00h: call [phone]  03/22/2016 11:38 AM

## 2016-03-25 DIAGNOSIS — F4312 Post-traumatic stress disorder, chronic: Secondary | ICD-10-CM | POA: Diagnosis not present

## 2016-03-25 DIAGNOSIS — F411 Generalized anxiety disorder: Secondary | ICD-10-CM | POA: Diagnosis not present

## 2016-03-25 DIAGNOSIS — F331 Major depressive disorder, recurrent, moderate: Secondary | ICD-10-CM | POA: Diagnosis not present

## 2016-03-26 ENCOUNTER — Encounter (HOSPITAL_COMMUNITY)
Admission: RE | Admit: 2016-03-26 | Discharge: 2016-03-26 | Disposition: A | Discharge status: Home / Self Care | Payer: Medicare Other | Source: Ambulatory Visit | Attending: Internal Medicine | Admitting: Internal Medicine

## 2016-03-26 VITALS — Wt 174.2 lb

## 2016-03-26 DIAGNOSIS — J438 Other emphysema: Secondary | ICD-10-CM

## 2016-03-26 DIAGNOSIS — J449 Chronic obstructive pulmonary disease, unspecified: Secondary | ICD-10-CM

## 2016-03-26 NOTE — Progress Notes (Signed)
Daily Session Note  Patient Details  Name: Jeremiah [name] MRN: [id number] Date of Birth: 03/06/1946 Referring Provider:   [name] Pulmonary Rehab Walk Test from 12/12/2015 in [place]  Referring Provider  Dr. Melvyn Novas      Encounter Date: 03/26/2016  Check In:     Session Check In - 03/26/16 1030      Check-In   Location [facility]   Staff Present Rosebud Poles, RN, BSN;Joanthan Hlavacek, MS, ACSM RCEP, Exercise Physiologist;[name] Ysidro Evert, RN;Portia Rollene Rotunda, RN, BSN   Supervising physician immediately available to respond to emergencies [facility] immediately available   Physician(s) Dr. Nada Maclachlan   Medication changes reported     No   Fall or balance concerns reported    No   Warm-up and Cool-down Performed as group-led instruction   Resistance Training Performed Yes   VAD Patient? No     Pain Assessment   Currently in Pain? No/denies   Multiple Pain Sites No      Capillary Blood Glucose: No results found for this or any previous visit (from the past 24 hour(s)).      Exercise Prescription Changes - 03/26/16 1200      Response to Exercise   Blood Pressure (Admit) 144/70   Blood Pressure (Exercise) 174/104   Blood Pressure (Exit) 136/80   Heart Rate (Admit) 74 bpm   Heart Rate (Exercise) 87 bpm   Heart Rate (Exit) 73 bpm   Oxygen Saturation (Admit) 97 %   Oxygen Saturation (Exercise) 94 %   Oxygen Saturation (Exit) 97 %   Rating of Perceived Exertion (Exercise) 12   Perceived Dyspnea (Exercise) 2   Duration Progress to 45 minutes of aerobic exercise without signs/symptoms of physical distress   Intensity THRR unchanged     Progression   Progression Continue to progress workloads to maintain intensity without signs/symptoms of physical distress.     Resistance Training   Training Prescription Yes   Weight blue bands   Reps 10-12  10 minutes of strength training     Interval Training   Interval Training No      Treadmill   MPH 2.7   Grade 4   Minutes 17     NuStep   Level 6   Minutes 17   METs 4.5     Rower   Level 6   Minutes 17     Goals Met:  Exercise tolerated well No report of cardiac concerns or symptoms Strength training completed today  Goals Unmet:  Not Applicable  Comments: Service time is from 10:30am to 12:00pm    Dr. Rush Farmer is Medical Director for Pulmonary Rehab at [facility].

## 2016-03-26 NOTE — Progress Notes (Signed)
Jeremiah [name] presents to pulmonary rehab for his bi-weekly exercise session. I have completed his thirty day face to face review and determined that Jeremiah [name] is on track for meeting their pulmonary rehab goals. There are no barriers identified that will prevent them from continuing their exercise in pulmonary rehab as prescribed.   Rush Farmer, M.D. [facility] Pulmonary/Critical Care Medicine. Pager: [phone]. After hours pager: [phone].

## 2016-03-27 ENCOUNTER — Ambulatory Visit: Payer: Non-veteran care | Admitting: Internal Medicine

## 2016-03-27 LAB — ALPHA-1 ANTITRYPSIN PHENOTYPE: A1 ANTITRYPSIN: 157 mg/dL (ref 83–199)

## 2016-03-28 ENCOUNTER — Encounter (HOSPITAL_COMMUNITY)
Admission: RE | Admit: 2016-03-28 | Discharge: 2016-03-28 | Disposition: A | Discharge status: Home / Self Care | Payer: Medicare Other | Source: Ambulatory Visit | Attending: Internal Medicine | Admitting: Internal Medicine

## 2016-03-28 VITALS — Wt 174.6 lb

## 2016-03-28 DIAGNOSIS — J449 Chronic obstructive pulmonary disease, unspecified: Secondary | ICD-10-CM

## 2016-03-28 DIAGNOSIS — J438 Other emphysema: Secondary | ICD-10-CM

## 2016-03-28 NOTE — Progress Notes (Signed)
Daily Session Note  Patient Details  Name: Jeremiah [name] MRN: [id number] Date of Birth: 1945-12-02 Referring Provider:   [name] Pulmonary Rehab Walk Test from 12/12/2015 in [place]  Referring Provider  Dr. Melvyn Novas      Encounter Date: 03/28/2016  Check In:     Session Check In - 03/28/16 1054      Check-In   Location [facility]   Staff Present Su Hilt, MS, ACSM RCEP, Exercise Physiologist;Joan Leonia Reeves, RN, Luisa Hart, RN, BSN   Supervising physician immediately available to respond to emergencies [facility] immediately available   Physician(s) DR. Karleen Hampshire   Medication changes reported     No   Fall or balance concerns reported    No   Warm-up and Cool-down Performed as group-led instruction   Resistance Training Performed Yes   VAD Patient? No     Pain Assessment   Currently in Pain? No/denies   Multiple Pain Sites No      Capillary Blood Glucose: No results found for this or any previous visit (from the past 24 hour(s)).      Exercise Prescription Changes - 03/28/16 1200      Response to Exercise   Blood Pressure (Admit) 138/80   Blood Pressure (Exercise) 162/92   Blood Pressure (Exit) 132/70   Heart Rate (Admit) 68 bpm   Heart Rate (Exercise) 80 bpm   Heart Rate (Exit) 71 bpm   Oxygen Saturation (Admit) 94 %   Oxygen Saturation (Exercise) 95 %   Oxygen Saturation (Exit) 95 %   Rating of Perceived Exertion (Exercise) 13   Perceived Dyspnea (Exercise) 2   Duration Progress to 45 minutes of aerobic exercise without signs/symptoms of physical distress   Intensity THRR unchanged     Progression   Progression Continue to progress workloads to maintain intensity without signs/symptoms of physical distress.     Resistance Training   Training Prescription Yes   Weight blue bands   Reps 10-12  10 minutes of strength training     Interval Training   Interval Training No     Treadmill    MPH 2.7   Grade 4   Minutes 17     Rower   Level 6   Minutes 17     Goals Met:  Exercise tolerated well No report of cardiac concerns or symptoms Strength training completed today  Goals Unmet:  Not Applicable  Comments: Service time is from 1030 to 1230    Dr. Rush Farmer is Medical Director for Pulmonary Rehab at [facility].

## 2016-03-29 NOTE — Progress Notes (Signed)
Called and spoke to pt. Informed him of the results per MR. Pt verbalized understanding and denied any further questions or concerns at this time.  

## 2016-04-02 ENCOUNTER — Encounter (HOSPITAL_COMMUNITY)
Admission: RE | Admit: 2016-04-02 | Discharge: 2016-04-02 | Disposition: A | Discharge status: Home / Self Care | Payer: Medicare Other | Source: Ambulatory Visit | Attending: Internal Medicine | Admitting: Internal Medicine

## 2016-04-02 VITALS — Wt 173.9 lb

## 2016-04-02 DIAGNOSIS — J449 Chronic obstructive pulmonary disease, unspecified: Secondary | ICD-10-CM

## 2016-04-02 DIAGNOSIS — J438 Other emphysema: Secondary | ICD-10-CM

## 2016-04-02 NOTE — Progress Notes (Signed)
Daily Session Note  Patient Details  Name: Jeremiah [name] MRN: [id number] Date of Birth: 10/07/1945 Referring Provider:   [name] Pulmonary Rehab Walk Test from 12/12/2015 in [place]  Referring Provider  Dr. Melvyn Novas      Encounter Date: 04/02/2016  Check In:     Session Check In - 04/02/16 1207      Check-In   Location [facility]   Staff Present Su Hilt, MS, ACSM RCEP, Exercise Physiologist;Maria Whitaker, RN, Luisa Hart, RN, BSN;Ramon Dredge, RN, [facility]   Supervising physician immediately available to respond to emergencies [facility] immediately available   Physician(s) Dr. Carles Collet   Medication changes reported     No   Fall or balance concerns reported    No   Warm-up and Cool-down Performed as group-led instruction   Resistance Training Performed Yes   VAD Patient? No     Pain Assessment   Currently in Pain? No/denies   Multiple Pain Sites No      Capillary Blood Glucose: No results found for this or any previous visit (from the past 24 hour(s)).      Exercise Prescription Changes - 04/02/16 1200      Exercise Review   Progression Yes     Response to Exercise   Blood Pressure (Admit) 120/76   Blood Pressure (Exercise) 128/80   Blood Pressure (Exit) 114/76   Heart Rate (Admit) 60 bpm   Heart Rate (Exercise) 82 bpm   Heart Rate (Exit) 59 bpm   Oxygen Saturation (Admit) 93 %   Oxygen Saturation (Exercise) 90 %   Oxygen Saturation (Exit) 91 %   Rating of Perceived Exertion (Exercise) 13   Perceived Dyspnea (Exercise) 3   Duration Progress to 45 minutes of aerobic exercise without signs/symptoms of physical distress   Intensity THRR unchanged     Progression   Progression Continue to progress workloads to maintain intensity without signs/symptoms of physical distress.     Resistance Training   Training Prescription Yes   Weight blue bands   Reps 10-12  10 minutes of strength  training     Interval Training   Interval Training No     Treadmill   MPH 2.7   Grade 4   Minutes 17     NuStep   Level 7   Minutes 17   METs 3.5     Rower   Level 6   Minutes 17     Goals Met:  Exercise tolerated well No report of cardiac concerns or symptoms Strength training completed today  Goals Unmet:  Not Applicable  Comments: Service time is from 10:30am to 12:00pm    Dr. Rush Farmer is Medical Director for Pulmonary Rehab at [facility].

## 2016-04-04 ENCOUNTER — Encounter (HOSPITAL_COMMUNITY)
Admission: RE | Admit: 2016-04-04 | Discharge: 2016-04-04 | Disposition: A | Discharge status: Home / Self Care | Payer: Medicare Other | Source: Ambulatory Visit | Attending: Internal Medicine | Admitting: Internal Medicine

## 2016-04-04 DIAGNOSIS — J449 Chronic obstructive pulmonary disease, unspecified: Secondary | ICD-10-CM | POA: Diagnosis not present

## 2016-04-04 NOTE — Progress Notes (Signed)
Daily Session Note  Patient Details  Name: Jeremiah [name] MRN: [id number] Date of Birth: 16-Jan-1946 Referring Provider:   [name] Pulmonary Rehab Walk Test from 12/12/2015 in [place]  Referring Provider  Dr. Melvyn Novas      Encounter Date: 04/04/2016  Check In:   Capillary Blood Glucose: No results found for this or any previous visit (from the past 24 hour(s)).      Exercise Prescription Changes - 04/04/16 1200      Exercise Review   Progression Yes     Response to Exercise   Blood Pressure (Admit) 126/72   Blood Pressure (Exercise) 130/80   Blood Pressure (Exit) 110/72   Heart Rate (Admit) 68 bpm   Heart Rate (Exercise) 79 bpm   Heart Rate (Exit) 70 bpm   Oxygen Saturation (Admit) 91 %   Oxygen Saturation (Exercise) 90 %   Oxygen Saturation (Exit) 93 %   Rating of Perceived Exertion (Exercise) 13   Perceived Dyspnea (Exercise) 2   Duration Progress to 45 minutes of aerobic exercise without signs/symptoms of physical distress   Intensity THRR unchanged     Progression   Progression Continue to progress workloads to maintain intensity without signs/symptoms of physical distress.     Resistance Training   Training Prescription Yes   Weight blue bands   Reps 10-12  10 minutes of strength training     Interval Training   Interval Training No     Treadmill   MPH 2.9   Grade 4   Minutes 17     NuStep   Level 7   Minutes 17   METs 4.1     Goals Met:  Exercise tolerated well No report of cardiac concerns or symptoms Strength training completed today  Goals Unmet:  Not Applicable  Comments: Service time is from 10:30am to 12:00pm    Dr. Rush Farmer is Medical Director for Pulmonary Rehab at [facility].

## 2016-04-05 ENCOUNTER — Telehealth: Payer: Self-pay | Admitting: Adult Health

## 2016-04-05 MED ORDER — PREDNISONE 10 MG PO TABS: ORAL_TABLET | ORAL | 0 refills | Status: DC

## 2016-04-05 NOTE — Telephone Encounter (Signed)
Spoke with pt. States that he is still having issues with his breathing. Reports chest tightness. Denies SOB, wheezing or coughing. States that Memory Dance has helped slightly with the chest tightness but he still feels a lingering tightness. We do not have any available appointments. Pt is requesting to have some prednisone called in as the holiday is coming up. He ask that I send this request to TP.  TP - please advise. Thanks.

## 2016-04-05 NOTE — Telephone Encounter (Signed)
Spoke with pt. He is aware of TP's recommendations. Rx has been sent in. Pt states that he is not having chest pain. Nothing further was needed.

## 2016-04-05 NOTE — Telephone Encounter (Signed)
He can have prednisone taper  Prednisone 10mg  #20 , 4 tabs for 2 days, then 3 tabs for 2 days, 2 tabs for 2 days, then 1 tab for 2 days, then stop  No refills Make sure he is not having Chest pain , if so need ER evaluation  Needs ov for follow up  Please contact office for sooner follow up if symptoms do not improve or worsen or seek emergency care

## 2016-04-09 ENCOUNTER — Telehealth (HOSPITAL_COMMUNITY): Payer: Self-pay | Admitting: *Deleted

## 2016-04-11 ENCOUNTER — Encounter (HOSPITAL_COMMUNITY)
Admission: RE | Admit: 2016-04-11 | Discharge: 2016-04-11 | Disposition: A | Discharge status: Home / Self Care | Payer: Medicare Other | Source: Ambulatory Visit | Attending: Internal Medicine | Admitting: Internal Medicine

## 2016-04-11 VITALS — Wt 177.5 lb

## 2016-04-11 DIAGNOSIS — J449 Chronic obstructive pulmonary disease, unspecified: Secondary | ICD-10-CM | POA: Diagnosis not present

## 2016-04-11 NOTE — Progress Notes (Signed)
Pulmonary Individual Treatment Plan  Patient Details  Name: Jeremiah [name] MRN: [id number] Date of Birth: 25-Dec-1945 Referring Provider:   [name] Pulmonary Rehab Walk Test from 12/12/2015 in [place]  Referring Provider  Dr. Melvyn Novas      Initial Encounter Date:  Flowsheet Row Pulmonary Rehab Walk Test from 12/12/2015 in [place]  Date  12/12/15  Referring Provider  Dr. Melvyn Novas      Visit Diagnosis: Chronic obstructive pulmonary disease, unspecified COPD type ([place])  Patient's Home Medications on Admission:   Current Outpatient Prescriptions:  .  albuterol (PROVENTIL HFA;VENTOLIN HFA) 108 (90 BASE) MCG/ACT inhaler, Inhale 2 puffs into the lungs every 4 (four) hours as needed for wheezing or shortness of breath (((PLAN B)))., Disp: 1 Inhaler, Rfl: 5 .  albuterol (PROVENTIL) (2.5 MG/3ML) 0.083% nebulizer solution, Take 3 mLs by nebulization every 4 (four) hours as needed for wheezing or shortness of breath (((PLAN C))). , Disp: , Rfl:  .  ALPRAZolam (XANAX) 0.5 MG tablet, TAKE 1 TABLET BY MOUTH TWICE A DAY AS NEEDED FOR ANXIETY, Disp: 60 tablet, Rfl: 1 .  B-D 3CC LUER-LOK SYR 21GX1-1/2 21G X 1-1/2" 3 ML MISC, USE NEEDLE/SYRINGE TO INJECT TESTOSTERONE INTO SKIN ONCE EVERY 2 WEEKS., Disp: , Rfl: 0 .  benzonatate (TESSALON) 200 MG capsule, Take 1 capsule (200 mg total) by mouth 3 (three) times daily as needed for cough., Disp: 60 capsule, Rfl: 11 .  bisoprolol-hydrochlorothiazide (ZIAC) 5-6.25 MG tablet, Take 1 tablet by mouth daily., Disp: , Rfl:  .  budesonide-formoterol (SYMBICORT) 160-4.5 MCG/ACT inhaler, Inhale 2 puffs into the lungs 2 (two) times daily., Disp: , Rfl:  .  buPROPion (WELLBUTRIN XL) 150 MG 24 hr tablet, Take 150 mg by mouth daily., Disp: , Rfl:  .  cetirizine (ZYRTEC) 10 MG tablet, Take 10 mg by mouth at bedtime. , Disp: , Rfl:  .  Cholecalciferol (VITAMIN D3) 2000 units TABS, Take 1 tablet by mouth every  morning., Disp: , Rfl:  .  dextromethorphan-guaiFENesin (MUCINEX DM) 30-600 MG per 12 hr tablet, Take 89ml every 4 hours as needed with flutter valve for cough and congestion, Disp: , Rfl:  .  esomeprazole (NEXIUM) 40 MG capsule, Take 40 mg by mouth daily before breakfast. , Disp: , Rfl: 2 .  famotidine (PEPCID) 20 MG tablet, Take 20 mg by mouth at bedtime., Disp: , Rfl:  .  fluticasone (FLONASE) 50 MCG/ACT nasal spray, Place 2 sprays into both nostrils 2 (two) times daily., Disp: , Rfl:  .  magnesium oxide (MAG-OX) 400 MG tablet, Take 400 mg by mouth 2 (two) times daily. For leg cramps, Disp: , Rfl:  .  Multiple Vitamin (CALCIUM COMPLEX PO), Take by mouth. Take 3 tablets in am, Disp: , Rfl:  .  naproxen sodium (ANAPROX) 220 MG tablet, Per bottle as needed for joint pain, Disp: , Rfl:  .  Polyvinyl Alcohol (LIQUIFILM TEARS OP), Reported on 09/13/2015, Disp: , Rfl:  .  predniSONE (DELTASONE) 10 MG tablet, Take 4 tabs for 2 days, 3 tabs for 2 days, 2 tabs for 2 days, 1 tab for 2 days, Disp: 20 tablet, Rfl: 0 .  Probiotic Product (PHILLIPS COLON HEALTH) CAPS, Take 1 tablet by mouth daily., Disp: , Rfl:  .  Respiratory Therapy Supplies (FLUTTER) DEVI, Use as directed, Disp: 1 each, Rfl: 0 .  testosterone cypionate (DEPOTESTOSTERONE CYPIONATE) 200 MG/ML injection, INJECT 1 ML (200 MG TOTAL) INTO THE MUSCLE EVERY 14 (  FOURTEEN) DAYS., Disp: , Rfl: 0 .  Tiotropium Bromide Monohydrate (SPIRIVA RESPIMAT) 2.5 MCG/ACT AERS, 2 each am, Disp: 1 Inhaler, Rfl: 11 .  traZODone (DESYREL) 50 MG tablet, Take 1 tablet by mouth at bedtime as needed., Disp: , Rfl:   Past Medical History: Past Medical History:  Diagnosis Date  . COPD (chronic obstructive pulmonary disease) ([place])   . Depression   . Hypertension   . PTSD (post-traumatic stress disorder)   . Sleep apnea     Tobacco Use: History  Smoking Status  . Former Smoker  . Packs/day: 1.00  . Years: 30.00  . Types: Cigarettes  . Quit date: 04/16/1987   Smokeless Tobacco  . Never Used    Labs: Recent Review Flowsheet Data    Labs for ITP Cardiac and Pulmonary Rehab Latest Ref Rng & Units 04/24/2013 04/24/2013 04/25/2013   Trlycerides <150 mg/dL - - 29   PHART 7.350 - 7.450 7.101(LL) 7.175(LL) 7.440   PCO2ART 35.0 - 45.0 mmHg 85.1(HH) 61.4(HH) 30.2(L)   HCO3 20.0 - 24.0 mEq/L 27.4(H) 23.2 20.2   TCO2 0 - 100 mmol/L 30 25 21.1   ACIDBASEDEF 0.0 - 2.0 mmol/L 6.0(H) 7.0(H) 3.3(H)   O2SAT % 100.0 100.0 98.9      Capillary Blood Glucose: Lab Results  Component Value Date   GLUCAP 144 (H) 04/27/2013   GLUCAP 97 04/27/2013   GLUCAP 101 (H) 04/27/2013   GLUCAP 119 (H) 04/27/2013   GLUCAP 130 (H) 04/27/2013     ADL UCSD:   Pulmonary Function Assessment:     Pulmonary Function Assessment - 12/08/15 1035      Breath   Bilateral Breath Sounds Wheezes  mild wheezing heard in upper lobes bilat   Shortness of Breath Yes;Fear of Shortness of Breath;Limiting activity      Exercise Target Goals:    Exercise Program Goal: Individual exercise prescription set with THRR, safety & activity barriers. Participant demonstrates ability to understand and report RPE using BORG scale, to self-measure pulse accurately, and to acknowledge the importance of the exercise prescription.  Exercise Prescription Goal: Starting with aerobic activity 30 plus minutes a day, 3 days per week for initial exercise prescription. Provide home exercise prescription and guidelines that participant acknowledges understanding prior to discharge.  Activity Barriers & Risk Stratification:     Activity Barriers & Cardiac Risk Stratification - 12/08/15 1034      Activity Barriers & Cardiac Risk Stratification   Activity Barriers Shortness of Breath;Deconditioning      6 Minute Walk:     6 Minute Walk    Row Name 12/14/15 0714         6 Minute Walk   Phase Initial     Distance 1275 feet     Walk Time 6 minutes     # of Rest Breaks 0     MPH 2.41      METS 2.84     RPE 11     Perceived Dyspnea  1     Symptoms No     Resting HR 59 bpm     Resting BP 124/80     Max Ex. HR 74 bpm     Max Ex. BP 134/80       Interval HR   Baseline HR 59     1 Minute HR 69     2 Minute HR 73     3 Minute HR 73     4 Minute HR 74  5 Minute HR 74     6 Minute HR 74     2 Minute Post HR 67     Interval Heart Rate? Yes       Interval Oxygen   Interval Oxygen? Yes     Baseline Oxygen Saturation % 95 %     Baseline Liters of Oxygen 0 L     1 Minute Oxygen Saturation % 94 %     1 Minute Liters of Oxygen 0 L     2 Minute Oxygen Saturation % 94 %     2 Minute Liters of Oxygen 0 L     3 Minute Oxygen Saturation % 94 %     3 Minute Liters of Oxygen 0 L     4 Minute Oxygen Saturation % 94 %     4 Minute Liters of Oxygen 0 L     5 Minute Oxygen Saturation % 94 %     5 Minute Liters of Oxygen 0 L     6 Minute Oxygen Saturation % 94 %     6 Minute Liters of Oxygen 0 L     2 Minute Post Oxygen Saturation % 95 %     2 Minute Post Liters of Oxygen 0 L        Initial Exercise Prescription:     Initial Exercise Prescription - 12/14/15 0700      Date of Initial Exercise RX and Referring Provider   Date 12/12/15   Referring Provider Dr. Melvyn Novas     Treadmill   MPH 2   Grade 0   Minutes 17     NuStep   Level 3   Minutes 17   METs 1.5     Rower   Level 2   Minutes 17     Prescription Details   Frequency (times per week) 2   Duration Progress to 45 minutes of aerobic exercise without signs/symptoms of physical distress     Intensity   THRR 40-80% of Max Heartrate 68-122   Ratings of Perceived Exertion 11-13   Perceived Dyspnea 0-4     Progression   Progression Continue progressive overload as per policy without signs/symptoms or physical distress.     Resistance Training   Training Prescription Yes   Weight blue bands   Reps 10-12      Perform Capillary Blood Glucose checks as needed.  Exercise Prescription Changes:      Exercise Prescription Changes    Row Name 12/26/15 1200 12/28/15 1200 01/02/16 1200 01/04/16 1300 01/09/16 1200     Exercise Review   Progression  - Yes  -  - Yes     Response to Exercise   Blood Pressure (Admit) 100/68 100/64 136/74 140/72 120/64   Blood Pressure (Exercise) 122/74 132/88 132/70 124/72 122/66   Blood Pressure (Exit) 100/68 118/60 114/60 110/74 100/60   Heart Rate (Admit) 54 bpm 49 bpm 70 bpm 62 bpm 65 bpm   Heart Rate (Exercise) 67 bpm 59 bpm 80 bpm 71 bpm 70 bpm   Heart Rate (Exit) 59 bpm 55 bpm 68 bpm 63 bpm 71 bpm   Oxygen Saturation (Admit) 95 % 97 % 98 % 95 % 96 %   Oxygen Saturation (Exercise) 92 % 97 % 91 % 95 % 76 %   Oxygen Saturation (Exit) 94 % 94 % 96 % 96 % 71 %   Rating of Perceived Exertion (Exercise) 13 13 15 15 17    Perceived Dyspnea (Exercise)  1 2 3 2 2    Duration Progress to 45 minutes of aerobic exercise without signs/symptoms of physical distress Progress to 45 minutes of aerobic exercise without signs/symptoms of physical distress Progress to 45 minutes of aerobic exercise without signs/symptoms of physical distress Progress to 45 minutes of aerobic exercise without signs/symptoms of physical distress Progress to 45 minutes of aerobic exercise without signs/symptoms of physical distress   Intensity THRR unchanged THRR unchanged THRR unchanged THRR unchanged THRR unchanged     Progression   Progression Continue to progress workloads to maintain intensity without signs/symptoms of physical distress. Continue to progress workloads to maintain intensity without signs/symptoms of physical distress. Continue to progress workloads to maintain intensity without signs/symptoms of physical distress. Continue to progress workloads to maintain intensity without signs/symptoms of physical distress. Continue to progress workloads to maintain intensity without signs/symptoms of physical distress.     Resistance Training   Training Prescription Yes Yes Yes Yes Yes    Weight blue bandas bands bands bands bands   Reps 10-12  10 minutes of strenght training 10-12  10 minutes of strenght training 10-12  10 minutes of strenght training 10-12  10 minutes of strength training 10-12  10 minutes of strength training     Interval Training   Interval Training No No No No No     Treadmill   MPH 2  - 2  - 2.2   Grade 0  - 0  - 2   Minutes 17  - 17  - 17     NuStep   Level 3 4 4 4 4    Minutes 17 17 17 17 17    METs  - 2.6 2.4 3.4 2.7     Rower   Level 2 2 2 2 3    Minutes 17 17 17 17 17      Home Exercise Plan   Plans to continue exercise at  -  -  -  - Home   Frequency  -  -  -  - Add 3 additional days to program exercise sessions.   [place] Name 01/11/16 1200 01/16/16 1604 01/18/16 1200 01/23/16 1200 01/30/16 1200     Response to Exercise   Blood Pressure (Admit) 100/60 120/66 124/70 102/60 140/80   Blood Pressure (Exercise) 118/64 128/64 110/74 116/80 140/84   Blood Pressure (Exit) 102/64 118/68 98/60 106/62 122/82   Heart Rate (Admit) 53 bpm 73 bpm 58 bpm 50 bpm 64 bpm   Heart Rate (Exercise) 66 bpm 86 bpm 67 bpm 74 bpm 86 bpm   Heart Rate (Exit) 61 bpm 77 bpm 56 bpm 63 bpm 69 bpm   Oxygen Saturation (Admit) 94 % 97 % 97 % 95 % 98 %   Oxygen Saturation (Exercise) 95 % 93 % 98 % 94 % 97 %   Oxygen Saturation (Exit) 95 % 97 % 98 % 95 % 97 %   Rating of Perceived Exertion (Exercise) 12 13 12 13 11    Perceived Dyspnea (Exercise) 2 2 1 2 2    Duration Progress to 45 minutes of aerobic exercise without signs/symptoms of physical distress Progress to 45 minutes of aerobic exercise without signs/symptoms of physical distress Progress to 45 minutes of aerobic exercise without signs/symptoms of physical distress Progress to 45 minutes of aerobic exercise without signs/symptoms of physical distress Progress to 45 minutes of aerobic exercise without signs/symptoms of physical distress   Intensity THRR unchanged THRR unchanged THRR unchanged THRR unchanged THRR  unchanged     Progression  Progression Continue to progress workloads to maintain intensity without signs/symptoms of physical distress. Continue to progress workloads to maintain intensity without signs/symptoms of physical distress. Continue to progress workloads to maintain intensity without signs/symptoms of physical distress. Continue to progress workloads to maintain intensity without signs/symptoms of physical distress. Continue to progress workloads to maintain intensity without signs/symptoms of physical distress.     Resistance Training   Training Prescription Yes Yes Yes Yes Yes   Weight bands bands green bands green bands green bands   Reps 10-12  10 minutes of strength training 10-12  10 minutes of strength training 10-12  10 minutes of strength training 10-12  10 minutes of strength training 10-12  10 minutes of strength training     Interval Training   Interval Training No No No No No     Treadmill   MPH 2.2 2.2  - 2.2 2.2   Grade 2 2  - 2 2   Minutes 17 17  - 17 17     NuStep   Level  - 5 5 5 5    Minutes  - 17 17 17 17    METs  - 3.6 4.1 3.4 3.6     Rower   Level 3 3 3 3 3    Minutes 17 17 17 17 17    Row Name 02/01/16 1200 02/06/16 1200 02/08/16 1200 02/13/16 1214 02/15/16 1234     Exercise Review   Progression  - Yes  -  - Yes     Response to Exercise   Blood Pressure (Admit) 104/50 128/74 122/70 132/50 124/80   Blood Pressure (Exercise) 122/70 140/70 110/70 152/70 144/66   Blood Pressure (Exit) 116/56 114/62 124/80 124/70 110/68   Heart Rate (Admit) 65 bpm 62 bpm 75 bpm 70 bpm 56 bpm   Heart Rate (Exercise) 82 bpm 83 bpm 71 bpm 84 bpm 84 bpm   Heart Rate (Exit) 64 bpm 61 bpm 65 bpm 70 bpm 65 bpm   Oxygen Saturation (Admit) 96 % 99 % 94 % 95 % 93 %   Oxygen Saturation (Exercise) 92 % 93 % 94 % 93 % 93 %   Oxygen Saturation (Exit) 97 % 97 % 97 % 95 % 65 %   Rating of Perceived Exertion (Exercise) 11 12 13 13 13    Perceived Dyspnea (Exercise) 1 2 2 1 2     Duration Progress to 45 minutes of aerobic exercise without signs/symptoms of physical distress Progress to 45 minutes of aerobic exercise without signs/symptoms of physical distress Progress to 45 minutes of aerobic exercise without signs/symptoms of physical distress Progress to 45 minutes of aerobic exercise without signs/symptoms of physical distress Progress to 45 minutes of aerobic exercise without signs/symptoms of physical distress   Intensity THRR unchanged THRR unchanged THRR unchanged THRR unchanged THRR unchanged     Progression   Progression Continue to progress workloads to maintain intensity without signs/symptoms of physical distress. Continue to progress workloads to maintain intensity without signs/symptoms of physical distress. Continue to progress workloads to maintain intensity without signs/symptoms of physical distress. Continue to progress workloads to maintain intensity without signs/symptoms of physical distress. Continue to progress workloads to maintain intensity without signs/symptoms of physical distress.     Resistance Training   Training Prescription Yes Yes Yes Yes Yes   Weight green bands BLUE BANDS BLUE BANDS BLUE BANDS BLUE BANDS   Reps 10-12  10 minutes of strength training 10-12  10 minutes of strength training 10-12  10 minutes of  strength training 10-12  10 minutes of strength training 10-12  10 minutes of strength training     Interval Training   Interval Training No No No No No     Treadmill   MPH 2.2 2.5 2.5 2.5  -   Grade 2 3 3 3   -   Minutes 17 17 17 17   -     NuStep   Level 5 5  - 5 5   Minutes 17 17  - 17 17   METs 3.8 2.6  - 4.2 3.6     Rower   Level  - 3 3 3 4    Minutes  - 17 17 17 17    Row Name 02/20/16 1200 02/22/16 1301 02/27/16 1200 02/29/16 1200 03/12/16 1200     Response to Exercise   Blood Pressure (Admit) 140/72 140/70 140/84 150/80 140/72   Blood Pressure (Exercise) 130/62 150/80 154/90 160/94 132/74   Blood Pressure  (Exit) 128/78 134/70 134/80 140/86 122/68   Heart Rate (Admit) 63 bpm 54 bpm 70 bpm 60 bpm 57 bpm   Heart Rate (Exercise) 94 bpm 73 bpm 73 bpm 72 bpm 75 bpm   Heart Rate (Exit) 67 bpm 59 bpm 64 bpm 59 bpm 63 bpm   Oxygen Saturation (Admit) 96 % 96 % 96 % 94 % 93 %   Oxygen Saturation (Exercise) 92 % 93 % 91 % 93 % 90 %   Oxygen Saturation (Exit) 95 % 93 % 91 % 92 % 90 %   Rating of Perceived Exertion (Exercise) 12 12 13 12 12    Perceived Dyspnea (Exercise) 2 2 2 2 2    Duration Progress to 45 minutes of aerobic exercise without signs/symptoms of physical distress Progress to 45 minutes of aerobic exercise without signs/symptoms of physical distress Progress to 45 minutes of aerobic exercise without signs/symptoms of physical distress Progress to 45 minutes of aerobic exercise without signs/symptoms of physical distress Progress to 45 minutes of aerobic exercise without signs/symptoms of physical distress   Intensity THRR unchanged THRR unchanged THRR unchanged THRR unchanged THRR unchanged     Progression   Progression Continue to progress workloads to maintain intensity without signs/symptoms of physical distress. Continue to progress workloads to maintain intensity without signs/symptoms of physical distress. Continue to progress workloads to maintain intensity without signs/symptoms of physical distress. Continue to progress workloads to maintain intensity without signs/symptoms of physical distress. Continue to progress workloads to maintain intensity without signs/symptoms of physical distress.     Resistance Training   Training Prescription Yes Yes Yes Yes Yes   Weight BLUE BANDS BLUE BANDS BLUE BANDS BLUE BANDS BLUE BANDS   Reps 10-12  10 minutes of strength training 10-12  10 minutes of strength training 10-12  10 minutes of strength training 10-12  10 minutes of strength training 10-12  10 minutes of strength training     Interval Training   Interval Training No No No No No      Treadmill   MPH 2.5 2.5 2.5 2.5 2.5   Grade 3 3 3 3 3    Minutes 17 17 17 17 17      NuStep   Level 5  - 5 5 5    Minutes 17  - 17 17 17    METs 3  - 3.2 3.6 3.4     Rower   Level 4 4 4   - 4   Minutes 17 17 17   - 17   Row Name 03/14/16 1200 03/19/16 1213  03/21/16 1200 03/26/16 1200 03/28/16 1200     Exercise Review   Progression  - Yes  -  -  -     Response to Exercise   Blood Pressure (Admit) 138/80 120/70 130/70 144/70 138/80   Blood Pressure (Exercise) 140/80 138/80 150/80 174/104 162/92   Blood Pressure (Exit) 126/66 120/60 132/74 136/80 132/70   Heart Rate (Admit) 71 bpm 72 bpm 63 bpm 74 bpm 68 bpm   Heart Rate (Exercise) 85 bpm 97 bpm 76 bpm 87 bpm 80 bpm   Heart Rate (Exit) 70 bpm 67 bpm 63 bpm 73 bpm 71 bpm   Oxygen Saturation (Admit) 95 % 96 % 96 % 97 % 94 %   Oxygen Saturation (Exercise) 94 % 92 % 94 % 94 % 95 %   Oxygen Saturation (Exit) 96 % 95 % 95 % 97 % 95 %   Rating of Perceived Exertion (Exercise) 12 13 15 12 13    Perceived Dyspnea (Exercise) 2 2 2 2 2    Duration Progress to 45 minutes of aerobic exercise without signs/symptoms of physical distress Progress to 45 minutes of aerobic exercise without signs/symptoms of physical distress Progress to 45 minutes of aerobic exercise without signs/symptoms of physical distress Progress to 45 minutes of aerobic exercise without signs/symptoms of physical distress Progress to 45 minutes of aerobic exercise without signs/symptoms of physical distress   Intensity THRR unchanged THRR unchanged THRR unchanged THRR unchanged THRR unchanged     Progression   Progression Continue to progress workloads to maintain intensity without signs/symptoms of physical distress. Continue to progress workloads to maintain intensity without signs/symptoms of physical distress. Continue to progress workloads to maintain intensity without signs/symptoms of physical distress. Continue to progress workloads to maintain intensity without signs/symptoms of  physical distress. Continue to progress workloads to maintain intensity without signs/symptoms of physical distress.     Resistance Training   Training Prescription Yes Yes Yes Yes Yes   Weight blue bands blue bands blue bands blue bands blue bands   Reps 10-12  10 minutes of strength training 10-12  10 minutes of strength training 10-12  10 minutes of strength training 10-12  10 minutes of strength training 10-12  10 minutes of strength training     Interval Training   Interval Training No No No No No     Treadmill   MPH  - 2.7  - 2.7 2.7   Grade  - 4  - 4 4   Minutes  - 17  - 17 17     NuStep   Level 5 6 6 6   -   Minutes 17 17 17 17   -   METs 4.7 4.8 3.7 4.5  -     Rower   Level 4 4 6 6 6    Minutes 17 17 17 17 17    Row Name 04/02/16 1200 04/04/16 1200           Exercise Review   Progression Yes Yes        Response to Exercise   Blood Pressure (Admit) 120/76 126/72      Blood Pressure (Exercise) 128/80 130/80      Blood Pressure (Exit) 114/76 110/72      Heart Rate (Admit) 60 bpm 68 bpm      Heart Rate (Exercise) 82 bpm 79 bpm      Heart Rate (Exit) 59 bpm 70 bpm      Oxygen Saturation (Admit) 93 % 91 %  Oxygen Saturation (Exercise) 90 % 90 %      Oxygen Saturation (Exit) 91 % 93 %      Rating of Perceived Exertion (Exercise) 13 13      Perceived Dyspnea (Exercise) 3 2      Duration Progress to 45 minutes of aerobic exercise without signs/symptoms of physical distress Progress to 45 minutes of aerobic exercise without signs/symptoms of physical distress      Intensity THRR unchanged THRR unchanged        Progression   Progression Continue to progress workloads to maintain intensity without signs/symptoms of physical distress. Continue to progress workloads to maintain intensity without signs/symptoms of physical distress.        Resistance Training   Training Prescription Yes Yes      Weight blue bands blue bands      Reps 10-12  10 minutes of strength  training 10-12  10 minutes of strength training        Interval Training   Interval Training No No        Treadmill   MPH 2.7 2.9      Grade 4 4      Minutes 17 17        NuStep   Level 7 7      Minutes 17 17      METs 3.5 4.1        Rower   Level 6  -      Minutes 17  -         Exercise Comments:     Exercise Comments    Row Name 01/09/16 1702 01/22/16 0934 02/19/16 1641 03/18/16 1027 04/02/16 0844   Exercise Comments Home exercise completed Patient is progressing well. Only on 8th session-- will cont. to monitor.  Patient states he has only been exercising once a week at home. Encouraged to stick to the Home Exercise Prescription. Patient stated understanding. Will cont. to monitor and progress here at rehab. Patient is slowly progressing in program. I feel that he could put in more effort. Will cont. to motivate and encourage.  Patient is progressing in the program. Will cont. to motivate and encourage.       Discharge Exercise Prescription (Final Exercise Prescription Changes):     Exercise Prescription Changes - 04/04/16 1200      Exercise Review   Progression Yes     Response to Exercise   Blood Pressure (Admit) 126/72   Blood Pressure (Exercise) 130/80   Blood Pressure (Exit) 110/72   Heart Rate (Admit) 68 bpm   Heart Rate (Exercise) 79 bpm   Heart Rate (Exit) 70 bpm   Oxygen Saturation (Admit) 91 %   Oxygen Saturation (Exercise) 90 %   Oxygen Saturation (Exit) 93 %   Rating of Perceived Exertion (Exercise) 13   Perceived Dyspnea (Exercise) 2   Duration Progress to 45 minutes of aerobic exercise without signs/symptoms of physical distress   Intensity THRR unchanged     Progression   Progression Continue to progress workloads to maintain intensity without signs/symptoms of physical distress.     Resistance Training   Training Prescription Yes   Weight blue bands   Reps 10-12  10 minutes of strength training     Interval Training   Interval  Training No     Treadmill   MPH 2.9   Grade 4   Minutes 17     NuStep   Level 7   Minutes 17  METs 4.1       Nutrition:  Target Goals: Understanding of nutrition guidelines, daily intake of sodium 1500mg , cholesterol 200mg , calories 30% from fat and 7% or less from saturated fats, daily to have 5 or more servings of fruits and vegetables.  Biometrics:     Pre Biometrics - 12/08/15 1036      Pre Biometrics   Grip Strength 25 kg       Nutrition Therapy Plan and Nutrition Goals:     Nutrition Therapy & Goals - 12/28/15 1513      Nutrition Therapy   Diet General, Healthful      Personal Nutrition Goals   Personal Goal #1 Maintain wt around 178 lb while in Pulmonary Rehab     Intervention Plan   Intervention Prescribe, educate and counsel regarding individualized specific dietary modifications aiming towards targeted core components such as weight, hypertension, lipid management, diabetes, heart failure and other comorbidities.   Expected Outcomes Short Term Goal: Understand basic principles of dietary content, such as calories, fat, sodium, cholesterol and nutrients.;Long Term Goal: Adherence to prescribed nutrition plan.      Nutrition Discharge: Rate Your Plate Scores:     Nutrition Assessments - 01/18/16 1153      Rate Your Plate Scores   Pre Score 57      Psychosocial: Target Goals: Acknowledge presence or absence of depression, maximize coping skills, provide positive support system. Participant is able to verbalize types and ability to use techniques and skills needed for reducing stress and depression.  Initial Review & Psychosocial Screening:     Initial Psych Review & Screening - 12/08/15 1045      Initial Review   Current issues with Current Stress Concerns   Comments family relationships     Family Dynamics   Good Support System? Yes     Barriers   Psychosocial barriers to participate in program There are no identifiable barriers or  psychosocial needs.     Screening Interventions   Interventions Encouraged to exercise      Quality of Life Scores:   PHQ-9: Recent Review Flowsheet Data    Depression screen [facility] 2/9 12/08/2015 12/28/2014 02/14/2014   Decreased Interest 0 0 0   Down, Depressed, Hopeless 0 0 0   PHQ - 2 Score 0 0 0      Psychosocial Evaluation and Intervention:     Psychosocial Evaluation - 12/08/15 1046      Psychosocial Evaluation & Interventions   Interventions Encouraged to exercise with the program and follow exercise prescription      Psychosocial Re-Evaluation:     Psychosocial Re-Evaluation    Row Name 01/23/16 0746 02/20/16 [id number] 03/19/16 0835         Psychosocial Re-Evaluation   Interventions Encouraged to attend Pulmonary Rehabilitation for the exercise Encouraged to attend Pulmonary Rehabilitation for the exercise Encouraged to attend Pulmonary Rehabilitation for the exercise     Comments there are no psychosocial barriers to participation identified within the past 30 days there are no psychosocial barriers to participation identified within the past 30 days there are no psychosocial barriers to participation identified within the past 30 days       Education: Education Goals: Education classes will be provided on a weekly basis, covering required topics. Participant will state understanding/return demonstration of topics presented.  Learning Barriers/Preferences:     Learning Barriers/Preferences - 12/08/15 1035      Learning Barriers/Preferences   Learning Barriers None   Learning Preferences Computer/Internet;Group  Instruction;Individual Instruction;Verbal Instruction;Written Material      Education Topics: Risk Factor Reduction:  -Group instruction that is supported by a PowerPoint presentation. Instructor discusses the definition of a risk factor, different risk factors for pulmonary disease, and how the heart and lungs work together.     Nutrition for  Pulmonary Patient:  -Group instruction provided by PowerPoint slides, verbal discussion, and written materials to support subject matter. The instructor gives an explanation and review of healthy diet recommendations, which includes a discussion on weight management, recommendations for fruit and vegetable consumption, as well as protein, fluid, caffeine, fiber, sodium, sugar, and alcohol. Tips for eating when patients are short of breath are discussed. Flowsheet Row PULMONARY REHAB CHRONIC OBSTRUCTIVE PULMONARY DISEASE from 04/04/2016 in [place]  Date  02/22/16 [facility] Eating During the [place]  Educator  RD  Instruction Review Code  2- meets goals/outcomes      Pursed Lip Breathing:  -Group instruction that is supported by demonstration and informational handouts. Instructor discusses the benefits of pursed lip and diaphragmatic breathing and detailed demonstration on how to preform both.   Flowsheet Row PULMONARY REHAB CHRONIC OBSTRUCTIVE PULMONARY DISEASE from 04/04/2016 in [place]  Date  04/04/16  Educator  ep  Instruction Review Code  2- meets goals/outcomes      Oxygen Safety:  -Group instruction provided by PowerPoint, verbal discussion, and written material to support subject matter. There is an overview of "What is Oxygen" and "Why do we need it".  Instructor also reviews how to create a safe environment for oxygen use, the importance of using oxygen as prescribed, and the risks of noncompliance. There is a brief discussion on traveling with oxygen and resources the patient may utilize. Flowsheet Row PULMONARY REHAB CHRONIC OBSTRUCTIVE PULMONARY DISEASE from 04/04/2016 in [place]  Date  02/15/16  Educator  RN  Instruction Review Code  2- meets goals/outcomes      Oxygen Equipment:  -Group instruction provided by [facility] Staff utilizing handouts, written materials, and  equipment demonstrations. Flowsheet Row PULMONARY REHAB CHRONIC OBSTRUCTIVE PULMONARY DISEASE from 04/04/2016 in [place]  Date  02/29/16  Educator  rep  Instruction Review Code  2- meets goals/outcomes      Signs and Symptoms:  -Group instruction provided by written material and verbal discussion to support subject matter. Warning signs and symptoms of infection, stroke, and heart attack are reviewed and when to call the physician/911 reinforced. Tips for preventing the spread of infection discussed. Flowsheet Row PULMONARY REHAB CHRONIC OBSTRUCTIVE PULMONARY DISEASE from 04/04/2016 in [place]  Date  03/21/16  Educator  RN  Instruction Review Code  R- Review/reinforce      Advanced Directives:  -Group instruction provided by verbal instruction and written material to support subject matter. Instructor reviews Advanced Directive laws and proper instruction for filling out document.   Pulmonary Video:  -Group video education that reviews the importance of medication and oxygen compliance, exercise, good nutrition, pulmonary hygiene, and pursed lip and diaphragmatic breathing for the pulmonary patient. Flowsheet Row PULMONARY REHAB CHRONIC OBSTRUCTIVE PULMONARY DISEASE from 04/04/2016 in [place]  Date  02/01/16  Instruction Review Code  2- meets goals/outcomes      Exercise for the Pulmonary Patient:  -Group instruction that is supported by a PowerPoint presentation. Instructor discusses benefits of exercise, core components of exercise, frequency, duration, and intensity  of an exercise routine, importance of utilizing pulse oximetry during exercise, safety while exercising, and options of places to exercise outside of rehab.   Flowsheet Row PULMONARY REHAB CHRONIC OBSTRUCTIVE PULMONARY DISEASE from 04/04/2016 in [place]  Date  01/04/16  Educator   EP  Instruction Review Code  R- Review/reinforce      Pulmonary Medications:  -Verbally interactive group education provided by instructor with focus on inhaled medications and proper administration.   Anatomy and Physiology of the Respiratory System and Intimacy:  -Group instruction provided by PowerPoint, verbal discussion, and written material to support subject matter. Instructor reviews respiratory cycle and anatomical components of the respiratory system and their functions. Instructor also reviews differences in obstructive and restrictive respiratory diseases with examples of each. Intimacy, Sex, and Sexuality differences are reviewed with a discussion on how relationships can change when diagnosed with pulmonary disease. Common sexual concerns are reviewed. Flowsheet Row PULMONARY REHAB CHRONIC OBSTRUCTIVE PULMONARY DISEASE from 04/04/2016 in [place]  Date  03/28/16  Educator  RN  Instruction Review Code  2- meets goals/outcomes      Knowledge Questionnaire Score:   Core Components/Risk Factors/Patient Goals at Admission:     Personal Goals and Risk Factors at Admission - 12/08/15 1036      Core Components/Risk Factors/Patient Goals on Admission    Weight Management Weight Loss;Yes   Admit Weight 185 lb (83.9 kg)   Goal Weight: Long Term 170 lb (77.1 kg)   Expected Outcomes Weight Loss: Understanding of general recommendations for a balanced deficit meal plan, which promotes 1-2 lb weight loss per week and includes a negative energy balance of [phone] kcal/d;Understanding recommendations for meals to include 15-35% energy as protein, 25-35% energy from fat, 35-60% energy from carbohydrates, less than 200mg  of dietary cholesterol, 20-35 gm of total fiber daily;Understanding of distribution of calorie intake throughout the day with the consumption of 4-5 meals/snacks;Short Term: Continue to assess and modify interventions until short term  weight is achieved;Long Term: Adherence to nutrition and physical activity/exercise program aimed toward attainment of established weight goal   Increase Strength and Stamina Yes   Intervention Provide advice, education, support and counseling about physical activity/exercise needs.;Develop an individualized exercise prescription for aerobic and resistive training based on initial evaluation findings, risk stratification, comorbidities and participant's personal goals.   Expected Outcomes Achievement of increased cardiorespiratory fitness and enhanced flexibility, muscular endurance and strength shown through measurements of functional capacity and personal statement of participant.   Improve shortness of breath with ADL's Yes   Intervention Provide education, individualized exercise plan and daily activity instruction to help decrease symptoms of SOB with activities of daily living.   Expected Outcomes Short Term: Achieves a reduction of symptoms when performing activities of daily living.   Develop more efficient breathing techniques such as purse lipped breathing and diaphragmatic breathing; and practicing self-pacing with activity Yes   Intervention Provide education, demonstration and support about specific breathing techniuqes utilized for more efficient breathing. Include techniques such as pursed lipped breathing, diaphragmatic breathing and self-pacing activity.   Expected Outcomes Short Term: Participant will be able to demonstrate and use breathing techniques as needed throughout daily activities.   Increase knowledge of respiratory medications and ability to use respiratory devices properly  Yes   Intervention Provide education and demonstration as needed of appropriate use of medications, inhalers, and oxygen therapy.   Expected Outcomes Short Term: Achieves understanding of medications use. Understands that oxygen  is a medication prescribed by physician. Demonstrates appropriate use of  inhaler and oxygen therapy.      Core Components/Risk Factors/Patient Goals Review:      Goals and Risk Factor Review    Row Name 01/18/16 1155 01/23/16 0742 02/20/16 0652 03/19/16 0835       Core Components/Risk Factors/Patient Goals Review   Personal Goals Review Weight Management/Obesity Increase knowledge of respiratory medications and ability to use respiratory devices properly.;Sedentary;Improve shortness of breath with ADL's;Increase Strength and Stamina;Develop more efficient breathing techniques such as purse lipped breathing and diaphragmatic breathing and practicing self-pacing with activity. Increase knowledge of respiratory medications and ability to use respiratory devices properly.;Sedentary;Improve shortness of breath with ADL's;Increase Strength and Stamina;Develop more efficient breathing techniques such as purse lipped breathing and diaphragmatic breathing and practicing self-pacing with activity. Increase knowledge of respiratory medications and ability to use respiratory devices properly.;Sedentary;Improve shortness of breath with ADL's;Increase Strength and Stamina;Develop more efficient breathing techniques such as purse lipped breathing and diaphragmatic breathing and practicing self-pacing with activity.    Review see 01/18/16 RD note see "comments" section on ITP see "comments" section on ITP see "comments" section on ITP    Expected Outcomes Pt to maintain his wt around 178 lb while in Pulmonary Rehab see "Admission" expected outcomes see "Admission" expected outcomes see "Admission" expected outcomes       Core Components/Risk Factors/Patient Goals at Discharge (Final Review):      Goals and Risk Factor Review - 03/19/16 0835      Core Components/Risk Factors/Patient Goals Review   Personal Goals Review Increase knowledge of respiratory medications and ability to use respiratory devices properly.;Sedentary;Improve shortness of breath with ADL's;Increase Strength and  Stamina;Develop more efficient breathing techniques such as purse lipped breathing and diaphragmatic breathing and practicing self-pacing with activity.   Review see "comments" section on ITP   Expected Outcomes see "Admission" expected outcomes      ITP Comments:   Comments: ITP REVIEW Pt is making expected progress toward pulmonary rehab goals after completing 27 sessions. He will graduate within the next 2 weeks. Recommend continued exercise, life style modification, education, and utilization of breathing techniques to increase stamina and strength and decrease shortness of breath with exertion.

## 2016-04-11 NOTE — Progress Notes (Signed)
Daily Session Note  Patient Details  Name: Jeremiah [name] MRN: [id number] Date of Birth: [birth year] Referring Provider:   [name] Pulmonary Rehab Walk Test from 12/12/2015 in [place]  Referring Provider  Dr. Melvyn Novas      Encounter Date: 04/11/2016  Check In:     Session Check In - 04/11/16 1045      Check-In   Location [facility]   Staff Present Ramon Dredge, RN, MHA;Portia Rollene Rotunda, RN, BSN;[name] Ysidro Evert, RN;Asucena Galer Leonia Reeves, RN, BSN   Supervising physician immediately available to respond to emergencies [facility] immediately available   Physician(s) Dr. Wynetta Emery   Medication changes reported     No   Fall or balance concerns reported    No   Warm-up and Cool-down Performed as group-led instruction   Resistance Training Performed Yes   VAD Patient? No     Pain Assessment   Currently in Pain? No/denies   Multiple Pain Sites No      Capillary Blood Glucose: No results found for this or any previous visit (from the past 24 hour(s)).      Exercise Prescription Changes - 04/11/16 1200      Response to Exercise   Blood Pressure (Admit) 112/62   Blood Pressure (Exercise) 150/87   Blood Pressure (Exit) 114/72   Heart Rate (Admit) 58 bpm   Heart Rate (Exercise) 78 bpm   Heart Rate (Exit) 66 bpm   Oxygen Saturation (Admit) 95 %   Oxygen Saturation (Exercise) 92 %   Oxygen Saturation (Exit) 91 %   Rating of Perceived Exertion (Exercise) 11   Perceived Dyspnea (Exercise) 1   Duration Progress to 45 minutes of aerobic exercise without signs/symptoms of physical distress   Intensity THRR unchanged     Progression   Progression Continue to progress workloads to maintain intensity without signs/symptoms of physical distress.     Resistance Training   Training Prescription Yes   Weight blue bands   Reps 10-12  10 minutes of strength training     Interval Training   Interval Training No     Treadmill   MPH  2.9   Grade 4   Minutes 17     Rower   Level 6   Minutes 17     Goals Met:  Exercise tolerated well Strength training completed today  Goals Unmet:  Not Applicable  Comments: Service time is from 1030 to 1215    Dr. Rush Farmer is Medical Director for Pulmonary Rehab at [facility].

## 2016-04-16 ENCOUNTER — Encounter (HOSPITAL_COMMUNITY)
Admission: RE | Admit: 2016-04-16 | Discharge: 2016-04-16 | Disposition: A | Discharge status: Home / Self Care | Payer: Medicare Other | Source: Ambulatory Visit | Attending: Internal Medicine | Admitting: Internal Medicine

## 2016-04-16 ENCOUNTER — Telehealth: Payer: Self-pay | Admitting: Internal Medicine

## 2016-04-16 VITALS — Wt 175.9 lb

## 2016-04-16 DIAGNOSIS — J449 Chronic obstructive pulmonary disease, unspecified: Secondary | ICD-10-CM | POA: Diagnosis not present

## 2016-04-16 DIAGNOSIS — J438 Other emphysema: Secondary | ICD-10-CM

## 2016-04-16 NOTE — Telephone Encounter (Signed)
Called and spoke with Lattie Haw and she stated that the pt is in the undergraduate program and he will move up to the maintenance program but they needed an order. This has been sent in and nothing further is needed.

## 2016-04-16 NOTE — Progress Notes (Signed)
Daily Session Note  Patient Details  Name: Jeremiah [name] MRN: [id number] Date of Birth: 1945/05/04 Referring Provider:   [name] Pulmonary Rehab Walk Test from 12/12/2015 in [place]  Referring Provider  Dr. Melvyn Novas      Encounter Date: 04/16/2016  Check In:     Session Check In - 04/16/16 1025      Check-In   [facility]   Staff Present Rosebud Poles, RN, BSN;Molly diVincenzo, MS, ACSM RCEP, Exercise Physiologist;[name] Ysidro Evert, RN   Supervising physician immediately available to respond to emergencies [facility] immediately available   Physician(s) Dr. Quincy Simmonds   Medication changes reported     No   Fall or balance concerns reported    No   Warm-up and Cool-down Performed as group-led instruction   Resistance Training Performed Yes   VAD Patient? No     Pain Assessment   Currently in Pain? No/denies   Multiple Pain Sites No      Capillary Blood Glucose: No results found for this or any previous visit (from the past 24 hour(s)).      Exercise Prescription Changes - 04/16/16 1200      Response to Exercise   Blood Pressure (Admit) 134/76   Blood Pressure (Exercise) 118/80   Blood Pressure (Exit) 122/70   Heart Rate (Admit) 68 bpm   Heart Rate (Exercise) 84 bpm   Heart Rate (Exit) 67 bpm   Oxygen Saturation (Admit) 96 %   Oxygen Saturation (Exercise) 91 %   Oxygen Saturation (Exit) 98 %   Rating of Perceived Exertion (Exercise) 13   Perceived Dyspnea (Exercise) 2   Duration Progress to 45 minutes of aerobic exercise without signs/symptoms of physical distress   Intensity THRR unchanged     Progression   Progression Continue to progress workloads to maintain intensity without signs/symptoms of physical distress.     Resistance Training   Training Prescription Yes   Weight blue bands   Reps 10-12  10 minutes of strength training     Interval Training   Interval Training No     Treadmill   MPH  2.9   Grade 4   Minutes 17     NuStep   Level 7   Minutes 17   METs 2.9     Rower   Level 6   Minutes 17     Goals Met:  Exercise tolerated well Strength training completed today  Goals Unmet:  Not Applicable  Comments: Service time is from 1030 to 1200    Dr. Rush Farmer is Medical Director for Pulmonary Rehab at [facility].

## 2016-04-18 ENCOUNTER — Encounter (HOSPITAL_COMMUNITY)
Admission: RE | Admit: 2016-04-18 | Discharge: 2016-04-18 | Disposition: A | Discharge status: Home / Self Care | Payer: Medicare Other | Source: Ambulatory Visit | Attending: Internal Medicine | Admitting: Internal Medicine

## 2016-04-18 VITALS — Wt 175.5 lb

## 2016-04-18 DIAGNOSIS — J449 Chronic obstructive pulmonary disease, unspecified: Secondary | ICD-10-CM | POA: Diagnosis not present

## 2016-04-18 DIAGNOSIS — J438 Other emphysema: Secondary | ICD-10-CM

## 2016-04-18 NOTE — Progress Notes (Signed)
Daily Session Note  Patient Details  Name: Jeremiah [name] MRN: [id number] Date of Birth: [birth year] Referring Provider:   [name] Pulmonary Rehab Walk Test from 12/12/2015 in [place]  Referring Provider  Dr. Melvyn Novas      Encounter Date: 04/18/2016  Check In:     Session Check In - 04/18/16 1047      Check-In   Location [facility]   Staff Present Su Hilt, MS, ACSM RCEP, Exercise Physiologist;Portia Rollene Rotunda, RN, BSN;Mattalynn Crandle Ysidro Evert, RN;Joan Leonia Reeves, RN, BSN   Supervising physician immediately available to respond to emergencies [facility] immediately available   Physician(s) [name]   Medication changes reported     No   Fall or balance concerns reported    No   Warm-up and Cool-down Performed as group-led instruction   Resistance Training Performed Yes   VAD Patient? No     Pain Assessment   Currently in Pain? No/denies   Multiple Pain Sites No      Capillary Blood Glucose: No results found for this or any previous visit (from the past 24 hour(s)).      Exercise Prescription Changes - 04/18/16 1200      Response to Exercise   Blood Pressure (Admit) 138/76   Blood Pressure (Exercise) --  walk test today   Blood Pressure (Exit) 130/80   Heart Rate (Admit) 50 bpm   Heart Rate (Exercise) 80 bpm   Heart Rate (Exit) 66 bpm   Oxygen Saturation (Admit) 95 %   Oxygen Saturation (Exercise) 94 %   Oxygen Saturation (Exit) 93 %   Rating of Perceived Exertion (Exercise) 11   Perceived Dyspnea (Exercise) 1   Duration Progress to 45 minutes of aerobic exercise without signs/symptoms of physical distress   Intensity THRR unchanged     Progression   Progression Continue to progress workloads to maintain intensity without signs/symptoms of physical distress.     Resistance Training   Training Prescription Yes   Weight blue bands   Reps 10-12  10 minutes of strength training     Interval Training   Interval  Training No     NuStep   Level 7   Minutes 17   METs 5.2     Goals Met:  Exercise tolerated well No report of cardiac concerns or symptoms Strength training completed today  Goals Unmet:  Not Applicable  Comments: Service time is from 1030 to 1230    Dr. Rush Farmer is Medical Director for Pulmonary Rehab at [facility].

## 2016-04-19 ENCOUNTER — Telehealth: Payer: Self-pay | Admitting: Adult Health

## 2016-04-19 NOTE — Telephone Encounter (Signed)
Spoke with pt. And stated that we currently do not have any samples. Verified with pt. He looked and stated that he takes Breo 100. Informed the pt. That he can try to call us next week to see if we have any samples. He decided not to have a rx sent in just to check back to see if we have some.  Nothing further is needed at this time.    Plan:      #COPD Currently improved and stable copd - gold stage 2 mopderate No evidence of pulmonary fibrosis or caner Unclear why frequent flare ups  PLAN - check alpha 1 genetic test copd  - continue spiriva scheduled  - continue rehab - change symbicort to breo low dose scheduled - empiric change - prevent flare ups - options are roflumilast  V Danrixin COPD Trial - agree that you want to think about both optons             - take product sheet on roflumilast             - take [facility]CF copy on danrixin copd trial - continue acute phase of rehab month 2 currently  #  coronary artery calcification seen on CT             -  please refer to cardiologist - [facility] or Dr Einar Gip, first available  #Followup  - 69months or sooner if needed   Dr. Brand Males, M.D., [facility].C.P Pulmonary and Critical Care Medicine Staff Physician [place] [facility] Pager: [phone], If no answer or between  15:00h - 7:00h: call [phone]  [phone]

## 2016-04-22 ENCOUNTER — Telehealth: Payer: Self-pay | Admitting: Internal Medicine

## 2016-04-22 DIAGNOSIS — I2510 Atherosclerotic heart disease of native coronary artery without angina pectoris: Secondary | ICD-10-CM | POA: Diagnosis not present

## 2016-04-22 DIAGNOSIS — J449 Chronic obstructive pulmonary disease, unspecified: Secondary | ICD-10-CM | POA: Diagnosis not present

## 2016-04-22 DIAGNOSIS — R079 Chest pain, unspecified: Secondary | ICD-10-CM | POA: Diagnosis not present

## 2016-04-22 DIAGNOSIS — I10 Essential (primary) hypertension: Secondary | ICD-10-CM | POA: Diagnosis not present

## 2016-04-22 MED ORDER — FLUTICASONE FUROATE-VILANTEROL 100-25 MCG/INH IN AEPB: 1.0000 | INHALATION_SPRAY | Freq: Every day | RESPIRATORY_TRACT | 5 refills | Status: DC

## 2016-04-22 NOTE — Progress Notes (Signed)
Pulmonary Individual Treatment Plan  Patient Details  Name: Jeremiah [name] MRN: [id number] Date of Birth: Nov 30, 1945 Referring Provider:   Doristine Devoid Pulmonary Rehab Walk Test from 12/12/2015 in [facility] [address]  Referring Provider  Dr. Sherene Sires      Initial Encounter Date:  Flowsheet Row Pulmonary Rehab Walk Test from 12/12/2015 in [facility] CARD[facility]  Date  12/12/15  Referring Provider  Dr. Sherene Sires      Visit Diagnosis: Chronic obstructive pulmonary disease, unspecified COPD type (HCC)  Other emphysema (HCC)  Patient's Home Medications on Admission:   Current Outpatient Prescriptions:  .  albuterol (PROVENTIL HFA;VENTOLIN HFA) 108 (90 BASE) MCG/ACT inhaler, Inhale 2 puffs into the lungs every 4 (four) hours as needed for wheezing or shortness of breath (((PLAN B)))., Disp: 1 Inhaler, Rfl: 5 .  albuterol (PROVENTIL) (2.5 MG/3ML) 0.083% nebulizer solution, Take 3 mLs by nebulization every 4 (four) hours as needed for wheezing or shortness of breath (((PLAN C))). , Disp: , Rfl:  .  ALPRAZolam (XANAX) 0.5 MG tablet, TAKE 1 TABLET BY MOUTH TWICE A DAY AS NEEDED FOR ANXIETY, Disp: 60 tablet, Rfl: 1 .  B-D 3CC LUER-LOK SYR 21GX1-1/2 21G X 1-1/2" 3 ML MISC, USE NEEDLE/SYRINGE TO INJECT TESTOSTERONE INTO SKIN ONCE EVERY 2 WEEKS., Disp: , Rfl: 0 .  benzonatate (TESSALON) 200 MG capsule, Take 1 capsule (200 mg total) by mouth 3 (three) times daily as needed for cough., Disp: 60 capsule, Rfl: 11 .  bisoprolol-hydrochlorothiazide (ZIAC) 5-6.25 MG tablet, Take 1 tablet by mouth daily., Disp: , Rfl:  .  budesonide-formoterol (SYMBICORT) 160-4.5 MCG/ACT inhaler, Inhale 2 puffs into the lungs 2 (two) times daily., Disp: , Rfl:  .  buPROPion (WELLBUTRIN XL) 150 MG 24 hr tablet, Take 150 mg by mouth daily., Disp: , Rfl:  .  cetirizine (ZYRTEC) 10 MG tablet, Take 10 mg by mouth at bedtime. , Disp: , Rfl:  .  Cholecalciferol (VITAMIN D3) 2000 units TABS, Take 1  tablet by mouth every morning., Disp: , Rfl:  .  dextromethorphan-guaiFENesin (MUCINEX DM) 30-600 MG per 12 hr tablet, Take 23ml every 4 hours as needed with flutter valve for cough and congestion, Disp: , Rfl:  .  esomeprazole (NEXIUM) 40 MG capsule, Take 40 mg by mouth daily before breakfast. , Disp: , Rfl: 2 .  famotidine (PEPCID) 20 MG tablet, Take 20 mg by mouth at bedtime., Disp: , Rfl:  .  fluticasone (FLONASE) 50 MCG/ACT nasal spray, Place 2 sprays into both nostrils 2 (two) times daily., Disp: , Rfl:  .  magnesium oxide (MAG-OX) 400 MG tablet, Take 400 mg by mouth 2 (two) times daily. For leg cramps, Disp: , Rfl:  .  Multiple Vitamin (CALCIUM COMPLEX PO), Take by mouth. Take 3 tablets in am, Disp: , Rfl:  .  naproxen sodium (ANAPROX) 220 MG tablet, Per bottle as needed for joint pain, Disp: , Rfl:  .  Polyvinyl Alcohol (LIQUIFILM TEARS OP), Reported on 09/13/2015, Disp: , Rfl:  .  predniSONE (DELTASONE) 10 MG tablet, Take 4 tabs for 2 days, 3 tabs for 2 days, 2 tabs for 2 days, 1 tab for 2 days, Disp: 20 tablet, Rfl: 0 .  Probiotic Product (PHILLIPS COLON HEALTH) CAPS, Take 1 tablet by mouth daily., Disp: , Rfl:  .  Respiratory Therapy Supplies (FLUTTER) DEVI, Use as directed, Disp: 1 each, Rfl: 0 .  testosterone cypionate (DEPOTESTOSTERONE CYPIONATE) 200 MG/ML injection, INJECT 1 ML (200 MG TOTAL) INTO  THE MUSCLE EVERY 14 (FOURTEEN) DAYS., Disp: , Rfl: 0 .  Tiotropium Bromide Monohydrate (SPIRIVA RESPIMAT) 2.5 MCG/ACT AERS, 2 each am, Disp: 1 Inhaler, Rfl: 11 .  traZODone (DESYREL) 50 MG tablet, Take 1 tablet by mouth at bedtime as needed., Disp: , Rfl:   Past Medical History: Past Medical History:  Diagnosis Date  . COPD (chronic obstructive pulmonary disease) ([place])   . Depression   . Hypertension   . PTSD (post-traumatic stress disorder)   . Sleep apnea     Tobacco Use: History  Smoking Status  . Former Smoker  . Packs/day: 1.00  . Years: 30.00  . Types: Cigarettes  . Quit  date: 04/16/1987  Smokeless Tobacco  . Never Used    Labs: Recent Review Flowsheet Data    Labs for ITP Cardiac and Pulmonary Rehab Latest Ref Rng & Units 04/24/2013 04/24/2013 04/25/2013   Trlycerides <150 mg/dL - - 29   PHART 7.350 - 7.450 7.101(LL) 7.175(LL) 7.440   PCO2ART 35.0 - 45.0 mmHg 85.1(HH) 61.4(HH) 30.2(L)   HCO3 20.0 - 24.0 mEq/L 27.4(H) 23.2 20.2   TCO2 0 - 100 mmol/L 30 25 21.1   ACIDBASEDEF 0.0 - 2.0 mmol/L 6.0(H) 7.0(H) 3.3(H)   O2SAT % 100.0 100.0 98.9      Capillary Blood Glucose: Lab Results  Component Value Date   GLUCAP 144 (H) 04/27/2013   GLUCAP 97 04/27/2013   GLUCAP 101 (H) 04/27/2013   GLUCAP 119 (H) 04/27/2013   GLUCAP 130 (H) 04/27/2013     ADL UCSD:     Pulmonary Assessment Scores    Row Name 04/18/16 1356 04/18/16 1408 04/18/16 1414     ADL UCSD   ADL Phase Exit Entry  -   SOB Score total 35 76  -     CAT Score   CAT Score  -  - 13      Pulmonary Function Assessment:     Pulmonary Function Assessment - 12/08/15 1035      Breath   Bilateral Breath Sounds Wheezes  mild wheezing heard in upper lobes bilat   Shortness of Breath Yes;Fear of Shortness of Breath;Limiting activity      Exercise Target Goals:    Exercise Program Goal: Individual exercise prescription set with THRR, safety & activity barriers. Participant demonstrates ability to understand and report RPE using BORG scale, to self-measure pulse accurately, and to acknowledge the importance of the exercise prescription.  Exercise Prescription Goal: Starting with aerobic activity 30 plus minutes a day, 3 days per week for initial exercise prescription. Provide home exercise prescription and guidelines that participant acknowledges understanding prior to discharge.  Activity Barriers & Risk Stratification:     Activity Barriers & Cardiac Risk Stratification - 12/08/15 1034      Activity Barriers & Cardiac Risk Stratification   Activity Barriers Shortness of  Breath;Deconditioning      6 Minute Walk:     6 Minute Walk    Row Name 12/14/15 0714 04/18/16 1343       6 Minute Walk   Phase Initial Discharge    Distance 1275 feet 1842 feet    Walk Time 6 minutes 6 minutes    # of Rest Breaks 0 0    MPH 2.41 3.48    METS 2.84 3.6    RPE 11 11    Perceived Dyspnea  1 1    Symptoms No No    Resting HR 59 bpm 53 bpm    Resting BP 124/80  138/76    Max Ex. HR 74 bpm 87 bpm    Max Ex. BP 134/80 120/80      Interval HR   Baseline HR 59 53    1 Minute HR 69 67    2 Minute HR 73 75    3 Minute HR 73 78    4 Minute HR 74 87    5 Minute HR 74 82    6 Minute HR 74 81    2 Minute Post HR 67 64    Interval Heart Rate? Yes Yes      Interval Oxygen   Interval Oxygen? Yes Yes    Baseline Oxygen Saturation % 95 % 97 %    Baseline Liters of Oxygen 0 L 0 L    1 Minute Oxygen Saturation % 94 % 98 %    1 Minute Liters of Oxygen 0 L 0 L    2 Minute Oxygen Saturation % 94 % 97 %    2 Minute Liters of Oxygen 0 L 0 L    3 Minute Oxygen Saturation % 94 % 94 %    3 Minute Liters of Oxygen 0 L 0 L    4 Minute Oxygen Saturation % 94 % 91 %    4 Minute Liters of Oxygen 0 L 0 L    5 Minute Oxygen Saturation % 94 % 93 %    5 Minute Liters of Oxygen 0 L 0 L    6 Minute Oxygen Saturation % 94 % 94 %    6 Minute Liters of Oxygen 0 L 0 L    2 Minute Post Oxygen Saturation % 95 % 94 %    2 Minute Post Liters of Oxygen 0 L 0 L       Initial Exercise Prescription:     Initial Exercise Prescription - 12/14/15 0700      Date of Initial Exercise RX and Referring Provider   Date 12/12/15   Referring Provider Dr. Melvyn Novas     Treadmill   MPH 2   Grade 0   Minutes 17     NuStep   Level 3   Minutes 17   METs 1.5     Rower   Level 2   Minutes 17     Prescription Details   Frequency (times per week) 2   Duration Progress to 45 minutes of aerobic exercise without signs/symptoms of physical distress     Intensity   THRR 40-80% of Max Heartrate  68-122   Ratings of Perceived Exertion 11-13   Perceived Dyspnea 0-4     Progression   Progression Continue progressive overload as per policy without signs/symptoms or physical distress.     Resistance Training   Training Prescription Yes   Weight blue bands   Reps 10-12      Perform Capillary Blood Glucose checks as needed.  Exercise Prescription Changes:     Exercise Prescription Changes    Row Name 12/26/15 1200 12/28/15 1200 01/02/16 1200 01/04/16 1300 01/09/16 1200     Exercise Review   Progression  - Yes  -  - Yes     Response to Exercise   Blood Pressure (Admit) 100/68 100/64 136/74 140/72 120/64   Blood Pressure (Exercise) 122/74 132/88 132/70 124/72 122/66   Blood Pressure (Exit) 100/68 118/60 114/60 110/74 100/60   Heart Rate (Admit) 54 bpm 49 bpm 70 bpm 62 bpm 65 bpm   Heart Rate (Exercise) 67 bpm 59 bpm  80 bpm 71 bpm 70 bpm   Heart Rate (Exit) 59 bpm 55 bpm 68 bpm 63 bpm 71 bpm   Oxygen Saturation (Admit) 95 % 97 % 98 % 95 % 96 %   Oxygen Saturation (Exercise) 92 % 97 % 91 % 95 % 76 %   Oxygen Saturation (Exit) 94 % 94 % 96 % 96 % 71 %   Rating of Perceived Exertion (Exercise) '13 13 15 15 17   '$ Perceived Dyspnea (Exercise) '1 2 3 2 2   '$ Duration Progress to 45 minutes of aerobic exercise without signs/symptoms of physical distress Progress to 45 minutes of aerobic exercise without signs/symptoms of physical distress Progress to 45 minutes of aerobic exercise without signs/symptoms of physical distress Progress to 45 minutes of aerobic exercise without signs/symptoms of physical distress Progress to 45 minutes of aerobic exercise without signs/symptoms of physical distress   Intensity THRR unchanged THRR unchanged THRR unchanged THRR unchanged THRR unchanged     Progression   Progression Continue to progress workloads to maintain intensity without signs/symptoms of physical distress. Continue to progress workloads to maintain intensity without signs/symptoms of  physical distress. Continue to progress workloads to maintain intensity without signs/symptoms of physical distress. Continue to progress workloads to maintain intensity without signs/symptoms of physical distress. Continue to progress workloads to maintain intensity without signs/symptoms of physical distress.     Resistance Training   Training Prescription Yes Yes Yes Yes Yes   Weight blue bandas bands bands bands bands   Reps 10-12  10 minutes of strenght training 10-12  10 minutes of strenght training 10-12  10 minutes of strenght training 10-12  10 minutes of strength training 10-12  10 minutes of strength training     Interval Training   Interval Training No No No No No     Treadmill   MPH 2  - 2  - 2.2   Grade 0  - 0  - 2   Minutes 17  - 17  - 17     NuStep   Level '3 4 4 4 4   '$ Minutes '17 17 17 17 17   '$ METs  - 2.6 2.4 3.4 2.7     Rower   Level '2 2 2 2 3   '$ Minutes '17 17 17 17 17     '$ Home Exercise Plan   Plans to continue exercise at  -  -  -  - Home   Frequency  -  -  -  - Add 3 additional days to program exercise sessions.   [place] Name 01/11/16 1200 01/16/16 1604 01/18/16 1200 01/23/16 1200 01/30/16 1200     Response to Exercise   Blood Pressure (Admit) 100/60 120/66 124/70 102/60 140/80   Blood Pressure (Exercise) 118/64 128/64 110/74 116/80 140/84   Blood Pressure (Exit) 102/64 118/68 98/60 106/62 122/82   Heart Rate (Admit) 53 bpm 73 bpm 58 bpm 50 bpm 64 bpm   Heart Rate (Exercise) 66 bpm 86 bpm 67 bpm 74 bpm 86 bpm   Heart Rate (Exit) 61 bpm 77 bpm 56 bpm 63 bpm 69 bpm   Oxygen Saturation (Admit) 94 % 97 % 97 % 95 % 98 %   Oxygen Saturation (Exercise) 95 % 93 % 98 % 94 % 97 %   Oxygen Saturation (Exit) 95 % 97 % 98 % 95 % 97 %   Rating of Perceived Exertion (Exercise) '12 13 12 13 11   '$ Perceived Dyspnea (Exercise) '2 2 1 '$ 2  2   Duration Progress to 45 minutes of aerobic exercise without signs/symptoms of physical distress Progress to 45 minutes of aerobic exercise  without signs/symptoms of physical distress Progress to 45 minutes of aerobic exercise without signs/symptoms of physical distress Progress to 45 minutes of aerobic exercise without signs/symptoms of physical distress Progress to 45 minutes of aerobic exercise without signs/symptoms of physical distress   Intensity THRR unchanged THRR unchanged THRR unchanged THRR unchanged THRR unchanged     Progression   Progression Continue to progress workloads to maintain intensity without signs/symptoms of physical distress. Continue to progress workloads to maintain intensity without signs/symptoms of physical distress. Continue to progress workloads to maintain intensity without signs/symptoms of physical distress. Continue to progress workloads to maintain intensity without signs/symptoms of physical distress. Continue to progress workloads to maintain intensity without signs/symptoms of physical distress.     Resistance Training   Training Prescription Yes Yes Yes Yes Yes   Weight bands bands green bands green bands green bands   Reps 10-12  10 minutes of strength training 10-12  10 minutes of strength training 10-12  10 minutes of strength training 10-12  10 minutes of strength training 10-12  10 minutes of strength training     Interval Training   Interval Training No No No No No     Treadmill   MPH 2.2 2.2  - 2.2 2.2   Grade 2 2  - 2 2   Minutes 17 17  - 17 17     NuStep   Level  - '5 5 5 5   '$ Minutes  - '17 17 17 17   '$ METs  - 3.6 4.1 3.4 3.6     Rower   Level '3 3 3 3 3   '$ Minutes '17 17 17 17 17   '$ Row Name 02/01/16 1200 02/06/16 1200 02/08/16 1200 02/13/16 1214 02/15/16 1234     Exercise Review   Progression  - Yes  -  - Yes     Response to Exercise   Blood Pressure (Admit) 104/50 128/74 122/70 132/50 124/80   Blood Pressure (Exercise) 122/70 140/70 110/70 152/70 144/66   Blood Pressure (Exit) 116/56 114/62 124/80 124/70 110/68   Heart Rate (Admit) 65 bpm 62 bpm 75 bpm 70 bpm 56 bpm    Heart Rate (Exercise) 82 bpm 83 bpm 71 bpm 84 bpm 84 bpm   Heart Rate (Exit) 64 bpm 61 bpm 65 bpm 70 bpm 65 bpm   Oxygen Saturation (Admit) 96 % 99 % 94 % 95 % 93 %   Oxygen Saturation (Exercise) 92 % 93 % 94 % 93 % 93 %   Oxygen Saturation (Exit) 97 % 97 % 97 % 95 % 65 %   Rating of Perceived Exertion (Exercise) '11 12 13 13 13   '$ Perceived Dyspnea (Exercise) '1 2 2 1 2   '$ Duration Progress to 45 minutes of aerobic exercise without signs/symptoms of physical distress Progress to 45 minutes of aerobic exercise without signs/symptoms of physical distress Progress to 45 minutes of aerobic exercise without signs/symptoms of physical distress Progress to 45 minutes of aerobic exercise without signs/symptoms of physical distress Progress to 45 minutes of aerobic exercise without signs/symptoms of physical distress   Intensity THRR unchanged THRR unchanged THRR unchanged THRR unchanged THRR unchanged     Progression   Progression Continue to progress workloads to maintain intensity without signs/symptoms of physical distress. Continue to progress workloads to maintain intensity without signs/symptoms of physical distress. Continue  to progress workloads to maintain intensity without signs/symptoms of physical distress. Continue to progress workloads to maintain intensity without signs/symptoms of physical distress. Continue to progress workloads to maintain intensity without signs/symptoms of physical distress.     Resistance Training   Training Prescription Yes Yes Yes Yes Yes   Weight green bands BLUE BANDS BLUE BANDS BLUE BANDS BLUE BANDS   Reps 10-12  10 minutes of strength training 10-12  10 minutes of strength training 10-12  10 minutes of strength training 10-12  10 minutes of strength training 10-12  10 minutes of strength training     Interval Training   Interval Training No No No No No     Treadmill   MPH 2.2 2.5 2.5 2.5  -   Grade '2 3 3 3  '$ -   Minutes '17 17 17 17  '$ -     NuStep    Level 5 5  - 5 5   Minutes 17 17  - 17 17   METs 3.8 2.6  - 4.2 3.6     Rower   Level  - '3 3 3 4   '$ Minutes  - '17 17 17 17   '$ Row Name 02/20/16 1200 02/22/16 1301 02/27/16 1200 02/29/16 1200 03/12/16 1200     Response to Exercise   Blood Pressure (Admit) 140/72 140/70 140/84 150/80 140/72   Blood Pressure (Exercise) 130/62 150/80 154/90 160/94 132/74   Blood Pressure (Exit) 128/78 134/70 134/80 140/86 122/68   Heart Rate (Admit) 63 bpm 54 bpm 70 bpm 60 bpm 57 bpm   Heart Rate (Exercise) 94 bpm 73 bpm 73 bpm 72 bpm 75 bpm   Heart Rate (Exit) 67 bpm 59 bpm 64 bpm 59 bpm 63 bpm   Oxygen Saturation (Admit) 96 % 96 % 96 % 94 % 93 %   Oxygen Saturation (Exercise) 92 % 93 % 91 % 93 % 90 %   Oxygen Saturation (Exit) 95 % 93 % 91 % 92 % 90 %   Rating of Perceived Exertion (Exercise) '12 12 13 12 12   '$ Perceived Dyspnea (Exercise) '2 2 2 2 2   '$ Duration Progress to 45 minutes of aerobic exercise without signs/symptoms of physical distress Progress to 45 minutes of aerobic exercise without signs/symptoms of physical distress Progress to 45 minutes of aerobic exercise without signs/symptoms of physical distress Progress to 45 minutes of aerobic exercise without signs/symptoms of physical distress Progress to 45 minutes of aerobic exercise without signs/symptoms of physical distress   Intensity THRR unchanged THRR unchanged THRR unchanged THRR unchanged THRR unchanged     Progression   Progression Continue to progress workloads to maintain intensity without signs/symptoms of physical distress. Continue to progress workloads to maintain intensity without signs/symptoms of physical distress. Continue to progress workloads to maintain intensity without signs/symptoms of physical distress. Continue to progress workloads to maintain intensity without signs/symptoms of physical distress. Continue to progress workloads to maintain intensity without signs/symptoms of physical distress.     Resistance Training    Training Prescription Yes Yes Yes Yes Yes   Weight BLUE BANDS BLUE BANDS BLUE BANDS BLUE BANDS BLUE BANDS   Reps 10-12  10 minutes of strength training 10-12  10 minutes of strength training 10-12  10 minutes of strength training 10-12  10 minutes of strength training 10-12  10 minutes of strength training     Interval Training   Interval Training No No No No No     Treadmill  MPH 2.5 2.5 2.5 2.5 2.5   Grade '3 3 3 3 3   '$ Minutes '17 17 17 17 17     '$ NuStep   Level 5  - '5 5 5   '$ Minutes 17  - '17 17 17   '$ METs 3  - 3.2 3.6 3.4     Rower   Level '4 4 4  '$ - 4   Minutes '17 17 17  '$ - 17   Row Name 03/14/16 1200 03/19/16 1213 03/21/16 1200 03/26/16 1200 03/28/16 1200     Exercise Review   Progression  - Yes  -  -  -     Response to Exercise   Blood Pressure (Admit) 138/80 120/70 130/70 144/70 138/80   Blood Pressure (Exercise) 140/80 138/80 150/80 174/104 162/92   Blood Pressure (Exit) 126/66 120/60 132/74 136/80 132/70   Heart Rate (Admit) 71 bpm 72 bpm 63 bpm 74 bpm 68 bpm   Heart Rate (Exercise) 85 bpm 97 bpm 76 bpm 87 bpm 80 bpm   Heart Rate (Exit) 70 bpm 67 bpm 63 bpm 73 bpm 71 bpm   Oxygen Saturation (Admit) 95 % 96 % 96 % 97 % 94 %   Oxygen Saturation (Exercise) 94 % 92 % 94 % 94 % 95 %   Oxygen Saturation (Exit) 96 % 95 % 95 % 97 % 95 %   Rating of Perceived Exertion (Exercise) '12 13 15 12 13   '$ Perceived Dyspnea (Exercise) '2 2 2 2 2   '$ Duration Progress to 45 minutes of aerobic exercise without signs/symptoms of physical distress Progress to 45 minutes of aerobic exercise without signs/symptoms of physical distress Progress to 45 minutes of aerobic exercise without signs/symptoms of physical distress Progress to 45 minutes of aerobic exercise without signs/symptoms of physical distress Progress to 45 minutes of aerobic exercise without signs/symptoms of physical distress   Intensity THRR unchanged THRR unchanged THRR unchanged THRR unchanged THRR unchanged     Progression    Progression Continue to progress workloads to maintain intensity without signs/symptoms of physical distress. Continue to progress workloads to maintain intensity without signs/symptoms of physical distress. Continue to progress workloads to maintain intensity without signs/symptoms of physical distress. Continue to progress workloads to maintain intensity without signs/symptoms of physical distress. Continue to progress workloads to maintain intensity without signs/symptoms of physical distress.     Resistance Training   Training Prescription Yes Yes Yes Yes Yes   Weight blue bands blue bands blue bands blue bands blue bands   Reps 10-12  10 minutes of strength training 10-12  10 minutes of strength training 10-12  10 minutes of strength training 10-12  10 minutes of strength training 10-12  10 minutes of strength training     Interval Training   Interval Training No No No No No     Treadmill   MPH  - 2.7  - 2.7 2.7   Grade  - 4  - 4 4   Minutes  - 17  - 17 17     NuStep   Level '5 6 6 6  '$ -   Minutes '17 17 17 17  '$ -   METs 4.7 4.8 3.7 4.5  -     Rower   Level '4 4 6 6 6   '$ Minutes '17 17 17 17 17   '$ Row Name 04/02/16 1200 04/04/16 1200 04/11/16 1200 04/16/16 1200 04/18/16 1200     Exercise Review   Progression Yes Yes  -  -  -  Response to Exercise   Blood Pressure (Admit) 120/76 126/72 112/62 134/76 138/76   Blood Pressure (Exercise) 128/80 130/80 150/87 118/80 -  walk test today   Blood Pressure (Exit) 114/76 110/72 114/72 122/70 130/80   Heart Rate (Admit) 60 bpm 68 bpm 58 bpm 68 bpm 50 bpm   Heart Rate (Exercise) 82 bpm 79 bpm 78 bpm 84 bpm 80 bpm   Heart Rate (Exit) 59 bpm 70 bpm 66 bpm 67 bpm 66 bpm   Oxygen Saturation (Admit) 93 % 91 % 95 % 96 % 95 %   Oxygen Saturation (Exercise) 90 % 90 % 92 % 91 % 94 %   Oxygen Saturation (Exit) 91 % 93 % 91 % 98 % 93 %   Rating of Perceived Exertion (Exercise) '13 13 11 13 11   '$ Perceived Dyspnea (Exercise) '3 2 1 2 1   '$ Duration  Progress to 45 minutes of aerobic exercise without signs/symptoms of physical distress Progress to 45 minutes of aerobic exercise without signs/symptoms of physical distress Progress to 45 minutes of aerobic exercise without signs/symptoms of physical distress Progress to 45 minutes of aerobic exercise without signs/symptoms of physical distress Progress to 45 minutes of aerobic exercise without signs/symptoms of physical distress   Intensity THRR unchanged THRR unchanged THRR unchanged THRR unchanged THRR unchanged     Progression   Progression Continue to progress workloads to maintain intensity without signs/symptoms of physical distress. Continue to progress workloads to maintain intensity without signs/symptoms of physical distress. Continue to progress workloads to maintain intensity without signs/symptoms of physical distress. Continue to progress workloads to maintain intensity without signs/symptoms of physical distress. Continue to progress workloads to maintain intensity without signs/symptoms of physical distress.     Resistance Training   Training Prescription Yes Yes Yes Yes Yes   Weight blue bands blue bands blue bands blue bands blue bands   Reps 10-12  10 minutes of strength training 10-12  10 minutes of strength training 10-12  10 minutes of strength training 10-12  10 minutes of strength training 10-12  10 minutes of strength training     Interval Training   Interval Training No No No No No     Treadmill   MPH 2.7 2.9 2.9 2.9  -   Grade '4 4 4 4  '$ -   Minutes '17 17 17 17  '$ -     NuStep   Level 7 7  - 7 7   Minutes 17 17  - 17 17   METs 3.5 4.1  - 2.9 5.2     Rower   Level 6  - 6 6  -   Minutes 17  - 17 17  -      Exercise Comments:     Exercise Comments    Row Name 01/09/16 1702 01/22/16 0934 02/19/16 1641 03/18/16 1027 04/02/16 0844   Exercise Comments Home exercise completed Patient is progressing well. Only on 8th session-- will cont. to monitor.  Patient  states he has only been exercising once a week at home. Encouraged to stick to the Home Exercise Prescription. Patient stated understanding. Will cont. to monitor and progress here at rehab. Patient is slowly progressing in program. I feel that he could put in more effort. Will cont. to motivate and encourage.  Patient is progressing in the program. Will cont. to motivate and encourage.    Row Name 04/16/16 1211           Exercise Comments Patient will  be graduating 04/18/16. Patient will cont. to exercise at [facility] Pulmonary Rehab Maintenance 2 days a week.          Discharge Exercise Prescription (Final Exercise Prescription Changes):     Exercise Prescription Changes - 04/18/16 1200      Response to Exercise   Blood Pressure (Admit) 138/76   Blood Pressure (Exercise) --  walk test today   Blood Pressure (Exit) 130/80   Heart Rate (Admit) 50 bpm   Heart Rate (Exercise) 80 bpm   Heart Rate (Exit) 66 bpm   Oxygen Saturation (Admit) 95 %   Oxygen Saturation (Exercise) 94 %   Oxygen Saturation (Exit) 93 %   Rating of Perceived Exertion (Exercise) 11   Perceived Dyspnea (Exercise) 1   Duration Progress to 45 minutes of aerobic exercise without signs/symptoms of physical distress   Intensity THRR unchanged     Progression   Progression Continue to progress workloads to maintain intensity without signs/symptoms of physical distress.     Resistance Training   Training Prescription Yes   Weight blue bands   Reps 10-12  10 minutes of strength training     Interval Training   Interval Training No     NuStep   Level 7   Minutes 17   METs 5.2       Nutrition:  Target Goals: Understanding of nutrition guidelines, daily intake of sodium '1500mg'$ , cholesterol '200mg'$ , calories 30% from fat and 7% or less from saturated fats, daily to have 5 or more servings of fruits and vegetables.  Biometrics:     Pre Biometrics - 12/08/15 1036      Pre Biometrics   Grip Strength 25 kg        Nutrition Therapy Plan and Nutrition Goals:     Nutrition Therapy & Goals - 12/28/15 1513      Nutrition Therapy   Diet General, Healthful      Personal Nutrition Goals   Personal Goal #1 Maintain wt around 178 lb while in Pulmonary Rehab     Intervention Plan   Intervention Prescribe, educate and counsel regarding individualized specific dietary modifications aiming towards targeted core components such as weight, hypertension, lipid management, diabetes, heart failure and other comorbidities.   Expected Outcomes Short Term Goal: Understand basic principles of dietary content, such as calories, fat, sodium, cholesterol and nutrients.;Long Term Goal: Adherence to prescribed nutrition plan.      Nutrition Discharge: Rate Your Plate Scores:     Nutrition Assessments - 01/18/16 1153      Rate Your Plate Scores   Pre Score 57      Psychosocial: Target Goals: Acknowledge presence or absence of depression, maximize coping skills, provide positive support system. Participant is able to verbalize types and ability to use techniques and skills needed for reducing stress and depression.  Initial Review & Psychosocial Screening:     Initial Psych Review & Screening - 12/08/15 1045      Initial Review   Current issues with Current Stress Concerns   Comments family relationships     Family Dynamics   Good Support System? Yes     Barriers   Psychosocial barriers to participate in program There are no identifiable barriers or psychosocial needs.     Screening Interventions   Interventions Encouraged to exercise      Quality of Life Scores:     Quality of Life - 04/18/16 1409      Quality of Life Scores   Health/Function  Pre 16.8 %   Health/Function Post 59.73 %   Health/Function % Change 255.54 %   Socioeconomic Pre 16.67 %   Socioeconomic Post 23.81 %   Socioeconomic % Change  42.83 %   Psych/Spiritual Pre 21.79 %   Psych/Spiritual Post 19.86 %    Psych/Spiritual % Change -8.86 %   Family Pre 21 %   Family Post 24 %   Family % Change 14.29 %   GLOBAL Pre 18.24 %   GLOBAL Post 38.87 %   GLOBAL % Change 113.1 %      PHQ-9: Recent Review Flowsheet Data    Depression screen [facility] 2/9 04/18/2016 12/08/2015 12/28/2014 02/14/2014   Decreased Interest - 0 0 0   Down, Depressed, Hopeless 0 0 0 0   PHQ - 2 Score 0 0 0 0      Psychosocial Evaluation and Intervention:     Psychosocial Evaluation - 04/22/16 0837      Discharge Psychosocial Assessment & Intervention   Comments there are no psychosocial barriers to continuing his home exercise identified at discharge      Psychosocial Re-Evaluation:     Psychosocial Re-Evaluation    Cloud Name 01/23/16 0746 02/20/16 6644 03/19/16 0835         Psychosocial Re-Evaluation   Interventions Encouraged to attend Pulmonary Rehabilitation for the exercise Encouraged to attend Pulmonary Rehabilitation for the exercise Encouraged to attend Pulmonary Rehabilitation for the exercise     Comments there are no psychosocial barriers to participation identified within the past 30 days there are no psychosocial barriers to participation identified within the past 30 days there are no psychosocial barriers to participation identified within the past 30 days       Education: Education Goals: Education classes will be provided on a weekly basis, covering required topics. Participant will state understanding/return demonstration of topics presented.  Learning Barriers/Preferences:     Learning Barriers/Preferences - 12/08/15 1035      Learning Barriers/Preferences   Learning Barriers None   Learning Preferences Computer/Internet;Group Instruction;Individual Instruction;Verbal Instruction;Written Material      Education Topics: Risk Factor Reduction:  -Group instruction that is supported by a PowerPoint presentation. Instructor discusses the definition of a risk factor, different risk factors for  pulmonary disease, and how the heart and lungs work together.     Nutrition for Pulmonary Patient:  -Group instruction provided by PowerPoint slides, verbal discussion, and written materials to support subject matter. The instructor gives an explanation and review of healthy diet recommendations, which includes a discussion on weight management, recommendations for fruit and vegetable consumption, as well as protein, fluid, caffeine, fiber, sodium, sugar, and alcohol. Tips for eating when patients are short of breath are discussed. Flowsheet Row PULMONARY REHAB CHRONIC OBSTRUCTIVE PULMONARY DISEASE from 04/18/2016 in [place]  Date  02/22/16 [facility] Eating During the [place]  Educator  RD  Instruction Review Code  2- meets goals/outcomes      Pursed Lip Breathing:  -Group instruction that is supported by demonstration and informational handouts. Instructor discusses the benefits of pursed lip and diaphragmatic breathing and detailed demonstration on how to preform both.   Flowsheet Row PULMONARY REHAB CHRONIC OBSTRUCTIVE PULMONARY DISEASE from 04/18/2016 in [place]  Date  04/04/16  Educator  ep  Instruction Review Code  2- meets goals/outcomes      Oxygen Safety:  -Group instruction provided by PowerPoint, verbal discussion, and written material to support subject matter. There is  an overview of "What is Oxygen" and "Why do we need it".  Instructor also reviews how to create a safe environment for oxygen use, the importance of using oxygen as prescribed, and the risks of noncompliance. There is a brief discussion on traveling with oxygen and resources the patient may utilize. Flowsheet Row PULMONARY REHAB CHRONIC OBSTRUCTIVE PULMONARY DISEASE from 04/18/2016 in [place]  Date  02/15/16  Educator  RN  Instruction Review Code  2- meets goals/outcomes      Oxygen Equipment:  -Group  instruction provided by [facility] Staff utilizing handouts, written materials, and equipment demonstrations. Flowsheet Row PULMONARY REHAB CHRONIC OBSTRUCTIVE PULMONARY DISEASE from 04/18/2016 in [place]  Date  04/18/16  Educator  [employer]  Instruction Review Code  2- meets goals/outcomes      Signs and Symptoms:  -Group instruction provided by written material and verbal discussion to support subject matter. Warning signs and symptoms of infection, stroke, and heart attack are reviewed and when to call the physician/911 reinforced. Tips for preventing the spread of infection discussed. Flowsheet Row PULMONARY REHAB CHRONIC OBSTRUCTIVE PULMONARY DISEASE from 04/18/2016 in [place]  Date  03/21/16  Educator  RN  Instruction Review Code  R- Review/reinforce      Advanced Directives:  -Group instruction provided by verbal instruction and written material to support subject matter. Instructor reviews Advanced Directive laws and proper instruction for filling out document.   Pulmonary Video:  -Group video education that reviews the importance of medication and oxygen compliance, exercise, good nutrition, pulmonary hygiene, and pursed lip and diaphragmatic breathing for the pulmonary patient. Flowsheet Row PULMONARY REHAB CHRONIC OBSTRUCTIVE PULMONARY DISEASE from 04/18/2016 in [place]  Date  02/01/16  Instruction Review Code  2- meets goals/outcomes      Exercise for the Pulmonary Patient:  -Group instruction that is supported by a PowerPoint presentation. Instructor discusses benefits of exercise, core components of exercise, frequency, duration, and intensity of an exercise routine, importance of utilizing pulse oximetry during exercise, safety while exercising, and options of places to exercise outside of rehab.   Flowsheet Row PULMONARY REHAB CHRONIC OBSTRUCTIVE PULMONARY DISEASE from  04/18/2016 in [employer]  Date  01/04/16  Educator  EP  Instruction Review Code  R- Review/reinforce      Pulmonary Medications:  -Verbally interactive group education provided by instructor with focus on inhaled medications and proper administration.   Anatomy and Physiology of the Respiratory System and Intimacy:  -Group instruction provided by PowerPoint, verbal discussion, and written material to support subject matter. Instructor reviews respiratory cycle and anatomical components of the respiratory system and their functions. Instructor also reviews differences in obstructive and restrictive respiratory diseases with examples of each. Intimacy, Sex, and Sexuality differences are reviewed with a discussion on how relationships can change when diagnosed with pulmonary disease. Common sexual concerns are reviewed. Flowsheet Row PULMONARY REHAB CHRONIC OBSTRUCTIVE PULMONARY DISEASE from 04/18/2016 in [place]  Date  03/28/16  Educator  RN  Instruction Review Code  2- meets goals/outcomes      Knowledge Questionnaire Score:     Knowledge Questionnaire Score - 04/18/16 1408      Knowledge Questionnaire Score   Pre Score 10/13      Core Components/Risk Factors/Patient Goals at Admission:     Personal Goals and Risk Factors at Admission - 12/08/15 1036  Core Components/Risk Factors/Patient Goals on Admission    Weight Management Weight Loss;Yes   Admit Weight 185 lb (83.9 kg)   Goal Weight: Long Term 170 lb (77.1 kg)   Expected Outcomes Weight Loss: Understanding of general recommendations for a balanced deficit meal plan, which promotes 1-2 lb weight loss per week and includes a negative energy balance of [phone] kcal/d;Understanding recommendations for meals to include 15-35% energy as protein, 25-35% energy from fat, 35-60% energy from carbohydrates, less than '200mg'$  of dietary cholesterol, 20-35 gm of total  fiber daily;Understanding of distribution of calorie intake throughout the day with the consumption of 4-5 meals/snacks;Short Term: Continue to assess and modify interventions until short term weight is achieved;Long Term: Adherence to nutrition and physical activity/exercise program aimed toward attainment of established weight goal   Increase Strength and Stamina Yes   Intervention Provide advice, education, support and counseling about physical activity/exercise needs.;Develop an individualized exercise prescription for aerobic and resistive training based on initial evaluation findings, risk stratification, comorbidities and participant's personal goals.   Expected Outcomes Achievement of increased cardiorespiratory fitness and enhanced flexibility, muscular endurance and strength shown through measurements of functional capacity and personal statement of participant.   Improve shortness of breath with ADL's Yes   Intervention Provide education, individualized exercise plan and daily activity instruction to help decrease symptoms of SOB with activities of daily living.   Expected Outcomes Short Term: Achieves a reduction of symptoms when performing activities of daily living.   Develop more efficient breathing techniques such as purse lipped breathing and diaphragmatic breathing; and practicing self-pacing with activity Yes   Intervention Provide education, demonstration and support about specific breathing techniuqes utilized for more efficient breathing. Include techniques such as pursed lipped breathing, diaphragmatic breathing and self-pacing activity.   Expected Outcomes Short Term: Participant will be able to demonstrate and use breathing techniques as needed throughout daily activities.   Increase knowledge of respiratory medications and ability to use respiratory devices properly  Yes   Intervention Provide education and demonstration as needed of appropriate use of medications, inhalers, and  oxygen therapy.   Expected Outcomes Short Term: Achieves understanding of medications use. Understands that oxygen is a medication prescribed by physician. Demonstrates appropriate use of inhaler and oxygen therapy.      Core Components/Risk Factors/Patient Goals Review:      Goals and Risk Factor Review    Row Name 01/18/16 1155 01/23/16 0742 02/20/16 0652 03/19/16 0835 04/22/16 0836     Core Components/Risk Factors/Patient Goals Review   Personal Goals Review Weight Management/Obesity Increase knowledge of respiratory medications and ability to use respiratory devices properly.;Sedentary;Improve shortness of breath with ADL's;Increase Strength and Stamina;Develop more efficient breathing techniques such as purse lipped breathing and diaphragmatic breathing and practicing self-pacing with activity. Increase knowledge of respiratory medications and ability to use respiratory devices properly.;Sedentary;Improve shortness of breath with ADL's;Increase Strength and Stamina;Develop more efficient breathing techniques such as purse lipped breathing and diaphragmatic breathing and practicing self-pacing with activity. Increase knowledge of respiratory medications and ability to use respiratory devices properly.;Sedentary;Improve shortness of breath with ADL's;Increase Strength and Stamina;Develop more efficient breathing techniques such as purse lipped breathing and diaphragmatic breathing and practicing self-pacing with activity. Increase knowledge of respiratory medications and ability to use respiratory devices properly.;Sedentary;Improve shortness of breath with ADL's;Increase Strength and Stamina;Develop more efficient breathing techniques such as purse lipped breathing and diaphragmatic breathing and practicing self-pacing with activity.   Review see 01/18/16 RD note see "comments" section on ITP see "comments"  section on ITP see "comments" section on ITP see "comments" section on ITP   Expected  Outcomes Pt to maintain his wt around 178 lb while in Pulmonary Rehab see "Admission" expected outcomes see "Admission" expected outcomes see "Admission" expected outcomes see "Admission" expected outcomes      Core Components/Risk Factors/Patient Goals at Discharge (Final Review):      Goals and Risk Factor Review - 04/22/16 0836      Core Components/Risk Factors/Patient Goals Review   Personal Goals Review Increase knowledge of respiratory medications and ability to use respiratory devices properly.;Sedentary;Improve shortness of breath with ADL's;Increase Strength and Stamina;Develop more efficient breathing techniques such as purse lipped breathing and diaphragmatic breathing and practicing self-pacing with activity.   Review see "comments" section on ITP   Expected Outcomes see "Admission" expected outcomes      ITP Comments:   Comments: Mr. Tessler has met all of his pulmonary rehab goals after completing 24 exercise/education sessions. He plans to continue his exercise program post discharge by enrolling in our pulmonary rehab maintenance program. At discharge, there are no psychosocial barriers identified that would prevent him for continuing to exercise.

## 2016-04-22 NOTE — Telephone Encounter (Signed)
Call pt wife @ [phone].Jeremiah [name]

## 2016-04-22 NOTE — Telephone Encounter (Signed)
Rx for Breo refilled  Called and left spouse detailed msg informing her that this was done

## 2016-04-23 ENCOUNTER — Encounter (HOSPITAL_COMMUNITY): Admission: RE | Admit: 2016-04-23 | Payer: Medicare Other | Source: Ambulatory Visit

## 2016-04-25 ENCOUNTER — Encounter (HOSPITAL_COMMUNITY): Payer: Medicare Other

## 2016-04-29 ENCOUNTER — Encounter: Payer: Self-pay | Admitting: Internal Medicine

## 2016-04-29 DIAGNOSIS — R0789 Other chest pain: Secondary | ICD-10-CM | POA: Diagnosis not present

## 2016-04-30 ENCOUNTER — Encounter (HOSPITAL_COMMUNITY): Payer: Medicare Other

## 2016-04-30 ENCOUNTER — Encounter (HOSPITAL_COMMUNITY)
Admission: RE | Admit: 2016-04-30 | Discharge: 2016-04-30 | Disposition: A | Discharge status: Home / Self Care | Payer: Self-pay | Source: Ambulatory Visit | Attending: Internal Medicine | Admitting: Internal Medicine

## 2016-04-30 DIAGNOSIS — J449 Chronic obstructive pulmonary disease, unspecified: Secondary | ICD-10-CM | POA: Insufficient documentation

## 2016-04-30 NOTE — Progress Notes (Signed)
Jeremiah [name] started the pulmonary rehab maintenance program today.  He exercised for 45 minutes and was slightly hypertensive with his blood pressure 140/90 and a recheck while walking on the track of 150/100.  Upon exit his blood pressure was 138/78.  He did experienced  some shortness of breath while exercising as well.  His oxygen saturations were in the 90 % or greater.

## 2016-05-02 ENCOUNTER — Encounter (HOSPITAL_COMMUNITY): Payer: Medicare Other

## 2016-05-07 ENCOUNTER — Encounter (HOSPITAL_COMMUNITY)
Admission: RE | Admit: 2016-05-07 | Discharge: 2016-05-07 | Disposition: A | Discharge status: Home / Self Care | Payer: Self-pay | Source: Ambulatory Visit | Attending: Internal Medicine | Admitting: Internal Medicine

## 2016-05-07 ENCOUNTER — Encounter (HOSPITAL_COMMUNITY): Payer: Medicare Other

## 2016-05-08 DIAGNOSIS — R079 Chest pain, unspecified: Secondary | ICD-10-CM | POA: Diagnosis not present

## 2016-05-09 ENCOUNTER — Encounter (HOSPITAL_COMMUNITY)
Admission: RE | Admit: 2016-05-09 | Discharge: 2016-05-09 | Disposition: A | Discharge status: Home / Self Care | Payer: Self-pay | Source: Ambulatory Visit | Attending: Internal Medicine | Admitting: Internal Medicine

## 2016-05-14 ENCOUNTER — Encounter (HOSPITAL_COMMUNITY)
Admission: RE | Admit: 2016-05-14 | Discharge: 2016-05-14 | Disposition: A | Discharge status: Home / Self Care | Payer: Self-pay | Source: Ambulatory Visit | Attending: Internal Medicine | Admitting: Internal Medicine

## 2016-05-14 DIAGNOSIS — I2510 Atherosclerotic heart disease of native coronary artery without angina pectoris: Secondary | ICD-10-CM | POA: Diagnosis not present

## 2016-05-14 DIAGNOSIS — I10 Essential (primary) hypertension: Secondary | ICD-10-CM | POA: Diagnosis not present

## 2016-05-14 DIAGNOSIS — E7800 Pure hypercholesterolemia, unspecified: Secondary | ICD-10-CM | POA: Diagnosis not present

## 2016-05-14 DIAGNOSIS — R079 Chest pain, unspecified: Secondary | ICD-10-CM | POA: Diagnosis not present

## 2016-05-16 ENCOUNTER — Encounter (HOSPITAL_COMMUNITY)
Admission: RE | Admit: 2016-05-16 | Discharge: 2016-05-16 | Disposition: A | Discharge status: Home / Self Care | Payer: Self-pay | Source: Ambulatory Visit | Attending: Internal Medicine | Admitting: Internal Medicine

## 2016-05-16 DIAGNOSIS — J449 Chronic obstructive pulmonary disease, unspecified: Secondary | ICD-10-CM | POA: Insufficient documentation

## 2016-05-17 ENCOUNTER — Ambulatory Visit (INDEPENDENT_AMBULATORY_CARE_PROVIDER_SITE_OTHER): Payer: Medicare Other | Admitting: Internal Medicine

## 2016-05-17 ENCOUNTER — Encounter: Payer: Self-pay | Admitting: Internal Medicine

## 2016-05-17 VITALS — BP 114/70 | HR 55 | Ht 72.0 in | Wt 175.0 lb

## 2016-05-17 DIAGNOSIS — J449 Chronic obstructive pulmonary disease, unspecified: Secondary | ICD-10-CM

## 2016-05-17 DIAGNOSIS — R053 Chronic cough: Secondary | ICD-10-CM

## 2016-05-17 DIAGNOSIS — R05 Cough: Secondary | ICD-10-CM

## 2016-05-17 MED ORDER — SODIUM CHLORIDE 3 % IN NEBU: INHALATION_SOLUTION | RESPIRATORY_TRACT | 12 refills | Status: DC | PRN

## 2016-05-17 MED ORDER — SODIUM CHLORIDE 3 % IN NEBU: INHALATION_SOLUTION | Freq: Every day | RESPIRATORY_TRACT | 12 refills | Status: DC

## 2016-05-17 NOTE — Patient Instructions (Addendum)
ICD-9-CM ICD-10-CM   1. Stage 2 moderate COPD by GOLD classification ([place]) 496 J44.9   2. Chronic cough 786.2 R05     #COPD Currently  stable copd - gold stage 2 mopderate No evidence of pulmonary fibrosis or caner Unclear why frequent flare ups Uncler why out of proportion cough though breo helping Unclear why cough is prednisone responsive I do not think you have copd flare up  PLAN -- continue spiriva scheduled  - continue rehab - continue  breo low dose  - hold off danirixin study for now per your request - Do CT sinus without contrast -= start 3% saline nebs 11mL once daily - get it at [facility] - nurse to order portable nebulizer    Followup 4-6 weeks to assess progress

## 2016-05-17 NOTE — Progress Notes (Signed)
Subjective:     Patient ID: Jeremiah [name], male   DOB: [birth year], 71 y.o.   MRN: [id number]  HPI Brief patient profile:  70 yobm quit smoking 1989 with no problems with variable sob x 2004 and maintained on inhalers referred 05/03/2013 by Dr Titus Mould for evaluation of outpt management for copd after hospitalization and found to have GOLD III COPD 01/2014  with tendency to panic when sob.  Admit date: 04/24/2013  Discharge date: 04/27/2013  Discharge Diagnoses:  Active Problems:  COPD exacerbation  Detailed Hospital Course:  71 yo male with abrupt onset of sob (1/10) that progressively worsened until EMS called. Patient intubated in ED due to severe respiratory distress. In route, patient had increasing tachypnea, decline SpO2, and obtundation. Patient with GCS <8 at arrival, evidence of emesis, intubated for severe respiratory distress. Family reported no evidence of fevers, chills, malaise, worsening coughing, and sob. Initial episode passed, however sister received a call later with him reporting a return of symptoms, complaints of inability of breathing and EMS alerted. Distant history of tobacco abuse. Has never been hospitalized for COPD before this episode. He has had no recent hospitalizations, and has never been previously intubated.  He was admitted to the ICU on 1/11 and was treated with the typical modalities for acute respiratory failure (for presumed AECODP) which included mechanical ventilation with sedation, inhaled bronchodilators, antibiotics, and systemic steroid therapy. He improved later that day and was able to be extubated and sedation was stopped. His care since that point has primarily focused on the de-escalation of the therapies mentioned above. On 1/13 he has returned to a near-baseline state of health and has been deemed a candidate for discharge.  Discharge Plan by diagnoses  Acute exacerbation of COPD  Acute hypercarbic respiratory failure (resolved)  plan  -Follow up  with Dr. Melvyn Novas on 05/03/2013 at 2:30PM  -Change steroids to PO Prednisone, taper over 7-10d  -Resume home dose BDs    History of Present Illness  05/03/2013 1st [facility] office visit/ Wert cc more sob since Oct 2014 on symbicort 160 2 bid and spiriva daily  But still  daily use of proventil and no need for neb then needed neb x sev months prior to his admit above but back to baseline since d/c on pred still tapering off but using saba qid, not prn "out of habit" >>return for PFT   01/03/2014 Follow up  [name] .  Complains of increased episodes of increased DOE, wheezing, prod cough with white/clear mucus worse for last week. Family member says he has been progressively get worse over last year. Was admitted 04/2013 w/ COPD flare /VDRF . [name] from last office visit however did not follow up .   Followed at [facility] , says he was told he has Moderate COPD 6 yrs ago. On Symbicort and Spiriva.  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  Says he gets winded easily esp on incline.  Says worse for last week with increased cough and wheezing.  No hemotpysis, wt loss, calf pain, n/v/d, chest pain or fever.  Last abx ~2 months ago.  rec Doxycycline 100mg  Twice daily  For 7 days  Prednisone taper over next week Mucinex DM Twice daily  As needed  Cough /congestion  Continue on Symbicort and Spiriva .    01/24/2014 f/u ov/Wert re:  GOLD III  Chief Complaint  Patient presents with  . Follow-up    PFT done today. Pt states that SOB and cough have improved some, but not back to normal baseline. Cough is prod with minimal clear sputum.   can do HT but no mall due to sob Prednisone really helped a lot but did not maintain improvement once finished >>pred taper add ppi bid and diet     12/12/2014 acute  ov/Wert re: ? aecopd /new gi/gu complaints ? From spiriva?   Chief Complaint   Patient presents with  . Acute Visit    Pt c/o increased SOB and cough x 4 days. Cough is not very prod- clear sputum. He also c/o increased gas and bloating.   downhill since prev ov, changing his own maint rx (dc'd pepcid) >  severe cough at hs onset 12/09/14  On spiriva lots of gi/gu issues  Started pred one day prior to OV  As per calendar action plan  Has only used saba so far in hfa form, has neb but doesn't feel he needs it yet.  >changed spiriva to [place]     04/04/2015  f/u ov/Wert re: copd  Chief Complaint  Patient presents with  . Follow-up    pt following for COPD: pt states hes been its been on and off. pt states 3 weekws ago he was diagnosed with bronchitits but he feels much better. pt c/o a dry cough thats been bothering him mostly in the night time. pt states at times he has to take the CPAP off because he has these couhing fits. no c/o SOB , wheezing, or chest tightness.   has med calendar but not following the action plans at the bottom for cough  > For cough/ congestion > mucinex dm and flutter valve       07/20/2015  f/u ov/Wert re: aecopd/ using med calendar though not flutter or approp dose of mucinex dm/ confused with when to use pred  Chief Complaint  Patient presents with  . Follow-up    pt c/o nonprod cough, chest congestion, sob X4-5 days.  S/s worse qhs.    last prednisone finished on 07/16/15 and getting worse again in terms of breathing and dry coughing rec In the event that you need your nebulizer more than rarely for your breathing: prednisone 20 mg daily until better  Then 10 mg daily x 5 days and stop For cough > mucinex dm 1200 mg every 12 hours and use the flutter valve Work on inhaler technique    08/29/2015  f/u ov/Wert re:  GOLD III  symbicort 160 2bid/ tudorza  Chief Complaint  Patient presents with  . Follow-up    Cough has not improved since the last visit. He uses ventolin at least once per day and neb with albuterol 2 x daily on  average.   Still using oil based vit D, following med calendar better / rarely taking prednisone but helps when does  Lots of hoarseness and dry cough  Day >   >stop [place] and begin Spiriva     11/01/2015 NP  Acute OV  Presents for an acute office visit Patient complains of 2 weeks of increased cough, shortness of breath and wheezing. Feels that the high temperatures and humidity or making his breathing worse. Wears out with minimum activity   Denies any chest congestion/tightness, sinus pressure/drainage, fever, nausea or vomiting.  Has any fever or discolored mucus. Remains on Symbicort and Spiriva. Did start on prednisone over the last  1-2 days. Still has persistent cough and wheezing. Cough is keeping him up at night.. Patient and wife have multiple questions about his severity of his underlying COPD. He went over purse lip breathing. He would like to try pulmonary rehabilitation again. rec Zpack to have on hold if symtpoms worsen with discolored mucus.  Prednisone taper over next week.  Hydromet 1 tsp every 6hr, As needed  Cough , may make sleepy.  Refer to pulmonary rehab    12/25/2015  f/u ov/Wert re: GOLD III copd/ symb/spiriva - using med calendar well  Chief Complaint  Patient presents with  . Follow-up    Doing well and denies any co's today.   doe [facility] = can't walk a nl pace on a flat grade s sob but does fine slow and flat eg  WM shopping ok p using HC parking - rare saba need -has not needed pred recently main complaint is abd distention/ constipation  rec Try spiriva just one puff each am to see if bloating/constipation improve  and if so leave the dose just at one pff - if not ok try off completely after a few weeks of one pff but if off it makes no difference at all then just restart it at 2 each am as per med calendar  See calendar for specific medication instructions    01/17/2016 acute extended ov/Wert re: GOLD III/ aecopd  Chief Complaint  Patient presents  with  . Acute Visit    Pt c/o cough x 5 days- prod with min clear sputum.  He had nosebleed last night.    severe cough onset 10/1 > min white mucus, worse at hs  Confused with names of meds and correlating them with med calendar though did bring it with him  Did not previously disclose use of tessalon but says now he was using it prn and worked fine until it ran out  Note note now on tenoretic, also not on med calendar  rec For dry cough as per med calendar > tessalon pearls 200 mg every 4-6 hours as needed and use the flutter valve For congested cough > mucinex dm up to 1200  Every 12 hours and use the flutter valve also  Increase the spiriva back to 2 puffs each am  See calendar for specific medication instructions   01/25/2016 acute extended ov/Wert re: copd gold III/ chronic dry cough  Chief Complaint  Patient presents with  . Acute Visit    Pt. c/o coughing on going  for couple of months, frequent coughing spills, some clear mucus, some sob, some wheezing, some chest pain after having coughing spells   improving ex tol at rehab s 02 Cough is worse around 3 am nightly x months but no excess mucus/ just dry hack ? Really using pepcid at hs as per med calendar  Has pred to take for flare of sob but not cough so has not tried it yet  >>pred taper , changed atenolol to bisoprolol    02/12/2016 Acute OV : COPD  He says he is feeling some better but has not gotten over his cough yet.  He started on tessalon last ov which is helping some.  Seen 2 weeks ago , given prednisone taper and change from atenolol to bisoprolol.  Chest xray last ov with copd changes , no acute process.  He denies chest pain , orthopnea, edema or fever.  Cough is mainly dry , sometimes worse at night .  Him and his  wife are worried why the cough does not go away .     OV 03/22/2016  Chief Complaint  Patient presents with  . Follow-up    Changing from MW to MR. Pt here after PFT. Pt c/o dry cough and SOB  at baseline. Pt denies CP/tightness and f/c/s.     71 year old male presents with his wife. They've been married for 2 years. In 2015 he had significant life-threatening COPD exacerbation. Since then been on triple inhaler therapy. Currently stable. He is doing pulmonary rehabilitation maintenance phase of the second time. Overall he tells me that his major complains of moderate degree of cough and shortness of breath and wheezing. The cough is essentially dry. At pulmonary rehabilitation he does not desaturate. He has been doing this for one month. He'll finish pulmonary rehabilitation and another one month. Wife's main concern is that he's having frequent COPD exacerbations. According to her at least 5 times he's had COPD exacerbation requiring prednisone and antibiotic of both in the year 2017. Most recently was 2 weeks ago. Emotions trigger it. The house does not have any pets or bad carpet or mold or mildew or cockroach.  He underwent Pulmi function testing today and compared to 2015 he is actually improved to go stage II COPD. Previously was Gold stage III COPD. He had high resolution CT chest that shows coronary artery calcification for which I do not see any evidence of cardiac workup. He has emphysema but no evidence of interstitial lung disease or cancer.  Currently he does well. It is noted that he is on testosterone shots  OV 05/17/2016  Chief Complaint  Patient presents with  . Follow-up    2 mo f/u. Breathing has been the same last visit. Denies any SOB    Follow-up Gold stage II COPD. He presents with his wife. In the past used to be Gold stage III COPD but when we repeated pulmonary function testing is probably function test that actually improved to Gold stage II in December 2017 [see below]. His main issues that he has severe chronic cough. They're also worried about recurrent COPD exacerbations. In reviewing the chart and in talking to him it appears that the cough is severe and  rated as a score of 4/5 .and is present all the time associated with thick white mucus. He also has some dyspnea. His cough is so significant that he has made some phone calls about it and he gets treated for COPD exacerbation with prednisone and antibiotic which actually helped his cough. Therefore he believes that he has recurrent COPD exacerbations. But on taking the history closely this cough is stable and is still at a level IV/V. He is frustrated by it. He denies it is clearing of the throat although in the office he is cleared his throat. His wife is wondering about other etiologies be on COPD this contributing to this cough. There is associated dyspnea that is significant. Immediate appears that these are all unchanged compared to his last visit in December 2017. But they're definitely bothering him. He did tell me that the Brio helped him. Of note he does have some postnasal drainage and review of the chart suggests that he's never had sinus CT  Of note at this time he is not interested in either roflumilast or partipation in aecopd prevention research trial    CAT COPD Symptom & Quality of Life Score ([employer] trademark) 0 is no burden. 5 is highest burden 03/22/2016   Never Cough ->  Cough all the time 4  No phlegm in chest -> Chest is full of phlegm 1  No chest tightness -> Chest feels very tight 1  No dyspnea for 1 flight stairs/hill -> Very dyspneic for 1 flight of stairs 4  No limitations for ADL at home -> Very limited with ADL at home 4  Confident leaving home -> Not at all confident leaving home 0  Sleep soundly -> Do not sleep soundly because of lung condition 0  Lots of Energy -> No energy at all 3  TOTAL Score (max 40)  17      IMPRESSION: 1. No evidence of interstitial lung disease. 2. Moderate centrilobular emphysema and mild diffuse bronchial wall thickening, consistent with the provided history of COPD. 3. Small parenchymal bands in the posterior basilar bilateral  lower lobes, most consistent with mild postinfectious/postinflammatory scarring. 4. Aortic atherosclerosis. Ectatic ascending thoracic aorta, maximum diameter 4.1 cm. Recommend annual imaging followup by CTA or MRA. This recommendation follows 2010 ACCF/AHA/AATS/ACR/ASA/SCA/SCAI/SIR/STS/SVM Guidelines for the Diagnosis and Management of Patients with Thoracic Aortic Disease. Circulation. 2010; 121: [id number]. 5. Three-vessel coronary atherosclerosis.   Electronically Signed   By: Ilona Sorrel M.D.   On: 02/20/2016 13:59    Results for Jeremiah [name], Jeremiah [name] (MRN [id number]) as of 03/22/2016 11:11  Ref. Range 01/24/2014 10:51 03/22/2016 09:59  FVC-Post Latest Units: L 2.75 3.53  FVC-%Pred-Post Latest Units: % 56 84  FVC-%Change-Post Latest Units: % 3 5  FEV1-Post Latest Units: L 1.26 1.79  FEV1-%Pred-Post Latest Units: % 34 56  Results for Jeremiah [name], Jeremiah [name] (MRN [id number]) as of 03/22/2016 11:11  Ref. Range 01/24/2014 10:51  DLCO unc Latest Units: ml/min/mmHg 17.16  DLCO unc % pred Latest Units: % 48      has a past medical history of COPD (chronic obstructive pulmonary disease) ([place]); Depression; Hypertension; PTSD (post-traumatic stress disorder); and Sleep apnea.   reports that he quit smoking about 29 years ago. His smoking use included Cigarettes. He has a 30.00 pack-year smoking history. He has never used smokeless tobacco.  Past Surgical History:  Procedure Laterality Date  . HAMMER TOE SURGERY  April 2017    No Known Allergies  Immunization History  Administered Date(s) Administered  . Influenza Split 01/13/2013  . Influenza, High Dose Seasonal PF 12/28/2014, 01/15/2016  . Influenza,inj,Quad PF,36+ Mos 01/24/2014  . Pneumococcal-Unspecified 04/15/2010  . Tdap 12/28/2014    Family History  Problem Relation Age of Onset  . Asthma Maternal Grandmother   . Heart disease Father   . Dementia Mother      Current Outpatient Prescriptions:  .  albuterol (PROVENTIL  HFA;VENTOLIN HFA) 108 (90 BASE) MCG/ACT inhaler, Inhale 2 puffs into the lungs every 4 (four) hours as needed for wheezing or shortness of breath (((PLAN B)))., Disp: 1 Inhaler, Rfl: 5 .  albuterol (PROVENTIL) (2.5 MG/3ML) 0.083% nebulizer solution, Take 3 mLs by nebulization every 4 (four) hours as needed for wheezing or shortness of breath (((PLAN C))). , Disp: , Rfl:  .  ALPRAZolam (XANAX) 0.5 MG tablet, TAKE 1 TABLET BY MOUTH TWICE A DAY AS NEEDED FOR ANXIETY, Disp: 60 tablet, Rfl: 1 .  atorvastatin (LIPITOR) 20 MG tablet, Take 20 mg by mouth daily., Disp: , Rfl:  .  B-D 3CC LUER-LOK SYR 21GX1-1/2 21G X 1-1/2" 3 ML MISC, USE NEEDLE/SYRINGE TO INJECT TESTOSTERONE INTO SKIN ONCE EVERY 2 WEEKS., Disp: , Rfl: 0 .  benzonatate (TESSALON) 200 MG capsule, Take 1 capsule (200 mg total) by mouth 3 (three) times  daily as needed for cough., Disp: 60 capsule, Rfl: 11 .  bisoprolol-hydrochlorothiazide (ZIAC) 5-6.25 MG tablet, Take 1 tablet by mouth daily., Disp: , Rfl:  .  buPROPion (WELLBUTRIN XL) 150 MG 24 hr tablet, Take 150 mg by mouth daily., Disp: , Rfl:  .  cetirizine (ZYRTEC) 10 MG tablet, Take 10 mg by mouth at bedtime. , Disp: , Rfl:  .  Cholecalciferol (VITAMIN D3) 2000 units TABS, Take 1 tablet by mouth every morning., Disp: , Rfl:  .  dextromethorphan-guaiFENesin (MUCINEX DM) 30-600 MG per 12 hr tablet, Take 33ml every 4 hours as needed with flutter valve for cough and congestion, Disp: , Rfl:  .  esomeprazole (NEXIUM) 40 MG capsule, Take 40 mg by mouth daily before breakfast. , Disp: , Rfl: 2 .  famotidine (PEPCID) 20 MG tablet, Take 20 mg by mouth at bedtime., Disp: , Rfl:  .  fluticasone (FLONASE) 50 MCG/ACT nasal spray, Place 2 sprays into both nostrils 2 (two) times daily., Disp: , Rfl:  .  fluticasone furoate-vilanterol (BREO ELLIPTA) 100-25 MCG/INH AEPB, Inhale 1 puff into the lungs daily., Disp: 30 each, Rfl: 5 .  magnesium oxide (MAG-OX) 400 MG tablet, Take 400 mg by mouth 2 (two) times  daily. For leg cramps, Disp: , Rfl:  .  Multiple Vitamin (CALCIUM COMPLEX PO), Take by mouth. Take 3 tablets in am, Disp: , Rfl:  .  naproxen sodium (ANAPROX) 220 MG tablet, Per bottle as needed for joint pain, Disp: , Rfl:  .  Polyvinyl Alcohol (LIQUIFILM TEARS OP), Reported on 09/13/2015, Disp: , Rfl:  .  Probiotic Product (PHILLIPS COLON HEALTH) CAPS, Take 1 tablet by mouth daily., Disp: , Rfl:  .  Respiratory Therapy Supplies (FLUTTER) DEVI, Use as directed, Disp: 1 each, Rfl: 0 .  testosterone cypionate (DEPOTESTOSTERONE CYPIONATE) 200 MG/ML injection, INJECT 1 ML (200 MG TOTAL) INTO THE MUSCLE EVERY 14 (FOURTEEN) DAYS., Disp: , Rfl: 0 .  Tiotropium Bromide Monohydrate (SPIRIVA RESPIMAT) 2.5 MCG/ACT AERS, 2 each am, Disp: 1 Inhaler, Rfl: 11 .  traZODone (DESYREL) 50 MG tablet, Take 1 tablet by mouth at bedtime as needed., Disp: , Rfl:    Review of Systems     Objective:   Physical Exam  Constitutional: He is oriented to person, place, and time. He appears well-developed and well-nourished. No distress.  HENT:  Head: Normocephalic and atraumatic.  Right Ear: External ear normal.  Left Ear: External ear normal.  Mouth/Throat: Oropharynx is clear and moist. No oropharyngeal exudate.  Eyes: Conjunctivae and EOM are normal. Pupils are equal, round, and reactive to light. Right eye exhibits no discharge. Left eye exhibits no discharge. No scleral icterus.  Neck: Normal range of motion. Neck supple. No JVD present. No tracheal deviation present. No thyromegaly present.  Cardiovascular: Normal rate, regular rhythm and intact distal pulses.  Exam reveals no gallop and no friction rub.   No murmur heard. Pulmonary/Chest: Effort normal and breath sounds normal. No respiratory distress. He has no wheezes. He has no rales. He exhibits no tenderness.  He is really surprised that it did not hear any wheezing  Abdominal: Soft. Bowel sounds are normal. He exhibits no distension and no mass. There is  no tenderness. There is no rebound and no guarding.  Musculoskeletal: Normal range of motion. He exhibits no edema or tenderness.  Lymphadenopathy:    He has no cervical adenopathy.  Neurological: He is alert and oriented to person, place, and time. He has normal reflexes. No cranial nerve  deficit. Coordination normal.  Skin: Skin is warm and dry. No rash noted. He is not diaphoretic. No erythema. No pallor.  Psychiatric: He has a normal mood and affect. His behavior is normal. Judgment and thought content normal.  Nursing note and vitals reviewed.   Vitals:   05/17/16 1221  BP: 114/70  Pulse: (!) 55  SpO2: 95%  Weight: 175 lb (79.4 kg)  Height: 6' (1.829 m)   Estimated body mass index is 23.73 kg/m as calculated from the following:   Height as of this encounter: 6' (1.829 m).   Weight as of this encounter: 175 lb (79.4 kg).      Assessment:       ICD-9-CM ICD-10-CM   1. Stage 2 moderate COPD by GOLD classification ([place]) 496 J44.9 Ambulatory Referral for DME  2. Chronic cough 786.2 R05 CT MAXILLOFACIAL WO CONTRAST     Ambulatory Referral for DME       Plan:      #COPD Currently  stable copd - gold stage 2 mopderate No evidence of pulmonary fibrosis or caner Unclear why frequent flare ups Uncler why out of proportion cough though breo helping Unclear why cough is prednisone responsive I do not think you have copd flare up  PLAN -- continue spiriva scheduled  - continue rehab - continue  breo low dose  - hold off danirixin study for now per your request - Do CT sinus without contrast -= start 3% saline nebs 88mL once daily - get it at [facility] - nurse to order portable nebulizer    Followup 4-6 weeks to assess progress   > 50% of this > 25 min visit spent in face to face counseling or coordination of care   Dr. Brand Males, M.D., [facility].C.P Pulmonary and Critical Care Medicine Staff Physician [facility] Pager: [phone], If no answer or between  15:00h - 7:00h: call [phone]  05/17/2016 2:30 PM

## 2016-05-20 ENCOUNTER — Telehealth: Payer: Self-pay | Admitting: Internal Medicine

## 2016-05-20 MED ORDER — SODIUM CHLORIDE 3 % IN NEBU: INHALATION_SOLUTION | Freq: Every day | RESPIRATORY_TRACT | 12 refills | Status: DC

## 2016-05-20 NOTE — Telephone Encounter (Signed)
MR please advise if the CT order needs to be changed to CT sinus instead of the order that was placed for CT maxiofacial.  Please advise. thanks

## 2016-05-20 NOTE — Telephone Encounter (Signed)
Spoke with Stacy at [facility]. She is aware of MR's response. States that she can change this order. Nothing further was needed.

## 2016-05-20 NOTE — Telephone Encounter (Signed)
stacey returning call a/b orders.Hillery Hunter

## 2016-05-20 NOTE — Telephone Encounter (Addendum)
lmtcb for [employer].   Also, spoke with [facility]. Pt's Hypertonic neb solution is not available through DME and is only available through [facility], this rx was sent. LMTCB for pt.

## 2016-05-20 NOTE — Telephone Encounter (Signed)
Yes change to ct sinus  Dr. Brand Males, M.D., [facility]C.P Pulmonary and Critical Care Medicine Staff Physician [facility] [facility] Pager: [phone], If no answer or between  15:00h - 7:00h: call [phone]  05/20/2016 4:36 PM

## 2016-05-21 ENCOUNTER — Encounter (HOSPITAL_COMMUNITY)
Admission: RE | Admit: 2016-05-21 | Discharge: 2016-05-21 | Disposition: A | Discharge status: Home / Self Care | Payer: Self-pay | Source: Ambulatory Visit | Attending: Internal Medicine | Admitting: Internal Medicine

## 2016-05-21 DIAGNOSIS — F4312 Post-traumatic stress disorder, chronic: Secondary | ICD-10-CM | POA: Diagnosis not present

## 2016-05-21 DIAGNOSIS — F411 Generalized anxiety disorder: Secondary | ICD-10-CM | POA: Diagnosis not present

## 2016-05-21 DIAGNOSIS — F331 Major depressive disorder, recurrent, moderate: Secondary | ICD-10-CM | POA: Diagnosis not present

## 2016-05-22 ENCOUNTER — Telehealth: Payer: Self-pay | Admitting: Internal Medicine

## 2016-05-22 MED ORDER — SODIUM CHLORIDE 3 % IN NEBU: INHALATION_SOLUTION | Freq: Every day | RESPIRATORY_TRACT | 12 refills | Status: DC

## 2016-05-22 NOTE — Telephone Encounter (Signed)
Pt aware that Rx has been sent to [facility]. Pt also aware that [facility] should be contacting him in regards to delivering neb machine. I have advised pt to contact us, if pt has not tried from [facility] by Friday. Nothing further needed.

## 2016-05-22 NOTE — Telephone Encounter (Signed)
Patient states [facility] does not have RX as of yesterday.  Then the patient's wife got on the phone and states they were told it would be cheaper to get the solution from where the nebulizer comes from.

## 2016-05-23 ENCOUNTER — Ambulatory Visit (INDEPENDENT_AMBULATORY_CARE_PROVIDER_SITE_OTHER)
Admission: RE | Admit: 2016-05-23 | Discharge: 2016-05-23 | Disposition: A | Discharge status: Home / Self Care | Payer: Medicare Other | Source: Ambulatory Visit | Attending: Internal Medicine | Admitting: Internal Medicine

## 2016-05-23 ENCOUNTER — Telehealth: Payer: Self-pay | Admitting: Internal Medicine

## 2016-05-23 ENCOUNTER — Encounter (HOSPITAL_COMMUNITY)
Admission: RE | Admit: 2016-05-23 | Discharge: 2016-05-23 | Disposition: A | Discharge status: Home / Self Care | Payer: Self-pay | Source: Ambulatory Visit | Attending: Internal Medicine | Admitting: Internal Medicine

## 2016-05-23 DIAGNOSIS — R053 Chronic cough: Secondary | ICD-10-CM

## 2016-05-23 DIAGNOSIS — R05 Cough: Secondary | ICD-10-CM

## 2016-05-23 DIAGNOSIS — J349 Unspecified disorder of nose and nasal sinuses: Secondary | ICD-10-CM

## 2016-05-23 NOTE — Telephone Encounter (Signed)
lmtcb for pt.  

## 2016-05-23 NOTE — Telephone Encounter (Signed)
  Jeremiah [name]  Please tell [name] that he has some sinus diseaes. Doubt is something ENT will do surgically but given recurrent AECOPD and chronic cough best ENT checks him out. Please refer to ENT  Thanks  Dr. Brand Males, M.D., [facility].C.P Pulmonary and Critical Care Medicine Staff Physician [place] [facility] Pager: [phone], If no answer or between  15:00h - 7:00h: call [phone]  05/23/2016 3:34 PM     [place] [facility]  Result Date: 05/23/2016 CLINICAL DATA:  Chronic cough for 3-4 months EXAM: CT PARANASAL SINUS LIMITED WITHOUT CONTRAST TECHNIQUE: Non-contiguous multidetector CT images of the paranasal sinuses were obtained in a single plane without contrast. COMPARISON:  None. FINDINGS: There is mucosal edema in the maxillary sinuses with a small amount of fluid on the right indicating maxillary sinus disease. Some fluid also layers within the sphenoid sinus. The remainder of paranasal sinuses are well pneumatized. The nasal turbinates are normal in size and position and the nasal airway is patent. The intracranial portion of the study that is visualized is unremarkable and no soft tissue abnormality is seen. IMPRESSION: Maxillary and sphenoid sinus disease. Electronically Signed   By: Ivar Drape M.D.   On: 05/23/2016 15:13

## 2016-05-24 DIAGNOSIS — E559 Vitamin D deficiency, unspecified: Secondary | ICD-10-CM | POA: Diagnosis not present

## 2016-05-24 DIAGNOSIS — R7302 Impaired glucose tolerance (oral): Secondary | ICD-10-CM | POA: Diagnosis not present

## 2016-05-24 DIAGNOSIS — I10 Essential (primary) hypertension: Secondary | ICD-10-CM | POA: Diagnosis not present

## 2016-05-24 DIAGNOSIS — G4700 Insomnia, unspecified: Secondary | ICD-10-CM | POA: Diagnosis not present

## 2016-05-24 DIAGNOSIS — J0101 Acute recurrent maxillary sinusitis: Secondary | ICD-10-CM | POA: Diagnosis not present

## 2016-05-24 DIAGNOSIS — J302 Other seasonal allergic rhinitis: Secondary | ICD-10-CM | POA: Diagnosis not present

## 2016-05-24 DIAGNOSIS — J449 Chronic obstructive pulmonary disease, unspecified: Secondary | ICD-10-CM | POA: Diagnosis not present

## 2016-05-24 NOTE — Telephone Encounter (Signed)
Pt returned call. Informed him of the results per MR. Referral placed. Pt verbalized understanding and denied any further questions or concerns at this time.

## 2016-05-28 ENCOUNTER — Encounter (HOSPITAL_COMMUNITY)
Admission: RE | Admit: 2016-05-28 | Discharge: 2016-05-28 | Disposition: A | Discharge status: Home / Self Care | Payer: Self-pay | Source: Ambulatory Visit | Attending: Internal Medicine | Admitting: Internal Medicine

## 2016-05-29 DIAGNOSIS — Z87891 Personal history of nicotine dependence: Secondary | ICD-10-CM | POA: Diagnosis not present

## 2016-05-29 DIAGNOSIS — Z7951 Long term (current) use of inhaled steroids: Secondary | ICD-10-CM | POA: Diagnosis not present

## 2016-05-29 DIAGNOSIS — R05 Cough: Secondary | ICD-10-CM | POA: Diagnosis not present

## 2016-05-29 DIAGNOSIS — J449 Chronic obstructive pulmonary disease, unspecified: Secondary | ICD-10-CM | POA: Diagnosis not present

## 2016-05-29 DIAGNOSIS — G4733 Obstructive sleep apnea (adult) (pediatric): Secondary | ICD-10-CM | POA: Diagnosis not present

## 2016-05-29 DIAGNOSIS — K219 Gastro-esophageal reflux disease without esophagitis: Secondary | ICD-10-CM | POA: Diagnosis not present

## 2016-05-29 DIAGNOSIS — J320 Chronic maxillary sinusitis: Secondary | ICD-10-CM | POA: Diagnosis not present

## 2016-05-29 DIAGNOSIS — J329 Chronic sinusitis, unspecified: Secondary | ICD-10-CM | POA: Diagnosis not present

## 2016-05-29 DIAGNOSIS — Z9989 Dependence on other enabling machines and devices: Secondary | ICD-10-CM | POA: Diagnosis not present

## 2016-05-30 ENCOUNTER — Encounter (HOSPITAL_COMMUNITY)
Admission: RE | Admit: 2016-05-30 | Discharge: 2016-05-30 | Disposition: A | Discharge status: Home / Self Care | Payer: Self-pay | Source: Ambulatory Visit | Attending: Internal Medicine | Admitting: Internal Medicine

## 2016-06-03 DIAGNOSIS — R05 Cough: Secondary | ICD-10-CM | POA: Diagnosis not present

## 2016-06-03 DIAGNOSIS — J449 Chronic obstructive pulmonary disease, unspecified: Secondary | ICD-10-CM | POA: Diagnosis not present

## 2016-06-03 DIAGNOSIS — K219 Gastro-esophageal reflux disease without esophagitis: Secondary | ICD-10-CM | POA: Diagnosis not present

## 2016-06-04 ENCOUNTER — Encounter (HOSPITAL_COMMUNITY): Payer: Medicare Other

## 2016-06-06 ENCOUNTER — Encounter (HOSPITAL_COMMUNITY): Payer: Medicare Other

## 2016-06-11 ENCOUNTER — Encounter (HOSPITAL_COMMUNITY)
Admission: RE | Admit: 2016-06-11 | Discharge: 2016-06-11 | Disposition: A | Discharge status: Home / Self Care | Payer: Self-pay | Source: Ambulatory Visit | Attending: Internal Medicine | Admitting: Internal Medicine

## 2016-06-13 ENCOUNTER — Encounter (HOSPITAL_COMMUNITY)
Admission: RE | Admit: 2016-06-13 | Discharge: 2016-06-13 | Disposition: A | Discharge status: Home / Self Care | Payer: Self-pay | Source: Ambulatory Visit | Attending: Internal Medicine | Admitting: Internal Medicine

## 2016-06-13 DIAGNOSIS — J449 Chronic obstructive pulmonary disease, unspecified: Secondary | ICD-10-CM | POA: Insufficient documentation

## 2016-06-18 ENCOUNTER — Encounter (HOSPITAL_COMMUNITY)
Admission: RE | Admit: 2016-06-18 | Discharge: 2016-06-18 | Disposition: A | Discharge status: Home / Self Care | Payer: Self-pay | Source: Ambulatory Visit | Attending: Internal Medicine | Admitting: Internal Medicine

## 2016-06-20 ENCOUNTER — Encounter: Payer: Self-pay | Admitting: Internal Medicine

## 2016-06-20 ENCOUNTER — Ambulatory Visit (INDEPENDENT_AMBULATORY_CARE_PROVIDER_SITE_OTHER): Payer: Medicare Other | Admitting: Internal Medicine

## 2016-06-20 ENCOUNTER — Encounter (HOSPITAL_COMMUNITY)
Admission: RE | Admit: 2016-06-20 | Discharge: 2016-06-20 | Disposition: A | Discharge status: Home / Self Care | Payer: Self-pay | Source: Ambulatory Visit | Attending: Internal Medicine | Admitting: Internal Medicine

## 2016-06-20 DIAGNOSIS — J449 Chronic obstructive pulmonary disease, unspecified: Secondary | ICD-10-CM

## 2016-06-20 NOTE — Assessment & Plan Note (Addendum)
#  COPD Currently  stable copd - gold stage 2 mopderate No evidence of pulmonary fibrosis or caner  PLAN -- continue spiriva scheduled  - continue rehab - continue  breo low dose  - nurse will print the CT sinus result from 05/23/16 for your files - use  3% saline nebs 51mL once daily as needed   Followup 6 months or sooner if needed

## 2016-06-20 NOTE — Progress Notes (Signed)
Subjective:     Patient ID: Jeremiah [name], male   DOB: 03/06/1946, 71 y.o.   MRN: [id number]  PCP Selena Lesser, MD  HPI  Brief patient profile:  76 yobm quit smoking 1989 with no problems with variable sob x 2004 and maintained on inhalers referred 05/03/2013 by Dr Titus Mould for evaluation of outpt management for copd after hospitalization and found to have GOLD III COPD 01/2014  with tendency to panic when sob.  Admit date: 04/24/2013  Discharge date: 04/27/2013  Discharge Diagnoses:  Active Problems:  COPD exacerbation  Detailed Hospital Course:  71 yo male with abrupt onset of sob (1/10) that progressively worsened until EMS called. Patient intubated in ED due to severe respiratory distress. In route, patient had increasing tachypnea, decline SpO2, and obtundation. Patient with GCS <8 at arrival, evidence of emesis, intubated for severe respiratory distress. Family reported no evidence of fevers, chills, malaise, worsening coughing, and sob. Initial episode passed, however sister received a call later with him reporting a return of symptoms, complaints of inability of breathing and EMS alerted. Distant history of tobacco abuse. Has never been hospitalized for COPD before this episode. He has had no recent hospitalizations, and has never been previously intubated.  He was admitted to the ICU on 1/11 and was treated with the typical modalities for acute respiratory failure (for presumed AECODP) which included mechanical ventilation with sedation, inhaled bronchodilators, antibiotics, and systemic steroid therapy. He improved later that day and was able to be extubated and sedation was stopped. His care since that point has primarily focused on the de-escalation of the therapies mentioned above. On 1/13 he has returned to a near-baseline state of health and has been deemed a candidate for discharge.  Discharge Plan by diagnoses  Acute exacerbation of COPD  Acute hypercarbic respiratory failure  (resolved)  plan  -Follow up with Dr. Melvyn Novas on 05/03/2013 at 2:30PM  -Change steroids to PO Prednisone, taper over 7-10d  -Resume home dose BDs    History of Present Illness  05/03/2013 1st [facility] office visit/ Wert cc more sob since Oct 2014 on symbicort 160 2 bid and spiriva daily  But still  daily use of proventil and no need for neb then needed neb x sev months prior to his admit above but back to baseline since d/c on pred still tapering off but using saba qid, not prn "out of habit" >>return for PFT   01/03/2014 Follow up  [name] .  Complains of increased episodes of increased DOE, wheezing, prod cough with white/clear mucus worse for last week. Family member says he has been progressively get worse over last year. Was admitted 04/2013 w/ COPD flare /VDRF . [name] from last office visit however did not follow up .   Followed at [facility] , says he was told he has Moderate COPD 6 yrs ago. On Symbicort and Spiriva.  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  Says he gets winded easily esp on incline.  Says worse for last week with increased cough and wheezing.  No hemotpysis, wt loss, calf pain, n/v/d, chest pain or fever.  Last abx ~2 months ago.  rec Doxycycline 100mg  Twice daily  For 7 days  Prednisone taper over next week Mucinex DM Twice daily  As needed  Cough /congestion  Continue on Symbicort and Spiriva .  01/24/2014 f/u ov/Wert re:  GOLD III Chief Complaint  Patient presents with  . Follow-up    PFT done today. Pt states that SOB and cough have improved some, but not back to normal baseline. Cough is prod with minimal clear sputum.   can do HT but no mall due to sob Prednisone really helped a lot but did not maintain improvement once finished >>pred taper add ppi bid and diet     12/12/2014 acute  ov/Wert re: ? aecopd /new gi/gu complaints ? From  spiriva?   Chief Complaint  Patient presents with  . Acute Visit    Pt c/o increased SOB and cough x 4 days. Cough is not very prod- clear sputum. He also c/o increased gas and bloating.   downhill since prev ov, changing his own maint rx (dc'd pepcid) >  severe cough at hs onset 12/09/14  On spiriva lots of gi/gu issues  Started pred one day prior to OV  As per calendar action plan  Has only used saba so far in hfa form, has neb but doesn't feel he needs it yet.  >changed spiriva to [place]     04/04/2015  f/u ov/Wert re: copd  Chief Complaint  Patient presents with  . Follow-up    pt following for COPD: pt states hes been its been on and off. pt states 3 weekws ago he was diagnosed with bronchitits but he feels much better. pt c/o a dry cough thats been bothering him mostly in the night time. pt states at times he has to take the CPAP off because he has these couhing fits. no c/o SOB , wheezing, or chest tightness.   has med calendar but not following the action plans at the bottom for cough  > For cough/ congestion > mucinex dm and flutter valve       07/20/2015  f/u ov/Wert re: aecopd/ using med calendar though not flutter or approp dose of mucinex dm/ confused with when to use pred  Chief Complaint  Patient presents with  . Follow-up    pt c/o nonprod cough, chest congestion, sob X4-5 days.  S/s worse qhs.    last prednisone finished on 07/16/15 and getting worse again in terms of breathing and dry coughing rec In the event that you need your nebulizer more than rarely for your breathing: prednisone 20 mg daily until better  Then 10 mg daily x 5 days and stop For cough > mucinex dm 1200 mg every 12 hours and use the flutter valve Work on inhaler technique    08/29/2015  f/u ov/Wert re:  GOLD III  symbicort 160 2bid/ tudorza  Chief Complaint  Patient presents with  . Follow-up    Cough has not improved since the last visit. He uses ventolin at least once per day and neb with  albuterol 2 x daily on average.   Still using oil based vit D, following med calendar better / rarely taking prednisone but helps when does  Lots of hoarseness and dry cough  Day >   >stop [place] and begin Spiriva     11/01/2015 NP  Acute OV  Presents for an acute office visit Patient complains of 2 weeks of increased cough, shortness of breath and wheezing. Feels that the high temperatures and humidity or making his breathing worse. Wears out with minimum activity   Denies any chest congestion/tightness, sinus pressure/drainage, fever, nausea or vomiting.  Has any fever or discolored mucus. Remains on Symbicort and Spiriva.  Did start on prednisone over the last 1-2 days. Still has persistent cough and wheezing. Cough is keeping him up at night.. Patient and wife have multiple questions about his severity of his underlying COPD. He went over purse lip breathing. He would like to try pulmonary rehabilitation again. rec Zpack to have on hold if symtpoms worsen with discolored mucus.  Prednisone taper over next week.  Hydromet 1 tsp every 6hr, As needed  Cough , may make sleepy.  Refer to pulmonary rehab    12/25/2015  f/u ov/Wert re: GOLD III copd/ symb/spiriva - using med calendar well  Chief Complaint  Patient presents with  . Follow-up    Doing well and denies any co's today.   doe [facility] = can't walk a nl pace on a flat grade s sob but does fine slow and flat eg  WM shopping ok p using [facility] parking - rare saba need -has not needed pred recently main complaint is abd distention/ constipation  rec Try spiriva just one puff each am to see if bloating/constipation improve  and if so leave the dose just at one pff - if not ok try off completely after a few weeks of one pff but if off it makes no difference at all then just restart it at 2 each am as per med calendar  See calendar for specific medication instructions    01/17/2016 acute extended ov/Wert re: GOLD III/ aecopd  Chief  Complaint  Patient presents with  . Acute Visit    Pt c/o cough x 5 days- prod with min clear sputum.  He had nosebleed last night.    severe cough onset 10/1 > min white mucus, worse at hs  Confused with names of meds and correlating them with med calendar though did bring it with him  Did not previously disclose use of tessalon but says now he was using it prn and worked fine until it ran out  Note note now on tenoretic, also not on med calendar  rec For dry cough as per med calendar > tessalon pearls 200 mg every 4-6 hours as needed and use the flutter valve For congested cough > mucinex dm up to 1200  Every 12 hours and use the flutter valve also  Increase the spiriva back to 2 puffs each am  See calendar for specific medication instructions   01/25/2016 acute extended ov/Wert re: copd gold III/ chronic dry cough  Chief Complaint  Patient presents with  . Acute Visit    Pt. c/o coughing on going  for couple of months, frequent coughing spills, some clear mucus, some sob, some wheezing, some chest pain after having coughing spells   improving ex tol at rehab s 02 Cough is worse around 3 am nightly x months but no excess mucus/ just dry hack ? Really using pepcid at hs as per med calendar  Has pred to take for flare of sob but not cough so has not tried it yet  >>pred taper , changed atenolol to bisoprolol    02/12/2016 Acute OV : COPD  He says he is feeling some better but has not gotten over his cough yet.  He started on tessalon last ov which is helping some.  Seen 2 weeks ago , given prednisone taper and change from atenolol to bisoprolol.  Chest xray last ov with copd changes , no acute process.  He denies chest pain , orthopnea, edema or fever.  Cough is mainly dry , sometimes worse  at night .  Him and his wife are worried why the cough does not go away .     OV 03/22/2016  Chief Complaint  Patient presents with  . Follow-up    Changing from MW to MR. Pt here after  PFT. Pt c/o dry cough and SOB at baseline. Pt denies CP/tightness and f/c/s.     71 year old male presents with his wife. They've been married for 2 years. In 2015 he had significant life-threatening COPD exacerbation. Since then been on triple inhaler therapy. Currently stable. He is doing pulmonary rehabilitation maintenance phase of the second time. Overall he tells me that his major complains of moderate degree of cough and shortness of breath and wheezing. The cough is essentially dry. At pulmonary rehabilitation he does not desaturate. He has been doing this for one month. He'll finish pulmonary rehabilitation and another one month. Wife's main concern is that he's having frequent COPD exacerbations. According to her at least 5 times he's had COPD exacerbation requiring prednisone and antibiotic of both in the year 2017. Most recently was 2 weeks ago. Emotions trigger it. The house does not have any pets or bad carpet or mold or mildew or cockroach.  He underwent Pulmi function testing today and compared to 2015 he is actually improved to go stage II COPD. Previously was Gold stage III COPD. He had high resolution CT chest that shows coronary artery calcification for which I do not see any evidence of cardiac workup. He has emphysema but no evidence of interstitial lung disease or cancer.  Currently he does well. It is noted that he is on testosterone shots  OV 05/17/2016  Chief Complaint  Patient presents with  . Follow-up    2 mo f/u. Breathing has been the same last visit. Denies any SOB    Follow-up Gold stage II COPD. He presents with his wife. In the past used to be Gold stage III COPD but when we repeated pulmonary function testing is probably function test that actually improved to Gold stage II in December 2017 [see below]. His main issues that he has severe chronic cough. They're also worried about recurrent COPD exacerbations. In reviewing the chart and in talking to him it appears  that the cough is severe and rated as a score of 4/5 .and is present all the time associated with thick white mucus. He also has some dyspnea. His cough is so significant that he has made some phone calls about it and he gets treated for COPD exacerbation with prednisone and antibiotic which actually helped his cough. Therefore he believes that he has recurrent COPD exacerbations. But on taking the history closely this cough is stable and is still at a level IV/V. He is frustrated by it. He denies it is clearing of the throat although in the office he is cleared his throat. His wife is wondering about other etiologies be on COPD this contributing to this cough. There is associated dyspnea that is significant. Immediate appears that these are all unchanged compared to his last visit in December 2017. But they're definitely bothering him. He did tell me that the Brio helped him. Of note he does have some postnasal drainage and review of the chart suggests that he's never had sinus CT  Of note at this time he is not interested in either roflumilast or partipation in aecopd prevention research trial     IMPRESSION: 1. No evidence of interstitial lung disease. 2. Moderate centrilobular emphysema and mild diffuse  bronchial wall thickening, consistent with the provided history of COPD. 3. Small parenchymal bands in the posterior basilar bilateral lower lobes, most consistent with mild postinfectious/postinflammatory scarring. 4. Aortic atherosclerosis. Ectatic ascending thoracic aorta, maximum diameter 4.1 cm. Recommend annual imaging followup by CTA or MRA. This recommendation follows 2010 ACCF/AHA/AATS/ACR/ASA/SCA/SCAI/SIR/STS/SVM Guidelines for the Diagnosis and Management of Patients with Thoracic Aortic Disease. Circulation. 2010; 121: R007-M226. 5. Three-vessel coronary atherosclerosis.   Electronically Signed   By: Ilona Sorrel M.D.   On: 02/20/2016 13:59    Results for LAWRNCE, REYEZ  (MRN [id number]) as of 03/22/2016 11:11  Ref. Range 01/24/2014 10:51 03/22/2016 09:59  FVC-Post Latest Units: L 2.75 3.53  FVC-%Pred-Post Latest Units: % 56 84  FVC-%Change-Post Latest Units: % 3 5  FEV1-Post Latest Units: L 1.26 1.79  FEV1-%Pred-Post Latest Units: % 34 56  Results for TIANT, PEIXOTO (MRN [id number]) as of 03/22/2016 11:11  Ref. Range 01/24/2014 10:51  DLCO unc Latest Units: ml/min/mmHg 17.16  DLCO unc % pred Latest Units: % 48   OV 06/20/2016  Chief Complaint  Patient presents with  . Follow-up    Pt states he is doing well; has cough with yellow mucus. Pt had CT maxillofacial done 05/23/16 and would like results.       Follow-up moderate COPD. He has history of recurrent exacerbations. Last visit limited CT sinus and the report suggested possible chronic sinusitis. I referred him to ENT. He does not accompany that he saw Dr. Constance Holster in ENT and was reassured. They were even told according to the history that CT sinus was normal. They're asking for CT sinus report. Overall he is doing well. He is not using 3% saline nebulizer as advised him. He is using his Spiriva and Brio. There are no new issues. COPD cat score is 23 and is similar to baseline  CAT COPD Symptom & Quality of Life Score (GSK trademark) 0 is no burden. 5 is highest burden 03/22/2016  06/20/2016   Never Cough -> Cough all the time 4 3  No phlegm in chest -> Chest is full of phlegm 1 2  No chest tightness -> Chest feels very tight 1 0  No dyspnea for 1 flight stairs/hill -> Very dyspneic for 1 flight of stairs 4 3  No limitations for ADL at home -> Very limited with ADL at home 4 3  Confident leaving home -> Not at all confident leaving home 0 5  Sleep soundly -> Do not sleep soundly because of lung condition 0 4  Lots of Energy -> No energy at all 3 3  TOTAL Score (max 40)  17 23      has a past medical history of COPD (chronic obstructive pulmonary disease) ([place]); Depression; Hypertension; PTSD  (post-traumatic stress disorder); and Sleep apnea.   reports that he quit smoking about 29 years ago. His smoking use included Cigarettes. He has a 30.00 pack-year smoking history. He has never used smokeless tobacco.  Past Surgical History:  Procedure Laterality Date  . HAMMER TOE SURGERY  April 2017    No Known Allergies  Immunization History  Administered Date(s) Administered  . Influenza Split 01/13/2013  . Influenza, High Dose Seasonal PF 12/28/2014, 01/15/2016  . Influenza,inj,Quad PF,36+ Mos 01/24/2014  . Pneumococcal-Unspecified 04/15/2010  . Tdap 12/28/2014    Family History  Problem Relation Age of Onset  . Asthma Maternal Grandmother   . Heart disease Father   . Dementia Mother      Current Outpatient Prescriptions:  .  albuterol (PROVENTIL HFA;VENTOLIN HFA) 108 (90 BASE) MCG/ACT inhaler, Inhale 2 puffs into the lungs every 4 (four) hours as needed for wheezing or shortness of breath (((PLAN B)))., Disp: 1 Inhaler, Rfl: 5 .  albuterol (PROVENTIL) (2.5 MG/3ML) 0.083% nebulizer solution, Take 3 mLs by nebulization every 4 (four) hours as needed for wheezing or shortness of breath (((PLAN C))). , Disp: , Rfl:  .  ALPRAZolam (XANAX) 0.5 MG tablet, TAKE 1 TABLET BY MOUTH TWICE A DAY AS NEEDED FOR ANXIETY, Disp: 60 tablet, Rfl: 1 .  atorvastatin (LIPITOR) 20 MG tablet, Take 20 mg by mouth daily., Disp: , Rfl:  .  B-D 3CC LUER-LOK SYR 21GX1-1/2 21G X 1-1/2" 3 ML MISC, USE NEEDLE/SYRINGE TO INJECT TESTOSTERONE INTO SKIN ONCE EVERY 2 WEEKS., Disp: , Rfl: 0 .  bisoprolol-hydrochlorothiazide (ZIAC) 5-6.25 MG tablet, Take 1 tablet by mouth daily., Disp: , Rfl:  .  buPROPion (WELLBUTRIN XL) 150 MG 24 hr tablet, Take 150 mg by mouth daily., Disp: , Rfl:  .  cetirizine (ZYRTEC) 10 MG tablet, Take 10 mg by mouth at bedtime. , Disp: , Rfl:  .  Cholecalciferol (VITAMIN D3) 2000 units TABS, Take 1 tablet by mouth every morning., Disp: , Rfl:  .  dextromethorphan-guaiFENesin (MUCINEX DM)  30-600 MG per 12 hr tablet, Take 38ml every 4 hours as needed with flutter valve for cough and congestion, Disp: , Rfl:  .  esomeprazole (NEXIUM) 40 MG capsule, Take 40 mg by mouth daily before breakfast. , Disp: , Rfl: 2 .  famotidine (PEPCID) 20 MG tablet, Take 20 mg by mouth at bedtime., Disp: , Rfl:  .  fluticasone (FLONASE) 50 MCG/ACT nasal spray, Place 2 sprays into both nostrils 2 (two) times daily., Disp: , Rfl:  .  fluticasone furoate-vilanterol (BREO ELLIPTA) 100-25 MCG/INH AEPB, Inhale 1 puff into the lungs daily., Disp: 30 each, Rfl: 5 .  magnesium oxide (MAG-OX) 400 MG tablet, Take 400 mg by mouth 2 (two) times daily. For leg cramps, Disp: , Rfl:  .  Polyvinyl Alcohol (LIQUIFILM TEARS OP), Reported on 09/13/2015, Disp: , Rfl:  .  Respiratory Therapy Supplies (FLUTTER) DEVI, Use as directed, Disp: 1 each, Rfl: 0 .  sodium chloride HYPERTONIC 3 % nebulizer solution, Take by nebulization daily., Disp: 125 mL, Rfl: 12 .  testosterone cypionate (DEPOTESTOSTERONE CYPIONATE) 200 MG/ML injection, INJECT 1 ML (200 MG TOTAL) INTO THE MUSCLE EVERY 14 (FOURTEEN) DAYS., Disp: , Rfl: 0 .  Tiotropium Bromide Monohydrate (SPIRIVA RESPIMAT) 2.5 MCG/ACT AERS, 2 each am, Disp: 1 Inhaler, Rfl: 11 .  traZODone (DESYREL) 50 MG tablet, Take 1 tablet by mouth at bedtime as needed., Disp: , Rfl:  .  benzonatate (TESSALON) 200 MG capsule, Take 1 capsule (200 mg total) by mouth 3 (three) times daily as needed for cough. (Patient not taking: Reported on 06/20/2016), Disp: 60 capsule, Rfl: 11 .  Multiple Vitamin (CALCIUM COMPLEX PO), Take by mouth. Take 3 tablets in am, Disp: , Rfl:  .  naproxen sodium (ANAPROX) 220 MG tablet, Per bottle as needed for joint pain, Disp: , Rfl:  .  Probiotic Product (PHILLIPS COLON HEALTH) CAPS, Take 1 tablet by mouth daily., Disp: , Rfl:    Review of Systems     Objective:   Physical Exam  Constitutional: He is oriented to person, place, and time. He appears well-developed and  well-nourished. No distress.  HENT:  Head: Normocephalic and atraumatic.  Right Ear: External ear normal.  Left Ear: External ear normal.  Mouth/Throat:  Oropharynx is clear and moist. No oropharyngeal exudate.  Eyes: Conjunctivae and EOM are normal. Pupils are equal, round, and reactive to light. Right eye exhibits no discharge. Left eye exhibits no discharge. No scleral icterus.  Neck: Normal range of motion. Neck supple. No JVD present. No tracheal deviation present. No thyromegaly present.  Cardiovascular: Normal rate, regular rhythm and intact distal pulses.  Exam reveals no gallop and no friction rub.   No murmur heard. Pulmonary/Chest: Effort normal and breath sounds normal. No respiratory distress. He has no wheezes. He has no rales. He exhibits no tenderness.  Abdominal: Soft. Bowel sounds are normal. He exhibits no distension and no mass. There is no tenderness. There is no rebound and no guarding.  Musculoskeletal: Normal range of motion. He exhibits no edema or tenderness.  Lymphadenopathy:    He has no cervical adenopathy.  Neurological: He is alert and oriented to person, place, and time. He has normal reflexes. No cranial nerve deficit. Coordination normal.  Skin: Skin is warm and dry. No rash noted. He is not diaphoretic. No erythema. No pallor.  Psychiatric: He has a normal mood and affect. His behavior is normal. Judgment and thought content normal.  Nursing note and vitals reviewed.  Vitals:   06/20/16 1201  BP: 130/88  Pulse: 62  SpO2: 95%  Weight: 171 lb (77.6 kg)  Height: 6' 1.5" (1.867 m)    Estimated body mass index is 22.25 kg/m as calculated from the following:   Height as of this encounter: 6' 1.5" (1.867 m).   Weight as of this encounter: 171 lb (77.6 kg).      Assessment:       ICD-9-CM ICD-10-CM   1. Stage 2 moderate COPD by GOLD classification ([place]) 496 J44.9        Plan:     Stage 2 moderate COPD by GOLD classification  (HCC) #COPD Currently  stable copd - gold stage 2 mopderate No evidence of pulmonary fibrosis or caner  PLAN -- continue spiriva scheduled  - continue rehab - continue  breo low dose  - nurse will print the CT sinus result from 05/23/16 for your files - use  3% saline nebs 71mL once daily as needed   Followup 6 months or sooner if needed    Dr. Brand Males, M.D., [facility]C.P Pulmonary and Critical Care Medicine Staff Physician [place] [facility] Pager: [phone], If no answer or between  15:00h - 7:00h: call [phone]  06/20/2016 12:25 PM

## 2016-06-20 NOTE — Patient Instructions (Addendum)
Stage 2 moderate COPD by GOLD classification ([place]) #COPD Currently  stable copd - gold stage 2 mopderate No evidence of pulmonary fibrosis or caner  PLAN -- continue spiriva scheduled  - continue rehab - continue  breo low dose  - nurse will print the CT sinus result from 05/23/16 for your files - use  3% saline nebs 11mL once daily as needed   Followup 6 months or sooner if needed

## 2016-06-25 ENCOUNTER — Encounter (HOSPITAL_COMMUNITY): Payer: Medicare Other

## 2016-06-27 ENCOUNTER — Encounter (HOSPITAL_COMMUNITY)
Admission: RE | Admit: 2016-06-27 | Discharge: 2016-06-27 | Disposition: A | Discharge status: Home / Self Care | Payer: Self-pay | Source: Ambulatory Visit | Attending: Internal Medicine | Admitting: Internal Medicine

## 2016-07-02 ENCOUNTER — Encounter (HOSPITAL_COMMUNITY)
Admission: RE | Admit: 2016-07-02 | Discharge: 2016-07-02 | Disposition: A | Discharge status: Home / Self Care | Payer: Self-pay | Source: Ambulatory Visit | Attending: Internal Medicine | Admitting: Internal Medicine

## 2016-07-04 ENCOUNTER — Encounter (HOSPITAL_COMMUNITY)
Admission: RE | Admit: 2016-07-04 | Discharge: 2016-07-04 | Disposition: A | Discharge status: Home / Self Care | Payer: Self-pay | Source: Ambulatory Visit | Attending: Internal Medicine | Admitting: Internal Medicine

## 2016-07-09 ENCOUNTER — Encounter (HOSPITAL_COMMUNITY)
Admission: RE | Admit: 2016-07-09 | Discharge: 2016-07-09 | Disposition: A | Discharge status: Home / Self Care | Payer: Self-pay | Source: Ambulatory Visit | Attending: Internal Medicine | Admitting: Internal Medicine

## 2016-07-11 ENCOUNTER — Encounter (HOSPITAL_COMMUNITY)
Admission: RE | Admit: 2016-07-11 | Discharge: 2016-07-11 | Disposition: A | Discharge status: Home / Self Care | Payer: Self-pay | Source: Ambulatory Visit | Attending: Internal Medicine | Admitting: Internal Medicine

## 2016-07-16 ENCOUNTER — Encounter (HOSPITAL_COMMUNITY)
Admission: RE | Admit: 2016-07-16 | Discharge: 2016-07-16 | Disposition: A | Discharge status: Home / Self Care | Payer: Medicare Other | Source: Ambulatory Visit | Attending: Internal Medicine | Admitting: Internal Medicine

## 2016-07-16 DIAGNOSIS — F331 Major depressive disorder, recurrent, moderate: Secondary | ICD-10-CM | POA: Diagnosis not present

## 2016-07-16 DIAGNOSIS — F4312 Post-traumatic stress disorder, chronic: Secondary | ICD-10-CM | POA: Diagnosis not present

## 2016-07-16 DIAGNOSIS — F411 Generalized anxiety disorder: Secondary | ICD-10-CM | POA: Diagnosis not present

## 2016-07-16 DIAGNOSIS — J449 Chronic obstructive pulmonary disease, unspecified: Secondary | ICD-10-CM | POA: Insufficient documentation

## 2016-07-18 ENCOUNTER — Encounter (HOSPITAL_COMMUNITY)
Admission: RE | Admit: 2016-07-18 | Discharge: 2016-07-18 | Disposition: A | Discharge status: Home / Self Care | Payer: Self-pay | Source: Ambulatory Visit | Attending: Internal Medicine | Admitting: Internal Medicine

## 2016-07-23 ENCOUNTER — Encounter (HOSPITAL_COMMUNITY)
Admission: RE | Admit: 2016-07-23 | Discharge: 2016-07-23 | Disposition: A | Discharge status: Home / Self Care | Payer: Self-pay | Source: Ambulatory Visit | Attending: Internal Medicine | Admitting: Internal Medicine

## 2016-07-25 ENCOUNTER — Encounter (HOSPITAL_COMMUNITY)
Admission: RE | Admit: 2016-07-25 | Discharge: 2016-07-25 | Disposition: A | Discharge status: Home / Self Care | Payer: Self-pay | Source: Ambulatory Visit | Attending: Internal Medicine | Admitting: Internal Medicine

## 2016-07-26 ENCOUNTER — Other Ambulatory Visit: Payer: Self-pay | Admitting: Internal Medicine

## 2016-07-26 ENCOUNTER — Telehealth: Payer: Self-pay | Admitting: Internal Medicine

## 2016-07-26 MED ORDER — PREDNISONE 10 MG PO TABS: ORAL_TABLET | ORAL | 0 refills | Status: DC

## 2016-07-26 NOTE — Telephone Encounter (Signed)
lmomtcb x1 

## 2016-07-26 NOTE — Telephone Encounter (Signed)
MW  Please Advise-  Pt called in concerned that he will be going on a cruise April 28  for 6 days and he is c/o non productive cough,chest tightness,increase sob with exertion,slight wheezing Denies fever,chills. He is requesting prednisone.   No Known Allergies

## 2016-07-26 NOTE — Telephone Encounter (Signed)
Called and spoke with pt and his wife and they are aware of MW recs to send in the pred taper.  She stated that they will pick this up and start the pt on it, but he needs an abx.  pts wife requested that this message be routed to MR to address whether an abx could be sent in or not.  She is aware that this will not be addressed until Monday.  MR please advise. Thanks  No Known Allergies

## 2016-07-26 NOTE — Telephone Encounter (Signed)
Prednisone 10 mg take  4 each am x 2 days,   2 each am x 2 days,  1 each am x 2 days and stop  

## 2016-07-29 ENCOUNTER — Telehealth: Payer: Self-pay | Admitting: Internal Medicine

## 2016-07-29 ENCOUNTER — Other Ambulatory Visit: Payer: Self-pay | Admitting: Internal Medicine

## 2016-07-29 DIAGNOSIS — I10 Essential (primary) hypertension: Secondary | ICD-10-CM | POA: Diagnosis not present

## 2016-07-29 DIAGNOSIS — E559 Vitamin D deficiency, unspecified: Secondary | ICD-10-CM | POA: Diagnosis not present

## 2016-07-29 DIAGNOSIS — B370 Candidal stomatitis: Secondary | ICD-10-CM | POA: Diagnosis not present

## 2016-07-29 DIAGNOSIS — H6503 Acute serous otitis media, bilateral: Secondary | ICD-10-CM | POA: Diagnosis not present

## 2016-07-29 DIAGNOSIS — R251 Tremor, unspecified: Secondary | ICD-10-CM | POA: Diagnosis not present

## 2016-07-29 DIAGNOSIS — J449 Chronic obstructive pulmonary disease, unspecified: Secondary | ICD-10-CM | POA: Diagnosis not present

## 2016-07-29 DIAGNOSIS — G4700 Insomnia, unspecified: Secondary | ICD-10-CM | POA: Diagnosis not present

## 2016-07-29 MED ORDER — CEPHALEXIN 500 MG PO CAPS: 500.0000 mg | ORAL_CAPSULE | Freq: Three times a day (TID) | ORAL | 0 refills | Status: DC

## 2016-07-29 NOTE — Telephone Encounter (Signed)
Spoke with pharmacist and informed them of MR's message. They understood and had no further questions. Nothing further is needed

## 2016-07-29 NOTE — Telephone Encounter (Signed)
Pt aware of recs.  rx sent to preferred pharmacy.  Nothing further needed.  

## 2016-07-29 NOTE — Telephone Encounter (Signed)
Spoke with pharmacist, states that they have received 2 abx's for pt today- keflex from MR and omnicef from Dr. Gilmer Mor.  Spoke with pt's wife who states that because of the confusion on Friday with our clinic dispensing an abx as well as the pred taper, they called "another doctor" for the abx.    MR please advise if you still want to dispense Keflex- pharmacy is holding rx on file until receiving ok from our office to dispense.  Thanks!

## 2016-07-29 NOTE — Telephone Encounter (Signed)
Can take for cruise if aecopd - cephalexin 500mg  three times daily x  5 days  Dr. Brand Males, M.D., [facility]C.P Pulmonary and Critical Care Medicine Staff Physician [place] [facility] Pager: [phone], If no answer or between  15:00h - 7:00h: call [phone]  07/29/2016 2:09 PM

## 2016-07-29 NOTE — Telephone Encounter (Signed)
omnicef from Dr Owens Shark is fine. Our keflex not needed  Dr. Brand Males, M.D., [facility].C.P Pulmonary and Critical Care Medicine Staff Physician [place] [facility] Pager: [phone], If no answer or between  15:00h - 7:00h: call [phone]  07/29/2016 4:44 PM

## 2016-07-30 ENCOUNTER — Encounter (HOSPITAL_COMMUNITY): Payer: Medicare Other

## 2016-08-01 ENCOUNTER — Telehealth: Payer: Self-pay | Admitting: Internal Medicine

## 2016-08-01 ENCOUNTER — Encounter (HOSPITAL_COMMUNITY)
Admission: RE | Admit: 2016-08-01 | Discharge: 2016-08-01 | Disposition: A | Discharge status: Home / Self Care | Payer: Self-pay | Source: Ambulatory Visit | Attending: Internal Medicine | Admitting: Internal Medicine

## 2016-08-01 NOTE — Telephone Encounter (Signed)
Called and spoke with pt and he stated that Jeremiah [name] came by to check on him and he stated that his cough is not nearly what it was before and he wanted to see if he needed to continue the nebulizer the same or cut back on this.    He also wanted to see if MR thought he would be ok to go on a cruise.  He stated that this is already booked and paid for, but he wanted to check with MR.  Please advise. Thanks  No Known Allergies

## 2016-08-02 MED ORDER — CEPHALEXIN 500 MG PO CAPS: 500.0000 mg | ORAL_CAPSULE | Freq: Three times a day (TID) | ORAL | 0 refills | Status: DC

## 2016-08-02 MED ORDER — PREDNISONE 10 MG PO TABS: ORAL_TABLET | ORAL | 0 refills | Status: DC

## 2016-08-02 NOTE — Telephone Encounter (Signed)
Nebulizer is only for prn use Continue spiriva and breo schedule Ok for cruise; take neb machine with him on [place] Also to cover for any possible aecopd duyring cruise call in and to be taken in case of emergency   - Please take prednisone 40 mg x1 day, then 30 mg x1 day, then 20 mg x1 day, then 10 mg x1 day, and then 5 mg x1 day and stop  - cephalexin 500mg  three times daily x  5 days    Dr. Brand Males, M.D., [facility].C.P Pulmonary and Critical Care Medicine Staff Physician [place] [facility] Pager: [phone], If no answer or between  15:00h - 7:00h: call [phone]  08/02/2016 8:33 AM

## 2016-08-02 NOTE — Telephone Encounter (Signed)
Called and spoke with pt and he is aware of MR recs.  He is aware that meds have been sent to [facility].  Nothing further is needed.

## 2016-08-06 ENCOUNTER — Encounter (HOSPITAL_COMMUNITY): Payer: Medicare Other

## 2016-08-08 ENCOUNTER — Encounter (HOSPITAL_COMMUNITY)
Admission: RE | Admit: 2016-08-08 | Discharge: 2016-08-08 | Disposition: A | Discharge status: Home / Self Care | Payer: Self-pay | Source: Ambulatory Visit | Attending: Internal Medicine | Admitting: Internal Medicine

## 2016-08-13 ENCOUNTER — Encounter (HOSPITAL_COMMUNITY): Payer: Medicare Other

## 2016-08-13 DIAGNOSIS — J449 Chronic obstructive pulmonary disease, unspecified: Secondary | ICD-10-CM | POA: Insufficient documentation

## 2016-08-15 ENCOUNTER — Encounter (HOSPITAL_COMMUNITY): Payer: Medicare Other

## 2016-08-20 ENCOUNTER — Encounter (HOSPITAL_COMMUNITY)
Admission: RE | Admit: 2016-08-20 | Discharge: 2016-08-20 | Disposition: A | Discharge status: Home / Self Care | Payer: Self-pay | Source: Ambulatory Visit | Attending: Internal Medicine | Admitting: Internal Medicine

## 2016-08-21 DIAGNOSIS — J449 Chronic obstructive pulmonary disease, unspecified: Secondary | ICD-10-CM | POA: Diagnosis not present

## 2016-08-21 DIAGNOSIS — G4700 Insomnia, unspecified: Secondary | ICD-10-CM | POA: Diagnosis not present

## 2016-08-21 DIAGNOSIS — R7989 Other specified abnormal findings of blood chemistry: Secondary | ICD-10-CM | POA: Diagnosis not present

## 2016-08-21 DIAGNOSIS — I10 Essential (primary) hypertension: Secondary | ICD-10-CM | POA: Diagnosis not present

## 2016-08-21 DIAGNOSIS — E559 Vitamin D deficiency, unspecified: Secondary | ICD-10-CM | POA: Diagnosis not present

## 2016-08-21 DIAGNOSIS — R7302 Impaired glucose tolerance (oral): Secondary | ICD-10-CM | POA: Diagnosis not present

## 2016-08-22 ENCOUNTER — Encounter (HOSPITAL_COMMUNITY): Payer: Medicare Other

## 2016-08-27 ENCOUNTER — Encounter (HOSPITAL_COMMUNITY)
Admission: RE | Admit: 2016-08-27 | Discharge: 2016-08-27 | Disposition: A | Discharge status: Home / Self Care | Payer: Self-pay | Source: Ambulatory Visit | Attending: Internal Medicine | Admitting: Internal Medicine

## 2016-08-29 ENCOUNTER — Encounter (HOSPITAL_COMMUNITY)
Admission: RE | Admit: 2016-08-29 | Discharge: 2016-08-29 | Disposition: A | Discharge status: Home / Self Care | Payer: Self-pay | Source: Ambulatory Visit | Attending: Internal Medicine | Admitting: Internal Medicine

## 2016-09-03 ENCOUNTER — Encounter (HOSPITAL_COMMUNITY)
Admission: RE | Admit: 2016-09-03 | Discharge: 2016-09-03 | Disposition: A | Discharge status: Home / Self Care | Payer: Self-pay | Source: Ambulatory Visit | Attending: Internal Medicine | Admitting: Internal Medicine

## 2016-09-05 ENCOUNTER — Encounter (HOSPITAL_COMMUNITY): Payer: Medicare Other

## 2016-09-10 ENCOUNTER — Encounter (HOSPITAL_COMMUNITY)
Admission: RE | Admit: 2016-09-10 | Discharge: 2016-09-10 | Disposition: A | Discharge status: Home / Self Care | Payer: Self-pay | Source: Ambulatory Visit | Attending: Internal Medicine | Admitting: Internal Medicine

## 2016-09-10 DIAGNOSIS — I10 Essential (primary) hypertension: Secondary | ICD-10-CM | POA: Diagnosis not present

## 2016-09-10 DIAGNOSIS — I2510 Atherosclerotic heart disease of native coronary artery without angina pectoris: Secondary | ICD-10-CM | POA: Diagnosis not present

## 2016-09-10 DIAGNOSIS — E7800 Pure hypercholesterolemia, unspecified: Secondary | ICD-10-CM | POA: Diagnosis not present

## 2016-09-10 DIAGNOSIS — J449 Chronic obstructive pulmonary disease, unspecified: Secondary | ICD-10-CM | POA: Diagnosis not present

## 2016-09-12 ENCOUNTER — Encounter (HOSPITAL_COMMUNITY)
Admission: RE | Admit: 2016-09-12 | Discharge: 2016-09-12 | Disposition: A | Discharge status: Home / Self Care | Payer: Self-pay | Source: Ambulatory Visit | Attending: Internal Medicine | Admitting: Internal Medicine

## 2016-09-17 ENCOUNTER — Encounter (HOSPITAL_COMMUNITY): Payer: Medicare Other

## 2016-09-17 DIAGNOSIS — J449 Chronic obstructive pulmonary disease, unspecified: Secondary | ICD-10-CM | POA: Insufficient documentation

## 2016-09-19 ENCOUNTER — Encounter (HOSPITAL_COMMUNITY)
Admission: RE | Admit: 2016-09-19 | Discharge: 2016-09-19 | Disposition: A | Discharge status: Home / Self Care | Payer: Self-pay | Source: Ambulatory Visit | Attending: Internal Medicine | Admitting: Internal Medicine

## 2016-09-24 ENCOUNTER — Encounter (HOSPITAL_COMMUNITY)
Admission: RE | Admit: 2016-09-24 | Discharge: 2016-09-24 | Disposition: A | Discharge status: Home / Self Care | Payer: Self-pay | Source: Ambulatory Visit | Attending: Internal Medicine | Admitting: Internal Medicine

## 2016-09-26 ENCOUNTER — Encounter (HOSPITAL_COMMUNITY)
Admission: RE | Admit: 2016-09-26 | Discharge: 2016-09-26 | Disposition: A | Discharge status: Home / Self Care | Payer: Self-pay | Source: Ambulatory Visit | Attending: Internal Medicine | Admitting: Internal Medicine

## 2016-09-27 ENCOUNTER — Telehealth: Payer: Self-pay | Admitting: Internal Medicine

## 2016-09-27 MED ORDER — PREDNISONE 10 MG PO TABS: ORAL_TABLET | ORAL | 0 refills | Status: DC

## 2016-09-27 MED ORDER — AZITHROMYCIN 250 MG PO TABS: 250.0000 mg | ORAL_TABLET | ORAL | 0 refills | Status: DC

## 2016-09-27 NOTE — Telephone Encounter (Signed)
Pt aware of rec's per PM Rxs called into the pharmacy.  Nothing further needed.

## 2016-09-27 NOTE — Telephone Encounter (Signed)
Spoke with patient regarding cough. He has been coughing for the past 2 weeks, non productive cough. He has taken OTC cough syrup, which is not working. Denied any symptoms of chills, SOB, or body aches.   [facility] [address].   PM, please advise. Thanks.

## 2016-09-27 NOTE — Telephone Encounter (Signed)
Call in z pack and Pred taper starting at 40 gm. Reduce dose by 10mg  every 3 days.  Marshell Garfinkel MD

## 2016-09-28 ENCOUNTER — Telehealth: Payer: Self-pay | Admitting: Acute Care

## 2016-09-28 ENCOUNTER — Other Ambulatory Visit: Payer: Self-pay | Admitting: Acute Care

## 2016-09-28 NOTE — Telephone Encounter (Signed)
Pt. Called stating the prednisone taper and Z pack that the office said they would send in yesterday was not sent. He would like [place] to send it in today. Documentation in EPIC shows that the RX was sent, but to the [facility] [address] The [facility] into [facility] [address] per the patient's request. The pharmacy confirmed they will fill the RX today. Nothing further is needed.[name] Kitchen

## 2016-10-01 ENCOUNTER — Encounter (HOSPITAL_COMMUNITY): Payer: Medicare Other

## 2016-10-01 DIAGNOSIS — H11822 Conjunctivochalasis, left eye: Secondary | ICD-10-CM | POA: Diagnosis not present

## 2016-10-01 DIAGNOSIS — H04123 Dry eye syndrome of bilateral lacrimal glands: Secondary | ICD-10-CM | POA: Diagnosis not present

## 2016-10-03 ENCOUNTER — Encounter (HOSPITAL_COMMUNITY)
Admission: RE | Admit: 2016-10-03 | Discharge: 2016-10-03 | Disposition: A | Discharge status: Home / Self Care | Payer: Self-pay | Source: Ambulatory Visit | Attending: Internal Medicine | Admitting: Internal Medicine

## 2016-10-08 ENCOUNTER — Encounter (HOSPITAL_COMMUNITY)
Admission: RE | Admit: 2016-10-08 | Discharge: 2016-10-08 | Disposition: A | Discharge status: Home / Self Care | Payer: Self-pay | Source: Ambulatory Visit | Attending: Internal Medicine | Admitting: Internal Medicine

## 2016-10-09 ENCOUNTER — Other Ambulatory Visit: Payer: Self-pay | Admitting: Internal Medicine

## 2016-10-10 ENCOUNTER — Encounter (HOSPITAL_COMMUNITY)
Admission: RE | Admit: 2016-10-10 | Discharge: 2016-10-10 | Disposition: A | Discharge status: Home / Self Care | Payer: Medicare Other | Source: Ambulatory Visit | Attending: Internal Medicine | Admitting: Internal Medicine

## 2016-10-15 ENCOUNTER — Encounter (HOSPITAL_COMMUNITY)
Admission: RE | Admit: 2016-10-15 | Discharge: 2016-10-15 | Disposition: A | Discharge status: Home / Self Care | Payer: Self-pay | Source: Ambulatory Visit | Attending: Internal Medicine | Admitting: Internal Medicine

## 2016-10-15 DIAGNOSIS — J449 Chronic obstructive pulmonary disease, unspecified: Secondary | ICD-10-CM | POA: Insufficient documentation

## 2016-10-17 ENCOUNTER — Encounter (HOSPITAL_COMMUNITY): Payer: Medicare Other

## 2016-10-22 ENCOUNTER — Encounter (HOSPITAL_COMMUNITY)
Admission: RE | Admit: 2016-10-22 | Discharge: 2016-10-22 | Disposition: A | Discharge status: Home / Self Care | Payer: Self-pay | Source: Ambulatory Visit | Attending: Internal Medicine | Admitting: Internal Medicine

## 2016-10-24 ENCOUNTER — Encounter (HOSPITAL_COMMUNITY): Payer: Medicare Other

## 2016-10-29 ENCOUNTER — Encounter (HOSPITAL_COMMUNITY)
Admission: RE | Admit: 2016-10-29 | Discharge: 2016-10-29 | Disposition: A | Discharge status: Home / Self Care | Payer: Self-pay | Source: Ambulatory Visit | Attending: Internal Medicine | Admitting: Internal Medicine

## 2016-10-30 DIAGNOSIS — H5213 Myopia, bilateral: Secondary | ICD-10-CM | POA: Diagnosis not present

## 2016-10-30 DIAGNOSIS — H16223 Keratoconjunctivitis sicca, not specified as Sjogren's, bilateral: Secondary | ICD-10-CM | POA: Diagnosis not present

## 2016-10-30 DIAGNOSIS — H0259 Other disorders affecting eyelid function: Secondary | ICD-10-CM | POA: Diagnosis not present

## 2016-10-30 DIAGNOSIS — H01001 Unspecified blepharitis right upper eyelid: Secondary | ICD-10-CM | POA: Diagnosis not present

## 2016-10-30 DIAGNOSIS — H25013 Cortical age-related cataract, bilateral: Secondary | ICD-10-CM | POA: Diagnosis not present

## 2016-10-30 DIAGNOSIS — H2513 Age-related nuclear cataract, bilateral: Secondary | ICD-10-CM | POA: Diagnosis not present

## 2016-10-30 DIAGNOSIS — H524 Presbyopia: Secondary | ICD-10-CM | POA: Diagnosis not present

## 2016-10-30 DIAGNOSIS — H01004 Unspecified blepharitis left upper eyelid: Secondary | ICD-10-CM | POA: Diagnosis not present

## 2016-10-30 DIAGNOSIS — H11823 Conjunctivochalasis, bilateral: Secondary | ICD-10-CM | POA: Diagnosis not present

## 2016-10-31 ENCOUNTER — Encounter (HOSPITAL_COMMUNITY)
Admission: RE | Admit: 2016-10-31 | Discharge: 2016-10-31 | Disposition: A | Discharge status: Home / Self Care | Payer: Self-pay | Source: Ambulatory Visit | Attending: Internal Medicine | Admitting: Internal Medicine

## 2016-11-01 DIAGNOSIS — R079 Chest pain, unspecified: Secondary | ICD-10-CM | POA: Diagnosis not present

## 2016-11-01 DIAGNOSIS — E7800 Pure hypercholesterolemia, unspecified: Secondary | ICD-10-CM | POA: Diagnosis not present

## 2016-11-01 DIAGNOSIS — I2510 Atherosclerotic heart disease of native coronary artery without angina pectoris: Secondary | ICD-10-CM | POA: Diagnosis not present

## 2016-11-04 DIAGNOSIS — I2510 Atherosclerotic heart disease of native coronary artery without angina pectoris: Secondary | ICD-10-CM | POA: Diagnosis not present

## 2016-11-04 DIAGNOSIS — E7800 Pure hypercholesterolemia, unspecified: Secondary | ICD-10-CM | POA: Diagnosis not present

## 2016-11-04 DIAGNOSIS — I10 Essential (primary) hypertension: Secondary | ICD-10-CM | POA: Diagnosis not present

## 2016-11-04 DIAGNOSIS — R748 Abnormal levels of other serum enzymes: Secondary | ICD-10-CM | POA: Diagnosis not present

## 2016-11-05 ENCOUNTER — Encounter (HOSPITAL_COMMUNITY): Payer: Medicare Other

## 2016-11-07 ENCOUNTER — Encounter (HOSPITAL_COMMUNITY)
Admission: RE | Admit: 2016-11-07 | Discharge: 2016-11-07 | Disposition: A | Discharge status: Home / Self Care | Payer: Self-pay | Source: Ambulatory Visit | Attending: Internal Medicine | Admitting: Internal Medicine

## 2016-11-12 ENCOUNTER — Encounter (HOSPITAL_COMMUNITY)
Admission: RE | Admit: 2016-11-12 | Discharge: 2016-11-12 | Disposition: A | Discharge status: Home / Self Care | Payer: Medicare Other | Source: Ambulatory Visit | Attending: Internal Medicine | Admitting: Internal Medicine

## 2016-11-12 DIAGNOSIS — F411 Generalized anxiety disorder: Secondary | ICD-10-CM | POA: Diagnosis not present

## 2016-11-12 DIAGNOSIS — F4312 Post-traumatic stress disorder, chronic: Secondary | ICD-10-CM | POA: Diagnosis not present

## 2016-11-12 DIAGNOSIS — F331 Major depressive disorder, recurrent, moderate: Secondary | ICD-10-CM | POA: Diagnosis not present

## 2016-11-12 NOTE — Progress Notes (Addendum)
Jeremiah [name] has decided to no longer attend the [facility]. He states that he has a Physiological scientist and that he plans to exercise with them. Today is his last day.

## 2016-11-14 ENCOUNTER — Encounter (HOSPITAL_COMMUNITY): Payer: Medicare Other

## 2016-11-14 ENCOUNTER — Telehealth: Payer: Self-pay | Admitting: Internal Medicine

## 2016-11-14 MED ORDER — TIOTROPIUM BROMIDE MONOHYDRATE 2.5 MCG/ACT IN AERS: 2.0000 | INHALATION_SPRAY | Freq: Every day | RESPIRATORY_TRACT | 5 refills | Status: DC

## 2016-11-14 NOTE — Telephone Encounter (Signed)
Spoke with pt. He is needing a refill on Spiriva Respimat. This has been sent in. Pt also wanted to let us know that he has stopped going to pulmonary rehab per his choice. Nothing further was needed.

## 2016-11-15 ENCOUNTER — Other Ambulatory Visit: Payer: Self-pay | Admitting: Internal Medicine

## 2016-11-19 ENCOUNTER — Encounter (HOSPITAL_COMMUNITY): Payer: Medicare Other

## 2016-11-21 ENCOUNTER — Telehealth: Payer: Self-pay | Admitting: Internal Medicine

## 2016-11-21 ENCOUNTER — Encounter (HOSPITAL_COMMUNITY): Payer: Medicare Other

## 2016-11-21 MED ORDER — TIOTROPIUM BROMIDE MONOHYDRATE 2.5 MCG/ACT IN AERS: INHALATION_SPRAY | RESPIRATORY_TRACT | 3 refills | Status: DC

## 2016-11-21 NOTE — Telephone Encounter (Signed)
Rx for [facility] [address]. I have spoken with Rollene Fare at [facility] and requested that Rx be d/c Pt is aware and voiced his understanding. Nothing further needed.

## 2016-11-28 ENCOUNTER — Telehealth: Payer: Self-pay | Admitting: Internal Medicine

## 2016-11-28 NOTE — Telephone Encounter (Signed)
Pt is requesting to restart his Prednisone -- prednisone taper Pt has been off for about 3 months.  Pt states that his cough is returning and is worse at night.  Pt has Prednisone on hand at home but just needs the instructions.   Last taper 09/2016 was for 10mg  tablets:  Sig: Take 40mg  x 3 days, then 30mg  x 3 days, then 20mg  x 3 days, then 10mg  x 3 days, then stop.  Pt advised that all of our MDs have gone home for the night but that we will call him first thing in the morning 11/29/16/ Pt advised to use his Albuterol HFA every 4-6 hours for relief until we give him call back -- for cough, wheezing and congestion.   Will send to Dr Melvyn Novas to address 11/29/16 at Dr Chase Caller is not available. Thanks.

## 2016-11-28 NOTE — Telephone Encounter (Signed)
Ok to repeat prednisone as prior

## 2016-11-29 MED ORDER — PREDNISONE 10 MG PO TABS: ORAL_TABLET | ORAL | 0 refills | Status: DC

## 2016-11-29 NOTE — Telephone Encounter (Signed)
Called and spoke with pt and he is aware of pred taper that has been called to the pharmacy for him. Nothing further is needed.

## 2016-12-05 DIAGNOSIS — F4312 Post-traumatic stress disorder, chronic: Secondary | ICD-10-CM | POA: Diagnosis not present

## 2016-12-05 DIAGNOSIS — F411 Generalized anxiety disorder: Secondary | ICD-10-CM | POA: Diagnosis not present

## 2016-12-05 DIAGNOSIS — F331 Major depressive disorder, recurrent, moderate: Secondary | ICD-10-CM | POA: Diagnosis not present

## 2016-12-24 DIAGNOSIS — H5213 Myopia, bilateral: Secondary | ICD-10-CM | POA: Diagnosis not present

## 2016-12-24 DIAGNOSIS — H11823 Conjunctivochalasis, bilateral: Secondary | ICD-10-CM | POA: Diagnosis not present

## 2016-12-24 DIAGNOSIS — H16223 Keratoconjunctivitis sicca, not specified as Sjogren's, bilateral: Secondary | ICD-10-CM | POA: Diagnosis not present

## 2016-12-24 DIAGNOSIS — H524 Presbyopia: Secondary | ICD-10-CM | POA: Diagnosis not present

## 2016-12-26 DIAGNOSIS — F411 Generalized anxiety disorder: Secondary | ICD-10-CM | POA: Diagnosis not present

## 2016-12-26 DIAGNOSIS — F4312 Post-traumatic stress disorder, chronic: Secondary | ICD-10-CM | POA: Diagnosis not present

## 2016-12-26 DIAGNOSIS — F331 Major depressive disorder, recurrent, moderate: Secondary | ICD-10-CM | POA: Diagnosis not present

## 2017-01-08 DIAGNOSIS — I10 Essential (primary) hypertension: Secondary | ICD-10-CM | POA: Diagnosis not present

## 2017-01-08 DIAGNOSIS — N3941 Urge incontinence: Secondary | ICD-10-CM | POA: Diagnosis not present

## 2017-01-08 DIAGNOSIS — N400 Enlarged prostate without lower urinary tract symptoms: Secondary | ICD-10-CM | POA: Diagnosis not present

## 2017-01-08 DIAGNOSIS — R7302 Impaired glucose tolerance (oral): Secondary | ICD-10-CM | POA: Diagnosis not present

## 2017-01-08 DIAGNOSIS — E291 Testicular hypofunction: Secondary | ICD-10-CM | POA: Diagnosis not present

## 2017-01-08 DIAGNOSIS — J449 Chronic obstructive pulmonary disease, unspecified: Secondary | ICD-10-CM | POA: Diagnosis not present

## 2017-01-08 DIAGNOSIS — B370 Candidal stomatitis: Secondary | ICD-10-CM | POA: Diagnosis not present

## 2017-01-08 DIAGNOSIS — E559 Vitamin D deficiency, unspecified: Secondary | ICD-10-CM | POA: Diagnosis not present

## 2017-01-13 ENCOUNTER — Encounter: Payer: Self-pay | Admitting: Internal Medicine

## 2017-01-13 ENCOUNTER — Ambulatory Visit (INDEPENDENT_AMBULATORY_CARE_PROVIDER_SITE_OTHER): Payer: Medicare Other | Admitting: Internal Medicine

## 2017-01-13 VITALS — BP 132/78 | HR 45 | Ht 73.5 in | Wt 179.6 lb

## 2017-01-13 DIAGNOSIS — Z23 Encounter for immunization: Secondary | ICD-10-CM

## 2017-01-13 DIAGNOSIS — J449 Chronic obstructive pulmonary disease, unspecified: Secondary | ICD-10-CM | POA: Diagnosis not present

## 2017-01-13 DIAGNOSIS — J441 Chronic obstructive pulmonary disease with (acute) exacerbation: Secondary | ICD-10-CM | POA: Diagnosis not present

## 2017-01-13 MED ORDER — FLUTICASONE-UMECLIDIN-VILANT 100-62.5-25 MCG/INH IN AEPB: 1.0000 | INHALATION_SPRAY | Freq: Every day | RESPIRATORY_TRACT | 0 refills | Status: DC

## 2017-01-13 NOTE — Addendum Note (Signed)
Addended by: Lorretta Harp on: 01/13/2017 11:02 AM   Modules accepted: Orders

## 2017-01-13 NOTE — Addendum Note (Signed)
Addended by: Lorretta Harp on: 01/13/2017 10:58 AM   Modules accepted: Orders

## 2017-01-13 NOTE — Progress Notes (Signed)
Subjective:     Patient ID: Jeremiah [name], male   DOB: December 05, 1945, 71 y.o.   MRN: [id number]  HPI  Brief patient profile:  70 yobm quit smoking 1989 with no problems with variable sob x 2004 and maintained on inhalers referred 05/03/2013 by Dr Titus Mould for evaluation of outpt management for copd after hospitalization and found to have GOLD III COPD 01/2014  with tendency to panic when sob.  Admit date: 04/24/2013  Discharge date: 04/27/2013  Discharge Diagnoses:  Active Problems:  COPD exacerbation  Detailed Hospital Course:  71 yo male with abrupt onset of sob (1/10) that progressively worsened until EMS called. Patient intubated in ED due to severe respiratory distress. In route, patient had increasing tachypnea, decline SpO2, and obtundation. Patient with GCS <8 at arrival, evidence of emesis, intubated for severe respiratory distress. Family reported no evidence of fevers, chills, malaise, worsening coughing, and sob. Initial episode passed, however sister received a call later with him reporting a return of symptoms, complaints of inability of breathing and EMS alerted. Distant history of tobacco abuse. Has never been hospitalized for COPD before this episode. He has had no recent hospitalizations, and has never been previously intubated.  He was admitted to the ICU on 1/11 and was treated with the typical modalities for acute respiratory failure (for presumed AECODP) which included mechanical ventilation with sedation, inhaled bronchodilators, antibiotics, and systemic steroid therapy. He improved later that day and was able to be extubated and sedation was stopped. His care since that point has primarily focused on the de-escalation of the therapies mentioned above. On 1/13 he has returned to a near-baseline state of health and has been deemed a candidate for discharge.  Discharge Plan by diagnoses  Acute exacerbation of COPD  Acute hypercarbic respiratory failure (resolved)  plan  -Follow up  with Dr. Melvyn Novas on 05/03/2013 at 2:30PM  -Change steroids to PO Prednisone, taper over 7-10d  -Resume home dose BDs    History of Present Illness  05/03/2013 1st [facility] office visit/ Wert cc more sob since Oct 2014 on symbicort 160 2 bid and spiriva daily  But still  daily use of proventil and no need for neb then needed neb x sev months prior to his admit above but back to baseline since d/c on pred still tapering off but using saba qid, not prn "out of habit" >>return for PFT   01/03/2014 Follow up  [name] .  Complains of increased episodes of increased DOE, wheezing, prod cough with white/clear mucus worse for last week. Family member says he has been progressively get worse over last year. Was admitted 04/2013 w/ COPD flare /VDRF . [name] from last office visit however did not follow up .   Followed at [facility] , says he was told he has Moderate COPD 6 yrs ago. On Symbicort and Spiriva.  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  Says he gets winded easily esp on incline.  Says worse for last week with increased cough and wheezing.  No hemotpysis, wt loss, calf pain, n/v/d, chest pain or fever.  Last abx ~2 months ago.  rec Doxycycline 100mg  Twice daily  For 7 days  Prednisone taper over next week Mucinex DM Twice daily  As needed  Cough /congestion  Continue on Symbicort and Spiriva .    01/24/2014 f/u ov/Wert re:  GOLD  III Chief Complaint  Patient presents with  . Follow-up    PFT done today. Pt states that SOB and cough have improved some, but not back to normal baseline. Cough is prod with minimal clear sputum.   can do HT but no mall due to sob Prednisone really helped a lot but did not maintain improvement once finished >>pred taper add ppi bid and diet     12/12/2014 acute  ov/Wert re: ? aecopd /new gi/gu complaints ? From spiriva?   Chief Complaint   Patient presents with  . Acute Visit    Pt c/o increased SOB and cough x 4 days. Cough is not very prod- clear sputum. He also c/o increased gas and bloating.   downhill since prev ov, changing his own maint rx (dc'd pepcid) >  severe cough at hs onset 12/09/14  On spiriva lots of gi/gu issues  Started pred one day prior to OV  As per calendar action plan  Has only used saba so far in hfa form, has neb but doesn't feel he needs it yet.  >changed spiriva to [place]     04/04/2015  f/u ov/Wert re: copd  Chief Complaint  Patient presents with  . Follow-up    pt following for COPD: pt states hes been its been on and off. pt states 3 weekws ago he was diagnosed with bronchitits but he feels much better. pt c/o a dry cough thats been bothering him mostly in the night time. pt states at times he has to take the CPAP off because he has these couhing fits. no c/o SOB , wheezing, or chest tightness.   has med calendar but not following the action plans at the bottom for cough  > For cough/ congestion > mucinex dm and flutter valve       07/20/2015  f/u ov/Wert re: aecopd/ using med calendar though not flutter or approp dose of mucinex dm/ confused with when to use pred  Chief Complaint  Patient presents with  . Follow-up    pt c/o nonprod cough, chest congestion, sob X4-5 days.  S/s worse qhs.    last prednisone finished on 07/16/15 and getting worse again in terms of breathing and dry coughing rec In the event that you need your nebulizer more than rarely for your breathing: prednisone 20 mg daily until better  Then 10 mg daily x 5 days and stop For cough > mucinex dm 1200 mg every 12 hours and use the flutter valve Work on inhaler technique    08/29/2015  f/u ov/Wert re:  GOLD III  symbicort 160 2bid/ tudorza  Chief Complaint  Patient presents with  . Follow-up    Cough has not improved since the last visit. He uses ventolin at least once per day and neb with albuterol 2 x daily on  average.   Still using oil based vit D, following med calendar better / rarely taking prednisone but helps when does  Lots of hoarseness and dry cough  Day >   >stop [place] and begin Spiriva     11/01/2015 NP  Acute OV  Presents for an acute office visit Patient complains of 2 weeks of increased cough, shortness of breath and wheezing. Feels that the high temperatures and humidity or making his breathing worse. Wears out with minimum activity   Denies any chest congestion/tightness, sinus pressure/drainage, fever, nausea or vomiting.  Has any fever or discolored mucus. Remains on Symbicort and Spiriva. Did start on prednisone over the  last 1-2 days. Still has persistent cough and wheezing. Cough is keeping him up at night.. Patient and wife have multiple questions about his severity of his underlying COPD. He went over purse lip breathing. He would like to try pulmonary rehabilitation again. rec Zpack to have on hold if symtpoms worsen with discolored mucus.  Prednisone taper over next week.  Hydromet 1 tsp every 6hr, As needed  Cough , may make sleepy.  Refer to pulmonary rehab    12/25/2015  f/u ov/Wert re: GOLD III copd/ symb/spiriva - using med calendar well  Chief Complaint  Patient presents with  . Follow-up    Doing well and denies any co's today.   doe [facility] = can't walk a nl pace on a flat grade s sob but does fine slow and flat eg  WM shopping ok p using [facility] parking - rare saba need -has not needed pred recently main complaint is abd distention/ constipation  rec Try spiriva just one puff each am to see if bloating/constipation improve  and if so leave the dose just at one pff - if not ok try off completely after a few weeks of one pff but if off it makes no difference at all then just restart it at 2 each am as per med calendar  See calendar for specific medication instructions    01/17/2016 acute extended ov/Wert re: GOLD III/ aecopd  Chief Complaint  Patient presents  with  . Acute Visit    Pt c/o cough x 5 days- prod with min clear sputum.  He had nosebleed last night.    severe cough onset 10/1 > min white mucus, worse at hs  Confused with names of meds and correlating them with med calendar though did bring it with him  Did not previously disclose use of tessalon but says now he was using it prn and worked fine until it ran out  Note note now on tenoretic, also not on med calendar  rec For dry cough as per med calendar > tessalon pearls 200 mg every 4-6 hours as needed and use the flutter valve For congested cough > mucinex dm up to 1200  Every 12 hours and use the flutter valve also  Increase the spiriva back to 2 puffs each am  See calendar for specific medication instructions   01/25/2016 acute extended ov/Wert re: copd gold III/ chronic dry cough  Chief Complaint  Patient presents with  . Acute Visit    Pt. c/o coughing on going  for couple of months, frequent coughing spills, some clear mucus, some sob, some wheezing, some chest pain after having coughing spells   improving ex tol at rehab s 02 Cough is worse around 3 am nightly x months but no excess mucus/ just dry hack ? Really using pepcid at hs as per med calendar  Has pred to take for flare of sob but not cough so has not tried it yet  >>pred taper , changed atenolol to bisoprolol    02/12/2016 Acute OV : COPD  He says he is feeling some better but has not gotten over his cough yet.  He started on tessalon last ov which is helping some.  Seen 2 weeks ago , given prednisone taper and change from atenolol to bisoprolol.  Chest xray last ov with copd changes , no acute process.  He denies chest pain , orthopnea, edema or fever.  Cough is mainly dry , sometimes worse at night .  Him and  his wife are worried why the cough does not go away .     OV 03/22/2016  Chief Complaint  Patient presents with  . Follow-up    Changing from MW to MR. Pt here after PFT. Pt c/o dry cough and SOB  at baseline. Pt denies CP/tightness and f/c/s.     71 year old male presents with his wife. They've been married for 2 years. In 2015 he had significant life-threatening COPD exacerbation. Since then been on triple inhaler therapy. Currently stable. He is doing pulmonary rehabilitation maintenance phase of the second time. Overall he tells me that his major complains of moderate degree of cough and shortness of breath and wheezing. The cough is essentially dry. At pulmonary rehabilitation he does not desaturate. He has been doing this for one month. He'll finish pulmonary rehabilitation and another one month. Wife's main concern is that he's having frequent COPD exacerbations. According to her at least 5 times he's had COPD exacerbation requiring prednisone and antibiotic of both in the year 2017. Most recently was 2 weeks ago. Emotions trigger it. The house does not have any pets or bad carpet or mold or mildew or cockroach.  He underwent Pulmi function testing today and compared to 2015 he is actually improved to go stage II COPD. Previously was Gold stage III COPD. He had high resolution CT chest that shows coronary artery calcification for which I do not see any evidence of cardiac workup. He has emphysema but no evidence of interstitial lung disease or cancer.  Currently he does well. It is noted that he is on testosterone shots  OV 05/17/2016  Chief Complaint  Patient presents with  . Follow-up    2 mo f/u. Breathing has been the same last visit. Denies any SOB    Follow-up Gold stage II COPD. He presents with his wife. In the past used to be Gold stage III COPD but when we repeated pulmonary function testing is probably function test that actually improved to Gold stage II in December 2017 [see below]. His main issues that he has severe chronic cough. They're also worried about recurrent COPD exacerbations. In reviewing the chart and in talking to him it appears that the cough is severe and  rated as a score of 4/5 .and is present all the time associated with thick white mucus. He also has some dyspnea. His cough is so significant that he has made some phone calls about it and he gets treated for COPD exacerbation with prednisone and antibiotic which actually helped his cough. Therefore he believes that he has recurrent COPD exacerbations. But on taking the history closely this cough is stable and is still at a level IV/V. He is frustrated by it. He denies it is clearing of the throat although in the office he is cleared his throat. His wife is wondering about other etiologies be on COPD this contributing to this cough. There is associated dyspnea that is significant. Immediate appears that these are all unchanged compared to his last visit in December 2017. But they're definitely bothering him. He did tell me that the Brio helped him. Of note he does have some postnasal drainage and review of the chart suggests that he's never had sinus CT  Of note at this time he is not interested in either roflumilast or partipation in aecopd prevention research trial     IMPRESSION: 1. No evidence of interstitial lung disease. 2. Moderate centrilobular emphysema and mild diffuse bronchial wall thickening, consistent with the  provided history of COPD. 3. Small parenchymal bands in the posterior basilar bilateral lower lobes, most consistent with mild postinfectious/postinflammatory scarring. 4. Aortic atherosclerosis. Ectatic ascending thoracic aorta, maximum diameter 4.1 cm. Recommend annual imaging followup by CTA or MRA. This recommendation follows 2010 ACCF/AHA/AATS/ACR/ASA/SCA/SCAI/SIR/STS/SVM Guidelines for the Diagnosis and Management of Patients with Thoracic Aortic Disease. Circulation. 2010; 121: T016-W109. 5. Three-vessel coronary atherosclerosis.   Electronically Signed   By: Ilona Sorrel M.D.   On: 02/20/2016 13:59    Results for LAVARR, PRESIDENT (MRN [id number]) as of  03/22/2016 11:11  Ref. Range 01/24/2014 10:51 03/22/2016 09:59  FVC-Post Latest Units: L 2.75 3.53  FVC-%Pred-Post Latest Units: % 56 84  FVC-%Change-Post Latest Units: % 3 5  FEV1-Post Latest Units: L 1.26 1.79  FEV1-%Pred-Post Latest Units: % 34 56  Results for RISHI, VICARIO (MRN [id number]) as of 03/22/2016 11:11  Ref. Range 01/24/2014 10:51  DLCO unc Latest Units: ml/min/mmHg 17.16  DLCO unc % pred Latest Units: % 48   OV 06/20/2016  Chief Complaint  Patient presents with  . Follow-up    Pt states he is doing well; has cough with yellow mucus. Pt had CT maxillofacial done 05/23/16 and would like results.       Follow-up moderate COPD. He has history of recurrent exacerbations. Last visit limited CT sinus and the report suggested possible chronic sinusitis. I referred him to ENT. He  he saw Dr. Constance Holster in ENT and was reassured. They were even told according to the history that CT sinus was normal. They're asking for CT sinus report. Overall he is doing well. He is not using 3% saline nebulizer as advised him. He is using his Spiriva and Brio. There are no new issues. COPD cat score is 23 and is similar to baseline   OV 01/13/2017  Chief Complaint  Patient presents with  . Follow-up    Pt states that he has been doing good. Pt states that he has realized he has been coughing more than usual. Occ SOB. Denies any CP.    Follow-up moderate COPD. He has a history of recurrent exacerbations. Currently on Spiriva and Brio and Singulair daily. This is a routine follow-up. He tells me that he had another exacerbation and finished a 10 day prednisone 10 days ago. He is close to baseline. He still has some coughing. He is wondering about daily prednisone after talking to a church member. He is willing to have a flu shot today. Otherwise no new complaints other than the fact that if any recurrent exacerbations over which is frustrated.    CAT COPD Symptom & Quality of Life Score (GSK trademark) 0  is no burden. 5 is highest burden 03/22/2016  06/20/2016   Never Cough -> Cough all the time 4 3  No phlegm in chest -> Chest is full of phlegm 1 2  No chest tightness -> Chest feels very tight 1 0  No dyspnea for 1 flight stairs/hill -> Very dyspneic for 1 flight of stairs 4 3  No limitations for ADL at home -> Very limited with ADL at home 4 3  Confident leaving home -> Not at all confident leaving home 0 5  Sleep soundly -> Do not sleep soundly because of lung condition 0 4  Lots of Energy -> No energy at all 3 3  TOTAL Score (max 40)  17 23       has a past medical history of COPD (chronic obstructive pulmonary disease) ([place]); Depression; Hypertension; PTSD (  post-traumatic stress disorder); and Sleep apnea.   reports that he quit smoking about 29 years ago. His smoking use included Cigarettes. He has a 30.00 pack-year smoking history. He has never used smokeless tobacco.  Past Surgical History:  Procedure Laterality Date  . HAMMER TOE SURGERY  April 2017    No Known Allergies  Immunization History  Administered Date(s) Administered  . Influenza Split 01/13/2013  . Influenza, High Dose Seasonal PF 12/28/2014, 01/15/2016  . Influenza,inj,Quad PF,6+ Mos 01/24/2014  . Pneumococcal-Unspecified 04/15/2010  . Tdap 12/28/2014    Family History  Problem Relation Age of Onset  . Asthma Maternal Grandmother   . Heart disease Father   . Dementia Mother      Current Outpatient Prescriptions:  .  albuterol (PROVENTIL HFA;VENTOLIN HFA) 108 (90 BASE) MCG/ACT inhaler, Inhale 2 puffs into the lungs every 4 (four) hours as needed for wheezing or shortness of breath (((PLAN B)))., Disp: 1 Inhaler, Rfl: 5 .  albuterol (PROVENTIL) (2.5 MG/3ML) 0.083% nebulizer solution, Take 3 mLs by nebulization every 4 (four) hours as needed for wheezing or shortness of breath (((PLAN C))). , Disp: , Rfl:  .  ALPRAZolam (XANAX) 0.5 MG tablet, TAKE 1 TABLET BY MOUTH TWICE A DAY AS NEEDED FOR ANXIETY, Disp:  60 tablet, Rfl: 1 .  atorvastatin (LIPITOR) 20 MG tablet, Take 20 mg by mouth daily., Disp: , Rfl:  .  B-D 3CC LUER-LOK SYR 21GX1-1/2 21G X 1-1/2" 3 ML MISC, USE NEEDLE/SYRINGE TO INJECT TESTOSTERONE INTO SKIN ONCE EVERY 2 WEEKS., Disp: , Rfl: 0 .  bisoprolol-hydrochlorothiazide (ZIAC) 5-6.25 MG tablet, Take 1 tablet by mouth daily., Disp: , Rfl:  .  BREO ELLIPTA 100-25 MCG/INH AEPB, INHALE 1 PUFF INTO THE LUNGS DAILY., Disp: 3 each, Rfl: 2 .  buPROPion (WELLBUTRIN XL) 150 MG 24 hr tablet, Take 150 mg by mouth daily., Disp: , Rfl:  .  cetirizine (ZYRTEC) 10 MG tablet, Take 10 mg by mouth at bedtime. , Disp: , Rfl:  .  Cholecalciferol (VITAMIN D3) 2000 units TABS, Take 1 tablet by mouth every morning., Disp: , Rfl:  .  dextromethorphan-guaiFENesin (MUCINEX DM) 30-600 MG per 12 hr tablet, Take 28ml every 4 hours as needed with flutter valve for cough and congestion, Disp: , Rfl:  .  erythromycin ophthalmic ointment, Place into both eyes nightly., Disp: , Rfl:  .  esomeprazole (NEXIUM) 40 MG capsule, Take 40 mg by mouth daily before breakfast. , Disp: , Rfl: 2 .  famotidine (PEPCID) 20 MG tablet, Take 20 mg by mouth at bedtime., Disp: , Rfl:  .  fluticasone (FLONASE) 50 MCG/ACT nasal spray, Place 2 sprays into both nostrils 2 (two) times daily., Disp: , Rfl:  .  magnesium oxide (MAG-OX) 400 MG tablet, Take 400 mg by mouth 2 (two) times daily. For leg cramps, Disp: , Rfl:  .  Multiple Vitamin (CALCIUM COMPLEX PO), Take by mouth. Take 3 tablets in am, Disp: , Rfl:  .  naproxen sodium (ANAPROX) 220 MG tablet, Per bottle as needed for joint pain, Disp: , Rfl:  .  Polyvinyl Alcohol (LIQUIFILM TEARS OP), Reported on 09/13/2015, Disp: , Rfl:  .  predniSONE (DELTASONE) 10 MG tablet, Take by mouth., Disp: , Rfl:  .  Respiratory Therapy Supplies (FLUTTER) DEVI, Use as directed, Disp: 1 each, Rfl: 0 .  testosterone cypionate (DEPOTESTOSTERONE CYPIONATE) 200 MG/ML injection, INJECT 1 ML (200 MG TOTAL) INTO THE  MUSCLE EVERY 14 (FOURTEEN) DAYS., Disp: , Rfl: 0 .  Tiotropium Bromide  Monohydrate (SPIRIVA RESPIMAT) 2.5 MCG/ACT AERS, INHALE 2 PUFFS ONCE DAILY IN THE MORNING, Disp: 1 Inhaler, Rfl: 3 .  traZODone (DESYREL) 50 MG tablet, Take 1 tablet by mouth at bedtime as needed., Disp: , Rfl:    Review of Systems     Objective:   Physical Exam  Constitutional: He is oriented to person, place, and time. He appears well-developed and well-nourished. No distress.  HENT:  Head: Normocephalic and atraumatic.  Right Ear: External ear normal.  Left Ear: External ear normal.  Mouth/Throat: Oropharynx is clear and moist. No oropharyngeal exudate.  Eyes: Pupils are equal, round, and reactive to light. Conjunctivae and EOM are normal. Right eye exhibits no discharge. Left eye exhibits no discharge. No scleral icterus.  Neck: Normal range of motion. Neck supple. No JVD present. No tracheal deviation present. No thyromegaly present.  Cardiovascular: Normal rate, regular rhythm and intact distal pulses.  Exam reveals no gallop and no friction rub.   No murmur heard. Pulmonary/Chest: Effort normal. No respiratory distress. He has wheezes. He has no rales. He exhibits no tenderness.  Really faint end expiratory wheeze  Abdominal: Soft. Bowel sounds are normal. He exhibits no distension and no mass. There is no tenderness. There is no rebound and no guarding.  Musculoskeletal: Normal range of motion. He exhibits no edema or tenderness.  Lymphadenopathy:    He has no cervical adenopathy.  Neurological: He is alert and oriented to person, place, and time. He has normal reflexes. No cranial nerve deficit. Coordination normal.  Skin: Skin is warm and dry. No rash noted. He is not diaphoretic. No erythema. No pallor.  Psychiatric: He has a normal mood and affect. His behavior is normal. Judgment and thought content normal.  Nursing note and vitals reviewed.  Vitals:   01/13/17 1024  BP: 132/78  Pulse: (!) 45  SpO2:  96%  Weight: 179 lb 9.6 oz (81.5 kg)  Height: 6' 1.5" (1.867 m)    Estimated body mass index is 23.37 kg/m as calculated from the following:   Height as of this encounter: 6' 1.5" (1.867 m).   Weight as of this encounter: 179 lb 9.6 oz (81.5 kg).     Assessment:       ICD-10-CM   1. Stage 2 moderate COPD by GOLD classification ([place]) J44.9   2. COPD, frequent exacerbations ([place]) J44.1        Plan:      COPD improved and close to or near baseline Some faint expiratory wheeze on exam; might be reflective of resolving flare up  Plan - high dose flu shot 01/13/2017  - continue singulair daily - change spiriva/breo to daily trelegy - take sample - take daily scheduled - use albuterol as needed  - in future, we can discuss daliresp for recurrent flare up prevention - last resort for frequent flare up is daily prednisone  - glad you have completed rehab and exercising on own  Followup 3 months or sooner if needed  - CAT score at followup  > 50% of this > 25 min visit spent in face to face counseling or coordination of care    Dr. Brand Males, M.D., [facility].P Pulmonary and Critical Care Medicine Staff Physician [facility] Pager: [phone], If no answer or between  15:00h - 7:00h: call [phone]  01/13/2017 10:46 AM

## 2017-01-13 NOTE — Patient Instructions (Addendum)
ICD-10-CM   1. Stage 2 moderate COPD by GOLD classification ([place]) J44.9   2. COPD, frequent exacerbations ([place]) J44.1    COPD improved and close to or near baseline Some faint expiratory wheeze on exam; might be reflective of resolving flare up  Plan - high dose flu shot 01/13/2017  - continue singulair daily - change spiriva/breo to daily trelegy - take sample - take daily scheduled - use albuterol as needed  - in future, we can discuss daliresp for recurrent flare up prevention - last resort for frequent flare up is daily prednisone  - glad you have completed rehab and exercising on own  Followup 3 months or sooner if needed  - CAT score at followup

## 2017-01-20 ENCOUNTER — Telehealth: Payer: Self-pay | Admitting: Internal Medicine

## 2017-01-21 ENCOUNTER — Telehealth: Payer: Self-pay | Admitting: Adult Health

## 2017-01-21 ENCOUNTER — Ambulatory Visit (INDEPENDENT_AMBULATORY_CARE_PROVIDER_SITE_OTHER)
Admission: RE | Admit: 2017-01-21 | Discharge: 2017-01-21 | Disposition: A | Discharge status: Home / Self Care | Payer: Medicare Other | Source: Ambulatory Visit | Attending: Adult Health | Admitting: Adult Health

## 2017-01-21 ENCOUNTER — Encounter: Payer: Self-pay | Admitting: Adult Health

## 2017-01-21 ENCOUNTER — Ambulatory Visit (INDEPENDENT_AMBULATORY_CARE_PROVIDER_SITE_OTHER): Payer: Medicare Other | Admitting: Adult Health

## 2017-01-21 VITALS — BP 134/78 | HR 54 | Ht 73.0 in | Wt 178.0 lb

## 2017-01-21 DIAGNOSIS — J441 Chronic obstructive pulmonary disease with (acute) exacerbation: Secondary | ICD-10-CM

## 2017-01-21 DIAGNOSIS — R05 Cough: Secondary | ICD-10-CM

## 2017-01-21 DIAGNOSIS — R053 Chronic cough: Secondary | ICD-10-CM

## 2017-01-21 NOTE — Telephone Encounter (Signed)
Spoke with patient's wife. She stated that the patient had a MRI done today at the [place]. They received the results and said that he has had 2 strokes within the past 2 weeks and was advised to go straight to the hospital.   Per the wife, he does not want to go the ER tonight. She wanted to know if it could wait until the morning. She said that she was tired and just wanted to wait until the morning or see if TP can refer him to a neurologist. She also asked if she could bring the patient back into the office. I advised her that we can't do anything for strokes.   TP, please advise. Thanks.

## 2017-01-21 NOTE — Telephone Encounter (Signed)
Spoke with TP in regards to the message. She agrees that the patient needs to go to the ER or contact his PCP.   Spoke with [name], advised her of TP's response. She verbalized understanding. She stated that she will call patient's PCP.   Nothing else needed at time of call.

## 2017-01-21 NOTE — Progress Notes (Signed)
@Patient  ID: Jeremiah [name], male    DOB: [birth year], 71 y.o.   MRN: [id number]  Chief Complaint  Patient presents with  . Acute Visit    COPD    Referring provider: Selena Lesser, MD  HPI: 71 yo male former smoker followed for GOLD III COPD    TEST  PFTs 01/24/2014  FEV1  1.30 (35%) ratio 49 with severe air trapping and no better p saba and dlco 48% -- referred to rehab 01/24/14  -Med calendar 02/21/2014 > did not recognize it when reprinted 03/29/14 > reviewed , 08/03/2014 , 04/28/2015   CT chest 02/2016 no ILD , emphysema changes   01/21/2017 Acute OV : Cough  Complains of 1 week of increased cough , mainly dry in nature. No wheezing. Was seen 1 week ago , changed to [place] . Feels this is helping and making breathing better. Does not feel  Sick. No fever, discolored mucus, chest pain , orthopnea, edema. Does have some post nasal drainage , ticking in throat. Uses mint cough drops to help . Has some nasal stuffiness. Using Mucinex DM As needed  Cough . Cough is aggravating , interrupts is social activities. No GERD sx .      No Known Allergies  Immunization History  Administered Date(s) Administered  . Influenza Split 01/13/2013  . Influenza, High Dose Seasonal PF 12/28/2014, 01/15/2016, 01/13/2017  . Influenza,inj,Quad PF,6+ Mos 01/24/2014  . Pneumococcal-Unspecified 04/15/2010  . Tdap 12/28/2014    Past Medical History:  Diagnosis Date  . COPD (chronic obstructive pulmonary disease) ([place])   . Depression   . Hypertension   . PTSD (post-traumatic stress disorder)   . Sleep apnea     Tobacco History: History  Smoking Status  . Former Smoker  . Packs/day: 1.00  . Years: 30.00  . Types: Cigarettes  . Quit date: 04/16/1987  Smokeless Tobacco  . Never Used   Counseling given: Not Answered   Outpatient Encounter Prescriptions as of 01/21/2017  Medication Sig  . albuterol (PROVENTIL HFA;VENTOLIN HFA) 108 (90 BASE) MCG/ACT inhaler Inhale 2 puffs into the lungs  every 4 (four) hours as needed for wheezing or shortness of breath (((PLAN B))).  [name] Kitchen albuterol (PROVENTIL) (2.5 MG/3ML) 0.083% nebulizer solution Take 3 mLs by nebulization every 4 (four) hours as needed for wheezing or shortness of breath (((PLAN C))).   [name] Kitchen ALPRAZolam (XANAX) 0.5 MG tablet TAKE 1 TABLET BY MOUTH TWICE A DAY AS NEEDED FOR ANXIETY  . aspirin EC 81 MG tablet Take 81 mg by mouth daily.  [name] Kitchen atorvastatin (LIPITOR) 20 MG tablet Take 20 mg by mouth daily.  . B-D 3CC LUER-LOK SYR 21GX1-1/2 21G X 1-1/2" 3 ML MISC USE NEEDLE/SYRINGE TO INJECT TESTOSTERONE INTO SKIN ONCE EVERY 2 WEEKS.  . bisoprolol-hydrochlorothiazide (ZIAC) 5-6.25 MG tablet Take 1 tablet by mouth daily.  [name] Kitchen buPROPion (WELLBUTRIN XL) 150 MG 24 hr tablet Take 150 mg by mouth daily.  . cetirizine (ZYRTEC) 10 MG tablet Take 10 mg by mouth at bedtime.   . Cholecalciferol (VITAMIN D3) 2000 units TABS Take 1 tablet by mouth every morning.  [name] Kitchen dextromethorphan-guaiFENesin (MUCINEX DM) 30-600 MG per 12 hr tablet Take 58ml every 4 hours as needed with flutter valve for cough and congestion  . erythromycin ophthalmic ointment Place into both eyes nightly.  . esomeprazole (NEXIUM) 40 MG capsule Take 40 mg by mouth daily before breakfast.   . famotidine (PEPCID) 20 MG tablet Take 20 mg by mouth at bedtime.  . fluticasone (  FLONASE) 50 MCG/ACT nasal spray Place 2 sprays into both nostrils 2 (two) times daily.  . Fluticasone-Umeclidin-Vilant (TRELEGY ELLIPTA) 100-62.5-25 MCG/INH AEPB Inhale 1 puff into the lungs daily.  . magnesium oxide (MAG-OX) 400 MG tablet Take 400 mg by mouth 2 (two) times daily. For leg cramps  . Multiple Vitamin (CALCIUM COMPLEX PO) Take by mouth. Take 3 tablets in am  . naproxen sodium (ANAPROX) 220 MG tablet Per bottle as needed for joint pain  . Polyvinyl Alcohol ([place] OP) Reported on 09/13/2015  . pravastatin (PRAVACHOL) 10 MG tablet Take 10 mg by mouth daily.  . predniSONE (DELTASONE) 10 MG tablet Take  by mouth.  . Respiratory Therapy Supplies (FLUTTER) DEVI Use as directed  . tamsulosin (FLOMAX) 0.4 MG CAPS capsule Take 0.4 mg by mouth daily.  [name] Kitchen testosterone cypionate (DEPOTESTOSTERONE CYPIONATE) 200 MG/ML injection INJECT 1 ML (200 MG TOTAL) INTO THE MUSCLE EVERY 14 (FOURTEEN) DAYS.  [name] Kitchen traZODone (DESYREL) 50 MG tablet Take 1 tablet by mouth at bedtime as needed.   No facility-administered encounter medications on file as of 01/21/2017.      Review of Systems  Constitutional:   No  weight loss, night sweats,  Fevers, chills, fatigue, or  lassitude.  HEENT:   No headaches,  Difficulty swallowing,  Tooth/dental problems, or  Sore throat,                No sneezing, itching, ear ache,  +nasal congestion, post nasal drip,   CV:  No chest pain,  Orthopnea, PND, swelling in lower extremities, anasarca, dizziness, palpitations, syncope.   GI  No heartburn, indigestion, abdominal pain, nausea, vomiting, diarrhea, change in bowel habits, loss of appetite, bloody stools.   Resp:    No chest wall deformity  Skin: no rash or lesions.  GU: no dysuria, change in color of urine, no urgency or frequency.  No flank pain, no hematuria   MS:  No joint pain or swelling.  No decreased range of motion.  No back pain.    Physical Exam  Pulse (!) 54   Ht 6\' 1"  (1.854 m)   Wt 178 lb (80.7 kg)   SpO2 96%   BMI 23.48 kg/m   GEN: A/Ox3; pleasant , NAD, well nourished   HEENT:  [place]/AT,  EACs-clear, TMs-wnl, NOSE-clear, THROAT-clear, no lesions, no postnasal drip or exudate noted.   NECK:  Supple w/ fair ROM; no JVD; normal carotid impulses w/o bruits; no thyromegaly or nodules palpated; no lymphadenopathy.    RESP  Clear  P & A; w/o, wheezes/ rales/ or rhonchi. no accessory muscle use, no dullness to percussion  CARD:  RRR, no m/r/g, no peripheral edema, pulses intact, no cyanosis or clubbing.  GI:   Soft & nt; nml bowel sounds; no organomegaly or masses detected.   Musco: Warm bil, no  deformities or joint swelling noted.   Neuro: alert, no focal deficits noted.    Skin: Warm, no lesions or rashes    Lab Results:  CBC  BNP No results found for: BNP  ProBNP   Imaging: No results found.   Assessment & Plan:   No problem-specific Assessment & Plan notes found for this encounter.     Rexene Edison, NP 01/21/2017

## 2017-01-21 NOTE — Telephone Encounter (Signed)
Will close this message since nothing was documented.

## 2017-01-21 NOTE — Patient Instructions (Addendum)
Continue on TRELEGY  daily .  Continue on Mucinex DM Twice daily   Use Tessalon Three times a day  For cough .  Begin Flonase 2 puffs daily  Begin Zyrtec 10mg  At bedtime   Chest xray today .  NO MINTS products .  Sips of water to soothe throat.  Follow up with Dr. Chase Caller as planned and As needed   Please contact office for sooner follow up if symptoms do not improve or worsen or seek emergency care

## 2017-01-22 ENCOUNTER — Telehealth: Payer: Self-pay | Admitting: Adult Health

## 2017-01-22 DIAGNOSIS — R053 Chronic cough: Secondary | ICD-10-CM | POA: Insufficient documentation

## 2017-01-22 DIAGNOSIS — R05 Cough: Secondary | ICD-10-CM | POA: Insufficient documentation

## 2017-01-22 MED ORDER — FLUTICASONE-UMECLIDIN-VILANT 100-62.5-25 MCG/INH IN AEPB: 1.0000 | INHALATION_SPRAY | Freq: Every day | RESPIRATORY_TRACT | 5 refills | Status: DC

## 2017-01-22 MED ORDER — BENZONATATE 100 MG PO CAPS: 100.0000 mg | ORAL_CAPSULE | Freq: Four times a day (QID) | ORAL | 1 refills | Status: DC | PRN

## 2017-01-22 NOTE — Assessment & Plan Note (Signed)
Flare of cough -check cxr  Control for triggers.   Plan  Patient Instructions  Continue on TRELEGY  daily .  Continue on Mucinex DM Twice daily   Use Tessalon Three times a day  For cough .  Begin Flonase 2 puffs daily  Begin Zyrtec 10mg  At bedtime   Chest xray today .  NO MINTS products .  Sips of water to soothe throat.  Follow up with Dr. Chase Caller as planned and As needed   Please contact office for sooner follow up if symptoms do not improve or worsen or seek emergency care

## 2017-01-22 NOTE — Telephone Encounter (Signed)
Medications have been sent to the pharmacy.   Left message for patient's wife.

## 2017-01-22 NOTE — Assessment & Plan Note (Signed)
Doubt flare as no increased sob or wheezing .  Feels improved on TRELEGY .  Does have increased cough ? Secondary to AR/Post nasal drainage . Advised on inhaler oral care.   Plan  Patient Instructions  Continue on TRELEGY  daily .  Continue on Mucinex DM Twice daily   Use Tessalon Three times a day  For cough .  Begin Flonase 2 puffs daily  Begin Zyrtec 10mg  At bedtime   Chest xray today .  NO MINTS products .  Sips of water to soothe throat.  Follow up with Dr. Chase Caller as planned and As needed   Please contact office for sooner follow up if symptoms do not improve or worsen or seek emergency care

## 2017-01-28 DIAGNOSIS — Z8673 Personal history of transient ischemic attack (TIA), and cerebral infarction without residual deficits: Secondary | ICD-10-CM | POA: Diagnosis not present

## 2017-01-28 DIAGNOSIS — J449 Chronic obstructive pulmonary disease, unspecified: Secondary | ICD-10-CM | POA: Diagnosis not present

## 2017-01-28 DIAGNOSIS — I10 Essential (primary) hypertension: Secondary | ICD-10-CM | POA: Diagnosis not present

## 2017-01-28 DIAGNOSIS — F331 Major depressive disorder, recurrent, moderate: Secondary | ICD-10-CM | POA: Diagnosis not present

## 2017-01-28 DIAGNOSIS — F411 Generalized anxiety disorder: Secondary | ICD-10-CM | POA: Diagnosis not present

## 2017-01-28 DIAGNOSIS — F4312 Post-traumatic stress disorder, chronic: Secondary | ICD-10-CM | POA: Diagnosis not present

## 2017-01-28 DIAGNOSIS — I639 Cerebral infarction, unspecified: Secondary | ICD-10-CM | POA: Diagnosis not present

## 2017-01-28 DIAGNOSIS — R413 Other amnesia: Secondary | ICD-10-CM | POA: Diagnosis not present

## 2017-01-31 ENCOUNTER — Other Ambulatory Visit: Payer: Self-pay | Admitting: Internal Medicine

## 2017-01-31 DIAGNOSIS — I10 Essential (primary) hypertension: Secondary | ICD-10-CM

## 2017-02-11 DIAGNOSIS — F411 Generalized anxiety disorder: Secondary | ICD-10-CM | POA: Diagnosis not present

## 2017-02-11 DIAGNOSIS — F331 Major depressive disorder, recurrent, moderate: Secondary | ICD-10-CM | POA: Diagnosis not present

## 2017-02-11 DIAGNOSIS — F4312 Post-traumatic stress disorder, chronic: Secondary | ICD-10-CM | POA: Diagnosis not present

## 2017-02-12 ENCOUNTER — Telehealth: Payer: Self-pay | Admitting: Adult Health

## 2017-02-12 NOTE — Telephone Encounter (Signed)
Spoke with pt, Dr Osvaldo Human would like the notes faxed to her. He states he does authorize [place] to send the records there to his doctor in [place].

## 2017-02-12 NOTE — Telephone Encounter (Signed)
I will fax in the morning.

## 2017-02-13 DIAGNOSIS — G459 Transient cerebral ischemic attack, unspecified: Secondary | ICD-10-CM | POA: Diagnosis not present

## 2017-02-13 NOTE — Telephone Encounter (Signed)
Breo and Spiriva were changed to Trelegy at the 10.1.18 visit with MR and continued at the 10.9.18 visit with TP These 2 office notes have been printed and faxed to the [facility] at the number provided by the patient Per previous documentation, pt is aware

## 2017-02-19 DIAGNOSIS — R7302 Impaired glucose tolerance (oral): Secondary | ICD-10-CM | POA: Diagnosis not present

## 2017-02-19 DIAGNOSIS — E041 Nontoxic single thyroid nodule: Secondary | ICD-10-CM | POA: Diagnosis not present

## 2017-02-19 DIAGNOSIS — I10 Essential (primary) hypertension: Secondary | ICD-10-CM | POA: Diagnosis not present

## 2017-02-19 DIAGNOSIS — I639 Cerebral infarction, unspecified: Secondary | ICD-10-CM | POA: Diagnosis not present

## 2017-02-19 DIAGNOSIS — J449 Chronic obstructive pulmonary disease, unspecified: Secondary | ICD-10-CM | POA: Diagnosis not present

## 2017-02-19 DIAGNOSIS — E559 Vitamin D deficiency, unspecified: Secondary | ICD-10-CM | POA: Diagnosis not present

## 2017-02-28 DIAGNOSIS — E042 Nontoxic multinodular goiter: Secondary | ICD-10-CM | POA: Diagnosis not present

## 2017-03-02 ENCOUNTER — Other Ambulatory Visit: Payer: Self-pay | Admitting: Internal Medicine

## 2017-03-02 DIAGNOSIS — I10 Essential (primary) hypertension: Secondary | ICD-10-CM

## 2017-03-11 DIAGNOSIS — F331 Major depressive disorder, recurrent, moderate: Secondary | ICD-10-CM | POA: Diagnosis not present

## 2017-03-11 DIAGNOSIS — F4312 Post-traumatic stress disorder, chronic: Secondary | ICD-10-CM | POA: Diagnosis not present

## 2017-03-11 DIAGNOSIS — F411 Generalized anxiety disorder: Secondary | ICD-10-CM | POA: Diagnosis not present

## 2017-03-13 DIAGNOSIS — I10 Essential (primary) hypertension: Secondary | ICD-10-CM | POA: Diagnosis not present

## 2017-03-13 DIAGNOSIS — I639 Cerebral infarction, unspecified: Secondary | ICD-10-CM | POA: Diagnosis not present

## 2017-03-13 DIAGNOSIS — E041 Nontoxic single thyroid nodule: Secondary | ICD-10-CM | POA: Diagnosis not present

## 2017-03-13 DIAGNOSIS — H6122 Impacted cerumen, left ear: Secondary | ICD-10-CM | POA: Diagnosis not present

## 2017-03-20 ENCOUNTER — Other Ambulatory Visit: Payer: Self-pay

## 2017-03-31 ENCOUNTER — Telehealth: Payer: Self-pay | Admitting: Internal Medicine

## 2017-03-31 NOTE — Telephone Encounter (Signed)
Spoke with pt, he states he has been coughing and the [place] works. I advised him that clinically the thyroid does not have anything to do with COPD and that this may be a separate issue. Nothing further is needed.

## 2017-04-01 ENCOUNTER — Ambulatory Visit: Payer: Medicare Other | Admitting: Internal Medicine

## 2017-04-03 DIAGNOSIS — E041 Nontoxic single thyroid nodule: Secondary | ICD-10-CM | POA: Diagnosis not present

## 2017-04-07 ENCOUNTER — Telehealth: Payer: Self-pay | Admitting: Internal Medicine

## 2017-04-07 MED ORDER — ALBUTEROL SULFATE HFA 108 (90 BASE) MCG/ACT IN AERS: 2.0000 | INHALATION_SPRAY | RESPIRATORY_TRACT | 5 refills | Status: DC | PRN

## 2017-04-07 NOTE — Telephone Encounter (Signed)
Pt's spouse is aware that Ventolin has been sent to preferred pharmacy. Nothing further is needed.

## 2017-04-13 DIAGNOSIS — J449 Chronic obstructive pulmonary disease, unspecified: Secondary | ICD-10-CM | POA: Diagnosis not present

## 2017-04-13 DIAGNOSIS — Z87891 Personal history of nicotine dependence: Secondary | ICD-10-CM | POA: Diagnosis not present

## 2017-04-13 DIAGNOSIS — E785 Hyperlipidemia, unspecified: Secondary | ICD-10-CM | POA: Diagnosis not present

## 2017-04-13 DIAGNOSIS — R55 Syncope and collapse: Secondary | ICD-10-CM | POA: Diagnosis not present

## 2017-04-13 DIAGNOSIS — R404 Transient alteration of awareness: Secondary | ICD-10-CM | POA: Diagnosis not present

## 2017-04-13 DIAGNOSIS — N179 Acute kidney failure, unspecified: Secondary | ICD-10-CM | POA: Diagnosis not present

## 2017-04-13 DIAGNOSIS — E041 Nontoxic single thyroid nodule: Secondary | ICD-10-CM | POA: Diagnosis not present

## 2017-04-13 DIAGNOSIS — Z823 Family history of stroke: Secondary | ICD-10-CM | POA: Diagnosis not present

## 2017-04-13 DIAGNOSIS — Z7982 Long term (current) use of aspirin: Secondary | ICD-10-CM | POA: Diagnosis not present

## 2017-04-13 DIAGNOSIS — R9082 White matter disease, unspecified: Secondary | ICD-10-CM | POA: Diagnosis not present

## 2017-04-13 DIAGNOSIS — I10 Essential (primary) hypertension: Secondary | ICD-10-CM | POA: Diagnosis not present

## 2017-04-13 DIAGNOSIS — G319 Degenerative disease of nervous system, unspecified: Secondary | ICD-10-CM | POA: Diagnosis not present

## 2017-04-13 DIAGNOSIS — R001 Bradycardia, unspecified: Secondary | ICD-10-CM | POA: Diagnosis not present

## 2017-04-14 ENCOUNTER — Telehealth: Payer: Self-pay | Admitting: Internal Medicine

## 2017-04-14 DIAGNOSIS — R55 Syncope and collapse: Secondary | ICD-10-CM | POA: Diagnosis not present

## 2017-04-14 DIAGNOSIS — R001 Bradycardia, unspecified: Secondary | ICD-10-CM | POA: Diagnosis not present

## 2017-04-14 NOTE — Telephone Encounter (Signed)
Called and spoke with pt's spouse, who states pt will discharged today. [name] states pt will have f/u with PCP in a few days. [name] states if pt's symptoms worsen, she will take pt to [facility].  Nothing further is needed.  Routing to MR to make aware.

## 2017-04-14 NOTE — Telephone Encounter (Signed)
Please wish Jeremiah [name] and wife the best and happy 2019. However, inopatient transfer request has to come from [facility].   Just as you recommended but you can call back and reiterate to wife. And if [facility] should be accepting doc here depends on the situation and what [facility] and the [place] MD decide  Dr. Brand Males, M.D., [facility].C.P Pulmonary and Critical Care Medicine Staff Physician, [place] Director - Interstitial Lung Disease  Program  Pulmonary [place] at [place], [place], [id number]  Pager: [phone], If no answer or between  15:00h - 7:00h: call [phone] Telephone: [phone]

## 2017-04-14 NOTE — Telephone Encounter (Signed)
Spoke with pt's wife- Jeremiah [name] Pt past out in church in [place], [place] went to ED in [place], [place] overnight since yesterday for review due to pulse below 50.  Pt's wife requesting pt to be transferred to [facility] today. Advised wife to speak with nurse in [facility] hospital regarding her request. She had not done this yet, will be calling nurse this morning.  Routing message to MR to inform him of this situation.

## 2017-04-25 ENCOUNTER — Ambulatory Visit (INDEPENDENT_AMBULATORY_CARE_PROVIDER_SITE_OTHER): Payer: Medicare Other | Admitting: Internal Medicine

## 2017-04-25 ENCOUNTER — Encounter: Payer: Self-pay | Admitting: Internal Medicine

## 2017-04-25 VITALS — BP 138/80 | HR 64 | Ht 73.5 in | Wt 171.0 lb

## 2017-04-25 DIAGNOSIS — J449 Chronic obstructive pulmonary disease, unspecified: Secondary | ICD-10-CM | POA: Diagnosis not present

## 2017-04-25 MED ORDER — ALBUTEROL SULFATE HFA 108 (90 BASE) MCG/ACT IN AERS: 2.0000 | INHALATION_SPRAY | RESPIRATORY_TRACT | 5 refills | Status: DC | PRN

## 2017-04-25 NOTE — Patient Instructions (Addendum)
ICD-10-CM   1. Stage 2 moderate COPD by GOLD classification ([place]) J44.9    Continue trelegy daily Use albuterol as needed Good idea to visit Dr Woody Seller to understand syncope over new year's eve  Followup - 6 months or sooner if needed

## 2017-04-25 NOTE — Addendum Note (Signed)
Addended by: Elton Sin on: 04/25/2017 11:12 AM   Modules accepted: Orders

## 2017-04-25 NOTE — Progress Notes (Signed)
Subjective:     Patient ID: Jeremiah [name], male   DOB: 07-18-1945, 72 y.o.   MRN: [id number]  PCP Selena Lesser, MD   HPI  Brief patient profile:  63 yobm quit smoking 1989 with no problems with variable sob x 2004 and maintained on inhalers referred 05/03/2013 by Dr Titus Mould for evaluation of outpt management for copd after hospitalization and found to have GOLD III COPD 01/2014  with tendency to panic when sob.  Admit date: 04/24/2013  Discharge date: 04/27/2013  Discharge Diagnoses:  Active Problems:  COPD exacerbation  Detailed Hospital Course:  72 yo male with abrupt onset of sob (1/10) that progressively worsened until EMS called. Patient intubated in ED due to severe respiratory distress. In route, patient had increasing tachypnea, decline SpO2, and obtundation. Patient with GCS <8 at arrival, evidence of emesis, intubated for severe respiratory distress. Family reported no evidence of fevers, chills, malaise, worsening coughing, and sob. Initial episode passed, however sister received a call later with him reporting a return of symptoms, complaints of inability of breathing and EMS alerted. Distant history of tobacco abuse. Has never been hospitalized for COPD before this episode. He has had no recent hospitalizations, and has never been previously intubated.  He was admitted to the ICU on 1/11 and was treated with the typical modalities for acute respiratory failure (for presumed AECODP) which included mechanical ventilation with sedation, inhaled bronchodilators, antibiotics, and systemic steroid therapy. He improved later that day and was able to be extubated and sedation was stopped. His care since that point has primarily focused on the de-escalation of the therapies mentioned above. On 1/13 he has returned to a near-baseline state of health and has been deemed a candidate for discharge.  Discharge Plan by diagnoses  Acute exacerbation of COPD  Acute hypercarbic respiratory failure  (resolved)  plan  -Follow up with Dr. Melvyn Novas on 05/03/2013 at 2:30PM  -Change steroids to PO Prednisone, taper over 7-10d  -Resume home dose BDs    History of Present Illness  05/03/2013 1st [facility] office visit/ Wert cc more sob since Oct 2014 on symbicort 160 2 bid and spiriva daily  But still  daily use of proventil and no need for neb then needed neb x sev months prior to his admit above but back to baseline since d/c on pred still tapering off but using saba qid, not prn "out of habit" >>return for PFT   01/03/2014 Follow up  [name] .  Complains of increased episodes of increased DOE, wheezing, prod cough with white/clear mucus worse for last week. Family member says he has been progressively get worse over last year. Was admitted 04/2013 w/ COPD flare /VDRF . [name] from last office visit however did not follow up .   Followed at [facility] , says he was told he has Moderate COPD 6 yrs ago. On Symbicort and Spiriva.  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  Says he gets winded easily esp on incline.  Says worse for last week with increased cough and wheezing.  No hemotpysis, wt loss, calf pain, n/v/d, chest pain or fever.  Last abx ~2 months ago.  rec Doxycycline 100mg  Twice daily  For 7 days  Prednisone taper over next week Mucinex DM Twice daily  As needed  Cough /congestion  Continue on Symbicort and Spiriva .  01/24/2014 f/u ov/Wert re:  GOLD III Chief Complaint  Patient presents with  . Follow-up    PFT done today. Pt states that SOB and cough have improved some, but not back to normal baseline. Cough is prod with minimal clear sputum.   can do HT but no mall due to sob Prednisone really helped a lot but did not maintain improvement once finished >>pred taper add ppi bid and diet     12/12/2014 acute  ov/Wert re: ? aecopd /new gi/gu complaints ? From  spiriva?   Chief Complaint  Patient presents with  . Acute Visit    Pt c/o increased SOB and cough x 4 days. Cough is not very prod- clear sputum. He also c/o increased gas and bloating.   downhill since prev ov, changing his own maint rx (dc'd pepcid) >  severe cough at hs onset 12/09/14  On spiriva lots of gi/gu issues  Started pred one day prior to OV  As per calendar action plan  Has only used saba so far in hfa form, has neb but doesn't feel he needs it yet.  >changed spiriva to [place]     04/04/2015  f/u ov/Wert re: copd  Chief Complaint  Patient presents with  . Follow-up    pt following for COPD: pt states hes been its been on and off. pt states 3 weekws ago he was diagnosed with bronchitits but he feels much better. pt c/o a dry cough thats been bothering him mostly in the night time. pt states at times he has to take the CPAP off because he has these couhing fits. no c/o SOB , wheezing, or chest tightness.   has med calendar but not following the action plans at the bottom for cough  > For cough/ congestion > mucinex dm and flutter valve       07/20/2015  f/u ov/Wert re: aecopd/ using med calendar though not flutter or approp dose of mucinex dm/ confused with when to use pred  Chief Complaint  Patient presents with  . Follow-up    pt c/o nonprod cough, chest congestion, sob X4-5 days.  S/s worse qhs.    last prednisone finished on 07/16/15 and getting worse again in terms of breathing and dry coughing rec In the event that you need your nebulizer more than rarely for your breathing: prednisone 20 mg daily until better  Then 10 mg daily x 5 days and stop For cough > mucinex dm 1200 mg every 12 hours and use the flutter valve Work on inhaler technique    08/29/2015  f/u ov/Wert re:  GOLD III  symbicort 160 2bid/ tudorza  Chief Complaint  Patient presents with  . Follow-up    Cough has not improved since the last visit. He uses ventolin at least once per day and neb with  albuterol 2 x daily on average.   Still using oil based vit D, following med calendar better / rarely taking prednisone but helps when does  Lots of hoarseness and dry cough  Day >   >stop [place] and begin Spiriva     11/01/2015 NP  Acute OV  Presents for an acute office visit Patient complains of 2 weeks of increased cough, shortness of breath and wheezing. Feels that the high temperatures and humidity or making his breathing worse. Wears out with minimum activity   Denies any chest congestion/tightness, sinus pressure/drainage, fever, nausea or vomiting.  Has any fever or discolored mucus. Remains on Symbicort and Spiriva.  Did start on prednisone over the last 1-2 days. Still has persistent cough and wheezing. Cough is keeping him up at night.. Patient and wife have multiple questions about his severity of his underlying COPD. He went over purse lip breathing. He would like to try pulmonary rehabilitation again. rec Zpack to have on hold if symtpoms worsen with discolored mucus.  Prednisone taper over next week.  Hydromet 1 tsp every 6hr, As needed  Cough , may make sleepy.  Refer to pulmonary rehab    12/25/2015  f/u ov/Wert re: GOLD III copd/ symb/spiriva - using med calendar well  Chief Complaint  Patient presents with  . Follow-up    Doing well and denies any co's today.   doe [facility] = can't walk a nl pace on a flat grade s sob but does fine slow and flat eg  WM shopping ok p using [facility] parking - rare saba need -has not needed pred recently main complaint is abd distention/ constipation  rec Try spiriva just one puff each am to see if bloating/constipation improve  and if so leave the dose just at one pff - if not ok try off completely after a few weeks of one pff but if off it makes no difference at all then just restart it at 2 each am as per med calendar  See calendar for specific medication instructions    01/17/2016 acute extended ov/Wert re: GOLD III/ aecopd  Chief  Complaint  Patient presents with  . Acute Visit    Pt c/o cough x 5 days- prod with min clear sputum.  He had nosebleed last night.    severe cough onset 10/1 > min white mucus, worse at hs  Confused with names of meds and correlating them with med calendar though did bring it with him  Did not previously disclose use of tessalon but says now he was using it prn and worked fine until it ran out  Note note now on tenoretic, also not on med calendar  rec For dry cough as per med calendar > tessalon pearls 200 mg every 4-6 hours as needed and use the flutter valve For congested cough > mucinex dm up to 1200  Every 12 hours and use the flutter valve also  Increase the spiriva back to 2 puffs each am  See calendar for specific medication instructions   01/25/2016 acute extended ov/Wert re: copd gold III/ chronic dry cough  Chief Complaint  Patient presents with  . Acute Visit    Pt. c/o coughing on going  for couple of months, frequent coughing spills, some clear mucus, some sob, some wheezing, some chest pain after having coughing spells   improving ex tol at rehab s 02 Cough is worse around 3 am nightly x months but no excess mucus/ just dry hack ? Really using pepcid at hs as per med calendar  Has pred to take for flare of sob but not cough so has not tried it yet  >>pred taper , changed atenolol to bisoprolol    02/12/2016 Acute OV : COPD  He says he is feeling some better but has not gotten over his cough yet.  He started on tessalon last ov which is helping some.  Seen 2 weeks ago , given prednisone taper and change from atenolol to bisoprolol.  Chest xray last ov with copd changes , no acute process.  He denies chest pain , orthopnea, edema or fever.  Cough is mainly dry , sometimes worse  at night .  Him and his wife are worried why the cough does not go away .     OV 03/22/2016  Chief Complaint  Patient presents with  . Follow-up    Changing from MW to MR. Pt here after  PFT. Pt c/o dry cough and SOB at baseline. Pt denies CP/tightness and f/c/s.     72 year old male presents with his wife. They've been married for 2 years. In 2015 he had significant life-threatening COPD exacerbation. Since then been on triple inhaler therapy. Currently stable. He is doing pulmonary rehabilitation maintenance phase of the second time. Overall he tells me that his major complains of moderate degree of cough and shortness of breath and wheezing. The cough is essentially dry. At pulmonary rehabilitation he does not desaturate. He has been doing this for one month. He'll finish pulmonary rehabilitation and another one month. Wife's main concern is that he's having frequent COPD exacerbations. According to her at least 5 times he's had COPD exacerbation requiring prednisone and antibiotic of both in the year 2017. Most recently was 2 weeks ago. Emotions trigger it. The house does not have any pets or bad carpet or mold or mildew or cockroach.  He underwent Pulmi function testing today and compared to 2015 he is actually improved to go stage II COPD. Previously was Gold stage III COPD. He had high resolution CT chest that shows coronary artery calcification for which I do not see any evidence of cardiac workup. He has emphysema but no evidence of interstitial lung disease or cancer.  Currently he does well. It is noted that he is on testosterone shots  OV 05/17/2016  Chief Complaint  Patient presents with  . Follow-up    2 mo f/u. Breathing has been the same last visit. Denies any SOB    Follow-up Gold stage II COPD. He presents with his wife. In the past used to be Gold stage III COPD but when we repeated pulmonary function testing is probably function test that actually improved to Gold stage II in December 2017 [see below]. His main issues that he has severe chronic cough. They're also worried about recurrent COPD exacerbations. In reviewing the chart and in talking to him it appears  that the cough is severe and rated as a score of 4/5 .and is present all the time associated with thick white mucus. He also has some dyspnea. His cough is so significant that he has made some phone calls about it and he gets treated for COPD exacerbation with prednisone and antibiotic which actually helped his cough. Therefore he believes that he has recurrent COPD exacerbations. But on taking the history closely this cough is stable and is still at a level IV/V. He is frustrated by it. He denies it is clearing of the throat although in the office he is cleared his throat. His wife is wondering about other etiologies be on COPD this contributing to this cough. There is associated dyspnea that is significant. Immediate appears that these are all unchanged compared to his last visit in December 2017. But they're definitely bothering him. He did tell me that the Brio helped him. Of note he does have some postnasal drainage and review of the chart suggests that he's never had sinus CT  Of note at this time he is not interested in either roflumilast or partipation in aecopd prevention research trial     IMPRESSION: 1. No evidence of interstitial lung disease. 2. Moderate centrilobular emphysema and mild diffuse  bronchial wall thickening, consistent with the provided history of COPD. 3. Small parenchymal bands in the posterior basilar bilateral lower lobes, most consistent with mild postinfectious/postinflammatory scarring. 4. Aortic atherosclerosis. Ectatic ascending thoracic aorta, maximum diameter 4.1 cm. Recommend annual imaging followup by CTA or MRA. This recommendation follows 2010 ACCF/AHA/AATS/ACR/ASA/SCA/SCAI/SIR/STS/SVM Guidelines for the Diagnosis and Management of Patients with Thoracic Aortic Disease. Circulation. 2010; 121: M094-B096. 5. Three-vessel coronary atherosclerosis.   Electronically Signed   By: Ilona Sorrel M.D.   On: 02/20/2016 13:59    Results for FAROOQ, PETROVICH  (MRN [id number]) as of 03/22/2016 11:11  Ref. Range 01/24/2014 10:51 03/22/2016 09:59  FVC-Post Latest Units: L 2.75 3.53  FVC-%Pred-Post Latest Units: % 56 84  FVC-%Change-Post Latest Units: % 3 5  FEV1-Post Latest Units: L 1.26 1.79  FEV1-%Pred-Post Latest Units: % 34 56  Results for SION, REINDERS (MRN [id number]) as of 03/22/2016 11:11  Ref. Range 01/24/2014 10:51  DLCO unc Latest Units: ml/min/mmHg 17.16  DLCO unc % pred Latest Units: % 48   OV 06/20/2016  Chief Complaint  Patient presents with  . Follow-up    Pt states he is doing well; has cough with yellow mucus. Pt had CT maxillofacial done 05/23/16 and would like results.       Follow-up moderate COPD. He has history of recurrent exacerbations. Last visit limited CT sinus and the report suggested possible chronic sinusitis. I referred him to ENT. He  he saw Dr. Constance Holster in ENT and was reassured. They were even told according to the history that CT sinus was normal. They're asking for CT sinus report. Overall he is doing well. He is not using 3% saline nebulizer as advised him. He is using his Spiriva and Brio. There are no new issues. COPD cat score is 23 and is similar to baseline   OV 01/13/2017  Chief Complaint  Patient presents with  . Follow-up    Pt states that he has been doing good. Pt states that he has realized he has been coughing more than usual. Occ SOB. Denies any CP.    Follow-up moderate COPD. He has a history of recurrent exacerbations. Currently on Spiriva and Brio and Singulair daily. This is a routine follow-up. He tells me that he had another exacerbation and finished a 10 day prednisone 10 days ago. He is close to baseline. He still has some coughing. He is wondering about daily prednisone after talking to a church member. He is willing to have a flu shot today. Otherwise no new complaints other than the fact that if any recurrent exacerbations over which is frustrated.  01/21/2017 Acute OV : Cough  Complains of  1 week of increased cough , mainly dry in nature. No wheezing. Was seen 1 week ago , changed to [place] . Feels this is helping and making breathing better. Does not feel  Sick. No fever, discolored mucus, chest pain , orthopnea, edema. Does have some post nasal drainage , ticking in throat. Uses mint cough drops to help . Has some nasal stuffiness. Using Mucinex DM As needed  Cough . Cough is aggravating , interrupts is social activities. No GERD sx .    OV 04/25/2017  Chief Complaint  Patient presents with  . Follow-up    Pt had recent hospitalization at [facility] in [place], and was there 1 night before they left. States that he is doing good now and has no complaints of cough, SOB, orCP.   72 year old with Gold stage III COPD that  changed to Gold stage II COPD after inhaler treatment.  He is now on trilogy inhaler.  Overall COPD stable.  In fact after starting trilogy inhaler his COPD CAT score is improved to 17.  He is here with his daughter.  The only issue is that on New Year's Eve that he had a vasovagal syncope episode and was taken to [facility] ER.  That his heart rate was 51 which is his baseline but they try to admit him and he walked out AMA.  It appears that he might have had some postural hypotension related to Viagra intake.  He has appointment with Dr. Woody Seller cardiologist pending.    CAT COPD Symptom & Quality of Life Score (GSK trademark) 0 is no burden. 5 is highest burden 03/22/2016  06/20/2016  04/25/2017   Never Cough -> Cough all the time 4 3 4   No phlegm in chest -> Chest is full of phlegm 1 2 2   No chest tightness -> Chest feels very tight 1 0 0  No dyspnea for 1 flight stairs/hill -> Very dyspneic for 1 flight of stairs 4 3 2   No limitations for ADL at home -> Very limited with ADL at home 4 3 3   Confident leaving home -> Not at all confident leaving home 0 5 2  Sleep soundly -> Do not sleep soundly because of lung condition 0 4 3  Lots of Energy -> No energy at all 3  3 1   TOTAL Score (max 40)  17 23 17     TEST  PFTs 01/24/2014  FEV1  1.30 (35%) ratio 49 with severe air trapping and no better p saba and dlco 48% -- referred to rehab 01/24/14  -Med calendar 02/21/2014 > did not recognize it when reprinted 03/29/14 > reviewed , 08/03/2014 , 04/28/2015   CT chest 02/2016 no ILD , emphysema changes       has a past medical history of COPD (chronic obstructive pulmonary disease) ([place]), Depression, Hypertension, PTSD (post-traumatic stress disorder), and Sleep apnea.   reports that he quit smoking about 30 years ago. His smoking use included cigarettes. He has a 30.00 pack-year smoking history. he has never used smokeless tobacco.  Past Surgical History:  Procedure Laterality Date  . HAMMER TOE SURGERY  April 2017    No Known Allergies  Immunization History  Administered Date(s) Administered  . Influenza Split 01/13/2013  . Influenza, High Dose Seasonal PF 12/28/2014, 01/15/2016, 01/13/2017  . Influenza,inj,Quad PF,6+ Mos 01/24/2014  . Pneumococcal-Unspecified 04/15/2010  . Tdap 12/28/2014    Family History  Problem Relation Age of Onset  . Asthma Maternal Grandmother   . Heart disease Father   . Dementia Mother      Current Outpatient Medications:  .  albuterol (PROVENTIL HFA;VENTOLIN HFA) 108 (90 Base) MCG/ACT inhaler, Inhale 2 puffs into the lungs every 4 (four) hours as needed for wheezing or shortness of breath (((PLAN B)))., Disp: 1 Inhaler, Rfl: 5 .  albuterol (PROVENTIL) (2.5 MG/3ML) 0.083% nebulizer solution, Take 3 mLs by nebulization every 4 (four) hours as needed for wheezing or shortness of breath (((PLAN C))). , Disp: , Rfl:  .  ALPRAZolam (XANAX) 0.5 MG tablet, TAKE 1 TABLET BY MOUTH TWICE A DAY AS NEEDED FOR ANXIETY, Disp: 60 tablet, Rfl: 1 .  aspirin 325 MG tablet, Take 325 mg by mouth daily., Disp: , Rfl:  .  B-D 3CC LUER-LOK SYR 21GX1-1/2 21G X 1-1/2" 3 ML MISC, USE NEEDLE/SYRINGE TO INJECT TESTOSTERONE INTO SKIN ONCE  EVERY  2 WEEKS., Disp: , Rfl: 0 .  dextromethorphan-guaiFENesin (MUCINEX DM) 30-600 MG per 12 hr tablet, Take 41ml every 4 hours as needed with flutter valve for cough and congestion, Disp: , Rfl:  .  erythromycin ophthalmic ointment, Place into both eyes nightly., Disp: , Rfl:  .  esomeprazole (NEXIUM) 40 MG capsule, Take 40 mg by mouth daily before breakfast. , Disp: , Rfl: 2 .  famotidine (PEPCID) 20 MG tablet, Take 20 mg by mouth at bedtime., Disp: , Rfl:  .  fluticasone (FLONASE) 50 MCG/ACT nasal spray, Place 2 sprays into both nostrils 2 (two) times daily., Disp: , Rfl:  .  Fluticasone-Umeclidin-Vilant (TRELEGY ELLIPTA) 100-62.5-25 MCG/INH AEPB, Inhale 1 puff into the lungs daily., Disp: 60 each, Rfl: 5 .  magnesium oxide (MAG-OX) 400 MG tablet, Take 400 mg by mouth 2 (two) times daily. For leg cramps, Disp: , Rfl:  .  Multiple Vitamin (CALCIUM COMPLEX PO), Take by mouth. Take 3 tablets in am, Disp: , Rfl:  .  pravastatin (PRAVACHOL) 10 MG tablet, Take 10 mg by mouth daily., Disp: , Rfl:  .  predniSONE (DELTASONE) 10 MG tablet, Take by mouth., Disp: , Rfl:  .  Respiratory Therapy Supplies (FLUTTER) DEVI, Use as directed, Disp: 1 each, Rfl: 0 .  testosterone cypionate (DEPOTESTOSTERONE CYPIONATE) 200 MG/ML injection, INJECT 1 ML (200 MG TOTAL) INTO THE MUSCLE EVERY 14 (FOURTEEN) DAYS., Disp: , Rfl: 0 .  traZODone (DESYREL) 50 MG tablet, Take 1 tablet by mouth at bedtime as needed., Disp: , Rfl:    Review of Systems     Objective:   Physical Exam Vitals:   04/25/17 1023  BP: 138/80  Pulse: 64  SpO2: 94%  Weight: 171 lb (77.6 kg)  Height: 6' 1.5" (1.867 m)    Estimated body mass index is 22.25 kg/m as calculated from the following:   Height as of this encounter: 6' 1.5" (1.867 m).   Weight as of this encounter: 171 lb (77.6 kg).     Assessment:       ICD-10-CM   1. Stage 2 moderate COPD by GOLD classification (HCC) J44.9        Plan:      Continue trelegy daily Use  albuterol as needed Good idea to visit Dr Woody Seller to understand syncope over new year's eve  Followup - 6 months or sooner if needed    Dr. Brand Males, M.D., [facility].C.P Pulmonary and Critical Care Medicine Staff Physician, [place] Director - Interstitial Lung Disease  Program  Pulmonary [place] [facility] [place], [place], [address]  Pager: [phone], If no answer or between  15:00h - 7:00h: call [phone] Telephone: [phone]

## 2017-07-25 ENCOUNTER — Telehealth: Payer: Self-pay | Admitting: Adult Health

## 2017-07-25 MED ORDER — FLUTICASONE-UMECLIDIN-VILANT 100-62.5-25 MCG/INH IN AEPB: 1.0000 | INHALATION_SPRAY | Freq: Every day | RESPIRATORY_TRACT | 0 refills | Status: AC

## 2017-07-25 MED ORDER — FLUTICASONE-UMECLIDIN-VILANT 100-62.5-25 MCG/INH IN AEPB: 1.0000 | INHALATION_SPRAY | Freq: Every day | RESPIRATORY_TRACT | 1 refills | Status: DC

## 2017-07-25 NOTE — Telephone Encounter (Signed)
Received fax from [facility]:  PHARMACY COMMENTS: Alternative requested: Rx is too early to fill on pt's insurance until 08/11/17 because they require a 90-D/S and have quantity limit of 180.  Pt already picked up box in February.  Pt needs something to hold them over until next Rx due  Will provide patient with 2 Trelegy samples to hold him until his next Rx is able to be filled and will call [facility] to change Rx to 90 day supply  Called spoke with patient's spouse [name] (dpr on file) and discussed the above.  [name] did mention that a 90 day supply will cost patient $300 but declined changing therapy because the Trelegy is working so well for patient.  Advised [name] will place 2 samples of Trelegy up front for patient to pick up at his convenience.  He is currently out of medication.  Called [facility] and spoke with Estill Batten - informed her that pt is being supplied with enough samples until he can pick up next Rx and gave verbal authorization for 90 day supply.    Samples documented per protocol Nothing further needed; will sign off

## 2017-08-14 ENCOUNTER — Telehealth: Payer: Self-pay | Admitting: Internal Medicine

## 2017-08-14 NOTE — Telephone Encounter (Signed)
Called and spoke to patient. Patient wanted to know which inhalers he has tried and failed so that he will be able to get Trelegy covered by the [place]. Reviewed with patient previous inhalers he has been on. Patient stated that he will get back in touch with the [place] with the list of medications he has failed. Patient reports there is nothing further needed at this time.

## 2017-08-29 ENCOUNTER — Telehealth: Payer: Self-pay | Admitting: Internal Medicine

## 2017-08-29 MED ORDER — UMECLIDINIUM-VILANTEROL 62.5-25 MCG/INH IN AEPB: 1.0000 | INHALATION_SPRAY | Freq: Every day | RESPIRATORY_TRACT | 0 refills | Status: DC

## 2017-08-29 MED ORDER — FLUTICASONE FUROATE 100 MCG/ACT IN AEPB: 1.0000 | INHALATION_SPRAY | Freq: Every day | RESPIRATORY_TRACT | 0 refills | Status: DC

## 2017-08-29 NOTE — Telephone Encounter (Signed)
Recommend he come to opffice and take 1 month of samples of (anoro + arnuity) OR ( incruse + low dose breo) and during this time should not take trelegy and he can see if there is a difference and then we can decide

## 2017-08-29 NOTE — Telephone Encounter (Signed)
Called and spoke with patient, he states that his doctor from the [place] is not wanting him to take the Trelegy inhaler. He is wanting to prescribe him each individual inhaler that makes up the Trelegy. This doctor states that it will better benefit the patient. Patient states that he wants MR opinion on this.    MR please advise, thank you.

## 2017-08-29 NOTE — Telephone Encounter (Signed)
Called pt and advised message from the provider. Pt understood and verbalized understanding. Nothing further is needed.   Samples of Arnuity and Anoro up front for pt to pick up.

## 2017-09-05 ENCOUNTER — Ambulatory Visit (INDEPENDENT_AMBULATORY_CARE_PROVIDER_SITE_OTHER): Payer: Medicare Other | Admitting: Adult Health

## 2017-09-05 ENCOUNTER — Encounter: Payer: Self-pay | Admitting: Adult Health

## 2017-09-05 DIAGNOSIS — J441 Chronic obstructive pulmonary disease with (acute) exacerbation: Secondary | ICD-10-CM | POA: Diagnosis not present

## 2017-09-05 MED ORDER — PREDNISONE 10 MG PO TABS: ORAL_TABLET | ORAL | 0 refills | Status: DC

## 2017-09-05 MED ORDER — AZITHROMYCIN 250 MG PO TABS: ORAL_TABLET | ORAL | 0 refills | Status: AC

## 2017-09-05 NOTE — Patient Instructions (Signed)
Zpack take as directed  Prednisone taper over next week.  Continue on TRELEGY  daily .  Continue on Mucinex DM Twice daily   Use Tessalon Three times a day  For cough .  Use Flonase 2 puffs daily At bedtime   Use  Zyrtec 10mg  At bedtime   Follow up with Dr. Chase Caller 2 months and As needed   Please contact office for sooner follow up if symptoms do not improve or worsen or seek emergency care

## 2017-09-05 NOTE — Assessment & Plan Note (Signed)
Exacerbation  Plan  Patient Instructions  Zpack take as directed  Prednisone taper over next week.  Continue on TRELEGY  daily .  Continue on Mucinex DM Twice daily   Use Tessalon Three times a day  For cough .  Use Flonase 2 puffs daily At bedtime   Use  Zyrtec 10mg  At bedtime   Follow up with Dr. Chase Caller 2 months and As needed   Please contact office for sooner follow up if symptoms do not improve or worsen or seek emergency care

## 2017-09-05 NOTE — Progress Notes (Signed)
@Patient  ID: Jeremiah [name], male    DOB: [birth year], 72 y.o.   MRN: [id number]  Chief Complaint  Patient presents with  . Follow-up    COPD     Referring provider: Selena Lesser, MD  HPI: 72 yo male former smoker followed for GOLD III COPD    TEST  PFTs 10/12/2015FEV1 1.30 (35%) ratio 49 with severe air trapping and no better p saba and dlco 48% -- referred to rehab 01/24/14  -Med calendar 02/21/2014>did not recognize it when reprinted 03/29/14 >reviewed , 08/03/2014, 04/28/2015 CT chest 02/2016 no ILD , emphysema changes   09/05/2017 Acute OV : COPD  Patient presents for an acute office visit.  Patient complains over the last week that he has had increased cough congestion thick mucus wheezing and increased shortness of breath. He remains on Trelegy daily.  Denies any fever, chest pain, orthopnea, edema Has been using Mucinex without much relief.  Cough is aggravating and keeping him awake at night.  Denies any reflux symptoms. Appetite is  good with no nausea vomiting or diarrhea  No Known Allergies  Immunization History  Administered Date(s) Administered  . Influenza Split 01/13/2013  . Influenza, High Dose Seasonal PF 12/28/2014, 01/15/2016, 01/13/2017  . Influenza,inj,Quad PF,6+ Mos 01/24/2014  . Pneumococcal-Unspecified 04/15/2010  . Tdap 12/28/2014    Past Medical History:  Diagnosis Date  . COPD (chronic obstructive pulmonary disease) ([place])   . Depression   . Hypertension   . PTSD (post-traumatic stress disorder)   . Sleep apnea     Tobacco History: Social History   Tobacco Use  Smoking Status Former Smoker  . Packs/day: 1.00  . Years: 30.00  . Pack years: 30.00  . Types: Cigarettes  . Last attempt to quit: 04/16/1987  . Years since quitting: 30.4  Smokeless Tobacco Never Used   Counseling given: Not Answered   Outpatient Encounter Medications as of 09/05/2017  Medication Sig  . albuterol (PROVENTIL HFA;VENTOLIN HFA) 108 (90 Base)  MCG/ACT inhaler Inhale 2 puffs into the lungs every 4 (four) hours as needed for wheezing or shortness of breath (((PLAN B))).  [name] Kitchen albuterol (PROVENTIL) (2.5 MG/3ML) 0.083% nebulizer solution Take 3 mLs by nebulization every 4 (four) hours as needed for wheezing or shortness of breath (((PLAN C))).   [name] Kitchen ALPRAZolam (XANAX) 0.5 MG tablet TAKE 1 TABLET BY MOUTH TWICE A DAY AS NEEDED FOR ANXIETY  . aspirin 325 MG tablet Take 325 mg by mouth daily.  . B-D 3CC LUER-LOK SYR 21GX1-1/2 21G X 1-1/2" 3 ML MISC USE NEEDLE/SYRINGE TO INJECT TESTOSTERONE INTO SKIN ONCE EVERY 2 WEEKS.  . dextromethorphan-guaiFENesin (MUCINEX DM) 30-600 MG per 12 hr tablet Take 35ml every 4 hours as needed with flutter valve for cough and congestion  . erythromycin ophthalmic ointment Place into both eyes nightly.  . esomeprazole (NEXIUM) 40 MG capsule Take 40 mg by mouth daily before breakfast.   . famotidine (PEPCID) 20 MG tablet Take 20 mg by mouth at bedtime.  . fluticasone (FLONASE) 50 MCG/ACT nasal spray Place 2 sprays into both nostrils 2 (two) times daily.  . Fluticasone-Umeclidin-Vilant (TRELEGY ELLIPTA) 100-62.5-25 MCG/INH AEPB Inhale 1 puff into the lungs daily.  . magnesium oxide (MAG-OX) 400 MG tablet Take 400 mg by mouth 2 (two) times daily. For leg cramps  . Multiple Vitamin (CALCIUM COMPLEX PO) Take by mouth. Take 3 tablets in am  . pravastatin (PRAVACHOL) 10 MG tablet Take 10 mg by mouth daily.  [name] Kitchen Respiratory Therapy Supplies (FLUTTER) DEVI  Use as directed  . testosterone cypionate (DEPOTESTOSTERONE CYPIONATE) 200 MG/ML injection INJECT 1 ML (200 MG TOTAL) INTO THE MUSCLE EVERY 14 (FOURTEEN) DAYS.  [name] Kitchen traZODone (DESYREL) 50 MG tablet Take 1 tablet by mouth at bedtime as needed.  [name] Kitchen azithromycin (ZITHROMAX Z-PAK) 250 MG tablet Take 2 tablets (500 mg) on  Day 1,  followed by 1 tablet (250 mg) once daily on Days 2 through 5.  . Fluticasone Furoate (ARNUITY ELLIPTA) 100 MCG/ACT AEPB Inhale 1 puff into the lungs daily.  (Patient not taking: Reported on 09/05/2017)  . predniSONE (DELTASONE) 10 MG tablet 4 tabs for 2 days, then 3 tabs for 2 days, 2 tabs for 2 days, then 1 tab for 2 days, then stop  . umeclidinium-vilanterol (ANORO ELLIPTA) 62.5-25 MCG/INH AEPB Inhale 1 puff into the lungs daily. (Patient not taking: Reported on 09/05/2017)   No facility-administered encounter medications on file as of 09/05/2017.      Review of Systems  Constitutional:   No  weight loss, night sweats,  Fevers, chills, fatigue, or  lassitude.  HEENT:   No headaches,  Difficulty swallowing,  Tooth/dental problems, or  Sore throat,                No sneezing, itching, ear ache,  +nasal congestion, post nasal drip,   CV:  No chest pain,  Orthopnea, PND, swelling in lower extremities, anasarca, dizziness, palpitations, syncope.   GI  No heartburn, indigestion, abdominal pain, nausea, vomiting, diarrhea, change in bowel habits, loss of appetite, bloody stools.   Resp:  No chest wall deformity  Skin: no rash or lesions.  GU: no dysuria, change in color of urine, no urgency or frequency.  No flank pain, no hematuria   MS:  No joint pain or swelling.  No decreased range of motion.  No back pain.    Physical Exam  BP 102/60 (BP Location: Left Arm, Cuff Size: Normal)   Pulse 79   Ht 6\' 1"  (1.854 m)   Wt 170 lb 3.2 oz (77.2 kg)   SpO2 96%   BMI 22.46 kg/m   GEN: A/Ox3; pleasant , NAD, thin male , elderly    HEENT:  [place]/AT,  EACs-clear, TMs-wnl, NOSE-clear, THROAT-clear, no lesions, no postnasal drip or exudate noted.   NECK:  Supple w/ fair ROM; no JVD; normal carotid impulses w/o bruits; no thyromegaly or nodules palpated; no lymphadenopathy.    RESP  Few trace rhonchi   no accessory muscle use, no dullness to percussion  CARD:  RRR, no m/r/g, no peripheral edema, pulses intact, no cyanosis or clubbing.  GI:   Soft & nt; nml bowel sounds; no organomegaly or masses detected.   Musco: Warm bil, no deformities or  joint swelling noted.   Neuro: alert, no focal deficits noted.    Skin: Warm, no lesions or rashes    Lab Results:  CBC  BNP No results found for: BNP   Imaging: No results found.   Assessment & Plan:   COPD, frequent exacerbations ([place]) Exacerbation  Plan  Patient Instructions  Zpack take as directed  Prednisone taper over next week.  Continue on TRELEGY  daily .  Continue on Mucinex DM Twice daily   Use Tessalon Three times a day  For cough .  Use Flonase 2 puffs daily At bedtime   Use  Zyrtec 10mg  At bedtime   Follow up with Dr. Chase Caller 2 months and As needed   Please contact office for sooner follow up  if symptoms do not improve or worsen or seek emergency care          Rexene Edison, NP 09/05/2017

## 2017-09-09 ENCOUNTER — Ambulatory Visit: Payer: Medicare Other | Admitting: Internal Medicine

## 2017-10-20 ENCOUNTER — Other Ambulatory Visit: Payer: Self-pay | Admitting: Adult Health

## 2017-10-24 ENCOUNTER — Telehealth: Payer: Self-pay | Admitting: Adult Health

## 2017-10-24 MED ORDER — BENZONATATE 100 MG PO CAPS: 200.0000 mg | ORAL_CAPSULE | Freq: Three times a day (TID) | ORAL | 3 refills | Status: DC | PRN

## 2017-10-24 NOTE — Telephone Encounter (Signed)
Called spoke with patient who is requesting a refill on his Tessalon 200mg  Pt denies any acute symptoms Last office visit 5.24.19 with TP Upcoming office visit 7.24.19 with MR  Pharmacy verified with patient Directions changed to match AVS from 5.24.19 visit with TP and telephoned to [facility], spoke with [place] list updated Nothing further needed at this time; will sign off

## 2017-11-05 ENCOUNTER — Ambulatory Visit (INDEPENDENT_AMBULATORY_CARE_PROVIDER_SITE_OTHER): Payer: Medicare Other | Admitting: Internal Medicine

## 2017-11-05 ENCOUNTER — Encounter: Payer: Self-pay | Admitting: Internal Medicine

## 2017-11-05 VITALS — BP 132/78 | HR 57 | Ht 73.0 in | Wt 169.6 lb

## 2017-11-05 DIAGNOSIS — Z87891 Personal history of nicotine dependence: Secondary | ICD-10-CM | POA: Diagnosis not present

## 2017-11-05 DIAGNOSIS — J449 Chronic obstructive pulmonary disease, unspecified: Secondary | ICD-10-CM | POA: Diagnosis not present

## 2017-11-05 NOTE — Patient Instructions (Addendum)
ICD-10-CM   1. Stage 2 moderate COPD by GOLD classification ([place]) J44.9     Stable and glad youa re doing well  Plan Continue trelegy daily   - please note anoro  + arnuity taken together is like taking trelegy but respect you prefer trelegy Use albuterol as needed Flu shot in fall Refer lung cancer screening program  Followup 9 months or sooner if needed

## 2017-11-05 NOTE — Progress Notes (Signed)
Subjective:     Patient ID: Jeremiah [name], male   DOB: [birth year], 72 y.o.   MRN: [id number]  HPI  HPI  Brief patient profile:  3 yobm quit smoking 1989 with no problems with variable sob x 2004 and maintained on inhalers referred 05/03/2013 by Dr Titus Mould for evaluation of outpt management for copd after hospitalization and found to have GOLD III COPD 01/2014  with tendency to panic when sob.  Admit date: 04/24/2013  Discharge date: 04/27/2013  Discharge Diagnoses:  Active Problems:  COPD exacerbation  Detailed Hospital Course:  72 yo male with abrupt onset of sob (1/10) that progressively worsened until EMS called. Patient intubated in ED due to severe respiratory distress. In route, patient had increasing tachypnea, decline SpO2, and obtundation. Patient with GCS <8 at arrival, evidence of emesis, intubated for severe respiratory distress. Family reported no evidence of fevers, chills, malaise, worsening coughing, and sob. Initial episode passed, however sister received a call later with him reporting a return of symptoms, complaints of inability of breathing and EMS alerted. Distant history of tobacco abuse. Has never been hospitalized for COPD before this episode. He has had no recent hospitalizations, and has never been previously intubated.  He was admitted to the ICU on 1/11 and was treated with the typical modalities for acute respiratory failure (for presumed AECODP) which included mechanical ventilation with sedation, inhaled bronchodilators, antibiotics, and systemic steroid therapy. He improved later that day and was able to be extubated and sedation was stopped. His care since that point has primarily focused on the de-escalation of the therapies mentioned above. On 1/13 he has returned to a near-baseline state of health and has been deemed a candidate for discharge.  Discharge Plan by diagnoses  Acute exacerbation of COPD  Acute hypercarbic respiratory failure (resolved)  plan   -Follow up with Dr. Melvyn Novas on 05/03/2013 at 2:30PM  -Change steroids to PO Prednisone, taper over 7-10d  -Resume home dose BDs    History of Present Illness  05/03/2013 1st [facility] office visit/ Wert cc more sob since Oct 2014 on symbicort 160 2 bid and spiriva daily  But still  daily use of proventil and no need for neb then needed neb x sev months prior to his admit above but back to baseline since d/c on pred still tapering off but using saba qid, not prn "out of habit" >>return for PFT   01/03/2014 Follow up  [name] .  Complains of increased episodes of increased DOE, wheezing, prod cough with white/clear mucus worse for last week. Family member says he has been progressively get worse over last year. Was admitted 04/2013 w/ COPD flare /VDRF . [name] from last office visit however did not follow up .   Followed at [facility] , says he was told he has Moderate COPD 6 yrs ago. On Symbicort and Spiriva.  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  Says he gets winded easily esp on incline.  Says worse for last week with increased cough and wheezing.  No hemotpysis, wt loss, calf pain, n/v/d, chest pain or fever.  Last abx ~2 months ago.  rec Doxycycline 100mg  Twice daily  For 7 days  Prednisone taper over next week Mucinex DM Twice daily  As needed  Cough /congestion  Continue on Symbicort and Spiriva .    01/24/2014 f/u ov/Wert re:  GOLD III Chief Complaint  Patient presents with  . Follow-up    PFT done today. Pt states that SOB and cough have improved some, but not back to normal baseline. Cough is prod with minimal clear sputum.   can do HT but no mall due to sob Prednisone really helped a lot but did not maintain improvement once finished >>pred taper add ppi bid and diet     12/12/2014 acute  ov/Wert re: ? aecopd /new gi/gu complaints ? From spiriva?   Chief  Complaint  Patient presents with  . Acute Visit    Pt c/o increased SOB and cough x 4 days. Cough is not very prod- clear sputum. He also c/o increased gas and bloating.   downhill since prev ov, changing his own maint rx (dc'd pepcid) >  severe cough at hs onset 12/09/14  On spiriva lots of gi/gu issues  Started pred one day prior to OV  As per calendar action plan  Has only used saba so far in hfa form, has neb but doesn't feel he needs it yet.  >changed spiriva to [place]     04/04/2015  f/u ov/Wert re: copd  Chief Complaint  Patient presents with  . Follow-up    pt following for COPD: pt states hes been its been on and off. pt states 3 weekws ago he was diagnosed with bronchitits but he feels much better. pt c/o a dry cough thats been bothering him mostly in the night time. pt states at times he has to take the CPAP off because he has these couhing fits. no c/o SOB , wheezing, or chest tightness.   has med calendar but not following the action plans at the bottom for cough  > For cough/ congestion > mucinex dm and flutter valve       07/20/2015  f/u ov/Wert re: aecopd/ using med calendar though not flutter or approp dose of mucinex dm/ confused with when to use pred  Chief Complaint  Patient presents with  . Follow-up    pt c/o nonprod cough, chest congestion, sob X4-5 days.  S/s worse qhs.    last prednisone finished on 07/16/15 and getting worse again in terms of breathing and dry coughing rec In the event that you need your nebulizer more than rarely for your breathing: prednisone 20 mg daily until better  Then 10 mg daily x 5 days and stop For cough > mucinex dm 1200 mg every 12 hours and use the flutter valve Work on inhaler technique    08/29/2015  f/u ov/Wert re:  GOLD III  symbicort 160 2bid/ tudorza  Chief Complaint  Patient presents with  . Follow-up    Cough has not improved since the last visit. He uses ventolin at least once per day and neb with albuterol 2 x daily  on average.   Still using oil based vit D, following med calendar better / rarely taking prednisone but helps when does  Lots of hoarseness and dry cough  Day >   >stop [place] and begin Spiriva     11/01/2015 NP  Acute OV  Presents for an acute office visit Patient complains of 2 weeks of increased cough, shortness of breath and wheezing. Feels that the high temperatures and humidity or making his breathing worse. Wears out with minimum activity   Denies any chest congestion/tightness, sinus pressure/drainage, fever, nausea or vomiting.  Has any fever or discolored mucus. Remains on Symbicort and Spiriva. Did start on prednisone over  the last 1-2 days. Still has persistent cough and wheezing. Cough is keeping him up at night.. Patient and wife have multiple questions about his severity of his underlying COPD. He went over purse lip breathing. He would like to try pulmonary rehabilitation again. rec Zpack to have on hold if symtpoms worsen with discolored mucus.  Prednisone taper over next week.  Hydromet 1 tsp every 6hr, As needed  Cough , may make sleepy.  Refer to pulmonary rehab    12/25/2015  f/u ov/Wert re: GOLD III copd/ symb/spiriva - using med calendar well  Chief Complaint  Patient presents with  . Follow-up    Doing well and denies any co's today.   doe [facility] = can't walk a nl pace on a flat grade s sob but does fine slow and flat eg  WM shopping ok p using [facility] parking - rare saba need -has not needed pred recently main complaint is abd distention/ constipation  rec Try spiriva just one puff each am to see if bloating/constipation improve  and if so leave the dose just at one pff - if not ok try off completely after a few weeks of one pff but if off it makes no difference at all then just restart it at 2 each am as per med calendar  See calendar for specific medication instructions    01/17/2016 acute extended ov/Wert re: GOLD III/ aecopd  Chief Complaint  Patient  presents with  . Acute Visit    Pt c/o cough x 5 days- prod with min clear sputum.  He had nosebleed last night.    severe cough onset 10/1 > min white mucus, worse at hs  Confused with names of meds and correlating them with med calendar though did bring it with him  Did not previously disclose use of tessalon but says now he was using it prn and worked fine until it ran out  Note note now on tenoretic, also not on med calendar  rec For dry cough as per med calendar > tessalon pearls 200 mg every 4-6 hours as needed and use the flutter valve For congested cough > mucinex dm up to 1200  Every 12 hours and use the flutter valve also  Increase the spiriva back to 2 puffs each am  See calendar for specific medication instructions   01/25/2016 acute extended ov/Wert re: copd gold III/ chronic dry cough  Chief Complaint  Patient presents with  . Acute Visit    Pt. c/o coughing on going  for couple of months, frequent coughing spills, some clear mucus, some sob, some wheezing, some chest pain after having coughing spells   improving ex tol at rehab s 02 Cough is worse around 3 am nightly x months but no excess mucus/ just dry hack ? Really using pepcid at hs as per med calendar  Has pred to take for flare of sob but not cough so has not tried it yet  >>pred taper , changed atenolol to bisoprolol    02/12/2016 Acute OV : COPD  He says he is feeling some better but has not gotten over his cough yet.  He started on tessalon last ov which is helping some.  Seen 2 weeks ago , given prednisone taper and change from atenolol to bisoprolol.  Chest xray last ov with copd changes , no acute process.  He denies chest pain , orthopnea, edema or fever.  Cough is mainly dry , sometimes worse at night .  Him  and his wife are worried why the cough does not go away .     OV 03/22/2016  Chief Complaint  Patient presents with  . Follow-up    Changing from MW to MR. Pt here after PFT. Pt c/o dry cough  and SOB at baseline. Pt denies CP/tightness and f/c/s.     72 year old male presents with his wife. They've been married for 2 years. In 2015 he had significant life-threatening COPD exacerbation. Since then been on triple inhaler therapy. Currently stable. He is doing pulmonary rehabilitation maintenance phase of the second time. Overall he tells me that his major complains of moderate degree of cough and shortness of breath and wheezing. The cough is essentially dry. At pulmonary rehabilitation he does not desaturate. He has been doing this for one month. He'll finish pulmonary rehabilitation and another one month. Wife's main concern is that he's having frequent COPD exacerbations. According to her at least 5 times he's had COPD exacerbation requiring prednisone and antibiotic of both in the year 2017. Most recently was 2 weeks ago. Emotions trigger it. The house does not have any pets or bad carpet or mold or mildew or cockroach.  He underwent Pulmi function testing today and compared to 2015 he is actually improved to go stage II COPD. Previously was Gold stage III COPD. He had high resolution CT chest that shows coronary artery calcification for which I do not see any evidence of cardiac workup. He has emphysema but no evidence of interstitial lung disease or cancer.  Currently he does well. It is noted that he is on testosterone shots  OV 05/17/2016  Chief Complaint  Patient presents with  . Follow-up    2 mo f/u. Breathing has been the same last visit. Denies any SOB    Follow-up Gold stage II COPD. He presents with his wife. In the past used to be Gold stage III COPD but when we repeated pulmonary function testing is probably function test that actually improved to Gold stage II in December 2017 [see below]. His main issues that he has severe chronic cough. They're also worried about recurrent COPD exacerbations. In reviewing the chart and in talking to him it appears that the cough is severe  and rated as a score of 4/5 .and is present all the time associated with thick white mucus. He also has some dyspnea. His cough is so significant that he has made some phone calls about it and he gets treated for COPD exacerbation with prednisone and antibiotic which actually helped his cough. Therefore he believes that he has recurrent COPD exacerbations. But on taking the history closely this cough is stable and is still at a level IV/V. He is frustrated by it. He denies it is clearing of the throat although in the office he is cleared his throat. His wife is wondering about other etiologies be on COPD this contributing to this cough. There is associated dyspnea that is significant. Immediate appears that these are all unchanged compared to his last visit in December 2017. But they're definitely bothering him. He did tell me that the Brio helped him. Of note he does have some postnasal drainage and review of the chart suggests that he's never had sinus CT  Of note at this time he is not interested in either roflumilast or partipation in aecopd prevention research trial     IMPRESSION: 1. No evidence of interstitial lung disease. 2. Moderate centrilobular emphysema and mild diffuse bronchial wall thickening, consistent with  the provided history of COPD. 3. Small parenchymal bands in the posterior basilar bilateral lower lobes, most consistent with mild postinfectious/postinflammatory scarring. 4. Aortic atherosclerosis. Ectatic ascending thoracic aorta, maximum diameter 4.1 cm. Recommend annual imaging followup by CTA or MRA. This recommendation follows 2010 ACCF/AHA/AATS/ACR/ASA/SCA/SCAI/SIR/STS/SVM Guidelines for the Diagnosis and Management of Patients with Thoracic Aortic Disease. Circulation. 2010; 121: B353-G992. 5. Three-vessel coronary atherosclerosis.   Electronically Signed   By: Ilona Sorrel M.D.   On: 02/20/2016 13:59    Results for Jeremiah [name], Jeremiah [name] (MRN [id number]) as of  03/22/2016 11:11  Ref. Range 01/24/2014 10:51 03/22/2016 09:59  FVC-Post Latest Units: L 2.75 3.53  FVC-%Pred-Post Latest Units: % 56 84  FVC-%Change-Post Latest Units: % 3 5  FEV1-Post Latest Units: L 1.26 1.79  FEV1-%Pred-Post Latest Units: % 34 56  Results for Jeremiah [name], Jeremiah [name] (MRN [id number]) as of 03/22/2016 11:11  Ref. Range 01/24/2014 10:51  DLCO unc Latest Units: ml/min/mmHg 17.16  DLCO unc % pred Latest Units: % 48   OV 06/20/2016  Chief Complaint  Patient presents with  . Follow-up    Pt states he is doing well; has cough with yellow mucus. Pt had CT maxillofacial done 05/23/16 and would like results.       Follow-up moderate COPD. He has history of recurrent exacerbations. Last visit limited CT sinus and the report suggested possible chronic sinusitis. I referred him to ENT. He  he saw Dr. Constance Holster in ENT and was reassured. They were even told according to the history that CT sinus was normal. They're asking for CT sinus report. Overall he is doing well. He is not using 3% saline nebulizer as advised him. He is using his Spiriva and Brio. There are no new issues. COPD cat score is 23 and is similar to baseline   OV 01/13/2017  Chief Complaint  Patient presents with  . Follow-up    Pt states that he has been doing good. Pt states that he has realized he has been coughing more than usual. Occ SOB. Denies any CP.    Follow-up moderate COPD. He has a history of recurrent exacerbations. Currently on Spiriva and Brio and Singulair daily. This is a routine follow-up. He tells me that he had another exacerbation and finished a 10 day prednisone 10 days ago. He is close to baseline. He still has some coughing. He is wondering about daily prednisone after talking to a church member. He is willing to have a flu shot today. Otherwise no new complaints other than the fact that if any recurrent exacerbations over which is frustrated.  01/21/2017 Acute OV : Cough  Complains of 1 week of increased  cough , mainly dry in nature. No wheezing. Was seen 1 week ago , changed to [place] . Feels this is helping and making breathing better. Does not feel  Sick. No fever, discolored mucus, chest pain , orthopnea, edema. Does have some post nasal drainage , ticking in throat. Uses mint cough drops to help . Has some nasal stuffiness. Using Mucinex DM As needed  Cough . Cough is aggravating , interrupts is social activities. No GERD sx .    OV 04/25/2017  Chief Complaint  Patient presents with  . Follow-up    Pt had recent hospitalization at [facility] in [place], and was there 1 night before they left. States that he is doing good now and has no complaints of cough, SOB, orCP.   72 year old with Gold stage III COPD that changed to Gold stage II  COPD after inhaler treatment.  He is now on trilogy inhaler.  Overall COPD stable.  In fact after starting trilogy inhaler his COPD CAT score is improved to 17.  He is here with his daughter.  The only issue is that on New Year's Eve that he had a vasovagal syncope episode and was taken to [facility] ER.  That his heart rate was 51 which is his baseline but they try to admit him and he walked out AMA.  It appears that he might have had some postural hypotension related to Viagra intake.  He has appointment with Dr. Woody Seller cardiologist pending.   09/05/2017 Acute OV : COPD  Patient presents for an acute office visit.  Patient complains over the last week that he has had increased cough congestion thick mucus wheezing and increased shortness of breath. He remains on Trelegy daily.  Denies any fever, chest pain, orthopnea, edema Has been using Mucinex without much relief.  Cough is aggravating and keeping him awake at night.  Denies any reflux symptoms. Appetite is  good with no nausea vomiting or diarrhea   OV 11/05/2017  Chief Complaint  Patient presents with  . Follow-up    Pt states he has been doing well since last visit and denies any complaints.    Follow-up Gold stage II COPD.  Presents for routine follow-up. Last seen by myself in January 2019 and then in May 2019*nurse practitioner for COPD exacerbation. At this point in time he continues on TRELEGY ellipta inhaler and he is feeling great. COPD cat score is documented below with symptoms severe deep. It is at its lowest at 14. He is being around $55 for this. He has [facility] benefit and they want to give him anoro + arnuity for free but he feels that he is better off with trelegy and does not want to change. Review of the chart shows he is 30 pack smoking history quit 30 years ago most recent CT chest was in November 2017 without any nodules.    CAT COPD Symptom & Quality of Life Score (GSK trademark) 0 is no burden. 5 is highest burden 03/22/2016  06/20/2016  04/25/2017  11/05/2017 trelegy  Never Cough -> Cough all the time 4 3 4 3   No phlegm in chest -> Chest is full of phlegm 1 2 2 2   No chest tightness -> Chest feels very tight 1 0 0 2  No dyspnea for 1 flight stairs/hill -> Very dyspneic for 1 flight of stairs 4 3 2 1   No limitations for ADL at home -> Very limited with ADL at home 4 3 3  0  Confident leaving home -> Not at all confident leaving home 0 5 2 0  Sleep soundly -> Do not sleep soundly because of lung condition 0 4 3 3   Lots of Energy -> No energy at all 3 3 1 3   TOTAL Score (max 40)  17 23 17 14     TEST   PFTs 01/24/2014  FEV1  1.30 (35%) ratio 49 with severe air trapping and no better p saba and dlco 48% -- referred to rehab 01/24/14  -Med calendar 02/21/2014 > did not recognize it when reprinted 03/29/14 > reviewed , 08/03/2014 , 04/28/2015   CT chest 02/2016 no ILD , emphysema changes       has a past medical history of COPD (chronic obstructive pulmonary disease) ([place]), Depression, Hypertension, PTSD (post-traumatic stress disorder), and Sleep apnea.   reports that he quit smoking about  30 years ago. His smoking use included cigarettes. He has a 30.00 pack-year  smoking history. He has never used smokeless tobacco.  Past Surgical History:  Procedure Laterality Date  . HAMMER TOE SURGERY  April 2017    No Known Allergies  Immunization History  Administered Date(s) Administered  . Influenza Split 01/13/2013  . Influenza, High Dose Seasonal PF 12/28/2014, 01/15/2016, 01/13/2017  . Influenza,inj,Quad PF,6+ Mos 01/24/2014  . Pneumococcal-Unspecified 04/15/2010  . Tdap 12/28/2014    Family History  Problem Relation Age of Onset  . Asthma Maternal Grandmother   . Heart disease Father   . Dementia Mother      Current Outpatient Medications:  .  albuterol (PROVENTIL HFA;VENTOLIN HFA) 108 (90 Base) MCG/ACT inhaler, Inhale 2 puffs into the lungs every 4 (four) hours as needed for wheezing or shortness of breath (((PLAN B)))., Disp: 1 Inhaler, Rfl: 5 .  albuterol (PROVENTIL) (2.5 MG/3ML) 0.083% nebulizer solution, Take 3 mLs by nebulization every 4 (four) hours as needed for wheezing or shortness of breath (((PLAN C))). , Disp: , Rfl:  .  ALPRAZolam (XANAX) 0.5 MG tablet, TAKE 1 TABLET BY MOUTH TWICE A DAY AS NEEDED FOR ANXIETY, Disp: 60 tablet, Rfl: 1 .  aspirin EC 81 MG tablet, Take 81 mg by mouth daily., Disp: , Rfl:  .  B-D 3CC LUER-LOK SYR 21GX1-1/2 21G X 1-1/2" 3 ML MISC, USE NEEDLE/SYRINGE TO INJECT TESTOSTERONE INTO SKIN ONCE EVERY 2 WEEKS., Disp: , Rfl: 0 .  benzonatate (TESSALON) 100 MG capsule, Take 2 capsules (200 mg total) by mouth 3 (three) times daily as needed for cough., Disp: 45 capsule, Rfl: 3 .  dextromethorphan-guaiFENesin (MUCINEX DM) 30-600 MG per 12 hr tablet, Take 38ml every 4 hours as needed with flutter valve for cough and congestion, Disp: , Rfl:  .  erythromycin ophthalmic ointment, Place into both eyes nightly., Disp: , Rfl:  .  esomeprazole (NEXIUM) 40 MG capsule, Take 40 mg by mouth daily before breakfast. , Disp: , Rfl: 2 .  famotidine (PEPCID) 20 MG tablet, Take 20 mg by mouth at bedtime., Disp: , Rfl:  .   fluticasone (FLONASE) 50 MCG/ACT nasal spray, Place 2 sprays into both nostrils 2 (two) times daily., Disp: , Rfl:  .  Fluticasone-Umeclidin-Vilant (TRELEGY ELLIPTA) 100-62.5-25 MCG/INH AEPB, Inhale 1 puff into the lungs daily., Disp: 3 each, Rfl: 1 .  magnesium oxide (MAG-OX) 400 MG tablet, Take 400 mg by mouth 2 (two) times daily. For leg cramps, Disp: , Rfl:  .  Multiple Vitamin (CALCIUM COMPLEX PO), Take by mouth. Take 3 tablets in am, Disp: , Rfl:  .  pravastatin (PRAVACHOL) 10 MG tablet, Take 10 mg by mouth daily., Disp: , Rfl:  .  Respiratory Therapy Supplies (FLUTTER) DEVI, Use as directed, Disp: 1 each, Rfl: 0 .  Fluticasone Furoate (ARNUITY ELLIPTA) 100 MCG/ACT AEPB, Inhale 1 puff into the lungs daily. (Patient not taking: Reported on 09/05/2017), Disp: 30 each, Rfl: 0 .  traZODone (DESYREL) 50 MG tablet, Take 1 tablet by mouth at bedtime as needed., Disp: , Rfl:  .  umeclidinium-vilanterol (ANORO ELLIPTA) 62.5-25 MCG/INH AEPB, Inhale 1 puff into the lungs daily. (Patient not taking: Reported on 09/05/2017), Disp: 120 each, Rfl: 0   Review of Systems     Objective:   Physical Exam  Constitutional: He is oriented to person, place, and time. He appears well-developed and well-nourished. No distress.  HENT:  Head: Normocephalic and atraumatic.  Right Ear: External ear normal.  Left  Ear: External ear normal.  Mouth/Throat: Oropharynx is clear and moist. No oropharyngeal exudate.  Eyes: Pupils are equal, round, and reactive to light. Conjunctivae and EOM are normal. Right eye exhibits no discharge. Left eye exhibits no discharge. No scleral icterus.  Neck: Normal range of motion. Neck supple. No JVD present. No tracheal deviation present. No thyromegaly present.  Cardiovascular: Normal rate, regular rhythm and intact distal pulses. Exam reveals no gallop and no friction rub.  No murmur heard. Pulmonary/Chest: Effort normal and breath sounds normal. No respiratory distress. He has no  wheezes. He has no rales. He exhibits no tenderness.  Abdominal: Soft. Bowel sounds are normal. He exhibits no distension and no mass. There is no tenderness. There is no rebound and no guarding.  Musculoskeletal: Normal range of motion. He exhibits no edema or tenderness.  Varicose veins +  Lymphadenopathy:    He has no cervical adenopathy.  Neurological: He is alert and oriented to person, place, and time. He has normal reflexes. No cranial nerve deficit. Coordination normal.  Skin: Skin is warm and dry. No rash noted. He is not diaphoretic. No erythema. No pallor.  Psychiatric: He has a normal mood and affect. His behavior is normal. Judgment and thought content normal.  Nursing note and vitals reviewed.   Vitals:   11/05/17 0910  BP: 132/78  Pulse: (!) 57  SpO2: 92%  Weight: 169 lb 9.6 oz (76.9 kg)  Height: 6\' 1"  (1.854 m)    Estimated body mass index is 22.38 kg/m as calculated from the following:   Height as of this encounter: 6\' 1"  (1.854 m).   Weight as of this encounter: 169 lb 9.6 oz (76.9 kg).     Assessment:       ICD-10-CM   1. Stage 2 moderate COPD by GOLD classification (HCC) J44.9        Plan:      Stable and glad youa re doing well  Plan Continue trelegy daily   - please note anoro  + arnuity taken together is like taking trelegy but respect you prefer trelegy Use albuterol as needed Flu shot in fall Refer lung cancer screening program  Followup 9 months or sooner if needed     Dr. Brand Males, M.D., [facility].C.P Pulmonary and Critical Care Medicine Staff Physician, [place] Director - Interstitial Lung Disease  Program  Pulmonary [place] [facility] [place], [place], [address]  Pager: [phone], If no answer or between  15:00h - 7:00h: call [phone] Telephone: [phone]

## 2017-11-05 NOTE — Addendum Note (Signed)
Addended by: Jannette Spanner on: 11/05/2017 10:17 AM   Modules accepted: Orders

## 2017-11-14 ENCOUNTER — Telehealth: Payer: Self-pay | Admitting: Internal Medicine

## 2017-11-14 NOTE — Telephone Encounter (Signed)
FYI for MR:  PT does not qualify for lung cancer screening due to quit smoking in 1989.  PT aware referral has been cancelled.

## 2018-02-10 ENCOUNTER — Telehealth: Payer: Self-pay | Admitting: Internal Medicine

## 2018-02-10 MED ORDER — FLUTICASONE-UMECLIDIN-VILANT 100-62.5-25 MCG/INH IN AEPB: 1.0000 | INHALATION_SPRAY | Freq: Every day | RESPIRATORY_TRACT | 0 refills | Status: DC

## 2018-02-10 MED ORDER — PREDNISONE 10 MG PO TABS: ORAL_TABLET | ORAL | 0 refills | Status: DC

## 2018-02-10 MED ORDER — ALBUTEROL SULFATE (2.5 MG/3ML) 0.083% IN NEBU: 3.0000 mL | INHALATION_SOLUTION | RESPIRATORY_TRACT | 0 refills | Status: DC | PRN

## 2018-02-10 MED ORDER — NEBULIZER COMPRESSOR KIT: PACK | 0 refills | Status: DC

## 2018-02-10 MED ORDER — DOXYCYCLINE HYCLATE 100 MG PO TABS: 100.0000 mg | ORAL_TABLET | Freq: Two times a day (BID) | ORAL | 0 refills | Status: DC

## 2018-02-10 NOTE — Telephone Encounter (Signed)
Pt wife calling stating that pt is out town and having trouble breathing  needs meds asap called  Wyvonnia Dusky  [phone]  Wife can be reache @ [phone].Hillery Hunter

## 2018-02-10 NOTE — Telephone Encounter (Signed)
Spoke with pt's wife, she stated pt had a flare up and they are in [place]. They did call 911 and they gave him an albuterol treatment on site. They would like to have something called in for his COPD. She also needs a new nebulizer and they can purchase one from the pharmacy for 50 dollars. She is requesting prednisone and albuterol to be sent in by 11:00 because the pharmacy can deliver it to them. They are in an AirBnb and want to prevent him from going into the hospital because they want to come back to [facility] tomorrow. MR Please advise.   Current Outpatient Medications on File Prior to Visit  Medication Sig Dispense Refill  . albuterol (PROVENTIL HFA;VENTOLIN HFA) 108 (90 Base) MCG/ACT inhaler Inhale 2 puffs into the lungs every 4 (four) hours as needed for wheezing or shortness of breath (((PLAN B))). 1 Inhaler 5  . albuterol (PROVENTIL) (2.5 MG/3ML) 0.083% nebulizer solution Take 3 mLs by nebulization every 4 (four) hours as needed for wheezing or shortness of breath (((PLAN C))).     [name] Kitchen ALPRAZolam (XANAX) 0.5 MG tablet TAKE 1 TABLET BY MOUTH TWICE A DAY AS NEEDED FOR ANXIETY 60 tablet 1  . aspirin EC 81 MG tablet Take 81 mg by mouth daily.    . B-D 3CC LUER-LOK SYR 21GX1-1/2 21G X 1-1/2" 3 ML MISC USE NEEDLE/SYRINGE TO INJECT TESTOSTERONE INTO SKIN ONCE EVERY 2 WEEKS.  0  . benzonatate (TESSALON) 100 MG capsule Take 2 capsules (200 mg total) by mouth 3 (three) times daily as needed for cough. 45 capsule 3  . dextromethorphan-guaiFENesin (MUCINEX DM) 30-600 MG per 12 hr tablet Take 39ml every 4 hours as needed with flutter valve for cough and congestion    . erythromycin ophthalmic ointment Place into both eyes nightly.    . esomeprazole (NEXIUM) 40 MG capsule Take 40 mg by mouth daily before breakfast.   2  . famotidine (PEPCID) 20 MG tablet Take 20 mg by mouth at bedtime.    . fluticasone (FLONASE) 50 MCG/ACT nasal spray Place 2 sprays into both nostrils 2 (two) times daily.    .  Fluticasone Furoate (ARNUITY ELLIPTA) 100 MCG/ACT AEPB Inhale 1 puff into the lungs daily. (Patient not taking: Reported on 09/05/2017) 30 each 0  . Fluticasone-Umeclidin-Vilant (TRELEGY ELLIPTA) 100-62.5-25 MCG/INH AEPB Inhale 1 puff into the lungs daily. 3 each 1  . magnesium oxide (MAG-OX) 400 MG tablet Take 400 mg by mouth 2 (two) times daily. For leg cramps    . Multiple Vitamin (CALCIUM COMPLEX PO) Take by mouth. Take 3 tablets in am    . pravastatin (PRAVACHOL) 10 MG tablet Take 10 mg by mouth daily.    [name] Kitchen Respiratory Therapy Supplies (FLUTTER) DEVI Use as directed 1 each 0  . traZODone (DESYREL) 50 MG tablet Take 1 tablet by mouth at bedtime as needed.    . umeclidinium-vilanterol (ANORO ELLIPTA) 62.5-25 MCG/INH AEPB Inhale 1 puff into the lungs daily. (Patient not taking: Reported on 09/05/2017) 120 each 0   No current facility-administered medications on file prior to visit.    No Known Allergies

## 2018-02-10 NOTE — Telephone Encounter (Signed)
Pt wife cheryl called back and wanted to make sure that there treglogy got called in as well  [name] that she forgot to mention this to you.Jeremiah [name]

## 2018-02-10 NOTE — Telephone Encounter (Signed)
Spoke with wife, advised message from MR. I sent in Rx's for Doxy, prednisone, albuterol nebs, nebulizer, and Trelegy to [facility] in [place].. Nothing further is needed.

## 2018-02-10 NOTE — Telephone Encounter (Signed)
  calll in   trelegy  Take prednisone 40 mg daily x 2 days, then 20mg  daily x 2 days, then 10mg  daily x 2 days, then 5mg  daily x 2 days and stop  And also  Take doxycycline 100mg  po twice daily x 5 days; take after meals and avoid sunlight

## 2018-02-16 ENCOUNTER — Encounter: Payer: Self-pay | Admitting: Adult Health

## 2018-02-16 ENCOUNTER — Ambulatory Visit (INDEPENDENT_AMBULATORY_CARE_PROVIDER_SITE_OTHER)
Admission: RE | Admit: 2018-02-16 | Discharge: 2018-02-16 | Disposition: A | Discharge status: Home / Self Care | Payer: Medicare Other | Source: Ambulatory Visit | Attending: Adult Health | Admitting: Adult Health

## 2018-02-16 ENCOUNTER — Ambulatory Visit (INDEPENDENT_AMBULATORY_CARE_PROVIDER_SITE_OTHER): Payer: Medicare Other | Admitting: Adult Health

## 2018-02-16 VITALS — BP 124/72 | Temp 98.0°F | Ht 73.5 in | Wt 174.4 lb

## 2018-02-16 DIAGNOSIS — J441 Chronic obstructive pulmonary disease with (acute) exacerbation: Secondary | ICD-10-CM | POA: Diagnosis not present

## 2018-02-16 MED ORDER — DOXYCYCLINE HYCLATE 100 MG PO TABS: 100.0000 mg | ORAL_TABLET | Freq: Two times a day (BID) | ORAL | 0 refills | Status: AC

## 2018-02-16 MED ORDER — LEVALBUTEROL HCL 0.63 MG/3ML IN NEBU
0.6300 mg | INHALATION_SOLUTION | Freq: Once | RESPIRATORY_TRACT | Status: AC
  Administered 2018-02-16: 0.63 mg via RESPIRATORY_TRACT

## 2018-02-16 MED ORDER — METHYLPREDNISOLONE ACETATE 80 MG/ML IJ SUSP
120.0000 mg | Freq: Once | INTRAMUSCULAR | Status: AC
  Administered 2018-02-16: 120 mg via INTRAMUSCULAR

## 2018-02-16 MED ORDER — PREDNISONE 10 MG PO TABS: ORAL_TABLET | ORAL | 0 refills | Status: DC

## 2018-02-16 MED ORDER — FLUTICASONE-UMECLIDIN-VILANT 100-62.5-25 MCG/INH IN AEPB: 1.0000 | INHALATION_SPRAY | Freq: Every day | RESPIRATORY_TRACT | 0 refills | Status: DC

## 2018-02-16 NOTE — Assessment & Plan Note (Addendum)
Slow to resolve flare  Check chest xray today  Depo medrol 120mg  IM x 1   Plan  Patient Instructions  Extend Doxycycline for 3 days  Extend Prednisone  20mg  daily for 5 days then 10mg  daily for 5 days and stop .  Chest xray today  Continue on TRELEGY  daily .  Continue on Mucinex DM Twice daily   Follow up with Dr. Chase Caller or Jenita Rayfield in 2 weeks  and As needed   Please contact office for sooner follow up if symptoms do not improve or worsen or seek emergency care

## 2018-02-16 NOTE — Progress Notes (Signed)
$'@Patient'N$  ID: Jeremiah [name], male    DOB: 17-Jul-1945, 72 y.o.   MRN: [id number]  Chief Complaint  Patient presents with  . Acute Visit    COPD     Referring provider: Selena Lesser, MD  HPI: 72  yo male former smoker followed for GOLD III COPD   TEST PFTs 10/12/2015FEV1 1.30 (35%) ratio 49 with severe air trapping and no better p saba and dlco 48% -- referred to rehab 01/24/14  -Med calendar 02/21/2014>did not recognize it when reprinted 03/29/14 >reviewed , 08/03/2014, 04/28/2015 CT chest 02/2016 no ILD , emphysema changes  02/16/2018 Acute OV : COPD  Patient presents for an acute office visit.  Complains over the last week he has had increased cough congestion and wheezing.  He was called in doxycycline and prednisone and says that it is starting to work.  Continues to cough up some thick mucus.  He denies any fever chest pain orthopnea PND or increased leg swelling. Remains on Trelegy daily.    No Known Allergies  Immunization History  Administered Date(s) Administered  . Influenza Split 01/13/2013  . Influenza, High Dose Seasonal PF 12/28/2014, 01/15/2016, 01/13/2017, 01/16/2018  . Influenza,inj,Quad PF,6+ Mos 01/24/2014  . Pneumococcal-Unspecified 04/15/2010  . Tdap 12/28/2014    Past Medical History:  Diagnosis Date  . COPD (chronic obstructive pulmonary disease) ([place])   . Depression   . Hypertension   . PTSD (post-traumatic stress disorder)   . Sleep apnea     Tobacco History: Social History   Tobacco Use  Smoking Status Former Smoker  . Packs/day: 1.00  . Years: 30.00  . Pack years: 30.00  . Types: Cigarettes  . Last attempt to quit: 04/16/1987  . Years since quitting: 30.8  Smokeless Tobacco Never Used   Counseling given: Not Answered   Outpatient Medications Prior to Visit  Medication Sig Dispense Refill  . albuterol (PROVENTIL HFA;VENTOLIN HFA) 108 (90 Base) MCG/ACT inhaler Inhale 2 puffs into the lungs every 4 (four) hours as  needed for wheezing or shortness of breath (((PLAN B))). 1 Inhaler 5  . albuterol (PROVENTIL) (2.5 MG/3ML) 0.083% nebulizer solution Take 3 mLs by nebulization every 4 (four) hours as needed for wheezing or shortness of breath (((PLAN C))). 75 mL 0  . ALPRAZolam (XANAX) 0.5 MG tablet TAKE 1 TABLET BY MOUTH TWICE A DAY AS NEEDED FOR ANXIETY 60 tablet 1  . aspirin EC 81 MG tablet Take 81 mg by mouth daily.    . B-D 3CC LUER-LOK SYR 21GX1-1/2 21G X 1-1/2" 3 ML MISC USE NEEDLE/SYRINGE TO INJECT TESTOSTERONE INTO SKIN ONCE EVERY 2 WEEKS.  0  . benzonatate (TESSALON) 100 MG capsule Take 2 capsules (200 mg total) by mouth 3 (three) times daily as needed for cough. 45 capsule 3  . dextromethorphan-guaiFENesin (MUCINEX DM) 30-600 MG per 12 hr tablet Take 2m every 4 hours as needed with flutter valve for cough and congestion    . doxycycline (VIBRA-TABS) 100 MG tablet Take 1 tablet (100 mg total) by mouth 2 (two) times daily. 14 tablet 0  . erythromycin ophthalmic ointment Place into both eyes nightly.    . esomeprazole (NEXIUM) 40 MG capsule Take 40 mg by mouth daily before breakfast.   2  . famotidine (PEPCID) 20 MG tablet Take 20 mg by mouth at bedtime.    . fluticasone (FLONASE) 50 MCG/ACT nasal spray Place 2 sprays into both nostrils 2 (two) times daily.    . Fluticasone Furoate (ARNUITY ELLIPTA) 100 MCG/ACT  AEPB Inhale 1 puff into the lungs daily. 30 each 0  . Fluticasone-Umeclidin-Vilant (TRELEGY ELLIPTA) 100-62.5-25 MCG/INH AEPB Inhale 1 puff into the lungs daily. 28 each 0  . magnesium oxide (MAG-OX) 400 MG tablet Take 400 mg by mouth 2 (two) times daily. For leg cramps    . Multiple Vitamin (CALCIUM COMPLEX PO) Take by mouth. Take 3 tablets in am    . pravastatin (PRAVACHOL) 10 MG tablet Take 10 mg by mouth daily.    . predniSONE (DELTASONE) 10 MG tablet Take 4x2, 2x2, 1x2, then 0.5 x2, then stop 15 tablet 0  . Respiratory Therapy Supplies (FLUTTER) DEVI Use as directed 1 each 0  . Respiratory  Therapy Supplies (NEBULIZER COMPRESSOR) KIT Use as directed DX: J44.9 1 each 0  . traZODone (DESYREL) 50 MG tablet Take 1 tablet by mouth at bedtime as needed.    . Fluticasone-Umeclidin-Vilant (TRELEGY ELLIPTA) 100-62.5-25 MCG/INH AEPB Inhale 1 puff into the lungs daily. 3 each 1  . umeclidinium-vilanterol (ANORO ELLIPTA) 62.5-25 MCG/INH AEPB Inhale 1 puff into the lungs daily. (Patient not taking: Reported on 09/05/2017) 120 each 0   No facility-administered medications prior to visit.      Review of Systems  Constitutional:   No  weight loss, night sweats,  Fevers, chills, fatigue, or  lassitude.  HEENT:   No headaches,  Difficulty swallowing,  Tooth/dental problems, or  Sore throat,                No sneezing, itching, ear ache,  +nasal congestion, post nasal drip,   CV:  No chest pain,  Orthopnea, PND, swelling in lower extremities, anasarca, dizziness, palpitations, syncope.   GI  No heartburn, indigestion, abdominal pain, nausea, vomiting, diarrhea, change in bowel habits, loss of appetite, bloody stools.   Resp:  .  No chest wall deformity  Skin: no rash or lesions.  GU: no dysuria, change in color of urine, no urgency or frequency.  No flank pain, no hematuria   MS:  No joint pain or swelling.  No decreased range of motion.  No back pain.    Physical Exam  BP 124/72 (BP Location: Left Arm, Cuff Size: Normal)   Temp 98 F (36.7 C) (Oral)   Ht 6' 1.5" (1.867 m)   Wt 174 lb 6.4 oz (79.1 kg)   SpO2 97%   BMI 22.70 kg/m   GEN: A/Ox3; pleasant , NAD,    HEENT:  [place]/AT,  EACs-clear, TMs-wnl, NOSE-clear, THROAT-clear, no lesions, no postnasal drip or exudate noted.   NECK:  Supple w/ fair ROM; no JVD; normal carotid impulses w/o bruits; no thyromegaly or nodules palpated; no lymphadenopathy.    RESP  Clear  P & A; w/o, wheezes/ rales/ or rhonchi. no accessory muscle use, no dullness to percussion  CARD:  RRR, no m/r/g, no peripheral edema, pulses intact, no cyanosis  or clubbing.  GI:   Soft & nt; nml bowel sounds; no organomegaly or masses detected.   Musco: Warm bil, no deformities or joint swelling noted.   Neuro: alert, no focal deficits noted.    Skin: Warm, no lesions or rashes    Lab Results:  CBC    Component Value Date/Time   WBC 9.0 03/27/2014 1525   RBC 5.49 03/27/2014 1525   HGB 16.7 03/27/2014 1525   HCT 49.5 03/27/2014 1525   PLT 277 03/27/2014 1525   MCV 90.2 03/27/2014 1525   MCH 30.4 03/27/2014 1525   MCHC 33.7 03/27/2014 1525  RDW 13.3 03/27/2014 1525    BMET    Component Value Date/Time   NA 135 (L) 03/27/2014 1552   K 3.5 (L) 03/27/2014 1552   CL 96 03/27/2014 1552   CO2 24 03/27/2014 1552   GLUCOSE 124 (H) 03/27/2014 1552   BUN 11 03/27/2014 1552   CREATININE 0.95 03/27/2014 1552   CALCIUM 10.0 03/27/2014 1552   GFRNONAA 85 (L) 03/27/2014 1552   GFRAA >90 03/27/2014 1552    BNP No results found for: BNP  ProBNP    Component Value Date/Time   PROBNP <5.0 04/24/2013 2243    Imaging: Dg Chest 2 View  Result Date: 02/16/2018 CLINICAL DATA:  72 year old male with increased cough for 2 weeks. EXAM: CHEST - 2 VIEW COMPARISON:  01/21/2017 chest radiographs and earlier. FINDINGS: Chronic large lung volumes with emphysema. Mediastinal contours are stable and within normal limits. Visualized tracheal air column is within normal limits. No pneumothorax, pulmonary edema, pleural effusion or confluent pulmonary opacity. Incidental bilateral nipple shadows. Chronic posterior left 5th rib fracture. No acute osseous abnormality identified. Negative visible bowel gas pattern IMPRESSION: Emphysema (ICD10-J43.9).  No acute cardiopulmonary abnormality. Electronically Signed   By: Genevie Ann M.D.   On: 02/16/2018 12:49    levalbuterol (XOPENEX) nebulizer solution 0.63 mg    Date Action Dose Route User   02/16/2018 1247 Given 0.63 mg Nebulization Parke Poisson E, CMA    methylPREDNISolone acetate (DEPO-MEDROL) injection  120 mg    Date Action Dose Route User   02/16/2018 1247 Given 120 mg Intramuscular (Left Upper Outer Quadrant) Rinaldo Ratel, CMA      PFT Results Latest Ref Rng & Units 03/22/2016 01/24/2014  FVC-Pre L 3.33 2.66  FVC-Predicted Pre % 79 54  FVC-Post L 3.53 2.75  FVC-Predicted Post % 84 56  Pre FEV1/FVC % % 55 49  Post FEV1/FCV % % 51 46  FEV1-Pre L 1.82 1.30  FEV1-Predicted Pre % 57 35  DLCO UNC% % - 48  DLCO COR %Predicted % - 60  TLC L - 11.28  TLC % Predicted % - 151  RV % Predicted % - 306    No results found for: NITRICOXIDE      Assessment & Plan:   COPD, frequent exacerbations (HCC) Slow to resolve flare  Check chest xray today  Depo medrol '120mg'$  IM x 1   Plan  Patient Instructions  Extend Doxycycline for 3 days  Extend Prednisone  '20mg'$  daily for 5 days then '10mg'$  daily for 5 days and stop .  Chest xray today  Continue on TRELEGY  daily .  Continue on Mucinex DM Twice daily   Follow up with Dr. Chase Caller or Dennys Guin in 2 weeks  and As needed   Please contact office for sooner follow up if symptoms do not improve or worsen or seek emergency care          Rexene Edison, NP 02/16/2018

## 2018-02-16 NOTE — Patient Instructions (Signed)
Extend Doxycycline for 3 days  Extend Prednisone  20mg  daily for 5 days then 10mg  daily for 5 days and stop .  Chest xray today  Continue on TRELEGY  daily .  Continue on Mucinex DM Twice daily   Follow up with Dr. Chase Caller or Monick Rena in 2 weeks  and As needed   Please contact office for sooner follow up if symptoms do not improve or worsen or seek emergency care

## 2018-02-20 NOTE — Progress Notes (Signed)
Called spoke with patient's spouse.  Advised of cxr results / recs as stated by TP.  Spouse verbalized understanding and denied any questions.

## 2018-03-02 ENCOUNTER — Ambulatory Visit (INDEPENDENT_AMBULATORY_CARE_PROVIDER_SITE_OTHER): Payer: Medicare Other | Admitting: Adult Health

## 2018-03-02 ENCOUNTER — Encounter: Payer: Self-pay | Admitting: Adult Health

## 2018-03-02 DIAGNOSIS — J310 Chronic rhinitis: Secondary | ICD-10-CM

## 2018-03-02 DIAGNOSIS — J441 Chronic obstructive pulmonary disease with (acute) exacerbation: Secondary | ICD-10-CM | POA: Diagnosis not present

## 2018-03-02 MED ORDER — BENZONATATE 100 MG PO CAPS: 200.0000 mg | ORAL_CAPSULE | Freq: Three times a day (TID) | ORAL | 3 refills | Status: DC | PRN

## 2018-03-02 NOTE — Patient Instructions (Addendum)
Continue on TRELEGY  daily . Brush/rinse after use.  Continue on Mucinex DM Twice daily  -use with flutter valve .  Try Flonase 2 puffs daily As needed  Nasal congestion .  Zyrtec 10mg  At bedtime  As needed  Drainage.  Continue on Nexium 40mg  daily  Continue on Pepcid 20mg  At bedtime  .  Tessalon Three times a day  As needed  Cough .  Follow up with Dr. Chase Caller or Delorean Knutzen in 2 months and As needed   Please contact office for sooner follow up if symptoms do not improve or worsen or seek emergency care   (will need to pick up refill on Tessalon ) (May need to buy Flonase , Zyrtec and Mucinex DM  )

## 2018-03-02 NOTE — Progress Notes (Signed)
$'@Patient'f$  ID: Jeremiah [name], male    DOB: [birth year], 72 y.o.   MRN: [id number]  Chief Complaint  Patient presents with  . Follow-up    COPD     Referring provider: Selena Lesser, MD  HPI: 72  yo male former smoker followed for GOLD III COPD   TEST/EVENTS :  TEST PFTs 10/12/2015FEV1 1.30 (35%) ratio 49 with severe air trapping and no better p saba and dlco 48% -- referred to rehab 01/24/14 -completed -Med calendar 02/21/2014>did not recognize it when reprinted 03/29/14 >reviewed , 08/03/2014, 04/28/2015 CT chest 02/2016 no ILD , emphysema changes  03/02/2018 Follow up : COPD  Patient presents for a 2-week follow-up.  Patient has underlying severe COPD.  He remains on Trelegy daily.  Last visit was having a slow to resolve COPD exacerbation.  He was given an extension of his doxycycline and prednisone.  Says since last visit he is feeling better cough has decreased and congestion is clear now.  He denies any wheezing or increased shortness of breath. Patient does have some sinus drainage and stuffiness.  Chest x-ray last visit showed COPD changes without acute process.     No Known Allergies  Immunization History  Administered Date(s) Administered  . Influenza Split 01/13/2013  . Influenza, High Dose Seasonal PF 12/28/2014, 01/15/2016, 01/13/2017, 01/16/2018  . Influenza,inj,Quad PF,6+ Mos 01/24/2014  . Pneumococcal-Unspecified 04/15/2010  . Tdap 12/28/2014    Past Medical History:  Diagnosis Date  . COPD (chronic obstructive pulmonary disease) ([place])   . Depression   . Hypertension   . PTSD (post-traumatic stress disorder)   . Sleep apnea     Tobacco History: Social History   Tobacco Use  Smoking Status Former Smoker  . Packs/day: 1.00  . Years: 30.00  . Pack years: 30.00  . Types: Cigarettes  . Last attempt to quit: 04/16/1987  . Years since quitting: 30.8  Smokeless Tobacco Never Used   Counseling given: Not Answered   Outpatient  Medications Prior to Visit  Medication Sig Dispense Refill  . albuterol (PROVENTIL HFA;VENTOLIN HFA) 108 (90 Base) MCG/ACT inhaler Inhale 2 puffs into the lungs every 4 (four) hours as needed for wheezing or shortness of breath (((PLAN B))). 1 Inhaler 5  . albuterol (PROVENTIL) (2.5 MG/3ML) 0.083% nebulizer solution Take 3 mLs by nebulization every 4 (four) hours as needed for wheezing or shortness of breath (((PLAN C))). 75 mL 0  . ALPRAZolam (XANAX) 0.5 MG tablet TAKE 1 TABLET BY MOUTH TWICE A DAY AS NEEDED FOR ANXIETY 60 tablet 1  . aspirin EC 81 MG tablet Take 81 mg by mouth daily.    . B-D 3CC LUER-LOK SYR 21GX1-1/2 21G X 1-1/2" 3 ML MISC USE NEEDLE/SYRINGE TO INJECT TESTOSTERONE INTO SKIN ONCE EVERY 2 WEEKS.  0  . dextromethorphan-guaiFENesin (MUCINEX DM) 30-600 MG per 12 hr tablet Take 70m every 4 hours as needed with flutter valve for cough and congestion    . erythromycin ophthalmic ointment Place into both eyes nightly.    . esomeprazole (NEXIUM) 40 MG capsule Take 40 mg by mouth daily before breakfast.   2  . famotidine (PEPCID) 20 MG tablet Take 20 mg by mouth at bedtime.    . fluticasone (FLONASE) 50 MCG/ACT nasal spray Place 2 sprays into both nostrils 2 (two) times daily.    . Fluticasone-Umeclidin-Vilant (TRELEGY ELLIPTA) 100-62.5-25 MCG/INH AEPB Inhale 1 puff into the lungs daily. 28 each 0  . magnesium oxide (MAG-OX) 400 MG tablet Take 400 mg  by mouth 2 (two) times daily. For leg cramps    . Multiple Vitamin (CALCIUM COMPLEX PO) Take by mouth. Take 3 tablets in am    . pravastatin (PRAVACHOL) 10 MG tablet Take 10 mg by mouth daily.    . predniSONE (DELTASONE) 10 MG tablet '20mg'$  daily x5 days, then '10mg'$  daily x5 days and stop. 15 tablet 0  . Respiratory Therapy Supplies (FLUTTER) DEVI Use as directed 1 each 0  . Respiratory Therapy Supplies (NEBULIZER COMPRESSOR) KIT Use as directed DX: J44.9 1 each 0  . traZODone (DESYREL) 50 MG tablet Take 1 tablet by mouth at bedtime as needed.     . benzonatate (TESSALON) 100 MG capsule Take 2 capsules (200 mg total) by mouth 3 (three) times daily as needed for cough. 45 capsule 3  . doxycycline (VIBRA-TABS) 100 MG tablet Take 1 tablet (100 mg total) by mouth 2 (two) times daily. (Patient not taking: Reported on 03/02/2018) 14 tablet 0  . Fluticasone Furoate (ARNUITY ELLIPTA) 100 MCG/ACT AEPB Inhale 1 puff into the lungs daily. (Patient not taking: Reported on 03/02/2018) 30 each 0  . Fluticasone-Umeclidin-Vilant (TRELEGY ELLIPTA) 100-62.5-25 MCG/INH AEPB Inhale 1 puff into the lungs daily. 28 each 0  . predniSONE (DELTASONE) 10 MG tablet Take 4x2, 2x2, 1x2, then 0.5 x2, then stop (Patient not taking: Reported on 03/02/2018) 15 tablet 0   No facility-administered medications prior to visit.      Review of Systems  Constitutional:   No  weight loss, night sweats,  Fevers, chills, fatigue, or  lassitude.  HEENT:   No headaches,  Difficulty swallowing,  Tooth/dental problems, or  Sore throat,                No sneezing, itching, ear ache, + nasal congestion, post nasal drip,   CV:  No chest pain,  Orthopnea, PND, swelling in lower extremities, anasarca, dizziness, palpitations, syncope.   GI  No heartburn, indigestion, abdominal pain, nausea, vomiting, diarrhea, change in bowel habits, loss of appetite, bloody stools.   Resp:  .  No chest wall deformity  Skin: no rash or lesions.  GU: no dysuria, change in color of urine, no urgency or frequency.  No flank pain, no hematuria   MS:  No joint pain or swelling.  No decreased range of motion.  No back pain.    Physical Exam  BP 118/64 (BP Location: Left Arm, Cuff Size: Normal)   Pulse (!) 56   Ht 6' 1.5" (1.867 m)   Wt 164 lb 6.4 oz (74.6 kg)   SpO2 95%   BMI 21.40 kg/m   GEN: A/Ox3; pleasant , NAD, thin and elderly    HEENT:  [place]/AT,  EACs-clear, TMs-wnl, NOSE-clear, THROAT-clear, no lesions, no postnasal drip or exudate noted.   NECK:  Supple w/ fair ROM; no JVD;  normal carotid impulses w/o bruits; no thyromegaly or nodules palpated; no lymphadenopathy.    RESP  Clear  P & A; w/o, wheezes/ rales/ or rhonchi. no accessory muscle use, no dullness to percussion  CARD:  RRR, no m/r/g, no peripheral edema, pulses intact, no cyanosis or clubbing.  GI:   Soft & nt; nml bowel sounds; no organomegaly or masses detected.   Musco: Warm bil, no deformities or joint swelling noted.   Neuro: alert, no focal deficits noted.    Skin: Warm, no lesions or rashes    Lab Results:  CBC    Component Value Date/Time   WBC 9.0 03/27/2014 1525  RBC 5.49 03/27/2014 1525   HGB 16.7 03/27/2014 1525   HCT 49.5 03/27/2014 1525   PLT 277 03/27/2014 1525   MCV 90.2 03/27/2014 1525   MCH 30.4 03/27/2014 1525   MCHC 33.7 03/27/2014 1525   RDW 13.3 03/27/2014 1525    BMET    Component Value Date/Time   NA 135 (L) 03/27/2014 1552   K 3.5 (L) 03/27/2014 1552   CL 96 03/27/2014 1552   CO2 24 03/27/2014 1552   GLUCOSE 124 (H) 03/27/2014 1552   BUN 11 03/27/2014 1552   CREATININE 0.95 03/27/2014 1552   CALCIUM 10.0 03/27/2014 1552   GFRNONAA 85 (L) 03/27/2014 1552   GFRAA >90 03/27/2014 1552    BNP No results found for: BNP  ProBNP    Component Value Date/Time   PROBNP <5.0 04/24/2013 2243    Imaging: Dg Chest 2 View  Result Date: 02/16/2018 CLINICAL DATA:  72 year old male with increased cough for 2 weeks. EXAM: CHEST - 2 VIEW COMPARISON:  01/21/2017 chest radiographs and earlier. FINDINGS: Chronic large lung volumes with emphysema. Mediastinal contours are stable and within normal limits. Visualized tracheal air column is within normal limits. No pneumothorax, pulmonary edema, pleural effusion or confluent pulmonary opacity. Incidental bilateral nipple shadows. Chronic posterior left 5th rib fracture. No acute osseous abnormality identified. Negative visible bowel gas pattern IMPRESSION: Emphysema (ICD10-J43.9).  No acute cardiopulmonary abnormality.  Electronically Signed   By: Genevie Ann M.D.   On: 02/16/2018 12:49    levalbuterol (XOPENEX) nebulizer solution 0.63 mg    Date Action Dose Route User   02/16/2018 1247 Given 0.63 mg Nebulization Parke Poisson E, CMA    methylPREDNISolone acetate (DEPO-MEDROL) injection 120 mg    Date Action Dose Route User   02/16/2018 1247 Given 120 mg Intramuscular (Left Upper Outer Quadrant) Rinaldo Ratel, CMA      PFT Results Latest Ref Rng & Units 03/22/2016 01/24/2014  FVC-Pre L 3.33 2.66  FVC-Predicted Pre % 79 54  FVC-Post L 3.53 2.75  FVC-Predicted Post % 84 56  Pre FEV1/FVC % % 55 49  Post FEV1/FCV % % 51 46  FEV1-Pre L 1.82 1.30  FEV1-Predicted Pre % 57 35  FEV1-Post L 1.79 1.26  DLCO UNC% % - 48  DLCO COR %Predicted % - 60  TLC L - 11.28  TLC % Predicted % - 151  RV % Predicted % - 306    No results found for: NITRICOXIDE      Assessment & Plan:   COPD, frequent exacerbations (HCC) Recent exacerbation now resolving  Control for triggers  Plan  Patient Instructions  Continue on TRELEGY  daily . Brush/rinse after use.  Continue on Mucinex DM Twice daily  -use with flutter valve .  Try Flonase 2 puffs daily As needed  Nasal congestion .  Zyrtec '10mg'$  At bedtime  As needed  Drainage.  Continue on Nexium '40mg'$  daily  Continue on Pepcid '20mg'$  At bedtime  .  Tessalon Three times a day  As needed  Cough .  Follow up with Dr. Chase Caller or Ardelle [place] in 2 months and As needed   Please contact office for sooner follow up if symptoms do not improve or worsen or seek emergency care   (will need to pick up refill on Tessalon ) (May need to buy Flonase , Zyrtec and Mucinex DM  )       Chronic rhinitis Add zyrtec and flonase As needed       [employer],  NP 03/02/2018

## 2018-03-02 NOTE — Assessment & Plan Note (Signed)
Recent exacerbation now resolving  Control for triggers  Plan  Patient Instructions  Continue on TRELEGY  daily . Brush/rinse after use.  Continue on Mucinex DM Twice daily  -use with flutter valve .  Try Flonase 2 puffs daily As needed  Nasal congestion .  Zyrtec 10mg  At bedtime  As needed  Drainage.  Continue on Nexium 40mg  daily  Continue on Pepcid 20mg  At bedtime  .  Tessalon Three times a day  As needed  Cough .  Follow up with Dr. Chase Caller or [place] in 2 months and As needed   Please contact office for sooner follow up if symptoms do not improve or worsen or seek emergency care   (will need to pick up refill on Tessalon ) (May need to buy Flonase , Zyrtec and Mucinex DM  )

## 2018-03-02 NOTE — Assessment & Plan Note (Signed)
Add zyrtec and flonase As needed

## 2018-04-01 ENCOUNTER — Telehealth: Payer: Self-pay | Admitting: Internal Medicine

## 2018-04-01 MED ORDER — ALBUTEROL SULFATE HFA 108 (90 BASE) MCG/ACT IN AERS: 2.0000 | INHALATION_SPRAY | RESPIRATORY_TRACT | 6 refills | Status: DC | PRN

## 2018-04-01 NOTE — Telephone Encounter (Signed)
Called and spoke with pt. Pt stated come January 2020, the Proventil HFA will no longer be covered by insurance. Alternatives are albuterol sulfate or levalbuterol tartrate.  MR, please advise which inhaler you want to switch pt to. Thanks!

## 2018-04-01 NOTE — Telephone Encounter (Signed)
Per Dr. Chase Caller- Ok to change to Albuterol Sulfate.  Called and spoke with Patient's wife, [name].  Recommendation given.  Understanding stated.  Prescription for Albuterol Sulfate sent to Patient preferred pharmacy.  Nothing further at this time.

## 2018-04-01 NOTE — Telephone Encounter (Signed)
Do albuterol sulfate

## 2018-04-14 ENCOUNTER — Telehealth: Payer: Self-pay | Admitting: Internal Medicine

## 2018-04-14 ENCOUNTER — Ambulatory Visit (INDEPENDENT_AMBULATORY_CARE_PROVIDER_SITE_OTHER): Payer: Medicare Other | Admitting: Nurse Practitioner

## 2018-04-14 ENCOUNTER — Encounter: Payer: Self-pay | Admitting: Nurse Practitioner

## 2018-04-14 VITALS — BP 136/80 | HR 77 | Ht 73.5 in | Wt 174.8 lb

## 2018-04-14 DIAGNOSIS — J441 Chronic obstructive pulmonary disease with (acute) exacerbation: Secondary | ICD-10-CM

## 2018-04-14 MED ORDER — IPRATROPIUM-ALBUTEROL 0.5-2.5 (3) MG/3ML IN SOLN: 3.0000 mL | Freq: Four times a day (QID) | RESPIRATORY_TRACT | Status: DC

## 2018-04-14 MED ORDER — METHYLPREDNISOLONE ACETATE 80 MG/ML IJ SUSP
80.0000 mg | Freq: Once | INTRAMUSCULAR | Status: AC
  Administered 2018-04-14: 80 mg via INTRAMUSCULAR

## 2018-04-14 MED ORDER — BENZONATATE 100 MG PO CAPS: 100.0000 mg | ORAL_CAPSULE | Freq: Three times a day (TID) | ORAL | 1 refills | Status: DC

## 2018-04-14 MED ORDER — PREDNISONE 10 MG PO TABS: ORAL_TABLET | ORAL | 0 refills | Status: DC

## 2018-04-14 MED ORDER — ALBUTEROL SULFATE HFA 108 (90 BASE) MCG/ACT IN AERS: 2.0000 | INHALATION_SPRAY | RESPIRATORY_TRACT | 5 refills | Status: DC | PRN

## 2018-04-14 MED ORDER — DOXYCYCLINE HYCLATE 100 MG PO TABS: 100.0000 mg | ORAL_TABLET | Freq: Two times a day (BID) | ORAL | 0 refills | Status: DC

## 2018-04-14 MED ORDER — IPRATROPIUM-ALBUTEROL 0.5-2.5 (3) MG/3ML IN SOLN
3.0000 mL | Freq: Once | RESPIRATORY_TRACT | Status: AC
  Administered 2018-04-14: 3 mL via RESPIRATORY_TRACT

## 2018-04-14 NOTE — Progress Notes (Signed)
_0  ID: Jeremiah [name], male    DOB: 15-Mar-1946, 72 y.o.   MRN: [id number]  Chief Complaint  Patient presents with  . chest congestion    Referring provider: Selena Lesser, MD  HPI 72 year old male former smoker followed for COPD GOLD III by Dr. Chase Caller.  Tests: PFTs 10/12/2015FEV1 1.30 (35%) ratio 49 with severe air trapping and no better p saba and dlco 48% -- referred to rehab 01/24/14 -completed -Med calendar 02/21/2014>did not recognize it when reprinted 03/29/14 >reviewed , 08/03/2014, 04/28/2015 CT chest 02/2016 no ILD , emphysema changes CXR 02/16/18>> emphysema.  No acute cardiopulmonary abnormality.  OV 04/16/17 - chest congestion and shortness of breath Patient presents with chest congestion, shortness of breath, and cough.  States symptoms started around a week ago.  Symptoms have progressively worsened.  He states that cough is nonproductive.  He complains of wheezing.  Denies any recent fever.  He denies any chest pain or edema.  He has not been compliant with his inhalers.  He states that he has not been taking his Trelegy daily.  He thought that he was told just to take Trelegy as needed.  He was also out of his Proventil and is requesting a refill.      No Known Allergies  Immunization History  Administered Date(s) Administered  . Influenza Split 01/13/2013  . Influenza, High Dose Seasonal PF 12/28/2014, 01/15/2016, 01/13/2017, 01/16/2018  . Influenza,inj,Quad PF,6+ Mos 01/24/2014  . Pneumococcal Conjugate-13 12/08/2017  . Pneumococcal-Unspecified 04/15/2010  . Tdap 12/28/2014    Past Medical History:  Diagnosis Date  . COPD (chronic obstructive pulmonary disease) ([place])   . Depression   . Hypertension   . PTSD (post-traumatic stress disorder)   . Sleep apnea     Tobacco History: Social History   Tobacco Use  Smoking Status Former Smoker  . Packs/day: 1.00  . Years: 30.00  . Pack years: 30.00  . Types: Cigarettes  . Last attempt to  quit: 04/16/1987  . Years since quitting: 31.0  Smokeless Tobacco Never Used   Counseling given: Not Answered   Outpatient Encounter Medications as of 04/14/2018  Medication Sig  . albuterol (PROVENTIL HFA;VENTOLIN HFA) 108 (90 Base) MCG/ACT inhaler Inhale 2 puffs into the lungs every 4 (four) hours as needed for wheezing or shortness of breath (((PLAN B))).  [name] Kitchen ALPRAZolam (XANAX) 0.5 MG tablet TAKE 1 TABLET BY MOUTH TWICE A DAY AS NEEDED FOR ANXIETY  . aspirin EC 81 MG tablet Take 81 mg by mouth daily.  . B-D 3CC LUER-LOK SYR 21GX1-1/2 21G X 1-1/2" 3 ML MISC USE NEEDLE/SYRINGE TO INJECT TESTOSTERONE INTO SKIN ONCE EVERY 2 WEEKS.  . benzonatate (TESSALON) 100 MG capsule Take 2 capsules (200 mg total) by mouth 3 (three) times daily as needed for cough.  . dextromethorphan-guaiFENesin (MUCINEX DM) 30-600 MG per 12 hr tablet Take 96m every 4 hours as needed with flutter valve for cough and congestion  . erythromycin ophthalmic ointment Place into both eyes nightly.  . esomeprazole (NEXIUM) 40 MG capsule Take 40 mg by mouth daily before breakfast.   . famotidine (PEPCID) 20 MG tablet Take 20 mg by mouth at bedtime.  . fluticasone (FLONASE) 50 MCG/ACT nasal spray Place 2 sprays into both nostrils 2 (two) times daily.  . Fluticasone-Umeclidin-Vilant (TRELEGY ELLIPTA) 100-62.5-25 MCG/INH AEPB Inhale 1 puff into the lungs daily.  . magnesium oxide (MAG-OX) 400 MG tablet Take 400 mg by mouth 2 (two) times daily. For leg cramps  . Multiple Vitamin (  CALCIUM COMPLEX PO) Take by mouth. Take 3 tablets in am  . pravastatin (PRAVACHOL) 10 MG tablet Take 10 mg by mouth daily.  . predniSONE (DELTASONE) 10 MG tablet 37m daily x5 days, then 179mdaily x5 days and stop.  . [name] Kitchenespiratory Therapy Supplies (FLUTTER) DEVI Use as directed  . Respiratory Therapy Supplies (NEBULIZER COMPRESSOR) KIT Use as directed DX: J44.9  . traZODone (DESYREL) 50 MG tablet Take 1 tablet by mouth at bedtime as needed.  .  [DISCONTINUED] albuterol (PROVENTIL HFA;VENTOLIN HFA) 108 (90 Base) MCG/ACT inhaler Inhale 2 puffs into the lungs every 4 (four) hours as needed for wheezing or shortness of breath (((PLAN B))).  . [DISCONTINUED] albuterol (PROVENTIL HFA;VENTOLIN HFA) 108 (90 Base) MCG/ACT inhaler Inhale 2 puffs into the lungs every 4 (four) hours as needed for wheezing or shortness of breath.  . [DISCONTINUED] albuterol (PROVENTIL) (2.5 MG/3ML) 0.083% nebulizer solution Take 3 mLs by nebulization every 4 (four) hours as needed for wheezing or shortness of breath (((PLAN C))).  . benzonatate (TESSALON) 100 MG capsule Take 1 capsule (100 mg total) by mouth 3 (three) times daily.  . [name] Kitchenoxycycline (VIBRA-TABS) 100 MG tablet Take 1 tablet (100 mg total) by mouth 2 (two) times daily.  . predniSONE (DELTASONE) 10 MG tablet Take 4 tabs for 2 days, then 3 tabs for 2 days, then 2 tabs for 2 days, then 1 tab for 2 days, then stop  . [EXPIRED] ipratropium-albuterol (DUONEB) 0.5-2.5 (3) MG/3ML nebulizer solution 3 mL   . [EXPIRED] methylPREDNISolone acetate (DEPO-MEDROL) injection 80 mg   . [DISCONTINUED] ipratropium-albuterol (DUONEB) 0.5-2.5 (3) MG/3ML nebulizer solution 3 mL    No facility-administered encounter medications on file as of 04/14/2018.      Review of Systems  Review of Systems  Constitutional: Negative.  Negative for chills and fever.  HENT: Negative.  Negative for congestion.   Respiratory: Positive for cough, shortness of breath and wheezing.   Cardiovascular: Negative.  Negative for chest pain, palpitations and leg swelling.  Gastrointestinal: Negative.   Allergic/Immunologic: Negative.   Neurological: Negative.   Psychiatric/Behavioral: Negative.        Physical Exam  BP 136/80 (BP Location: Left Arm, Patient Position: Sitting, Cuff Size: Normal)   Pulse 77   Ht 6' 1.5" (1.867 m)   Wt 174 lb 12.8 oz (79.3 kg)   SpO2 94%   BMI 22.75 kg/m   Wt Readings from Last 5 Encounters:  04/14/18  174 lb 12.8 oz (79.3 kg)  03/02/18 164 lb 6.4 oz (74.6 kg)  02/16/18 174 lb 6.4 oz (79.1 kg)  11/05/17 169 lb 9.6 oz (76.9 kg)  09/05/17 170 lb 3.2 oz (77.2 kg)     Physical Exam Vitals signs and nursing note reviewed.  Constitutional:      General: He is not in acute distress.    Appearance: He is well-developed.  Cardiovascular:     Rate and Rhythm: Normal rate and regular rhythm.  Pulmonary:     Effort: Pulmonary effort is normal. No respiratory distress.     Breath sounds: Wheezing present. No rhonchi.  Skin:    General: Skin is warm and dry.  Neurological:     Mental Status: He is alert and oriented to person, place, and time.       Assessment & Plan:   COPD, frequent exacerbations (HCCrescoDiscussion/clarification of how and when to take inhalers. Will treat for exacerbation today.  Patient Instructions  Will order doxycycline Will order prednisone taper Tessalon pearls ordered  duoneb given in office today depomedrol given in office today Continue current medications Take trelegy daily Albuterol as needed Follow up with Dr. Chase Caller as scheduled or sooner if needed       Fenton Foy, NP 04/16/2018

## 2018-04-14 NOTE — Telephone Encounter (Signed)
Called and spoke with patients wife, patient is confused on how to take medications, he is also having chest heaviness. Patient has been scheduled to see TN today at 12:00. Nothing further needed.

## 2018-04-14 NOTE — Patient Instructions (Signed)
Will order doxycycline Will order prednisone taper Tessalon pearls ordered duoneb given in office today depomedrol given in office today Continue current medications Take trelegy daily Albuterol as needed Follow up with Dr. Chase Caller as scheduled or sooner if needed

## 2018-04-16 ENCOUNTER — Encounter: Payer: Self-pay | Admitting: Nurse Practitioner

## 2018-04-16 NOTE — Assessment & Plan Note (Addendum)
Discussion/clarification of how and when to take inhalers. Will treat for exacerbation today.  Patient Instructions  Will order doxycycline Will order prednisone taper Tessalon pearls ordered duoneb given in office today depomedrol given in office today Continue current medications Take trelegy daily Albuterol as needed Follow up with Dr. Chase Caller as scheduled or sooner if needed

## 2018-05-05 ENCOUNTER — Ambulatory Visit (INDEPENDENT_AMBULATORY_CARE_PROVIDER_SITE_OTHER): Payer: Medicare Other | Admitting: Internal Medicine

## 2018-05-05 ENCOUNTER — Encounter: Payer: Self-pay | Admitting: Internal Medicine

## 2018-05-05 VITALS — BP 142/80 | HR 75 | Ht 73.5 in | Wt 168.0 lb

## 2018-05-05 DIAGNOSIS — R0602 Shortness of breath: Secondary | ICD-10-CM

## 2018-05-05 DIAGNOSIS — Z87891 Personal history of nicotine dependence: Secondary | ICD-10-CM | POA: Diagnosis not present

## 2018-05-05 DIAGNOSIS — J449 Chronic obstructive pulmonary disease, unspecified: Secondary | ICD-10-CM | POA: Diagnosis not present

## 2018-05-05 DIAGNOSIS — R32 Unspecified urinary incontinence: Secondary | ICD-10-CM | POA: Diagnosis not present

## 2018-05-05 DIAGNOSIS — J441 Chronic obstructive pulmonary disease with (acute) exacerbation: Secondary | ICD-10-CM

## 2018-05-05 NOTE — Addendum Note (Signed)
Addended by: Lorretta Harp on: 05/05/2018 12:08 PM   Modules accepted: Orders

## 2018-05-05 NOTE — Patient Instructions (Addendum)
  Urinary incontinence, unspecified type  - refere [facility] - Dr Tresa Moore  COPD, frequent exacerbations ([place]) Stage 2 moderate COPD by GOLD classification [facility]) Former smoker  - stable disease today - get CT chest withoiut contrast - given recent frequent exacerbations  - - do next few weeks - continue trelegy scheduled + albuterol as needed  - if exacerbation return frequently in future consider daliresp  Followup  - 4 months or sooner if needed

## 2018-05-05 NOTE — Progress Notes (Signed)
Brief patient profile:  70 yobm quit smoking 1989 with no problems with variable sob x 2004 and maintained on inhalers referred 05/03/2013 by Dr Titus Mould for evaluation of outpt management for copd after hospitalization and found to have GOLD III COPD 01/2014  with tendency to panic when sob.  Admit date: 04/24/2013  Discharge date: 04/27/2013  Discharge Diagnoses:  Active Problems:  COPD exacerbation  Detailed Hospital Course:  73 yo male with abrupt onset of sob (1/10) that progressively worsened until EMS called. Patient intubated in ED due to severe respiratory distress. In route, patient had increasing tachypnea, decline SpO2, and obtundation. Patient with GCS <8 at arrival, evidence of emesis, intubated for severe respiratory distress. Family reported no evidence of fevers, chills, malaise, worsening coughing, and sob. Initial episode passed, however sister received a call later with him reporting a return of symptoms, complaints of inability of breathing and EMS alerted. Distant history of tobacco abuse. Has never been hospitalized for COPD before this episode. He has had no recent hospitalizations, and has never been previously intubated.  He was admitted to the ICU on 1/11 and was treated with the typical modalities for acute respiratory failure (for presumed AECODP) which included mechanical ventilation with sedation, inhaled bronchodilators, antibiotics, and systemic steroid therapy. He improved later that day and was able to be extubated and sedation was stopped. His care since that point has primarily focused on the de-escalation of the therapies mentioned above. On 1/13 he has returned to a near-baseline state of health and has been deemed a candidate for discharge.  Discharge Plan by diagnoses  Acute exacerbation of COPD  Acute hypercarbic respiratory failure (resolved)  plan  -Follow up with Dr. Melvyn Novas on 05/03/2013 at 2:30PM  -Change steroids to PO Prednisone, taper over 7-10d    -Resume home dose BDs    History of Present Illness  05/03/2013 1st [facility] office visit/ Wert cc more sob since Oct 2014 on symbicort 160 2 bid and spiriva daily  But still  daily use of proventil and no need for neb then needed neb x sev months prior to his admit above but back to baseline since d/c on pred still tapering off but using saba qid, not prn "out of habit" >>return for PFT   01/03/2014 Follow up  [name] .  Complains of increased episodes of increased DOE, wheezing, prod cough with white/clear mucus worse for last week. Family member says he has been progressively get worse over last year. Was admitted 04/2013 w/ COPD flare /VDRF . [name] from last office visit however did not follow up .   Followed at [facility] , says he was told he has Moderate COPD 6 yrs ago. On Symbicort and Spiriva.  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  Says he gets winded easily esp on incline.  Says worse for last week with increased cough and wheezing.  No hemotpysis, wt loss, calf pain, n/v/d, chest pain or fever.  Last abx ~2 months ago.  rec Doxycycline 145m Twice daily  For 7 days  Prednisone taper over next week Mucinex DM Twice daily  As needed  Cough /congestion  Continue on Symbicort and Spiriva .    01/24/2014 f/u ov/Wert re:  GOLD III Chief Complaint  Patient presents with  . Follow-up    PFT done today. Pt states that  SOB and cough have improved some, but not back to normal baseline. Cough is prod with minimal clear sputum.   can do HT but no mall due to sob Prednisone really helped a lot but did not maintain improvement once finished >>pred taper add ppi bid and diet     12/12/2014 acute  ov/Wert re: ? aecopd /new gi/gu complaints ? From spiriva?   Chief Complaint  Patient presents with  . Acute Visit    Pt c/o increased SOB and cough x 4 days. Cough is  not very prod- clear sputum. He also c/o increased gas and bloating.   downhill since prev ov, changing his own maint rx (dc'd pepcid) >  severe cough at hs onset 12/09/14  On spiriva lots of gi/gu issues  Started pred one day prior to OV  As per calendar action plan  Has only used saba so far in hfa form, has neb but doesn't feel he needs it yet.  >changed spiriva to [place]     04/04/2015  f/u ov/Wert re: copd  Chief Complaint  Patient presents with  . Follow-up    pt following for COPD: pt states hes been its been on and off. pt states 3 weekws ago he was diagnosed with bronchitits but he feels much better. pt c/o a dry cough thats been bothering him mostly in the night time. pt states at times he has to take the CPAP off because he has these couhing fits. no c/o SOB , wheezing, or chest tightness.   has med calendar but not following the action plans at the bottom for cough  > For cough/ congestion > mucinex dm and flutter valve       07/20/2015  f/u ov/Wert re: aecopd/ using med calendar though not flutter or approp dose of mucinex dm/ confused with when to use pred  Chief Complaint  Patient presents with  . Follow-up    pt c/o nonprod cough, chest congestion, sob X4-5 days.  S/s worse qhs.    last prednisone finished on 07/16/15 and getting worse again in terms of breathing and dry coughing rec In the event that you need your nebulizer more than rarely for your breathing: prednisone 20 mg daily until better  Then 10 mg daily x 5 days and stop For cough > mucinex dm 1200 mg every 12 hours and use the flutter valve Work on inhaler technique    08/29/2015  f/u ov/Wert re:  GOLD III  symbicort 160 2bid/ tudorza  Chief Complaint  Patient presents with  . Follow-up    Cough has not improved since the last visit. He uses ventolin at least once per day and neb with albuterol 2 x daily on average.   Still using oil based vit D, following med calendar better / rarely taking prednisone but  helps when does  Lots of hoarseness and dry cough  Day >   >stop [place] and begin Spiriva     11/01/2015 NP  Acute OV  Presents for an acute office visit Patient complains of 2 weeks of increased cough, shortness of breath and wheezing. Feels that the high temperatures and humidity or making his breathing worse. Wears out with minimum activity   Denies any chest congestion/tightness, sinus pressure/drainage, fever, nausea or vomiting.  Has any fever or discolored mucus. Remains on Symbicort and Spiriva. Did start on prednisone over the last 1-2 days. Still has persistent cough and wheezing. Cough is keeping him up at night.. Patient and wife  have multiple questions about his severity of his underlying COPD. He went over purse lip breathing. He would like to try pulmonary rehabilitation again. rec Zpack to have on hold if symtpoms worsen with discolored mucus.  Prednisone taper over next week.  Hydromet 1 tsp every 6hr, As needed  Cough , may make sleepy.  Refer to pulmonary rehab    12/25/2015  f/u ov/Wert re: GOLD III copd/ symb/spiriva - using med calendar well  Chief Complaint  Patient presents with  . Follow-up    Doing well and denies any co's today.   doe [facility] = can't walk a nl pace on a flat grade s sob but does fine slow and flat eg  WM shopping ok p using [facility] parking - rare saba need -has not needed pred recently main complaint is abd distention/ constipation  rec Try spiriva just one puff each am to see if bloating/constipation improve  and if so leave the dose just at one pff - if not ok try off completely after a few weeks of one pff but if off it makes no difference at all then just restart it at 2 each am as per med calendar  See calendar for specific medication instructions    01/17/2016 acute extended ov/Wert re: GOLD III/ aecopd  Chief Complaint  Patient presents with  . Acute Visit    Pt c/o cough x 5 days- prod with min clear sputum.  He had nosebleed last  night.    severe cough onset 10/1 > min white mucus, worse at hs  Confused with names of meds and correlating them with med calendar though did bring it with him  Did not previously disclose use of tessalon but says now he was using it prn and worked fine until it ran out  Note note now on tenoretic, also not on med calendar  rec For dry cough as per med calendar > tessalon pearls 200 mg every 4-6 hours as needed and use the flutter valve For congested cough > mucinex dm up to 1200  Every 12 hours and use the flutter valve also  Increase the spiriva back to 2 puffs each am  See calendar for specific medication instructions   01/25/2016 acute extended ov/Wert re: copd gold III/ chronic dry cough  Chief Complaint  Patient presents with  . Acute Visit    Pt. c/o coughing on going  for couple of months, frequent coughing spills, some clear mucus, some sob, some wheezing, some chest pain after having coughing spells   improving ex tol at rehab s 02 Cough is worse around 3 am nightly x months but no excess mucus/ just dry hack ? Really using pepcid at hs as per med calendar  Has pred to take for flare of sob but not cough so has not tried it yet  >>pred taper , changed atenolol to bisoprolol    02/12/2016 Acute OV : COPD  He says he is feeling some better but has not gotten over his cough yet.  He started on tessalon last ov which is helping some.  Seen 2 weeks ago , given prednisone taper and change from atenolol to bisoprolol.  Chest xray last ov with copd changes , no acute process.  He denies chest pain , orthopnea, edema or fever.  Cough is mainly dry , sometimes worse at night .  Him and his wife are worried why the cough does not go away .     OV 03/22/2016  Chief Complaint  Patient presents with  . Follow-up    Changing from MW to MR. Pt here after PFT. Pt c/o dry cough and SOB at baseline. Pt denies CP/tightness and f/c/s.     73 year old male presents with his wife.  They've been married for 2 years. In 2015 he had significant life-threatening COPD exacerbation. Since then been on triple inhaler therapy. Currently stable. He is doing pulmonary rehabilitation maintenance phase of the second time. Overall he tells me that his major complains of moderate degree of cough and shortness of breath and wheezing. The cough is essentially dry. At pulmonary rehabilitation he does not desaturate. He has been doing this for one month. He'll finish pulmonary rehabilitation and another one month. Wife's main concern is that he's having frequent COPD exacerbations. According to her at least 5 times he's had COPD exacerbation requiring prednisone and antibiotic of both in the year 2017. Most recently was 2 weeks ago. Emotions trigger it. The house does not have any pets or bad carpet or mold or mildew or cockroach.  He underwent Pulmi function testing today and compared to 2015 he is actually improved to go stage II COPD. Previously was Gold stage III COPD. He had high resolution CT chest that shows coronary artery calcification for which I do not see any evidence of cardiac workup. He has emphysema but no evidence of interstitial lung disease or cancer.  Currently he does well. It is noted that he is on testosterone shots  OV 05/17/2016  Chief Complaint  Patient presents with  . Follow-up    2 mo f/u. Breathing has been the same last visit. Denies any SOB    Follow-up Gold stage II COPD. He presents with his wife. In the past used to be Gold stage III COPD but when we repeated pulmonary function testing is probably function test that actually improved to Gold stage II in December 2017 [see below]. His main issues that he has severe chronic cough. They're also worried about recurrent COPD exacerbations. In reviewing the chart and in talking to him it appears that the cough is severe and rated as a score of 4/5 .and is present all the time associated with thick white mucus. He also  has some dyspnea. His cough is so significant that he has made some phone calls about it and he gets treated for COPD exacerbation with prednisone and antibiotic which actually helped his cough. Therefore he believes that he has recurrent COPD exacerbations. But on taking the history closely this cough is stable and is still at a level IV/V. He is frustrated by it. He denies it is clearing of the throat although in the office he is cleared his throat. His wife is wondering about other etiologies be on COPD this contributing to this cough. There is associated dyspnea that is significant. Immediate appears that these are all unchanged compared to his last visit in December 2017. But they're definitely bothering him. He did tell me that the Brio helped him. Of note he does have some postnasal drainage and review of the chart suggests that he's never had sinus CT  Of note at this time he is not interested in either roflumilast or partipation in aecopd prevention research trial     IMPRESSION: 1. No evidence of interstitial lung disease. 2. Moderate centrilobular emphysema and mild diffuse bronchial wall thickening, consistent with the provided history of COPD. 3. Small parenchymal bands in the posterior basilar bilateral lower lobes, most consistent with mild  postinfectious/postinflammatory scarring. 4. Aortic atherosclerosis. Ectatic ascending thoracic aorta, maximum diameter 4.1 cm. Recommend annual imaging followup by CTA or MRA. This recommendation follows 2010 ACCF/AHA/AATS/ACR/ASA/SCA/SCAI/SIR/STS/SVM Guidelines for the Diagnosis and Management of Patients with Thoracic Aortic Disease. Circulation. 2010; 121: Y185-U314. 5. Three-vessel coronary atherosclerosis.   Electronically Signed   By: Ilona Sorrel M.D.   On: 02/20/2016 13:59    Results for SHAKUR, LEMBO (MRN [id number]) as of 03/22/2016 11:11  Ref. Range 01/24/2014 10:51 03/22/2016 09:59  FVC-Post Latest Units: L 2.75 3.53    FVC-%Pred-Post Latest Units: % 56 84  FVC-%Change-Post Latest Units: % 3 5  FEV1-Post Latest Units: L 1.26 1.79  FEV1-%Pred-Post Latest Units: % 34 56  Results for LADARRIAN, ASENCIO (MRN [id number]) as of 03/22/2016 11:11  Ref. Range 01/24/2014 10:51  DLCO unc Latest Units: ml/min/mmHg 17.16  DLCO unc % pred Latest Units: % 48   OV 06/20/2016  Chief Complaint  Patient presents with  . Follow-up    Pt states he is doing well; has cough with yellow mucus. Pt had CT maxillofacial done 05/23/16 and would like results.       Follow-up moderate COPD. He has history of recurrent exacerbations. Last visit limited CT sinus and the report suggested possible chronic sinusitis. I referred him to ENT. He  he saw Dr. Constance Holster in ENT and was reassured. They were even told according to the history that CT sinus was normal. They're asking for CT sinus report. Overall he is doing well. He is not using 3% saline nebulizer as advised him. He is using his Spiriva and Brio. There are no new issues. COPD cat score is 23 and is similar to baseline   OV 01/13/2017  Chief Complaint  Patient presents with  . Follow-up    Pt states that he has been doing good. Pt states that he has realized he has been coughing more than usual. Occ SOB. Denies any CP.    Follow-up moderate COPD. He has a history of recurrent exacerbations. Currently on Spiriva and Brio and Singulair daily. This is a routine follow-up. He tells me that he had another exacerbation and finished a 10 day prednisone 10 days ago. He is close to baseline. He still has some coughing. He is wondering about daily prednisone after talking to a church member. He is willing to have a flu shot today. Otherwise no new complaints other than the fact that if any recurrent exacerbations over which is frustrated.  01/21/2017 Acute OV : Cough  Complains of 1 week of increased cough , mainly dry in nature. No wheezing. Was seen 1 week ago , changed to [place] . Feels this is  helping and making breathing better. Does not feel  Sick. No fever, discolored mucus, chest pain , orthopnea, edema. Does have some post nasal drainage , ticking in throat. Uses mint cough drops to help . Has some nasal stuffiness. Using Mucinex DM As needed  Cough . Cough is aggravating , interrupts is social activities. No GERD sx .    OV 04/25/2017  Chief Complaint  Patient presents with  . Follow-up    Pt had recent hospitalization at [facility] in [place], and was there 1 night before they left. States that he is doing good now and has no complaints of cough, SOB, orCP.   73 year old with Gold stage III COPD that changed to Gold stage II COPD after inhaler treatment.  He is now on trilogy inhaler.  Overall COPD stable.  In fact after  starting trilogy inhaler his COPD CAT score is improved to 17.  He is here with his daughter.  The only issue is that on New Year's Eve that he had a vasovagal syncope episode and was taken to [facility] ER.  That his heart rate was 51 which is his baseline but they try to admit him and he walked out AMA.  It appears that he might have had some postural hypotension related to Viagra intake.  He has appointment with Dr. Woody Seller cardiologist pending.   09/05/2017 Acute OV : COPD  Patient presents for an acute office visit.  Patient complains over the last week that he has had increased cough congestion thick mucus wheezing and increased shortness of breath. He remains on Trelegy daily.  Denies any fever, chest pain, orthopnea, edema Has been using Mucinex without much relief.  Cough is aggravating and keeping him awake at night.  Denies any reflux symptoms. Appetite is  good with no nausea vomiting or diarrhea   OV 11/05/2017  Chief Complaint  Patient presents with  . Follow-up    Pt states he has been doing well since last visit and denies any complaints.   Follow-up Gold stage II COPD.  Presents for routine follow-up. Last seen by myself in January 2019 and  then in May 2019*nurse practitioner for COPD exacerbation. At this point in time he continues on TRELEGY ellipta inhaler and he is feeling great. COPD cat score is documented below with symptoms severe deep. It is at its lowest at 14. He is being around $55 for this. He has [facility] benefit and they want to give him anoro + arnuity for free but he feels that he is better off with trelegy and does not want to change. Review of the chart shows he is 30 pack smoking history quit 30 years ago most recent CT chest was in November 2017 without any nodules.   TEST   PFTs 01/24/2014  FEV1  1.30 (35%) ratio 49 with severe air trapping and no better p saba and dlco 48% -- referred to rehab 01/24/14  -Med calendar 02/21/2014 > did not recognize it when reprinted 03/29/14 > reviewed , 08/03/2014 , 04/28/2015   CT chest 02/2016 no ILD , emphysema changes   OV 05/05/2018  Subjective:  Patient ID: Jeremiah [name], male , DOB: 1945/11/06 , age 51 y.o. , MRN: [id number] , ADDRESS: [place] [id number]   05/05/2018 -   Chief Complaint  Patient presents with  . Follow-up    Pt states he has been doing well and states his breathing is currently good. Pt states he has been having some problems with urinating a lot.     HPI Barett Whidbee 73 y.o. -returns for follow-up of his COPD.  I personally saw him in July 2019.  After that he has had 3-4 visits with nurse practitioner for exacerbations.  Last CT chest 2017.  Now doing well on Trelegy.  COPD CAT score is documented below.  Compared to the past additionally worsening symptoms but he is not feeling it.  His?  Daughter feels that main reason for frequent exacerbation is because of relocation from [place] to [place] for social reasons.  He is asking if he can start yoga . his main issue is that for the last several months he has had random urinary incontinence.  He has not seen a urologist.  He is wondering if it is due to Trelegy but I told him  Trelegy has an  anticholinergic that usually causes urinary retention.  He believes he is had a PSA checked.  But is not sure about the results.  His smoking continues to be in remission.     CAT COPD Symptom & Quality of Life Score (GSK trademark) 0 is no burden. 5 is highest burden 03/22/2016  06/20/2016  04/25/2017  11/05/2017 trelegy 05/05/2018   Never Cough -> Cough all the time _0 No phlegm in chest -> Chest is full of phlegm _1 No chest tightness -> Chest feels very tight 1 0 0 2 4  No dyspnea for 1 flight stairs/hill -> Very dyspneic for 1 flight of stairs _2 No limitations for ADL at home -> Very limited with ADL at home _3 0 5  Confident leaving home -> Not at all confident leaving home 0 5 2 0 4  Sleep soundly -> Do not sleep soundly because of lung condition 0 _4 Lots of Energy -> No energy at all _5 TOTAL Score (max 40)  _6 32     ROS - per HPI     has a past medical history of COPD (chronic obstructive pulmonary disease) ([place]), Depression, Hypertension, PTSD (post-traumatic stress disorder), and Sleep apnea.   reports that he quit smoking about 31 years ago. His smoking use included cigarettes. He has a 30.00 pack-year smoking history. He has never used smokeless tobacco.  Past Surgical History:  Procedure Laterality Date  . HAMMER TOE SURGERY  April 2017    No Known Allergies  Immunization History  Administered Date(s) Administered  . Influenza Split 01/13/2013  . Influenza, High Dose Seasonal PF 12/28/2014, 01/15/2016, 01/13/2017, 01/16/2018  . Influenza,inj,Quad PF,6+ Mos 01/24/2014  . Pneumococcal Conjugate-13 12/08/2017  . Pneumococcal-Unspecified 04/15/2010  . Tdap 12/28/2014    Family History  Problem Relation Age of Onset  . Asthma Maternal Grandmother   . Heart disease Father   . Dementia Mother      Current Outpatient Medications:  .  albuterol (PROVENTIL HFA;VENTOLIN HFA) 108 (90 Base)  MCG/ACT inhaler, Inhale 2 puffs into the lungs every 4 (four) hours as needed for wheezing or shortness of breath (((PLAN B)))., Disp: 1 Inhaler, Rfl: 5 .  ALPRAZolam (XANAX) 0.5 MG tablet, TAKE 1 TABLET BY MOUTH TWICE A DAY AS NEEDED FOR ANXIETY, Disp: 60 tablet, Rfl: 1 .  AMLODIPINE BESYLATE PO, Take 2.5 mg by mouth daily., Disp: , Rfl:  .  aspirin EC 81 MG tablet, Take 81 mg by mouth daily., Disp: , Rfl:  .  B-D 3CC LUER-LOK SYR 21GX1-1/2 21G X 1-1/2" 3 ML MISC, USE NEEDLE/SYRINGE TO INJECT TESTOSTERONE INTO SKIN ONCE EVERY 2 WEEKS., Disp: , Rfl: 0 .  benzonatate (TESSALON) 100 MG capsule, Take 1 capsule (100 mg total) by mouth 3 (three) times daily., Disp: 30 capsule, Rfl: 1 .  dextromethorphan-guaiFENesin (MUCINEX DM) 30-600 MG per 12 hr tablet, Take 67m every 4 hours as needed with flutter valve for cough and congestion, Disp: , Rfl:  .  erythromycin ophthalmic ointment, Place into both eyes nightly., Disp: , Rfl:  .  esomeprazole (NEXIUM) 40 MG capsule, Take 40 mg by mouth daily before breakfast. , Disp: , Rfl: 2 .  famotidine (PEPCID) 20 MG tablet, Take 20 mg by mouth at bedtime., Disp: , Rfl:  .  fluticasone (FLONASE) 50 MCG/ACT nasal spray, Place  2 sprays into both nostrils 2 (two) times daily., Disp: , Rfl:  .  Fluticasone-Umeclidin-Vilant (TRELEGY ELLIPTA) 100-62.5-25 MCG/INH AEPB, Inhale 1 puff into the lungs daily., Disp: 28 each, Rfl: 0 .  magnesium oxide (MAG-OX) 400 MG tablet, Take 400 mg by mouth 2 (two) times daily. For leg cramps, Disp: , Rfl:  .  Multiple Vitamins-Minerals (CENTRUM SILVER 50+MEN) TABS, Take 1 tablet by mouth daily., Disp: , Rfl:  .  pravastatin (PRAVACHOL) 10 MG tablet, Take 10 mg by mouth daily., Disp: , Rfl:  .  Respiratory Therapy Supplies (FLUTTER) DEVI, Use as directed, Disp: 1 each, Rfl: 0 .  Respiratory Therapy Supplies (NEBULIZER COMPRESSOR) KIT, Use as directed DX: J44.9, Disp: 1 each, Rfl: 0 .  traZODone (DESYREL) 50 MG tablet, Take 1 tablet by mouth  at bedtime as needed., Disp: , Rfl:       Objective:   Vitals:   05/05/18 1115  BP: (!) 142/80  Pulse: 75  SpO2: 95%  Weight: 168 lb (76.2 kg)  Height: 6' 1.5" (1.867 m)    Estimated body mass index is 21.86 kg/m as calculated from the following:   Height as of this encounter: 6' 1.5" (1.867 m).   Weight as of this encounter: 168 lb (76.2 kg).  _0 @  [employer]   05/05/18 1115  Weight: 168 lb (76.2 kg)     Physical Exam  General Appearance:    Alert, cooperative, no distress, appears stated age - yes , Deconditioned looking - no , OBESE  - no, Sitting on Wheelchair -  no  Head:    Normocephalic, without obvious abnormality, atraumatic  Eyes:    PERRL, conjunctiva/corneas clear,  Ears:    Normal TM's and external ear canals, both ears  Nose:   Nares normal, septum midline, mucosa normal, no drainage    or sinus tenderness. OXYGEN ON  - no . Patient is @ [facility]   Throat:   Lips, mucosa, and tongue normal; teeth and gums normal. Cyanosis on lips - no  Neck:   Supple, symmetrical, trachea midline, no adenopathy;    thyroid:  no enlargement/tenderness/nodules; no carotid   bruit or JVD  Back:     Symmetric, no curvature, ROM normal, no CVA tenderness  Lungs:     Distress - no , Wheeze no, Barrell Chest - no, Purse lip breathing - no, Crackles - no   Chest Wall:    No tenderness or deformity.    Heart:    Regular rate and rhythm, S1 and S2 normal, no rub   or gallop, Murmur - no  Breast Exam:    NOT DONE  Abdomen:     Soft, non-tender, bowel sounds active all four quadrants,    no masses, no organomegaly. Visceral obesity - no  Genitalia:   NOT DONE  Rectal:   NOT DONE  Extremities:   Extremities - normal, Has Cane - no, Clubbing - no, Edema - no  Pulses:   2+ and symmetric all extremities  Skin:   Stigmata of Connective Tissue Disease - no  Lymph nodes:   Cervical, supraclavicular, and axillary nodes normal  Psychiatric:  Neurologic:   Pleasant - yes, Anxious  - no, Flat affect - no  CAm-ICU - neg, Alert and Oriented x 3 - yes, Moves all 4s - yes, Speech - normal, Cognition - intact           Assessment:       ICD-10-CM   1. Urinary incontinence,  unspecified type R32   2. COPD, frequent exacerbations ([place]) J44.1   3. Stage 2 moderate COPD by GOLD classification ([place]) J44.9   4. Former smoker Z87.891        Plan:     Patient Instructions   Urinary incontinence, unspecified type  - [facility] Dr Tresa Moore  COPD, frequent exacerbations ([place]) Stage 2 moderate COPD by GOLD classification ([place]) Former smoker  - stable disease today - get CT chest withoiut contrast - given recent frequent exacerbations  - - do next few weeks - continue trelegy scheduled + albuterol as needed  - if exacerbation return frequently in future consider daliresp  Followup  - 4 months or sooner if needed       SIGNATURE    Dr. Brand Males, M.D., F.C.C.P,  Pulmonary and Critical Care Medicine Staff Physician, [place] Director - Interstitial Lung Disease  Program  Pulmonary [place] [facility] [place], [place], [address]  Pager: [phone], If no answer or between  15:00h - 7:00h: call [phone] Telephone: [phone]  12:05 PM 05/05/2018

## 2018-05-11 ENCOUNTER — Telehealth: Payer: Self-pay | Admitting: Internal Medicine

## 2018-05-11 MED ORDER — DOXYCYCLINE HYCLATE 100 MG PO TABS: 100.0000 mg | ORAL_TABLET | Freq: Two times a day (BID) | ORAL | 0 refills | Status: DC

## 2018-05-11 MED ORDER — PREDNISONE 10 MG PO TABS: ORAL_TABLET | ORAL | 0 refills | Status: DC

## 2018-05-11 NOTE — Telephone Encounter (Signed)
? ?

## 2018-05-11 NOTE — Telephone Encounter (Signed)
Jeremiah [name] states patient continues to have SOB and wheezing that is getting worse since last week's office visit.   COPD, frequent exacerbations ([place]) Stage 2 moderate COPD by GOLD classification [facility]) Former smoker  - stable disease today - get CT chest withoiut contrast - given recent frequent exacerbations  -           - do next few weeks - continue trelegy scheduled + albuterol as needed  - if exacerbation return frequently in future consider daliresp  Followup  - 4 months or sooner if needed  Dr. Chase Caller please advise. Thanks   Pharmacy: [facility] [place], [place]

## 2018-05-11 NOTE — Telephone Encounter (Signed)
Spoke with pt and his wife. They are aware of MR's recommendations. Rxs have been sent in. Nothing further was needed.

## 2018-05-14 ENCOUNTER — Ambulatory Visit (INDEPENDENT_AMBULATORY_CARE_PROVIDER_SITE_OTHER)
Admission: RE | Admit: 2018-05-14 | Discharge: 2018-05-14 | Disposition: A | Discharge status: Home / Self Care | Payer: Medicare Other | Source: Ambulatory Visit | Attending: Internal Medicine | Admitting: Internal Medicine

## 2018-05-14 DIAGNOSIS — R0602 Shortness of breath: Secondary | ICD-10-CM

## 2018-05-25 ENCOUNTER — Telehealth: Payer: Self-pay | Admitting: Internal Medicine

## 2018-05-25 DIAGNOSIS — I712 Thoracic aortic aneurysm, without rupture, unspecified: Secondary | ICD-10-CM

## 2018-05-25 NOTE — Telephone Encounter (Signed)
Refer to cardiac surgery - CVTS for thoracic aorta aneurysm

## 2018-05-25 NOTE — Telephone Encounter (Signed)
Notes recorded by Brand Males, MD on 05/22/2018 at 6:20 PM EST CT shows emphysema. No cancer. No fibrosis . No pneumonioa. He does have 4cm aortic aneuryshm for which he needs to follow with cardiology or thoracic surgeon once a year --------------------------------- Spoke with pt's wife, [name]. She is aware of results. Pt is no longer following with Cardiology, she would like for a new referral to be placed to get him set up with a new Cardiologist.  Dr. Chase Caller - please advise. Thanks.

## 2018-05-25 NOTE — Telephone Encounter (Signed)
Called the are aware of referral and order has been placed nothing further is needed.

## 2018-06-28 NOTE — Progress Notes (Deleted)
Subjective:  Primary Physician:  Selena Lesser, MD  Patient ID: Jeremiah [name], male    DOB: Nov 16, 1945, 73 y.o.   MRN: [id number]  No chief complaint on file.   HPI: Jeremiah [name]  is a 73 y.o. male. Patient is feeling well, he did not have any further dizziness, near-syncope or syncope since the last visit. Patient has history of one syncopal episode on 04/13/2017, he was in a church in [place]. Patient was sitting and while eating, he became very dizzy and passed out for a few seconds. Then, he felt weak and dizzy off and on for about 15 minutes. Patient was taken to ER. No definite etiology of syncope was found. He was advised admission but patient refused and came back home. Patient denies feeling any palpitations during the syncopal episode or at any other time. He also denies any chest pain. Subsequently, he was seen by his PCP and beta blocker bisoprolol was discontinued due to bradycardia.  Patient has history of COPD. He has exertional dyspnea on walking for about 1/2 mile. He also has dyspnea at rest off and on associated with wheezing due to COPD, much improved now with Trelegy. No orthopnea or PND. Patient denies any chest pain with exertion.  He had muscle cramps from atorvastatin. It was changed to low-dose pravastatin. Patient is feeling much better now and denies any muscle pain or cramps. No muscle weakness. His CPK was mildly elevated at the dose of pravastatin 10 mg daily, thus it was decreased to 5 mg daily.  No history of swelling on the legs and no claudication.  Patient has h/o hypertension. No history of diabetes. He has h/o borderline high LDL cholesterol. He is a former smoker, quit smoking more than 30 years ago. Patient also has history of PTSD and sleep apnea, has CPAP, but, doesn't use it regularly.  No history of thyroid problems. No history of TIA or CVA.  ***  Past Medical History:  Diagnosis Date  . COPD (chronic obstructive pulmonary  disease) ([place])   . Depression   . Hypertension   . PTSD (post-traumatic stress disorder)   . Sleep apnea     Past Surgical History:  Procedure Laterality Date  . HAMMER TOE SURGERY  April 2017    Social History   Socioeconomic History  . Marital status: Married    Spouse name: Not on file  . Number of children: 2  . Years of education: 27  . Highest education level: Not on file  Occupational History  . Occupation: Retired Solicitor  . Financial resource strain: Not on file  . Food insecurity:    Worry: Not on file    Inability: Not on file  . Transportation needs:    Medical: Not on file    Non-medical: Not on file  Tobacco Use  . Smoking status: Former Smoker    Packs/day: 1.00    Years: 30.00    Pack years: 30.00    Types: Cigarettes    Last attempt to quit: 04/16/1987    Years since quitting: 31.2  . Smokeless tobacco: Never Used  Substance and Sexual Activity  . Alcohol use: Yes    Alcohol/week: 2.0 standard drinks    Types: 2 Glasses of wine per week  . Drug use: No  . Sexual activity: Not on file  Lifestyle  . Physical activity:    Days per week: Not on file    Minutes per session: Not  on file  . Stress: Not on file  Relationships  . Social connections:    Talks on phone: Not on file    Gets together: Not on file    Attends religious service: Not on file    Active member of club or organization: Not on file    Attends meetings of clubs or organizations: Not on file    Relationship status: Not on file  . Intimate partner violence:    Fear of current or ex partner: Not on file    Emotionally abused: Not on file    Physically abused: Not on file    Forced sexual activity: Not on file  Other Topics Concern  . Not on file  Social History Narrative   Fun: Live   Denies religious beliefs effecting health care.   Feels safe at home and denies abuse.     Current Outpatient Medications on File Prior to Visit  Medication Sig Dispense  Refill  . albuterol (PROVENTIL HFA;VENTOLIN HFA) 108 (90 Base) MCG/ACT inhaler Inhale 2 puffs into the lungs every 4 (four) hours as needed for wheezing or shortness of breath (((PLAN B))). 1 Inhaler 5  . ALPRAZolam (XANAX) 0.5 MG tablet TAKE 1 TABLET BY MOUTH TWICE A DAY AS NEEDED FOR ANXIETY 60 tablet 1  . AMLODIPINE BESYLATE PO Take 2.5 mg by mouth daily.    [name] Kitchen aspirin EC 81 MG tablet Take 81 mg by mouth daily.    . B-D 3CC LUER-LOK SYR 21GX1-1/2 21G X 1-1/2" 3 ML MISC USE NEEDLE/SYRINGE TO INJECT TESTOSTERONE INTO SKIN ONCE EVERY 2 WEEKS.  0  . benzonatate (TESSALON) 100 MG capsule Take 1 capsule (100 mg total) by mouth 3 (three) times daily. 30 capsule 1  . dextromethorphan-guaiFENesin (MUCINEX DM) 30-600 MG per 12 hr tablet Take 76m every 4 hours as needed with flutter valve for cough and congestion    . doxycycline (VIBRA-TABS) 100 MG tablet Take 1 tablet (100 mg total) by mouth 2 (two) times daily. 10 tablet 0  . erythromycin ophthalmic ointment Place into both eyes nightly.    . esomeprazole (NEXIUM) 40 MG capsule Take 40 mg by mouth daily before breakfast.   2  . famotidine (PEPCID) 20 MG tablet Take 20 mg by mouth at bedtime.    . fluticasone (FLONASE) 50 MCG/ACT nasal spray Place 2 sprays into both nostrils 2 (two) times daily.    . Fluticasone-Umeclidin-Vilant (TRELEGY ELLIPTA) 100-62.5-25 MCG/INH AEPB Inhale 1 puff into the lungs daily. 28 each 0  . magnesium oxide (MAG-OX) 400 MG tablet Take 400 mg by mouth 2 (two) times daily. For leg cramps    . Multiple Vitamins-Minerals (CENTRUM SILVER 50+MEN) TABS Take 1 tablet by mouth daily.    . pravastatin (PRAVACHOL) 10 MG tablet Take 10 mg by mouth daily.    . predniSONE (DELTASONE) 10 MG tablet 40 mg x1 day, then 30 mg x1 day, then 20 mg x1 day, then 10 mg x1 day, and then 5 mg x1 day and stop 11 tablet 0  . Respiratory Therapy Supplies (FLUTTER) DEVI Use as directed 1 each 0  . Respiratory Therapy Supplies (NEBULIZER COMPRESSOR) KIT Use  as directed DX: J44.9 1 each 0  . traZODone (DESYREL) 50 MG tablet Take 1 tablet by mouth at bedtime as needed.     No current facility-administered medications on file prior to visit.     ***Review of Systems  Constitutional: Negative.  Negative for fever.  HENT: Negative for nosebleeds.  Eyes: Negative for blurred vision.  Respiratory: Positive for shortness of breath. Negative for cough.   Gastrointestinal: Negative for abdominal pain, nausea and vomiting.  Genitourinary: Negative for dysuria.  Musculoskeletal: Negative for myalgias.  Skin: Negative for itching and rash.  Neurological: Negative for dizziness and loss of consciousness.  Endo/Heme/Allergies: Does not bruise/bleed easily.  Psychiatric/Behavioral: The patient is not nervous/anxious.        Objective:  There were no vitals taken for this visit. There is no height or weight on file to calculate BMI.  ***Physical Exam  Constitutional: He is oriented to person, place, and time. He appears well-developed and well-nourished. No distress.  HENT:  Head: Normocephalic and atraumatic.  Eyes: Pupils are equal, round, and reactive to light. Conjunctivae are normal.  Neck: No JVD present. No thyromegaly present.  Cardiovascular: Normal rate, regular rhythm and intact distal pulses.  Murmur (Gr. 1/6 ESM in aortic and LPSA) heard. Pulmonary/Chest: Effort normal and breath sounds normal. He has no wheezes. He has no rales.  Abdominal: Soft. Bowel sounds are normal. There is no abdominal tenderness.  Musculoskeletal:        General: No edema.  Lymphadenopathy:    He has no cervical adenopathy.  Neurological: He is alert and oriented to person, place, and time. No cranial nerve deficit.  Skin: Skin is warm and dry.  Psychiatric: He has a normal mood and affect.  Nursing note and vitals reviewed.   CARDIAC STUDIES:  Cardiac event monitor-05/06/17-06/04/17- sinus rhythm, average heart rate-66/m. Range 47-113/m. No arrhythmias  were seen.  Lexiscan myoview stress test 04/29/2016: 1. The resting electrocardiogram demonstrated normal sinus rhythm, normal resting conduction, no resting arrhythmias and normal rest repolarization. Stress EKG is non-diagnostic for ischemia as it a pharmacologic stress using Lexiscan.Stress symptoms included dyspnea. 2. The perfusion images reveal soft tissue attenuation artifact in the anterior wall. The LV is mildly dilated in both rest ad stress images at LV end diastolic volume of 854OE. Dynamic gated images reveal normal wall motion and endocardial thickening with calculated LVEF of 55%. This is a low risk study.  Echocardiogram- 05/14/2017: Left ventricle cavity is normal in size. Moderate concentric hypertrophy of the left ventricle. Normal global wall motion. Doppler evidence of grade I (impaired) diastolic dysfunction, normal LAP. Calculated EF 67%. Left atrial cavity is mildly dilated. Mild to moderate aortic regurgitation. Mild (Grade I) mitral regurgitation. Inadequate tricuspid regurgitation jet to estimate pulmonary artery pressure. Normal right atrial pressure.  *** Assessment & Recommendations:   There are no diagnoses linked to this encounter.  Recommendation: *** Syncope in the past was probably due to severe bradycardia from beta blocker therapy. Patient has remained asymptomatic since the beta blocker was stopped. He has mild to moderate aortic regurgitation on the echo, valvular heart disease is stable.  Patient has mildly elevated blood pressure. He said that he had his blood pressure mildly elevated at PCPs office also. I recommended amlodipine 2.5 mg daily. Patient was advised to last time to have the CPK but he did not get it done. I'll scheduled him now for CMP, lipid panel and CPK.  Patient was advised to use CPAP regularly. Bad health effects of sleep apnea including hypertension, cardiac problems like MI, A. fib or other arrhythmias, increased risk of stroke  etc. were explained to him.  Primary prevention was again discussed. Patient was advised to follow low-salt, low-cholesterol diet. He was encouraged to walk regularly as tolerated.  I will see him in follow-up after 6 months.  Call us earlier if there is recurrence of syncope or any other cardiac problems.  Despina Hick, MD, [facility] 06/28/2018, 4:02 PM [facility]. [place] Pager: [phone] Office: [phone] If no answer Cell [phone]

## 2018-06-29 ENCOUNTER — Ambulatory Visit: Payer: Self-pay | Admitting: Cardiology

## 2018-06-29 DIAGNOSIS — Z5329 Procedure and treatment not carried out because of patient's decision for other reasons: Secondary | ICD-10-CM

## 2018-06-30 ENCOUNTER — Encounter: Payer: Medicare Other | Admitting: Thoracic Surgery (Cardiothoracic Vascular Surgery)

## 2018-07-08 ENCOUNTER — Telehealth: Payer: Self-pay | Admitting: Internal Medicine

## 2018-07-08 NOTE — Telephone Encounter (Signed)
Went over COVID sx: pt was a little concerned. No fever,sob,or cough no more that usual. Pt felt better. Also had a concern that he would run out of refills. Made patient aware that when he gets to last refill his pharmacy can request refill upon his request. Nothing further needed.

## 2018-07-11 ENCOUNTER — Other Ambulatory Visit: Payer: Self-pay | Admitting: Adult Health

## 2018-07-30 ENCOUNTER — Telehealth: Payer: Self-pay | Admitting: Internal Medicine

## 2018-07-30 MED ORDER — NYSTATIN 100000 UNIT/ML MT SUSP: 5.0000 mL | Freq: Four times a day (QID) | OROMUCOSAL | 0 refills | Status: DC

## 2018-07-30 NOTE — Telephone Encounter (Signed)
Primary Pulmonologist: MR Last office visit and with whom: 05/05/2018 with MR What do we see them for (pulmonary problems): COPD Last OV assessment/plan: Instructions    Urinary incontinence, unspecified type  - [facility] Tresa [facility]  COPD, frequent exacerbations ([place]) Stage 2 moderate COPD by GOLD classification ([place]) Former smoker  - stable disease today - get CT chest withoiut contrast - given recent frequent exacerbations  -           - do next few weeks - continue trelegy scheduled + albuterol as needed  - if exacerbation return frequently in future consider daliresp  Followup  - 4 months or sooner if needed     Was appointment offered to patient (explain)?  Pt wants meds to be called in   Reason for call: called and spoke with pt. Pt stated he has sores in his mouth and believes it could be thrush from inhalers. Pt is requesting a Rx to be called in to help with this. Sending to [facility] since she as one of the last APPs pt saw prior to MR. Tonya, please advise on this for pt. Thanks!

## 2018-07-30 NOTE — Telephone Encounter (Signed)
Called and spoke with pt letting him know that the Rx had been called in to his pharmacy. Pt expressed understanding.  Nothing further needed.

## 2018-07-30 NOTE — Telephone Encounter (Signed)
I sent in nystatin mouth wash. Thanks.

## 2018-08-13 ENCOUNTER — Telehealth: Payer: Self-pay | Admitting: Internal Medicine

## 2018-08-13 MED ORDER — BENZONATATE 100 MG PO CAPS: 100.0000 mg | ORAL_CAPSULE | Freq: Three times a day (TID) | ORAL | 5 refills | Status: DC

## 2018-08-13 NOTE — Telephone Encounter (Signed)
Attempted to call pt but unable to reach. Left message for pt to return call. 

## 2018-08-13 NOTE — Telephone Encounter (Signed)
Called back and spoke with his wife. She states her husband has COPD and chest congestion. When he is coughing it sounds like something is there in his chest but it is not moving.   She said he always coughs because of the COPD, but lately the cough is worse in the morning than at night, she noticed he has been using the nebulizer 3 times a day in addition to the Trelegy once a day.  Has noticed he will get up at 3 in the morning and use nebulizer. Thinks he may be using it too often.  The patient was not in the home at the time of the call so I was not able to ask covid screening questions  Patient wife stated her husband has PTSD and was diagnosed with early onset dementia. She stated he gets upset with her when she asks him how he is feeling and if he is ok.  She confirmed he has been using mucinex for the cough and using flonase. And wanted to know if the benzonatate can be increased to 45 pills instead of 30? And if the prescription can be approved for a 90 day supply since he is right risk due to COVID at this time.  Message above routed to app of the day to review.  Tammy, can you please take a look at the above and advise if a refill of the benzonatate will be appropriate? Or does he need to come into clinic for evaluation? Thank you.

## 2018-08-13 NOTE — Telephone Encounter (Signed)
Spouse returned call Advised per TP suggestion Pt/spouse are afraid to come to doctors office due to possible exposure. They would like rx refill Tessalon sent to pharmacy. They verbalize understanding if not improved office visit needed not televisit. Nothing further needed.

## 2018-08-13 NOTE — Telephone Encounter (Signed)
Would be better for visit , unclear if could be done as televisit  If not then can given Tessalon #[phone] refills   If not improving or worsens will need ov  Please contact office for sooner follow up if symptoms do not improve or worsen or seek emergency care

## 2018-08-13 NOTE — Telephone Encounter (Signed)
LMTCB

## 2018-08-13 NOTE — Telephone Encounter (Signed)
Pt's  wife Harvest Dark is calling back [phone]

## 2018-08-18 ENCOUNTER — Encounter: Payer: Medicare Other | Admitting: Thoracic Surgery (Cardiothoracic Vascular Surgery)

## 2018-08-27 ENCOUNTER — Telehealth: Payer: Self-pay | Admitting: Internal Medicine

## 2018-08-27 NOTE — Telephone Encounter (Signed)
Returned call spoke with patient and spouse. They say pt is not able to do any activity more than 10 mins w/o being sob. He is using inhalers and nebs. They are wondering if patient may need 02. Explained this can't be assessed over the phone and pt would need to be assessed in office. OV scheduled per pt requested with TP. Nothing further needed. COVID screening neg.

## 2018-08-31 ENCOUNTER — Inpatient Hospital Stay (HOSPITAL_COMMUNITY): Payer: Medicare Other

## 2018-08-31 ENCOUNTER — Emergency Department (HOSPITAL_COMMUNITY): Payer: Medicare Other

## 2018-08-31 ENCOUNTER — Other Ambulatory Visit: Payer: Self-pay

## 2018-08-31 ENCOUNTER — Encounter (HOSPITAL_COMMUNITY): Payer: Self-pay | Admitting: *Deleted

## 2018-08-31 ENCOUNTER — Inpatient Hospital Stay (HOSPITAL_COMMUNITY)
Admission: EM | Admit: 2018-08-31 | Discharge: 2018-09-21 | DRG: 207 | Disposition: A | Discharge status: Rehabilitation Facility | Payer: Medicare Other | Attending: Internal Medicine | Admitting: Internal Medicine

## 2018-08-31 DIAGNOSIS — F329 Major depressive disorder, single episode, unspecified: Secondary | ICD-10-CM | POA: Diagnosis present

## 2018-08-31 DIAGNOSIS — J9601 Acute respiratory failure with hypoxia: Secondary | ICD-10-CM | POA: Diagnosis present

## 2018-08-31 DIAGNOSIS — Z87891 Personal history of nicotine dependence: Secondary | ICD-10-CM

## 2018-08-31 DIAGNOSIS — I509 Heart failure, unspecified: Secondary | ICD-10-CM | POA: Diagnosis not present

## 2018-08-31 DIAGNOSIS — G4730 Sleep apnea, unspecified: Secondary | ICD-10-CM | POA: Diagnosis present

## 2018-08-31 DIAGNOSIS — J189 Pneumonia, unspecified organism: Secondary | ICD-10-CM

## 2018-08-31 DIAGNOSIS — Z0189 Encounter for other specified special examinations: Secondary | ICD-10-CM

## 2018-08-31 DIAGNOSIS — J449 Chronic obstructive pulmonary disease, unspecified: Secondary | ICD-10-CM | POA: Diagnosis present

## 2018-08-31 DIAGNOSIS — E43 Unspecified severe protein-calorie malnutrition: Secondary | ICD-10-CM | POA: Diagnosis not present

## 2018-08-31 DIAGNOSIS — I610 Nontraumatic intracerebral hemorrhage in hemisphere, subcortical: Secondary | ICD-10-CM | POA: Diagnosis present

## 2018-08-31 DIAGNOSIS — E870 Hyperosmolality and hypernatremia: Secondary | ICD-10-CM | POA: Diagnosis not present

## 2018-08-31 DIAGNOSIS — J441 Chronic obstructive pulmonary disease with (acute) exacerbation: Secondary | ICD-10-CM | POA: Diagnosis present

## 2018-08-31 DIAGNOSIS — I5043 Acute on chronic combined systolic (congestive) and diastolic (congestive) heart failure: Secondary | ICD-10-CM | POA: Diagnosis present

## 2018-08-31 DIAGNOSIS — I169 Hypertensive crisis, unspecified: Secondary | ICD-10-CM | POA: Diagnosis present

## 2018-08-31 DIAGNOSIS — Z86711 Personal history of pulmonary embolism: Secondary | ICD-10-CM | POA: Diagnosis not present

## 2018-08-31 DIAGNOSIS — Z01818 Encounter for other preprocedural examination: Secondary | ICD-10-CM

## 2018-08-31 DIAGNOSIS — Z79899 Other long term (current) drug therapy: Secondary | ICD-10-CM | POA: Diagnosis not present

## 2018-08-31 DIAGNOSIS — F05 Delirium due to known physiological condition: Secondary | ICD-10-CM

## 2018-08-31 DIAGNOSIS — I611 Nontraumatic intracerebral hemorrhage in hemisphere, cortical: Secondary | ICD-10-CM | POA: Diagnosis not present

## 2018-08-31 DIAGNOSIS — Z825 Family history of asthma and other chronic lower respiratory diseases: Secondary | ICD-10-CM

## 2018-08-31 DIAGNOSIS — N171 Acute kidney failure with acute cortical necrosis: Secondary | ICD-10-CM | POA: Diagnosis present

## 2018-08-31 DIAGNOSIS — Z681 Body mass index (BMI) 19 or less, adult: Secondary | ICD-10-CM

## 2018-08-31 DIAGNOSIS — G4733 Obstructive sleep apnea (adult) (pediatric): Secondary | ICD-10-CM | POA: Diagnosis present

## 2018-08-31 DIAGNOSIS — E785 Hyperlipidemia, unspecified: Secondary | ICD-10-CM | POA: Diagnosis present

## 2018-08-31 DIAGNOSIS — R739 Hyperglycemia, unspecified: Secondary | ICD-10-CM | POA: Diagnosis not present

## 2018-08-31 DIAGNOSIS — I5023 Acute on chronic systolic (congestive) heart failure: Secondary | ICD-10-CM | POA: Diagnosis not present

## 2018-08-31 DIAGNOSIS — E162 Hypoglycemia, unspecified: Secondary | ICD-10-CM | POA: Diagnosis not present

## 2018-08-31 DIAGNOSIS — R410 Disorientation, unspecified: Secondary | ICD-10-CM | POA: Diagnosis not present

## 2018-08-31 DIAGNOSIS — Z9289 Personal history of other medical treatment: Secondary | ICD-10-CM

## 2018-08-31 DIAGNOSIS — Z7982 Long term (current) use of aspirin: Secondary | ICD-10-CM

## 2018-08-31 DIAGNOSIS — Z20828 Contact with and (suspected) exposure to other viral communicable diseases: Secondary | ICD-10-CM | POA: Diagnosis present

## 2018-08-31 DIAGNOSIS — I420 Dilated cardiomyopathy: Secondary | ICD-10-CM | POA: Diagnosis not present

## 2018-08-31 DIAGNOSIS — N179 Acute kidney failure, unspecified: Secondary | ICD-10-CM | POA: Diagnosis not present

## 2018-08-31 DIAGNOSIS — Z8249 Family history of ischemic heart disease and other diseases of the circulatory system: Secondary | ICD-10-CM

## 2018-08-31 DIAGNOSIS — I959 Hypotension, unspecified: Secondary | ICD-10-CM | POA: Diagnosis present

## 2018-08-31 DIAGNOSIS — I214 Non-ST elevation (NSTEMI) myocardial infarction: Secondary | ICD-10-CM | POA: Diagnosis not present

## 2018-08-31 DIAGNOSIS — I2699 Other pulmonary embolism without acute cor pulmonale: Secondary | ICD-10-CM | POA: Diagnosis not present

## 2018-08-31 DIAGNOSIS — F4310 Post-traumatic stress disorder, unspecified: Secondary | ICD-10-CM | POA: Diagnosis present

## 2018-08-31 DIAGNOSIS — N390 Urinary tract infection, site not specified: Secondary | ICD-10-CM | POA: Diagnosis not present

## 2018-08-31 DIAGNOSIS — I110 Hypertensive heart disease with heart failure: Secondary | ICD-10-CM | POA: Diagnosis present

## 2018-08-31 DIAGNOSIS — I10 Essential (primary) hypertension: Secondary | ICD-10-CM | POA: Diagnosis not present

## 2018-08-31 DIAGNOSIS — R1310 Dysphagia, unspecified: Secondary | ICD-10-CM | POA: Diagnosis present

## 2018-08-31 DIAGNOSIS — J9690 Respiratory failure, unspecified, unspecified whether with hypoxia or hypercapnia: Secondary | ICD-10-CM

## 2018-08-31 DIAGNOSIS — I429 Cardiomyopathy, unspecified: Secondary | ICD-10-CM | POA: Diagnosis not present

## 2018-08-31 DIAGNOSIS — G9341 Metabolic encephalopathy: Secondary | ICD-10-CM | POA: Diagnosis not present

## 2018-08-31 DIAGNOSIS — R6520 Severe sepsis without septic shock: Secondary | ICD-10-CM | POA: Diagnosis not present

## 2018-08-31 DIAGNOSIS — F0390 Unspecified dementia without behavioral disturbance: Secondary | ICD-10-CM | POA: Diagnosis present

## 2018-08-31 DIAGNOSIS — R5381 Other malaise: Secondary | ICD-10-CM | POA: Diagnosis present

## 2018-08-31 DIAGNOSIS — I612 Nontraumatic intracerebral hemorrhage in hemisphere, unspecified: Secondary | ICD-10-CM | POA: Diagnosis not present

## 2018-08-31 DIAGNOSIS — A419 Sepsis, unspecified organism: Secondary | ICD-10-CM | POA: Diagnosis not present

## 2018-08-31 DIAGNOSIS — G9340 Encephalopathy, unspecified: Secondary | ICD-10-CM | POA: Diagnosis not present

## 2018-08-31 DIAGNOSIS — I5031 Acute diastolic (congestive) heart failure: Secondary | ICD-10-CM | POA: Diagnosis not present

## 2018-08-31 DIAGNOSIS — Z7951 Long term (current) use of inhaled steroids: Secondary | ICD-10-CM | POA: Diagnosis not present

## 2018-08-31 DIAGNOSIS — F410 Panic disorder [episodic paroxysmal anxiety] without agoraphobia: Secondary | ICD-10-CM | POA: Diagnosis not present

## 2018-08-31 DIAGNOSIS — Z781 Physical restraint status: Secondary | ICD-10-CM

## 2018-08-31 DIAGNOSIS — I5041 Acute combined systolic (congestive) and diastolic (congestive) heart failure: Secondary | ICD-10-CM | POA: Diagnosis not present

## 2018-08-31 DIAGNOSIS — I680 Cerebral amyloid angiopathy: Secondary | ICD-10-CM | POA: Diagnosis not present

## 2018-08-31 DIAGNOSIS — I82409 Acute embolism and thrombosis of unspecified deep veins of unspecified lower extremity: Secondary | ICD-10-CM | POA: Diagnosis not present

## 2018-08-31 DIAGNOSIS — I5021 Acute systolic (congestive) heart failure: Secondary | ICD-10-CM | POA: Diagnosis not present

## 2018-08-31 DIAGNOSIS — Z4659 Encounter for fitting and adjustment of other gastrointestinal appliance and device: Secondary | ICD-10-CM

## 2018-08-31 DIAGNOSIS — J9621 Acute and chronic respiratory failure with hypoxia: Secondary | ICD-10-CM | POA: Diagnosis present

## 2018-08-31 DIAGNOSIS — J9589 Other postprocedural complications and disorders of respiratory system, not elsewhere classified: Secondary | ICD-10-CM | POA: Diagnosis not present

## 2018-08-31 DIAGNOSIS — I2693 Single subsegmental pulmonary embolism without acute cor pulmonale: Secondary | ICD-10-CM | POA: Diagnosis present

## 2018-08-31 DIAGNOSIS — R4189 Other symptoms and signs involving cognitive functions and awareness: Secondary | ICD-10-CM | POA: Diagnosis present

## 2018-08-31 DIAGNOSIS — I619 Nontraumatic intracerebral hemorrhage, unspecified: Secondary | ICD-10-CM

## 2018-08-31 DIAGNOSIS — Z9911 Dependence on respirator [ventilator] status: Secondary | ICD-10-CM | POA: Diagnosis not present

## 2018-08-31 HISTORY — DX: Dilated cardiomyopathy: I42.0

## 2018-08-31 LAB — CBC WITH DIFFERENTIAL/PLATELET
Abs Immature Granulocytes: 0.08 10*3/uL — ABNORMAL HIGH (ref 0.00–0.07)
Basophils Absolute: 0.1 10*3/uL (ref 0.0–0.1)
Basophils Relative: 1 %
Eosinophils Absolute: 0.6 10*3/uL — ABNORMAL HIGH (ref 0.0–0.5)
Eosinophils Relative: 5 %
HCT: 54.4 % — ABNORMAL HIGH (ref 39.0–52.0)
Hemoglobin: 17.9 g/dL — ABNORMAL HIGH (ref 13.0–17.0)
Immature Granulocytes: 1 %
Lymphocytes Relative: 15 %
Lymphs Abs: 1.9 10*3/uL (ref 0.7–4.0)
MCH: 30.7 pg (ref 26.0–34.0)
MCHC: 32.9 g/dL (ref 30.0–36.0)
MCV: 93.3 fL (ref 80.0–100.0)
Monocytes Absolute: 0.8 10*3/uL (ref 0.1–1.0)
Monocytes Relative: 6 %
Neutro Abs: 9.6 10*3/uL — ABNORMAL HIGH (ref 1.7–7.7)
Neutrophils Relative %: 72 %
Platelets: 262 10*3/uL (ref 150–400)
RBC: 5.83 MIL/uL — ABNORMAL HIGH (ref 4.22–5.81)
RDW: 12.6 % (ref 11.5–15.5)
WBC: 13.1 10*3/uL — ABNORMAL HIGH (ref 4.0–10.5)
nRBC: 0 % (ref 0.0–0.2)

## 2018-08-31 LAB — SARS CORONAVIRUS 2 BY RT PCR (HOSPITAL ORDER, PERFORMED IN ~~LOC~~ HOSPITAL LAB): SARS Coronavirus 2: NEGATIVE

## 2018-08-31 LAB — LACTIC ACID, PLASMA
Lactic Acid, Venous: 1.6 mmol/L (ref 0.5–1.9)
Lactic Acid, Venous: 4.8 mmol/L (ref 0.5–1.9)
Lactic Acid, Venous: 4.4 mmol/L (ref 0.5–1.9)
Lactic Acid, Venous: 4.9 mmol/L (ref 0.5–1.9)

## 2018-08-31 LAB — URINALYSIS, ROUTINE W REFLEX MICROSCOPIC
Bacteria, UA: NONE SEEN
Bilirubin Urine: NEGATIVE
Glucose, UA: 150 mg/dL — AB
Hgb urine dipstick: NEGATIVE
Ketones, ur: 5 mg/dL — AB
Leukocytes,Ua: NEGATIVE
Nitrite: NEGATIVE
Protein, ur: 100 mg/dL — AB
Specific Gravity, Urine: 1.03 (ref 1.005–1.030)
WBC, UA: >50 WBC/hpf — ABNORMAL HIGH (ref 0–5)
pH: 5 (ref 5.0–8.0)

## 2018-08-31 LAB — ECHOCARDIOGRAM COMPLETE
Height: 75 in
Weight: 2687.85 oz

## 2018-08-31 LAB — MRSA PCR SCREENING: MRSA by PCR: NEGATIVE

## 2018-08-31 LAB — LIPID PANEL
Cholesterol: 159 mg/dL (ref 0–200)
HDL: 66 mg/dL (ref >40)
LDL Cholesterol: 83 mg/dL (ref 0–99)
Total CHOL/HDL Ratio: 2.4 RATIO
Triglycerides: 48 mg/dL (ref <150)
VLDL: 10 mg/dL (ref 0–40)

## 2018-08-31 LAB — COMPREHENSIVE METABOLIC PANEL
ALT: 14 U/L (ref 0–44)
AST: 34 U/L (ref 15–41)
Albumin: 4.6 g/dL (ref 3.5–5.0)
Alkaline Phosphatase: 85 U/L (ref 38–126)
Anion gap: 13 (ref 5–15)
BUN: 17 mg/dL (ref 8–23)
CO2: 22 mmol/L (ref 22–32)
Calcium: 10 mg/dL (ref 8.9–10.3)
Chloride: 103 mmol/L (ref 98–111)
Creatinine, Ser: 1.22 mg/dL (ref 0.61–1.24)
GFR calc Af Amer: >60 mL/min (ref >60)
GFR calc non Af Amer: 60 mL/min — ABNORMAL LOW (ref >60)
Glucose, Bld: 200 mg/dL — ABNORMAL HIGH (ref 70–99)
Potassium: 3.8 mmol/L (ref 3.5–5.1)
Sodium: 138 mmol/L (ref 135–145)
Total Bilirubin: 1 mg/dL (ref 0.3–1.2)
Total Protein: 8.3 g/dL — ABNORMAL HIGH (ref 6.5–8.1)

## 2018-08-31 LAB — GLUCOSE, CAPILLARY
Glucose-Capillary: 107 mg/dL — ABNORMAL HIGH (ref 70–99)
Glucose-Capillary: 138 mg/dL — ABNORMAL HIGH (ref 70–99)
Glucose-Capillary: 88 mg/dL (ref 70–99)
Glucose-Capillary: 93 mg/dL (ref 70–99)

## 2018-08-31 LAB — BLOOD GAS, ARTERIAL
Acid-base deficit: 4.4 mmol/L — ABNORMAL HIGH (ref 0.0–2.0)
Bicarbonate: 21 mmol/L (ref 20.0–28.0)
FIO2: 100
O2 Saturation: 99.4 %
Patient temperature: 37
pCO2 arterial: 37 mmHg (ref 32.0–48.0)
pH, Arterial: 7.355 (ref 7.350–7.450)
pO2, Arterial: 483 mmHg — ABNORMAL HIGH (ref 83.0–108.0)

## 2018-08-31 LAB — RESPIRATORY PANEL BY PCR

## 2018-08-31 LAB — RAPID URINE DRUG SCREEN, HOSP PERFORMED
Amphetamines: NOT DETECTED
Barbiturates: NOT DETECTED
Benzodiazepines: POSITIVE — AB
Cocaine: NOT DETECTED
Opiates: NOT DETECTED
Tetrahydrocannabinol: NOT DETECTED

## 2018-08-31 LAB — BRAIN NATRIURETIC PEPTIDE: B Natriuretic Peptide: 285 pg/mL — ABNORMAL HIGH (ref 0.0–100.0)

## 2018-08-31 LAB — TROPONIN I
Troponin I: 0.04 ng/mL (ref <0.03)
Troponin I: 0.79 ng/mL (ref <0.03)
Troponin I: 1.91 ng/mL (ref <0.03)

## 2018-08-31 LAB — HEMOGLOBIN A1C
Hgb A1c MFr Bld: 5.6 % (ref 4.8–5.6)
Mean Plasma Glucose: 114.02 mg/dL

## 2018-08-31 LAB — TRIGLYCERIDES: Triglycerides: 78 mg/dL (ref <150)

## 2018-08-31 MED ORDER — ACETAMINOPHEN 160 MG/5ML PO SOLN
650.0000 mg | ORAL | Status: DC | PRN
  Administered 2018-09-03 – 2018-09-13 (×6): 650 mg
  Filled 2018-08-31 (×6): qty 20.3

## 2018-08-31 MED ORDER — CLEVIDIPINE BUTYRATE 0.5 MG/ML IV EMUL
0.0000 mg/h | INTRAVENOUS | Status: DC
  Filled 2018-08-31: qty 50

## 2018-08-31 MED ORDER — PROPOFOL 1000 MG/100ML IV EMUL
INTRAVENOUS | Status: AC
  Administered 2018-08-31: 05:00:00 5 ug/kg/min via INTRAVENOUS
  Filled 2018-08-31: qty 100

## 2018-08-31 MED ORDER — MAGNESIUM SULFATE 2 GM/50ML IV SOLN
2.0000 g | Freq: Once | INTRAVENOUS | Status: AC
  Administered 2018-08-31: 2 g via INTRAVENOUS

## 2018-08-31 MED ORDER — SODIUM CHLORIDE 0.9 % IV BOLUS
1000.0000 mL | Freq: Once | INTRAVENOUS | Status: AC
  Administered 2018-08-31: 1000 mL via INTRAVENOUS

## 2018-08-31 MED ORDER — ORAL CARE MOUTH RINSE
15.0000 mL | OROMUCOSAL | Status: DC
  Administered 2018-08-31 – 2018-09-01 (×9): 15 mL via OROMUCOSAL

## 2018-08-31 MED ORDER — IPRATROPIUM BROMIDE 0.02 % IN SOLN
0.5000 mg | Freq: Once | RESPIRATORY_TRACT | Status: AC
  Administered 2018-08-31: 0.5 mg via RESPIRATORY_TRACT
  Filled 2018-08-31: qty 2.5

## 2018-08-31 MED ORDER — ACETAMINOPHEN 325 MG PO TABS
650.0000 mg | ORAL_TABLET | ORAL | Status: DC | PRN
  Administered 2018-09-03: 650 mg via ORAL
  Filled 2018-08-31: qty 2

## 2018-08-31 MED ORDER — PRAVASTATIN SODIUM 10 MG PO TABS
10.0000 mg | ORAL_TABLET | Freq: Every day | ORAL | Status: DC
  Administered 2018-08-31 – 2018-09-20 (×20): 10 mg
  Filled 2018-08-31 (×21): qty 1

## 2018-08-31 MED ORDER — STROKE: EARLY STAGES OF RECOVERY BOOK
Freq: Once | Status: DC
  Filled 2018-08-31: qty 1

## 2018-08-31 MED ORDER — SODIUM CHLORIDE 0.9 % IV SOLN
1.0000 g | INTRAVENOUS | Status: DC
  Administered 2018-09-01 – 2018-09-03 (×3): 1 g via INTRAVENOUS
  Filled 2018-08-31 (×3): qty 10

## 2018-08-31 MED ORDER — CHLORHEXIDINE GLUCONATE 0.12% ORAL RINSE (MEDLINE KIT)
15.0000 mL | Freq: Two times a day (BID) | OROMUCOSAL | Status: DC
  Administered 2018-08-31 – 2018-09-01 (×3): 15 mL via OROMUCOSAL

## 2018-08-31 MED ORDER — SODIUM CHLORIDE 0.9 % IV SOLN
1.0000 g | Freq: Once | INTRAVENOUS | Status: AC
  Administered 2018-08-31: 1 g via INTRAVENOUS
  Filled 2018-08-31: qty 10

## 2018-08-31 MED ORDER — ETOMIDATE 2 MG/ML IV SOLN
INTRAVENOUS | Status: AC | PRN
  Administered 2018-08-31: 20 mg via INTRAVENOUS

## 2018-08-31 MED ORDER — PROPOFOL 1000 MG/100ML IV EMUL
0.0000 ug/kg/min | INTRAVENOUS | Status: DC
  Administered 2018-08-31: 40 ug/kg/min via INTRAVENOUS
  Administered 2018-08-31: 20 ug/kg/min via INTRAVENOUS
  Administered 2018-08-31: 35 ug/kg/min via INTRAVENOUS
  Administered 2018-08-31: 05:00:00 5 ug/kg/min via INTRAVENOUS
  Administered 2018-09-01: 45 ug/kg/min via INTRAVENOUS
  Administered 2018-09-01: 40 ug/kg/min via INTRAVENOUS
  Filled 2018-08-31 (×5): qty 100

## 2018-08-31 MED ORDER — ACETAMINOPHEN 650 MG RE SUPP: 650.0000 mg | RECTAL | Status: DC | PRN

## 2018-08-31 MED ORDER — SENNOSIDES-DOCUSATE SODIUM 8.6-50 MG PO TABS
1.0000 | ORAL_TABLET | Freq: Two times a day (BID) | ORAL | Status: DC
  Administered 2018-08-31 – 2018-09-11 (×16): 1 via ORAL
  Filled 2018-08-31 (×16): qty 1

## 2018-08-31 MED ORDER — ROCURONIUM BROMIDE 50 MG/5ML IV SOLN
INTRAVENOUS | Status: AC | PRN
  Administered 2018-08-31: 100 mg via INTRAVENOUS

## 2018-08-31 MED ORDER — ALBUTEROL (5 MG/ML) CONTINUOUS INHALATION SOLN
10.0000 mg/h | INHALATION_SOLUTION | Freq: Once | RESPIRATORY_TRACT | Status: AC
  Administered 2018-08-31: 10 mg/h via RESPIRATORY_TRACT
  Filled 2018-08-31: qty 20

## 2018-08-31 MED ORDER — NITROGLYCERIN IN D5W 200-5 MCG/ML-% IV SOLN
5.0000 ug/min | INTRAVENOUS | Status: DC
  Administered 2018-08-31: 50 ug/min via INTRAVENOUS
  Filled 2018-08-31: qty 250

## 2018-08-31 MED ORDER — SODIUM CHLORIDE 0.9 % IV SOLN
500.0000 mg | Freq: Once | INTRAVENOUS | Status: DC
  Filled 2018-08-31: qty 500

## 2018-08-31 MED ORDER — FUROSEMIDE 10 MG/ML IJ SOLN
60.0000 mg | Freq: Once | INTRAMUSCULAR | Status: AC
  Administered 2018-08-31: 60 mg via INTRAVENOUS
  Filled 2018-08-31: qty 6

## 2018-08-31 MED ORDER — FENTANYL CITRATE (PF) 100 MCG/2ML IJ SOLN
50.0000 ug | INTRAMUSCULAR | Status: DC | PRN
  Administered 2018-08-31: 50 ug via INTRAVENOUS
  Filled 2018-08-31: qty 2

## 2018-08-31 MED ORDER — FENTANYL CITRATE (PF) 100 MCG/2ML IJ SOLN
50.0000 ug | INTRAMUSCULAR | Status: AC | PRN
  Administered 2018-08-31 (×3): 50 ug via INTRAVENOUS
  Filled 2018-08-31 (×3): qty 2

## 2018-08-31 MED ORDER — MAGNESIUM SULFATE 2 GM/50ML IV SOLN
INTRAVENOUS | Status: AC
  Administered 2018-08-31: 2 g via INTRAVENOUS
  Filled 2018-08-31: qty 50

## 2018-08-31 MED ORDER — LABETALOL HCL 5 MG/ML IV SOLN: 20.0000 mg | Freq: Once | INTRAVENOUS | Status: DC

## 2018-08-31 MED ORDER — PANTOPRAZOLE SODIUM 40 MG IV SOLR
40.0000 mg | Freq: Every day | INTRAVENOUS | Status: DC
  Administered 2018-08-31 – 2018-09-06 (×7): 40 mg via INTRAVENOUS
  Filled 2018-08-31 (×7): qty 40

## 2018-08-31 MED ORDER — CHLORHEXIDINE GLUCONATE CLOTH 2 % EX PADS
6.0000 | MEDICATED_PAD | Freq: Every day | CUTANEOUS | Status: DC
  Administered 2018-09-02 – 2018-09-21 (×20): 6 via TOPICAL

## 2018-08-31 MED ORDER — METHYLPREDNISOLONE SODIUM SUCC 125 MG IJ SOLR
125.0000 mg | Freq: Once | INTRAMUSCULAR | Status: AC
  Administered 2018-08-31: 125 mg via INTRAVENOUS
  Filled 2018-08-31: qty 2

## 2018-08-31 MED ORDER — IOHEXOL 350 MG/ML SOLN
100.0000 mL | Freq: Once | INTRAVENOUS | Status: AC | PRN
  Administered 2018-08-31: 100 mL via INTRAVENOUS

## 2018-08-31 MED ORDER — SODIUM CHLORIDE 0.9 % IV SOLN
INTRAVENOUS | Status: DC
  Administered 2018-08-31 – 2018-09-08 (×3): via INTRAVENOUS

## 2018-08-31 MED ORDER — ALBUTEROL SULFATE (2.5 MG/3ML) 0.083% IN NEBU
2.5000 mg | INHALATION_SOLUTION | RESPIRATORY_TRACT | Status: DC | PRN
  Administered 2018-09-01 – 2018-09-21 (×6): 2.5 mg via RESPIRATORY_TRACT
  Filled 2018-08-31 (×6): qty 3

## 2018-08-31 MED ORDER — INSULIN ASPART 100 UNIT/ML ~~LOC~~ SOLN
0.0000 [IU] | SUBCUTANEOUS | Status: DC
  Administered 2018-08-31: 2 [IU] via SUBCUTANEOUS

## 2018-08-31 NOTE — H&P (Addendum)
NAME:  Jeremiah [name], MRN:  [id number], DOB:  Aug 11, 1945, LOS: 0 ADMISSION DATE:  08/31/2018, CONSULTATION DATE:  08/31/18 REFERRING MD:  [facility] - [facility]  CHIEF COMPLAINT:  SOB   Brief History   Jeremiah [name] is a 73 y.o. male who presented to [facility] with SOB felt to be due to AECOPD.  Required intubation after failing NIMV.  Incidentally found to have small LLL PE and small 78m left frontal lobe hemorrhage; therefore, [facility]  History of present illness   Pt is encephelopathic; therefore, this HPI is obtained from chart review. Jeremiah Remmertis a 73y.o. male who has a PMH as outlined below including Gold III COPD for which he is followed by Dr. RChase Caller(see "past medical history").  He presented to [facility] 5/18 with SOB.  He was placed on NIMV but failed and required intubation.  In ED, had significant bronchospasm, felt to be due to AECOPD. Per wife, SOB had began 1 week prior and progressively worsened.    CTA chest with incidental finding small LLL PE.  CT head with incidental finding small 531mleft frontal lobe hemorrhage.  Heparin deferred in lieu of this.  COVID negative.  In addition, EKG demonstrated J point elevation.  Discussed with cardiology who recommended trending troponins.  No emergent procedure indicated.  [facility]  Past Medical History  COPD, OSA, HTN, PTSD, Depression.  Significant Hospital Events   5/18 > admit.  Consults:  None.  Procedures:  ETT 5/18 >   Significant Diagnostic Tests:  CXR 5/18 > COPD. CT head 5/18 > 80m42mubcortical hemorrhage in left frontal lobe.  Chronic white matter disease, question amyloid angiopathy. CTA chest 5/18 > single subsegmental PE in LLL.  Emphysema.  LE duplex 5/18 >   Micro Data:  Blood 5/18 >  Sputum 5/18 >  RVP 5/18 >  SARS CoV2 5/18 >   Antimicrobials:  Azithromycin 5/18 x 1 Ceftriaxone 5/18 >   Interim history/subjective:  Comfortable on vent.  Sedated.   Objective:  Blood pressure (!) 85/74, pulse 91, temperature (!) 97.3 F (36.3 C), resp. rate 20, height '6\' 3"'$  (1.905 m), weight 76.2 kg, SpO2 100 %.    Vent Mode: PRVC FiO2 (%):  [45 %-100 %] 45 % Set Rate:  [20 bmp] 20 bmp Vt Set:  [670 mL] 670 mL PEEP:  [5 cmH20] 5 cmH20 Plateau Pressure:  [14 cmH20-24 cmH20] 14 cmH20   Intake/Output Summary (Last 24 hours) at 08/31/2018 0948 Last data filed at 08/31/2018 0831 Gross per 24 hour  Intake 2188.96 ml  Output -  Net 2188.96 ml   Filed Weights   08/31/18 0439  Weight: 76.2 kg    Examination: General: Adult male, in NAD. Neuro: Sedated, not following commands. HEENT: [place]/AT. Sclerae anicteric.  ETT in place. Cardiovascular: RRR, no M/R/G.  Lungs: Respirations even and unlabored.  CTA bilaterally, No W/R/R.  Abdomen: BS x 4, soft, NT/ND.  Musculoskeletal: No gross deformities, no edema.  Skin: Intact, warm, no rashes.  Assessment & Plan:   AECOPD - required intubation after failing NIMV. - Continue full vent support. - Assess ABG. - Bronchial hygiene. - Empiric ceftriaxone. - Defer steroids as not bronchospastic. - Assess RVP, sputum culture. - Follow CXR.  Small LLL PE. - Assess LE duplex, echo. - Defer heparin given small left frontal lobe hemorrhage.  Elevated lactate - presumed due to respiratory distress. - Trend lactate.  J point elevation -  case discussed with cardiology in ED who recommended trending troponin and heparin (however, heparin held due to left frontal lobe hemorrhage). - Trend troponins. - Assess echo. - Defer heparin as above.  Small left frontal lobe hemorrhage. - Neuro consult once extubated. - Defer follow up imaging to neuro.  Hx HTN, HLD. - Hold preadmission amlodipine, ASA. - Continue preadmission pravastatin.  Hx depression. - Hold preadmission trazodone, alprazolam.  Best Practice:  Diet: NPO. Pain/Anxiety/Delirium protocol (if indicated): Propofol gtt / Fentanyl PRN.  RASS  goal 0 to -1. VAP protocol (if indicated): In place DVT prophylaxis: None until LE duplex completed and DVT ruled out.  No heparin due to [place]. GI prophylaxis: PPI. Glucose control: SSI. Mobility: Bedrest. Code Status: Full. Family Communication: Wife updated over the phone. Disposition: ICU.  Labs   CBC: Recent Labs  Lab 08/31/18 0458  WBC 13.1*  NEUTROABS 9.6*  HGB 17.9*  HCT 54.4*  MCV 93.3  PLT 364   Basic Metabolic Panel: Recent Labs  Lab 08/31/18 0458  NA 138  K 3.8  CL 103  CO2 22  GLUCOSE 200*  BUN 17  CREATININE 1.22  CALCIUM 10.0   GFR: Estimated Creatinine Clearance: 60.7 mL/min (by C-G formula based on SCr of 1.22 mg/dL). Recent Labs  Lab 08/31/18 0458 08/31/18 0459 08/31/18 0646  WBC 13.1*  --   --   LATICACIDVEN  --  1.6 4.8*   Liver Function Tests: Recent Labs  Lab 08/31/18 0458  AST 34  ALT 14  ALKPHOS 85  BILITOT 1.0  PROT 8.3*  ALBUMIN 4.6   No results for input(s): LIPASE, AMYLASE in the last 168 hours. No results for input(s): AMMONIA in the last 168 hours. ABG    Component Value Date/Time   PHART 7.355 08/31/2018 0433   PCO2ART 37.0 08/31/2018 0433   PO2ART 483 (H) 08/31/2018 0433   HCO3 21.0 08/31/2018 0433   TCO2 21.1 04/25/2013 0416   ACIDBASEDEF 4.4 (H) 08/31/2018 0433   O2SAT 99.4 08/31/2018 0433    Coagulation Profile: No results for input(s): INR, PROTIME in the last 168 hours. Cardiac Enzymes: Recent Labs  Lab 08/31/18 0458  TROPONINI 0.04*   HbA1C: No results found for: HGBA1C CBG: No results for input(s): GLUCAP in the last 168 hours.  Review of Systems:   Unable to obtain as pt is encephalopathic.  Past medical history  He,  has a past medical history of COPD (chronic obstructive pulmonary disease) ([place]), Depression, Hypertension, PTSD (post-traumatic stress disorder), and Sleep apnea.   Surgical History    Past Surgical History:  Procedure Laterality Date  . HAMMER TOE SURGERY  April 2017      Social History   reports that he quit smoking about 31 years ago. His smoking use included cigarettes. He has a 30.00 pack-year smoking history. He has never used smokeless tobacco. He reports current alcohol use of about 2.0 standard drinks of alcohol per week. He reports that he does not use drugs.   Family history   His family history includes Asthma in his maternal grandmother; Dementia in his mother; Heart disease in his father.   Allergies No Known Allergies   Home meds  Prior to Admission medications   Medication Sig Start Date End Date Taking? Authorizing Provider  albuterol (PROVENTIL HFA;VENTOLIN HFA) 108 (90 Base) MCG/ACT inhaler Inhale 2 puffs into the lungs every 4 (four) hours as needed for wheezing or shortness of breath (((PLAN B))). 04/14/18   Fenton Foy, NP  ALPRAZolam (XANAX) 0.5 MG tablet TAKE 1 TABLET BY MOUTH TWICE A DAY AS NEEDED FOR ANXIETY 12/04/15   Tanda Rockers, MD  AMLODIPINE BESYLATE PO Take 2.5 mg by mouth daily.    [provider]  aspirin EC 81 MG tablet Take 81 mg by mouth daily.    [provider]  B-D 3CC LUER-LOK SYR 21GX1-1/2 21G X 1-1/2" 3 ML MISC USE NEEDLE/SYRINGE TO INJECT TESTOSTERONE INTO SKIN ONCE EVERY 2 WEEKS. 11/10/15   [provider]  benzonatate (TESSALON) 100 MG capsule Take 1 capsule (100 mg total) by mouth 3 (three) times daily. 08/13/18   Parrett, Fonnie Mu, NP  dextromethorphan-guaiFENesin (MUCINEX DM) 30-600 MG per 12 hr tablet Take 55m every 4 hours as needed with flutter valve for cough and congestion    [provider]  doxycycline (VIBRA-TABS) 100 MG tablet Take 1 tablet (100 mg total) by mouth 2 (two) times daily. 05/11/18   RBrand Males MD  erythromycin ophthalmic ointment Place into both eyes nightly. 10/30/16   [provider]  esomeprazole (NEXIUM) 40 MG capsule Take 40 mg by mouth daily before breakfast.  03/02/15   [provider]  famotidine (PEPCID) 20 MG tablet  Take 20 mg by mouth at bedtime.    [provider]  fluticasone (FLONASE) 50 MCG/ACT nasal spray Place 2 sprays into both nostrils 2 (two) times daily.    [provider]  magnesium oxide (MAG-OX) 400 MG tablet Take 400 mg by mouth 2 (two) times daily. For leg cramps    [provider]  Multiple Vitamins-Minerals (CENTRUM SILVER 50+MEN) TABS Take 1 tablet by mouth daily.    [provider]  nystatin (MYCOSTATIN) 100000 UNIT/ML suspension Take 5 mLs (500,000 Units total) by mouth 4 (four) times daily. 07/30/18   NFenton Foy NP  pravastatin (PRAVACHOL) 10 MG tablet Take 10 mg by mouth daily.    [provider]  predniSONE (DELTASONE) 10 MG tablet 40 mg x1 day, then 30 mg x1 day, then 20 mg x1 day, then 10 mg x1 day, and then 5 mg x1 day and stop 05/11/18   RBrand Males MD  Respiratory Therapy Supplies (FLUTTER) DEVI Use as directed 04/04/15   WTanda Rockers MD  Respiratory Therapy Supplies (NEBULIZER COMPRESSOR) KIT Use as directed DX: J44.9 02/10/18   RBrand Males MD  traZODone (DESYREL) 50 MG tablet Take 1 tablet by mouth at bedtime as needed. 12/27/15   [provider]  TRELEGY ELLIPTA 100-62.5-25 MCG/INH AEPB TAKE 1 PUFF BY MOUTH EVERY DAY 07/13/18   RBrand Males MD    Critical care time: 35 min.    RMontey Hora POnsetPulmonary & Critical Care Medicine Pager: [phone]  If no answer, [phone] - [id number]/18/2020, 9:48 AM  Attending Note:  73year old male with PMH of COPD who presents to [facility] with respiratory failure, with small 5 mm ICH on CT that I reviewed myself, a small PE on the LLL and J point elevation with normal troponin (ER called cards who said monitor clinically and trend troponin call if increases).  On exam, decreased BS diffusely.  I reviewed labs, COVID is negative.  Will hold heparin.  Continue full vent support.  Adjust vent for ABG.  Hold off weaning for today.  Solumedrol and  rocephin.  BD as ordered.  Propofol for sedation.  PRN fentanyl.  PCCM will continue to manage.  The patient is critically ill  with multiple organ systems failure and requires high complexity decision making for assessment and support, frequent evaluation and titration of therapies, application of advanced monitoring technologies and extensive interpretation of multiple databases.   Critical Care Time devoted to patient care services described in this note is  40  Minutes. This time reflects time of care of this signee Dr Jennet Maduro. This critical care time does not reflect procedure time, or teaching time or supervisory time of PA/NP/Med student/Med Resident etc but could involve care discussion time.  Rush Farmer, M.D. [facility] Pulmonary/Critical Care Medicine. Pager: [phone]. After hours pager: [phone].

## 2018-08-31 NOTE — Progress Notes (Signed)
Lower extremity venous has been completed.   Preliminary results in CV Proc.   Abram Sander 08/31/2018 3:39 PM

## 2018-08-31 NOTE — Consult Note (Addendum)
Cardiology Consultation:   Patient ID: Jeremiah [name] MRN: [id number]; DOB: December 01, 1945  Admit date: 08/31/2018 Date of Consult: 08/31/2018  Primary Care Provider: Selena Lesser, MD Primary Cardiologist: New  Primary Electrophysiologist:  None    Patient Profile:   Jeremiah [name] is a 73 y.o. male with a hx of HTN, COPD and OSA who is being seen today for the evaluation of elevated troponin after intubated for COPD exacerbation and found to have a small PE and intracranial bleed at the request of Dr. Nelda Marseille.  History of Present Illness:   Jeremiah [name] is a 73 year old male was past medical history of hypertension, COPD and obstructive sleep apnea.  He does not have prior cardiac history.  He did have a stress test on 04/30/2016 by Dr. Einar Gip which showed EF 55%, soft tissue attenuation artifact, no obvious ischemia, overall low risk study.  He previously quit smoking in 1989 and that has been followed by Dr. Chase Caller for COPD.  He presented to an apparent hospital on 08/31/2018 with acute respiratory failure.  He was placed on oxygen however later required intubation.  CT angiogram of the chest showed a small left lower lobe PE.  CT of the head also showed incidental finding of 5 mm left frontal lobe hemorrhage.  Patient has been seen by neurosurgery.  Initial troponin was 0.04, subsequent troponin trended up to 0.75 and eventually at 1.91.  BNP was 285.  Creatinine 1.22.  White blood cell count 13.1.Blood cultures currently pending.  Respiratory panel was negative for COVID-19.  Echocardiogram obtained on 08/31/2018 showed EF 20 to 25%, severe apical hypokinesis with preserved base which may suggest a possible cardiomyopathy versus multivessel coronary disease, grade 2 DD, possible bicuspid aortic valve. Lower extremity venous Doppler was negative for DVT. Cardiology has been consulted for elevated troponin was abnormal echocardiogram.  Although initial EKG showed very pronounced J-point elevation  diffusely, this was in the setting of heart rate of 140.  Second EKG showed improved heart rate with minimally elevated J-point diffusely without corresponding ST depression.   Past Medical History:  Diagnosis Date   COPD (chronic obstructive pulmonary disease) ([place])    Depression    Hypertension    PTSD (post-traumatic stress disorder)    Sleep apnea     Past Surgical History:  Procedure Laterality Date   HAMMER TOE SURGERY  April 2017     Home Medications:  Prior to Admission medications   Medication Sig Start Date End Date Taking? Authorizing Provider  albuterol (PROVENTIL HFA;VENTOLIN HFA) 108 (90 Base) MCG/ACT inhaler Inhale 2 puffs into the lungs every 4 (four) hours as needed for wheezing or shortness of breath (((PLAN B))). 04/14/18  Yes Fenton Foy, NP  albuterol (PROVENTIL) (2.5 MG/3ML) 0.083% nebulizer solution Take 2.5 mg by nebulization every 6 (six) hours as needed for wheezing or shortness of breath.   Yes [provider]  ALPRAZolam (XANAX) 0.5 MG tablet TAKE 1 TABLET BY MOUTH TWICE A DAY AS NEEDED FOR ANXIETY Patient taking differently: Take 0.5 mg by mouth 2 (two) times daily as needed for anxiety.  12/04/15  Yes Tanda Rockers, MD  amLODipine (NORVASC) 2.5 MG tablet Take 2.5 mg by mouth daily.    Yes [provider]  aspirin EC 81 MG tablet Take 81 mg by mouth daily.   Yes [provider]  benzonatate (TESSALON) 100 MG capsule Take 1 capsule (100 mg total) by mouth 3 (three) times daily. Patient taking differently: Take 100 mg by  mouth 3 (three) times daily as needed for cough.  08/13/18  Yes Parrett, Tammy S, NP  buPROPion (WELLBUTRIN XL) 150 MG 24 hr tablet Take 150 mg by mouth daily. 07/14/18  Yes [provider]  dextromethorphan-guaiFENesin (MUCINEX DM) 30-600 MG per 12 hr tablet Take 2 tablets by mouth 2 (two) times daily as needed (cough and congestion).    Yes [provider]  erythromycin ophthalmic  ointment Place 1 application into both eyes at bedtime.  10/30/16  Yes [provider]  esomeprazole (NEXIUM) 40 MG capsule Take 40 mg by mouth daily before breakfast.  03/02/15  Yes [provider]  famotidine (PEPCID) 20 MG tablet Take 20 mg by mouth at bedtime.   Yes [provider]  fluticasone (FLONASE) 50 MCG/ACT nasal spray Place 2 sprays into both nostrils 2 (two) times daily.   Yes [provider]  magnesium oxide (MAG-OX) 400 MG tablet Take 400 mg by mouth 2 (two) times daily. For leg cramps   Yes [provider]  pravastatin (PRAVACHOL) 10 MG tablet Take 10 mg by mouth daily.   Yes [provider]  Respiratory Therapy Supplies (FLUTTER) DEVI Use as directed 04/04/15  Yes Tanda Rockers, MD  Respiratory Therapy Supplies (NEBULIZER COMPRESSOR) KIT Use as directed DX: J44.9 02/10/18  Yes Brand Males, MD  sildenafil (VIAGRA) 100 MG tablet Take 100 mg by mouth daily as needed for erectile dysfunction. 06/23/18  Yes [provider]  tamsulosin (FLOMAX) 0.4 MG CAPS capsule Take 0.4 mg by mouth daily with supper. 08/08/18  Yes [provider]  traZODone (DESYREL) 50 MG tablet Take 1 tablet by mouth at bedtime as needed for sleep.  12/27/15  Yes [provider]  TRELEGY ELLIPTA 100-62.5-25 MCG/INH AEPB TAKE 1 PUFF BY MOUTH EVERY DAY Patient taking differently: Inhale 1 puff into the lungs daily.  07/13/18  Yes Brand Males, MD    Inpatient Medications: Scheduled Meds:   stroke: mapping our early stages of recovery book   Does not apply Once   chlorhexidine gluconate (MEDLINE KIT)  15 mL Mouth Rinse BID   Chlorhexidine Gluconate Cloth  6 each Topical Daily   insulin aspart  0-15 Units Subcutaneous Q4H   labetalol  20 mg Intravenous Once   mouth rinse  15 mL Mouth Rinse 10 times per day   pantoprazole (PROTONIX) IV  40 mg Intravenous QHS   pravastatin  10 mg Per Tube q1800   senna-docusate  1  tablet Oral BID   Continuous Infusions:  sodium chloride     [START ON 09/01/2018] cefTRIAXone (ROCEPHIN)  IV     clevidipine     propofol (DIPRIVAN) infusion 20 mcg/kg/min (08/31/18 1036)   PRN Meds: acetaminophen **OR** acetaminophen (TYLENOL) oral liquid 160 mg/5 mL **OR** acetaminophen, albuterol, fentaNYL (SUBLIMAZE) injection  Allergies:   No Known Allergies  Social History:   Social History   Socioeconomic History   Marital status: Married    Spouse name: Not on file   Number of children: 2   Years of education: 16   Highest education level: Not on file  Occupational History   Occupation: Retired [employer] strain: Not on file   Food insecurity:    Worry: Not on file    Inability: Not on file   Transportation needs:    Medical: Not on file    Non-medical: Not on file  Tobacco Use   Smoking status: Former Smoker  Packs/day: 1.00    Years: 30.00    Pack years: 30.00    Types: Cigarettes    Last attempt to quit: 04/16/1987    Years since quitting: 31.3   Smokeless tobacco: Never Used  Substance and Sexual Activity   Alcohol use: Yes    Alcohol/week: 2.0 standard drinks    Types: 2 Glasses of wine per week   Drug use: No   Sexual activity: Not on file  Lifestyle   Physical activity:    Days per week: Not on file    Minutes per session: Not on file   Stress: Not on file  Relationships   Social connections:    Talks on phone: Not on file    Gets together: Not on file    Attends religious service: Not on file    Active member of club or organization: Not on file    Attends meetings of clubs or organizations: Not on file    Relationship status: Not on file   Intimate partner violence:    Fear of current or ex partner: Not on file    Emotionally abused: Not on file    Physically abused: Not on file    Forced sexual activity: Not on file  Other Topics Concern   Not on file  Social History  Narrative   Fun: Live   Denies religious beliefs effecting health care.   Feels safe at home and denies abuse.     Family History:    Family History  Problem Relation Age of Onset   Asthma Maternal Grandmother    Heart disease Father    Dementia Mother      ROS:  Please see the history of present illness.   All other ROS reviewed and negative.     Physical Exam/Data:   Vitals:   08/31/18 1500 08/31/18 1600 08/31/18 1603 08/31/18 1700  BP: (!) 117/102 105/84 105/84 96/81  Pulse: 75 70 69 69  Resp: _0 Temp: 98.2 F (36.8 C) 98.4 F (36.9 C)  98.4 F (36.9 C)  TempSrc:  Bladder    SpO2: 100% 100% 100% 100%  Weight:      Height:        Intake/Output Summary (Last 24 hours) at 08/31/2018 1801 Last data filed at 08/31/2018 1738 Gross per 24 hour  Intake 3034.22 ml  Output 500 ml  Net 2534.22 ml   Last 3 Weights 08/31/2018 05/05/2018 04/14/2018  Weight (lbs) 167 lb 15.9 oz 168 lb 174 lb 12.8 oz  Weight (kg) 76.2 kg 76.204 kg 79.289 kg     Body mass index is 21 kg/m.  General: Intubated and sedated HEENT: normal Lymph: no adenopathy Neck: no JVD Endocrine:  No thryomegaly Vascular: FA pulses 2+ bilaterally without bruits  Cardiac:  normal S1, S2; RRR; no murmur  Lungs:  clear to auscultation bilaterally.  Occasional wheezing, no rhonchi or rale Abd: soft, no hepatomegaly  Ext: no edema Musculoskeletal:  No deformities, BUE and BLE strength normal and equal Skin: warm and dry  Neuro: Unable to assess Psych: Unable to assess  EKG:  The EKG was personally reviewed and demonstrates: Normal sinus rhythm with minimal J-point elevation diffusely Telemetry:  Telemetry was personally reviewed and demonstrates: Normal sinus rhythm.  Relevant CV Studies:  Echo 08/31/2018 IMPRESSIONS    1. The left ventricle has severely reduced systolic function, with an ejection fraction of 20-25%. The cavity size was normal. There is mildly increased left ventricular  wall thickness. Findings are consistent with Takatsubo cardiomyopathy or  multivessel CAD. Left ventricular diastolic Doppler parameters are consistent with pseudonormalization. Elevated left atrial and left ventricular end-diastolic pressures The E/e' is >15. Left ventricular diffuse hypokinesis.  2. The right ventricle has moderately reduced systolic function. The cavity was normal. There is mildly increased right ventricular wall thickness.  3. The mitral valve is abnormal. Mild thickening of the mitral valve leaflet. There is mild mitral annular calcification present.  4. The tricuspid valve is grossly normal.  5. The aortic valve appears bicuspid. Mild sclerosis of the aortic valve. Aortic valve regurgitation is trivial by color flow Doppler. No stenosis of the aortic valve.  6. The inferior vena cava was dilated in size with <50% respiratory variability.  SUMMARY   LVEF 20-25%, mild LVH, severe apical hypokinesis with relatively preserved base -may suggest Taksubo cardiomyopathy or multivessel coronary disease, grade 2 DD with elevated LV filling pressure, moderate RV systolic dysfunction, normal biatrial size, possibly bicuspid aortic valve, MAC with trivial MR, trivial TR, RVSP 38 mmHg, dilated IVC  Laboratory Data:  Chemistry Recent Labs  Lab 08/31/18 0458  NA 138  K 3.8  CL 103  CO2 22  GLUCOSE 200*  BUN 17  CREATININE 1.22  CALCIUM 10.0  GFRNONAA 60*  GFRAA >60  ANIONGAP 13    Recent Labs  Lab 08/31/18 0458  PROT 8.3*  ALBUMIN 4.6  AST 34  ALT 14  ALKPHOS 85  BILITOT 1.0   Hematology Recent Labs  Lab 08/31/18 0458  WBC 13.1*  RBC 5.83*  HGB 17.9*  HCT 54.4*  MCV 93.3  MCH 30.7  MCHC 32.9  RDW 12.6  PLT 262   Cardiac Enzymes Recent Labs  Lab 08/31/18 0458 08/31/18 0952 08/31/18 1536  TROPONINI 0.04* 0.79* 1.91*   No results for input(s): TROPIPOC in the last 168 hours.  BNP Recent Labs  Lab 08/31/18 0459  BNP 285.0*    DDimer No  results for input(s): DDIMER in the last 168 hours.  Radiology/Studies:  Ct Head Wo Contrast  Result Date: 08/31/2018 CLINICAL DATA:  Altered level of consciousness, unexplained EXAM: CT HEAD WITHOUT CONTRAST TECHNIQUE: Contiguous axial images were obtained from the base of the skull through the vertex without intravenous contrast. COMPARISON:  None similar FINDINGS: Brain: 5 mm ovoid high-density focus in the subcortical high left frontal region. Background of confluent low-density in the bilateral cerebral white matter. Linear high-density within a left parietal sulcus which has the appearance of a vessel on sagittal reformats. No evidence of acute gray matter infarct. No masslike finding or hydrocephalus. Vascular: No hyperdense vessel noted. Skull: Negative Sinuses/Orbits: The sinuses are essentially clear.  Negative orbits. Critical Value/emergent results were called by telephone at the time of interpretation on 08/31/2018 at 7:06 am to Dr. Ezequiel Essex , who verbally acknowledged these results. IMPRESSION: 1. 5 mm acute subcortical hemorrhage in the left frontal lobe. Extensive chronic white matter disease, question amyloid angiopathy given this location. 2. Attention to a minimally high-dense left parietal sulcus on follow-up. Electronically Signed   By: Monte Fantasia M.D.   On: 08/31/2018 07:06   Ct Angio Chest Pe W And/or Wo Contrast  Result Date: 08/31/2018 CLINICAL DATA:  Altered level of consciousness.  Elevated D-dimer EXAM: CT ANGIOGRAPHY CHEST WITH CONTRAST TECHNIQUE: Multidetector CT imaging of the chest was performed using the standard protocol during bolus administration of intravenous contrast. Multiplanar CT image reconstructions and MIPs were obtained to evaluate the vascular anatomy. CONTRAST:  133m OMNIPAQUE IOHEXOL 350 MG/ML SOLN COMPARISON:  02/20/2016 FINDINGS: Cardiovascular: Normal heart size. No pericardial effusion. Coronary calcification is present. There is no  opacification of the aorta due to contrast timing. A single subsegmental pulmonary embolism is seen in the left lower lobe. Proximally the filling defect is eccentric, but there does appear to be branching into the downstream vessel and this is presumably acute in this setting. No large or second pulmonary embolism is seen. There is motion artifact at the bases. Pulmonary arteries are not enlarged and there is no right ventricular dilatation in this patient with elevated troponin Mediastinum/Nodes: Negative for adenopathy. Lungs/Pleura: Centrilobular emphysema. Patchy airway debris without collapse. There is no edema, consolidation, effusion, or pneumothorax. Endotracheal tube in good position. Upper Abdomen: Orogastric tube in good position. Musculoskeletal: No acute or aggressive finding Critical Value/emergent results were called by telephone at the time of interpretation on 08/31/2018 at 7:04 am to Dr. SEzequiel Essex, who verbally acknowledged these results. Review of the MIP images confirms the above findings. IMPRESSION: 1. Single subsegmental pulmonary embolism to the left lower lobe. 2. Coronary atherosclerosis. 3. Emphysema. Electronically Signed   By: JMonte FantasiaM.D.   On: 08/31/2018 07:11   Dg Chest Portable 1 View  Result Date: 08/31/2018 CLINICAL DATA:  Endotracheal tube.  Respiratory distress EXAM: PORTABLE CHEST 1 VIEW COMPARISON:  05/14/2018 chest CT FINDINGS: Endotracheal tube tip just below the clavicular heads. The orogastric tube reaches the stomach. Hyperinflation. There is no edema, consolidation, effusion, or pneumothorax. Normal heart size. IMPRESSION: Hardware in expected position. COPD without acute superimposed finding. Electronically Signed   By: JMonte FantasiaM.D.   On: 08/31/2018 05:13   Vas [place]Lower Extremity Venous (dvt)  Result Date: 08/31/2018  Lower Venous Study Indications: Pulmonary embolism.  Performing Technologist: MAbram SanderRVS  Examination Guidelines: A  complete evaluation includes B-mode imaging, spectral Doppler, color Doppler, and power Doppler as needed of all accessible portions of each vessel. Bilateral testing is considered an integral part of a complete examination. Limited examinations for reoccurring indications may be performed as noted.  +---------+---------------+---------+-----------+----------+-------+  RIGHT     Compressibility Phasicity Spontaneity Properties Summary  +---------+---------------+---------+-----------+----------+-------+  CFV       Full            Yes       Yes                             +---------+---------------+---------+-----------+----------+-------+  SFJ       Full                                                      +---------+---------------+---------+-----------+----------+-------+  FV Prox   Full                                                      +---------+---------------+---------+-----------+----------+-------+  FV Mid    Full                                                      +---------+---------------+---------+-----------+----------+-------+  FV Distal Full                                                      +---------+---------------+---------+-----------+----------+-------+  PFV       Full                                                      +---------+---------------+---------+-----------+----------+-------+  POP       Full            Yes       Yes                             +---------+---------------+---------+-----------+----------+-------+  PTV       Full                                                      +---------+---------------+---------+-----------+----------+-------+  PERO      Full                                                      +---------+---------------+---------+-----------+----------+-------+   +---------+---------------+---------+-----------+----------+-------+  LEFT      Compressibility Phasicity Spontaneity Properties Summary   +---------+---------------+---------+-----------+----------+-------+  CFV       Full            Yes       Yes                             +---------+---------------+---------+-----------+----------+-------+  SFJ       Full                                                      +---------+---------------+---------+-----------+----------+-------+  FV Prox   Full                                                      +---------+---------------+---------+-----------+----------+-------+  FV Mid    Full                                                      +---------+---------------+---------+-----------+----------+-------+  FV Distal Full                                                      +---------+---------------+---------+-----------+----------+-------+  PFV       Full                                                      +---------+---------------+---------+-----------+----------+-------+  POP       Full            Yes       Yes                             +---------+---------------+---------+-----------+----------+-------+  PTV       Full                                                      +---------+---------------+---------+-----------+----------+-------+  PERO      Full                                                      +---------+---------------+---------+-----------+----------+-------+     Summary: Right: There is no evidence of deep vein thrombosis in the lower extremity. No cystic structure found in the popliteal fossa. Left: There is no evidence of deep vein thrombosis in the lower extremity. No cystic structure found in the popliteal fossa.  *See table(s) above for measurements and observations.    Preliminary     Assessment and Plan:   1. Abnormal echocardiogram with elevated troponin: Unclear if takotsubo cardiomyopathy versus ischemic.  Will consider ischemic work-up once patient is extubated.  2. Acute COPD exacerbation: Managed by PCCM  3. Incidental finding of left lower lobe PE: Managed by  PCCM.  4. Intracranial bleeding: Seen by neurosurgery.  Size of bleeding relatively small at this time.  5. Hypertension: BP borderline. Currently off of home BP med      For questions or updates, please contact [place] Please consult [email or web address] for contact info under     Signed, Almyra Deforest, [place]  08/31/2018 6:01 PM  I have seen and examined the patient along with Almyra Deforest, PA .  I have reviewed the chart, notes and new data.  I agree with PA's note.  Key new complaints: intubated, sedated, history exclusively from the chart Key examination changes: sedated; minimal wheezing; no edema/JVD or other signs of hypervolemia Key new findings / data: ECG is peculiar, with widespread nonspecific ST elevation that is more suggestive of myocarditis, rather than acute ischemic injury. There also appears to be PR segment depression, c/w pericarditis. Echo could represent takotsubo sd. (or acute myocarditis) or multivessel CAD. Suspect the small left lower lobe pulmonary embolism is not a driver of his illness. Likewise, hard to believe that his ECG changes and the marked myocardial dysfunction could be due to the very small intracranial hemorrhage. The mild elevation in troponin is disproportionately small for the extent of the ECG and echo abnormalities and could be seen with myocarditis of stress CMP. Note polycythemia, c/w longstanding and severe respiratory failure/COPD.   PLAN: Ultimately, we will probably not make an accurate diagnosis without coronary angiography. Ideally, this  will be done after he is extubated and we can make a shared decision re: invasive procedures. He does have evidence of elevated left heart filling pressures and will require diuresis, as allowed by BP and renal function.  Sanda Klein, MD, [place] [phone] 08/31/2018, 6:27 PM

## 2018-08-31 NOTE — ED Triage Notes (Signed)
PT arrived to er by ems with c/o sob, ems arrived to find pt sitting on front porch, hr 132, accessory muscle usage noted, lung sounds diminished, pt pale and clammy, upon arrival to er pt being bagged by ems, Dr Wyvonnia Dusky at bedside,

## 2018-08-31 NOTE — ED Notes (Signed)
Patient transported to CT 

## 2018-08-31 NOTE — ED Notes (Signed)
Date and time results received: 08/31/18 07:14 (use smartphrase ".now" to insert current time)  Test: lactic acid 4.8 Critical Value: lactic acid 4.8  Name of Provider Notified: Dr Wyvonnia Dusky  Orders Received? Or Actions Taken?: none

## 2018-08-31 NOTE — Progress Notes (Signed)
CRITICAL VALUE ALERT  Critical Value:  Lactic acid 4.9  Date & Time Notied:  08/31/2018 1200  Provider Notified: Dr. Nelda Marseille 08/31/2018 1205  Orders Received/Actions taken: MD notified

## 2018-08-31 NOTE — Progress Notes (Addendum)
CRITICAL VALUE ALERT  Critical Value:  Troponin 0.79 Lactic Acid 4.4  Date & Time Notied:  08/31/2018 1130am  Provider Notified: 08/31/2018 1134am  Orders Received/Actions taken: MD notified

## 2018-08-31 NOTE — ED Provider Notes (Addendum)
[facility] EMERGENCY DEPARTMENT Provider Note   CSN: [id number] Arrival date & time: 08/31/18  0423    History   Chief Complaint Chief Complaint  Patient presents with   Respiratory Distress    HPI Jeremiah [name] is a 73 y.o. male.     Level 5 caveat for respiratory distress.  Patient brought in by EMS with bag-valve-mask in progress.  He apparently told EMS for shortness of breath was found to be sitting on his front porch accessory muscle usage, heart rate 130s, hypoxic to the 70s, pale and clammy.  He was speaking 1-2 word phrases.  He denied chest pain.  He was not able to tolerate CPAP and became minimally responsive on arrival requiring assisted ventilations.  Known history of COPD and hypertension.  No known cough or coronavirus exposures or fever.  Additional history obtained from wife Jeremiah [name] by phone. Patient with worsening SOB over one week that became acutely worse tonight. His pulmonologist had recently arranged for home oxygen. No report of chest pain.  The history is provided by the EMS personnel and the patient. The history is limited by the condition of the patient.    Past Medical History:  Diagnosis Date   COPD (chronic obstructive pulmonary disease) ([place])    Depression    Hypertension    PTSD (post-traumatic stress disorder)    Sleep apnea     Patient Active Problem List   Diagnosis Date Noted   Chronic rhinitis 03/02/2018   Chronic cough 01/22/2017   Constipation 01/01/2016   Acute URI 03/13/2015   Medicare annual wellness visit, subsequent 12/28/2014   Essential hypertension 12/12/2014   Generalized anxiety disorder 11/02/2014   OSA (obstructive sleep apnea) 04/26/2014   COPD, frequent exacerbations ([place]) 04/25/2013    Past Surgical History:  Procedure Laterality Date   HAMMER TOE SURGERY  April 2017        Home Medications    Prior to Admission medications   Medication Sig Start Date End Date Taking? Authorizing  Provider  albuterol (PROVENTIL HFA;VENTOLIN HFA) 108 (90 Base) MCG/ACT inhaler Inhale 2 puffs into the lungs every 4 (four) hours as needed for wheezing or shortness of breath (((PLAN B))). 04/14/18   Fenton Foy, NP  ALPRAZolam Duanne Moron) 0.5 MG tablet TAKE 1 TABLET BY MOUTH TWICE A DAY AS NEEDED FOR ANXIETY 12/04/15   Tanda Rockers, MD  AMLODIPINE BESYLATE PO Take 2.5 mg by mouth daily.    [provider]  aspirin EC 81 MG tablet Take 81 mg by mouth daily.    [provider]  B-D 3CC LUER-LOK SYR 21GX1-1/2 21G X 1-1/2" 3 ML MISC USE NEEDLE/SYRINGE TO INJECT TESTOSTERONE INTO SKIN ONCE EVERY 2 WEEKS. 11/10/15   [provider]  benzonatate (TESSALON) 100 MG capsule Take 1 capsule (100 mg total) by mouth 3 (three) times daily. 08/13/18   Parrett, Fonnie Mu, NP  dextromethorphan-guaiFENesin (MUCINEX DM) 30-600 MG per 12 hr tablet Take 55m every 4 hours as needed with flutter valve for cough and congestion    [provider]  doxycycline (VIBRA-TABS) 100 MG tablet Take 1 tablet (100 mg total) by mouth 2 (two) times daily. 05/11/18   RBrand Males MD  erythromycin ophthalmic ointment Place into both eyes nightly. 10/30/16   [provider]  esomeprazole (NEXIUM) 40 MG capsule Take 40 mg by mouth daily before breakfast.  03/02/15   [provider]  famotidine (PEPCID) 20 MG tablet Take 20 mg by mouth at bedtime.  [provider]  fluticasone (FLONASE) 50 MCG/ACT nasal spray Place 2 sprays into both nostrils 2 (two) times daily.    [provider]  magnesium oxide (MAG-OX) 400 MG tablet Take 400 mg by mouth 2 (two) times daily. For leg cramps    [provider]  Multiple Vitamins-Minerals (CENTRUM SILVER 50+MEN) TABS Take 1 tablet by mouth daily.    [provider]  nystatin (MYCOSTATIN) 100000 UNIT/ML suspension Take 5 mLs (500,000 Units total) by mouth 4 (four) times daily. 07/30/18   Fenton Foy, NP   pravastatin (PRAVACHOL) 10 MG tablet Take 10 mg by mouth daily.    [provider]  predniSONE (DELTASONE) 10 MG tablet 40 mg x1 day, then 30 mg x1 day, then 20 mg x1 day, then 10 mg x1 day, and then 5 mg x1 day and stop 05/11/18   Brand Males, MD  Respiratory Therapy Supplies (FLUTTER) DEVI Use as directed 04/04/15   Tanda Rockers, MD  Respiratory Therapy Supplies (NEBULIZER COMPRESSOR) KIT Use as directed DX: J44.9 02/10/18   Brand Males, MD  traZODone (DESYREL) 50 MG tablet Take 1 tablet by mouth at bedtime as needed. 12/27/15   [provider]  Donnal Debar 100-62.5-25 MCG/INH AEPB TAKE 1 PUFF BY MOUTH EVERY DAY 07/13/18   Brand Males, MD    Family History Family History  Problem Relation Age of Onset   Asthma Maternal Grandmother    Heart disease Father    Dementia Mother     Social History Social History   Tobacco Use   Smoking status: Former Smoker    Packs/day: 1.00    Years: 30.00    Pack years: 30.00    Types: Cigarettes    Last attempt to quit: 04/16/1987    Years since quitting: 31.3   Smokeless tobacco: Never Used  Substance Use Topics   Alcohol use: Yes    Alcohol/week: 2.0 standard drinks    Types: 2 Glasses of wine per week   Drug use: No     Allergies   Patient has no known allergies.   Review of Systems Review of Systems  Unable to perform ROS: Acuity of condition     Physical Exam Updated Vital Signs BP (!) 172/124    Pulse (!) 148    Temp (!) 95.2 F (35.1 C) (Core (Comment))    Resp 20    Ht _0  (1.905 m)    Wt 76.2 kg    SpO2 96%    BMI 21.00 kg/m   Physical Exam Constitutional:      Appearance: He is ill-appearing and diaphoretic.     Comments: Obtunded, assisted ventilations, minimal respiratory effort  HENT:     Head: Normocephalic and atraumatic.     Mouth/Throat:     Mouth: Mucous membranes are moist.  Eyes:     Pupils: Pupils are equal, round, and reactive to light.  Neck:      Comments: JVD to angle of the jaw Cardiovascular:     Rate and Rhythm: Regular rhythm. Tachycardia present.     Pulses: Normal pulses.  Pulmonary:     Breath sounds: Wheezing present.     Comments: Diminished respiratory effort, very tight breath sounds with inspiratory and expiratory wheezing bilaterally Minimal air exchange Abdominal:     Tenderness: There is no abdominal tenderness. There is no guarding or rebound.  Musculoskeletal:     Right lower leg: No edema.     Left lower leg: No  edema.     Comments: No peripheral edema  Skin:    Comments: Diaphoretic and warm  Neurological:     Comments: Obtunded, does not follow commands, spontaneous movements of all extremities      ED Treatments / Results  Labs (all labs ordered are listed, but only abnormal results are displayed) Labs Reviewed  BLOOD GAS, ARTERIAL - Abnormal; Notable for the following components:      Result Value   pO2, Arterial 483 (*)    Acid-base deficit 4.4 (*)    All other components within normal limits  CBC WITH DIFFERENTIAL/PLATELET - Abnormal; Notable for the following components:   WBC 13.1 (*)    RBC 5.83 (*)    Hemoglobin 17.9 (*)    HCT 54.4 (*)    Neutro Abs 9.6 (*)    Eosinophils Absolute 0.6 (*)    Abs Immature Granulocytes 0.08 (*)    All other components within normal limits  COMPREHENSIVE METABOLIC PANEL - Abnormal; Notable for the following components:   Glucose, Bld 200 (*)    Total Protein 8.3 (*)    GFR calc non Af Amer 60 (*)    All other components within normal limits  TROPONIN I - Abnormal; Notable for the following components:   Troponin I 0.04 (*)    All other components within normal limits  BRAIN NATRIURETIC PEPTIDE - Abnormal; Notable for the following components:   B Natriuretic Peptide 285.0 (*)    All other components within normal limits  LACTIC ACID, PLASMA - Abnormal; Notable for the following components:   Lactic Acid, Venous 4.8 (*)    All other components  within normal limits  URINALYSIS, ROUTINE W REFLEX MICROSCOPIC - Abnormal; Notable for the following components:   APPearance CLOUDY (*)    Glucose, UA 150 (*)    Ketones, ur 5 (*)    Protein, ur 100 (*)    WBC, UA >50 (*)    All other components within normal limits  SARS CORONAVIRUS 2 (HOSPITAL ORDER, [facility])  CULTURE, BLOOD (ROUTINE X 2)  CULTURE, BLOOD (ROUTINE X 2)  LACTIC ACID, PLASMA  TRIGLYCERIDES    EKG EKG Interpretation  Date/Time:  Monday Aug 31 2018 04:37:20 EDT Ventricular Rate:  145 PR Interval:    QRS Duration: 82 QT Interval:  274 QTC Calculation: 426 R Axis:   77 Text Interpretation:  Sinus tachycardia Ventricular premature complex Aberrant complex Probable left atrial enlargement Inferolateral infarct, acute (LCx) diffuse J point elevation, PR depression Diffuse ST elevation likely rate related. d/w Dr. Kalman Shan. agrees doesn't meet STEMI criteria Confirmed by Ezequiel Essex [phone]) on 08/31/2018 5:00:21 AM   Radiology Ct Head Wo Contrast  Result Date: 08/31/2018 CLINICAL DATA:  Altered level of consciousness, unexplained EXAM: CT HEAD WITHOUT CONTRAST TECHNIQUE: Contiguous axial images were obtained from the base of the skull through the vertex without intravenous contrast. COMPARISON:  None similar FINDINGS: Brain: 5 mm ovoid high-density focus in the subcortical high left frontal region. Background of confluent low-density in the bilateral cerebral white matter. Linear high-density within a left parietal sulcus which has the appearance of a vessel on sagittal reformats. No evidence of acute gray matter infarct. No masslike finding or hydrocephalus. Vascular: No hyperdense vessel noted. Skull: Negative Sinuses/Orbits: The sinuses are essentially clear.  Negative orbits. Critical Value/emergent results were called by telephone at the time of interpretation on 08/31/2018 at 7:06 am to Dr. Ezequiel Essex , who verbally acknowledged these  results. IMPRESSION: 1. 5 mm acute subcortical hemorrhage in the left frontal lobe. Extensive chronic white matter disease, question amyloid angiopathy given this location. 2. Attention to a minimally high-dense left parietal sulcus on follow-up. Electronically Signed   By: Monte Fantasia M.D.   On: 08/31/2018 07:06   Ct Angio Chest Pe W And/or Wo Contrast  Result Date: 08/31/2018 CLINICAL DATA:  Altered level of consciousness.  Elevated D-dimer EXAM: CT ANGIOGRAPHY CHEST WITH CONTRAST TECHNIQUE: Multidetector CT imaging of the chest was performed using the standard protocol during bolus administration of intravenous contrast. Multiplanar CT image reconstructions and MIPs were obtained to evaluate the vascular anatomy. CONTRAST:  120m OMNIPAQUE IOHEXOL 350 MG/ML SOLN COMPARISON:  02/20/2016 FINDINGS: Cardiovascular: Normal heart size. No pericardial effusion. Coronary calcification is present. There is no opacification of the aorta due to contrast timing. A single subsegmental pulmonary embolism is seen in the left lower lobe. Proximally the filling defect is eccentric, but there does appear to be branching into the downstream vessel and this is presumably acute in this setting. No large or second pulmonary embolism is seen. There is motion artifact at the bases. Pulmonary arteries are not enlarged and there is no right ventricular dilatation in this patient with elevated troponin Mediastinum/Nodes: Negative for adenopathy. Lungs/Pleura: Centrilobular emphysema. Patchy airway debris without collapse. There is no edema, consolidation, effusion, or pneumothorax. Endotracheal tube in good position. Upper Abdomen: Orogastric tube in good position. Musculoskeletal: No acute or aggressive finding Critical Value/emergent results were called by telephone at the time of interpretation on 08/31/2018 at 7:04 am to Dr. SEzequiel Essex, who verbally acknowledged these results. Review of the MIP images confirms the above  findings. IMPRESSION: 1. Single subsegmental pulmonary embolism to the left lower lobe. 2. Coronary atherosclerosis. 3. Emphysema. Electronically Signed   By: JMonte FantasiaM.D.   On: 08/31/2018 07:11   Dg Chest Portable 1 View  Result Date: 08/31/2018 CLINICAL DATA:  Endotracheal tube.  Respiratory distress EXAM: PORTABLE CHEST 1 VIEW COMPARISON:  05/14/2018 chest CT FINDINGS: Endotracheal tube tip just below the clavicular heads. The orogastric tube reaches the stomach. Hyperinflation. There is no edema, consolidation, effusion, or pneumothorax. Normal heart size. IMPRESSION: Hardware in expected position. COPD without acute superimposed finding. Electronically Signed   By: JMonte FantasiaM.D.   On: 08/31/2018 05:13    Procedures Procedure Name: Intubation Date/Time: 08/31/2018 5:03 AM Performed by: REzequiel Essex MD Pre-anesthesia Checklist: Patient identified, Patient being monitored, Emergency Drugs available, Timeout performed and Suction available Oxygen Delivery Method: Non-rebreather mask Preoxygenation: Pre-oxygenation with 100% oxygen Induction Type: Rapid sequence and IV induction Ventilation: Mask ventilation without difficulty Laryngoscope Size: Glidescope, 3 and Mac Grade View: Grade II Tube size: 7.5 mm Number of attempts: 2 Airway Equipment and Method: Video-laryngoscopy and Bougie stylet Placement Confirmation: ETT inserted through vocal cords under direct vision,  CO2 detector and Breath sounds checked- equal and bilateral Secured at: 24 cm Tube secured with: ETT holder Dental Injury: Bloody posterior oropharynx  Difficulty Due To: Difficulty was anticipated and Difficult Airway- due to anterior larynx Future Recommendations: Recommend- induction with short-acting agent, and alternative techniques readily available     Ultrasound ED Peripheral IV (Provider) Date/Time: 08/31/2018 5:44 AM Performed by: REzequiel Essex MD Authorized by: REzequiel Essex MD    Procedure details:    Indications: poor IV access     Skin Prep: isopropyl alcohol     Location: L EJ.   Angiocath:  18 G   Bedside Ultrasound Guided:  No     Images: not archived     Patient tolerated procedure without complications: Yes     Dressing applied: Yes   Ultrasound ED Peripheral IV (Provider) Date/Time: 08/31/2018 5:46 AM Performed by: Ezequiel Essex, MD Authorized by: Ezequiel Essex, MD   Procedure details:    Indications: poor IV access     Skin Prep: isopropyl alcohol     Location: R EJ.   Angiocath:  8 G   Patient tolerated procedure without complications: Yes     Dressing applied: Yes     (including critical care time)  Medications Ordered in ED Medications  etomidate (AMIDATE) injection (20 mg Intravenous Given 08/31/18 0426)  rocuronium (ZEMURON) injection (100 mg Intravenous Given 08/31/18 0426)  magnesium sulfate IVPB 2 g 50 mL (2 g Intravenous New Bag/Given 08/31/18 0445)  fentaNYL (SUBLIMAZE) injection 50 mcg (has no administration in time range)  fentaNYL (SUBLIMAZE) injection 50 mcg (has no administration in time range)  propofol (DIPRIVAN) 1000 MG/100ML infusion (25 mcg/kg/min  76.2 kg Intravenous Rate/Dose Change 08/31/18 0454)  nitroGLYCERIN 50 mg in dextrose 5 % 250 mL (0.2 mg/mL) infusion (has no administration in time range)  methylPREDNISolone sodium succinate (SOLU-MEDROL) 125 mg/2 mL injection 125 mg (125 mg Intravenous Given 08/31/18 0455)  albuterol (PROVENTIL,VENTOLIN) solution continuous neb (10 mg/hr Nebulization Given 08/31/18 0458)  ipratropium (ATROVENT) nebulizer solution 0.5 mg (0.5 mg Nebulization Given 08/31/18 0458)  furosemide (LASIX) injection 60 mg (60 mg Intravenous Given 08/31/18 0455)     Initial Impression / Assessment and Plan / ED Course  I have reviewed the triage vital signs and the nursing notes.  Pertinent labs & imaging results that were available during my care of the patient were reviewed by me and considered in  my medical decision making (see chart for details).       Patient presents in extremis with assisted ventilations.  History of COPD.  He arrives hypoxic, hypertensive, diaphoretic, minimally responsive.  Preparations made for intubation.  EKG shows diffuse J-point ST elevation and PR depression.  No reports of chest pain.  Patient intubated as above. Started on IV steroids, magnesium and bronchodilators.  IV Lasix given empirically.  EKG discussed with Dr. Kalman Shan and with Dr. Burt Knack cardiology.  They agree does not meet STEMI criteria but is concerning for diffuse J-point elevation.  There is no reciprocal change.  Dr. Burt Knack agrees with starting IV heparin and trending troponins.  Discussed with patient's wife Jeremiah [name].  She gave permission to speak with her brother Dr. Marcelino Duster who is a gastroenterologist in [place].  [phone].  CXR without infiltrate. No significant CO2 retention on ABG. FiO2 titrated down. Lactate normal. No fever. COVID19 negative.   CTPE obtained with diffuse EKG changes and positive troponin.  This is remarkable for small subsegmental PE. D/w Dr. Pascal Lux of radiology. Patient also with 5 mm subcortical hemorrhage.  PE doesn't seem large enough to cause extent of respiratory distress and EKG changes. With subcortical hemorrhage, will hold heparin at this time.   Second lactate 4.8. fluid boluses given. Blood and urine cultures obtained. Empiric rocephin and zithromax started. Admission to [facility] ICU d/w Dr. Jimmy Footman. Family updated.   Worthy Boschert was evaluated in Emergency Department on 08/31/2018 for the symptoms described in the history of present illness. He was evaluated in the context of the global COVID-19 pandemic, which necessitated consideration that the patient might be at risk for infection with the SARS-CoV-2 virus that causes COVID-19. Institutional protocols and algorithms that  pertain to the evaluation of patients at risk for COVID-19 are in  a state of rapid change based on information released by regulatory bodies including the CDC and federal and state organizations. These policies and algorithms were followed during the patient's care in the ED.  CRITICAL CARE Performed by: Ezequiel Essex Total critical care time: 90 minutes Critical care time was exclusive of separately billable procedures and treating other patients. Critical care was necessary to treat or prevent imminent or life-threatening deterioration. Critical care was time spent personally by me on the following activities: development of treatment plan with patient and/or surrogate as well as nursing, discussions with consultants, evaluation of patient's response to treatment, examination of patient, obtaining history from patient or surrogate, ordering and performing treatments and interventions, ordering and review of laboratory studies, ordering and review of radiographic studies, pulse oximetry and re-evaluation of patient's condition.    Final Clinical Impressions(s) / ED Diagnoses   Final diagnoses:  Acute respiratory failure with hypoxia [facility])    ED Discharge Orders    None       Ezequiel Essex, MD 08/31/18 [id number]    Ezequiel Essex, MD 08/31/18 [phone]

## 2018-08-31 NOTE — Consult Note (Addendum)
Neurology Consultation  Reason for Consult: [place] Referring Physician: Dr. Nelda Marseille  CC: Altered mental status, respiratory failure, ICH  History is obtained from: Chart review  HPI: Purl Claytor is a 73 y.o. male COPD, depression, sleep apnea, PTSD, hypertension, presented to [facility] with increasing shortness of breath that was thought to be secondary to acute exacerbation of his COPD.  He required intubation after failing noninvasive ventilation.  He was hypoxic into the 70s and was speaking 1-2 word phrases.  Upon speaking with his wife over the phone, the EDP documented that he was being worked up for home oxygen but he became acutely worse and was brought to [facility], ED. In the emergency room at [facility], noncontrast head CT and a CT with PE protocol was obtained after his EKG was concerning for diffuse J-point ST elevation and PR depression. CT chest showed a very small PE. Noncontrast CT of the head showed a 5 mm subcortical hemorrhage. His troponins were trended and most current troponin is 1.9, elevated from 0.7. PCCM consulted neurology due to the fact that the patient might need heparin for his STEMI and they would like input on starting heparin in the presence of ICH. Patient is currently unable to provide any history.  He was given a bolus of fentanyl a few minutes prior to the examination and was on 35 mics per KG per per minute of propofol which was held for the exam.   LKW: At least prior to presentation at [facility] which was 4:36 AM on 08/31/2018 tpa given?: no, due to  [place] Premorbid modified Rankin scale (mRS): No family at bedside, unable to obtain. ICH score 1 (for GCS)  ROS: ROS was performed and is negative except as noted in the HPI.   Past Medical History:  Diagnosis Date  . COPD (chronic obstructive pulmonary disease) ([place])   . Depression   . Hypertension   . PTSD (post-traumatic stress disorder)   . Sleep apnea     Family  History  Problem Relation Age of Onset  . Asthma Maternal Grandmother   . Heart disease Father   . Dementia Mother     Social History:   reports that he quit smoking about 31 years ago. His smoking use included cigarettes. He has a 30.00 pack-year smoking history. He has never used smokeless tobacco. He reports current alcohol use of about 2.0 standard drinks of alcohol per week. He reports that he does not use drugs.  Medications  Current Facility-Administered Medications:  .  0.9 %  sodium chloride infusion, , Intravenous, Continuous, Desai, Rahul P, PA-C .  albuterol (PROVENTIL) (2.5 MG/3ML) 0.083% nebulizer solution 2.5 mg, 2.5 mg, Nebulization, Q3H PRN, Shearon Stalls, Rahul P, PA-C .  [START ON 09/01/2018] cefTRIAXone (ROCEPHIN) 1 g in sodium chloride 0.9 % 100 mL IVPB, 1 g, Intravenous, Q24H, Desai, Rahul P, PA-C .  chlorhexidine gluconate (MEDLINE KIT) (PERIDEX) 0.12 % solution 15 mL, 15 mL, Mouth Rinse, BID, Desai, Rahul P, PA-C, 15 mL at 08/31/18 1034 .  Chlorhexidine Gluconate Cloth 2 % PADS 6 each, 6 each, Topical, Daily, Desai, Rahul P, PA-C .  fentaNYL (SUBLIMAZE) injection 50 mcg, 50 mcg, Intravenous, Q2H PRN, Rancour, Stephen, MD, 50 mcg at 08/31/18 1720 .  insulin aspart (novoLOG) injection 0-15 Units, 0-15 Units, Subcutaneous, Q4H, Desai, Rahul P, PA-C, 2 Units at 08/31/18 1607 .  MEDLINE mouth rinse, 15 mL, Mouth Rinse, 10 times per day, Desai, Rahul P, PA-C, 15 mL at  08/31/18 1608 .  pantoprazole (PROTONIX) injection 40 mg, 40 mg, Intravenous, QHS, Desai, Rahul P, PA-C .  pravastatin (PRAVACHOL) tablet 10 mg, 10 mg, Per Tube, q1800, Desai, Rahul P, PA-C, 10 mg at 08/31/18 1730 .  propofol (DIPRIVAN) 1000 MG/100ML infusion, 0-50 mcg/kg/min, Intravenous, Continuous, Rancour, Stephen, MD, Last Rate: 9.14 mL/hr at 08/31/18 1036, 20 mcg/kg/min at 08/31/18 1036   Exam: Current vital signs: BP 105/84   Pulse 69   Temp 98.4 F (36.9 C) (Bladder)   Resp 20   Ht '6\' 3"'$  (1.905 m)   Wt  76.2 kg   SpO2 100%   BMI 21.00 kg/m  Vital signs in last 24 hours: Temp:  [95.2 F (35.1 C)-98.6 F (37 C)] 98.4 F (36.9 C) (05/18 1600) Pulse Rate:  [69-148] 69 (05/18 1603) Resp:  [12-32] 20 (05/18 1600) BP: (69-180)/(46-137) 105/84 (05/18 1603) SpO2:  [96 %-100 %] 100 % (05/18 1603) FiO2 (%):  [30 %-100 %] 40 % (05/18 1603) Weight:  [76.2 kg] 76.2 kg (05/18 0439) General: Sedated intubated HEENT poor normocephalic atraumatic Chest: Decreased breath sounds bilaterally Abdomen: Nondistended nontender Neurological exam Patient is sedated on propofol which was held for the exam. He is intubated He is breathing over the ventilator, requiring sedation for preventing vent dyssynchrony. He is not following any commands He grimaces to sternal rub and attempts to localize. Cranial nerves: Pupils are equal round reactive light, corneal reflexes are present, is breathing over the ventilator, cough and gag are present. Motor exam: No spontaneous movements but on noxious stimulation there is brisk localization to central noxious stimulation and some withdrawal in all 4 extremities to noxious simulation. Sensory exam: As above Gait and coordination cannot be tested.  NIHSS - 18 UN   Labs I have reviewed labs in epic and the results pertinent to this consultation are:  CBC    Component Value Date/Time   WBC 13.1 (H) 08/31/2018 0458   RBC 5.83 (H) 08/31/2018 0458   HGB 17.9 (H) 08/31/2018 0458   HCT 54.4 (H) 08/31/2018 0458   PLT 262 08/31/2018 0458   MCV 93.3 08/31/2018 0458   MCH 30.7 08/31/2018 0458   MCHC 32.9 08/31/2018 0458   RDW 12.6 08/31/2018 0458   LYMPHSABS 1.9 08/31/2018 0458   MONOABS 0.8 08/31/2018 0458   EOSABS 0.6 (H) 08/31/2018 0458   BASOSABS 0.1 08/31/2018 0458    CMP     Component Value Date/Time   NA 138 08/31/2018 0458   K 3.8 08/31/2018 0458   CL 103 08/31/2018 0458   CO2 22 08/31/2018 0458   GLUCOSE 200 (H) 08/31/2018 0458   BUN 17 08/31/2018  0458   CREATININE 1.22 08/31/2018 0458   CALCIUM 10.0 08/31/2018 0458   PROT 8.3 (H) 08/31/2018 0458   ALBUMIN 4.6 08/31/2018 0458   AST 34 08/31/2018 0458   ALT 14 08/31/2018 0458   ALKPHOS 85 08/31/2018 0458   BILITOT 1.0 08/31/2018 0458   GFRNONAA 60 (L) 08/31/2018 0458   GFRAA >60 08/31/2018 0458   Imaging I have reviewed the images obtained: CT-scan of the brain -a 5 mm acute subcortical hemorrhage in the left frontal lobe.  Extensive chronic white matter disease. Due to the location of the bleed, possible etiologies to consider should include amyloid. There is a minimal high dense left parietal sulcus on CT head which should be paid attention to on follow-up scans.   Assessment: 73 year old man with hypertension, COPD, depression, sleep apnea and PTSD presenting to [facility] with worsening shortness of breath, intubated for failing noninvasive ventilation, on head CT noted to have a 5 mm left frontal ICH, also with possible STEMI. Patient continues to be encephalopathic-probably has some amount of hypoxic ischemic encephalopathy. Specific questions for which neurological consultation was obtained was regarding use of heparin for the STEMI. Given the size of the stroke, the benefits of heparin for a STEMI would outweigh the risks at this time if heparin is used with stroke protocol-no bolus, and low-level. Repeat imaging in 12 hours would also help ensure that we follow-up on this bleed for any possible expansion.  Impression: Small left frontal subcortical ICH-?  Amyloid versus hypertension. Being worked up for STEMI  Recommendations: From an [place] perspective, if there were no STEMI, antiplatelets and anticoagulants should be avoided until the resolution of the bleed but given the acuity of the STEMI, if deemed necessary by cardiology, heparin stroke protocol should be used as above. He should get an MRI of the brain to evaluate for any underlying chronic  microhemorrhages or changes suggestive of cerebral amyloid angiopathy. Keep his systolic blood pressures below 140-use labetalol as needed and also can use Cleviprex drip. Repeat CT head at 6 AM Stroke neurology service will continue to follow with you. Please call with questions  -- Amie Portland, MD [facility] Pager: [phone] If 7pm to 7am, please call on call as listed on AMION.   CRITICAL CARE ATTESTATION Performed by: Amie Portland, MD Total critical care time: 30 minutes Critical care time was exclusive of separately billable procedures and treating other patients and/or supervising APPs/Residents/Students Critical care was necessary to treat or prevent imminent or life-threatening deterioration due to [place], HIE  This patient is critically ill and at significant risk for neurological worsening and/or death and care requires constant monitoring. Critical care was time spent personally by me on the following activities: development of treatment plan with patient and/or surrogate as well as nursing, discussions with consultants, evaluation of patient's response to treatment, examination of patient, obtaining history from patient or surrogate, ordering and performing treatments and interventions, ordering and review of laboratory studies, ordering and review of radiographic studies, pulse oximetry, re-evaluation of patient's condition, participation in multidisciplinary rounds and medical decision making of high complexity in the care of this patient.

## 2018-08-31 NOTE — ED Notes (Signed)
Date and time results received: 08/31/18 5:34 AM  (use smartphrase ".now" to insert current time)  Test: trop  Critical Value: 0.04  Name of Provider Notified: rancour   Orders Received? Or Actions Taken?: .

## 2018-09-01 ENCOUNTER — Inpatient Hospital Stay (HOSPITAL_COMMUNITY): Payer: Medicare Other

## 2018-09-01 ENCOUNTER — Ambulatory Visit: Payer: Medicare Other | Admitting: Adult Health

## 2018-09-01 DIAGNOSIS — J9589 Other postprocedural complications and disorders of respiratory system, not elsewhere classified: Secondary | ICD-10-CM | POA: Diagnosis not present

## 2018-09-01 DIAGNOSIS — N179 Acute kidney failure, unspecified: Secondary | ICD-10-CM

## 2018-09-01 DIAGNOSIS — I5021 Acute systolic (congestive) heart failure: Secondary | ICD-10-CM

## 2018-09-01 DIAGNOSIS — I429 Cardiomyopathy, unspecified: Secondary | ICD-10-CM

## 2018-09-01 DIAGNOSIS — I611 Nontraumatic intracerebral hemorrhage in hemisphere, cortical: Secondary | ICD-10-CM

## 2018-09-01 LAB — POCT I-STAT 7, (LYTES, BLD GAS, ICA,H+H)
Acid-base deficit: 2 mmol/L (ref 0.0–2.0)
Acid-base deficit: 1 mmol/L (ref 0.0–2.0)
Bicarbonate: 20 mmol/L (ref 20.0–28.0)
Bicarbonate: 24.2 mmol/L (ref 20.0–28.0)
Calcium, Ion: 1.29 mmol/L (ref 1.15–1.40)
Calcium, Ion: 1.28 mmol/L (ref 1.15–1.40)
HCT: 47 % (ref 39.0–52.0)
HCT: 42 % (ref 39.0–52.0)
Hemoglobin: 16 g/dL (ref 13.0–17.0)
Hemoglobin: 14.3 g/dL (ref 13.0–17.0)
O2 Saturation: 95 %
O2 Saturation: 100 %
Patient temperature: 36.9
Patient temperature: 98.6
Potassium: 4 mmol/L (ref 3.5–5.1)
Potassium: 4 mmol/L (ref 3.5–5.1)
Sodium: 139 mmol/L (ref 135–145)
Sodium: 142 mmol/L (ref 135–145)
TCO2: 21 mmol/L — ABNORMAL LOW (ref 22–32)
TCO2: 25 mmol/L (ref 22–32)
pCO2 arterial: 27.7 mmHg — ABNORMAL LOW (ref 32.0–48.0)
pCO2 arterial: 42.5 mmHg (ref 32.0–48.0)
pH, Arterial: 7.466 — ABNORMAL HIGH (ref 7.350–7.450)
pH, Arterial: 7.364 (ref 7.350–7.450)
pO2, Arterial: 67 mmHg — ABNORMAL LOW (ref 83.0–108.0)
pO2, Arterial: 499 mmHg — ABNORMAL HIGH (ref 83.0–108.0)

## 2018-09-01 LAB — CBC
HCT: 50 % (ref 39.0–52.0)
Hemoglobin: 16.6 g/dL (ref 13.0–17.0)
MCH: 30.9 pg (ref 26.0–34.0)
MCHC: 33.2 g/dL (ref 30.0–36.0)
MCV: 92.9 fL (ref 80.0–100.0)
Platelets: 201 10*3/uL (ref 150–400)
RBC: 5.38 MIL/uL (ref 4.22–5.81)
RDW: 12.6 % (ref 11.5–15.5)
WBC: 12.3 10*3/uL — ABNORMAL HIGH (ref 4.0–10.5)
nRBC: 0 % (ref 0.0–0.2)

## 2018-09-01 LAB — PHOSPHORUS: Phosphorus: 3 mg/dL (ref 2.5–4.6)

## 2018-09-01 LAB — GLUCOSE, CAPILLARY
Glucose-Capillary: 105 mg/dL — ABNORMAL HIGH (ref 70–99)
Glucose-Capillary: 116 mg/dL — ABNORMAL HIGH (ref 70–99)
Glucose-Capillary: 52 mg/dL — ABNORMAL LOW (ref 70–99)
Glucose-Capillary: 71 mg/dL (ref 70–99)
Glucose-Capillary: 73 mg/dL (ref 70–99)
Glucose-Capillary: 76 mg/dL (ref 70–99)
Glucose-Capillary: 80 mg/dL (ref 70–99)
Glucose-Capillary: 92 mg/dL (ref 70–99)

## 2018-09-01 LAB — BASIC METABOLIC PANEL
Anion gap: 14 (ref 5–15)
BUN: 17 mg/dL (ref 8–23)
CO2: 21 mmol/L — ABNORMAL LOW (ref 22–32)
Calcium: 9.4 mg/dL (ref 8.9–10.3)
Chloride: 104 mmol/L (ref 98–111)
Creatinine, Ser: 1.39 mg/dL — ABNORMAL HIGH (ref 0.61–1.24)
GFR calc Af Amer: 59 mL/min — ABNORMAL LOW (ref >60)
GFR calc non Af Amer: 51 mL/min — ABNORMAL LOW (ref >60)
Glucose, Bld: 124 mg/dL — ABNORMAL HIGH (ref 70–99)
Potassium: 4.6 mmol/L (ref 3.5–5.1)
Sodium: 139 mmol/L (ref 135–145)

## 2018-09-01 LAB — BLOOD CULTURE ID PANEL (REFLEXED)

## 2018-09-01 LAB — HIV ANTIBODY (ROUTINE TESTING W REFLEX): HIV Screen 4th Generation wRfx: NONREACTIVE

## 2018-09-01 LAB — TROPONIN I: Troponin I: 1.29 ng/mL (ref <0.03)

## 2018-09-01 LAB — MAGNESIUM: Magnesium: 2.2 mg/dL (ref 1.7–2.4)

## 2018-09-01 MED ORDER — MORPHINE SULFATE (PF) 2 MG/ML IV SOLN
1.0000 mg | INTRAVENOUS | Status: DC | PRN
  Administered 2018-09-01 – 2018-09-02 (×4): 1 mg via INTRAVENOUS
  Filled 2018-09-01 (×4): qty 1

## 2018-09-01 MED ORDER — FENTANYL CITRATE (PF) 100 MCG/2ML IJ SOLN
INTRAMUSCULAR | Status: AC
  Filled 2018-09-01: qty 2

## 2018-09-01 MED ORDER — DEXAMETHASONE SODIUM PHOSPHATE 10 MG/ML IJ SOLN
6.0000 mg | Freq: Once | INTRAMUSCULAR | Status: DC
  Filled 2018-09-01: qty 0.6

## 2018-09-01 MED ORDER — DEXMEDETOMIDINE HCL IN NACL 400 MCG/100ML IV SOLN
0.4000 ug/kg/h | INTRAVENOUS | Status: DC
  Administered 2018-09-01: 0.4 ug/kg/h via INTRAVENOUS
  Administered 2018-09-01: 19:00:00 1.2 ug/kg/h via INTRAVENOUS
  Filled 2018-09-01 (×2): qty 100

## 2018-09-01 MED ORDER — ROCURONIUM BROMIDE 50 MG/5ML IV SOLN
25.0000 mg | Freq: Once | INTRAVENOUS | Status: AC
  Administered 2018-09-01: 25 mg via INTRAVENOUS

## 2018-09-01 MED ORDER — DEXAMETHASONE SODIUM PHOSPHATE 10 MG/ML IJ SOLN
6.0000 mg | Freq: Four times a day (QID) | INTRAMUSCULAR | Status: AC
  Administered 2018-09-01 – 2018-09-02 (×4): 6 mg via INTRAVENOUS
  Filled 2018-09-01 (×4): qty 0.6

## 2018-09-01 MED ORDER — ORAL CARE MOUTH RINSE: 15.0000 mL | Freq: Two times a day (BID) | OROMUCOSAL | Status: DC

## 2018-09-01 MED ORDER — ISOSORB DINITRATE-HYDRALAZINE 20-37.5 MG PO TABS
0.5000 | ORAL_TABLET | Freq: Three times a day (TID) | ORAL | Status: DC
  Administered 2018-09-02 – 2018-09-03 (×3): 0.5 via ORAL
  Filled 2018-09-01 (×8): qty 0.5

## 2018-09-01 MED ORDER — MIDAZOLAM HCL 2 MG/2ML IJ SOLN
INTRAMUSCULAR | Status: AC
  Filled 2018-09-01: qty 2

## 2018-09-01 MED ORDER — DEXTROSE 50 % IV SOLN
INTRAVENOUS | Status: AC
  Filled 2018-09-01: qty 50

## 2018-09-01 MED ORDER — ETOMIDATE 2 MG/ML IV SOLN
20.0000 mg | Freq: Once | INTRAVENOUS | Status: AC
  Administered 2018-09-01: 20 mg via INTRAVENOUS

## 2018-09-01 MED ORDER — HALOPERIDOL LACTATE 5 MG/ML IJ SOLN
2.0000 mg | INTRAMUSCULAR | Status: DC | PRN
  Administered 2018-09-01 – 2018-09-03 (×3): 2 mg via INTRAVENOUS
  Filled 2018-09-01 (×2): qty 1

## 2018-09-01 MED ORDER — DEXTROSE 50 % IV SOLN
INTRAVENOUS | Status: AC
  Administered 2018-09-01: 12:00:00 50 mL
  Filled 2018-09-01: qty 50

## 2018-09-01 MED ORDER — RACEPINEPHRINE HCL 2.25 % IN NEBU
INHALATION_SOLUTION | RESPIRATORY_TRACT | Status: AC
  Administered 2018-09-01: 20:00:00 0.5 mL
  Filled 2018-09-01: qty 0.5

## 2018-09-01 MED ORDER — DEXTROSE 50 % IV SOLN
INTRAVENOUS | Status: AC
  Administered 2018-09-01: 08:00:00 50 mL
  Filled 2018-09-01: qty 50

## 2018-09-01 MED ORDER — CHLORHEXIDINE GLUCONATE 0.12% ORAL RINSE (MEDLINE KIT)
15.0000 mL | Freq: Two times a day (BID) | OROMUCOSAL | Status: DC
  Administered 2018-09-01 – 2018-09-02 (×2): 15 mL via OROMUCOSAL

## 2018-09-01 MED ORDER — DEXTROSE-NACL 5-0.9 % IV SOLN
INTRAVENOUS | Status: DC
  Administered 2018-09-01 – 2018-09-02 (×2): via INTRAVENOUS

## 2018-09-01 MED ORDER — IPRATROPIUM-ALBUTEROL 0.5-2.5 (3) MG/3ML IN SOLN
3.0000 mL | Freq: Four times a day (QID) | RESPIRATORY_TRACT | Status: DC
  Administered 2018-09-01 – 2018-09-04 (×10): 3 mL via RESPIRATORY_TRACT
  Filled 2018-09-01 (×8): qty 3

## 2018-09-01 MED ORDER — ORAL CARE MOUTH RINSE
15.0000 mL | OROMUCOSAL | Status: DC
  Administered 2018-09-01 – 2018-09-08 (×66): 15 mL via OROMUCOSAL

## 2018-09-01 MED ORDER — PROPOFOL 1000 MG/100ML IV EMUL
0.0000 ug/kg/min | INTRAVENOUS | Status: DC
  Administered 2018-09-01: 5 ug/kg/min via INTRAVENOUS
  Administered 2018-09-02 (×2): 50 ug/kg/min via INTRAVENOUS
  Administered 2018-09-03: 30 ug/kg/min via INTRAVENOUS
  Administered 2018-09-03: 40 ug/kg/min via INTRAVENOUS
  Administered 2018-09-03: 20 ug/kg/min via INTRAVENOUS
  Administered 2018-09-03: 50 ug/kg/min via INTRAVENOUS
  Administered 2018-09-04: 5 ug/kg/min via INTRAVENOUS
  Administered 2018-09-04: 45 ug/kg/min via INTRAVENOUS
  Administered 2018-09-04: 30 ug/kg/min via INTRAVENOUS
  Administered 2018-09-04: 40 ug/kg/min via INTRAVENOUS
  Administered 2018-09-05: 20 ug/kg/min via INTRAVENOUS
  Administered 2018-09-05: 35 ug/kg/min via INTRAVENOUS
  Administered 2018-09-05: 25 ug/kg/min via INTRAVENOUS
  Administered 2018-09-06: 30 ug/kg/min via INTRAVENOUS
  Filled 2018-09-01 (×17): qty 100

## 2018-09-01 NOTE — Progress Notes (Signed)
PCCM Interval Progress Note  Sedation off.  Pt agitated, not following commands but moving all over bed, strong cough, vitals stable.  Extubated and tolerating well so far.  Might require restraints temporarily until he is re-oriented, etc (of note, has hx of underlying dementia).   Montey Hora, [place] [facility] Pager: [phone].  If no answer, [phone] 09/01/2018, 11:16 AM

## 2018-09-01 NOTE — Progress Notes (Signed)
Progress Note  Patient Name: Jeremiah [name] Date of Encounter: 09/01/2018  Primary Cardiologist: No primary care provider on file.   Subjective   Just extubated, mildly agitated and clearly disoriented, unable to obtain any review of systems. On oxygen 3 L by nasal cannula with oxygen saturation around 95%. He is wheezing more than he was yesterday.  Inpatient Medications    Scheduled Meds:   stroke: mapping our early stages of recovery book   Does not apply Once   chlorhexidine gluconate (MEDLINE KIT)  15 mL Mouth Rinse BID   Chlorhexidine Gluconate Cloth  6 each Topical Daily   insulin aspart  0-15 Units Subcutaneous Q4H   labetalol  20 mg Intravenous Once   mouth rinse  15 mL Mouth Rinse 10 times per day   pantoprazole (PROTONIX) IV  40 mg Intravenous QHS   pravastatin  10 mg Per Tube q1800   senna-docusate  1 tablet Oral BID   Continuous Infusions:  sodium chloride 10 mL/hr at 09/01/18 0945   cefTRIAXone (ROCEPHIN)  IV 1 g (09/01/18 0857)   clevidipine Stopped (08/31/18 1837)   dextrose 5 % and 0.9% NaCl 40 mL/hr at 09/01/18 1201   PRN Meds: acetaminophen **OR** acetaminophen (TYLENOL) oral liquid 160 mg/5 mL **OR** acetaminophen, albuterol   Vital Signs    Vitals:   09/01/18 0741 09/01/18 0806 09/01/18 1126 09/01/18 1200  BP:  97/79  (!) 166/115  Pulse:  66  (!) 120  Resp:    (!) 31  Temp: (!) 97.2 F (36.2 C)  98.6 F (37 C) (!) 97.3 F (36.3 C)  TempSrc: Axillary  Axillary   SpO2:  100%  91%  Weight:      Height:        Intake/Output Summary (Last 24 hours) at 09/01/2018 1228 Last data filed at 09/01/2018 0600 Gross per 24 hour  Intake 1472.99 ml  Output 650 ml  Net 822.99 ml   Last 3 Weights 09/01/2018 08/31/2018 05/05/2018  Weight (lbs) 168 lb 3.4 oz 167 lb 15.9 oz 168 lb  Weight (kg) 76.3 kg 76.2 kg 76.204 kg      Telemetry    Sinus rhythm- Personally Reviewed  ECG    No new tracing today- Personally Reviewed  Physical Exam   Mildly agitated but not in serious distress GEN: No acute distress.   Neck: No JVD Cardiac: RRR, no murmurs, rubs, or gallops.  Respiratory:  bilateral end expiratory wheezing GI: Soft, nontender, non-distended  MS: No edema; No deformity. Neuro:  Nonfocal  Psych: Normal affect   Labs    Chemistry Recent Labs  Lab 08/31/18 0458 09/01/18 0253 09/01/18 0340  NA 138 139 139  K 3.8 4.6 4.0  CL 103 104  --   CO2 22 21*  --   GLUCOSE 200* 124*  --   BUN 17 17  --   CREATININE 1.22 1.39*  --   CALCIUM 10.0 9.4  --   PROT 8.3*  --   --   ALBUMIN 4.6  --   --   AST 34  --   --   ALT 14  --   --   ALKPHOS 85  --   --   BILITOT 1.0  --   --   GFRNONAA 60* 51*  --   GFRAA >60 59*  --   ANIONGAP 13 14  --      Hematology Recent Labs  Lab 08/31/18 0458 09/01/18 0253 09/01/18 0340  WBC  13.1* 12.3*  --   RBC 5.83* 5.38  --   HGB 17.9* 16.6 16.0  HCT 54.4* 50.0 47.0  MCV 93.3 92.9  --   MCH 30.7 30.9  --   MCHC 32.9 33.2  --   RDW 12.6 12.6  --   PLT 262 201  --     Cardiac Enzymes Recent Labs  Lab 08/31/18 0458 08/31/18 0952 08/31/18 1536  TROPONINI 0.04* 0.79* 1.91*   No results for input(s): TROPIPOC in the last 168 hours.   BNP Recent Labs  Lab 08/31/18 0459  BNP 285.0*     DDimer No results for input(s): DDIMER in the last 168 hours.   Radiology    Ct Head Wo Contrast  Result Date: 08/31/2018 CLINICAL DATA:  Altered level of consciousness, unexplained EXAM: CT HEAD WITHOUT CONTRAST TECHNIQUE: Contiguous axial images were obtained from the base of the skull through the vertex without intravenous contrast. COMPARISON:  None similar FINDINGS: Brain: 5 mm ovoid high-density focus in the subcortical high left frontal region. Background of confluent low-density in the bilateral cerebral white matter. Linear high-density within a left parietal sulcus which has the appearance of a vessel on sagittal reformats. No evidence of acute gray matter infarct. No  masslike finding or hydrocephalus. Vascular: No hyperdense vessel noted. Skull: Negative Sinuses/Orbits: The sinuses are essentially clear.  Negative orbits. Critical Value/emergent results were called by telephone at the time of interpretation on 08/31/2018 at 7:06 am to Dr. Ezequiel Essex , who verbally acknowledged these results. IMPRESSION: 1. 5 mm acute subcortical hemorrhage in the left frontal lobe. Extensive chronic white matter disease, question amyloid angiopathy given this location. 2. Attention to a minimally high-dense left parietal sulcus on follow-up. Electronically Signed   By: Monte Fantasia M.D.   On: 08/31/2018 07:06   Ct Angio Chest Pe W And/or Wo Contrast  Result Date: 08/31/2018 CLINICAL DATA:  Altered level of consciousness.  Elevated D-dimer EXAM: CT ANGIOGRAPHY CHEST WITH CONTRAST TECHNIQUE: Multidetector CT imaging of the chest was performed using the standard protocol during bolus administration of intravenous contrast. Multiplanar CT image reconstructions and MIPs were obtained to evaluate the vascular anatomy. CONTRAST:  193m OMNIPAQUE IOHEXOL 350 MG/ML SOLN COMPARISON:  02/20/2016 FINDINGS: Cardiovascular: Normal heart size. No pericardial effusion. Coronary calcification is present. There is no opacification of the aorta due to contrast timing. A single subsegmental pulmonary embolism is seen in the left lower lobe. Proximally the filling defect is eccentric, but there does appear to be branching into the downstream vessel and this is presumably acute in this setting. No large or second pulmonary embolism is seen. There is motion artifact at the bases. Pulmonary arteries are not enlarged and there is no right ventricular dilatation in this patient with elevated troponin Mediastinum/Nodes: Negative for adenopathy. Lungs/Pleura: Centrilobular emphysema. Patchy airway debris without collapse. There is no edema, consolidation, effusion, or pneumothorax. Endotracheal tube in good  position. Upper Abdomen: Orogastric tube in good position. Musculoskeletal: No acute or aggressive finding Critical Value/emergent results were called by telephone at the time of interpretation on 08/31/2018 at 7:04 am to Dr. SEzequiel Essex, who verbally acknowledged these results. Review of the MIP images confirms the above findings. IMPRESSION: 1. Single subsegmental pulmonary embolism to the left lower lobe. 2. Coronary atherosclerosis. 3. Emphysema. Electronically Signed   By: JMonte FantasiaM.D.   On: 08/31/2018 07:11   Dg Chest Port 1 View  Result Date: 09/01/2018 CLINICAL DATA:  Respiratory failure EXAM: PORTABLE CHEST  1 VIEW COMPARISON:  08/31/2018 FINDINGS: Endotracheal tube and gastric catheter are noted in satisfactory position. The lungs are hyperinflated consistent with COPD. No focal infiltrate or sizable effusion is noted. No bony abnormality is seen. IMPRESSION: Tubes and lines as described. Emphysematous change without acute abnormality. Electronically Signed   By: Inez Catalina M.D.   On: 09/01/2018 08:13   Dg Chest Portable 1 View  Result Date: 08/31/2018 CLINICAL DATA:  Endotracheal tube.  Respiratory distress EXAM: PORTABLE CHEST 1 VIEW COMPARISON:  05/14/2018 chest CT FINDINGS: Endotracheal tube tip just below the clavicular heads. The orogastric tube reaches the stomach. Hyperinflation. There is no edema, consolidation, effusion, or pneumothorax. Normal heart size. IMPRESSION: Hardware in expected position. COPD without acute superimposed finding. Electronically Signed   By: Monte Fantasia M.D.   On: 08/31/2018 05:13   Vas [place] Lower Extremity Venous (dvt)  Result Date: 08/31/2018  Lower Venous Study Indications: Pulmonary embolism.  Performing Technologist: Abram Sander RVS  Examination Guidelines: A complete evaluation includes B-mode imaging, spectral Doppler, color Doppler, and power Doppler as needed of all accessible portions of each vessel. Bilateral testing is considered an  integral part of a complete examination. Limited examinations for reoccurring indications may be performed as noted.  +---------+---------------+---------+-----------+----------+-------+  RIGHT     Compressibility Phasicity Spontaneity Properties Summary  +---------+---------------+---------+-----------+----------+-------+  CFV       Full            Yes       Yes                             +---------+---------------+---------+-----------+----------+-------+  SFJ       Full                                                      +---------+---------------+---------+-----------+----------+-------+  FV Prox   Full                                                      +---------+---------------+---------+-----------+----------+-------+  FV Mid    Full                                                      +---------+---------------+---------+-----------+----------+-------+  FV Distal Full                                                      +---------+---------------+---------+-----------+----------+-------+  PFV       Full                                                      +---------+---------------+---------+-----------+----------+-------+  POP       Full  Yes       Yes                             +---------+---------------+---------+-----------+----------+-------+  PTV       Full                                                      +---------+---------------+---------+-----------+----------+-------+  PERO      Full                                                      +---------+---------------+---------+-----------+----------+-------+   +---------+---------------+---------+-----------+----------+-------+  LEFT      Compressibility Phasicity Spontaneity Properties Summary  +---------+---------------+---------+-----------+----------+-------+  CFV       Full            Yes       Yes                             +---------+---------------+---------+-----------+----------+-------+  SFJ       Full                                                       +---------+---------------+---------+-----------+----------+-------+  FV Prox   Full                                                      +---------+---------------+---------+-----------+----------+-------+  FV Mid    Full                                                      +---------+---------------+---------+-----------+----------+-------+  FV Distal Full                                                      +---------+---------------+---------+-----------+----------+-------+  PFV       Full                                                      +---------+---------------+---------+-----------+----------+-------+  POP       Full            Yes       Yes                             +---------+---------------+---------+-----------+----------+-------+  PTV       Full                                                      +---------+---------------+---------+-----------+----------+-------+  PERO      Full                                                      +---------+---------------+---------+-----------+----------+-------+     Summary: Right: There is no evidence of deep vein thrombosis in the lower extremity. No cystic structure found in the popliteal fossa. Left: There is no evidence of deep vein thrombosis in the lower extremity. No cystic structure found in the popliteal fossa.  *See table(s) above for measurements and observations. Electronically signed by Monica Martinez MD on 08/31/2018 at 6:10:55 PM.    Final     Cardiac Studies   ECHO 08/31/2018   1. The left ventricle has severely reduced systolic function, with an ejection fraction of 20-25%. The cavity size was normal. There is mildly increased left ventricular wall thickness. Findings are consistent with Takatsubo cardiomyopathy or  multivessel CAD. Left ventricular diastolic Doppler parameters are consistent with pseudonormalization. Elevated left atrial and left ventricular end-diastolic pressures The E/e' is  >15. Left ventricular diffuse hypokinesis.  2. The right ventricle has moderately reduced systolic function. The cavity was normal. There is mildly increased right ventricular wall thickness.  3. The mitral valve is abnormal. Mild thickening of the mitral valve leaflet. There is mild mitral annular calcification present.  4. The tricuspid valve is grossly normal.  5. The aortic valve appears bicuspid. Mild sclerosis of the aortic valve. Aortic valve regurgitation is trivial by color flow Doppler. No stenosis of the aortic valve.  6. The inferior vena cava was dilated in size with <50% respiratory variability.   Patient Profile     73 y.o. male with a hx of HTN, COPD and OSA who is being seen today for the evaluation of new LV systolic dysfunction, abnormal ECG and elevated troponin after intubation for COPD exacerbation and incidentally found to have a small subsegmental PE and intracranial bleed.  Assessment & Plan    1. CHF: cause is uncertain and differential is broad, to include multivessel CAD, takotsubo sd and acute myocarditis.Ultimately will need coronary angio, but he is still not in any shape to do that. Echo yesterday suggested volume overload, but his creatinine is increasing and oxygenation is fair off vent, so will hold off diuretics for now. Not appropriate for RAAS inh or beta-blockers. Will try low dose Bidil if BP allows. 2. Abn troponin:  As above, etiology unclear. Reasonable to give ASA 81 mg daily. On statin, target LDL<70 if CAD identified, can increase pravastatin to 40 mg daily. 3. AKI: likely due to altered hemodynamics. 4. COPD exacerbation a acute hypoxic resp failure: increased wheezing after extubation, getting bronchodilators. 5. Acute subsegmental PE: does not appear large enough to cause such a severe acute illness. 6. ICH: likewise, appears to be an incidental finding, rather than a cause of decompensation and of ECG changes/wall motion abnormalities.     For  questions or updates, please contact [place] Please consult [email or web address] for contact  info under        Signed, Sanda Klein, MD  09/01/2018, 12:28 PM

## 2018-09-01 NOTE — Progress Notes (Signed)
PCCM Interval Progress Note  Extubated this AM.  Tolerated fine; however, starting to get slightly agitated with tachycardia and slight tachypnea (per RN, he has been working himself up for the past 10 minutes or so).  4mg  haldol administered already.  Will give 1mg  morphine and start precedex gtt now in attempt to control agitation.  Doubt will tolerate BiPAP; therefore, defer. If agitation improves but continues to have increased WOB, will try BiPAP.  Otherwise, will likely require re-intubation.  Discussed with RN and RT.   Montey Hora, Start Pulmonary & Critical Care Medicine Pager: [phone].  If no answer, [phone] 09/01/2018, 2:13 PM

## 2018-09-01 NOTE — Progress Notes (Signed)
PHARMACY - PHYSICIAN COMMUNICATION CRITICAL VALUE ALERT - BLOOD CULTURE IDENTIFICATION (BCID)  Alik Mawson is an 73 y.o. male who presented to [facility] on 08/31/2018 with a chief complaint of SOB.  Assessment:  BCID 1/4 coag negative staph, likely contaminant, on ctx for CAP  Name of physician (or Provider) Contacted: Nelda Marseille  Current antibiotics: Ceftriaxone  Changes to prescribed antibiotics recommended:  None  No results found for this or any previous visit.  Bertis Ruddy 09/01/2018  7:09 AM

## 2018-09-01 NOTE — Progress Notes (Signed)
SLP Cancellation Note  Patient Details Name: Landers Prajapati MRN: [id number] DOB: 07-02-1945   Cancelled treatment:       Reason Eval/Treat Not Completed: Medical issues which prohibited therapy. Patient is intubated and sedated. Will check next date for readiness for SLE.   Nadara Mode Tarrell 09/01/2018, 10:52 AM   Sonia Baller, MA, CCC-SLP Speech Therapy [place] [facility] Pager: [phone]

## 2018-09-01 NOTE — Progress Notes (Signed)
Duplicate/error

## 2018-09-01 NOTE — Procedures (Signed)
Intubation Procedure Note Jeremiah [name] [id number] 1945-12-19  Procedure: Intubation Indications: Respiratory insufficiency  Procedure Details Consent: Unable to obtain consent because of emergent medical necessity. Time Out: Verified patient identification, verified procedure, site/side was marked, verified correct patient position, special equipment/implants available, medications/allergies/relevent history reviewed, required imaging and test results available.  Performed  Drugs:  20 mg Etomidate, 25 mg Rocuronium. Glid x 1 with # 4blade. Grade 1 view. 7.5 cm tube visualized passing through vocal cords. Following intubation:  positive color change on ETCO2, condensation seen in endotracheal tube, equal breath sounds bilaterally.  Evaluation Hemodynamic Status: BP stable throughout; O2 sats: stable throughout Patient's Current Condition: stable Complications: No apparent complications Patient did tolerate procedure well. Chest X-ray ordered to verify placement.  CXR: tube position acceptable.    09/01/2018, 10:10 PM

## 2018-09-01 NOTE — Progress Notes (Signed)
OT Cancellation Note  Patient Details Name: Jeremiah [name] MRN: [id number] DOB: Oct 18, 1945   Cancelled Treatment:    Reason Eval/Treat Not Completed: Medical issues which prohibited therapy. Pt intubated and sedated.  [place], OTR/L Acute Rehab Services Pager [phone] Office [phone]     Almon Register 09/01/2018, 10:04 AM

## 2018-09-01 NOTE — Progress Notes (Signed)
Hypoglycemic Event  CBG: 52  Treatment: 25gm D50 IV  Symptoms: none (intubated/sedated)  Follow-up CBG: [id number] CBG Result:92  Possible Reasons for Event: NPO  Comments/MD notified: PA notified    Jeremiah [name]  [name]

## 2018-09-01 NOTE — Progress Notes (Signed)
PT Cancellation Note  Patient Details Name: Jeremiah [name] MRN: [id number] DOB: 13-Jan-1946   Cancelled Treatment:    Reason Eval/Treat Not Completed: Patient not medically ready. Pt intubated and sedated.   [place] 09/01/2018, 9:45 AM [place] Pager [phone] Office [phone]

## 2018-09-01 NOTE — Progress Notes (Signed)
STROKE TEAM PROGRESS NOTE   SUBJECTIVE (INTERVAL HISTORY) His RN CCM PA are at the bedside for extubation. He was then extubated and so far tolerating well. Still confused and not following commands.    OBJECTIVE Temp:  [97.2 F (36.2 C)-98.6 F (37 C)] 97.2 F (36.2 C) (05/19 0741) Pulse Rate:  [64-87] 66 (05/19 0806) Cardiac Rhythm: Normal sinus rhythm (05/18 2000) Resp:  [20] 20 (05/19 0600) BP: (88-131)/(72-102) 97/79 (05/19 0806) SpO2:  [99 %-100 %] 100 % (05/19 0806) FiO2 (%):  [30 %-40 %] 30 % (05/19 0806) Weight:  [76.3 kg] 76.3 kg (05/19 0500)  Recent Labs  Lab 09/01/18 0341 09/01/18 0345 09/01/18 0552 09/01/18 0737 09/01/18 0844  GLUCAP 42* 73 76 52* 92   Recent Labs  Lab 08/31/18 0458 09/01/18 0253 09/01/18 0340  NA 138 139 139  K 3.8 4.6 4.0  CL 103 104  --   CO2 22 21*  --   GLUCOSE 200* 124*  --   BUN 17 17  --   CREATININE 1.22 1.39*  --   CALCIUM 10.0 9.4  --   MG  --  2.2  --   PHOS  --  3.0  --    Recent Labs  Lab 08/31/18 0458  AST 34  ALT 14  ALKPHOS 85  BILITOT 1.0  PROT 8.3*  ALBUMIN 4.6   Recent Labs  Lab 08/31/18 0458 09/01/18 0253 09/01/18 0340  WBC 13.1* 12.3*  --   NEUTROABS 9.6*  --   --   HGB 17.9* 16.6 16.0  HCT 54.4* 50.0 47.0  MCV 93.3 92.9  --   PLT 262 201  --    Recent Labs  Lab 08/31/18 0458 08/31/18 0952 08/31/18 1536  TROPONINI 0.04* 0.79* 1.91*   No results for input(s): LABPROT, INR in the last 72 hours. Recent Labs    08/31/18 0444  COLORURINE YELLOW  LABSPEC 1.030  PHURINE 5.0  GLUCOSEU 150*  HGBUR NEGATIVE  BILIRUBINUR NEGATIVE  KETONESUR 5*  PROTEINUR 100*  NITRITE NEGATIVE  LEUKOCYTESUR NEGATIVE       Component Value Date/Time   CHOL 159 08/31/2018 1830   TRIG 48 08/31/2018 1830   HDL 66 08/31/2018 1830   CHOLHDL 2.4 08/31/2018 1830   VLDL 10 08/31/2018 1830   LDLCALC 83 08/31/2018 1830   Lab Results  Component Value Date   HGBA1C 5.6 08/31/2018      Component Value  Date/Time   LABOPIA NONE DETECTED 08/31/2018 2049   COCAINSCRNUR NONE DETECTED 08/31/2018 2049   LABBENZ POSITIVE (A) 08/31/2018 2049   AMPHETMU NONE DETECTED 08/31/2018 2049   THCU NONE DETECTED 08/31/2018 2049   LABBARB NONE DETECTED 08/31/2018 2049    No results for input(s): ETH in the last 168 hours.  I have personally reviewed the radiological images below and agree with the radiology interpretations.  Ct Head Wo Contrast  Result Date: 08/31/2018 CLINICAL DATA:  Altered level of consciousness, unexplained EXAM: CT HEAD WITHOUT CONTRAST TECHNIQUE: Contiguous axial images were obtained from the base of the skull through the vertex without intravenous contrast. COMPARISON:  None similar FINDINGS: Brain: 5 mm ovoid high-density focus in the subcortical high left frontal region. Background of confluent low-density in the bilateral cerebral white matter. Linear high-density within a left parietal sulcus which has the appearance of a vessel on sagittal reformats. No evidence of acute gray matter infarct. No masslike finding or hydrocephalus. Vascular: No hyperdense vessel noted. Skull: Negative Sinuses/Orbits: The sinuses are essentially  clear.  Negative orbits. Critical Value/emergent results were called by telephone at the time of interpretation on 08/31/2018 at 7:06 am to Dr. Ezequiel Essex , who verbally acknowledged these results. IMPRESSION: 1. 5 mm acute subcortical hemorrhage in the left frontal lobe. Extensive chronic white matter disease, question amyloid angiopathy given this location. 2. Attention to a minimally high-dense left parietal sulcus on follow-up. Electronically Signed   By: Monte Fantasia M.D.   On: 08/31/2018 07:06   Ct Angio Chest Pe W And/or Wo Contrast  Result Date: 08/31/2018 CLINICAL DATA:  Altered level of consciousness.  Elevated D-dimer EXAM: CT ANGIOGRAPHY CHEST WITH CONTRAST TECHNIQUE: Multidetector CT imaging of the chest was performed using the standard protocol  during bolus administration of intravenous contrast. Multiplanar CT image reconstructions and MIPs were obtained to evaluate the vascular anatomy. CONTRAST:  141mL OMNIPAQUE IOHEXOL 350 MG/ML SOLN COMPARISON:  02/20/2016 FINDINGS: Cardiovascular: Normal heart size. No pericardial effusion. Coronary calcification is present. There is no opacification of the aorta due to contrast timing. A single subsegmental pulmonary embolism is seen in the left lower lobe. Proximally the filling defect is eccentric, but there does appear to be branching into the downstream vessel and this is presumably acute in this setting. No large or second pulmonary embolism is seen. There is motion artifact at the bases. Pulmonary arteries are not enlarged and there is no right ventricular dilatation in this patient with elevated troponin Mediastinum/Nodes: Negative for adenopathy. Lungs/Pleura: Centrilobular emphysema. Patchy airway debris without collapse. There is no edema, consolidation, effusion, or pneumothorax. Endotracheal tube in good position. Upper Abdomen: Orogastric tube in good position. Musculoskeletal: No acute or aggressive finding Critical Value/emergent results were called by telephone at the time of interpretation on 08/31/2018 at 7:04 am to Dr. Ezequiel Essex , who verbally acknowledged these results. Review of the MIP images confirms the above findings. IMPRESSION: 1. Single subsegmental pulmonary embolism to the left lower lobe. 2. Coronary atherosclerosis. 3. Emphysema. Electronically Signed   By: Monte Fantasia M.D.   On: 08/31/2018 07:11   Dg Chest Port 1 View  Result Date: 09/01/2018 CLINICAL DATA:  Respiratory failure EXAM: PORTABLE CHEST 1 VIEW COMPARISON:  08/31/2018 FINDINGS: Endotracheal tube and gastric catheter are noted in satisfactory position. The lungs are hyperinflated consistent with COPD. No focal infiltrate or sizable effusion is noted. No bony abnormality is seen. IMPRESSION: Tubes and lines as  described. Emphysematous change without acute abnormality. Electronically Signed   By: Inez Catalina M.D.   On: 09/01/2018 08:13   Dg Chest Portable 1 View  Result Date: 08/31/2018 CLINICAL DATA:  Endotracheal tube.  Respiratory distress EXAM: PORTABLE CHEST 1 VIEW COMPARISON:  05/14/2018 chest CT FINDINGS: Endotracheal tube tip just below the clavicular heads. The orogastric tube reaches the stomach. Hyperinflation. There is no edema, consolidation, effusion, or pneumothorax. Normal heart size. IMPRESSION: Hardware in expected position. COPD without acute superimposed finding. Electronically Signed   By: Monte Fantasia M.D.   On: 08/31/2018 05:13   Vas [place] Lower Extremity Venous (dvt)  Result Date: 08/31/2018  Lower Venous Study Indications: Pulmonary embolism.  Performing Technologist: Abram Sander RVS  Examination Guidelines: A complete evaluation includes B-mode imaging, spectral Doppler, color Doppler, and power Doppler as needed of all accessible portions of each vessel. Bilateral testing is considered an integral part of a complete examination. Limited examinations for reoccurring indications may be performed as noted.  +---------+---------------+---------+-----------+----------+-------+ RIGHT    CompressibilityPhasicitySpontaneityPropertiesSummary +---------+---------------+---------+-----------+----------+-------+ CFV      Full  Yes      Yes                          +---------+---------------+---------+-----------+----------+-------+ SFJ      Full                                                 +---------+---------------+---------+-----------+----------+-------+ FV Prox  Full                                                 +---------+---------------+---------+-----------+----------+-------+ FV Mid   Full                                                 +---------+---------------+---------+-----------+----------+-------+ FV DistalFull                                                  +---------+---------------+---------+-----------+----------+-------+ PFV      Full                                                 +---------+---------------+---------+-----------+----------+-------+ POP      Full           Yes      Yes                          +---------+---------------+---------+-----------+----------+-------+ PTV      Full                                                 +---------+---------------+---------+-----------+----------+-------+ PERO     Full                                                 +---------+---------------+---------+-----------+----------+-------+   +---------+---------------+---------+-----------+----------+-------+ LEFT     CompressibilityPhasicitySpontaneityPropertiesSummary +---------+---------------+---------+-----------+----------+-------+ CFV      Full           Yes      Yes                          +---------+---------------+---------+-----------+----------+-------+ SFJ      Full                                                 +---------+---------------+---------+-----------+----------+-------+ FV Prox  Full                                                 +---------+---------------+---------+-----------+----------+-------+  FV Mid   Full                                                 +---------+---------------+---------+-----------+----------+-------+ FV DistalFull                                                 +---------+---------------+---------+-----------+----------+-------+ PFV      Full                                                 +---------+---------------+---------+-----------+----------+-------+ POP      Full           Yes      Yes                          +---------+---------------+---------+-----------+----------+-------+ PTV      Full                                                  +---------+---------------+---------+-----------+----------+-------+ PERO     Full                                                 +---------+---------------+---------+-----------+----------+-------+     Summary: Right: There is no evidence of deep vein thrombosis in the lower extremity. No cystic structure found in the popliteal fossa. Left: There is no evidence of deep vein thrombosis in the lower extremity. No cystic structure found in the popliteal fossa.  *See table(s) above for measurements and observations. Electronically signed by Monica Martinez MD on 08/31/2018 at 6:10:55 PM.    Final      PHYSICAL EXAM  Temp:  [97.2 F (36.2 C)-98.6 F (37 C)] 97.2 F (36.2 C) (05/19 0741) Pulse Rate:  [64-87] 66 (05/19 0806) Resp:  [20] 20 (05/19 0600) BP: (88-131)/(72-102) 97/79 (05/19 0806) SpO2:  [99 %-100 %] 100 % (05/19 0806) FiO2 (%):  [30 %-40 %] 30 % (05/19 0806) Weight:  [76.3 kg] 76.3 kg (05/19 0500)  General - Well nourished, well developed, confused and agitated just extubated.  Ophthalmologic - fundi not visualized due to noncooperation.  Cardiovascular - Regular rhythm, with tachycardia.  Neuro - just extubated, eyes closed, agitated and confused, keep yelling "come on mom" "help" "come on man" "please" "come on baby". Not following commands. With eye forced opening, eyes middle position, sluggish to light, inconsistently blinking to visual threat, not tracking. No facial asymmetry, tongue protrusion not cooperative. Moving all extremities symmetrically. DTR not cooperative. Sensation, coordination and gait not tested.   ASSESSMENT/PLAN [name] is a 73 y.o. male with history of COPD, HTN, OSA admitted for SOB. No tPA given due to [place].    Encephalopathy with agitation  Not sure about baseline mental status, was reported to have baseline dementia  Currently agitated  after extubation  Not following commands  Not able to be explained by the tiny  hyperdensity on CT head  CCM on board  UA showed UTI on rocephin  Hypotension  blood culture pending  Off sedation  Possible tiny ICH:  left frontal tiny hyperdensity on CT head   MRI  Pending to confirm the [place]  MRA  Pending to rule out AVM or aneurysm  Do not think MRI can be done due to agitation, will repeat CT head  LE venous doppler negative for DVT  2D Echo  EF 20-25%  LDL 83  HgbA1c 5.6  UDS + for benzo  SCDs for VTE prophylaxis  aspirin 81 mg daily prior to admission, now on No antithrombotic due to [place]  Ongoing aggressive stroke risk factor management  Therapy recommendations:  Pending   Disposition:  Pending   PE at [facility]  CT chest showed small L[facility] segmental PE  LE venous doppler no DVT  Not on AC due to small ICH  Repeat CT head pending  If remains relatively tiny ICH, and pt is indicated for [facility], will recommend heparin IV with no bolus and heparin level 0.3-0.5 and close neuro check. If any neuro changes, please repeat CT head stat.   Cardiomyopathy with elevated troponin  Cardiology on board  Troponin elevated 0.79->1.91  Card recommend ischemic work up after extubation  Concerning for Tak[place]ubo cardiomyopathy   Discussed with Dr. Sallyanne Kuster and no indication for [facility] at this time.   Hypoglycemia  Glucose this am 73  Received D50  Change IVF to D5NS @ 40  Gentle hydration due to low EF  Respiratory failure  Intubated on admission  Now extubated  CXR clear  On rocephin  Hypotension . BP on the low end . CCM on board  BP goal < 140 for now  Close monitoring  Hyperlipidemia  Home meds:  pravastatin 10  LDL 83, goal < 70  Continue resume statin once passed swallow  UTI  UA WBC > 50  On rocephin  Other Stroke Risk Factors  Advanced age  Obstructive sleep apnea, on CPAP at home  Other Active Problems  COPD  Hospital day # 1  This patient is critically ill due to [place], respiratory failure,  cardiomyopathy, PE, hypotension and at significant risk of neurological worsening, death form shock, hematoma expansion, brain edema, heart failure, seizure. This patient's care requires constant monitoring of vital signs, hemodynamics, respiratory and cardiac monitoring, review of multiple databases, neurological assessment, discussion with family, other specialists and medical decision making of high complexity. I spent 40 minutes of neurocritical care time in the care of this patient. I have discussed with Dr. Sallyanne Kuster and Dr. Franco Nones.   Rosalin Hawking, MD PhD Stroke Neurology 09/01/2018 11:11 AM    To contact Stroke Continuity provider, please refer to [email or web address] After hours, contact General Neurology

## 2018-09-01 NOTE — Progress Notes (Signed)
Inpatient Diabetes Program Recommendations  AACE/ADA: New Consensus Statement on Inpatient Glycemic Control (2015)  Target Ranges:  Prepandial:   less than 140 mg/dL      Peak postprandial:   less than 180 mg/dL (1-2 hours)      Critically ill patients:  140 - 180 mg/dL    Results for Jeremiah [name], Jeremiah [name] (MRN [id number]) as of 09/01/2018 10:44  Ref. Range 08/31/2018 11:23 08/31/2018 15:33 08/31/2018 19:23 08/31/2018 23:30 09/01/2018 03:41 09/01/2018 03:45 09/01/2018 05:52 09/01/2018 07:37 09/01/2018 08:44  Glucose-Capillary Latest Ref Range: 70 - 99 mg/dL 93 138 (H)  2 units NOVOLOG  107 (H) 88 42 (LL) 73 76 52 (L) 92     Admit with: COPD/ PE/ L Frontal lobe Hemorrhage  History: COPD, HTN  No History of Diabetes noted  Current Orders: Novolog Moderate Correction Scale/ SSI (0-15 units) Q4 hours      MD- Note patient received 2 units Novolog SSI yesterday at 4pm.  No other Insulin given after this dose was given.  Hypoglycemic at 4am and 8 am today.  Please consider stopping Novolog SSI for now  Recommend keeping orders for CBG checks Q4 hours     --Will follow patient during hospitalization--  Wyn Quaker RN, MSN, CDE Diabetes Coordinator Inpatient Glycemic Control Team Team Pager: [phone] (8a-5p)

## 2018-09-01 NOTE — Procedures (Signed)
Extubation Procedure Note  Patient Details:   Name: Jeremiah [name] DOB: February 26, 1946 MRN: [id number]   Airway Documentation:  Airway 7.5 mm (Active)  Secured at (cm) 23 cm 08/31/2018 12:00 AM  Measured From Lips 08/31/2018  5:10 AM  [place] 08/31/2018  5:10 AM  Secured By [employer] 08/31/2018  5:10 AM   Vent end date: 09/01/18 Vent end time: 1115   Evaluation  O2 sats: stable throughout Complications: No apparent complications Patient did tolerate procedure well. Bilateral Breath Sounds: Clear, Diminished   Yes.   Pt extubated per order. Pt suctioned orally and via ETT prior with minimal secretions. Pt extubated to 3L nasal cannula. Pt able to speak name, good cough and no stridor heard at this time. RRT will continue to monitor.   Jeremiah [name] 09/01/2018, 11:19 AM

## 2018-09-01 NOTE — TOC Initial Note (Signed)
Transition of Care [facility]) - Initial/Assessment Note    Patient Details  Name: Jeremiah [name] MRN: [id number] Date of Birth: [birth year]  [facility] CM/SW Contact:    Pollie Friar, RN Phone Number: 09/01/2018, 2:37 PM  Clinical Narrative:                 CM received phone call from patient's spouse today. She voiced concerns that the patient has been getting worse over the last several weeks. She states she is not in the best health to be assisting him. CM inquired about SNF rehab at d/c and she doesn't want him in a facility. She inquired about assistance at home. Pt is 100% services connected through the [place]. CM called his CM at the [place]: Mathis Fare and she states he will qualify for home aide services. CM provided this information to Mrs Lacko and she is going to f/u with this information.  Mrs Ballengee is interested in [facility] services at time of d/c.  CM following for further d/c needs.   Expected Discharge Plan: Skilled Nursing Facility Barriers to Discharge: Continued Medical Work up   Patient Goals and CMS Choice        Expected Discharge Plan and Services Expected Discharge Plan: [place]   Discharge Planning Services: CM Consult                                          Prior Living Arrangements/Services   Lives with:: Spouse                   Activities of Daily Living      Permission Sought/Granted                  Emotional Assessment              Admission diagnosis:  Acute respiratory failure with hypoxia ([place]) [J96.01] Patient Active Problem List   Diagnosis Date Noted  . Acute respiratory failure with hypoxia ([place]) 08/31/2018  . Intracerebral hemorrhage 08/31/2018  . Chronic rhinitis 03/02/2018  . Chronic cough 01/22/2017  . Constipation 01/01/2016  . Acute URI 03/13/2015  . Medicare annual wellness visit, subsequent 12/28/2014  . Essential hypertension 12/12/2014  . Generalized anxiety disorder  11/02/2014  . OSA (obstructive sleep apnea) 04/26/2014  . COPD, frequent exacerbations ([place]) 04/25/2013   PCP:  Selena Lesser, MD Pharmacy:   [id number] - [place], [place] - [address] [place] [address] Phone: [phone] Fax: [phone]     Social Determinants of Health (SDOH) Interventions    Readmission Risk Interventions No flowsheet data found.

## 2018-09-01 NOTE — Progress Notes (Signed)
NAME:  Jeremiah [name], MRN:  [id number], DOB:  1945/07/08, LOS: 1 ADMISSION DATE:  08/31/2018, CONSULTATION DATE:  08/31/18 REFERRING MD:  [facility] - [facility]  CHIEF COMPLAINT:  SOB   Brief History   Jeremiah [name] is a 73 y.o. male who presented to [facility] with SOB felt to be due to AECOPD.  Required intubation after failing NIMV.  Incidentally found to have small LLL PE and small 79mm left frontal lobe hemorrhage; therefore, transferred to [facility].  History of present illness   Pt is encephelopathic; therefore, this HPI is obtained from chart review. Jeremiah [name] is a 73 y.o. male who has a PMH as outlined below including Gold III COPD for which he is followed by Dr. Chase Caller (see "past medical history").  He presented to [facility]18 with SOB.  He was placed on NIMV but failed and required intubation.  In ED, had significant bronchospasm, felt to be due to AECOPD. Per wife, SOB had began 1 week prior and progressively worsened.    CTA chest with incidental finding small LLL PE.  CT head with incidental finding small 29mm left frontal lobe hemorrhage.  Heparin deferred in lieu of this.  COVID negative.  In addition, EKG demonstrated J point elevation.  Discussed with cardiology who recommended trending troponins.  No emergent procedure indicated.  Pt transferred to [facility] for further evaluation and management.  Past Medical History  COPD, OSA, HTN, PTSD, Depression.  Significant Hospital Events   5/18 > admit.  Consults:  None.  Procedures:  ETT 5/18 >   Significant Diagnostic Tests:  CXR 5/18 > COPD. CT head 5/18 > 25mm subcortical hemorrhage in left frontal lobe.  Chronic white matter disease, question amyloid angiopathy. CTA chest 5/18 > single subsegmental PE in LLL.  Emphysema.  LE duplex 5/18 > negative for DVT. Echo 5/19 > EF 20-25%, Takatsubo cardiomopathy. CT head 5/19 >  MRI brain 5/19 >   Micro Data:  Blood 5/18 >  Sputum 5/18 >  RVP 5/18 >  SARS CoV2 5/18 >   Antimicrobials:   Azithromycin 5/18 x 1 Ceftriaxone 5/18 >   Interim history/subjective:  Agitated during bath this AM; therefore, sedation increased. Starting to wean sedation now to facilitate SBT.  Objective:  Blood pressure 97/79, pulse 66, temperature (!) 97.2 F (36.2 C), temperature source Axillary, resp. rate 20, height 6\' 3"  (1.905 m), weight 76.3 kg, SpO2 100 %.    Vent Mode: PRVC FiO2 (%):  [30 %-40 %] 30 % Set Rate:  [20 bmp] 20 bmp Vt Set:  [670 mL] 670 mL PEEP:  [5 cmH20] 5 cmH20 Plateau Pressure:  [18 cmH20-24 cmH20] 21 cmH20   Intake/Output Summary (Last 24 hours) at 09/01/2018 0944 Last data filed at 09/01/2018 0600 Gross per 24 hour  Intake 1713.25 ml  Output 975 ml  Net 738.25 ml   Filed Weights   08/31/18 0439 09/01/18 0500  Weight: 76.2 kg 76.3 kg    Examination:  General: Adult male, in NAD. Neuro: Sedated and not follow commands yet (sedation being weaned now). HEENT: /AT. Sclerae anicteric.  ETT in place. Cardiovascular: RRR, no M/R/G.  Lungs: Respirations even and unlabored.  CTA bilaterally, No W/R/R.  Abdomen: BS x 4, soft, NT/ND.  Musculoskeletal: No gross deformities, no edema.  Skin: Intact, warm, no rashes.  Assessment & Plan:   AECOPD - required intubation after failing NIMV. - SBT trial this AM for hopeful extubation. - Bronchial hygiene. - Empiric ceftriaxone. - Follow cultures. - Defer  steroids as not bronchospastic. - Follow CXR.  Small LLL PE. - Assess LE duplex, echo. - Defer heparin given small left frontal lobe hemorrhage.  Takatsubo's with possible myocarditis per cards - Echo with EF 20-25%. - Cardiology following and considering angiography after pt is extubated.  Small left frontal lobe hemorrhage. - Neuro consulted, no further recs besides repeat head CT and assess MRI brain and BP control with goal SBP < 140.  Hx HTN, HLD. - Cleviprex and Labetalol PRN to maintain SBP < 140. - Hold preadmission amlodipine, ASA. - Continue  preadmission pravastatin.  Hx depression. - Hold preadmission trazodone, alprazolam.   Best Practice:  Diet: NPO. Pain/Anxiety/Delirium protocol (if indicated): Propofol gtt / Fentanyl PRN.  RASS goal 0 to -1. VAP protocol (if indicated): In place DVT prophylaxis: SCD's. GI prophylaxis: PPI. Glucose control: SSI. Mobility: Bedrest. Code Status: Full. Family Communication: Brother in Sports coach updated 5/18 and 5/19.  He is a physician and is relaying information to pt's spouse. Disposition: ICU.   Montey Hora, [facility] Pager: [phone].  If no answer, [phone] 09/01/2018, 10:05 AM

## 2018-09-01 NOTE — Progress Notes (Addendum)
[facility] PCCM PROGRESS NOTE  S: Called to see patient for respiratory distress. Briefly this is a 73 year old gentleman admitted with AECOPD requiring intubation. Course complicated by [facility] cardiomyopathy/acute myocarditis with EF 20-25%, small LLL PE, and small left frontal lobe hemorrhage.   He was extuabted 1/66 and was complicated by agitation. He was started on precedex. BiPAP was attempted, but he required restraints so BiPAp could not be used. Around 2000 he developed worsening dyspnea and stridor. I was called to evaluate.   O: BP 118/87   Pulse 81   Temp 99.9 F (37.7 C)   Resp (!) 28   Ht 6\' 3"  (1.905 m)   Wt 76.3 kg   SpO2 98%   BMI 21.02 kg/m   General:  Chronically ill appearing elderly male Neuro:  Lethargic (on max dose precedex and had just received morphine infusion) HEENT:  [place]/AT, PERRL, no JVD Cardiovascular:  RRR, no MRG Lungs:  Stridor audible throughout Abdomen:  Soft, non-tender, non-distended Musculoskeletal:  No acute deformity Skin:  Intact, MMM   A/P: - Reintubate - Decadron 6mg  q6 hours - Transtition from precedex back to propofol (precedex ineffective)  - Full vent support - CXR - ABG  Remaining A/P per rounding team.   Georgann Housekeeper, ACNP [facility] Pulmonology/Critical Care Pager [phone] or [phone]

## 2018-09-02 ENCOUNTER — Inpatient Hospital Stay (HOSPITAL_COMMUNITY): Payer: Medicare Other

## 2018-09-02 ENCOUNTER — Telehealth: Payer: Self-pay | Admitting: Internal Medicine

## 2018-09-02 DIAGNOSIS — I680 Cerebral amyloid angiopathy: Secondary | ICD-10-CM

## 2018-09-02 LAB — BASIC METABOLIC PANEL
Anion gap: 14 (ref 5–15)
Anion gap: 10 (ref 5–15)
BUN: 19 mg/dL (ref 8–23)
BUN: 18 mg/dL (ref 8–23)
CO2: 19 mmol/L — ABNORMAL LOW (ref 22–32)
CO2: 28 mmol/L (ref 22–32)
Calcium: 9.2 mg/dL (ref 8.9–10.3)
Calcium: 9.4 mg/dL (ref 8.9–10.3)
Chloride: 110 mmol/L (ref 98–111)
Chloride: 104 mmol/L (ref 98–111)
Creatinine, Ser: 1.01 mg/dL (ref 0.61–1.24)
Creatinine, Ser: 1.22 mg/dL (ref 0.61–1.24)
GFR calc Af Amer: >60 mL/min (ref >60)
GFR calc Af Amer: >60 mL/min (ref >60)
GFR calc non Af Amer: >60 mL/min (ref >60)
GFR calc non Af Amer: 60 mL/min — ABNORMAL LOW (ref >60)
Glucose, Bld: 104 mg/dL — ABNORMAL HIGH (ref 70–99)
Glucose, Bld: 159 mg/dL — ABNORMAL HIGH (ref 70–99)
Potassium: 4.8 mmol/L (ref 3.5–5.1)
Potassium: 3.7 mmol/L (ref 3.5–5.1)
Sodium: 143 mmol/L (ref 135–145)
Sodium: 142 mmol/L (ref 135–145)

## 2018-09-02 LAB — CBC
HCT: 49.9 % (ref 39.0–52.0)
Hemoglobin: 16 g/dL (ref 13.0–17.0)
MCH: 30.5 pg (ref 26.0–34.0)
MCHC: 32.1 g/dL (ref 30.0–36.0)
MCV: 95.2 fL (ref 80.0–100.0)
Platelets: 156 10*3/uL (ref 150–400)
RBC: 5.24 MIL/uL (ref 4.22–5.81)
RDW: 12.7 % (ref 11.5–15.5)
WBC: 13.8 10*3/uL — ABNORMAL HIGH (ref 4.0–10.5)
nRBC: 0 % (ref 0.0–0.2)

## 2018-09-02 LAB — GLUCOSE, CAPILLARY
Glucose-Capillary: 100 mg/dL — ABNORMAL HIGH (ref 70–99)
Glucose-Capillary: 101 mg/dL — ABNORMAL HIGH (ref 70–99)
Glucose-Capillary: 65 mg/dL — ABNORMAL LOW (ref 70–99)
Glucose-Capillary: 75 mg/dL (ref 70–99)
Glucose-Capillary: 51 mg/dL — ABNORMAL LOW (ref 70–99)
Glucose-Capillary: 71 mg/dL (ref 70–99)
Glucose-Capillary: 73 mg/dL (ref 70–99)

## 2018-09-02 LAB — PHOSPHORUS
Phosphorus: 3.3 mg/dL (ref 2.5–4.6)
Phosphorus: 3.3 mg/dL (ref 2.5–4.6)
Phosphorus: 3.3 mg/dL (ref 2.5–4.6)

## 2018-09-02 LAB — MAGNESIUM
Magnesium: 2.3 mg/dL (ref 1.7–2.4)
Magnesium: 2.3 mg/dL (ref 1.7–2.4)
Magnesium: 2.5 mg/dL — ABNORMAL HIGH (ref 1.7–2.4)

## 2018-09-02 MED ORDER — IOHEXOL 300 MG/ML  SOLN
50.0000 mL | Freq: Once | INTRAMUSCULAR | Status: AC | PRN
  Administered 2018-09-02: 20 mL via ORAL

## 2018-09-02 MED ORDER — QUETIAPINE FUMARATE 50 MG PO TABS
25.0000 mg | ORAL_TABLET | Freq: Every day | ORAL | Status: DC
  Administered 2018-09-02 – 2018-09-05 (×4): 25 mg
  Filled 2018-09-02 (×4): qty 1

## 2018-09-02 MED ORDER — FENTANYL CITRATE (PF) 100 MCG/2ML IJ SOLN
INTRAMUSCULAR | Status: AC
  Filled 2018-09-02: qty 2

## 2018-09-02 MED ORDER — PRO-STAT SUGAR FREE PO LIQD: 30.0000 mL | Freq: Two times a day (BID) | ORAL | Status: DC

## 2018-09-02 MED ORDER — CHLORHEXIDINE GLUCONATE 0.12% ORAL RINSE (MEDLINE KIT)
15.0000 mL | Freq: Two times a day (BID) | OROMUCOSAL | Status: DC
  Administered 2018-09-02 – 2018-09-11 (×17): 15 mL via OROMUCOSAL

## 2018-09-02 MED ORDER — FUROSEMIDE 10 MG/ML IJ SOLN
20.0000 mg | Freq: Two times a day (BID) | INTRAMUSCULAR | Status: DC
  Administered 2018-09-02 – 2018-09-03 (×2): 20 mg via INTRAVENOUS
  Filled 2018-09-02 (×2): qty 2

## 2018-09-02 MED ORDER — TRAZODONE HCL 50 MG PO TABS
50.0000 mg | ORAL_TABLET | Freq: Every day | ORAL | Status: DC
  Administered 2018-09-02 – 2018-09-06 (×5): 50 mg via ORAL
  Filled 2018-09-02 (×5): qty 1

## 2018-09-02 MED ORDER — LOSARTAN POTASSIUM 25 MG PO TABS
12.5000 mg | ORAL_TABLET | Freq: Every day | ORAL | Status: DC
  Administered 2018-09-02 – 2018-09-03 (×2): 12.5 mg via ORAL
  Filled 2018-09-02 (×4): qty 0.5

## 2018-09-02 MED ORDER — FUROSEMIDE 10 MG/ML IJ SOLN
40.0000 mg | Freq: Once | INTRAMUSCULAR | Status: AC
  Administered 2018-09-02: 40 mg via INTRAVENOUS
  Filled 2018-09-02: qty 4

## 2018-09-02 MED ORDER — LIDOCAINE VISCOUS HCL 2 % MT SOLN
OROMUCOSAL | Status: AC
  Administered 2018-09-02: 3 mL via OROMUCOSAL
  Filled 2018-09-02: qty 15

## 2018-09-02 MED ORDER — LIDOCAINE VISCOUS HCL 2 % MT SOLN
15.0000 mL | Freq: Once | OROMUCOSAL | Status: AC
  Administered 2018-09-02: 13:00:00 3 mL via OROMUCOSAL
  Filled 2018-09-02: qty 15

## 2018-09-02 MED ORDER — PRO-STAT SUGAR FREE PO LIQD
60.0000 mL | Freq: Two times a day (BID) | ORAL | Status: DC
  Administered 2018-09-02 – 2018-09-04 (×5): 60 mL
  Filled 2018-09-02 (×5): qty 60

## 2018-09-02 MED ORDER — VITAL HIGH PROTEIN PO LIQD: 1000.0000 mL | ORAL | Status: DC

## 2018-09-02 MED ORDER — ORAL CARE MOUTH RINSE
15.0000 mL | OROMUCOSAL | Status: DC
  Administered 2018-09-02: 15 mL via OROMUCOSAL

## 2018-09-02 MED ORDER — FENTANYL CITRATE (PF) 100 MCG/2ML IJ SOLN
50.0000 ug | INTRAMUSCULAR | Status: DC | PRN
  Administered 2018-09-03 – 2018-09-04 (×5): 50 ug via INTRAVENOUS
  Administered 2018-09-04: 25 ug via INTRAVENOUS
  Administered 2018-09-06 – 2018-09-07 (×2): 50 ug via INTRAVENOUS
  Filled 2018-09-02 (×9): qty 2

## 2018-09-02 MED ORDER — DEXTROSE 50 % IV SOLN
INTRAVENOUS | Status: AC
  Administered 2018-09-02: 15:00:00 50 mL via INTRAVENOUS
  Filled 2018-09-02: qty 50

## 2018-09-02 MED ORDER — VITAL AF 1.2 CAL PO LIQD
1000.0000 mL | ORAL | Status: DC
  Administered 2018-09-02: 1000 mL

## 2018-09-02 MED ORDER — FENTANYL CITRATE (PF) 100 MCG/2ML IJ SOLN
100.0000 ug | Freq: Once | INTRAMUSCULAR | Status: AC
  Administered 2018-09-02: 100 ug via INTRAVENOUS

## 2018-09-02 MED ORDER — PHENYLEPHRINE HCL-NACL 10-0.9 MG/250ML-% IV SOLN
0.0000 ug/min | INTRAVENOUS | Status: DC
  Administered 2018-09-02: 20 ug/min via INTRAVENOUS
  Filled 2018-09-02: qty 250

## 2018-09-02 MED ORDER — DEXTROSE 50 % IV SOLN
50.0000 mL | Freq: Once | INTRAVENOUS | Status: AC
  Administered 2018-09-02: 15:00:00 50 mL via INTRAVENOUS

## 2018-09-02 MED ORDER — ALPRAZOLAM 0.5 MG PO TABS
0.5000 mg | ORAL_TABLET | Freq: Two times a day (BID) | ORAL | Status: DC
  Administered 2018-09-02 – 2018-09-07 (×11): 0.5 mg
  Filled 2018-09-02 (×11): qty 1

## 2018-09-02 NOTE — Telephone Encounter (Signed)
Pts. Wife is calling back

## 2018-09-02 NOTE — Progress Notes (Signed)
Initial Nutrition Assessment  RD working remotely.  DOCUMENTATION CODES:   Not applicable  INTERVENTION:   Tube feeding: - Vital AF 1.2 @ 30 ml/hr (720 ml/day) - Pro-stat 60 ml BID  Tube feeding regimen provides 1264 kcal, 114 grams of protein, and 584 ml of H2O.  Tube feeding regimen and current propofol provides 1869 total kcal (102% of needs).  NUTRITION DIAGNOSIS:   Inadequate oral intake related to inability to eat as evidenced by NPO status.  GOAL:   Patient will meet greater than or equal to 90% of their needs  MONITOR:   Vent status, Labs, Weight trends, I & O's, TF tolerance  REASON FOR ASSESSMENT:   Ventilator, Consult Enteral/tube feeding initiation and management  ASSESSMENT:   73 year old male who presented to the ED on 5/18 with SOB. PMH of COPD, HTN, PTSD. Pt found to have Takotsubo cardiomyopathy/acute myocarditis, small LLL PE, and small left front lobe hemorrhage. Pt intubated in the ED. Pt was extubated to nasal cannula on 5/19. Pt reintubated later in the day on 5/19 for agitation and possible stridor.  RD consulted for TF initiation and management. Adult ICU TF Protocol ordered per MD.  OGT in place. Per x-ray on 5/20, side port in distal esophagus. RN advanced 5 cm per MD after x-ray.  Reviewed weight history in chart. Noted pt with progressive weight loss since 04/14/18. Pt with a total weight loss of 6.5 kg since that date. This is an 8.2% weight loss in 6 months which is not quite significant for timeframe.  Discussed order for Cortrak with MD. Informed MD that [place] team will not be available until Friday. MD asked that RD place verbal with readback order to have Cortrak placed under fluoro.  Patient is currently intubated on ventilator support MV: 11 L/min Temp (24hrs), Avg:98.6 F (37 C), Min:96.6 F (35.9 C), Max:99.9 F (37.7 C) BP: 117/83 MAP: 95  Propofol: 22.9 ml/hr (provides 605 kcal daily from lipid)  Medications reviewed  and include: Lasix 40 mg once, Protonix, Senna, IV abx  Labs reviewed. CBG's: 101, 100, 116, 105, 80, 71 x 24 hours  UOP: 1320 ml x 24 hours I/O's: +2.9 L since admit  NUTRITION - FOCUSED PHYSICAL EXAM:  Unable to complete at this time. RD working remotely.  Diet Order:   Diet Order            Diet NPO time specified  Diet effective now              EDUCATION NEEDS:   No education needs have been identified at this time  Skin:  Skin Assessment: Reviewed RN Assessment  Last BM:  no documented BM  Height:   Ht Readings from Last 1 Encounters:  08/31/18 6\' 3"  (1.905 m)    Weight:   Wt Readings from Last 1 Encounters:  09/02/18 72.8 kg    Ideal Body Weight:  89.1 kg  BMI:  Body mass index is 20.06 kg/m.  Estimated Nutritional Needs:   Kcal:  1823  Protein:  100-115 grams  Fluid:  >/= 1.8 L    Gaynell Face, MS, RD, LDN Inpatient Clinical Dietitian Pager: [phone] Weekend/After Hours: [phone]

## 2018-09-02 NOTE — Progress Notes (Signed)
PT Cancellation Note  Patient Details Name: Nycere Presley MRN: [id number] DOB: 06-Oct-1945   Cancelled Treatment:    Reason Eval/Treat Not Completed: Active bedrest order   Currently on vent, and with active bedrest order;   Will need incr activity orders to proceed with PT evaluation;   Roney Marion, [place] Pager [phone] Office [place] 09/02/2018, 9:05 AM

## 2018-09-02 NOTE — Telephone Encounter (Signed)
Called and spoke with pt's wife Jeremiah [name] who stated pt begin to have worsening breathing 1 week ago and Jeremiah [name] told pt that he should be evaluated to see if he needed O2. Jeremiah [name] stated that they had been told by someone but could not remember who that pt should possibly seek emergency care due to his breathing but at this time pt did not go to the ER to be further evaluated. Now, on 5/18, pt had to have EMS called due to breathing and was transported to hospital and is now at [facility] on a vent.  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

## 2018-09-02 NOTE — Progress Notes (Signed)
[place] Progress Note Patient Name: Jeremiah [name] DOB: 12-Sep-1945 MRN: [id number]   Date of Service  09/02/2018  HPI/Events of Note  Agitation - Not able to adequately sedate the patient with Propofol for MRI d/t hypotension. No CVL or CVP.   eICU Interventions  Will order: 1. Phenylephrine IV infusion, Titrate to RASS = 0 to -1.      Intervention Category Major Interventions: Delirium, psychosis, severe agitation - evaluation and management  Narcisa Ganesh Eugene 09/02/2018, 12:17 AM

## 2018-09-02 NOTE — Progress Notes (Signed)
Pt transported on ventilator from 2M09 to IR and back without complications.

## 2018-09-02 NOTE — Progress Notes (Signed)
NAME:  Jeremiah [name], MRN:  [id number], DOB:  1946-02-12, LOS: 2 ADMISSION DATE:  08/31/2018, CONSULTATION DATE:  08/31/18 REFERRING MD:  R[facility]our - [facility]  CHIEF COMPLAINT:  SOB   Brief History   Jeremiah [name] is a 72 y.o. male who presented to [facility] with SOB felt to be due to AECOPD.  Required intubation after failing NIMV.  Incidentally found to have small LLL PE and small 43mm left frontal lobe hemorrhage; therefore, transferred to [facility].  History of present illness   Pt is encephelopathic; therefore, this HPI is obtained from chart review. Jeremiah [name] is a 73 y.o. male who has a PMH as outlined below including Gold III COPD for which he is followed by Dr. Chase Caller (see "past medical history").  He presented to [facility] 5/18 with SOB.  He was placed on NIMV but failed and required intubation.  In ED, had significant bronchospasm, felt to be due to AECOPD. Per wife, SOB had began 1 week prior and progressively worsened.    CTA chest with incidental finding small LLL PE.  CT head with incidental finding small 65mm left frontal lobe hemorrhage.  Heparin deferred in lieu of this.  COVID negative.  In addition, EKG demonstrated J point elevation.  Discussed with cardiology who recommended trending troponins.  No emergent procedure indicated.  Pt transferred to [facility] for further evaluation and management.  Past Medical History  COPD, OSA, HTN, PTSD, Depression.  Significant Hospital Events   5/18 > admit.  Consults:  None.  Procedures:  ETT 5/18 >   Significant Diagnostic Tests:  CXR 5/18 > COPD. CT head 5/18 > 12mm subcortical hemorrhage in left frontal lobe.  Chronic white matter disease, question amyloid angiopathy. CTA chest 5/18 > single subsegmental PE in LLL.  Emphysema.  LE duplex 5/18 > negative for DVT. Echo 5/19 > EF 20-25%, stress cardiomopathy. MRI brain 5/20 > Acute 37mm hemorrhage, scattered small hemorrhages consistent with hypertension or amyloid angiopathy.  Micro Data:  Blood 5/18  > neg Sputum 5/18 > neg RVP 5/18 > neg SARS CoV2 5/18 > neg  Antimicrobials:  Azithromycin 5/18 x 1 Ceftriaxone 5/18 > 5/25  Interim history/subjective:  Re-intubated last pm for agitation and possible stridor not responsive to racemic epinephrine.  Objective:  Blood pressure (!) 116/94, pulse 74, temperature (!) 96.6 F (35.9 C), resp. rate 17, height 6\' 3"  (1.905 m), weight 72.8 kg, SpO2 100 %.    Vent Mode: PRVC FiO2 (%):  [50 %-100 %] 50 % Set Rate:  [12 bmp] 12 bmp Vt Set:  [670 mL] 670 mL PEEP:  [5 cmH20] 5 cmH20 Plateau Pressure:  [13 cmH20-17 cmH20] 13 cmH20   Intake/Output Summary (Last 24 hours) at 09/02/2018 0827 Last data filed at 09/02/2018 0800 Gross per 24 hour  Intake 1465.34 ml  Output 1445 ml  Net 20.34 ml   Filed Weights   08/31/18 0439 09/01/18 0500 09/02/18 0500  Weight: 76.2 kg 76.3 kg 72.8 kg    Examination:  General: Adult male, in NAD.  Shivering. Neuro: Sedated on propofol. PERL, disconjugate gaze. Winces to pain.  HEENT: [place]/AT. Sclerae anicteric.  ETT in place. OGT in place. Cardiovascular: RRR, no M/R/G.  Lungs: Respirations even and unlabored.  CTA bilaterally, No W/R/R. No dyssynchrony. Abdomen: BS x 4, soft, NT/ND.  Musculoskeletal: No gross deformities, no edema.  Skin: Intact, warm, no rashes.  Assessment & Plan:   Critically ill due to acute respiratory failure requiring mechanical ventilation. Decompensation due to possible stridor  and agitation leading to increasing work of breathing - Continue full ventilatory support today. - Dexamethasone for stridor - Initiate enteral sedation to prevent post-extubation agitation on next extubation  - home alprazolam and trazodone  - Seroquel qhs - SBT tomorrow.  AECOPD - required intubation after failing NIMV. - Continue current bronchodilators - Stop ceftriaxone at 7 days.  Stress cardiomyopathy with EF 25 %. Optimized heart failure will help extubation. -Introduce GDHFT Bidil tid  - Diuretic trial  - If BP allows, add beta-blocker.  Small LLL PE. - Assess LE duplex, echo. - Defer heparin given small left frontal lobe hemorrhage.  Small left frontal lobe hemorrhage. Hypertension related. - Repeat imaging today to assess expansion. - If stable can consider restarting anti-platelet regimen.  Hx HTN, HLD. - Hold preadmission amlodipine, ASA. - Continue preadmission pravastatin.  Best Practice:  Diet: NPO. Restart tube feeds. Moderate nutritional risk. Pain/Anxiety/Delirium protocol (if indicated): Propofol gtt / Fentanyl PRN.  RASS goal 0 to -1. VAP protocol (if indicated): In place DVT prophylaxis: SCD's. GI prophylaxis: PPI. Glucose control: SSI. Mobility: Bedrest. Code Status: Full. Family Communication: Brother in Sports coach updated 5/18 and 5/19.  He is a physician and is relaying information to pt's spouse. Disposition: ICU.  CRITICAL CARE Performed by: Kipp Brood   Total critical care time: 40 minutes  Critical care time was exclusive of separately billable procedures and treating other patients.  Critical care was necessary to treat or prevent imminent or life-threatening deterioration.  Critical care was time spent personally by me on the following activities: development of treatment plan with patient and/or surrogate as well as nursing, discussions with consultants, evaluation of patient's response to treatment, examination of patient, obtaining history from patient or surrogate, ordering and performing treatments and interventions, ordering and review of laboratory studies, ordering and review of radiographic studies, pulse oximetry, re-evaluation of patient's condition and participation in multidisciplinary rounds.  Kipp Brood, MD [facility] ICU Physician [place]  Pager: [phone] Mobile: [phone] After hours: [phone].   09/02/2018, 8:27 AM

## 2018-09-02 NOTE — Progress Notes (Signed)
OT Cancellation Note  Patient Details Name: Jeremiah [name] MRN: [id number] DOB: 07-05-1945   Cancelled Treatment:    Reason Eval/Treat Not Completed: Medical issues which prohibited therapy(on vent, bedrest order and leaving unit for imaging.)   Darryl Nestle) Marsa Aris OTR/L Acute Rehabilitation Services Pager: [phone] Office: [place] 09/02/2018, 9:17 AM

## 2018-09-02 NOTE — Progress Notes (Signed)
[place] Progress Note Patient Name: Jeremiah [name] DOB: [birth year] MRN: [id number]   Date of Service  09/02/2018  HPI/Events of Note  Agitation - Request to renew bilateral soft wrist and waist belt restraints.  eICU Interventions  Will renew  bilateral soft wrist and waist belt restraints for 12 hours.      Intervention Category Major Interventions: Delirium, psychosis, severe agitation - evaluation and management  Baylen Dea Eugene 09/02/2018, 8:19 PM

## 2018-09-02 NOTE — Progress Notes (Signed)
Progress Note  Patient Name: Jeremiah [name] Date of Encounter: 09/02/2018  Primary Cardiologist: Sanda Klein, MD   Subjective   Reintubated yesterday evening. Agitated, could not get benefit of NIMV and had some airway spasm. Excellent oxygenation immediately after intubation (pO2 499 suggests this was not due to pulmonary edema. Evolving pattern on ECG with resolution of diffuse ST elevation, now with widespread T wave inversion and mildly prolonged QT. No Q waves have developed.  Mildly elevated troponin persists, slow decline.  Inpatient Medications    Scheduled Meds:   stroke: mapping our early stages of recovery book   Does not apply Once   ALPRAZolam  0.5 mg Per Tube BID   chlorhexidine gluconate (MEDLINE KIT)  15 mL Mouth Rinse BID   Chlorhexidine Gluconate Cloth  6 each Topical Daily   dexamethasone  6 mg Intravenous Q6H   feeding supplement (PRO-STAT SUGAR FREE 64)  60 mL Per Tube BID   fentaNYL       furosemide  40 mg Intravenous Once   ipratropium-albuterol  3 mL Nebulization Q6H   isosorbide-hydrALAZINE  0.5 tablet Oral TID   mouth rinse  15 mL Mouth Rinse 10 times per day   pantoprazole (PROTONIX) IV  40 mg Intravenous QHS   pravastatin  10 mg Per Tube q1800   QUEtiapine  25 mg Per Tube QHS   senna-docusate  1 tablet Oral BID   traZODone  50 mg Oral QHS   Continuous Infusions:  sodium chloride 10 mL/hr at 09/01/18 1621   cefTRIAXone (ROCEPHIN)  IV 200 mL/hr at 09/02/18 0800   dextrose 5 % and 0.9% NaCl Stopped (09/02/18 0756)   feeding supplement (VITAL AF 1.2 CAL)     propofol (DIPRIVAN) infusion 50 mcg/kg/min (09/02/18 0800)   PRN Meds: acetaminophen **OR** acetaminophen (TYLENOL) oral liquid 160 mg/5 mL **OR** acetaminophen, albuterol, fentaNYL (SUBLIMAZE) injection, haloperidol lactate   Vital Signs    Vitals:   09/02/18 0730 09/02/18 0745 09/02/18 0800 09/02/18 0815  BP: 103/68 103/68 114/73 (!) 116/94  Pulse: (!) 50 64 (!)  56 74  Resp: '12 16 12 17  '$ Temp:      TempSrc:      SpO2: 100% 100% 100% 100%  Weight:      Height:        Intake/Output Summary (Last 24 hours) at 09/02/2018 1033 Last data filed at 09/02/2018 0800 Gross per 24 hour  Intake 1465.34 ml  Output 1445 ml  Net 20.34 ml   Last 3 Weights 09/02/2018 09/01/2018 08/31/2018  Weight (lbs) 160 lb 7.9 oz 168 lb 3.4 oz 167 lb 15.9 oz  Weight (kg) 72.8 kg 76.3 kg 76.2 kg      Telemetry    NSR - Personally Reviewed  ECG    Not performed today - Personally Reviewed  Physical Exam  Intubated, sedated GEN: No acute distress.   Neck: No JVD Cardiac: RRR, no murmurs, rubs, or gallops.  Respiratory: Prolonged expiration, faint diffuse wheezing GI: Soft, nontender, non-distended  MS: No edema; No deformity. Neuro:  N/a Psych: N/a   Labs    Chemistry Recent Labs  Lab 08/31/18 0458 09/01/18 0253 09/01/18 0340 09/01/18 2134 09/02/18 0431  NA 138 139 139 142 143  K 3.8 4.6 4.0 4.0 4.8  CL 103 104  --   --  110  CO2 22 21*  --   --  19*  GLUCOSE 200* 124*  --   --  104*  BUN 17 17  --   --  19  CREATININE 1.22 1.39*  --   --  1.01  CALCIUM 10.0 9.4  --   --  9.2  PROT 8.3*  --   --   --   --   ALBUMIN 4.6  --   --   --   --   AST 34  --   --   --   --   ALT 14  --   --   --   --   ALKPHOS 85  --   --   --   --   BILITOT 1.0  --   --   --   --   GFRNONAA 60* 51*  --   --  >60  GFRAA >60 59*  --   --  >60  ANIONGAP 13 14  --   --  14     Hematology Recent Labs  Lab 08/31/18 0458 09/01/18 0253 09/01/18 0340 09/01/18 2134 09/02/18 0431  WBC 13.1* 12.3*  --   --  13.8*  RBC 5.83* 5.38  --   --  5.24  HGB 17.9* 16.6 16.0 14.3 16.0  HCT 54.4* 50.0 47.0 42.0 49.9  MCV 93.3 92.9  --   --  95.2  MCH 30.7 30.9  --   --  30.5  MCHC 32.9 33.2  --   --  32.1  RDW 12.6 12.6  --   --  12.7  PLT 262 201  --   --  156    Cardiac Enzymes Recent Labs  Lab 08/31/18 0458 08/31/18 0952 08/31/18 1536 09/01/18 1439  TROPONINI  0.04* 0.79* 1.91* 1.29*   No results for input(s): TROPIPOC in the last 168 hours.   BNP Recent Labs  Lab 08/31/18 0459  BNP 285.0*     DDimer No results for input(s): DDIMER in the last 168 hours.   Radiology    Mr Virgel Paling BS Contrast  Result Date: 09/02/2018 CLINICAL DATA:  73 y/o  M; left frontal lobe cerebral hemorrhage. EXAM: MRI HEAD WITHOUT CONTRAST MRA HEAD WITHOUT CONTRAST TECHNIQUE: Multiplanar, multiecho pulse sequences of the brain and surrounding structures were obtained without intravenous contrast. Angiographic images of the head were obtained using MRA technique without contrast. COMPARISON:  08/31/2018 CT of the head. FINDINGS: MRI HEAD FINDINGS Brain: 5 mm focus of acute hemorrhage within the left frontal lobe is associated with a small linear focus of subcortical reduced diffusion compatible with acute/early subacute infarction (series 5, image 90). Additional punctate foci of reduced diffusion are present in the left anterior frontal lobe and the left temporal lobe without associated hemorrhage. Large confluent nonspecific T2 FLAIR hyperintensities in subcortical and periventricular white matter are compatible with advanced chronic microvascular ischemic changes. Mild-to-moderate volume loss of the brain. Small chronic infarct is present within the left cerebellar hemisphere. There are numerous small foci of susceptibility hypointensity throughout the brain compatible with hemosiderin deposition of microhemorrhage. Foci of hemorrhage are in a predominantly peripheral distribution. Additionally, there are areas of superficial siderosis over the left-greater-than-right cerebral convexities from prior subarachnoid hemorrhage. Vascular: As below. Skull and upper cervical spine: Normal marrow signal. Sinuses/Orbits: Negative. Other: None. MRA HEAD FINDINGS Anterior circulation: No large vessel occlusion, aneurysm, or significant stenosis is identified. Posterior circulation: No large  vessel occlusion, aneurysm, or significant stenosis is identified. Anatomic variation: Large left A1, small right A1, large anterior communicating artery, normal variant. Fetal right PCA. IMPRESSION: MRI head: 1. 5 mm focus of acute hemorrhage in the left frontal  lobe is associated with a small subcortical acute/early subacute infarction. Additional punctate foci of acute/early subacute infarction are present in the left anterior frontal lobe and the left temporal lobe. 2. Numerous foci of chronic hemorrhage throughout the brain in a predominant peripheral distribution as well as superficial siderosis over the left-greater-than-right cerebral convexities. The peripheral distribution favors amyloid angiopathy over chronic sequelae of hypertension. 3. Severe chronic microvascular ischemic changes and mild to moderate volume loss of the brain. MRA head: Patent anterior and posterior intracranial circulation. No large vessel occlusion, aneurysm, or significant stenosis is identified. Electronically Signed   By: Kristine Garbe M.D.   On: 09/02/2018 02:28   Mr Brain Wo Contrast  Result Date: 09/02/2018 CLINICAL DATA:  73 y/o  M; left frontal lobe cerebral hemorrhage. EXAM: MRI HEAD WITHOUT CONTRAST MRA HEAD WITHOUT CONTRAST TECHNIQUE: Multiplanar, multiecho pulse sequences of the brain and surrounding structures were obtained without intravenous contrast. Angiographic images of the head were obtained using MRA technique without contrast. COMPARISON:  08/31/2018 CT of the head. FINDINGS: MRI HEAD FINDINGS Brain: 5 mm focus of acute hemorrhage within the left frontal lobe is associated with a small linear focus of subcortical reduced diffusion compatible with acute/early subacute infarction (series 5, image 90). Additional punctate foci of reduced diffusion are present in the left anterior frontal lobe and the left temporal lobe without associated hemorrhage. Large confluent nonspecific T2 FLAIR  hyperintensities in subcortical and periventricular white matter are compatible with advanced chronic microvascular ischemic changes. Mild-to-moderate volume loss of the brain. Small chronic infarct is present within the left cerebellar hemisphere. There are numerous small foci of susceptibility hypointensity throughout the brain compatible with hemosiderin deposition of microhemorrhage. Foci of hemorrhage are in a predominantly peripheral distribution. Additionally, there are areas of superficial siderosis over the left-greater-than-right cerebral convexities from prior subarachnoid hemorrhage. Vascular: As below. Skull and upper cervical spine: Normal marrow signal. Sinuses/Orbits: Negative. Other: None. MRA HEAD FINDINGS Anterior circulation: No large vessel occlusion, aneurysm, or significant stenosis is identified. Posterior circulation: No large vessel occlusion, aneurysm, or significant stenosis is identified. Anatomic variation: Large left A1, small right A1, large anterior communicating artery, normal variant. Fetal right PCA. IMPRESSION: MRI head: 1. 5 mm focus of acute hemorrhage in the left frontal lobe is associated with a small subcortical acute/early subacute infarction. Additional punctate foci of acute/early subacute infarction are present in the left anterior frontal lobe and the left temporal lobe. 2. Numerous foci of chronic hemorrhage throughout the brain in a predominant peripheral distribution as well as superficial siderosis over the left-greater-than-right cerebral convexities. The peripheral distribution favors amyloid angiopathy over chronic sequelae of hypertension. 3. Severe chronic microvascular ischemic changes and mild to moderate volume loss of the brain. MRA head: Patent anterior and posterior intracranial circulation. No large vessel occlusion, aneurysm, or significant stenosis is identified. Electronically Signed   By: Kristine Garbe M.D.   On: 09/02/2018 02:28   Dg  Chest Port 1 View  Result Date: 09/01/2018 CLINICAL DATA:  73 year old male intubated. Respiratory failure. Negative for COVID-19 yesterday. EXAM: PORTABLE CHEST 1 VIEW COMPARISON:  0518 hours today and earlier. FINDINGS: Portable AP semi upright views at 2030 hours. ETT tip in good position between the level the clavicles and carina. Enteric tube tip is at the level of the gastric cardia, the side hole is at the distal esophagus level on image 2. Stable lung volumes and mediastinal contours. Allowing for portable technique the lungs are clear. Negative visible bowel gas pattern. No  acute osseous abnormality identified. IMPRESSION: 1. ETT in good position. Enteric tube side hole in the distal esophagus, advance 6 cm to ensure side hole placement within the stomach. 2. No acute cardiopulmonary abnormality. Electronically Signed   By: Genevie Ann M.D.   On: 09/01/2018 20:47   Dg Chest Port 1 View  Result Date: 09/01/2018 CLINICAL DATA:  Respiratory failure EXAM: PORTABLE CHEST 1 VIEW COMPARISON:  08/31/2018 FINDINGS: Endotracheal tube and gastric catheter are noted in satisfactory position. The lungs are hyperinflated consistent with COPD. No focal infiltrate or sizable effusion is noted. No bony abnormality is seen. IMPRESSION: Tubes and lines as described. Emphysematous change without acute abnormality. Electronically Signed   By: Inez Catalina M.D.   On: 09/01/2018 08:13   Vas [place] Lower Extremity Venous (dvt)  Result Date: 08/31/2018  Lower Venous Study Indications: Pulmonary embolism.  Performing Technologist: Abram Sander RVS  Examination Guidelines: A complete evaluation includes B-mode imaging, spectral Doppler, color Doppler, and power Doppler as needed of all accessible portions of each vessel. Bilateral testing is considered an integral part of a complete examination. Limited examinations for reoccurring indications may be performed as noted.   +---------+---------------+---------+-----------+----------+-------+  RIGHT     Compressibility Phasicity Spontaneity Properties Summary  +---------+---------------+---------+-----------+----------+-------+  CFV       Full            Yes       Yes                             +---------+---------------+---------+-----------+----------+-------+  SFJ       Full                                                      +---------+---------------+---------+-----------+----------+-------+  FV Prox   Full                                                      +---------+---------------+---------+-----------+----------+-------+  FV Mid    Full                                                      +---------+---------------+---------+-----------+----------+-------+  FV Distal Full                                                      +---------+---------------+---------+-----------+----------+-------+  PFV       Full                                                      +---------+---------------+---------+-----------+----------+-------+  POP       Full            Yes  Yes                             +---------+---------------+---------+-----------+----------+-------+  PTV       Full                                                      +---------+---------------+---------+-----------+----------+-------+  PERO      Full                                                      +---------+---------------+---------+-----------+----------+-------+   +---------+---------------+---------+-----------+----------+-------+  LEFT      Compressibility Phasicity Spontaneity Properties Summary  +---------+---------------+---------+-----------+----------+-------+  CFV       Full            Yes       Yes                             +---------+---------------+---------+-----------+----------+-------+  SFJ       Full                                                      +---------+---------------+---------+-----------+----------+-------+  FV Prox   Full                                                       +---------+---------------+---------+-----------+----------+-------+  FV Mid    Full                                                      +---------+---------------+---------+-----------+----------+-------+  FV Distal Full                                                      +---------+---------------+---------+-----------+----------+-------+  PFV       Full                                                      +---------+---------------+---------+-----------+----------+-------+  POP       Full            Yes       Yes                             +---------+---------------+---------+-----------+----------+-------+  PTV  Full                                                      +---------+---------------+---------+-----------+----------+-------+  PERO      Full                                                      +---------+---------------+---------+-----------+----------+-------+     Summary: Right: There is no evidence of deep vein thrombosis in the lower extremity. No cystic structure found in the popliteal fossa. Left: There is no evidence of deep vein thrombosis in the lower extremity. No cystic structure found in the popliteal fossa.  *See table(s) above for measurements and observations. Electronically signed by Monica Martinez MD on 08/31/2018 at 6:10:55 PM.    Final     Cardiac Studies   ECHO 08/31/2018  1. The left ventricle has severely reduced systolic function, with an ejection fraction of 20-25%. The cavity size was normal. There is mildly increased left ventricular wall thickness. Findings are consistent with Takatsubo cardiomyopathy or  multivessel CAD. Left ventricular diastolic Doppler parameters are consistent with pseudonormalization. Elevated left atrial and left ventricular end-diastolic pressures The E/e' is >15. Left ventricular diffuse hypokinesis. 2. The right ventricle has moderately reduced systolic function. The cavity  was normal. There is mildly increased right ventricular wall thickness. 3. The mitral valve is abnormal. Mild thickening of the mitral valve leaflet. There is mild mitral annular calcification present. 4. The tricuspid valve is grossly normal. 5. The aortic valve appears bicuspid. Mild sclerosis of the aortic valve. Aortic valve regurgitation is trivial by color flow Doppler. No stenosis of the aortic valve. 6. The inferior vena cava was dilated in size with <50% respiratory variability.  Patient Profile     73 y.o. male with a hx of HTN, COPD and OSAwho is being seen today for the evaluation of new LV systolic dysfunction, abnormal ECG and elevated troponin after intubation for COPD exacerbation and incidentally found to have a small subsegmental PE and intracranial bleed.   Assessment & Plan    1. CHF: Echo showed severely reduced LVEF at 20-25%. Differential diagnosis includes CAD/coronary ischemia, Takotsubo/ Stressed Induced CM and acute myocarditis. Once more stable, will proceed with cardiac catheterization to assess for CAD. Evolving ECG changes look more like resolving myopericarditis, but cannot exclude global ischemia. Will give some diuretics and low dose ARB.  2. Abnormal Troponin: 0.04>>0.79>>1.91>>1.29. Unclear etiology. EF 20-25%. Will ultimately need ischemic w/u to exclude obstructive CAD once medically stable. No heparin currently due to May.   3. AKI: resolved. SCr back WNL at 1.01 today.   4. COPDE/ Acute Hypoxic Respiratory Failure: required reintubation yesterday. Vent management per PCCM.   5. Acute Segmental PE: Chest CT showed very small LLL segmental PE. LE dopplers negative for DVT. Not currently on anticoagulation due to small ICM.   4. ICH:  Neurology following.   For questions or updates, please contact [place] Please consult [email or web address] for contact info under        Sanda Klein, MD, [facility] [phone] office [phone]  pager

## 2018-09-02 NOTE — Progress Notes (Signed)
RT called to room to assess patient, patient had audible stridor. Patient tachypneic, labored and using accessory muscles    NP Georgann Housekeeper and MD Aurea Graff entered the room and accessed patient.  Patient given Racemic Epinephrine  Emergently due to Stridor

## 2018-09-02 NOTE — Progress Notes (Signed)
STROKE TEAM PROGRESS NOTE   SUBJECTIVE (INTERVAL HISTORY) His RN is at the bedside. Pt had agitation and not able to protect airway last night and was re-intubated.  Currently sedated with propofol.  MRI showed possible CAA with numerous micro bleeds and siderosis as well as several punctate infarcts.  Not on anticoagulation at this time.   OBJECTIVE Temp:  [96.6 F (35.9 C)-99.9 F (37.7 C)] 99.1 F (37.3 C) (05/20 1700) Pulse Rate:  [50-107] 89 (05/20 1612) Cardiac Rhythm: Normal sinus rhythm (05/20 1600) Resp:  [12-34] 16 (05/20 1700) BP: (82-146)/(60-105) 116/77 (05/20 1700) SpO2:  [91 %-100 %] 100 % (05/20 1612) FiO2 (%):  [40 %-100 %] 40 % (05/20 1612) Weight:  [72.8 kg] 72.8 kg (05/20 0500)  Recent Labs  Lab 09/02/18 0402 09/02/18 0738 09/02/18 1130 09/02/18 1422 09/02/18 1458  GLUCAP 100* 101* 65* 51* 71   Recent Labs  Lab 08/31/18 0458 09/01/18 0253 09/01/18 0340 09/01/18 2134 09/02/18 0431 09/02/18 1009  NA 138 139 139 142 143  --   K 3.8 4.6 4.0 4.0 4.8  --   CL 103 104  --   --  110  --   CO2 22 21*  --   --  19*  --   GLUCOSE 200* 124*  --   --  104*  --   BUN 17 17  --   --  19  --   CREATININE 1.22 1.39*  --   --  1.01  --   CALCIUM 10.0 9.4  --   --  9.2  --   MG  --  2.2  --   --  2.3 2.3  PHOS  --  3.0  --   --  3.3 3.3   Recent Labs  Lab 08/31/18 0458  AST 34  ALT 14  ALKPHOS 85  BILITOT 1.0  PROT 8.3*  ALBUMIN 4.6   Recent Labs  Lab 08/31/18 0458 09/01/18 0253 09/01/18 0340 09/01/18 2134 09/02/18 0431  WBC 13.1* 12.3*  --   --  13.8*  NEUTROABS 9.6*  --   --   --   --   HGB 17.9* 16.6 16.0 14.3 16.0  HCT 54.4* 50.0 47.0 42.0 49.9  MCV 93.3 92.9  --   --  95.2  PLT 262 201  --   --  156   Recent Labs  Lab 08/31/18 0458 08/31/18 0952 08/31/18 1536 09/01/18 1439  TROPONINI 0.04* 0.79* 1.91* 1.29*   No results for input(s): LABPROT, INR in the last 72 hours. Recent Labs    08/31/18 0444  COLORURINE YELLOW  LABSPEC  1.030  PHURINE 5.0  GLUCOSEU 150*  HGBUR NEGATIVE  BILIRUBINUR NEGATIVE  KETONESUR 5*  PROTEINUR 100*  NITRITE NEGATIVE  LEUKOCYTESUR NEGATIVE       Component Value Date/Time   CHOL 159 08/31/2018 1830   TRIG 48 08/31/2018 1830   HDL 66 08/31/2018 1830   CHOLHDL 2.4 08/31/2018 1830   VLDL 10 08/31/2018 1830   LDLCALC 83 08/31/2018 1830   Lab Results  Component Value Date   HGBA1C 5.6 08/31/2018      Component Value Date/Time   LABOPIA NONE DETECTED 08/31/2018 2049   COCAINSCRNUR NONE DETECTED 08/31/2018 2049   LABBENZ POSITIVE (A) 08/31/2018 2049   AMPHETMU NONE DETECTED 08/31/2018 2049   THCU NONE DETECTED 08/31/2018 2049   LABBARB NONE DETECTED 08/31/2018 2049    No results for input(s): ETH in the last 168 hours.  I have personally  reviewed the radiological images below and agree with the radiology interpretations.  Dg Abd 1 View  Result Date: 09/02/2018 CLINICAL DATA:  Enteric tube placement EXAM: ABDOMEN - 1 VIEW COMPARISON:  None. FINDINGS: An NJ tube was placed using fluoroscopic guidance. The total fluoroscopy time was 3 minutes and 30 seconds. 20 mL of Omnipaque 300 was utilized for this examination. A 10 French NJ tube was placed. Injection of contrast demonstrates opacification of the jejunum. A second enteric tube projects over the gastric body. IMPRESSION: NJ tube placement as above.  The tip terminates in the jejunum. Electronically Signed   By: Constance Holster M.D.   On: 09/02/2018 13:44   Ct Head Wo Contrast  Result Date: 09/02/2018 CLINICAL DATA:  Follow-up cerebral hemorrhage. EXAM: CT HEAD WITHOUT CONTRAST TECHNIQUE: Contiguous axial images were obtained from the base of the skull through the vertex without intravenous contrast. COMPARISON:  Head CT 08/31/2018 and MRI 09/02/2018 FINDINGS: Brain: A 5 mm focus of subcortical hemorrhage in the posterior left frontal lobe is unchanged in size from the prior CT and is without associated edema. Focal linear  hyperattenuation in a left parietal sulcus is unchanged from the prior CT and may represent a vessel versus trace subarachnoid hemorrhage. There is no new intracranial hemorrhage elsewhere. Punctate acute infarcts in the left frontal and left temporal lobes on today's MRI are not well demonstrated by CT. Confluent cerebral white matter hypodensities are unchanged and compatible with severe chronic small vessel ischemic disease. Mild cerebral atrophy is noted. There is no midline shift or extra-axial fluid collection. Vascular: Calcified atherosclerosis at the skull base. Skull: No fracture or focal osseous lesion. Sinuses/Orbits: Minimal scattered mucosal thickening in the paranasal sinuses. Clear mastoid air cells. Unremarkable orbits. Other: None. IMPRESSION: 1. Unchanged small hemorrhage in the posterior left frontal lobe. 2. Unchanged focal density in a left parietal sulcus which may be vascular or indicative of trace subarachnoid hemorrhage. 3. No new intracranial hemorrhage. 4. Severe chronic small vessel ischemic disease. Electronically Signed   By: Logan Bores M.D.   On: 09/02/2018 12:40   Ct Head Wo Contrast  Result Date: 08/31/2018 CLINICAL DATA:  Altered level of consciousness, unexplained EXAM: CT HEAD WITHOUT CONTRAST TECHNIQUE: Contiguous axial images were obtained from the base of the skull through the vertex without intravenous contrast. COMPARISON:  None similar FINDINGS: Brain: 5 mm ovoid high-density focus in the subcortical high left frontal region. Background of confluent low-density in the bilateral cerebral white matter. Linear high-density within a left parietal sulcus which has the appearance of a vessel on sagittal reformats. No evidence of acute gray matter infarct. No masslike finding or hydrocephalus. Vascular: No hyperdense vessel noted. Skull: Negative Sinuses/Orbits: The sinuses are essentially clear.  Negative orbits. Critical Value/emergent results were called by telephone at  the time of interpretation on 08/31/2018 at 7:06 am to Dr. Ezequiel Essex , who verbally acknowledged these results. IMPRESSION: 1. 5 mm acute subcortical hemorrhage in the left frontal lobe. Extensive chronic white matter disease, question amyloid angiopathy given this location. 2. Attention to a minimally high-dense left parietal sulcus on follow-up. Electronically Signed   By: Monte Fantasia M.D.   On: 08/31/2018 07:06   Ct Angio Chest Pe W And/or Wo Contrast  Result Date: 08/31/2018 CLINICAL DATA:  Altered level of consciousness.  Elevated D-dimer EXAM: CT ANGIOGRAPHY CHEST WITH CONTRAST TECHNIQUE: Multidetector CT imaging of the chest was performed using the standard protocol during bolus administration of intravenous contrast. Multiplanar CT image reconstructions  and MIPs were obtained to evaluate the vascular anatomy. CONTRAST:  136mL OMNIPAQUE IOHEXOL 350 MG/ML SOLN COMPARISON:  02/20/2016 FINDINGS: Cardiovascular: Normal heart size. No pericardial effusion. Coronary calcification is present. There is no opacification of the aorta due to contrast timing. A single subsegmental pulmonary embolism is seen in the left lower lobe. Proximally the filling defect is eccentric, but there does appear to be branching into the downstream vessel and this is presumably acute in this setting. No large or second pulmonary embolism is seen. There is motion artifact at the bases. Pulmonary arteries are not enlarged and there is no right ventricular dilatation in this patient with elevated troponin Mediastinum/Nodes: Negative for adenopathy. Lungs/Pleura: Centrilobular emphysema. Patchy airway debris without collapse. There is no edema, consolidation, effusion, or pneumothorax. Endotracheal tube in good position. Upper Abdomen: Orogastric tube in good position. Musculoskeletal: No acute or aggressive finding Critical Value/emergent results were called by telephone at the time of interpretation on 08/31/2018 at 7:04 am to  Dr. Ezequiel Essex , who verbally acknowledged these results. Review of the MIP images confirms the above findings. IMPRESSION: 1. Single subsegmental pulmonary embolism to the left lower lobe. 2. Coronary atherosclerosis. 3. Emphysema. Electronically Signed   By: Monte Fantasia M.D.   On: 08/31/2018 07:11   Mr Virgel Paling BH Contrast  Result Date: 09/02/2018 CLINICAL DATA:  73 y/o  M; left frontal lobe cerebral hemorrhage. EXAM: MRI HEAD WITHOUT CONTRAST MRA HEAD WITHOUT CONTRAST TECHNIQUE: Multiplanar, multiecho pulse sequences of the brain and surrounding structures were obtained without intravenous contrast. Angiographic images of the head were obtained using MRA technique without contrast. COMPARISON:  08/31/2018 CT of the head. FINDINGS: MRI HEAD FINDINGS Brain: 5 mm focus of acute hemorrhage within the left frontal lobe is associated with a small linear focus of subcortical reduced diffusion compatible with acute/early subacute infarction (series 5, image 90). Additional punctate foci of reduced diffusion are present in the left anterior frontal lobe and the left temporal lobe without associated hemorrhage. Large confluent nonspecific T2 FLAIR hyperintensities in subcortical and periventricular white matter are compatible with advanced chronic microvascular ischemic changes. Mild-to-moderate volume loss of the brain. Small chronic infarct is present within the left cerebellar hemisphere. There are numerous small foci of susceptibility hypointensity throughout the brain compatible with hemosiderin deposition of microhemorrhage. Foci of hemorrhage are in a predominantly peripheral distribution. Additionally, there are areas of superficial siderosis over the left-greater-than-right cerebral convexities from prior subarachnoid hemorrhage. Vascular: As below. Skull and upper cervical spine: Normal marrow signal. Sinuses/Orbits: Negative. Other: None. MRA HEAD FINDINGS Anterior circulation: No large vessel  occlusion, aneurysm, or significant stenosis is identified. Posterior circulation: No large vessel occlusion, aneurysm, or significant stenosis is identified. Anatomic variation: Large left A1, small right A1, large anterior communicating artery, normal variant. Fetal right PCA. IMPRESSION: MRI head: 1. 5 mm focus of acute hemorrhage in the left frontal lobe is associated with a small subcortical acute/early subacute infarction. Additional punctate foci of acute/early subacute infarction are present in the left anterior frontal lobe and the left temporal lobe. 2. Numerous foci of chronic hemorrhage throughout the brain in a predominant peripheral distribution as well as superficial siderosis over the left-greater-than-right cerebral convexities. The peripheral distribution favors amyloid angiopathy over chronic sequelae of hypertension. 3. Severe chronic microvascular ischemic changes and mild to moderate volume loss of the brain. MRA head: Patent anterior and posterior intracranial circulation. No large vessel occlusion, aneurysm, or significant stenosis is identified. Electronically Signed   By: Mia Creek  [place]urus[place]-Stratton M.D.   On: 09/02/2018 02:28   Mr Brain Wo Contrast  Result Date: 09/02/2018 CLINICAL DATA:  73 y/o  M; left frontal lobe cerebral hemorrhage. EXAM: MRI HEAD WITHOUT CONTRAST MRA HEAD WITHOUT CONTRAST TECHNIQUE: Multiplanar, multiecho pulse sequences of the brain and surrounding structures were obtained without intravenous contrast. Angiographic images of the head were obtained using MRA technique without contrast. COMPARISON:  08/31/2018 CT of the head. FINDINGS: MRI HEAD FINDINGS Brain: 5 mm focus of acute hemorrhage within the left frontal lobe is associated with a small linear focus of subcortical reduced diffusion compatible with acute/early subacute infarction (series 5, image 90). Additional punctate foci of reduced diffusion are present in the left anterior frontal lobe and the left  temporal lobe without associated hemorrhage. Large confluent nonspecific T2 FLAIR hyperintensities in subcortical and periventricular white matter are compatible with advanced chronic microvascular ischemic changes. Mild-to-moderate volume loss of the brain. Small chronic infarct is present within the left cerebellar hemisphere. There are numerous small foci of susceptibility hypointensity throughout the brain compatible with hemosiderin deposition of microhemorrhage. Foci of hemorrhage are in a predominantly peripheral distribution. Additionally, there are areas of superficial siderosis over the left-greater-than-right cerebral convexities from prior subarachnoid hemorrhage. Vascular: As below. Skull and upper cervical spine: Normal marrow signal. Sinuses/Orbits: Negative. Other: None. MRA HEAD FINDINGS Anterior circulation: No large vessel occlusion, aneurysm, or significant stenosis is identified. Posterior circulation: No large vessel occlusion, aneurysm, or significant stenosis is identified. Anatomic variation: Large left A1, small right A1, large anterior communicating artery, normal variant. Fetal right PCA. IMPRESSION: MRI head: 1. 5 mm focus of acute hemorrhage in the left frontal lobe is associated with a small subcortical acute/early subacute infarction. Additional punctate foci of acute/early subacute infarction are present in the left anterior frontal lobe and the left temporal lobe. 2. Numerous foci of chronic hemorrhage throughout the brain in a predominant peripheral distribution as well as superficial siderosis over the left-greater-than-right cerebral convexities. The peripheral distribution favors amyloid angiopathy over chronic sequelae of hypertension. 3. Severe chronic microvascular ischemic changes and mild to moderate volume loss of the brain. MRA head: Patent anterior and posterior intracranial circulation. No large vessel occlusion, aneurysm, or significant stenosis is identified.  Electronically Signed   By: Kristine Garbe M.D.   On: 09/02/2018 02:28   Dg Chest Port 1 View  Result Date: 09/01/2018 CLINICAL DATA:  73 year old male intubated. Respiratory failure. Negative for COVID-19 yesterday. EXAM: PORTABLE CHEST 1 VIEW COMPARISON:  0518 hours today and earlier. FINDINGS: Portable AP semi upright views at 2030 hours. ETT tip in good position between the level the clavicles and carina. Enteric tube tip is at the level of the gastric cardia, the side hole is at the distal esophagus level on image 2. Stable lung volumes and mediastinal contours. Allowing for portable technique the lungs are clear. Negative visible bowel gas pattern. No acute osseous abnormality identified. IMPRESSION: 1. ETT in good position. Enteric tube side hole in the distal esophagus, advance 6 cm to ensure side hole placement within the stomach. 2. No acute cardiopulmonary abnormality. Electronically Signed   By: Genevie Ann M.D.   On: 09/01/2018 20:47   Dg Chest Port 1 View  Result Date: 09/01/2018 CLINICAL DATA:  Respiratory failure EXAM: PORTABLE CHEST 1 VIEW COMPARISON:  08/31/2018 FINDINGS: Endotracheal tube and gastric catheter are noted in satisfactory position. The lungs are hyperinflated consistent with COPD. No focal infiltrate or sizable effusion is noted. No bony abnormality is seen. IMPRESSION: Tubes  and lines as described. Emphysematous change without acute abnormality. Electronically Signed   By: Inez Catalina M.D.   On: 09/01/2018 08:13   Dg Chest Portable 1 View  Result Date: 08/31/2018 CLINICAL DATA:  Endotracheal tube.  Respiratory distress EXAM: PORTABLE CHEST 1 VIEW COMPARISON:  05/14/2018 chest CT FINDINGS: Endotracheal tube tip just below the clavicular heads. The orogastric tube reaches the stomach. Hyperinflation. There is no edema, consolidation, effusion, or pneumothorax. Normal heart size. IMPRESSION: Hardware in expected position. COPD without acute superimposed finding.  Electronically Signed   By: Monte Fantasia M.D.   On: 08/31/2018 05:13   Vas [place] Lower Extremity Venous (dvt)  Result Date: 08/31/2018  Lower Venous Study Indications: Pulmonary embolism.  Performing Technologist: Abram Sander RVS  Examination Guidelines: A complete evaluation includes B-mode imaging, spectral Doppler, color Doppler, and power Doppler as needed of all accessible portions of each vessel. Bilateral testing is considered an integral part of a complete examination. Limited examinations for reoccurring indications may be performed as noted.  +---------+---------------+---------+-----------+----------+-------+ RIGHT    CompressibilityPhasicitySpontaneityPropertiesSummary +---------+---------------+---------+-----------+----------+-------+ CFV      Full           Yes      Yes                          +---------+---------------+---------+-----------+----------+-------+ SFJ      Full                                                 +---------+---------------+---------+-----------+----------+-------+ FV Prox  Full                                                 +---------+---------------+---------+-----------+----------+-------+ FV Mid   Full                                                 +---------+---------------+---------+-----------+----------+-------+ FV DistalFull                                                 +---------+---------------+---------+-----------+----------+-------+ PFV      Full                                                 +---------+---------------+---------+-----------+----------+-------+ POP      Full           Yes      Yes                          +---------+---------------+---------+-----------+----------+-------+ PTV      Full                                                 +---------+---------------+---------+-----------+----------+-------+  PERO     Full                                                  +---------+---------------+---------+-----------+----------+-------+   +---------+---------------+---------+-----------+----------+-------+ LEFT     CompressibilityPhasicitySpontaneityPropertiesSummary +---------+---------------+---------+-----------+----------+-------+ CFV      Full           Yes      Yes                          +---------+---------------+---------+-----------+----------+-------+ SFJ      Full                                                 +---------+---------------+---------+-----------+----------+-------+ FV Prox  Full                                                 +---------+---------------+---------+-----------+----------+-------+ FV Mid   Full                                                 +---------+---------------+---------+-----------+----------+-------+ FV DistalFull                                                 +---------+---------------+---------+-----------+----------+-------+ PFV      Full                                                 +---------+---------------+---------+-----------+----------+-------+ POP      Full           Yes      Yes                          +---------+---------------+---------+-----------+----------+-------+ PTV      Full                                                 +---------+---------------+---------+-----------+----------+-------+ PERO     Full                                                 +---------+---------------+---------+-----------+----------+-------+     Summary: Right: There is no evidence of deep vein thrombosis in the lower extremity. No cystic structure found in the popliteal fossa. Left: There is no evidence of deep vein thrombosis in the lower extremity. No cystic structure found in the popliteal fossa.  *See table(s) above for measurements  and observations. Electronically signed by Monica Martinez MD on 08/31/2018 at 6:10:55 PM.    Final      PHYSICAL  EXAM  Temp:  [96.6 F (35.9 C)-99.9 F (37.7 C)] 99.1 F (37.3 C) (05/20 1700) Pulse Rate:  [50-107] 89 (05/20 1612) Resp:  [12-34] 16 (05/20 1700) BP: (82-146)/(60-105) 116/77 (05/20 1700) SpO2:  [91 %-100 %] 100 % (05/20 1612) FiO2 (%):  [40 %-100 %] 40 % (05/20 1612) Weight:  [72.8 kg] 72.8 kg (05/20 0500)  General - Well nourished, well developed, intubated on sedation.  Ophthalmologic - fundi not visualized due to noncooperation.  Cardiovascular - Regular rhythm and rate.  Neuro - intubated on sedation, eyes closed, not following commands. With forced eye opening, eyes in mid position, not blinking to visual threat, doll's eyes absent, not tracking, pupil equal bilaterally, very sluggish to light. Corneal reflex weak bilaterally, gag and cough present and strong. Breathing over the vent.  Facial symmetry not able to test due to ET tube.  Tongue midline in mouth. On pain stimulation, mild withdrawal in BLEs and LUE but no movement on the RUE. DTR diminished and no babinski. Sensation, coordination and gait not tested.   ASSESSMENT/PLAN Mr. Audry Pecina is a 73 y.o. male with history of COPD, HTN, OSA admitted for SOB. No tPA given due to [place].    Encephalopathy with agitation  Not sure about baseline mental status, was reported to have baseline dementia  Extubation -> re-intubated  Not able to be explained by the punctate infarcts and micro bleeds on CT and MRI  CCM on board  UA showed UTI on rocephin  Hypotension, improving  blood culture 1/2 positive coagulase-negative likely contamination  CAA with punctate infarcts and microbleeds as well as left hemisphere hemosiderosis  MRI left frontal punctate infarcts and tiny ICH with hemosiderosis, consistent with CAA  MRA no AVM or aneurysm  LE venous doppler negative for DVT  2D Echo  EF 20-25%  LDL 83  HgbA1c 5.6  UDS + for benzo  SCDs for VTE prophylaxis  aspirin 81 mg daily prior to admission, now on No  antithrombotic due to [place]  Ongoing aggressive stroke risk factor management  Therapy recommendations:  Pending   Disposition:  Pending   PE at [facility]  CT chest showed small [facility] segmental PE  LE venous doppler no DVT  Not on AC due to small ICH  MRI consistent with CAA  Given likely CAA, no antithrombotic recommended.  However if pt is indicated for [facility], will recommend heparin IV with no bolus and heparin level 0.3-0.5 and close neuro check. If any neuro changes, please repeat CT head stat.   Cardiomyopathy with elevated troponin  Cardiology on board  EF 20 to 25%  Troponin elevated 0.79->1.91  Concerning for Takatsubo cardiomyopathy  Cards recommend ischemic work up once stable  Discussed with Dr. Sallyanne Kuster and no indication for [facility] at this time.   On Lasix and BiDil  Hypoglycemia  Glucose 71-101-65-51-71  Received D50 on 09/01/2018  Change IVF to D5NS   On tube feeding  Gentle hydration due to low EF  Respiratory failure  Intubated on admission  Extubated -> reintubated  CXR clear  On rocephin  Hypotension, improving . BP on the low end . CCM on board  BP goal < 140 for now  Close monitoring  Hyperlipidemia  Home meds:  pravastatin 10  LDL 83, goal < 70  Pravastatin 10 resumed  Continue statin on discharge  UTI  UA WBC > 50  On rocephin  Other Stroke Risk Factors  Advanced age  Obstructive sleep apnea, on CPAP at home  Other Active Problems  COPD  Hospital day # 2  This patient is critically ill due to CAA and ICH, respiratory failure, cardiomyopathy, PE, hypoglycemia, hypotension and at significant risk of neurological worsening, death form shock, hematoma expansion, brain edema, heart failure, seizure. This patient's care requires constant monitoring of vital signs, hemodynamics, respiratory and cardiac monitoring, review of multiple databases, neurological assessment, discussion with family, other specialists and medical  decision making of high complexity. I spent 35 minutes of neurocritical care time in the care of this patient.  Rosalin Hawking, MD PhD Stroke Neurology 09/02/2018 5:46 PM    To contact Stroke Continuity provider, please refer to [email or web address] After hours, contact General Neurology

## 2018-09-02 NOTE — Progress Notes (Signed)
Patient taken to MRI and back to 2M09 without any complications.

## 2018-09-02 NOTE — Telephone Encounter (Signed)
From pt's chart, pt was originally taken by EMS to [facility] but based on pt's condition and what was seen on scans, pt was transferred to [facility]. Pt is being followed by our critical care team.  Attempted to call pt's wife [name] but line went straight to VM. Left message for [name] to return call.

## 2018-09-02 NOTE — Telephone Encounter (Signed)
Pt's wife [name] is calling back [phone]  Call after 2:30

## 2018-09-02 NOTE — Evaluation (Signed)
SLP Cancellation Note  Patient Details Name: Jeremiah [name] MRN: [id number] DOB: 09/27/1945   Cancelled treatment:       Reason Eval/Treat Not Completed: Medical issues which prohibited therapy(pt on vent)   Jeremiah [name] 09/02/2018, 7:25 AM   Luanna Salk, MS [facility] SLP Acute Rehab Services Pager [phone] Office [phone]

## 2018-09-02 NOTE — Progress Notes (Signed)
EEG Complete  Results Pending 

## 2018-09-03 ENCOUNTER — Ambulatory Visit: Payer: Medicare Other | Admitting: Internal Medicine

## 2018-09-03 LAB — BASIC METABOLIC PANEL
Anion gap: 19 — ABNORMAL HIGH (ref 5–15)
Anion gap: 12 (ref 5–15)
BUN: 25 mg/dL — ABNORMAL HIGH (ref 8–23)
BUN: 27 mg/dL — ABNORMAL HIGH (ref 8–23)
CO2: 20 mmol/L — ABNORMAL LOW (ref 22–32)
CO2: 26 mmol/L (ref 22–32)
Calcium: 9.4 mg/dL (ref 8.9–10.3)
Calcium: 9.2 mg/dL (ref 8.9–10.3)
Chloride: 105 mmol/L (ref 98–111)
Chloride: 105 mmol/L (ref 98–111)
Creatinine, Ser: 1.23 mg/dL (ref 0.61–1.24)
Creatinine, Ser: 1.33 mg/dL — ABNORMAL HIGH (ref 0.61–1.24)
GFR calc Af Amer: >60 mL/min (ref >60)
GFR calc Af Amer: >60 mL/min (ref >60)
GFR calc non Af Amer: 59 mL/min — ABNORMAL LOW (ref >60)
GFR calc non Af Amer: 54 mL/min — ABNORMAL LOW (ref >60)
Glucose, Bld: 90 mg/dL (ref 70–99)
Glucose, Bld: 112 mg/dL — ABNORMAL HIGH (ref 70–99)
Potassium: 5.6 mmol/L — ABNORMAL HIGH (ref 3.5–5.1)
Potassium: 3.3 mmol/L — ABNORMAL LOW (ref 3.5–5.1)
Sodium: 144 mmol/L (ref 135–145)
Sodium: 143 mmol/L (ref 135–145)

## 2018-09-03 LAB — TRIGLYCERIDES: Triglycerides: 64 mg/dL (ref <150)

## 2018-09-03 LAB — GLUCOSE, CAPILLARY
Glucose-Capillary: 115 mg/dL — ABNORMAL HIGH (ref 70–99)
Glucose-Capillary: 157 mg/dL — ABNORMAL HIGH (ref 70–99)
Glucose-Capillary: 42 mg/dL — CL (ref 70–99)
Glucose-Capillary: 59 mg/dL — ABNORMAL LOW (ref 70–99)
Glucose-Capillary: 62 mg/dL — ABNORMAL LOW (ref 70–99)
Glucose-Capillary: 71 mg/dL (ref 70–99)
Glucose-Capillary: 79 mg/dL (ref 70–99)
Glucose-Capillary: 83 mg/dL (ref 70–99)
Glucose-Capillary: 87 mg/dL (ref 70–99)
Glucose-Capillary: 88 mg/dL (ref 70–99)

## 2018-09-03 LAB — PHOSPHORUS
Phosphorus: 3.2 mg/dL (ref 2.5–4.6)
Phosphorus: 2.5 mg/dL (ref 2.5–4.6)

## 2018-09-03 LAB — MAGNESIUM
Magnesium: 2.6 mg/dL — ABNORMAL HIGH (ref 1.7–2.4)
Magnesium: 2.4 mg/dL (ref 1.7–2.4)

## 2018-09-03 MED ORDER — DEXTROSE 50 % IV SOLN
50.0000 mL | Freq: Once | INTRAVENOUS | Status: AC
  Administered 2018-09-03: 50 mL via INTRAVENOUS

## 2018-09-03 MED ORDER — SODIUM CHLORIDE 0.9 % IV SOLN
1.0000 g | INTRAVENOUS | Status: AC
  Administered 2018-09-03: 1 g via INTRAVENOUS
  Filled 2018-09-03: qty 10

## 2018-09-03 MED ORDER — DEXTROSE 50 % IV SOLN
INTRAVENOUS | Status: AC
  Filled 2018-09-03: qty 50

## 2018-09-03 MED ORDER — FUROSEMIDE 10 MG/ML IJ SOLN
40.0000 mg | Freq: Once | INTRAMUSCULAR | Status: AC
  Administered 2018-09-03: 40 mg via INTRAVENOUS
  Filled 2018-09-03: qty 4

## 2018-09-03 MED ORDER — ISOSORB DINITRATE-HYDRALAZINE 20-37.5 MG PO TABS
1.0000 | ORAL_TABLET | Freq: Three times a day (TID) | ORAL | Status: DC
  Administered 2018-09-03 (×2): 1 via ORAL
  Filled 2018-09-03 (×3): qty 1

## 2018-09-03 MED ORDER — POTASSIUM CHLORIDE 20 MEQ/15ML (10%) PO SOLN
20.0000 meq | Freq: Two times a day (BID) | ORAL | Status: DC
  Administered 2018-09-03 (×2): 20 meq via ORAL
  Filled 2018-09-03: qty 15

## 2018-09-03 MED ORDER — DEXTROSE 50 % IV SOLN
25.0000 mL | Freq: Once | INTRAVENOUS | Status: AC
  Administered 2018-09-03: 25 mL via INTRAVENOUS

## 2018-09-03 MED ORDER — VITAL AF 1.2 CAL PO LIQD
1000.0000 mL | ORAL | Status: DC
  Administered 2018-09-03: 1000 mL

## 2018-09-03 MED ORDER — SODIUM CHLORIDE 0.9 % IV SOLN
2.0000 g | INTRAVENOUS | Status: DC
  Administered 2018-09-04: 2 g via INTRAVENOUS
  Filled 2018-09-03: qty 20

## 2018-09-03 NOTE — Progress Notes (Signed)
Pt tx to CT scan this AM w/ vss. Dr. Ashley Mariner spoke with physician brother in law at length with detailed explanation of results from scans, assessments, interventions, and responses. I also updated brother in law to RN assessents, interventions, and responses. Pt's wife and other family to be updated by brother in law.

## 2018-09-03 NOTE — Progress Notes (Signed)
PT Cancellation Note  Patient Details Name: Jeremiah [name] MRN: [id number] DOB: 1945-06-19   Cancelled Treatment:    Reason Eval/Treat Not Completed: Active bedrest order Active bedrest order. Pt continues to have active bedrest orders. PT will continue to follow for evaluation.   Reinaldo Berber, PT, DPT Acute Rehabilitation Services Pager: [phone] Office: [place] 09/03/2018, 8:47 AM

## 2018-09-03 NOTE — Progress Notes (Signed)
OT Cancellation Note  Patient Details Name: Jeremiah [name] MRN: [id number] DOB: 21-Jan-1946   Cancelled Treatment:    Reason Eval/Treat Not Completed: Patient not medically ready;Active bedrest order. Pt continues to have active bedrest orders. OT will continue to follow for evaluation.  Merri Ray Franki Alcaide 09/03/2018, 8:34 AM   Hulda Humphrey OTR/L Acute Rehabilitation Services Pager: [phone] Office: [phone]

## 2018-09-03 NOTE — Telephone Encounter (Signed)
When I spoke with pt's wife [name], I stated to her that pt was in good hands with the staff that is working on pt and she expressed understanding.

## 2018-09-03 NOTE — Progress Notes (Signed)
SLP Cancellation Note  Patient Details Name: Janmichael Giraud MRN: [id number] DOB: 1945-12-21   Cancelled treatment:       Reason Eval/Treat Not Completed: Medical issues which prohibited therapy(Pt remains on vent. SLP will follow up. )  Trayonna Bachmeier I. Hardin Negus, [place], [place] Office number [phone] Pager [phone]  [name] 09/03/2018, 7:47 AM

## 2018-09-03 NOTE — Procedures (Signed)
ELECTROENCEPHALOGRAM REPORT   Patient: Jeremiah [name]       Room #: 6D47W EEG No. ID: [id number] Age: 73 y.o.        Sex: male Referring Physician: Agarwala Report Date:  09/03/2018        Interpreting Physician: Alexis Goodell  History: Elven Laboy is an 73 y.o. male with altered mental status  Medications:  Xanax, Rocephin, Hydralazine, Cozaar, Pravachol, Desyrel, Diprovan  Conditions of Recording:  This is a 21 channel routine scalp EEG performed with bipolar and monopolar montages arranged in accordance to the international 10/20 system of electrode placement. One channel was dedicated to EKG recording.  The patient is in the intubated and sedated state.  Description:  Artifact is prominent during the recording often obscuring the background rhythm. When able to be visualized the background is slow and poorly organized.   It consists of a low voltage, polymorphic delta rhythm that is diffusely distributed and continuous.  There are also intermittent periodic discharges of triphasic morphology noted as well. Hyperventilation and intermittent photic stimulation were not performed.  IMPRESSION: This is a technically difficult electroencephalogram due to the presence of muscle and movement artifact.  The portion able to be evaluated was abnormal  due to general background slowing and the presence of triphasic waves.  These are seen most commonly in encephalopathic states.  An influence of medication effect is likely present as well.     Alexis Goodell, MD Neurology [phone] 09/03/2018, 8:52 AM

## 2018-09-03 NOTE — Telephone Encounter (Signed)
Reviewed chart - He is covid negative but overal very sick. LEt them know ICU team is excellent and taking good care of him. And they should communicate with ICU RN/MD directly.  I am not at [facility] cone rotation currently

## 2018-09-03 NOTE — Progress Notes (Signed)
Pt w/ subtherapeutic CBG at 1130. Pt had just been given nutrition supplement per OGT that had not had time to absorb to blood stream. CBG repeated at 1230 to moitor glucose stability. Pt tx to radiology for nasal feeding tube with Steffanie Dunn RN from [employer] team. Pt with VSS throught tx and return.

## 2018-09-03 NOTE — Progress Notes (Signed)
Pt care tx to [facility]. Jeremiah [name] will contact wife to ensure updates recvd from family and further updat spouse.

## 2018-09-03 NOTE — Progress Notes (Addendum)
Progress Note  Patient Name: Jeremiah [name] Date of Encounter: 09/03/2018  Primary Cardiologist: Sanda Klein, MD   Subjective   Patient remains intubated. Respiratory mechanics are fair and oxygenation is good, but unable to extubate due to low level of consciousness, despite stopping propofol. Good response to diuretic. BP is higher, tolerating it well. Mild hypokalemia.  Inpatient Medications    Scheduled Meds:   stroke: mapping our early stages of recovery book   Does not apply Once   ALPRAZolam  0.5 mg Per Tube BID   chlorhexidine gluconate (MEDLINE KIT)  15 mL Mouth Rinse BID   Chlorhexidine Gluconate Cloth  6 each Topical Daily   feeding supplement (PRO-STAT SUGAR FREE 64)  60 mL Per Tube BID   furosemide  20 mg Intravenous Q12H   ipratropium-albuterol  3 mL Nebulization Q6H   isosorbide-hydrALAZINE  0.5 tablet Oral TID   losartan  12.5 mg Oral Daily   mouth rinse  15 mL Mouth Rinse 10 times per day   pantoprazole (PROTONIX) IV  40 mg Intravenous QHS   pravastatin  10 mg Per Tube q1800   QUEtiapine  25 mg Per Tube QHS   senna-docusate  1 tablet Oral BID   traZODone  50 mg Oral QHS   Continuous Infusions:  sodium chloride 10 mL/hr at 09/03/18 0800   cefTRIAXone (ROCEPHIN)  IV 1 g (09/03/18 0753)   dextrose 5 % and 0.9% NaCl 40 mL/hr at 09/03/18 0600   feeding supplement (VITAL AF 1.2 CAL) 30 mL/hr at 09/03/18 0800   propofol (DIPRIVAN) infusion 40 mcg/kg/min (09/03/18 0627)   PRN Meds: acetaminophen **OR** acetaminophen (TYLENOL) oral liquid 160 mg/5 mL **OR** acetaminophen, albuterol, fentaNYL (SUBLIMAZE) injection, haloperidol lactate   Vital Signs    Vitals:   09/03/18 0600 09/03/18 0700 09/03/18 0800 09/03/18 0824  BP: 112/81 128/83 127/90   Pulse: 73 81 83   Resp: _0 Temp: 98.4 F (36.9 C) 98.8 F (37.1 C) 99.5 F (37.5 C)   TempSrc:   Bladder   SpO2: 100% 100% 99% 100%  Weight:      Height:        Intake/Output  Summary (Last 24 hours) at 09/03/2018 0824 Last data filed at 09/03/2018 0800 Gross per 24 hour  Intake 2133.88 ml  Output 2665 ml  Net -531.12 ml   Last 3 Weights 09/03/2018 09/02/2018 09/01/2018  Weight (lbs) 155 lb 13.8 oz 160 lb 7.9 oz 168 lb 3.4 oz  Weight (kg) 70.7 kg 72.8 kg 76.3 kg      Telemetry    NSR - Personally Reviewed  ECG    No new ECG tracing today. - Personally Reviewed  Physical Exam   Physical Exam per MD:    GEN: No acute distress.  Intubated. Responds to voice, does not follow commands Neck: 5-6 cm JVD Cardiac: RRR, no murmurs, rubs, or gallops.  Respiratory: mild wheezing bilaterally GI: Soft, nontender, non-distended  MS: No edema; No deformity. Neuro:  N/a  Psych: N/a   Labs    Chemistry Recent Labs  Lab 08/31/18 0458 09/01/18 0253  09/01/18 2134 09/02/18 0431 09/02/18 1645  NA 138 139   < > 142 143 142  K 3.8 4.6   < > 4.0 4.8 3.7  CL 103 104  --   --  110 104  CO2 22 21*  --   --  19* 28  GLUCOSE 200* 124*  --   --  104* 159*  BUN 17 17  --   --  19 18  CREATININE 1.22 1.39*  --   --  1.01 1.22  CALCIUM 10.0 9.4  --   --  9.2 9.4  PROT 8.3*  --   --   --   --   --   ALBUMIN 4.6  --   --   --   --   --   AST 34  --   --   --   --   --   ALT 14  --   --   --   --   --   ALKPHOS 85  --   --   --   --   --   BILITOT 1.0  --   --   --   --   --   GFRNONAA 60* 51*  --   --  >60 60*  GFRAA >60 59*  --   --  >60 >60  ANIONGAP 13 14  --   --  14 10   < > = values in this interval not displayed.     Hematology Recent Labs  Lab 08/31/18 0458 09/01/18 0253 09/01/18 0340 09/01/18 2134 09/02/18 0431  WBC 13.1* 12.3*  --   --  13.8*  RBC 5.83* 5.38  --   --  5.24  HGB 17.9* 16.6 16.0 14.3 16.0  HCT 54.4* 50.0 47.0 42.0 49.9  MCV 93.3 92.9  --   --  95.2  MCH 30.7 30.9  --   --  30.5  MCHC 32.9 33.2  --   --  32.1  RDW 12.6 12.6  --   --  12.7  PLT 262 201  --   --  156    Cardiac Enzymes Recent Labs  Lab 08/31/18 0458  08/31/18 0952 08/31/18 1536 09/01/18 1439  TROPONINI 0.04* 0.79* 1.91* 1.29*   No results for input(s): TROPIPOC in the last 168 hours.   BNP Recent Labs  Lab 08/31/18 0459  BNP 285.0*     DDimer No results for input(s): DDIMER in the last 168 hours.   Radiology    Dg Abd 1 View  Result Date: 09/02/2018 CLINICAL DATA:  Enteric tube placement EXAM: ABDOMEN - 1 VIEW COMPARISON:  None. FINDINGS: An NJ tube was placed using fluoroscopic guidance. The total fluoroscopy time was 3 minutes and 30 seconds. 20 mL of Omnipaque 300 was utilized for this examination. A 10 French NJ tube was placed. Injection of contrast demonstrates opacification of the jejunum. A second enteric tube projects over the gastric body. IMPRESSION: NJ tube placement as above.  The tip terminates in the jejunum. Electronically Signed   By: Constance Holster M.D.   On: 09/02/2018 13:44   Ct Head Wo Contrast  Result Date: 09/02/2018 CLINICAL DATA:  Follow-up cerebral hemorrhage. EXAM: CT HEAD WITHOUT CONTRAST TECHNIQUE: Contiguous axial images were obtained from the base of the skull through the vertex without intravenous contrast. COMPARISON:  Head CT 08/31/2018 and MRI 09/02/2018 FINDINGS: Brain: A 5 mm focus of subcortical hemorrhage in the posterior left frontal lobe is unchanged in size from the prior CT and is without associated edema. Focal linear hyperattenuation in a left parietal sulcus is unchanged from the prior CT and may represent a vessel versus trace subarachnoid hemorrhage. There is no new intracranial hemorrhage elsewhere. Punctate acute infarcts in the left frontal and left temporal lobes on today's MRI are not well demonstrated by CT. Confluent cerebral white matter  hypodensities are unchanged and compatible with severe chronic small vessel ischemic disease. Mild cerebral atrophy is noted. There is no midline shift or extra-axial fluid collection. Vascular: Calcified atherosclerosis at the skull base. Skull:  No fracture or focal osseous lesion. Sinuses/Orbits: Minimal scattered mucosal thickening in the paranasal sinuses. Clear mastoid air cells. Unremarkable orbits. Other: None. IMPRESSION: 1. Unchanged small hemorrhage in the posterior left frontal lobe. 2. Unchanged focal density in a left parietal sulcus which may be vascular or indicative of trace subarachnoid hemorrhage. 3. No new intracranial hemorrhage. 4. Severe chronic small vessel ischemic disease. Electronically Signed   By: Logan Bores M.D.   On: 09/02/2018 12:40   [name] Head Wo Contrast  Result Date: 09/02/2018 CLINICAL DATA:  73 y/o  M; left frontal lobe cerebral hemorrhage. EXAM: MRI HEAD WITHOUT CONTRAST MRA HEAD WITHOUT CONTRAST TECHNIQUE: Multiplanar, multiecho pulse sequences of the brain and surrounding structures were obtained without intravenous contrast. Angiographic images of the head were obtained using MRA technique without contrast. COMPARISON:  08/31/2018 CT of the head. FINDINGS: MRI HEAD FINDINGS Brain: 5 mm focus of acute hemorrhage within the left frontal lobe is associated with a small linear focus of subcortical reduced diffusion compatible with acute/early subacute infarction (series 5, image 90). Additional punctate foci of reduced diffusion are present in the left anterior frontal lobe and the left temporal lobe without associated hemorrhage. Large confluent nonspecific T2 FLAIR hyperintensities in subcortical and periventricular white matter are compatible with advanced chronic microvascular ischemic changes. Mild-to-moderate volume loss of the brain. Small chronic infarct is present within the left cerebellar hemisphere. There are numerous small foci of susceptibility hypointensity throughout the brain compatible with hemosiderin deposition of microhemorrhage. Foci of hemorrhage are in a predominantly peripheral distribution. Additionally, there are areas of superficial siderosis over the left-greater-than-right cerebral  convexities from prior subarachnoid hemorrhage. Vascular: As below. Skull and upper cervical spine: Normal marrow signal. Sinuses/Orbits: Negative. Other: None. MRA HEAD FINDINGS Anterior circulation: No large vessel occlusion, aneurysm, or significant stenosis is identified. Posterior circulation: No large vessel occlusion, aneurysm, or significant stenosis is identified. Anatomic variation: Large left A1, small right A1, large anterior communicating artery, normal variant. Fetal right PCA. IMPRESSION: MRI head: 1. 5 mm focus of acute hemorrhage in the left frontal lobe is associated with a small subcortical acute/early subacute infarction. Additional punctate foci of acute/early subacute infarction are present in the left anterior frontal lobe and the left temporal lobe. 2. Numerous foci of chronic hemorrhage throughout the brain in a predominant peripheral distribution as well as superficial siderosis over the left-greater-than-right cerebral convexities. The peripheral distribution favors amyloid angiopathy over chronic sequelae of hypertension. 3. Severe chronic microvascular ischemic changes and mild to moderate volume loss of the brain. MRA head: Patent anterior and posterior intracranial circulation. No large vessel occlusion, aneurysm, or significant stenosis is identified. Electronically Signed   By: Kristine Garbe M.D.   On: 09/02/2018 02:28   Mr Brain Wo Contrast  Result Date: 09/02/2018 CLINICAL DATA:  73 y/o  M; left frontal lobe cerebral hemorrhage. EXAM: MRI HEAD WITHOUT CONTRAST MRA HEAD WITHOUT CONTRAST TECHNIQUE: Multiplanar, multiecho pulse sequences of the brain and surrounding structures were obtained without intravenous contrast. Angiographic images of the head were obtained using MRA technique without contrast. COMPARISON:  08/31/2018 CT of the head. FINDINGS: MRI HEAD FINDINGS Brain: 5 mm focus of acute hemorrhage within the left frontal lobe is associated with a small linear  focus of subcortical reduced diffusion compatible with acute/early  subacute infarction (series 5, image 90). Additional punctate foci of reduced diffusion are present in the left anterior frontal lobe and the left temporal lobe without associated hemorrhage. Large confluent nonspecific T2 FLAIR hyperintensities in subcortical and periventricular white matter are compatible with advanced chronic microvascular ischemic changes. Mild-to-moderate volume loss of the brain. Small chronic infarct is present within the left cerebellar hemisphere. There are numerous small foci of susceptibility hypointensity throughout the brain compatible with hemosiderin deposition of microhemorrhage. Foci of hemorrhage are in a predominantly peripheral distribution. Additionally, there are areas of superficial siderosis over the left-greater-than-right cerebral convexities from prior subarachnoid hemorrhage. Vascular: As below. Skull and upper cervical spine: Normal marrow signal. Sinuses/Orbits: Negative. Other: None. MRA HEAD FINDINGS Anterior circulation: No large vessel occlusion, aneurysm, or significant stenosis is identified. Posterior circulation: No large vessel occlusion, aneurysm, or significant stenosis is identified. Anatomic variation: Large left A1, small right A1, large anterior communicating artery, normal variant. Fetal right PCA. IMPRESSION: MRI head: 1. 5 mm focus of acute hemorrhage in the left frontal lobe is associated with a small subcortical acute/early subacute infarction. Additional punctate foci of acute/early subacute infarction are present in the left anterior frontal lobe and the left temporal lobe. 2. Numerous foci of chronic hemorrhage throughout the brain in a predominant peripheral distribution as well as superficial siderosis over the left-greater-than-right cerebral convexities. The peripheral distribution favors amyloid angiopathy over chronic sequelae of hypertension. 3. Severe chronic microvascular  ischemic changes and mild to moderate volume loss of the brain. MRA head: Patent anterior and posterior intracranial circulation. No large vessel occlusion, aneurysm, or significant stenosis is identified. Electronically Signed   By: Kristine Garbe M.D.   On: 09/02/2018 02:28   Dg Chest Port 1 View  Result Date: 09/01/2018 CLINICAL DATA:  73 year old male intubated. Respiratory failure. Negative for COVID-19 yesterday. EXAM: PORTABLE CHEST 1 VIEW COMPARISON:  0518 hours today and earlier. FINDINGS: Portable AP semi upright views at 2030 hours. ETT tip in good position between the level the clavicles and carina. Enteric tube tip is at the level of the gastric cardia, the side hole is at the distal esophagus level on image 2. Stable lung volumes and mediastinal contours. Allowing for portable technique the lungs are clear. Negative visible bowel gas pattern. No acute osseous abnormality identified. IMPRESSION: 1. ETT in good position. Enteric tube side hole in the distal esophagus, advance 6 cm to ensure side hole placement within the stomach. 2. No acute cardiopulmonary abnormality. Electronically Signed   By: Genevie Ann M.D.   On: 09/01/2018 20:47    Cardiac Studies   Echocardiogram 08/31/2018: Impressions: 1. The left ventricle has severely reduced systolic function, with an ejection fraction of 20-25%. The cavity size was normal. There is mildly increased left ventricular wall thickness. Findings are consistent with Takatsubo cardiomyopathy or  multivessel CAD. Left ventricular diastolic Doppler parameters are consistent with pseudonormalization. Elevated left atrial and left ventricular end-diastolic pressures The E/e' is >15. Left ventricular diffuse hypokinesis.  2. The right ventricle has moderately reduced systolic function. The cavity was normal. There is mildly increased right ventricular wall thickness.  3. The mitral valve is abnormal. Mild thickening of the mitral valve leaflet. There  is mild mitral annular calcification present.  4. The tricuspid valve is grossly normal.  5. The aortic valve appears bicuspid. Mild sclerosis of the aortic valve. Aortic valve regurgitation is trivial by color flow Doppler. No stenosis of the aortic valve.  6. The inferior vena cava was dilated  in size with <50% respiratory variability.  Summary: LVEF 20-25%, mild LVH, severe apical hypokinesis with relatively preserved base -may suggest Taksubo cardiomyopathy or multivessel coronary disease, grade 2 DD with elevated LV filling pressure, moderate RV systolic dysfunction, normal biatrial size, possibly bicuspid aortic valve, MAC with trivial MR, trivial TR, RVSP 38 mmHg, dilated IVC.  Patient Profile   Mr. Minnie 73 y.o. male with a history of hypertension, COPD, and obstructive sleep apnea who is being seen today for evaluation of new LV systolic dysfunction, abnormal EKG, and elevated troponin after intubation for COPD exacerbation and incidentally found to have a small subsegmental PE and intracranial bleed.  Assessment & Plan    Newly Diagnosed Systolic CHF - BNP 242.6. - Echo showed LVEF of 20-25% with grade 2 diastolic dysfunction and severe apical hypokinesis with relatively preserved base. May suggest Taksubo cardiomyopathy or multivessel CAD. - Patient got one dose of IV Lasix 54m yesterday with good response. Documented urinary output of 2.7 L in the past 24 hours. - will give another dose of diuretic today, then dose based on renal parameters - Continue Losartan 12.5109mdaily. Will hold off titration due to change in renal function. Increase Hydralazine/Nitrates. - Evolving ECG changes look more like resolving myopericarditis, but cannot exclude global ischemia. Once more more stable, will need cardiac catheterization to assess for CAD. Plan limited echo on 5/26 to assess changes in LVEF.  Elevated Troponin  - Troponin elevated at 0.04 >> 0.79 >> 1.91 >> 1.29. Unclear etiology.  Will ultimately need ischemic w/u to exclude obstructive CAD once medically stable. No heparin currently due to [place]  COPD Exacerbation/ Acute Hypoxic Respiratory Failure  - Required reintubation on 09/01/2018. - Ventilator management per PCCM.  Acute Segmental Pulmonary Embolism - Chest CTA showed subsegmental pulmonary embolism to the left lower lobe. - Lower extremity dopplers negative for DVT. - Not currently on anticoagulation due to [place] - Brain MRI showed 61m23mocus of acute hemorrhage in the left frontal lobe associated with a small subcortical acute/early subacute infarction.  - Management per Neurology.  Otherwise, per primary team.  For questions or updates, please contact [place] consult [email or web address] for contact info under        Signed, CalDarreld McleanA-C  09/03/2018, 8:24 AM    I have seen and examined the patient along with CalDarreld McleanA-C .  I have reviewed the chart, notes and new data.  I agree with PA/NP's note.  Key new complaints: remains sedated Key examination changes: my exam above Key new findings / data: k 3.3 (replaced) , creat 1.33, BP higher  PLAN: Additional diuresis. Increase vasodilators. Recheck echo on 5/26. Eventually plan coronary angio.   MihSanda KleinD, [id number]/2020, 2:07 PM  MihSanda KleinD, FACHeart Of Texas Memorial HospitalartCare ([phone]

## 2018-09-03 NOTE — Progress Notes (Signed)
Assisted tele visit to patient with wife.  Christalyn Goertz, Philis Nettle, RN

## 2018-09-03 NOTE — Progress Notes (Signed)
STROKE TEAM PROGRESS NOTE   SUBJECTIVE (INTERVAL HISTORY) His RN is at the bedside. Pt just off sedation and currently on weaning trials. He still intubated, not responsive. Still withdraw all extremities on pain stimulation.    OBJECTIVE Temp:  [97.3 F (36.3 C)-99.9 F (37.7 C)] 99.3 F (37.4 C) (05/21 0824) Pulse Rate:  [66-103] 83 (05/21 0824) Cardiac Rhythm: Normal sinus rhythm (05/21 0800) Resp:  [12-25] 14 (05/21 0824) BP: (73-185)/(58-136) 127/90 (05/21 0800) SpO2:  [98 %-100 %] 100 % (05/21 0831) FiO2 (%):  [40 %-50 %] 40 % (05/21 0831) Weight:  [70.7 kg] 70.7 kg (05/21 0451)  Recent Labs  Lab 09/03/18 0004 09/03/18 0022 09/03/18 0426 09/03/18 0751 09/03/18 0839  GLUCAP 62* 157* 115* 59* 88   Recent Labs  Lab 08/31/18 0458 09/01/18 0253 09/01/18 0340 09/01/18 2134 09/02/18 0431 09/02/18 1009 09/02/18 1645 09/03/18 0544  NA 138 139 139 142 143  --  142  --   K 3.8 4.6 4.0 4.0 4.8  --  3.7  --   CL 103 104  --   --  110  --  104  --   CO2 22 21*  --   --  19*  --  28  --   GLUCOSE 200* 124*  --   --  104*  --  159*  --   BUN 17 17  --   --  19  --  18  --   CREATININE 1.22 1.39*  --   --  1.01  --  1.22  --   CALCIUM 10.0 9.4  --   --  9.2  --  9.4  --   MG  --  2.2  --   --  2.3 2.3 2.5* 2.6*  PHOS  --  3.0  --   --  3.3 3.3 3.3 3.2   Recent Labs  Lab 08/31/18 0458  AST 34  ALT 14  ALKPHOS 85  BILITOT 1.0  PROT 8.3*  ALBUMIN 4.6   Recent Labs  Lab 08/31/18 0458 09/01/18 0253 09/01/18 0340 09/01/18 2134 09/02/18 0431  WBC 13.1* 12.3*  --   --  13.8*  NEUTROABS 9.6*  --   --   --   --   HGB 17.9* 16.6 16.0 14.3 16.0  HCT 54.4* 50.0 47.0 42.0 49.9  MCV 93.3 92.9  --   --  95.2  PLT 262 201  --   --  156   Recent Labs  Lab 08/31/18 0458 08/31/18 0952 08/31/18 1536 09/01/18 1439  TROPONINI 0.04* 0.79* 1.91* 1.29*   No results for input(s): LABPROT, INR in the last 72 hours. No results for input(s): COLORURINE, LABSPEC, [place],  GLUCOSEU, HGBUR, BILIRUBINUR, KETONESUR, PROTEINUR, UROBILINOGEN, NITRITE, LEUKOCYTESUR in the last 72 hours.  Invalid input(s): APPERANCEUR     Component Value Date/Time   CHOL 159 08/31/2018 1830   TRIG 64 09/03/2018 0544   HDL 66 08/31/2018 1830   CHOLHDL 2.4 08/31/2018 1830   VLDL 10 08/31/2018 1830   LDLCALC 83 08/31/2018 1830   Lab Results  Component Value Date   HGBA1C 5.6 08/31/2018      Component Value Date/Time   LABOPIA NONE DETECTED 08/31/2018 2049   COCAINSCRNUR NONE DETECTED 08/31/2018 2049   LABBENZ POSITIVE (A) 08/31/2018 2049   AMPHETMU NONE DETECTED 08/31/2018 2049   THCU NONE DETECTED 08/31/2018 2049   LABBARB NONE DETECTED 08/31/2018 2049    No results for input(s): ETH in the last 168 hours.  IMAGING  I have personally reviewed the radiological images below and agree with the radiology interpretations.  Ct Head Wo Contrast 09/02/2018 IMPRESSION:  1. Unchanged small hemorrhage in the posterior left frontal lobe.  2. Unchanged focal density in a left parietal sulcus which may be vascular or indicative of trace subarachnoid hemorrhage.  3. No new intracranial hemorrhage.  4. Severe chronic small vessel ischemic disease.   Ct Head Wo Contrast 08/31/2018  IMPRESSION:  1. 5 mm acute subcortical hemorrhage in the left frontal lobe. Extensive chronic white matter disease, question amyloid angiopathy given this location.  2. Attention to a minimally high-dense left parietal sulcus on follow-up.   Ct Angio Chest Pe W And/or Wo Contrast 08/31/2018 IMPRESSION:  1. Single subsegmental pulmonary embolism to the left lower lobe.  2. Coronary atherosclerosis.  3. Emphysema.    [name] Head Wo Contrast 09/02/2018 IMPRESSION:   MRI head:  1. 5 mm focus of acute hemorrhage in the left frontal lobe is associated with a small subcortical acute/early subacute infarction. Additional punctate foci of acute/early subacute infarction are present in the left anterior  frontal lobe and the left temporal lobe.  2. Numerous foci of chronic hemorrhage throughout the brain in a predominant peripheral distribution as well as superficial siderosis over the left-greater-than-right cerebral convexities. The peripheral distribution favors amyloid angiopathy over chronic sequelae of hypertension.  3. Severe chronic microvascular ischemic changes and mild to moderate volume loss of the brain.   MRA head:  Patent anterior and posterior intracranial circulation. No large vessel occlusion, aneurysm, or significant stenosis is identified.    Dg Chest Port 1 View 09/01/2018 IMPRESSION:  1. ETT in good position. Enteric tube side hole in the distal esophagus, advance 6 cm to ensure side hole placement within the stomach.  2. No acute cardiopulmonary abnormality.   Dg Chest Port 1 View 09/01/2018 IMPRESSION:  Tubes and lines as described. Emphysematous change without acute abnormality.   Dg Chest Portable 1 View 08/31/2018 IMPRESSION:  Hardware in expected position. COPD without acute superimposed finding.   Dg Abd 1 View 09/02/2018 IMPRESSION:  NJ tube placement as above.  The tip terminates in the jejunum.    Vas [place] Lower Extremity Venous (dvt) 08/31/2018 Summary:  Right: There is no evidence of deep vein thrombosis in the lower extremity. No cystic structure found in the popliteal fossa.  Left: There is no evidence of deep vein thrombosis in the lower extremity. No cystic structure found in the popliteal fossa.   EEG 09/03/2018 This is a technically difficult electroencephalogram due to the presence of muscle and movement artifact.  The portion able to be evaluated was abnormal  due to general background slowing and the presence of triphasic waves.  These are seen most commonly in encephalopathic states.  An influence of medication effect is likely present as well.     PHYSICAL EXAM  Temp:  [97.3 F (36.3 C)-99.9 F (37.7 C)] 99.3 F (37.4 C) (05/21  0824) Pulse Rate:  [66-103] 83 (05/21 0824) Resp:  [12-25] 14 (05/21 0824) BP: (73-185)/(58-136) 127/90 (05/21 0800) SpO2:  [98 %-100 %] 100 % (05/21 0831) FiO2 (%):  [40 %-50 %] 40 % (05/21 0831) Weight:  [70.7 kg] 70.7 kg (05/21 0451)  General - Well nourished, well developed, intubated just off sedation.  Ophthalmologic - fundi not visualized due to noncooperation.  Cardiovascular - Regular rhythm but tachycardia.  Neuro - intubated just off sedation, eyes closed, not following commands. With forced eye opening, eyes  in mid position, not blinking to visual threat, doll's eyes very sluggish and delayed, not tracking, pupil equal bilaterally, very sluggish to light. Corneal reflex present bilaterally, gag and cough present. Breathing over the vent on weaning trials.  Facial symmetry not able to test due to ET tube.  Tongue midline in mouth. On pain stimulation, withdrawal in all extremities, but stronger at BLEs symmetrical, LUE stronger that RUE. DTR diminished and no babinski. Sensation, coordination and gait not tested.   ASSESSMENT/PLAN Mr. Meshilem Machuca is a 74 y.o. male with history of COPD, HTN, OSA admitted for SOB. No tPA given due to [place].    Encephalopathy with agitation  Not sure about baseline mental status, was reported to have baseline dementia  Extubation -> re-intubated  Not able to be explained by the punctate infarcts and micro bleeds on CT and MRI  CCM on board  EEG - encephalopathic state, no seizure  UA showed UTI on rocephin  Hypotension resolved, off pressor  On Xanax and seroquel  blood culture 1/2 positive coagulase-negative likely contamination  CAA with punctate infarcts and microbleeds as well as left hemisphere hemosiderosis  MRI left frontal punctate infarcts and tiny ICH with hemosiderosis, consistent with CAA  MRA no AVM or aneurysm  LE venous doppler negative for DVT  2D Echo  EF 20-25%  LDL 83  HgbA1c 5.6  UDS + for  benzo  SCDs for VTE prophylaxis  aspirin 81 mg daily prior to admission, now on No antithrombotic due to [place] and CAA  Ongoing aggressive stroke risk factor management  Therapy recommendations:  Pending   Disposition:  Pending   PE at LLL  CT chest showed small LLL segmental PE  LE venous doppler no DVT  Not on AC due to small ICH and CAA  MRI consistent with CAA  Given likely CAA, no antithrombotic recommended.  However if pt is indicated for [facility], will recommend heparin IV with no bolus and heparin level 0.3-0.5 and close neuro check. If any neuro changes, please repeat CT head stat.   Cardiomyopathy with elevated troponin  Cardiology on board  EF 20 to 25%  Troponin elevated 0.79->1.91  Concerning for Takatsubo cardiomyopathy  Cards recommend ischemic work up once stable  Discussed with Dr. Sallyanne Kuster and no indication for [facility] at this time.   On Lasix and BiDil  Hypoglycemia  Glucose 71-101-65-51-71->59->88  Received D50 on 09/01/2018  Off IVF  On tube feeding  Respiratory failure  Intubated on admission  Extubated -> reintubated  CXR clear  On rocephin  Hypotension, improving . BP on the low end . CCM on board  BP goal < 140 for now  Close monitoring  Hyperlipidemia  Home meds:  pravastatin 10  LDL 83, goal < 70  Pravastatin 10 resumed  Continue statin on discharge  UTI  UA WBC > 50  On rocephin  Other Stroke Risk Factors  Advanced age  Obstructive sleep apnea, on CPAP at home  Other Active Problems  COPD  Mg - 2.6  Hospital day # 3  This patient is critically ill due to CAA and ICH, respiratory failure, cardiomyopathy, PE, hypoglycemia, hypotension and at significant risk of neurological worsening, death form shock, hematoma expansion, brain edema, heart failure, seizure. This patient's care requires constant monitoring of vital signs, hemodynamics, respiratory and cardiac monitoring, review of multiple databases,  neurological assessment, discussion with family, other specialists and medical decision making of high complexity. I spent 35 minutes of neurocritical care time in  the care of this patient.  Rosalin Hawking, MD PhD Stroke Neurology 09/03/2018 9:56 AM     To contact Stroke Continuity provider, please refer to [email or web address] After hours, contact General Neurology

## 2018-09-04 ENCOUNTER — Inpatient Hospital Stay (HOSPITAL_COMMUNITY): Payer: Medicare Other

## 2018-09-04 DIAGNOSIS — R6520 Severe sepsis without septic shock: Secondary | ICD-10-CM

## 2018-09-04 DIAGNOSIS — A419 Sepsis, unspecified organism: Secondary | ICD-10-CM

## 2018-09-04 DIAGNOSIS — J9621 Acute and chronic respiratory failure with hypoxia: Secondary | ICD-10-CM

## 2018-09-04 LAB — GLUCOSE, CAPILLARY
Glucose-Capillary: 74 mg/dL (ref 70–99)
Glucose-Capillary: 91 mg/dL (ref 70–99)
Glucose-Capillary: 117 mg/dL — ABNORMAL HIGH (ref 70–99)
Glucose-Capillary: 119 mg/dL — ABNORMAL HIGH (ref 70–99)
Glucose-Capillary: 108 mg/dL — ABNORMAL HIGH (ref 70–99)

## 2018-09-04 LAB — CBC WITH DIFFERENTIAL/PLATELET
Abs Immature Granulocytes: 0.07 10*3/uL (ref 0.00–0.07)
Basophils Absolute: 0 10*3/uL (ref 0.0–0.1)
Basophils Relative: 0 %
Eosinophils Absolute: 0 10*3/uL (ref 0.0–0.5)
Eosinophils Relative: 0 %
HCT: 47.6 % (ref 39.0–52.0)
Hemoglobin: 15.7 g/dL (ref 13.0–17.0)
Immature Granulocytes: 0 %
Lymphocytes Relative: 7 %
Lymphs Abs: 1.1 10*3/uL (ref 0.7–4.0)
MCH: 30.5 pg (ref 26.0–34.0)
MCHC: 33 g/dL (ref 30.0–36.0)
MCV: 92.6 fL (ref 80.0–100.0)
Monocytes Absolute: 2.2 10*3/uL — ABNORMAL HIGH (ref 0.1–1.0)
Monocytes Relative: 14 %
Neutro Abs: 12.5 10*3/uL — ABNORMAL HIGH (ref 1.7–7.7)
Neutrophils Relative %: 79 %
Platelets: 141 10*3/uL — ABNORMAL LOW (ref 150–400)
RBC: 5.14 MIL/uL (ref 4.22–5.81)
RDW: 12.8 % (ref 11.5–15.5)
WBC: 15.9 10*3/uL — ABNORMAL HIGH (ref 4.0–10.5)
nRBC: 0 % (ref 0.0–0.2)

## 2018-09-04 LAB — BASIC METABOLIC PANEL
Anion gap: 11 (ref 5–15)
BUN: 36 mg/dL — ABNORMAL HIGH (ref 8–23)
CO2: 29 mmol/L (ref 22–32)
Calcium: 9.3 mg/dL (ref 8.9–10.3)
Chloride: 105 mmol/L (ref 98–111)
Creatinine, Ser: 1.66 mg/dL — ABNORMAL HIGH (ref 0.61–1.24)
GFR calc Af Amer: 48 mL/min — ABNORMAL LOW (ref >60)
GFR calc non Af Amer: 41 mL/min — ABNORMAL LOW (ref >60)
Glucose, Bld: 123 mg/dL — ABNORMAL HIGH (ref 70–99)
Potassium: 3.6 mmol/L (ref 3.5–5.1)
Sodium: 145 mmol/L (ref 135–145)

## 2018-09-04 LAB — TRIGLYCERIDES: Triglycerides: 109 mg/dL (ref <150)

## 2018-09-04 MED ORDER — LACTATED RINGERS IV BOLUS
500.0000 mL | Freq: Once | INTRAVENOUS | Status: AC
  Administered 2018-09-04: 500 mL via INTRAVENOUS

## 2018-09-04 MED ORDER — POTASSIUM PHOSPHATES 15 MMOLE/5ML IV SOLN
20.0000 mmol | Freq: Once | INTRAVENOUS | Status: AC
  Administered 2018-09-04: 20 mmol via INTRAVENOUS
  Filled 2018-09-04: qty 6.67

## 2018-09-04 MED ORDER — VANCOMYCIN HCL IN DEXTROSE 1-5 GM/200ML-% IV SOLN
1000.0000 mg | INTRAVENOUS | Status: DC
  Administered 2018-09-05 – 2018-09-06 (×2): 1000 mg via INTRAVENOUS
  Filled 2018-09-04 (×2): qty 200

## 2018-09-04 MED ORDER — VITAL AF 1.2 CAL PO LIQD
1000.0000 mL | ORAL | Status: DC
  Administered 2018-09-04 – 2018-09-10 (×7): 1000 mL
  Filled 2018-09-04 (×2): qty 1000

## 2018-09-04 MED ORDER — IPRATROPIUM-ALBUTEROL 0.5-2.5 (3) MG/3ML IN SOLN
3.0000 mL | Freq: Three times a day (TID) | RESPIRATORY_TRACT | Status: DC
  Administered 2018-09-04 – 2018-09-10 (×19): 3 mL via RESPIRATORY_TRACT
  Filled 2018-09-04 (×20): qty 3

## 2018-09-04 MED ORDER — VANCOMYCIN HCL 10 G IV SOLR: 1500.0000 mg | Freq: Once | INTRAVENOUS | Status: DC

## 2018-09-04 MED ORDER — VANCOMYCIN HCL 10 G IV SOLR
1250.0000 mg | Freq: Once | INTRAVENOUS | Status: AC
  Administered 2018-09-04: 1250 mg via INTRAVENOUS
  Filled 2018-09-04: qty 1250

## 2018-09-04 MED ORDER — SODIUM CHLORIDE 0.9 % IV SOLN
2.0000 g | Freq: Two times a day (BID) | INTRAVENOUS | Status: DC
  Administered 2018-09-04 – 2018-09-06 (×6): 2 g via INTRAVENOUS
  Filled 2018-09-04 (×7): qty 2

## 2018-09-04 MED ORDER — ISOSORB DINITRATE-HYDRALAZINE 20-37.5 MG PO TABS
2.0000 | ORAL_TABLET | Freq: Three times a day (TID) | ORAL | Status: DC
  Administered 2018-09-04 – 2018-09-05 (×3): 2 via ORAL
  Filled 2018-09-04 (×4): qty 2

## 2018-09-04 NOTE — Progress Notes (Signed)
Patient taken to CT and returned to 0W38 without complications. Vital signs stable throughout. RT will continue to monitor.

## 2018-09-04 NOTE — Progress Notes (Signed)
Brief progress note: I called and spoke with Dr. Erlinda Hong, neuro He recommends a repeat CT head without to ensure stable or resolving bleed prior to resuming  baby asa . CT Ordered.  If stable should be ok to resume baby asa per neuro.  Magdalen Spatz, AGACNP-BC [place] Pager # [phone] 09/04/2018 10:10 AM

## 2018-09-04 NOTE — Progress Notes (Addendum)
[place] Progress Note Patient Name: Jeremiah [name] DOB: 1945/05/25 MRN: [id number]   Date of Service  09/04/2018  HPI/Events of Note  Notified by RN regarding blood culture results obtained from [facility] on 08/31/18. (+) methicillin resistance but Staph aureus not detected.  This was clarified by pharmacy that the new growth seen in this culture bottle is gram positive rod, which is likely a contaminant as well.  eICU Interventions  Discontinue Vancomycin order. Repeat blood cultures.  CBC and BMP ordered.     Intervention Category Minor Interventions: Other:  Elsie Lincoln 09/04/2018, 5:20 AM

## 2018-09-04 NOTE — Progress Notes (Signed)
CRITICAL VALUE ALERT  Critical Value:  Aerobic bottle-Gram Positive Cocci, Anaerobic bottle-Gram positive Rods  Date & Time Notied:  06/06/18 @0445   Provider Notified: E-Link  Orders Received/Actions taken: awaiting orders

## 2018-09-04 NOTE — Progress Notes (Signed)
Nutrition Follow-up  DOCUMENTATION CODES:   Not applicable  INTERVENTION:   To better meet re-estimated needs, change TF:   Vital AF 1.2 at 65 ml/h (1560 ml per day)   D/C Pro-stat    Provides 1872 kcal, 117 gm protein, 1265 ml free water daily  NUTRITION DIAGNOSIS:   Inadequate oral intake related to inability to eat as evidenced by NPO status.  Ongoing  GOAL:   Patient will meet greater than or equal to 90% of their needs  Met with TF  MONITOR:   Vent status, Labs, Weight trends, I & O's, TF tolerance  ASSESSMENT:   73 year old male who presented to the ED on 5/18 with SOB. PMH of COPD, HTN, PTSD. Pt found to have Takotsubo cardiomyopathy/acute myocarditis, small LLL PE, and small left front lobe hemorrhage. Pt intubated in the ED. Pt was extubated to nasal cannula on 5/19. Pt reintubated later in the day on 5/19 for agitation and possible stridor.  Patient remains intubated on ventilator support MV: 8.7 L/min Temp (24hrs), Avg:100.2 F (37.9 C), Min:99.5 F (37.5 C), Max:101.1 F (38.4 C)   Labs reviewed. CBG's: [phone] Medications reviewed and include K Phos. Propofol is currently off.   Cortrak tube was placed by diagnostic radiology 5/20. Cortrak RD placed bridle today. Patient is currently receiving Vital AF 1.2 at 45 ml/h with Prostat 60 ml BID. Tolerating TF without difficulty.  S/P head CT today, no changes compared to CT on 5/20 per Radiology report.  Weight trending down. I/O + 897 ml since admission.  Diet Order:   Diet Order            Diet NPO time specified  Diet effective now              EDUCATION NEEDS:   No education needs have been identified at this time  Skin:  Skin Assessment: Reviewed RN Assessment  Last BM:  5/20 (type 7)  Height:   Ht Readings from Last 1 Encounters:  08/31/18 _0  (1.905 m)    Weight:   Wt Readings from Last 1 Encounters:  09/04/18 69.8 kg    Ideal Body Weight:  89.1 kg  BMI:   Body mass index is 19.23 kg/m.  Estimated Nutritional Needs:   Kcal:  1960  Protein:  100-115 grams  Fluid:  >/= 1.8 L    Molli Barrows, RD, LDN, [place] Pager [phone] After Hours Pager [phone]

## 2018-09-04 NOTE — Progress Notes (Signed)
PT Cancellation Note  Patient Details Name: Jeremiah [name] MRN: [id number] DOB: 11-Feb-1946   Cancelled Treatment:    Reason Eval/Treat Not Completed: Active bedrest order;Medical issues which prohibited therapy(pt remains on vent and bedrest. Will sign off and await new order)   Nasteho Glantz B Nelli Swalley 09/04/2018, 7:04 AM  Elwyn Reach, PT Acute Rehabilitation Services Pager: [phone] Office: [phone]

## 2018-09-04 NOTE — Progress Notes (Signed)
Pt blood pressure in the 40G systolic. Propofol paused, attempted to stimulate pt with no response. MD notified. 500cc bolus of LR ordered. Will continue to monitor.

## 2018-09-04 NOTE — Progress Notes (Signed)
OT Cancellation Note  Patient Details Name: Jeremiah [name] MRN: [id number] DOB: 1945-08-02   Cancelled Treatment:    Reason Eval/Treat Not Completed: Patient not medically ready;Active bedrest order, remains sedated on vent. Will sign off and await new order.  Malka So 09/04/2018, 10:25 AM  Nestor Lewandowsky, OTR/L Acute Rehabilitation Services Pager: [phone] Office: [phone]

## 2018-09-04 NOTE — Evaluation (Addendum)
SLP Cancellation Note  Patient Details Name: Jeremiah [name] MRN: [id number] DOB: April 29, 1945   Cancelled treatment:       Reason Eval/Treat Not Completed: Other (comment)  Pt remains on vent, please reorder when pt is off vent.     Macario Golds 09/04/2018, 7:16 AM  Luanna Salk, MS [facility] SLP Acute Rehab Services Pager [phone] Office [phone]

## 2018-09-04 NOTE — Progress Notes (Signed)
Cortrak Tube Team Note:  Cortrak placed by diagnostic radiology on 5/20 and secured with tape. Cortrak team replaced tape with bridle, secured at 103 cm marking in right nare.   Mariana Single RD, LDN Clinical Nutrition Pager # [phone]

## 2018-09-04 NOTE — Progress Notes (Signed)
NAME:  Jeremiah [name], MRN:  [id number], DOB:  10-30-1945, LOS: 4 ADMISSION DATE:  08/31/2018, CONSULTATION DATE:  08/31/18 REFERRING MD:  [place] - [facility]  CHIEF COMPLAINT:  SOB   Brief History   Jeremiah [name] is a 73 y.o. male who presented to [facility] with SOB felt to be due to AECOPD.  Required intubation after failing NIMV.  Incidentally found to have small LLL PE and small 52mm left frontal lobe hemorrhage; therefore, transferred to [facility].  History of present illness   Pt is encephelopathic; therefore, this HPI is obtained from chart review. Jeremiah [name] is a 73 y.o. male who has a PMH as outlined below including Gold III COPD for which he is followed by Dr. Chase Caller (see "past medical history").  He presented to [facility] 5/18 with SOB.  He was placed on NIMV but failed and required intubation.  In ED, had significant bronchospasm, felt to be due to AECOPD. Per wife, SOB had began 1 week prior and progressively worsened.    CTA chest with incidental finding small LLL PE.  CT head with incidental finding small 56mm left frontal lobe hemorrhage.  Heparin deferred in lieu of this.  COVID negative.  In addition, EKG demonstrated J point elevation.  Discussed with cardiology who recommended trending troponins.  No emergent procedure indicated.  Pt transferred to [facility] for further evaluation and management.  Past Medical History  COPD, OSA, HTN, PTSD, Depression.  Significant Hospital Events   5/18 > admit.  Consults:  None.  Procedures:  ETT 5/18 >   Significant Diagnostic Tests:  CXR 5/18 > COPD. CT head 5/18 > 59mm subcortical hemorrhage in left frontal lobe.  Chronic white matter disease, question amyloid angiopathy. CTA chest 5/18 > single subsegmental PE in LLL.  Emphysema.  LE duplex 5/18 > negative for DVT. Echo 5/19 > EF 20-25%, stress cardiomopathy. MRI brain 5/20 > Acute 55mm hemorrhage, scattered small hemorrhages consistent with hypertension or amyloid angiopathy.  Micro Data:  Blood 5/18  > neg Sputum 5/18 > neg RVP 5/18 > neg SARS CoV2 5/18 > neg  Antimicrobials:  Azithromycin 5/18 x 1 Ceftriaxone 5/18 > 5/25  Interim history/subjective:  Calm overnight. IV sedation discontinued. Tolerated 2h SBT today but not alert enough for extubation.  Objective:  Blood pressure 124/78, pulse 96, temperature 100.2 F (37.9 C), temperature source Bladder, resp. rate 15, height 6\' 3"  (1.905 m), weight 70.7 kg, SpO2 100 %.    Vent Mode: PRVC FiO2 (%):  [40 %] 40 % Set Rate:  [12 bmp] 12 bmp Vt Set:  [670 mL] 670 mL PEEP:  [5 cmH20] 5 cmH20 Pressure Support:  [10 cmH20] 10 cmH20 Plateau Pressure:  [13 cmH20-18 cmH20] 17 cmH20   Intake/Output Summary (Last 24 hours) at 09/04/2018 0550 Last data filed at 09/04/2018 0500 Gross per 24 hour  Intake 1600.09 ml  Output 2695 ml  Net -1094.91 ml   Filed Weights   09/01/18 0500 09/02/18 0500 09/03/18 0451  Weight: 76.3 kg 72.8 kg 70.7 kg    Examination:  General: Adult male, in NAD.  Shivering. Neuro: Sedated off propofol. PERL, disconjugate gaze. Winces to pain.  HEENT: [place]/AT. Sclerae anicteric.  ETT in place. OGT in place. Cardiovascular: RRR, no M/R/G.  Lungs: Respirations even and unlabored.  CTA bilaterally, No W/R/R. No dyssynchrony. Abdomen: BS x 4, soft, NT/ND.  Musculoskeletal: No gross deformities, no edema.  Skin: Intact, warm, no rashes.  Assessment & Plan:   Critically ill due to acute respiratory  failure requiring mechanical ventilation. Decompensation due to possible stridor and agitation leading to increasing work of breathing - Ready for extubation from lung mechanics standpoint  - Mental status needs to improve.  AECOPD - required intubation after failing NIMV. - Continue current bronchodilators - Stop ceftriaxone at 7 days.  Stress cardiomyopathy with EF 25 %. Optimized heart failure will help extubation. -Introduce GDHFT Bidil tid - Diuretic trial  - If BP allows, add beta-blocker.  Small LLL PE.  - Assess LE duplex, echo. - Defer heparin given small left frontal lobe hemorrhage.  Small left frontal lobe hemorrhage. Hypertension related. - Repeat imaging today to assess expansion. - If stable can consider restarting anti-platelet regimen.  Hx HTN, HLD. - Hold preadmission amlodipine, ASA. - Continue preadmission pravastatin.  Best Practice:  Diet: NPO. Restart tube feeds. Moderate nutritional risk. Pain/Anxiety/Delirium protocol (if indicated): Propofol gtt / Fentanyl PRN.  RASS goal 0 to -1. VAP protocol (if indicated): In place DVT prophylaxis: SCD's. GI prophylaxis: PPI. Glucose control: SSI. Mobility: Bedrest. Code Status: Full. Family Communication: Brother in Sports coach updated 5/18 and 5/19.  He is a physician and is relaying information to pt's spouse. Disposition: ICU.  CRITICAL CARE Performed by: Kipp Brood   Total critical care time: 40 minutes  Critical care time was exclusive of separately billable procedures and treating other patients.  Critical care was necessary to treat or prevent imminent or life-threatening deterioration.  Critical care was time spent personally by me on the following activities: development of treatment plan with patient and/or surrogate as well as nursing, discussions with consultants, evaluation of patient's response to treatment, examination of patient, obtaining history from patient or surrogate, ordering and performing treatments and interventions, ordering and review of laboratory studies, ordering and review of radiographic studies, pulse oximetry, re-evaluation of patient's condition and participation in multidisciplinary rounds.  Kipp Brood, MD [facility] ICU Physician [place]  Pager: [phone] Mobile: [phone] After hours: [phone].   09/04/2018, 5:50 AM

## 2018-09-04 NOTE — Progress Notes (Addendum)
NAME:  Jeremiah [name], MRN:  [id number], DOB:  10-29-1945, LOS: 4 ADMISSION DATE:  08/31/2018, CONSULTATION DATE:  08/31/18 REFERRING MD:  [facility] - [facility]  CHIEF COMPLAINT:  SOB   Brief History   Jeremiah [name] is a 73 y.o. male who presented to [facility] with SOB felt to be due to AECOPD.  Required intubation after failing NIMV.  Incidentally found to have small LLL PE and small 80mm left frontal lobe hemorrhage; therefore, transferred to [facility]  History of present illness   Pt is encephelopathic; therefore, this HPI is obtained from chart review. Jeremiah [name] is a 72 y.o. male who has a PMH as outlined below including Gold III COPD for which he is followed by Dr. Chase Caller (see "past medical history").  He presented to [facility] 5/18 with SOB.  He was placed on NIMV but failed and required intubation.  In ED, had significant bronchospasm, felt to be due to AECOPD. Per wife, SOB had began 1 week prior and progressively worsened.    CTA chest with incidental finding small LLL PE.  CT head with incidental finding small 79mm left frontal lobe hemorrhage.  Heparin deferred in lieu of this.  COVID negative.  In addition, EKG demonstrated J point elevation.  Discussed with cardiology who recommended trending troponins.  No emergent procedure indicated.  Pt transferred to [facility] for further evaluation and management.  Past Medical History  COPD, OSA, HTN, PTSD, Depression.  Significant Hospital Events   5/18 > admit.  Consults:  None.  Procedures:  ETT 5/18 >   Significant Diagnostic Tests:  CXR 5/18 > COPD. CT head 5/18 > 62mm subcortical hemorrhage in left frontal lobe.  Chronic white matter disease, question amyloid angiopathy. CTA chest 5/18 > single subsegmental PE in LLL.  Emphysema.  LE duplex 5/18 > negative for DVT. Echo 5/19 > EF 20-25%, stress cardiomopathy. MRI brain 5/20 > Acute 69mm hemorrhage, scattered small hemorrhages consistent with hypertension or amyloid angiopathy.  Micro Data:  Blood 5/18  > neg Sputum 5/18 > neg RVP 5/18 > neg SARS CoV2 5/18 > neg  Antimicrobials:  Azithromycin 5/18 x 1 Ceftriaxone 5/18 > 5/22 Cefepime 5/22>>> Vancomycin 5/22>>>  Interim history/subjective:  T Max 101.1>> BC x 2, sputum, urine Remains very sedated Failed wean 2/2 apnea ETT day 5  Objective:  Blood pressure (!) 132/93, pulse 86, temperature 99.9 F (37.7 C), resp. rate 13, height 6\' 3"  (1.905 m), weight 69.8 kg, SpO2 100 %.    Vent Mode: PRVC FiO2 (%):  [40 %] 40 % Set Rate:  [12 bmp] 12 bmp Vt Set:  [670 mL] 670 mL PEEP:  [5 cmH20] 5 cmH20 Plateau Pressure:  [13 cmH20-18 cmH20] 14 cmH20   Intake/Output Summary (Last 24 hours) at 09/04/2018 0901 Last data filed at 09/04/2018 0600 Gross per 24 hour  Intake 1339.67 ml  Output 2405 ml  Net -1065.33 ml   Filed Weights   09/02/18 0500 09/03/18 0451 09/04/18 0500  Weight: 72.8 kg 70.7 kg 69.8 kg    Examination:  General: Adult male, intubated and sedated  in NAD. Neuro: Sedated on propofol. PERRL, disconjugate gaze. grimaces to pain  HEENT: [place]/AT. Sclerae anicteric.  ETT secure and  in place. OGT secure and  in place. Cardiovascular: S1, S2, RRR, no M/R/G. , NSR per tele Lungs: Bilateral chest excursion, Respirations even and unlabored.  CTA bilaterally, No W/R/R. No dyssynchrony. Diminished per bases Abdomen: BS x 4, soft, NT/ND.  Musculoskeletal: No gross deformities, no edema.  Skin: Intact, warm, no rashes or lesions.  Assessment & Plan:   Acute respiratory failure requiring mechanical ventilation. Decompensation due to possible stridor and agitation leading to increasing work of breathing - Ready for extubation from lung mechanics standpoint  - Mental status needs to improve. Plan - Continue to wean sedation  - Continue to attempt SBT as mental status improves. - Wean FiO2 and PEEP as able - Day  5 ETT - Trend CXR  AECOPD - required intubation after failing NIMV.  - Continue current bronchodilators - Stop  ceftriaxone at 7 days. - Saturation goal is 88-92%  Fever and increase WBC 5/22 Rocephin since 5/18 Plan Blood Cx x 2 Urine for strep and legionella Sputum Cx Follow micro  Stress cardiomyopathy with EF 25 %. Optimized heart failure will help extubation. -Introduce GDHFT Bidil tid - HTN Plan - Diuretic trial  - If BP allows, add beta-blocker. - Will d/c ARB as creatinine has increased - Will increase dosing of Bidil  Increasing Creatinine 5/22 1.22 on admission>> 1.66 Plan BMET daily Replete electrolytes as needed Monitor Urine output Will d/c ARB  Small LLL PE. - Echo and Duplex doppler results noted - Defer heparin given small left frontal lobe hemorrhage.  Small left frontal lobe hemorrhage. No significant improvement in MS Hypertension related. Plan - Repeat imaging 5/20 shows stable small hemorrhage - Consider further imaging / EEG if MS does not improve   Hx HTN, HLD. - Add BIDIL. - Continue preadmission pravastatin. - Platelets 141,000>> monitor and resume ASA once WNL  Best Practice:  Diet: NPO. tube feeds. Moderate nutritional risk. Pain/Anxiety/Delirium protocol (if indicated): Propofol gtt / Fentanyl PRN.  RASS goal 0 to -1. VAP protocol (if indicated): In place DVT prophylaxis: SCD's. GI prophylaxis: PPI. Glucose control: SSI. Mobility: Bedrest. Code Status: Full. Family Communication: Brother in Sports coach updated  By nursing 5/22  He is a physician and is relaying information to pt's spouse. Disposition: ICU.  CRITICAL CARE APP Time Performed by: Magdalen Spatz   Total critical care time: 30 minutes   Magdalen Spatz, AGACNP-BC [place] Pager # [phone] After 4 pm please call [phone]  09/04/2018, 9:01 AM  Attending Note:  73 year old male with ES-COPD who presents with a COPD exacerbation, PE and small hemorrhagic CVA who presents to PCCM with respiratory failure.  Patient's BP improved now.   Patient is tolerating some weaning this AM with coarse BS on exam.  I reviewed CXR myself, ETT is in a good position.  Will call neuro back.  Will pan culture.  Expand abx to vanc and cefepime, d/c rocephin.  F/U on cultures.  PCCM will continue to manage.  The patient is critically ill with multiple organ systems failure and requires high complexity decision making for assessment and support, frequent evaluation and titration of therapies, application of advanced monitoring technologies and extensive interpretation of multiple databases.   Critical Care Time devoted to patient care services described in this note is  33  Minutes. This time reflects time of care of this signee Dr Jennet Maduro. This critical care time does not reflect procedure time, or teaching time or supervisory time of PA/NP/Med student/Med Resident etc but could involve care discussion time.  Rush Farmer, M.D. [facility] Pulmonary/Critical Care Medicine. Pager: [phone]. After hours pager: [phone].

## 2018-09-04 NOTE — Progress Notes (Signed)
Progress Note  Patient Name: Jeremiah [name] Date of Encounter: 09/04/2018  Primary Cardiologist: Sanda Klein, MD   Subjective   Remains intubated. Just had repeat CT - unchanged "small focus of subcortical hemorrhage within the L frontal lobe". Good UO after diuretics, but creatinine has increased further. Seems to tolerate the higher dose of bidil. New fever and increased WBC.  Inpatient Medications    Scheduled Meds: .  stroke: mapping our early stages of recovery book   Does not apply Once  . ALPRAZolam  0.5 mg Per Tube BID  . chlorhexidine gluconate (MEDLINE KIT)  15 mL Mouth Rinse BID  . Chlorhexidine Gluconate Cloth  6 each Topical Daily  . ipratropium-albuterol  3 mL Nebulization TID  . isosorbide-hydrALAZINE  2 tablet Oral TID  . mouth rinse  15 mL Mouth Rinse 10 times per day  . pantoprazole (PROTONIX) IV  40 mg Intravenous QHS  . pravastatin  10 mg Per Tube q1800  . QUEtiapine  25 mg Per Tube QHS  . senna-docusate  1 tablet Oral BID  . traZODone  50 mg Oral QHS   Continuous Infusions: . sodium chloride 10 mL/hr at 09/03/18 1800  . ceFEPime (MAXIPIME) IV 2 g (09/04/18 1100)  . feeding supplement (VITAL AF 1.2 CAL) 1,000 mL (09/04/18 1626)  . propofol (DIPRIVAN) infusion 40 mcg/kg/min (09/04/18 1702)  . [START ON 09/05/2018] vancomycin     PRN Meds: acetaminophen **OR** acetaminophen (TYLENOL) oral liquid 160 mg/5 mL **OR** acetaminophen, albuterol, fentaNYL (SUBLIMAZE) injection, haloperidol lactate   Vital Signs    Vitals:   09/04/18 1113 09/04/18 1119 09/04/18 1600 09/04/18 1700  BP:  (!) 149/128 103/81 108/78  Pulse:  (!) 123 92 (!) 106  Resp:   13 (!) 22  Temp: 99.5 F (37.5 C)     TempSrc:      SpO2:   100% 96%  Weight:      Height:        Intake/Output Summary (Last 24 hours) at 09/04/2018 1711 Last data filed at 09/04/2018 1600 Gross per 24 hour  Intake 1826.11 ml  Output 290 ml  Net 1536.11 ml   Last 3 Weights 09/04/2018 09/03/2018  09/02/2018  Weight (lbs) 153 lb 14.1 oz 155 lb 13.8 oz 160 lb 7.9 oz  Weight (kg) 69.8 kg 70.7 kg 72.8 kg      Telemetry    NSR/mild sinus tachycardia - Personally Reviewed  ECG    Sinus tachycardia, widespread broad T wave inversion, long QTc 498 ms - Personally Reviewed  Physical Exam  Intubated, sedated GEN: No acute distress.   Neck: No JVD Cardiac: RRR, no murmurs, rubs, or gallops.  Respiratory: scattered wheezing GI: Soft, nontender, non-distended  MS: No edema; No deformity. Neuro:  Nonfocal  Psych: Normal affect   Labs    Chemistry Recent Labs  Lab 08/31/18 0458  09/03/18 0930 09/03/18 1140 09/04/18 0552  NA 138   < > 144 143 145  K 3.8   < > 5.6* 3.3* 3.6  CL 103   < > 105 105 105  CO2 22   < > 20* 26 29  GLUCOSE 200*   < > 90 112* 123*  BUN 17   < > 25* 27* 36*  CREATININE 1.22   < > 1.23 1.33* 1.66*  CALCIUM 10.0   < > 9.4 9.2 9.3  PROT 8.3*  --   --   --   --   ALBUMIN 4.6  --   --   --   --  AST 34  --   --   --   --   ALT 14  --   --   --   --   ALKPHOS 85  --   --   --   --   BILITOT 1.0  --   --   --   --   GFRNONAA 60*   < > 59* 54* 41*  GFRAA >60   < > >60 >60 48*  ANIONGAP 13   < > 19* 12 11   < > = values in this interval not displayed.     Hematology Recent Labs  Lab 09/01/18 0253  09/01/18 2134 09/02/18 0431 09/04/18 0552  WBC 12.3*  --   --  13.8* 15.9*  RBC 5.38  --   --  5.24 5.14  HGB 16.6   < > 14.3 16.0 15.7  HCT 50.0   < > 42.0 49.9 47.6  MCV 92.9  --   --  95.2 92.6  MCH 30.9  --   --  30.5 30.5  MCHC 33.2  --   --  32.1 33.0  RDW 12.6  --   --  12.7 12.8  PLT 201  --   --  156 141*   < > = values in this interval not displayed.    Cardiac Enzymes Recent Labs  Lab 08/31/18 0458 08/31/18 0952 08/31/18 1536 09/01/18 1439  TROPONINI 0.04* 0.79* 1.91* 1.29*   No results for input(s): TROPIPOC in the last 168 hours.   BNP Recent Labs  Lab 08/31/18 0459  BNP 285.0*     DDimer No results for input(s):  DDIMER in the last 168 hours.   Radiology    Ct Head Wo Contrast  Result Date: 09/04/2018 CLINICAL DATA:  Intracranial hemorrhage follow up EXAM: CT HEAD WITHOUT CONTRAST TECHNIQUE: Contiguous axial images were obtained from the base of the skull through the vertex without intravenous contrast. COMPARISON:  Head CT 09/02/2018 FINDINGS: Brain: Unchanged small focus of parenchymal hemorrhage within the subcortical left frontal lobe, measuring 6 mm. No associated mass effect. No new site of hemorrhage. Confluent hypoattenuation of the periventricular white matter consistent with chronic small vessel ischemia. Vascular: No abnormal hyperdensity of the major intracranial arteries or dural venous sinuses. No intracranial atherosclerosis. Skull: The visualized skull base, calvarium and extracranial soft tissues are normal. Sinuses/Orbits: No fluid levels or advanced mucosal thickening of the visualized paranasal sinuses. No mastoid or middle ear effusion. The orbits are normal. IMPRESSION: 1. Unchanged small focus of subcortical hemorrhage within the left frontal lobe. No midline shift or mass effect. 2. Severe chronic ischemic changes of the white matter. Electronically Signed   By: Ulyses Jarred M.D.   On: 09/04/2018 14:44    Cardiac Studies   Echocardiogram 08/31/2018: Impressions: 1. The left ventricle has severely reduced systolic function, with an ejection fraction of 20-25%. The cavity size was normal. There is mildly increased left ventricular wall thickness. Findings are consistent with Takatsubo cardiomyopathy or  multivessel CAD. Left ventricular diastolic Doppler parameters are consistent with pseudonormalization. Elevated left atrial and left ventricular end-diastolic pressures The E/e' is >15. Left ventricular diffuse hypokinesis. 2. The right ventricle has moderately reduced systolic function. The cavity was normal. There is mildly increased right ventricular wall thickness. 3. The mitral  valve is abnormal. Mild thickening of the mitral valve leaflet. There is mild mitral annular calcification present. 4. The tricuspid valve is grossly normal. 5. The aortic valve appears bicuspid. Mild sclerosis  of the aortic valve. Aortic valve regurgitation is trivial by color flow Doppler. No stenosis of the aortic valve. 6. The inferior vena cava was dilated in size with <50% respiratory variability.  Summary: LVEF 20-25%, mild LVH, severe apical hypokinesis with relatively preserved base -may suggest Taksubo cardiomyopathy or multivessel coronary disease, grade 2 DD with elevated LV filling pressure, moderate RV systolic dysfunction, normal biatrial size, possibly bicuspid aortic valve, MAC with trivial MR, trivial TR, RVSP 38 mmHg, dilated IVC.  Patient Profile     73 y.o. male with a history of hypertension, COPD, and obstructive sleep apnea with AECOPD requiring mechanical ventilation, who is being seen  for evaluation of new LV systolic dysfunction, abnormal EKG, and elevated troponin; incidentally found to have a small subsegmental PE and intracranial bleed. Extubated and reintubated 09/01/2018.  Assessment & Plan    1. CMP/CHF: etiology uncertain (echo c/w takotsubo sd., but could also be myopericarditis based on diffuse transient ST elevation on ECG or multivessel coronary ischemia). When he recovers may need a cath. Plan repeat echo on 05/26, one week after the initial study. Continue Bidil. Hold off on RAAS inh and additional diuresis due to worsening renal parameters. 2. AECOPD/acute on chronic resp failure:  Good ventilatory parameters and oxygenation, but poor alertness delaying weaning. 3. Small subsegmental PE: uncertain clinical significance. Not anticoagulated due to [place]. 4. ICH: stable 5 mm L frontal ICH on repeat CT today.       For questions or updates, please contact [place] Please consult [email or web address] for contact info under        Signed, Sanda Klein, MD  09/04/2018, 5:11 PM

## 2018-09-04 NOTE — Progress Notes (Signed)
STROKE TEAM PROGRESS NOTE   SUBJECTIVE (INTERVAL HISTORY) His RN is at the bedside. Pt had fever overnight, with worsening leukocytosis. He still intubated on propofol, put on cefepime. Still withdraw extremities on pain stimulation except RUE.    OBJECTIVE Temp:  [99.5 F (37.5 C)-101.1 F (38.4 C)] 99.5 F (37.5 C) (05/22 1113) Pulse Rate:  [84-123] 123 (05/22 1119) Cardiac Rhythm: Normal sinus rhythm (05/22 0800) Resp:  [12-25] 13 (05/22 0900) BP: (58-149)/(45-128) 149/128 (05/22 1119) SpO2:  [95 %-100 %] 100 % (05/22 0900) FiO2 (%):  [40 %] 40 % (05/22 1120) Weight:  [69.8 kg] 69.8 kg (05/22 0500)  Recent Labs  Lab 09/03/18 1911 09/03/18 2314 09/04/18 0354 09/04/18 0714 09/04/18 1110  GLUCAP 71 87 74 91 117*   Recent Labs  Lab 09/02/18 0431 09/02/18 1009 09/02/18 1645 09/03/18 0544 09/03/18 0930 09/03/18 1140 09/03/18 1642 09/04/18 0552  NA 143  --  142  --  144 143  --  145  K 4.8  --  3.7  --  5.6* 3.3*  --  3.6  CL 110  --  104  --  105 105  --  105  CO2 19*  --  28  --  20* 26  --  29  GLUCOSE 104*  --  159*  --  90 112*  --  123*  BUN 19  --  18  --  25* 27*  --  36*  CREATININE 1.01  --  1.22  --  1.23 1.33*  --  1.66*  CALCIUM 9.2  --  9.4  --  9.4 9.2  --  9.3  MG 2.3 2.3 2.5* 2.6*  --   --  2.4  --   PHOS 3.3 3.3 3.3 3.2  --   --  2.5  --    Recent Labs  Lab 08/31/18 0458  AST 34  ALT 14  ALKPHOS 85  BILITOT 1.0  PROT 8.3*  ALBUMIN 4.6   Recent Labs  Lab 08/31/18 0458 09/01/18 0253 09/01/18 0340 09/01/18 2134 09/02/18 0431 09/04/18 0552  WBC 13.1* 12.3*  --   --  13.8* 15.9*  NEUTROABS 9.6*  --   --   --   --  12.5*  HGB 17.9* 16.6 16.0 14.3 16.0 15.7  HCT 54.4* 50.0 47.0 42.0 49.9 47.6  MCV 93.3 92.9  --   --  95.2 92.6  PLT 262 201  --   --  156 141*   Recent Labs  Lab 08/31/18 0458 08/31/18 0952 08/31/18 1536 09/01/18 1439  TROPONINI 0.04* 0.79* 1.91* 1.29*   No results for input(s): LABPROT, INR in the last 72  hours. No results for input(s): COLORURINE, LABSPEC, [place], GLUCOSEU, HGBUR, BILIRUBINUR, KETONESUR, PROTEINUR, UROBILINOGEN, NITRITE, LEUKOCYTESUR in the last 72 hours.  Invalid input(s): APPERANCEUR     Component Value Date/Time   CHOL 159 08/31/2018 1830   TRIG 109 09/04/2018 0552   HDL 66 08/31/2018 1830   CHOLHDL 2.4 08/31/2018 1830   VLDL 10 08/31/2018 1830   LDLCALC 83 08/31/2018 1830   Lab Results  Component Value Date   HGBA1C 5.6 08/31/2018      Component Value Date/Time   LABOPIA NONE DETECTED 08/31/2018 2049   COCAINSCRNUR NONE DETECTED 08/31/2018 2049   LABBENZ POSITIVE (A) 08/31/2018 2049   AMPHETMU NONE DETECTED 08/31/2018 2049   THCU NONE DETECTED 08/31/2018 2049   LABBARB NONE DETECTED 08/31/2018 2049    No results for input(s): ETH in the last 168  hours.   IMAGING  I have personally reviewed the radiological images below and agree with the radiology interpretations.  Ct Head Wo Contrast 09/02/2018 IMPRESSION:  1. Unchanged small hemorrhage in the posterior left frontal lobe.  2. Unchanged focal density in a left parietal sulcus which may be vascular or indicative of trace subarachnoid hemorrhage.  3. No new intracranial hemorrhage.  4. Severe chronic small vessel ischemic disease.   Ct Head Wo Contrast 08/31/2018  IMPRESSION:  1. 5 mm acute subcortical hemorrhage in the left frontal lobe. Extensive chronic white matter disease, question amyloid angiopathy given this location.  2. Attention to a minimally high-dense left parietal sulcus on follow-up.   Ct Angio Chest Pe W And/or Wo Contrast 08/31/2018 IMPRESSION:  1. Single subsegmental pulmonary embolism to the left lower lobe.  2. Coronary atherosclerosis.  3. Emphysema.    Mr Jeremiah [name] Head Wo Contrast 09/02/2018 IMPRESSION:   MRI head:  1. 5 mm focus of acute hemorrhage in the left frontal lobe is associated with a small subcortical acute/early subacute infarction. Additional punctate foci of  acute/early subacute infarction are present in the left anterior frontal lobe and the left temporal lobe.  2. Numerous foci of chronic hemorrhage throughout the brain in a predominant peripheral distribution as well as superficial siderosis over the left-greater-than-right cerebral convexities. The peripheral distribution favors amyloid angiopathy over chronic sequelae of hypertension.  3. Severe chronic microvascular ischemic changes and mild to moderate volume loss of the brain.   MRA head:  Patent anterior and posterior intracranial circulation. No large vessel occlusion, aneurysm, or significant stenosis is identified.   Vas [place] Lower Extremity Venous (dvt) 08/31/2018 Summary:  Right: There is no evidence of deep vein thrombosis in the lower extremity. No cystic structure found in the popliteal fossa.  Left: There is no evidence of deep vein thrombosis in the lower extremity. No cystic structure found in the popliteal fossa.   EEG 09/03/2018 This is a technically difficult electroencephalogram due to the presence of muscle and movement artifact.  The portion able to be evaluated was abnormal  due to general background slowing and the presence of triphasic waves.  These are seen most commonly in encephalopathic states.  An influence of medication effect is likely present as well.    CT head pending   PHYSICAL EXAM  Temp:  [99.5 F (37.5 C)-101.1 F (38.4 C)] 99.5 F (37.5 C) (05/22 1113) Pulse Rate:  [84-123] 123 (05/22 1119) Resp:  [12-25] 13 (05/22 0900) BP: (58-149)/(45-128) 149/128 (05/22 1119) SpO2:  [95 %-100 %] 100 % (05/22 0900) FiO2 (%):  [40 %] 40 % (05/22 1120) Weight:  [69.8 kg] 69.8 kg (05/22 0500)  General - Well nourished, well developed, intubated on sedation.  Ophthalmologic - fundi not visualized due to noncooperation.  Cardiovascular - Regular rhythm but tachycardia.  Neuro - intubated on sedation, eyes closed, not following commands, but with pain  stimulation, he opens eyes but no tracking not blinking to visual threat. Eyes in mid position, doll's eyes very sluggish and delayed, pupil equal bilaterally, reactive to light. Corneal reflex present bilaterally, gag and cough present and strong. Breathing over the vent on weaning trials.  Facial symmetry not able to test due to ET tube.  Tongue midline in mouth. On pain stimulation, withdrawal in BLEs and LUE extremities, however no significant movement of RUE. DTR diminished and no babinski. Sensation, coordination and gait not tested.   ASSESSMENT/PLAN Jeremiah [name] is a 73 y.o. male with history  of COPD, HTN, OSA admitted for SOB. No tPA given due to [place].    Encephalopathy with agitation  Not sure about baseline mental status, was reported to have baseline dementia  Extubation -> re-intubated  Not able to be explained by the punctate infarcts and micro bleeds on CT and MRI  CCM on board  EEG - encephalopathic state, no seizure  UA showed UTI on rocephin -> cefepime  Overnight fever, Tmax 101.1, Abx changed to vanco and cefepime   Hypotension resolved, off pressor  On Xanax and seroquel  blood culture 1/2 positive coagulase-negative likely contamination - repeat blood culture pending  CAA with punctate infarcts and microbleeds as well as left hemisphere hemosiderosis  MRI left frontal punctate infarcts and tiny ICH with hemosiderosis, consistent with CAA  MRA no AVM or aneurysm  CT repeat pending  LE venous doppler negative for DVT  2D Echo  EF 20-25%  LDL 83  HgbA1c 5.6  UDS + for benzo  SCDs for VTE prophylaxis  aspirin 81 mg daily prior to admission, now on No antithrombotic due to [place] and CAA. Given PE and punctate infarcts, may consider ASA 81 if CT repeat showed resolving ICH.  Ongoing aggressive stroke risk factor management  Therapy recommendations:  Therapists signed off - pt not medically ready.  Disposition:  Pending   PE at LLL  CT chest  showed small LLL segmental PE  LE venous doppler no DVT  Not on AC due to small ICH and CAA  MRI consistent with CAA  Given likely CAA, no AC recommended.  However if pt is indicated for [facility], will recommend heparin IV with no bolus and heparin level 0.3-0.5 and close neuro check. If any neuro changes, please repeat CT head stat.   Cardiomyopathy with elevated troponin  Cardiology on board  EF 20 to 25%  Troponin elevated 0.79->1.91->1.29  Concerning for Takatsubo cardiomyopathy  Cards recommend ischemic work up once stable  Discussed with Dr. Sallyanne Kuster and no indication for [facility] at this time.   On Lasix and BiDil  Hypoglycemia, improving  Glucose 71-101-65-51-71->59->88->91->119  Received D50 on 09/01/2018  Off IVF  On tube feeding  Respiratory failure   Intubated on admission  Extubated -> reintubated  On vent  CCM on board  CXR clear 09/01/18  Fever with Tmax 101.2  Rocephin -> cefepime and vanco   Hypotension, improving Hx of hypertension . BP fluctuating  . CCM on board  BP goal < 160 for now  Close monitoring  Hyperlipidemia  Home meds:  pravastatin 10  LDL 83, goal < 70  Pravastatin 10 resumed  Continue statin on discharge  UTI, fever with leukocytosis  UA WBC > 50  Fever Tmax 101.2  Leukocytosis -WBC 13.8->15.9   On rocephin   Other Stroke Risk Factors  Advanced age  Obstructive sleep apnea, on CPAP at home  Other Active Problems  COPD  Mg - 2.6->2.4  AKI - Creatinine - 1.23->1.33->1.66  Hospital day # 4  This patient is critically ill due to CAA and ICH, respiratory failure, fever, leukocytosis, cardiomyopathy, PE, hypoglycemia, hypotension and at significant risk of neurological worsening, death form shock, hematoma expansion, brain edema, heart failure, seizure. This patient's care requires constant monitoring of vital signs, hemodynamics, respiratory and cardiac monitoring, review of multiple databases, neurological  assessment, discussion with family, other specialists and medical decision making of high complexity. I spent 35 minutes of neurocritical care time in the care of this patient.  Rosalin Hawking, MD  PhD Stroke Neurology 09/04/2018 5:28 PM   To contact Stroke Continuity provider, please refer to [email or web address] After hours, contact General Neurology

## 2018-09-04 NOTE — Progress Notes (Signed)
Pharmacy Antibiotic Note  Jeremiah [name] is a 73 y.o. male admitted to [facility] on 08/31/2018 with SOB and was pale and clammy.  Patient transferred to [facility] for further management.  She was on Rocephin for COPD exacerbation.  Now with fever and leukocytosis, so  Pharmacy has been consulted for vancomycin and cefepime dosing for PNA.  Patient has AKI, so will utilize trough dosing for vancomycin.  SCr 1.66, CrCL 41 ml/min, Tmax 100.6, WBC up to 15.9, last lactic acid was elevated at 4.9.  Plan: Vanc 1250mg  IV x 1, then 1gm IV Q24H for trough 15-20 mcg/mL Cefepime 2gm IV Q12H Monitor renal fxn, micro data, vanc trough as indicated   Height: 6\' 3"  (190.5 cm) Weight: 153 lb 14.1 oz (69.8 kg) IBW/kg (Calculated) : 84.5  Temp (24hrs), Avg:100.2 F (37.9 C), Min:99.5 F (37.5 C), Max:101.1 F (38.4 C)  Recent Labs  Lab 08/31/18 0458 08/31/18 0459 08/31/18 0646 08/31/18 0952 08/31/18 1204 09/01/18 0253 09/02/18 0431 09/02/18 1645 09/03/18 0930 09/03/18 1140 09/04/18 0552  WBC 13.1*  --   --   --   --  12.3* 13.8*  --   --   --  15.9*  CREATININE 1.22  --   --   --   --  1.39* 1.01 1.22 1.23 1.33* 1.66*  LATICACIDVEN  --  1.6 4.8* 4.4* 4.9*  --   --   --   --   --   --     Estimated Creatinine Clearance: 40.9 mL/min (A) (by C-G formula based on SCr of 1.66 mg/dL (H)).    No Known Allergies  CTX 5/18 >> 5/22 Azithro 5/18 x 1 Vanc 5/22 >> Cefepime 5/22 >>  5/18 covid - negative 5/18 MRSA PCR - neg 5/18 resp panel PCR - neg 5/18 BCx - 1/2 [facility]PC (BCID 1/4 CoNS, likely contaminant) 5/22 BCx - 5/22 TA -   Landree Fernholz D. Mina Marble, PharmD, BCPS, [place] 09/04/2018, 10:01 AM

## 2018-09-05 ENCOUNTER — Inpatient Hospital Stay: Payer: Self-pay

## 2018-09-05 ENCOUNTER — Inpatient Hospital Stay (HOSPITAL_COMMUNITY): Payer: Medicare Other

## 2018-09-05 DIAGNOSIS — I2699 Other pulmonary embolism without acute cor pulmonale: Secondary | ICD-10-CM | POA: Diagnosis present

## 2018-09-05 LAB — CULTURE, BLOOD (ROUTINE X 2)
Culture: NO GROWTH
Special Requests: ADEQUATE
Special Requests: ADEQUATE

## 2018-09-05 LAB — GLUCOSE, CAPILLARY
Glucose-Capillary: 103 mg/dL — ABNORMAL HIGH (ref 70–99)
Glucose-Capillary: 108 mg/dL — ABNORMAL HIGH (ref 70–99)
Glucose-Capillary: 113 mg/dL — ABNORMAL HIGH (ref 70–99)
Glucose-Capillary: 117 mg/dL — ABNORMAL HIGH (ref 70–99)
Glucose-Capillary: 123 mg/dL — ABNORMAL HIGH (ref 70–99)
Glucose-Capillary: 126 mg/dL — ABNORMAL HIGH (ref 70–99)

## 2018-09-05 LAB — CBC
HCT: 47.9 % (ref 39.0–52.0)
Hemoglobin: 15.8 g/dL (ref 13.0–17.0)
MCH: 30.8 pg (ref 26.0–34.0)
MCHC: 33 g/dL (ref 30.0–36.0)
MCV: 93.4 fL (ref 80.0–100.0)
Platelets: 123 10*3/uL — ABNORMAL LOW (ref 150–400)
RBC: 5.13 MIL/uL (ref 4.22–5.81)
RDW: 12.9 % (ref 11.5–15.5)
WBC: 19.5 10*3/uL — ABNORMAL HIGH (ref 4.0–10.5)
nRBC: 0 % (ref 0.0–0.2)

## 2018-09-05 LAB — BASIC METABOLIC PANEL
Anion gap: 13 (ref 5–15)
BUN: 42 mg/dL — ABNORMAL HIGH (ref 8–23)
CO2: 24 mmol/L (ref 22–32)
Calcium: 9.1 mg/dL (ref 8.9–10.3)
Chloride: 107 mmol/L (ref 98–111)
Creatinine, Ser: 1.34 mg/dL — ABNORMAL HIGH (ref 0.61–1.24)
GFR calc Af Amer: >60 mL/min (ref >60)
GFR calc non Af Amer: 53 mL/min — ABNORMAL LOW (ref >60)
Glucose, Bld: 127 mg/dL — ABNORMAL HIGH (ref 70–99)
Potassium: 3.8 mmol/L (ref 3.5–5.1)
Sodium: 144 mmol/L (ref 135–145)

## 2018-09-05 LAB — MAGNESIUM: Magnesium: 2.5 mg/dL — ABNORMAL HIGH (ref 1.7–2.4)

## 2018-09-05 LAB — PROCALCITONIN: Procalcitonin: 0.4 ng/mL

## 2018-09-05 LAB — LACTIC ACID, PLASMA: Lactic Acid, Venous: 3.7 mmol/L (ref 0.5–1.9)

## 2018-09-05 MED ORDER — ISOSORB DINITRATE-HYDRALAZINE 20-37.5 MG PO TABS
2.0000 | ORAL_TABLET | Freq: Three times a day (TID) | ORAL | Status: DC
  Administered 2018-09-05 – 2018-09-06 (×3): 2
  Filled 2018-09-05 (×6): qty 2

## 2018-09-05 MED ORDER — POTASSIUM CHLORIDE 20 MEQ/15ML (10%) PO SOLN
40.0000 meq | Freq: Three times a day (TID) | ORAL | Status: AC
  Administered 2018-09-05 (×2): 40 meq
  Filled 2018-09-05 (×2): qty 30

## 2018-09-05 MED ORDER — METOLAZONE 5 MG PO TABS
10.0000 mg | ORAL_TABLET | Freq: Once | ORAL | Status: AC
  Administered 2018-09-05: 10 mg via ORAL
  Filled 2018-09-05 (×2): qty 2
  Filled 2018-09-05 (×2): qty 1

## 2018-09-05 MED ORDER — ASPIRIN 81 MG PO CHEW
81.0000 mg | CHEWABLE_TABLET | Freq: Every day | ORAL | Status: DC
  Administered 2018-09-05 – 2018-09-21 (×16): 81 mg
  Filled 2018-09-05 (×16): qty 1

## 2018-09-05 MED ORDER — FUROSEMIDE 10 MG/ML IJ SOLN
40.0000 mg | Freq: Four times a day (QID) | INTRAMUSCULAR | Status: AC
  Administered 2018-09-05 (×3): 40 mg via INTRAVENOUS
  Filled 2018-09-05 (×3): qty 4

## 2018-09-05 NOTE — Progress Notes (Signed)
Assisted tele visit to patient with wife.  Laporscha Linehan, Janace Hoard, RN

## 2018-09-05 NOTE — Progress Notes (Signed)
STROKE TEAM PROGRESS NOTE   SUBJECTIVE (INTERVAL HISTORY) His RN is at the bedside. Pt had fever overnight, with worsening leukocytosis. He still intubated on propofol, put on cefepime. Still withdraw extremities on pain stimulation except RUE.    OBJECTIVE Temp:  [99.4 F (37.4 C)-101.5 F (38.6 C)] 100.2 F (37.9 C) (05/23 1000) Pulse Rate:  [69-123] 113 (05/23 1000) Cardiac Rhythm: Normal sinus rhythm;Sinus tachycardia (05/23 0830) Resp:  [12-35] 29 (05/23 1000) BP: (65-151)/(51-128) 129/76 (05/23 1000) SpO2:  [86 %-100 %] 92 % (05/23 1000) FiO2 (%):  [40 %] 40 % (05/23 0831) Weight:  [73.4 kg] 73.4 kg (05/23 0343)  Recent Labs  Lab 09/04/18 1521 09/04/18 1922 09/04/18 2359 09/05/18 0349 09/05/18 0813  GLUCAP 119* 108* 103* 126* 117*   Recent Labs  Lab 09/02/18 0431 09/02/18 1009 09/02/18 1645 09/03/18 0544 09/03/18 0930 09/03/18 1140 09/03/18 1642 09/04/18 0552 09/05/18 0329  NA 143  --  142  --  144 143  --  145 144  K 4.8  --  3.7  --  5.6* 3.3*  --  3.6 3.8  CL 110  --  104  --  105 105  --  105 107  CO2 19*  --  28  --  20* 26  --  29 24  GLUCOSE 104*  --  159*  --  90 112*  --  123* 127*  BUN 19  --  18  --  25* 27*  --  36* 42*  CREATININE 1.01  --  1.22  --  1.23 1.33*  --  1.66* 1.34*  CALCIUM 9.2  --  9.4  --  9.4 9.2  --  9.3 9.1  MG 2.3 2.3 2.5* 2.6*  --   --  2.4  --  2.5*  PHOS 3.3 3.3 3.3 3.2  --   --  2.5  --   --    Recent Labs  Lab 08/31/18 0458  AST 34  ALT 14  ALKPHOS 85  BILITOT 1.0  PROT 8.3*  ALBUMIN 4.6   Recent Labs  Lab 08/31/18 0458 09/01/18 0253 09/01/18 0340 09/01/18 2134 09/02/18 0431 09/04/18 0552 09/05/18 0329  WBC 13.1* 12.3*  --   --  13.8* 15.9* 19.5*  NEUTROABS 9.6*  --   --   --   --  12.5*  --   HGB 17.9* 16.6 16.0 14.3 16.0 15.7 15.8  HCT 54.4* 50.0 47.0 42.0 49.9 47.6 47.9  MCV 93.3 92.9  --   --  95.2 92.6 93.4  PLT 262 201  --   --  156 141* 123*   Recent Labs  Lab 08/31/18 0458 08/31/18 0952  08/31/18 1536 09/01/18 1439  TROPONINI 0.04* 0.79* 1.91* 1.29*   No results for input(s): LABPROT, INR in the last 72 hours. No results for input(s): COLORURINE, LABSPEC, [place], GLUCOSEU, HGBUR, BILIRUBINUR, KETONESUR, PROTEINUR, UROBILINOGEN, NITRITE, LEUKOCYTESUR in the last 72 hours.  Invalid input(s): APPERANCEUR     Component Value Date/Time   CHOL 159 08/31/2018 1830   TRIG 109 09/04/2018 0552   HDL 66 08/31/2018 1830   CHOLHDL 2.4 08/31/2018 1830   VLDL 10 08/31/2018 1830   LDLCALC 83 08/31/2018 1830   Lab Results  Component Value Date   HGBA1C 5.6 08/31/2018      Component Value Date/Time   LABOPIA NONE DETECTED 08/31/2018 2049   COCAINSCRNUR NONE DETECTED 08/31/2018 2049   LABBENZ POSITIVE (A) 08/31/2018 2049   AMPHETMU NONE DETECTED 08/31/2018 2049  THCU NONE DETECTED 08/31/2018 2049   LABBARB NONE DETECTED 08/31/2018 2049    No results for input(s): ETH in the last 168 hours.   IMAGING  I have personally reviewed the radiological images below and agree with the radiology interpretations.  Ct Head Wo Contrast 09/02/2018 IMPRESSION:  1. Unchanged small hemorrhage in the posterior left frontal lobe.  2. Unchanged focal density in a left parietal sulcus which may be vascular or indicative of trace subarachnoid hemorrhage.  3. No new intracranial hemorrhage.  4. Severe chronic small vessel ischemic disease.   Ct Head Wo Contrast 08/31/2018  IMPRESSION:  1. 5 mm acute subcortical hemorrhage in the left frontal lobe. Extensive chronic white matter disease, question amyloid angiopathy given this location.  2. Attention to a minimally high-dense left parietal sulcus on follow-up.   Ct Angio Chest Pe W And/or Wo Contrast 08/31/2018 IMPRESSION:  1. Single subsegmental pulmonary embolism to the left lower lobe.  2. Coronary atherosclerosis.  3. Emphysema.    [name] Head Wo Contrast 09/02/2018 IMPRESSION:   MRI head:  1. 5 mm focus of acute hemorrhage in  the left frontal lobe is associated with a small subcortical acute/early subacute infarction. Additional punctate foci of acute/early subacute infarction are present in the left anterior frontal lobe and the left temporal lobe.  2. Numerous foci of chronic hemorrhage throughout the brain in a predominant peripheral distribution as well as superficial siderosis over the left-greater-than-right cerebral convexities. The peripheral distribution favors amyloid angiopathy over chronic sequelae of hypertension.  3. Severe chronic microvascular ischemic changes and mild to moderate volume loss of the brain.   MRA head:  Patent anterior and posterior intracranial circulation. No large vessel occlusion, aneurysm, or significant stenosis is identified.   DG Chest Portable 1 View 09/05/2018 IMPRESSION: 1. Support apparatus, as above. 2. No radiographic evidence of acute cardiopulmonary disease. 3. Aortic atherosclerosis.  Vas [place] Lower Extremity Venous (dvt) 08/31/2018 Summary:  Right: There is no evidence of deep vein thrombosis in the lower extremity. No cystic structure found in the popliteal fossa.  Left: There is no evidence of deep vein thrombosis in the lower extremity. No cystic structure found in the popliteal fossa.   EEG 09/03/2018 This is a technically difficult electroencephalogram due to the presence of muscle and movement artifact.  The portion able to be evaluated was abnormal  due to general background slowing and the presence of triphasic waves.  These are seen most commonly in encephalopathic states.  An influence of medication effect is likely present as well.    CT head Wo Contrast 09/04/2018 IMPRESSION: 1. Unchanged small focus of subcortical hemorrhage within the left frontal lobe. No midline shift or mass effect. 2. Severe chronic ischemic changes of the white matter.   PHYSICAL EXAM  Temp:  [99.4 F (37.4 C)-101.5 F (38.6 C)] 100.2 F (37.9 C) (05/23 1000) Pulse Rate:   [69-123] 113 (05/23 1000) Resp:  [12-35] 29 (05/23 1000) BP: (65-151)/(51-128) 129/76 (05/23 1000) SpO2:  [86 %-100 %] 92 % (05/23 1000) FiO2 (%):  [40 %] 40 % (05/23 0831) Weight:  [73.4 kg] 73.4 kg (05/23 0343)  General - Well nourished, well developed, intubated on sedation.  Ophthalmologic - fundi not visualized due to noncooperation.  Cardiovascular - Regular rhythm but tachycardia.  Neuro - intubated on sedation, eyes closed, not following commands, but with pain stimulation, he opens eyes but no tracking not blinking to visual threat. Eyes in mid position, doll's eyes very sluggish and delayed,  pupil equal bilaterally, reactive to light. Corneal reflex present bilaterally, gag and cough present and strong. Breathing over the vent on weaning trials.  Facial symmetry not able to test due to ET tube.  Tongue midline in mouth. On pain stimulation, withdrawal in BLEs and LUE extremities, however no significant movement of RUE. DTR diminished and no babinski. Sensation, coordination and gait not tested.   ASSESSMENT/PLAN Mr. Eain Mullendore is a 73 y.o. male with history of COPD, HTN, OSA admitted for SOB. No tPA given due to [place].    Encephalopathy with agitation  Not sure about baseline mental status, was reported to have baseline dementia  Extubation -> re-intubated  Not able to be explained by the punctate infarcts and micro bleeds on CT and MRI  CCM on board  EEG - encephalopathic state, no seizure  UA showed UTI on rocephin -> cefepime  Overnight fever, Tmax 101.1, Abx changed to vanco and cefepime   Hypotension resolved, off pressor  On Xanax and seroquel  Blood culture 1/2 positive coagulase-negative likely contamination - repeat blood culture (09/04/2018) - pending  CAA with punctate infarcts and microbleeds as well as left hemisphere hemosiderosis  MRI left frontal punctate infarcts and tiny ICH with hemosiderosis, consistent with CAA  MRA no AVM or aneurysm  CT  Head 09/04/2018 - Unchanged small focus of subcortical hemorrhage within the left frontal lobe  LE venous doppler negative for DVT  2D Echo  EF 20-25%  LDL 83  HgbA1c 5.6  UDS + for benzo  SCDs for VTE prophylaxis  aspirin 81 mg daily prior to admission, now on No antithrombotic due to [place] and CAA. Given PE and punctate infarcts, may consider ASA 81 if CT repeat showed resolving ICH. CT Head 09/04/2018 - Unchanged small focus of subcortical hemorrhage within the left frontal lobe.  Ongoing aggressive stroke risk factor management  Therapy recommendations:  Therapists signed off - pt not medically ready.  Disposition:  Pending   PE at LLL  CT chest showed small LLL segmental PE  LE venous doppler no DVT  Not on AC due to small ICH and CAA  MRI consistent with CAA  Given likely Cerebral Amyloid Angiopathy (CAA), no AC recommended.  However if pt is indicated for [facility], will recommend heparin IV with no bolus and heparin level 0.3-0.5 and close neuro check. If any neuro changes, please repeat CT head stat.   Cardiomyopathy with elevated troponin  Cardiology on board  EF 20 to 25%  Troponin elevated 0.79->1.91->1.29  Concerning for Takatsubo cardiomyopathy  Cards recommend ischemic work up once stable  Discussed with Dr. Sallyanne Kuster and no indication for [facility] at this time.   On Lasix and BiDil  Hypoglycemia, improving  Glucose 71-101-65-51-71->59->88->91->119->126->117  Received D50 on 09/01/2018  Off IVF  On tube feeding  Respiratory failure   Intubated on admission  Extubated -> reintubated  On vent  CCM on board  CXR clear 09/01/18  Fever with Tmax 101.2->100.2  Rocephin -> cefepime and vanco (started Friday 09/04/2018)  Hypotension, improving Hx of hypertension . BP fluctuating  . CCM on board  BP goal < 160 for now  Close monitoring  Hyperlipidemia  Home meds:  pravastatin 10  LDL 83, goal < 70  Pravastatin 10 resumed  Continue statin  on discharge  UTI, fever with leukocytosis  UA WBC > 50  Fever Tmax 101.2->100.2  Leukocytosis -WBC 13.8->15.9->19.5  On rocephin   Other Stroke Risk Factors  Advanced age  Obstructive sleep apnea,  on CPAP at home  Other Active Problems  COPD  Mg - 2.6->2.4->2.5  AKI - Creatinine - 1.23->1.33->1.66->1.34  Lactic Acid ->4.4->4.9->3.7  Hospital day # 5  This patient is critically ill due to CAA and ICH, respiratory failure, fever, leukocytosis, cardiomyopathy, PE, hypoglycemia, hypotension and at significant risk of neurological worsening, death form shock, hematoma expansion, brain edema, heart failure, seizure. This patient's care requires constant monitoring of vital signs, hemodynamics, respiratory and cardiac monitoring, review of multiple databases, neurological assessment, discussion with family, other specialists and medical decision making of high complexity. I spent 30 minutes of neurocritical care time in the care of this patient. Neurology will sign off on patient, discussed this with attending.    To contact Stroke Continuity provider, please refer to [email or web address] After hours, contact General Neurology

## 2018-09-05 NOTE — Progress Notes (Signed)
CRITICAL VALUE ALERT  Critical Value:  Lactic 3.7  Date & Time Notied:  09/05/18 418  Provider Notified: Warren Lacy  Orders Received/Actions taken: see notes/orders

## 2018-09-05 NOTE — Progress Notes (Signed)
Assisted tele visit to patient with wife. After waiting 5 mins the wife never joined the video chat. I attempt to reach her at the number provided and there was no answer there as well. Video chat ended after waiting 7 mins. Lenox Ahr, RN

## 2018-09-05 NOTE — Progress Notes (Signed)
[facility] Progress Note Patient Name: Jeremiah [name] DOB: [birth year] MRN: [id number]   Date of Service  09/05/2018  HPI/Events of Note  Transient hypotension with improvement after titrating down sedation  eICU Interventions  Lactate trending down which is reassuring     Intervention Category Intermediate Interventions: Hypotension - evaluation and management  Judd Lien 09/05/2018, 4:41 AM

## 2018-09-05 NOTE — Progress Notes (Signed)
° °Progress Note ° °Patient Name: Jeremiah [name] °Date of Encounter: 09/05/2018 ° °Primary Cardiologist:   Dr. Croitoru ° °Subjective  ° °Opens eyes but not not responding to commands.  Sedation is currently off.  ° °Inpatient Medications  °  °Scheduled Meds: °•  stroke: mapping our early stages of recovery book   Does not apply Once  °• ALPRAZolam  0.5 mg Per Tube BID  °• chlorhexidine gluconate (MEDLINE KIT)  15 mL Mouth Rinse BID  °• Chlorhexidine Gluconate Cloth  6 each Topical Daily  °• ipratropium-albuterol  3 mL Nebulization TID  °• isosorbide-hydrALAZINE  2 tablet Oral TID  °• mouth rinse  15 mL Mouth Rinse 10 times per day  °• pantoprazole (PROTONIX) IV  40 mg Intravenous QHS  °• pravastatin  10 mg Per Tube q1800  °• QUEtiapine  25 mg Per Tube QHS  °• senna-docusate  1 tablet Oral BID  °• traZODone  50 mg Oral QHS  ° °Continuous Infusions: °• sodium chloride 10 mL/hr at 09/04/18 1800  °• ceFEPime (MAXIPIME) IV Stopped (09/04/18 2306)  °• feeding supplement (VITAL AF 1.2 CAL) 1,000 mL (09/05/18 0339)  °• propofol (DIPRIVAN) infusion Stopped (09/05/18 0748)  °• vancomycin    ° °PRN Meds: °acetaminophen **OR** acetaminophen (TYLENOL) oral liquid 160 mg/5 mL **OR** acetaminophen, albuterol, fentaNYL (SUBLIMAZE) injection, haloperidol lactate  ° °Vital Signs  °  °Vitals:  ° 09/05/18 0830 09/05/18 0831 09/05/18 0900 09/05/18 0930  °BP: (!) 151/90  (!) 148/91 (!) 144/100  °Pulse: 100  (!) 104 (!) 102  °Resp: (!) 23  (!) 26 18  °Temp: (!) 100.6 °F (38.1 °C)  (!) 100.4 °F (38 °C) 100.2 °F (37.9 °C)  °TempSrc: Bladder     °SpO2: 99% 98% 97% 98%  °Weight:      °Height:      ° ° °Intake/Output Summary (Last 24 hours) at 09/05/2018 0950 °Last data filed at 09/05/2018 0900 °Gross per 24 hour  °Intake 1971.83 ml  °Output 1370 ml  °Net 601.83 ml  ° °Filed Weights  ° 09/03/18 0451 09/04/18 0500 09/05/18 0343  °Weight: 70.7 kg 69.8 kg 73.4 kg  ° ° °Telemetry  °  °Sinus tachy - Personally Reviewed ° °ECG  °  °NA - Personally  Reviewed ° °Physical Exam  ° °GEN: Acutely ill with intubation.  °Neck:  No JVD °Cardiac: RRR, no  murmurs, rubs, or gallops.  °Respiratory:  Clear to auscultation bilaterally. °GI: Soft, nontender, non-distended  °MS: Mild edema; No deformity. °Neuro:   Nonfocal  °Psych: Normal affect  ° °Labs  °  °Chemistry °Recent Labs  °Lab 08/31/18 °0458  09/03/18 °1140 09/04/18 °0552 09/05/18 °0329  °NA 138   < > 143 145 144  °K 3.8   < > 3.3* 3.6 3.8  °CL 103   < > 105 105 107  °CO2 22   < > 26 29 24  °GLUCOSE 200*   < > 112* 123* 127*  °BUN 17   < > 27* 36* 42*  °CREATININE 1.22   < > 1.33* 1.66* 1.34*  °CALCIUM 10.0   < > 9.2 9.3 9.1  °PROT 8.3*  --   --   --   --   °ALBUMIN 4.6  --   --   --   --   °AST 34  --   --   --   --   °ALT 14  --   --   --   --   °ALKPHOS   ALKPHOS 85  --   --   --   --   BILITOT 1.0  --   --   --   --   GFRNONAA 60*   < > 54* 41* 53*  GFRAA >60   < > >60 48* >60  ANIONGAP 13   < > _0 < > = values in this interval not displayed.     Hematology Recent Labs  Lab 09/02/18 0431 09/04/18 0552 09/05/18 0329  WBC 13.8* 15.9* 19.5*  RBC 5.24 5.14 5.13  HGB 16.0 15.7 15.8  HCT 49.9 47.6 47.9  MCV 95.2 92.6 93.4  MCH 30.5 30.5 30.8  MCHC 32.1 33.0 33.0  RDW 12.7 12.8 12.9  PLT 156 141* 123*    Cardiac Enzymes Recent Labs  Lab 08/31/18 0458 08/31/18 0952 08/31/18 1536 09/01/18 1439  TROPONINI 0.04* 0.79* 1.91* 1.29*   No results for input(s): TROPIPOC in the last 168 hours.   BNP Recent Labs  Lab 08/31/18 0459  BNP 285.0*     DDimer No results for input(s): DDIMER in the last 168 hours.   Radiology    Ct Head Wo Contrast  Result Date: 09/04/2018 CLINICAL DATA:  Intracranial hemorrhage follow up EXAM: CT HEAD WITHOUT CONTRAST TECHNIQUE: Contiguous axial images were obtained from the base of the skull through the vertex without intravenous contrast. COMPARISON:  Head CT 09/02/2018 FINDINGS: Brain: Unchanged small focus of parenchymal hemorrhage within the  subcortical left frontal lobe, measuring 6 mm. No associated mass effect. No new site of hemorrhage. Confluent hypoattenuation of the periventricular white matter consistent with chronic small vessel ischemia. Vascular: No abnormal hyperdensity of the major intracranial arteries or dural venous sinuses. No intracranial atherosclerosis. Skull: The visualized skull base, calvarium and extracranial soft tissues are normal. Sinuses/Orbits: No fluid levels or advanced mucosal thickening of the visualized paranasal sinuses. No mastoid or middle ear effusion. The orbits are normal. IMPRESSION: 1. Unchanged small focus of subcortical hemorrhage within the left frontal lobe. No midline shift or mass effect. 2. Severe chronic ischemic changes of the white matter. Electronically Signed   By: Ulyses Jarred M.D.   On: 09/04/2018 14:44   Dg Chest Port 1 View  Result Date: 09/05/2018 CLINICAL DATA:  73 year old male with history of respiratory failure. History of COPD. EXAM: PORTABLE CHEST 1 VIEW COMPARISON:  Chest x-ray 09/01/2018. FINDINGS: An endotracheal tube is in place with tip 4.8 cm above the carina. A feeding tube is seen extending into the abdomen, however, the tip of the feeding tube extends below the lower margin of the image. Lung volumes are slightly low. No consolidative airspace disease. No pleural effusions. No evidence of pulmonary edema. Heart size is upper limits of normal. The patient is rotated to the right on today's exam, resulting in distortion of the mediastinal contours and reduced diagnostic sensitivity and specificity for mediastinal pathology. Atherosclerosis in the thoracic aorta. IMPRESSION: 1. Support apparatus, as above. 2. No radiographic evidence of acute cardiopulmonary disease. 3. Aortic atherosclerosis. Electronically Signed   By: Vinnie Langton M.D.   On: 09/05/2018 05:21    Cardiac Studies   Echo   1. The left ventricle has severely reduced systolic function, with an ejection  fraction of 20-25%. The cavity size was normal. There is mildly increased left ventricular wall thickness. Findings are consistent with Takatsubo cardiomyopathy or  multivessel CAD. Left ventricular diastolic Doppler parameters are consistent with pseudonormalization. Elevated left atrial and left ventricular end-diastolic pressures The E/e'  15. Left ventricular diffuse hypokinesis. ° 2. The right ventricle has moderately reduced systolic function. The cavity was normal. There is mildly increased right ventricular wall thickness. ° 3. The mitral valve is abnormal. Mild thickening of the mitral valve leaflet. There is mild mitral annular calcification present. ° 4. The tricuspid valve is grossly normal. ° 5. The aortic valve appears bicuspid. Mild sclerosis of the aortic valve. Aortic valve regurgitation is trivial by color flow Doppler. No stenosis of the aortic valve. ° 6. The inferior vena cava was dilated in size with <50% respiratory variability. ° °Patient Profile  °   °72 y.o. male with a history of hypertension, COPD, and obstructive sleep apnea with AECOPD requiring mechanical ventilation, who is being seen  for evaluation of new LV systolic dysfunction, abnormal EKG, and elevated troponin; incidentally found to have a small subsegmental PE and intracranial bleed. Extubated and reintubated 09/01/2018. ° °Assessment & Plan  °  °ACUTE SYSTOLIC HF:  Plan repeat echo next week.  Creat is improved.     Positive 1.9 liters since admission.   BP fluctuates with sedation.  I would not suggest increasing his BiDil at this time.  ° °ACUTE ON CHRONIC RESPIRATORY FAILURE:   Vent per CCM.  Weaning sedation.   ° °PE:  Small.  No anticoagulant secondary to ICH.   ° °[facility]  ° °Signed, ° , MD  °09/05/2018, 9:50 AM   °

## 2018-09-05 NOTE — Progress Notes (Signed)
NAME:  Jeremiah [name], MRN:  [id number], DOB:  [birth year], LOS: 5 ADMISSION DATE:  08/31/2018, CONSULTATION DATE:  08/31/18 REFERRING MD:  [facility] - [facility]  CHIEF COMPLAINT:  SOB   Brief History   Jeremiah [name] is a 73 y.o. male who presented to [facility] with SOB felt to be due to AECOPD.  Required intubation after failing NIMV.  Incidentally found to have small LLL PE and small 77mm left frontal lobe hemorrhage; therefore, transferred to [facility].  History of present illness   Pt is encephelopathic; therefore, this HPI is obtained from chart review. Jeremiah [name] is a 73 y.o. male who has a PMH as outlined below including Gold III COPD for which he is followed by Dr. Chase Caller (see "past medical history").  He presented to [facility] with SOB.  He was placed on NIMV but failed and required intubation.  In ED, had significant bronchospasm, felt to be due to AECOPD. Per wife, SOB had began 1 week prior and progressively worsened.    CTA chest with incidental finding small LLL PE.  CT head with incidental finding small 22mm left frontal lobe hemorrhage.  Heparin deferred in lieu of this.  COVID negative.  In addition, EKG demonstrated J point elevation.  Discussed with cardiology who recommended trending troponins.  No emergent procedure indicated.  Pt transferred to [facility] for further evaluation and management.  Past Medical History  COPD, OSA, HTN, PTSD, Depression.  Significant Hospital Events   5/18 > admit.  Consults:  None.  Procedures:  ETT 5/18>>>  Significant Diagnostic Tests:  CXR 5/18 > COPD. CT head 5/18 > 47mm subcortical hemorrhage in left frontal lobe.  Chronic white matter disease, question amyloid angiopathy. CTA chest 5/18 > single subsegmental PE in LLL.  Emphysema.  LE duplex 5/18 > negative for DVT. Echo 5/19 > EF 20-25%, stress cardiomopathy. MRI brain 5/20 > Acute 79mm hemorrhage, scattered small hemorrhages consistent with hypertension or amyloid angiopathy.  Micro Data:  Blood 5/18  > neg Sputum 5/18 > neg RVP 5/18 > neg SARS CoV2 5/18 > neg  Antimicrobials:  Azithromycin 5/18 x 1 Ceftriaxone 5/18 > 5/22 Cefepime 5/22>>> Vancomycin 5/22>>>  Interim history/subjective:  No events overnight, no new complaints  Objective:  Blood pressure (!) 144/100, pulse (!) 102, temperature 100.2 F (37.9 C), resp. rate 18, height 6\' 3"  (1.905 m), weight 73.4 kg, SpO2 98 %.    Vent Mode: PRVC FiO2 (%):  [40 %] 40 % Set Rate:  [12 bmp] 12 bmp Vt Set:  [670 mL] 670 mL PEEP:  [5 cmH20] 5 cmH20 Plateau Pressure:  [13 cmH20-26 cmH20] 18 cmH20   Intake/Output Summary (Last 24 hours) at 09/05/2018 0347 Last data filed at 09/05/2018 0900 Gross per 24 hour  Intake 1971.83 ml  Output 1370 ml  Net 601.83 ml   Filed Weights   09/03/18 0451 09/04/18 0500 09/05/18 0343  Weight: 70.7 kg 69.8 kg 73.4 kg   Examination:  General: Alert and interactive, moving all ext to command Neuro: Off sedation, awake and moving ext to command HEENT: [place]/AT, PERRL, EOM-I and MMM, ETT in place Cardiovascular: S1, S2, RRR, no M/R/G Lungs: CTA bilaterally Abdomen: Soft, NT, ND and +BS Musculoskeletal: -edema and -tenderness Skin: Intact  Assessment & Plan:   Acute respiratory failure requiring mechanical ventilation. Decompensation due to possible stridor and agitation leading to increasing work of breathing - Ready for extubation from lung mechanics standpoint  - Mental status needs to improve. Plan - Keep sedation off  at this point - Attempt extubation today - Titrate FiO2 for sat of 88-92% - CXR and ABG while intubated - Adjust vent for ABG  AECOPD - required intubation after failing NIMV.  - BD as ordered - Cefepime and vanc, continue for now, MRSA in blood - Saturation goal is 88-92%  Fever and increase WBC 5/22 Plan Blood Cx x 2, repeat 5/22 Urine for strep and legionella, negative Sputum Cx Follow micro  Stress cardiomyopathy with EF 25 %. Optimized heart failure will  help extubation. -Introduce GDHFT Bidil tid - HTN Plan - Lasix 40 mg IV q6 x3 doses - If BP allows, add beta-blocker - Will d/c ARB as creatinine has increased - Continue Bidil  Increasing Creatinine 5/22 1.22 on admission>> 1.66 now at 1.32 Plan BMET daily Replete electrolytes as needed Monitor Urine output Keep off ARB  Small LLL PE. - Echo and Duplex doppler results noted - Defer heparin given small left frontal lobe hemorrhage - Restart ASA 81 mg daily PO  Small left frontal lobe hemorrhage. No significant improvement in MS Hypertension related. Plan - Repeat imaging 5/20 shows stable small hemorrhage, stable, no change - Consider further imaging / EEG if MS does not improve, will defer to neuro  Hx HTN, HLD. - Continue BIDIL. - Continue preadmission pravastatin. - Platelets 141,000>> monitor and resume ASA once WNL  Wife updated over the phone  Best Practice:  Diet: NPO. tube feeds. Moderate nutritional risk. Pain/Anxiety/Delirium protocol (if indicated): Propofol gtt / Fentanyl PRN.  RASS goal 0 to -1. VAP protocol (if indicated): In place DVT prophylaxis: SCD's. GI prophylaxis: PPI. Glucose control: SSI. Mobility: Bedrest. Code Status: Full. Family Communication: Brother in Sports coach updated  By nursing 5/22  He is a physician and is relaying information to pt's spouse. Disposition: ICU.  The patient is critically ill with multiple organ systems failure and requires high complexity decision making for assessment and support, frequent evaluation and titration of therapies, application of advanced monitoring technologies and extensive interpretation of multiple databases.   Critical Care Time devoted to patient care services described in this note is  32  Minutes. This time reflects time of care of this signee Dr Jennet Maduro. This critical care time does not reflect procedure time, or teaching time or supervisory time of PA/NP/Med student/Med Resident etc but could  involve care discussion time.  Rush Farmer, M.D. [facility] Pulmonary/Critical Care Medicine. Pager: [phone]. After hours pager: [phone].

## 2018-09-06 ENCOUNTER — Inpatient Hospital Stay (HOSPITAL_COMMUNITY): Payer: Medicare Other

## 2018-09-06 LAB — BASIC METABOLIC PANEL
Anion gap: 16 — ABNORMAL HIGH (ref 5–15)
BUN: 54 mg/dL — ABNORMAL HIGH (ref 8–23)
CO2: 25 mmol/L (ref 22–32)
Calcium: 9.6 mg/dL (ref 8.9–10.3)
Chloride: 104 mmol/L (ref 98–111)
Creatinine, Ser: 2.02 mg/dL — ABNORMAL HIGH (ref 0.61–1.24)
GFR calc Af Amer: 38 mL/min — ABNORMAL LOW (ref >60)
GFR calc non Af Amer: 32 mL/min — ABNORMAL LOW (ref >60)
Glucose, Bld: 135 mg/dL — ABNORMAL HIGH (ref 70–99)
Potassium: 4.1 mmol/L (ref 3.5–5.1)
Sodium: 145 mmol/L (ref 135–145)

## 2018-09-06 LAB — GLUCOSE, CAPILLARY
Glucose-Capillary: 126 mg/dL — ABNORMAL HIGH (ref 70–99)
Glucose-Capillary: 131 mg/dL — ABNORMAL HIGH (ref 70–99)
Glucose-Capillary: 140 mg/dL — ABNORMAL HIGH (ref 70–99)
Glucose-Capillary: 147 mg/dL — ABNORMAL HIGH (ref 70–99)
Glucose-Capillary: 151 mg/dL — ABNORMAL HIGH (ref 70–99)
Glucose-Capillary: 154 mg/dL — ABNORMAL HIGH (ref 70–99)

## 2018-09-06 LAB — POCT I-STAT 7, (LYTES, BLD GAS, ICA,H+H)
Acid-Base Excess: 3 mmol/L — ABNORMAL HIGH (ref 0.0–2.0)
Bicarbonate: 26.5 mmol/L (ref 20.0–28.0)
Calcium, Ion: 1.25 mmol/L (ref 1.15–1.40)
HCT: 43 % (ref 39.0–52.0)
Hemoglobin: 14.6 g/dL (ref 13.0–17.0)
O2 Saturation: 97 %
Patient temperature: 98.6
Potassium: 3.9 mmol/L (ref 3.5–5.1)
Sodium: 143 mmol/L (ref 135–145)
TCO2: 28 mmol/L (ref 22–32)
pCO2 arterial: 37.7 mmHg (ref 32.0–48.0)
pH, Arterial: 7.455 — ABNORMAL HIGH (ref 7.350–7.450)
pO2, Arterial: 90 mmHg (ref 83.0–108.0)

## 2018-09-06 LAB — CBC
HCT: 46.9 % (ref 39.0–52.0)
Hemoglobin: 15.4 g/dL (ref 13.0–17.0)
MCH: 30.5 pg (ref 26.0–34.0)
MCHC: 32.8 g/dL (ref 30.0–36.0)
MCV: 92.9 fL (ref 80.0–100.0)
Platelets: 137 10*3/uL — ABNORMAL LOW (ref 150–400)
RBC: 5.05 MIL/uL (ref 4.22–5.81)
RDW: 13 % (ref 11.5–15.5)
WBC: 20 10*3/uL — ABNORMAL HIGH (ref 4.0–10.5)
nRBC: 0 % (ref 0.0–0.2)

## 2018-09-06 LAB — CULTURE, RESPIRATORY W GRAM STAIN: Culture: NORMAL

## 2018-09-06 LAB — PHOSPHORUS: Phosphorus: 4.4 mg/dL (ref 2.5–4.6)

## 2018-09-06 LAB — TRIGLYCERIDES: Triglycerides: 76 mg/dL (ref <150)

## 2018-09-06 LAB — PROCALCITONIN: Procalcitonin: 1.24 ng/mL

## 2018-09-06 LAB — MAGNESIUM: Magnesium: 3 mg/dL — ABNORMAL HIGH (ref 1.7–2.4)

## 2018-09-06 MED ORDER — VANCOMYCIN HCL 10 G IV SOLR: 1250.0000 mg | INTRAVENOUS | Status: DC

## 2018-09-06 MED ORDER — FUROSEMIDE 10 MG/ML IJ SOLN
40.0000 mg | Freq: Four times a day (QID) | INTRAMUSCULAR | Status: AC
  Administered 2018-09-06 (×3): 40 mg via INTRAVENOUS
  Filled 2018-09-06 (×3): qty 4

## 2018-09-06 MED ORDER — DEXMEDETOMIDINE HCL IN NACL 400 MCG/100ML IV SOLN
0.4000 ug/kg/h | INTRAVENOUS | Status: DC
  Administered 2018-09-06: 1 ug/kg/h via INTRAVENOUS
  Administered 2018-09-06: 0.4 ug/kg/h via INTRAVENOUS
  Administered 2018-09-07: 09:00:00 0.5 ug/kg/h via INTRAVENOUS
  Administered 2018-09-07 – 2018-09-08 (×2): 0.6 ug/kg/h via INTRAVENOUS
  Administered 2018-09-08: 1 ug/kg/h via INTRAVENOUS
  Administered 2018-09-08: 0.8 ug/kg/h via INTRAVENOUS
  Administered 2018-09-08: 0.9 ug/kg/h via INTRAVENOUS
  Administered 2018-09-09: 1.2 ug/kg/h via INTRAVENOUS
  Administered 2018-09-09: 1 ug/kg/h via INTRAVENOUS
  Filled 2018-09-06 (×10): qty 100

## 2018-09-06 MED ORDER — QUETIAPINE FUMARATE 50 MG PO TABS
50.0000 mg | ORAL_TABLET | Freq: Every day | ORAL | Status: DC
  Administered 2018-09-06: 50 mg
  Filled 2018-09-06: qty 1

## 2018-09-06 NOTE — Progress Notes (Signed)
NAME:  Jeremiah [name], MRN:  [id number], DOB:  18-Nov-1945, LOS: 6 ADMISSION DATE:  08/31/2018, CONSULTATION DATE:  08/31/18 REFERRING MD:  R[facility] - [facility]  CHIEF COMPLAINT:  SOB   Brief History   Jeremiah [name] is a 73 y.o. male who presented to [facility] with SOB felt to be due to AECOPD.  Required intubation after failing NIMV.  Incidentally found to have small LLL PE and small 82mm left frontal lobe hemorrhage; therefore, transferred to [facility].  History of present illness   Pt is encephelopathic; therefore, this HPI is obtained from chart review. Jeremiah [name] is a 73 y.o. male who has a PMH as outlined below including Gold III COPD for which he is followed by Dr. Chase Caller (see "past medical history").  He presented to [facility] with SOB.  He was placed on NIMV but failed and required intubation.  In ED, had significant bronchospasm, felt to be due to AECOPD. Per wife, SOB had began 1 week prior and progressively worsened.    CTA chest with incidental finding small LLL PE.  CT head with incidental finding small 86mm left frontal lobe hemorrhage.  Heparin deferred in lieu of this.  COVID negative.  In addition, EKG demonstrated J point elevation.  Discussed with cardiology who recommended trending troponins.  No emergent procedure indicated.  Pt transferred to [facility] for further evaluation and management.  Past Medical History  COPD, OSA, HTN, PTSD, Depression.  Significant Hospital Events   5/18 > admit.  Consults:  None.  Procedures:  ETT 5/18>>>  Significant Diagnostic Tests:  CXR 5/18 > COPD. CT head 5/18 > 76mm subcortical hemorrhage in left frontal lobe.  Chronic white matter disease, question amyloid angiopathy. CTA chest 5/18 > single subsegmental PE in LLL.  Emphysema.  LE duplex 5/18 > negative for DVT. Echo 5/19 > EF 20-25%, stress cardiomopathy. MRI brain 5/20 > Acute 40mm hemorrhage, scattered small hemorrhages consistent with hypertension or amyloid angiopathy.  Micro Data:  Blood 5/18  > Coag negative staph Blood ID panel with MRSA Sputum 5/18 > neg RVP 5/18 > neg SARS CoV2 5/18 > neg Blood 5/22>>>pending  Antimicrobials:  Azithromycin 5/18 x 1 Ceftriaxone 5/18 > 5/22 Cefepime 5/22>>> Vancomycin 5/22>>>  Interim history/subjective:  Failed weaning efforts this AM Renal function continues to worsens  Objective:  Blood pressure (!) 77/60, pulse (!) 103, temperature 99.9 F (37.7 C), resp. rate (!) 26, height 6\' 3"  (1.905 m), weight 73.4 kg, SpO2 94 %.    Vent Mode: PSV;CPAP FiO2 (%):  [40 %] 40 % Set Rate:  [12 bmp] 12 bmp Vt Set:  [670 mL] 670 mL PEEP:  [5 cmH20] 5 cmH20 Pressure Support:  [12 cmH20] 12 cmH20 Plateau Pressure:  [15 cmH20-23 cmH20] 16 cmH20   Intake/Output Summary (Last 24 hours) at 09/06/2018 0909 Last data filed at 09/06/2018 0900 Gross per 24 hour  Intake 2068.28 ml  Output 2460 ml  Net -391.72 ml   Filed Weights   09/03/18 0451 09/04/18 0500 09/05/18 0343  Weight: 70.7 kg 69.8 kg 73.4 kg   Examination:  General: Sedate, not following commands, acutely ill appearing male Neuro: On propofol, not following commands HEENT: [place]/AT, PERRL, EOM-I and MMM, ETT in place Cardiovascular: RRR, Nl S1/S2 and -M/R/G Lungs: CTA bilaterally Abdomen: Soft, NT, ND and +BS Musculoskeletal: -edema and -tenderness Skin: Intact  Assessment & Plan:   Acute respiratory failure requiring mechanical ventilation. Decompensation due to possible stridor and agitation leading to increasing work of breathing - Ready  for extubation from lung mechanics standpoint  - Mental status needs to improve. Plan - D/C propofol - Add precedex - Hold weaning efforts for today - Titrate FiO2 for sat of 88-92% - CXR and ABG while intubated - Adjust vent for ABG - ?if trach is an option, will recommend against this  AECOPD - required intubation after failing NIMV. Plan: - BD as ordered - Cefepime and vanc, continue for now, MRSA in blood - Saturation goal is  88-92%  Fever and increase WBC 5/22 Blood Cx x 2, repeat 5/22 Urine for strep and legionella, negative Sputum Cx Follow micro  Stress cardiomyopathy with EF 25 %. Optimized heart failure will help extubation. -Introduce GDHFT Bidil tid - HTN Plan: - Lasix 40 mg IV q6 x3 doses - If BP allows, add beta-blocker - D/C ARB - Continue Bidil  Increasing Creatinine 5/22 1.22 on admission>> 1.66 now at 1.32 Plan: - BMET daily - Replete electrolytes as needed - Monitor Urine output - Keep off ARB - Family discussing if dialysis is an option  Small LLL PE. - Echo and Duplex doppler results noted - Defer heparin given small left frontal lobe hemorrhage - Restart ASA 81 mg daily PO  Small left frontal lobe hemorrhage. No significant improvement in MS Hypertension related. Plan - Repeat imaging 5/20 shows stable small hemorrhage, stable, no change - Neurology has no further recommendations  Hx HTN, HLD. - Continue BIDIL. - Continue preadmission pravastatin.  Spoke with the patient's brother in law at length, I am concerned that the patient will not do well.  Renal and respiratory function are getting worse.  The wife deferred questions to her brother who is a physician in [place].  I asked him to post the question of dialysis, trach/peg and code status with recommendations against trach/peg and if dialysis is as a short term only to enable [place] to extubate with no intention to reintubate and DNR status.  He will speak with her and call us back with decisions.  Best Practice:  Diet: NPO. tube feeds. Moderate nutritional risk. Pain/Anxiety/Delirium protocol (if indicated): Propofol gtt / Fentanyl PRN.  RASS goal 0 to -1. VAP protocol (if indicated): In place DVT prophylaxis: SCD's. GI prophylaxis: PPI. Glucose control: SSI. Mobility: Bedrest. Code Status: Full. Family Communication: Brother in Sports coach updated  By nursing 5/22  He is a physician and is relaying information to pt's spouse.  Disposition: ICU.  The patient is critically ill with multiple organ systems failure and requires high complexity decision making for assessment and support, frequent evaluation and titration of therapies, application of advanced monitoring technologies and extensive interpretation of multiple databases.   Critical Care Time devoted to patient care services described in this note is  45  Minutes. This time reflects time of care of this signee Dr Jennet Maduro. This critical care time does not reflect procedure time, or teaching time or supervisory time of PA/NP/Med student/Med Resident etc but could involve care discussion time.  Rush Farmer, M.D. [facility] Pulmonary/Critical Care Medicine. Pager: [phone]. After hours pager: [phone].

## 2018-09-06 NOTE — Progress Notes (Signed)
RT note:  Patient placed on CPAP/PSV of 12/5 at 0737.  Currently tolerating well.  Will continue to monitor.

## 2018-09-06 NOTE — Progress Notes (Signed)
At beside, patient's BPs remain soft. Now 74 systolic, has functioning PIVs. Will evlauate 5/25 to see if PICC still needed and BPs. Will continue to monitor.

## 2018-09-06 NOTE — Progress Notes (Signed)
Pharmacy Antibiotic Note  Jeremiah [name] is a 73 y.o. male admitted to [facility] on 08/31/2018 with SOB and was pale and clammy.  Patient transferred to [facility] for further management.  She was on Rocephin for COPD exacerbation.  Now with fever and leukocytosis, so  Pharmacy has been consulted for vancomycin and cefepime dosing for PNA.  SCr trending up. Blood with 1/2 MRSE.    Plan: Adjust Vancomycin to 1250mg  IV every 36 hours - est AUC 436.  Levels at Css.  Cefepime 2gm IV Q12H Monitor renal fxn, micro data, vanc trough as indicated   Height: 6\' 3"  (190.5 cm) Weight: 161 lb 13.1 oz (73.4 kg) IBW/kg (Calculated) : 84.5  Temp (24hrs), Avg:100 F (37.8 C), Min:90.9 F (32.7 C), Max:100.9 F (38.3 C)  Recent Labs  Lab 08/31/18 0459 08/31/18 0646 08/31/18 0952 08/31/18 1204 09/01/18 0253 09/02/18 0431  09/03/18 0930 09/03/18 1140 09/04/18 0552 09/05/18 0329 09/06/18 0521  WBC  --   --   --   --  12.3* 13.8*  --   --   --  15.9* 19.5* 20.0*  CREATININE  --   --   --   --  1.39* 1.01   < > 1.23 1.33* 1.66* 1.34* 2.02*  LATICACIDVEN 1.6 4.8* 4.4* 4.9*  --   --   --   --   --   --  3.7*  --    < > = values in this interval not displayed.    Estimated Creatinine Clearance: 35.3 mL/min (A) (by C-G formula based on SCr of 2.02 mg/dL (H)).    No Known Allergies  CTX 5/18 >> 5/22 Azithro 5/18 x 1 Vanc 5/22 >> Cefepime 5/22 >>  5/18 covid - negative 5/18 MRSA PCR - neg 5/18 resp panel PCR - neg 5/18 BCx - 1/2 MRSE (BCID 1/4 CoNS, likely contaminant) 5/22 BCx -ngtd 5/22 TA - ngtd  Sloan Leiter, PharmD, BCPS, BCCCP Clinical Pharmacist Please refer to [facility] for [place] numbers 09/06/2018, 12:17 PM

## 2018-09-06 NOTE — Progress Notes (Signed)
Progress Note  Patient Name: Jeremiah [name] Check Date of Encounter: 09/06/2018  Primary Cardiologist:   Dr. Sallyanne Kuster  Subjective   Intubated and sedated  Inpatient Medications    Scheduled Meds:   stroke: mapping our early stages of recovery book   Does not apply Once   ALPRAZolam  0.5 mg Per Tube BID   aspirin  81 mg Per Tube Daily   chlorhexidine gluconate (MEDLINE KIT)  15 mL Mouth Rinse BID   Chlorhexidine Gluconate Cloth  6 each Topical Daily   furosemide  40 mg Intravenous Q6H   ipratropium-albuterol  3 mL Nebulization TID   isosorbide-hydrALAZINE  2 tablet Per Tube TID   mouth rinse  15 mL Mouth Rinse 10 times per day   pantoprazole (PROTONIX) IV  40 mg Intravenous QHS   pravastatin  10 mg Per Tube q1800   QUEtiapine  50 mg Per Tube QHS   senna-docusate  1 tablet Oral BID   traZODone  50 mg Oral QHS   Continuous Infusions:  sodium chloride 10 mL/hr at 09/06/18 1000   ceFEPime (MAXIPIME) IV Stopped (09/05/18 2303)   dexmedetomidine (PRECEDEX) IV infusion 1 mcg/kg/hr (09/06/18 1000)   feeding supplement (VITAL AF 1.2 CAL) 65 mL/hr at 09/06/18 1000   vancomycin Stopped (09/05/18 1140)   PRN Meds: acetaminophen **OR** acetaminophen (TYLENOL) oral liquid 160 mg/5 mL **OR** acetaminophen, albuterol, fentaNYL (SUBLIMAZE) injection, haloperidol lactate   Vital Signs    Vitals:   09/06/18 0830 09/06/18 0900 09/06/18 0915 09/06/18 1000  BP: 95/69 (!) 77/60 (!) 89/62 (!) 145/96  Pulse: (!) 105 (!) 103 98 (!) 101  Resp: (!) 25 (!) 26 14 (!) 23  Temp: 100 F (37.8 C) 99.9 F (37.7 C) 99.5 F (37.5 C) 98.2 F (36.8 C)  TempSrc:      SpO2: 95% 94% 96% 95%  Weight:      Height:        Intake/Output Summary (Last 24 hours) at 09/06/2018 1024 Last data filed at 09/06/2018 1000 Gross per 24 hour  Intake 2092.07 ml  Output 2405 ml  Net -312.93 ml   Filed Weights   09/03/18 0451 09/04/18 0500 09/05/18 0343  Weight: 70.7 kg 69.8 kg 73.4 kg     Telemetry    NSR - Personally Reviewed  ECG    NA - Personally Reviewed  Physical Exam   GEN: No  acute distress.   Neck: No  JVD Cardiac: RRR, no murmurs, rubs, or gallops.  Respiratory: Clear   to auscultation bilaterally. GI:   Decreased bowel sounds MS:  No edema; No deformity. Neuro:   Intubated and sedated.    Labs    Chemistry Recent Labs  Lab 08/31/18 0458  09/04/18 0552 09/05/18 0329 09/06/18 0416 09/06/18 0521  NA 138   < > 145 144 143 145  K 3.8   < > 3.6 3.8 3.9 4.1  CL 103   < > 105 107  --  104  CO2 22   < > 29 24  --  25  GLUCOSE 200*   < > 123* 127*  --  135*  BUN 17   < > 36* 42*  --  54*  CREATININE 1.22   < > 1.66* 1.34*  --  2.02*  CALCIUM 10.0   < > 9.3 9.1  --  9.6  PROT 8.3*  --   --   --   --   --   ALBUMIN 4.6  --   --   --   --   --  AST 34  --   --   --   --   --   ALT 14  --   --   --   --   --   ALKPHOS 85  --   --   --   --   --   BILITOT 1.0  --   --   --   --   --   GFRNONAA 60*   < > 41* 53*  --  32*  GFRAA >60   < > 48* >60  --  38*  ANIONGAP 13   < > 11 13  --  16*   < > = values in this interval not displayed.     Hematology Recent Labs  Lab 09/04/18 0552 09/05/18 0329 09/06/18 0416 09/06/18 0521  WBC 15.9* 19.5*  --  20.0*  RBC 5.14 5.13  --  5.05  HGB 15.7 15.8 14.6 15.4  HCT 47.6 47.9 43.0 46.9  MCV 92.6 93.4  --  92.9  MCH 30.5 30.8  --  30.5  MCHC 33.0 33.0  --  32.8  RDW 12.8 12.9  --  13.0  PLT 141* 123*  --  137*    Cardiac Enzymes Recent Labs  Lab 08/31/18 0458 08/31/18 0952 08/31/18 1536 09/01/18 1439  TROPONINI 0.04* 0.79* 1.91* 1.29*   No results for input(s): TROPIPOC in the last 168 hours.   BNP Recent Labs  Lab 08/31/18 0459  BNP 285.0*     DDimer No results for input(s): DDIMER in the last 168 hours.   Radiology    Ct Head Wo Contrast  Result Date: 09/04/2018 CLINICAL DATA:  Intracranial hemorrhage follow up EXAM: CT HEAD WITHOUT CONTRAST TECHNIQUE: Contiguous axial  images were obtained from the base of the skull through the vertex without intravenous contrast. COMPARISON:  Head CT 09/02/2018 FINDINGS: Brain: Unchanged small focus of parenchymal hemorrhage within the subcortical left frontal lobe, measuring 6 mm. No associated mass effect. No new site of hemorrhage. Confluent hypoattenuation of the periventricular white matter consistent with chronic small vessel ischemia. Vascular: No abnormal hyperdensity of the major intracranial arteries or dural venous sinuses. No intracranial atherosclerosis. Skull: The visualized skull base, calvarium and extracranial soft tissues are normal. Sinuses/Orbits: No fluid levels or advanced mucosal thickening of the visualized paranasal sinuses. No mastoid or middle ear effusion. The orbits are normal. IMPRESSION: 1. Unchanged small focus of subcortical hemorrhage within the left frontal lobe. No midline shift or mass effect. 2. Severe chronic ischemic changes of the white matter. Electronically Signed   By: Ulyses Jarred M.D.   On: 09/04/2018 14:44   Dg Chest Port 1 View  Result Date: 09/06/2018 CLINICAL DATA:  73 year old male with history of intubation. EXAM: PORTABLE CHEST 1 VIEW COMPARISON:  Chest x-ray 09/05/2018. FINDINGS: An endotracheal tube is in place with tip 5.1 cm above the carina. A feeding tube is seen extending into the abdomen, however, the tip of the feeding tube extends below the lower margin of the image. Linear scarring or subsegmental atelectasis in the right lung base. No consolidative airspace disease. No pleural effusions. No evidence of pulmonary edema. No pneumothorax. Heart size is normal. Upper mediastinal contours are within normal limits. IMPRESSION: 1. Support apparatus, as above. 2. Minimal subsegmental atelectasis in the right lung base. Otherwise, unremarkable radiographic appearance the chest, as above. Electronically Signed   By: Vinnie Langton M.D.   On: 09/06/2018 07:16   Dg Chest [facility] 1  View  Result  Date: 09/05/2018 CLINICAL DATA:  73 year old male with history of respiratory failure. History of COPD. EXAM: PORTABLE CHEST 1 VIEW COMPARISON:  Chest x-ray 09/01/2018. FINDINGS: An endotracheal tube is in place with tip 4.8 cm above the carina. A feeding tube is seen extending into the abdomen, however, the tip of the feeding tube extends below the lower margin of the image. Lung volumes are slightly low. No consolidative airspace disease. No pleural effusions. No evidence of pulmonary edema. Heart size is upper limits of normal. The patient is rotated to the right on today's exam, resulting in distortion of the mediastinal contours and reduced diagnostic sensitivity and specificity for mediastinal pathology. Atherosclerosis in the thoracic aorta. IMPRESSION: 1. Support apparatus, as above. 2. No radiographic evidence of acute cardiopulmonary disease. 3. Aortic atherosclerosis. Electronically Signed   By: Vinnie Langton M.D.   On: 09/05/2018 05:21   [place] Ekg Site Rite  Result Date: 09/05/2018 If Site Rite image not attached, placement could not be confirmed due to current cardiac rhythm.   Cardiac Studies   Echo   1. The left ventricle has severely reduced systolic function, with an ejection fraction of 20-25%. The cavity size was normal. There is mildly increased left ventricular wall thickness. Findings are consistent with Takatsubo cardiomyopathy or  multivessel CAD. Left ventricular diastolic Doppler parameters are consistent with pseudonormalization. Elevated left atrial and left ventricular end-diastolic pressures The E/e' is >15. Left ventricular diffuse hypokinesis.  2. The right ventricle has moderately reduced systolic function. The cavity was normal. There is mildly increased right ventricular wall thickness.  3. The mitral valve is abnormal. Mild thickening of the mitral valve leaflet. There is mild mitral annular calcification present.  4. The tricuspid valve is grossly  normal.  5. The aortic valve appears bicuspid. Mild sclerosis of the aortic valve. Aortic valve regurgitation is trivial by color flow Doppler. No stenosis of the aortic valve.  6. The inferior vena cava was dilated in size with <50% respiratory variability.  Patient Profile     73 y.o. male with a history of hypertension, COPD, and obstructive sleep apnea with AECOPD requiring mechanical ventilation, who is being seen  for evaluation of new LV systolic dysfunction, abnormal EKG, and elevated troponin; incidentally found to have a small subsegmental PE and intracranial bleed. Extubated and reintubated 09/01/2018.  Assessment & Plan    ACUTE SYSTOLIC HF:  Plan repeat echo next week.  Creat has jumped up.  still positive 1 liter since admission.   BP is labile and unable to titrate meds.  Discussed with his wife via video.   \RE:   Vent per CCM.  Weaning sedation.  Ultimate plans being discussed with the family concerning code status and level of care.    PE:  Small.  No anticoagulant secondary to [place].    [facility]   Signed, Minus Breeding, MD  09/06/2018, 10:24 AM

## 2018-09-06 NOTE — Progress Notes (Signed)
RT came to patient room due to patient having increased use of accessory muscles, ventilator alarm alarming increased respiratory rate.  Placed patient back on full support ventilation and noted that patient's peak pressures had increased to high 40s.  Attempted to lavage patient however only able to obtain a moderate amount of thick, white, frothy secretions.  Attempted to bag lavage patient and was able to able more white secretions.  Gave patient PRN Albuterol treatment and RN had increased patient's sedation back.  Ventilator synchrony improved and peak pressures improved some time after.  Will continue to monitor.

## 2018-09-07 ENCOUNTER — Encounter (HOSPITAL_COMMUNITY): Payer: Self-pay | Admitting: Cardiology

## 2018-09-07 ENCOUNTER — Inpatient Hospital Stay (HOSPITAL_COMMUNITY): Payer: Medicare Other

## 2018-09-07 DIAGNOSIS — I420 Dilated cardiomyopathy: Secondary | ICD-10-CM

## 2018-09-07 DIAGNOSIS — G9341 Metabolic encephalopathy: Secondary | ICD-10-CM

## 2018-09-07 DIAGNOSIS — I5041 Acute combined systolic (congestive) and diastolic (congestive) heart failure: Secondary | ICD-10-CM

## 2018-09-07 DIAGNOSIS — N171 Acute kidney failure with acute cortical necrosis: Secondary | ICD-10-CM

## 2018-09-07 DIAGNOSIS — Z9911 Dependence on respirator [ventilator] status: Secondary | ICD-10-CM

## 2018-09-07 LAB — CBC
HCT: 42.1 % (ref 39.0–52.0)
Hemoglobin: 14 g/dL (ref 13.0–17.0)
MCH: 30.6 pg (ref 26.0–34.0)
MCHC: 33.3 g/dL (ref 30.0–36.0)
MCV: 91.9 fL (ref 80.0–100.0)
Platelets: 143 10*3/uL — ABNORMAL LOW (ref 150–400)
RBC: 4.58 MIL/uL (ref 4.22–5.81)
RDW: 13.1 % (ref 11.5–15.5)
WBC: 16.6 10*3/uL — ABNORMAL HIGH (ref 4.0–10.5)
nRBC: 0 % (ref 0.0–0.2)

## 2018-09-07 LAB — BASIC METABOLIC PANEL
Anion gap: 12 (ref 5–15)
BUN: 79 mg/dL — ABNORMAL HIGH (ref 8–23)
CO2: 29 mmol/L (ref 22–32)
Calcium: 9.3 mg/dL (ref 8.9–10.3)
Chloride: 103 mmol/L (ref 98–111)
Creatinine, Ser: 2.49 mg/dL — ABNORMAL HIGH (ref 0.61–1.24)
GFR calc Af Amer: 29 mL/min — ABNORMAL LOW (ref >60)
GFR calc non Af Amer: 25 mL/min — ABNORMAL LOW (ref >60)
Glucose, Bld: 162 mg/dL — ABNORMAL HIGH (ref 70–99)
Potassium: 3.8 mmol/L (ref 3.5–5.1)
Sodium: 144 mmol/L (ref 135–145)

## 2018-09-07 LAB — GLUCOSE, CAPILLARY
Glucose-Capillary: 119 mg/dL — ABNORMAL HIGH (ref 70–99)
Glucose-Capillary: 125 mg/dL — ABNORMAL HIGH (ref 70–99)
Glucose-Capillary: 134 mg/dL — ABNORMAL HIGH (ref 70–99)
Glucose-Capillary: 136 mg/dL — ABNORMAL HIGH (ref 70–99)
Glucose-Capillary: 142 mg/dL — ABNORMAL HIGH (ref 70–99)
Glucose-Capillary: 148 mg/dL — ABNORMAL HIGH (ref 70–99)
Glucose-Capillary: 155 mg/dL — ABNORMAL HIGH (ref 70–99)

## 2018-09-07 LAB — POCT I-STAT 7, (LYTES, BLD GAS, ICA,H+H)
Acid-Base Excess: 5 mmol/L — ABNORMAL HIGH (ref 0.0–2.0)
Bicarbonate: 29.2 mmol/L — ABNORMAL HIGH (ref 20.0–28.0)
Calcium, Ion: 1.27 mmol/L (ref 1.15–1.40)
HCT: 42 % (ref 39.0–52.0)
Hemoglobin: 14.3 g/dL (ref 13.0–17.0)
O2 Saturation: 97 %
Patient temperature: 99.1
Potassium: 3.4 mmol/L — ABNORMAL LOW (ref 3.5–5.1)
Sodium: 142 mmol/L (ref 135–145)
TCO2: 30 mmol/L (ref 22–32)
pCO2 arterial: 41.1 mmHg (ref 32.0–48.0)
pH, Arterial: 7.462 — ABNORMAL HIGH (ref 7.350–7.450)
pO2, Arterial: 87 mmHg (ref 83.0–108.0)

## 2018-09-07 LAB — MAGNESIUM: Magnesium: 2.9 mg/dL — ABNORMAL HIGH (ref 1.7–2.4)

## 2018-09-07 LAB — PHOSPHORUS: Phosphorus: 4.9 mg/dL — ABNORMAL HIGH (ref 2.5–4.6)

## 2018-09-07 MED ORDER — PHENYLEPHRINE HCL-NACL 10-0.9 MG/250ML-% IV SOLN
0.0000 ug/min | INTRAVENOUS | Status: DC
  Filled 2018-09-07: qty 250

## 2018-09-07 MED ORDER — CHLORHEXIDINE GLUCONATE 0.12 % MT SOLN
OROMUCOSAL | Status: AC
  Filled 2018-09-07: qty 15

## 2018-09-07 MED ORDER — SODIUM CHLORIDE 0.9 % IV SOLN
250.0000 mL | INTRAVENOUS | Status: DC
  Administered 2018-09-07: 250 mL via INTRAVENOUS

## 2018-09-07 MED ORDER — ARFORMOTEROL TARTRATE 15 MCG/2ML IN NEBU
15.0000 ug | INHALATION_SOLUTION | Freq: Two times a day (BID) | RESPIRATORY_TRACT | Status: DC
  Administered 2018-09-07 – 2018-09-21 (×29): 15 ug via RESPIRATORY_TRACT
  Filled 2018-09-07 (×30): qty 2

## 2018-09-07 MED ORDER — SODIUM CHLORIDE 0.9% FLUSH
10.0000 mL | Freq: Two times a day (BID) | INTRAVENOUS | Status: DC
  Administered 2018-09-08 – 2018-09-18 (×13): 10 mL

## 2018-09-07 MED ORDER — NOREPINEPHRINE 4 MG/250ML-% IV SOLN: 0.0000 ug/min | INTRAVENOUS | Status: DC

## 2018-09-07 MED ORDER — ISOSORB DINITRATE-HYDRALAZINE 20-37.5 MG PO TABS: 1.0000 | ORAL_TABLET | Freq: Three times a day (TID) | ORAL | Status: DC

## 2018-09-07 MED ORDER — BUDESONIDE 0.5 MG/2ML IN SUSP
0.5000 mg | Freq: Two times a day (BID) | RESPIRATORY_TRACT | Status: DC
  Administered 2018-09-07 – 2018-09-21 (×29): 0.5 mg via RESPIRATORY_TRACT
  Filled 2018-09-07 (×30): qty 2

## 2018-09-07 MED ORDER — CHLORHEXIDINE GLUCONATE CLOTH 2 % EX PADS: 6.0000 | MEDICATED_PAD | Freq: Every day | CUTANEOUS | Status: DC

## 2018-09-07 MED ORDER — FENTANYL CITRATE (PF) 100 MCG/2ML IJ SOLN
25.0000 ug | INTRAMUSCULAR | Status: DC | PRN
  Administered 2018-09-07 – 2018-09-09 (×15): 25 ug via INTRAVENOUS
  Administered 2018-09-09: 50 ug via INTRAVENOUS
  Administered 2018-09-09 (×4): 25 ug via INTRAVENOUS
  Filled 2018-09-07 (×19): qty 2

## 2018-09-07 MED ORDER — FAMOTIDINE 20 MG PO TABS
20.0000 mg | ORAL_TABLET | Freq: Every day | ORAL | Status: DC
  Administered 2018-09-07 – 2018-09-09 (×3): 20 mg via ORAL
  Filled 2018-09-07 (×3): qty 1

## 2018-09-07 MED ORDER — SODIUM CHLORIDE 0.9% FLUSH
10.0000 mL | INTRAVENOUS | Status: DC | PRN
  Administered 2018-09-13: 30 mL
  Administered 2018-09-15: 10 mL
  Filled 2018-09-07 (×2): qty 40

## 2018-09-07 NOTE — Progress Notes (Signed)
[facility] Progress Note Patient Name: Jeremiah [name] DOB: 10/25/1945 MRN: [id number]   Date of Service  09/07/2018  HPI/Events of Note  Hypotension - BP = 56/39 after getting Fentanyl 50 mcg IV.   eICU Interventions  Will order: 1. Phenylephrine IV infusion. Titrate to MAP > 65.  2. Decrease Fentanyl dose to 25 mcg IV Q 1 hour PRN pain/agitation.      Intervention Category Major Interventions: Hypotension - evaluation and management  Sommer,Steven Eugene 09/07/2018, 3:16 AM

## 2018-09-07 NOTE — Progress Notes (Signed)
NAME:  Evangelos Paulino, MRN:  [id number], DOB:  1945/06/01, LOS: 7 ADMISSION DATE:  08/31/2018, CONSULTATION DATE:  08/31/18 REFERRING MD:  [facility] - [facility]  CHIEF COMPLAINT:  SOB   Brief History   Mavis Fichera is a 73 y.o. male who presented to [facility] with SOB felt to be due to AECOPD.  Required intubation after failing NIMV.  Incidentally found to have small LLL PE and small 64mm left frontal lobe hemorrhage; therefore, transferred to [facility].  History of present illness   Pt is encephelopathic; therefore, this HPI is obtained from chart review. Cleofas Hudgins is a 73 y.o. male who has a PMH as outlined below including Gold III COPD for which he is followed by Dr. Chase Caller (see "past medical history").  He presented to [facility] 5[facility]18 with SOB.  He was placed on NIMV but failed and required intubation.  In ED, had significant bronchospasm, felt to be due to AECOPD. Per wife, SOB had began 1 week prior and progressively worsened.  CTA chest with incidental finding small LLL PE.  CT head with incidental finding small 38mm left frontal lobe hemorrhage.  Heparin deferred in lieu of this.  COVID negative. In addition, EKG demonstrated J point elevation.  Discussed with cardiology who recommended trending troponins.  No emergent procedure indicated. Pt transferred to [facility] for further evaluation and management.  Past Medical History  COPD, OSA, HTN, PTSD, Depression.  Significant Hospital Events   5/18 > admit.  Consults:  None.  Procedures:  ETT 5/18>>>  Significant Diagnostic Tests:  CXR 5/18 > COPD. CT head 5/18 > 87mm subcortical hemorrhage in left frontal lobe.  Chronic white matter disease, question amyloid angiopathy. CTA chest 5/18 > single subsegmental PE in LLL.  Emphysema.  LE duplex 5/18 > negative for DVT. Echo 5/19 > EF 20-25%, stress cardiomopathy. MRI brain 5/20 > Acute 73mm hemorrhage, scattered small hemorrhages consistent with hypertension or amyloid angiopathy.  Micro Data:  Blood 5/18 > Coag  negative staph Blood ID panel with Meth resis coag neg staph  Sputum 5/18 > neg RVP 5/18 > neg SARS CoV2 5/18 > neg Blood 5/22>>>pending  Antimicrobials:  Azithromycin 5/18 x 1 Ceftriaxone 5/18 > 5/22 Cefepime 5/22>>> 5/25 Vancomycin 5/22>>> 5/25  Interim history/subjective:  Tolerating SBT this AM. Called and spoke with PCP and brother-in-law physiciain this am. Care discussed with cardiology. Hypotensive overnight. Holding BP HR regimen   Objective:  Blood pressure 111/81, pulse 88, temperature 98.6 F (37 C), resp. rate (!) 26, height 6\' 3"  (1.905 m), weight 69.5 kg, SpO2 95 %.    Vent Mode: PRVC FiO2 (%):  [30 %-40 %] 30 % Set Rate:  [12 bmp] 12 bmp Vt Set:  [370 mL-670 mL] 670 mL PEEP:  [5 cmH20] 5 cmH20 Plateau Pressure:  [14 cmH20-17 cmH20] 17 cmH20   Intake/Output Summary (Last 24 hours) at 09/07/2018 1026 Last data filed at 09/07/2018 1000 Gross per 24 hour  Intake 2328.17 ml  Output 2695 ml  Net -366.83 ml   Filed Weights   09/05/18 0343 09/06/18 0500 09/07/18 0500  Weight: 73.4 kg 73.4 kg 69.5 kg   Examination:  General: sedated, on vent, not following commands  Neuro: on precedex, responds to pain HEENT: NCAT, sclera clear, ETT inplace  Cardiovascular: RRR, s1 s2, no mrg  Lungs: BL vented breath sounds  Abdomen: soft, nt nd, bs present  Musculoskeletal: no LE edema  Skin: no rash   Assessment & Plan:   Acute respiratory failure requiring mechanical  ventilation. Failed extubation X1, lasted about 8 hours  Had some upper airway stridor  Plan - precedex continued for sedation  Weaning again today  Unable to liberate from vent this am On moderate PS still  Family discussion would NOT want trach if it comes to this.   AECOPD Elevated WBC Plan: Schedule duonebs  Added brovanna and pulmicort  Abx stopped  Observe temp curve off abx Would reculture if needed   Acute Systolic Heart Failure  Stress cardiomyopathy with EF 25 %. Optimized heart  failure will help extubation. - HTN PTA  Plan: Holding diuresis due to rise in Scr  Added NEpi if needed for presure support  Suspect hypotension is from over diuresis and med effect  Holding BiDIL   Acute Renal Failure, AKI in setting of cardiomyopathy  Increasing Creatinine 5/22 Plan: - follow UOP - Follow BMET  - replete lytes as needed  - avoiding nephrotoxic drugs  Small LLL PE. - Echo and Duplex doppler results noted - on ASA 81mg   - holding heparin d/t head bleed   Small left frontal lobe hemorrhage. Possibly Hypertension related. Plan - medical management  - BP control   Hx HTN, HLD. - holding HF regimen d/t hypotension   Best Practice:  Diet: NPO. tube feeds. Moderate nutritional risk. Pain/Anxiety/Delirium protocol (if indicated): Propofol gtt / Fentanyl PRN.  RASS goal 0 to -1. VAP protocol (if indicated): In place DVT prophylaxis: SCD's. GI prophylaxis: PPI. Glucose control: SSI. Mobility: Bedrest. Code Status: Full. Family Communication: spoke with brother in law and PCP this AM  Disposition: ICU.  This patient is critically ill with multiple organ system failure; which, requires frequent high complexity decision making, assessment, support, evaluation, and titration of therapies. This was completed through the application of advanced monitoring technologies and extensive interpretation of multiple databases. During this encounter critical care time was devoted to patient care services described in this note for 42 minutes.   Garner Nash, DO [facility] 09/07/2018 11:21 AM  Personal pager: [phone] If unanswered, please page CCM On-call: [phone]

## 2018-09-07 NOTE — Progress Notes (Signed)
Progress Note  Patient Name: Courvoisier Hamblen Date of Encounter: 09/07/2018  Primary Cardiologist: Sanda Klein, MD   Subjective   Remains intubated and sedated. Hypotensive overnight and tentatively started on pressors, however is currently hypotensive not on pressors. Has had significant increase in creatinine today.  Inpatient Medications    Scheduled Meds: .  stroke: mapping our early stages of recovery book   Does not apply Once  . arformoterol  15 mcg Nebulization BID  . aspirin  81 mg Per Tube Daily  . budesonide (PULMICORT) nebulizer solution  0.5 mg Nebulization BID  . chlorhexidine gluconate (MEDLINE KIT)  15 mL Mouth Rinse BID  . Chlorhexidine Gluconate Cloth  6 each Topical Daily  . famotidine  20 mg Oral QHS  . ipratropium-albuterol  3 mL Nebulization TID  . mouth rinse  15 mL Mouth Rinse 10 times per day  . pravastatin  10 mg Per Tube q1800  . senna-docusate  1 tablet Oral BID   Continuous Infusions: . sodium chloride Stopped (09/07/18 0316)  . sodium chloride    . dexmedetomidine (PRECEDEX) IV infusion 0.4 mcg/kg/hr (09/07/18 1200)  . feeding supplement (VITAL AF 1.2 CAL) 65 mL/hr at 09/07/18 1200  . norepinephrine (LEVOPHED) Adult infusion Stopped (09/07/18 1126)   PRN Meds: acetaminophen **OR** acetaminophen (TYLENOL) oral liquid 160 mg/5 mL **OR** acetaminophen, albuterol, fentaNYL (SUBLIMAZE) injection   Vital Signs    Vitals:   09/07/18 1111 09/07/18 1115 09/07/18 1200 09/07/18 1216  BP: (!) 68/52 114/78 (!) 83/58   Pulse: 74 80 73   Resp: '17 19 14   '$ Temp: 97.9 F (36.6 C)  98.6 F (37 C)   TempSrc:   Bladder   SpO2: 92% 94% 92% 94%  Weight:      Height:        Intake/Output Summary (Last 24 hours) at 09/07/2018 1230 Last data filed at 09/07/2018 1200 Gross per 24 hour  Intake 2218.19 ml  Output 2620 ml  Net -401.81 ml   Last 3 Weights 09/07/2018 09/06/2018 09/05/2018  Weight (lbs) 153 lb 3.5 oz 161 lb 13.1 oz 161 lb 13.1 oz  Weight (kg)  69.5 kg 73.4 kg 73.4 kg      Telemetry    Sinus rhythm- Personally Reviewed  ECG    No recent EKG- Personally Reviewed  Physical Exam   GEN: Intubated, sedated.  Non-arousable Neck:  Mild JVD with HJR. Cardiac: RRR, no murmurs, rubs, or gallops.  Slightly laterally displaced PMI Respiratory:  Diffuse coarse breath sounds throughout.  No obvious rhonchi. GI: Soft, nontender, non-distended  MS: No edema; No deformity.  SCDs in place Neuro:   [place]  Lab 09/05/18 0329  09/06/18 0521 09/07/18 0308 09/07/18 0414  NA 144   < > 145 144 142  K 3.8   < > 4.1 3.8 3.4*  CL 107  --  104 103  --   CO2 24  --  25 29  --   GLUCOSE 127*  --  135* 162*  --   BUN 42*  --  54* 79*  --   CREATININE 1.34*  --  2.02* 2.49*  --   CALCIUM 9.1  --  9.6 9.3  --   GFRNONAA 53*  --  32* 25*  --   GFRAA >60  --  38* 29*  --   ANIONGAP 13  --  16* 12  --    < > = values in  this interval not displayed.     Hematology Recent Labs  Lab 09/05/18 0329  09/06/18 0521 09/07/18 0308 09/07/18 0414  WBC 19.5*  --  20.0* 16.6*  --   RBC 5.13  --  5.05 4.58  --   HGB 15.8   < > 15.4 14.0 14.3  HCT 47.9   < > 46.9 42.1 42.0  MCV 93.4  --  92.9 91.9  --   MCH 30.8  --  30.5 30.6  --   MCHC 33.0  --  32.8 33.3  --   RDW 12.9  --  13.0 13.1  --   PLT 123*  --  137* 143*  --    < > = values in this interval not displayed.    Cardiac Enzymes Recent Labs  Lab 08/31/18 1536 09/01/18 1439  TROPONINI 1.91* 1.29*   No results for input(s): TROPIPOC in the last 168 hours.   BNPNo results for input(s): BNP, PROBNP in the last 168 hours.   DDimer No results for input(s): DDIMER in the last 168 hours.   Radiology    Dg Chest Port 1 View  Result Date: 09/07/2018 CLINICAL DATA:  73 year old male with history of intubation. EXAM: PORTABLE CHEST 1 VIEW COMPARISON:  Chest x-ray 09/06/2018. FINDINGS: An endotracheal tube is in place with tip 5.4 cm above the carina. A  feeding tube is seen extending into the abdomen, however, the tip of the feeding tube extends below the lower margin of the image. Lung volumes are normal. No consolidative airspace disease. No pleural effusions. No pneumothorax. No pulmonary nodule or mass noted. Pulmonary vasculature and the cardiomediastinal silhouette are within normal limits. IMPRESSION: 1. Support apparatus, as above. 2. No radiographic evidence of acute cardiopulmonary disease. Electronically Signed   By: Vinnie Langton M.D.   On: 09/07/2018 04:42   Dg Chest Port 1 View  Result Date: 09/06/2018 CLINICAL DATA:  73 year old male with history of intubation. EXAM: PORTABLE CHEST 1 VIEW COMPARISON:  Chest x-ray 09/05/2018. FINDINGS: An endotracheal tube is in place with tip 5.1 cm above the carina. A feeding tube is seen extending into the abdomen, however, the tip of the feeding tube extends below the lower margin of the image. Linear scarring or subsegmental atelectasis in the right lung base. No consolidative airspace disease. No pleural effusions. No evidence of pulmonary edema. No pneumothorax. Heart size is normal. Upper mediastinal contours are within normal limits. IMPRESSION: 1. Support apparatus, as above. 2. Minimal subsegmental atelectasis in the right lung base. Otherwise, unremarkable radiographic appearance the chest, as above. Electronically Signed   By: Vinnie Langton M.D.   On: 09/06/2018 07:16    Cardiac Studies    2D Echo 08/31/2018: Severely reduced EF 25%.  Findings consistent with Takotsubo cardiomyopathy.  GRII DD with elevated LVEDP with diffuse hypokinesis.  Patient Profile     73 y.o. male with a history of hypertension, COPD, and obstructive sleep apneawith AECOPD requiring mechanical ventilation,who is being seen for evaluation of new LV systolic dysfunction, abnormal EKG, and elevated troponin;incidentally found to have a small subsegmental PE and intracranial bleed.Extubated and reintubated  09/01/2018. Now with worsening renal function.  Assessment & Plan    Principal Problem:   Acute respiratory failure with hypoxia (HCC) Active Problems:   Acute combined systolic and diastolic heart failure (HCC)   Dilated cardiomyopathy (HCC)   Acute renal failure with acute cortical necrosis (HCC)   COPD, frequent exacerbations (HCC)   Intracerebral hemorrhage   Postextubation  stridor   Pulmonary embolism (HCC)     Acute respiratory failure with hypoxia (HCC) -combination of COPD exacerbation/pneumonia and acute combined systolic and diastolic heart failure. Remains intubated and sedated.  Managed by CCM.  Long-term discussions had with family members as to potential plans going forward reference level of care after extubation     Dilated cardiomyopathy ([place]) /Acute combined systolic and diastolic heart failure ([place])  Echocardiographically appears to look like stress cardiomyopathy --however ultimately for long-term diagnostic purposes would be beneficial to have an ischemic evaluation.  Unfortunately with worsening renal function and multiorgan failure, would not be a candidate for evaluation at this time.  Would hold BiDil for now, however once blood pressures normalize, would try to restart in lieu of ARB for afterload reduction.  Obviously blood pressure not stable for beta-blocker especially in setting of COPD exacerbation.  For now we will hold off on further diuresis.  Does not seem to be too volume overloaded on exam despite having HJR.     Acute renal failure with acute cortical necrosis (HCC) -pretty significant increase in creatinine over 24 hours, however still appears to be making urine.  This may be related to hypotension and aggressive diuresis.  For now we will hold diuresis and hold antihypertensives to allow for renal perfusion.  Pressors if necessary (preferably Levophed).    Intracerebral hemorrhage -limits ability for anticoagulation.  Also less favorable for  invasive cardiac evaluation at this time.    Pulmonary embolism (HCC) -small pulmonary embolus noted.  No anticoagulation secondary to [place].    Discussed plan with pulmonary critical care team. Not much further to offer at this time.  Will follow and be available for assistance if needed.   For questions or updates, please contact [place] Please consult [email or web address] for contact info under        Signed, Glenetta Hew, MD  09/07/2018, 12:30 PM

## 2018-09-08 DIAGNOSIS — J9589 Other postprocedural complications and disorders of respiratory system, not elsewhere classified: Secondary | ICD-10-CM

## 2018-09-08 LAB — CBC WITH DIFFERENTIAL/PLATELET
Abs Immature Granulocytes: 0.14 10*3/uL — ABNORMAL HIGH (ref 0.00–0.07)
Basophils Absolute: 0 10*3/uL (ref 0.0–0.1)
Basophils Relative: 0 %
Eosinophils Absolute: 0.6 10*3/uL — ABNORMAL HIGH (ref 0.0–0.5)
Eosinophils Relative: 5 %
HCT: 43.7 % (ref 39.0–52.0)
Hemoglobin: 14.2 g/dL (ref 13.0–17.0)
Immature Granulocytes: 1 %
Lymphocytes Relative: 10 %
Lymphs Abs: 1.2 10*3/uL (ref 0.7–4.0)
MCH: 30.5 pg (ref 26.0–34.0)
MCHC: 32.5 g/dL (ref 30.0–36.0)
MCV: 93.8 fL (ref 80.0–100.0)
Monocytes Absolute: 1.9 10*3/uL — ABNORMAL HIGH (ref 0.1–1.0)
Monocytes Relative: 15 %
Neutro Abs: 8.7 10*3/uL — ABNORMAL HIGH (ref 1.7–7.7)
Neutrophils Relative %: 69 %
Platelets: 193 10*3/uL (ref 150–400)
RBC: 4.66 MIL/uL (ref 4.22–5.81)
RDW: 12.8 % (ref 11.5–15.5)
WBC: 12.6 10*3/uL — ABNORMAL HIGH (ref 4.0–10.5)
nRBC: 0 % (ref 0.0–0.2)

## 2018-09-08 LAB — BASIC METABOLIC PANEL
Anion gap: 12 (ref 5–15)
BUN: 98 mg/dL — ABNORMAL HIGH (ref 8–23)
CO2: 29 mmol/L (ref 22–32)
Calcium: 9.6 mg/dL (ref 8.9–10.3)
Chloride: 107 mmol/L (ref 98–111)
Creatinine, Ser: 2.01 mg/dL — ABNORMAL HIGH (ref 0.61–1.24)
GFR calc Af Amer: 38 mL/min — ABNORMAL LOW (ref >60)
GFR calc non Af Amer: 33 mL/min — ABNORMAL LOW (ref >60)
Glucose, Bld: 135 mg/dL — ABNORMAL HIGH (ref 70–99)
Potassium: 3.1 mmol/L — ABNORMAL LOW (ref 3.5–5.1)
Sodium: 148 mmol/L — ABNORMAL HIGH (ref 135–145)

## 2018-09-08 LAB — GLUCOSE, CAPILLARY
Glucose-Capillary: 109 mg/dL — ABNORMAL HIGH (ref 70–99)
Glucose-Capillary: 119 mg/dL — ABNORMAL HIGH (ref 70–99)
Glucose-Capillary: 122 mg/dL — ABNORMAL HIGH (ref 70–99)
Glucose-Capillary: 124 mg/dL — ABNORMAL HIGH (ref 70–99)
Glucose-Capillary: 126 mg/dL — ABNORMAL HIGH (ref 70–99)
Glucose-Capillary: 175 mg/dL — ABNORMAL HIGH (ref 70–99)
Glucose-Capillary: 32 mg/dL — CL (ref 70–99)

## 2018-09-08 MED ORDER — CHLORHEXIDINE GLUCONATE 0.12% ORAL RINSE (MEDLINE KIT)
15.0000 mL | Freq: Two times a day (BID) | OROMUCOSAL | Status: DC
  Administered 2018-09-08: 15 mL via OROMUCOSAL

## 2018-09-08 MED ORDER — POTASSIUM CHLORIDE 20 MEQ/15ML (10%) PO SOLN
40.0000 meq | Freq: Four times a day (QID) | ORAL | Status: AC
  Administered 2018-09-08 (×2): 40 meq
  Filled 2018-09-08 (×2): qty 30

## 2018-09-08 MED ORDER — GUAIFENESIN-DM 100-10 MG/5ML PO SYRP
15.0000 mL | ORAL_SOLUTION | ORAL | Status: DC | PRN
  Administered 2018-09-09: 01:00:00 15 mL
  Filled 2018-09-08 (×3): qty 15

## 2018-09-08 MED ORDER — CHLORHEXIDINE GLUCONATE 0.12 % MT SOLN
OROMUCOSAL | Status: AC
  Administered 2018-09-08: 15 mL via OROMUCOSAL
  Filled 2018-09-08: qty 15

## 2018-09-08 MED ORDER — INSULIN ASPART 100 UNIT/ML ~~LOC~~ SOLN
0.0000 [IU] | SUBCUTANEOUS | Status: DC
  Administered 2018-09-08: 1 [IU] via SUBCUTANEOUS
  Administered 2018-09-08: 05:00:00 2 [IU] via SUBCUTANEOUS
  Administered 2018-09-08 – 2018-09-09 (×3): 1 [IU] via SUBCUTANEOUS

## 2018-09-08 MED ORDER — ORAL CARE MOUTH RINSE
15.0000 mL | OROMUCOSAL | Status: DC
  Administered 2018-09-08 – 2018-09-09 (×8): 15 mL via OROMUCOSAL

## 2018-09-08 MED ORDER — BENZONATATE 100 MG PO CAPS
100.0000 mg | ORAL_CAPSULE | Freq: Three times a day (TID) | ORAL | Status: DC | PRN
  Filled 2018-09-08: qty 1

## 2018-09-08 NOTE — Progress Notes (Signed)
Assisted tele visit to patient with wife.  Vaibhav Fogleman R, RN  

## 2018-09-08 NOTE — Progress Notes (Signed)
[facility] Progress Note Patient Name: Dresden Lozito DOB: October 24, 1945 MRN: [id number]   Date of Service  09/08/2018  HPI/Events of Note  Blood glucose = 175.  eICU Interventions  Will order: 1. Q 4 hour sensitive Novolog SSI.     Intervention Category Major Interventions: Hyperglycemia - active titration of insulin therapy  Sommer,Steven Eugene 09/08/2018, 4:44 AM

## 2018-09-08 NOTE — Progress Notes (Addendum)
[facility] Progress Note Patient Name: Jeremiah [name] DOB: Jan 19, 1946 MRN: [id number]   Date of Service  09/08/2018  HPI/Events of Note  Cough - Patient requests home Tessalon. However, can't give Tessalon via enteric tube.   eICU Interventions  Will order: 1. Robitussin DM 15 mL per tube Q 4 hours PRN cough.      Intervention Category Major Interventions: Other:  Lysle Dingwall 09/08/2018, 11:42 PM

## 2018-09-08 NOTE — Progress Notes (Signed)
NAME:  Jeremiah [name], MRN:  [id number], DOB:  Feb 06, 1946, LOS: 8 ADMISSION DATE:  08/31/2018, CONSULTATION DATE:  08/31/18 REFERRING MD:  [facility] - [facility]  CHIEF COMPLAINT:  SOB   Brief History   Jeremiah [name] is a 73 y.o. male who presented to [facility] with SOB felt to be due to AECOPD.  Required intubation after failing NIMV.  Incidentally found to have small LLL PE and small 19mm left frontal lobe hemorrhage; therefore, transferred to [facility].  History of present illness   Pt is encephelopathic; therefore, this HPI is obtained from chart review. Jeremiah [name] is a 73 y.o. male who has a PMH as outlined below including Gold III COPD for which he is followed by Dr. Chase Caller (see "past medical history").  He presented to [facility] with SOB.  He was placed on NIMV but failed and required intubation.  In ED, had significant bronchospasm, felt to be due to AECOPD. Per wife, SOB had began 1 week prior and progressively worsened.  CTA chest with incidental finding small LLL PE.  CT head with incidental finding small 18mm left frontal lobe hemorrhage.  Heparin deferred in lieu of this.  COVID negative. In addition, EKG demonstrated J point elevation.  Discussed with cardiology who recommended trending troponins.  No emergent procedure indicated. Pt transferred to [facility] for further evaluation and management.  Past Medical History  COPD, OSA, HTN, PTSD, Depression.  Significant Hospital Events   5/18 > admit.  Consults:  None.  Procedures:  ETT 5/18>>>  Significant Diagnostic Tests:  CXR 5/18 > COPD. CT head 5/18 > 75mm subcortical hemorrhage in left frontal lobe.  Chronic white matter disease, question amyloid angiopathy. CTA chest 5/18 > single subsegmental PE in LLL.  Emphysema.  LE duplex 5/18 > negative for DVT. Echo 5/19 > EF 20-25%, stress cardiomopathy. MRI brain 5/20 > Acute 64mm hemorrhage, scattered small hemorrhages consistent with hypertension or amyloid angiopathy.  Micro Data:  Blood 5/18 > Coag  negative staph Blood ID panel with Meth resis coag neg staph  Sputum 5/18 > neg RVP 5/18 > neg SARS CoV2 5/18 > neg Blood 5/22>>>pending  Antimicrobials:  Azithromycin 5/18 x 1 Ceftriaxone 5/18 > 5/22 Cefepime 5/22>>> 5/25 Vancomycin 5/22>>> 5/25  Interim history/subjective:  No issues overnight. Did not require levo. BP stable. Some UOP 1.4L.   Objective:  Blood pressure 100/65, pulse 89, temperature 98.6 F (37 C), temperature source Axillary, resp. rate (!) 26, height 6\' 3"  (1.905 m), weight 69.2 kg, SpO2 100 %.    Vent Mode: PRVC FiO2 (%):  [30 %] 30 % Set Rate:  [12 bmp] 12 bmp Vt Set:  [670 mL] 670 mL PEEP:  [5 cmH20] 5 cmH20 Plateau Pressure:  [13 cmH20-22 cmH20] 22 cmH20   Intake/Output Summary (Last 24 hours) at 09/08/2018 0913 Last data filed at 09/08/2018 0800 Gross per 24 hour  Intake 1927.4 ml  Output 1335 ml  Net 592.4 ml   Filed Weights   09/06/18 0500 09/07/18 0500 09/08/18 0235  Weight: 73.4 kg 69.5 kg 69.2 kg   Examination:  General: sedated, on mech vent, no distress  Neuro: on precedex, following some commands this morning  HEENT: NCAT, sclera clear, tracking  Cardiovascular: RRR, s1 s2, no mrg  Lungs: bilateral ventilated breaths sounds  Abdomen: soft, nt nd bs present  Musculoskeletal: no LE edema  Skin: no rash   Assessment & Plan:   Acute respiratory failure requiring mechanical ventilation. Failed extubation X1, lasted about 8 hours  Had some upper airway stridor  Plan On continuous precedex Tolerating SBT for the time being  Need to medically optimize his HF and kidney disease before liberation from vent  Holding diuresis Repeat labs pending   AECOPD, resolved  Elevated WBC Plan: Continue scheduled duonebs, brovana, pulmicort  Holding off abx at this time  Acute Systolic Heart Failure  Stress cardiomyopathy with EF 25 %. Optimized heart failure will help extubation. - HTN PTA  Plan: Holding bidil due to hypotension If BP  and Cr better can consider restart low dose  Holding diuresis due to rising SCR  Would like to medically optimize before considering oneway extubation after discussion with family yesterday.   Acute Renal Failure, AKI in setting of cardiomyopathy  Increasing Creatinine 5/22 Plan: Repeat BMP Follow uop Family would not want dialysis per current discussion   Small LLL PE. - Echo and Duplex doppler results noted - holding heparin due to [place]  - continue ASA 81mg    Small left frontal lobe hemorrhage. Possibly Hypertension related. Plan Medical management and bp control   Hx HTN, HLD. Holding hr regimen due to ongoing hypotension   Best Practice:  Diet: NPO. tube feeds. Moderate nutritional risk. Pain/Anxiety/Delirium protocol (if indicated): Propofol gtt / Fentanyl PRN.  RASS goal 0 to -1. VAP protocol (if indicated): In place DVT prophylaxis: SCD's. GI prophylaxis: PPI. Glucose control: SSI. Mobility: Bedrest. Code Status: Full. Family Communication: brother-in-law is primary contact per staff (physician), also has wife Disposition: ICU.  This patient is critically ill with multiple organ system failure; which, requires frequent high complexity decision making, assessment, support, evaluation, and titration of therapies. This was completed through the application of advanced monitoring technologies and extensive interpretation of multiple databases. During this encounter critical care time was devoted to patient care services described in this note for 33 minutes.   Garner Nash, DO [facility] 09/08/2018 9:13 AM  Personal pager: [phone] If unanswered, please page CCM On-call: [phone]

## 2018-09-09 DIAGNOSIS — I5023 Acute on chronic systolic (congestive) heart failure: Secondary | ICD-10-CM

## 2018-09-09 DIAGNOSIS — I612 Nontraumatic intracerebral hemorrhage in hemisphere, unspecified: Secondary | ICD-10-CM

## 2018-09-09 LAB — CBC
HCT: 42.4 % (ref 39.0–52.0)
Hemoglobin: 13.6 g/dL (ref 13.0–17.0)
MCH: 30.6 pg (ref 26.0–34.0)
MCHC: 32.1 g/dL (ref 30.0–36.0)
MCV: 95.3 fL (ref 80.0–100.0)
Platelets: 194 10*3/uL (ref 150–400)
RBC: 4.45 MIL/uL (ref 4.22–5.81)
RDW: 12.9 % (ref 11.5–15.5)
WBC: 11.5 10*3/uL — ABNORMAL HIGH (ref 4.0–10.5)
nRBC: 0 % (ref 0.0–0.2)

## 2018-09-09 LAB — CULTURE, BLOOD (ROUTINE X 2)
Culture: NO GROWTH
Culture: NO GROWTH
Special Requests: ADEQUATE

## 2018-09-09 LAB — BASIC METABOLIC PANEL
Anion gap: 9 (ref 5–15)
BUN: 91 mg/dL — ABNORMAL HIGH (ref 8–23)
CO2: 28 mmol/L (ref 22–32)
Calcium: 9.3 mg/dL (ref 8.9–10.3)
Chloride: 114 mmol/L — ABNORMAL HIGH (ref 98–111)
Creatinine, Ser: 1.64 mg/dL — ABNORMAL HIGH (ref 0.61–1.24)
GFR calc Af Amer: 48 mL/min — ABNORMAL LOW (ref >60)
GFR calc non Af Amer: 42 mL/min — ABNORMAL LOW (ref >60)
Glucose, Bld: 133 mg/dL — ABNORMAL HIGH (ref 70–99)
Potassium: 3.7 mmol/L (ref 3.5–5.1)
Sodium: 151 mmol/L — ABNORMAL HIGH (ref 135–145)

## 2018-09-09 LAB — GLUCOSE, CAPILLARY
Glucose-Capillary: 119 mg/dL — ABNORMAL HIGH (ref 70–99)
Glucose-Capillary: 129 mg/dL — ABNORMAL HIGH (ref 70–99)
Glucose-Capillary: 131 mg/dL — ABNORMAL HIGH (ref 70–99)
Glucose-Capillary: 160 mg/dL — ABNORMAL HIGH (ref 70–99)
Glucose-Capillary: 58 mg/dL — ABNORMAL LOW (ref 70–99)
Glucose-Capillary: 78 mg/dL (ref 70–99)
Glucose-Capillary: 81 mg/dL (ref 70–99)

## 2018-09-09 MED ORDER — INSULIN ASPART 100 UNIT/ML ~~LOC~~ SOLN
0.0000 [IU] | SUBCUTANEOUS | Status: DC
  Administered 2018-09-10: 0 [IU] via SUBCUTANEOUS
  Administered 2018-09-12: 1 [IU] via SUBCUTANEOUS

## 2018-09-09 MED ORDER — DEXMEDETOMIDINE HCL IN NACL 400 MCG/100ML IV SOLN
0.4000 ug/kg/h | INTRAVENOUS | Status: DC
  Administered 2018-09-09 – 2018-09-10 (×3): 1.2 ug/kg/h via INTRAVENOUS
  Administered 2018-09-10 (×3): 1 ug/kg/h via INTRAVENOUS
  Administered 2018-09-11 (×2): 0.8 ug/kg/h via INTRAVENOUS
  Administered 2018-09-11: 1.2 ug/kg/h via INTRAVENOUS
  Administered 2018-09-11 – 2018-09-12 (×2): 0.8 ug/kg/h via INTRAVENOUS
  Filled 2018-09-09 (×12): qty 100

## 2018-09-09 MED ORDER — ISOSORB DINITRATE-HYDRALAZINE 20-37.5 MG PO TABS
0.5000 | ORAL_TABLET | Freq: Three times a day (TID) | ORAL | Status: DC
  Administered 2018-09-09 – 2018-09-10 (×4): 0.5 via ORAL
  Filled 2018-09-09 (×4): qty 0.5

## 2018-09-09 MED ORDER — POTASSIUM CL IN DEXTROSE 5% 20 MEQ/L IV SOLN: 20.0000 meq | Freq: Once | INTRAVENOUS | Status: DC

## 2018-09-09 MED ORDER — FUROSEMIDE 40 MG PO TABS
20.0000 mg | ORAL_TABLET | Freq: Every day | ORAL | Status: DC
  Administered 2018-09-10: 20 mg via ORAL
  Filled 2018-09-09: qty 1

## 2018-09-09 MED ORDER — HALOPERIDOL LACTATE 5 MG/ML IJ SOLN
5.0000 mg | Freq: Four times a day (QID) | INTRAMUSCULAR | Status: DC | PRN
  Administered 2018-09-09 – 2018-09-20 (×10): 5 mg via INTRAVENOUS
  Filled 2018-09-09 (×11): qty 1

## 2018-09-09 MED ORDER — DEXTROSE 50 % IV SOLN
INTRAVENOUS | Status: AC
  Administered 2018-09-09: 50 mL
  Filled 2018-09-09: qty 50

## 2018-09-09 MED ORDER — METOLAZONE 2.5 MG PO TABS
2.5000 mg | ORAL_TABLET | Freq: Once | ORAL | Status: AC
  Administered 2018-09-09: 11:00:00 2.5 mg via ORAL
  Filled 2018-09-09: qty 1

## 2018-09-09 MED ORDER — ORAL CARE MOUTH RINSE
15.0000 mL | Freq: Two times a day (BID) | OROMUCOSAL | Status: DC
  Administered 2018-09-09 – 2018-09-11 (×4): 15 mL via OROMUCOSAL

## 2018-09-09 NOTE — Progress Notes (Signed)
Assisted tele visit to patient with wife.  Wilkie Zenon Anderson, RN   

## 2018-09-09 NOTE — Procedures (Signed)
Extubation Procedure Note  Patient Details:   Name: Jeremiah [name] DOB: Dec 30, 1945 MRN: [id number]   Airway Documentation:    Vent end date: 09/09/18 Vent end time: 1035   Evaluation  O2 sats: stable throughout Complications: No apparent complications Patient did tolerate procedure well. Bilateral Breath Sounds: Diminished   Yes   Patient extubated per order to 3L [place] with no apparent complications. Positive cuff leak was noted prior to extubation. Patient is able to speak. Vitals are stable. RT will continue to monitor.   [name] [name] 09/09/2018, 10:39 AM

## 2018-09-09 NOTE — Progress Notes (Signed)
Inpatient Diabetes Program Recommendations  AACE/ADA: New Consensus Statement on Inpatient Glycemic Control (2015)  Target Ranges:  Prepandial:   less than 140 mg/dL      Peak postprandial:   less than 180 mg/dL (1-2 hours)      Critically ill patients:  140 - 180 mg/dL   Lab Results  Component Value Date   GLUCAP 160 (H) 09/09/2018   HGBA1C 5.6 08/31/2018    Review of Glycemic Control Results for Jeremiah [name], Jeremiah [name] (MRN [id number]) as of 09/09/2018 14:24  Ref. Range 09/08/2018 23:45 09/09/2018 03:52 09/09/2018 07:32 09/09/2018 11:30 09/09/2018 12:16  Glucose-Capillary Latest Ref Range: 70 - 99 mg/dL 124 (H) 119 (H) 131 (H) 58 (L) 160 (H)    Inpatient Diabetes Program Recommendations:   Hypoglycemia post correction. Please consider:  -Custom Novolog correction scale 0-5 units       151-200  1 unit      201-250  2 units      251-300  3 units      301-350  4 units      351-400  5 units  Thank you, Bethena Roys E. Darnice Comrie, RN, MSN, CDE  Diabetes Coordinator Inpatient Glycemic Control Team Team Pager [phone] (8am-5pm) 09/09/2018 2:28 PM

## 2018-09-09 NOTE — Progress Notes (Signed)
NAME:  Jeremiah [name], MRN:  [id number], DOB:  1945/11/27, LOS: 9 ADMISSION DATE:  08/31/2018, CONSULTATION DATE:  08/31/18 REFERRING MD:  Jeremiah [name] - [facility]  CHIEF COMPLAINT:  SOB   Brief History   Jeremiah [name] is a 73 y.o. male who presented to [facility] with SOB felt to be due to AECOPD.  Required intubation after failing NIMV.  Incidentally found to have small LLL PE and small 29mm left frontal lobe hemorrhage; therefore, transferred to [facility].  History of present illness   Pt is encephelopathic; therefore, this HPI is obtained from chart review. Jeremiah [name] is a 73 y.o. male who has a PMH as outlined below including Gold III COPD for which he is followed by Dr. Chase [name] (see "past medical history").  He presented to [facility] 5[facility]18 with SOB.  He was placed on NIMV but failed and required intubation.  In ED, had significant bronchospasm, felt to be due to AECOPD. Per wife, SOB had began 1 week prior and progressively worsened.  CTA chest with incidental finding small LLL PE.  CT head with incidental finding small 54mm left frontal lobe hemorrhage.  Heparin deferred in lieu of this.  COVID negative. In addition, EKG demonstrated J point elevation.  Discussed with cardiology who recommended trending troponins.  No emergent procedure indicated. Pt transferred to [facility] for further evaluation and management.  Past Medical History  COPD, OSA, HTN, PTSD, Depression.  Significant Hospital Events   5/18 > admit.  Consults:  None.  Procedures:  ETT 5/18>>>  Significant Diagnostic Tests:  CXR 5/18 > COPD. CT head 5/18 > 39mm subcortical hemorrhage in left frontal lobe.  Chronic white matter disease, question amyloid angiopathy. CTA chest 5/18 > single subsegmental PE in LLL.  Emphysema.  LE duplex 5/18 > negative for DVT. Echo 5/19 > EF 20-25%, stress cardiomopathy. MRI brain 5/20 > Acute 67mm hemorrhage, scattered small hemorrhages consistent with hypertension or amyloid angiopathy.  Micro Data:  Blood 5/18 > Coag  negative staph Blood ID panel with Meth resis coag neg staph  Sputum 5/18 > neg RVP 5/18 > neg SARS CoV2 5/18 > neg Blood 5/22>>>pending  Antimicrobials:  Azithromycin 5/18 x 1 Ceftriaxone 5/18 > 5/22 Cefepime 5/22>>> 5/25 Vancomycin 5/22>>> 5/25  Interim history/subjective:  Doing well this morning. Tolerating SBT SAT. I called and spoke with Dr. Wynetta [name] patients brother in law. Nursing staff have spoke with the patients wife this morning.   Objective:  Blood pressure (!) 82/60, pulse 80, temperature (!) 97.3 F (36.3 C), temperature source Axillary, resp. rate 20, height 6\' 3"  (1.905 m), weight 70.5 kg, SpO2 98 %.    Vent Mode: PSV;CPAP FiO2 (%):  [30 %] 30 % Set Rate:  [12 bmp] 12 bmp Vt Set:  [670 mL] 670 mL PEEP:  [5 cmH20] 5 cmH20 Pressure Support:  [8 cmH20] 8 cmH20 Plateau Pressure:  [20 cmH20-26 cmH20] 26 cmH20   Intake/Output Summary (Last 24 hours) at 09/09/2018 0947 Last data filed at 09/09/2018 0800 Gross per 24 hour  Intake 1923.4 ml  Output 1791 ml  Net 132.4 ml   Filed Weights   09/07/18 0500 09/08/18 0235 09/09/18 0459  Weight: 69.5 kg 69.2 kg 70.5 kg   Examination:  General: sedated on vent, comfortable Neuro: comfortable, following commands, no focal deficit  HEENT: NCAT, sclera clear Cardiovascular: RRR, s1 s2, no mrg  Lungs: bilateral vented breath sounds, no crackles, no wheeze  Abdomen: soft, nt nd bs present  Musculoskeletal: no LE edema  Skin: no  rash   Assessment & Plan:   Acute respiratory failure requiring mechanical ventilation. Failed extubation X1, lasted about 8 hours  Had some upper airway stridor  Plan Plans for liberation from the vent today  BIPAP PRN   AECOPD, resolved  Elevated WBC Plan: Duonebs, brovana, pulmicort   Acute Systolic Heart Failure  Stress cardiomyopathy with EF 25 %. Optimized heart failure will help extubation. - HTN PTA  Plan: Given dose of metolazone  Low dose lasix to start tomorrow Restarted  0.5 tabs of bidil TID   Acute Renal Failure, AKI in setting of cardiomyopathy  Plan: Follow UOP Follow BMET   Small LLL PE. - Echo and Duplex doppler results noted - holding heparin d/t ich  - continue asa   Small left frontal lobe hemorrhage. Possibly Hypertension related. Plan Med management and HTN control  Within range   Hx HTN, HLD. Restarted bidil   Best Practice:  Diet: NPO. tube feeds. Moderate nutritional risk. Pain/Anxiety/Delirium protocol (if indicated): Propofol gtt / Fentanyl PRN.  RASS goal 0 to -1. VAP protocol (if indicated): In place DVT prophylaxis: SCD's. GI prophylaxis: PPI. Glucose control: SSI. Mobility: Bedrest. Code Status: Full. Family Communication: I spoke with brother in law this AM  Disposition: ICU.   This patient is critically ill with multiple organ system failure; which, requires frequent high complexity decision making, assessment, support, evaluation, and titration of therapies. This was completed through the application of advanced monitoring technologies and extensive interpretation of multiple databases. During this encounter critical care time was devoted to patient care services described in this note for 32 minutes.   Garner Nash, DO [facility] 09/09/2018 9:47 AM  Personal pager: [phone] If unanswered, please page CCM On-call: [phone]

## 2018-09-10 LAB — GLUCOSE, CAPILLARY
Glucose-Capillary: 114 mg/dL — ABNORMAL HIGH (ref 70–99)
Glucose-Capillary: 120 mg/dL — ABNORMAL HIGH (ref 70–99)
Glucose-Capillary: 124 mg/dL — ABNORMAL HIGH (ref 70–99)
Glucose-Capillary: 134 mg/dL — ABNORMAL HIGH (ref 70–99)
Glucose-Capillary: 144 mg/dL — ABNORMAL HIGH (ref 70–99)
Glucose-Capillary: 148 mg/dL — ABNORMAL HIGH (ref 70–99)

## 2018-09-10 LAB — BASIC METABOLIC PANEL
Anion gap: 9 (ref 5–15)
BUN: 66 mg/dL — ABNORMAL HIGH (ref 8–23)
CO2: 33 mmol/L — ABNORMAL HIGH (ref 22–32)
Calcium: 9.7 mg/dL (ref 8.9–10.3)
Chloride: 110 mmol/L (ref 98–111)
Creatinine, Ser: 1.42 mg/dL — ABNORMAL HIGH (ref 0.61–1.24)
GFR calc Af Amer: 58 mL/min — ABNORMAL LOW (ref >60)
GFR calc non Af Amer: 50 mL/min — ABNORMAL LOW (ref >60)
Glucose, Bld: 113 mg/dL — ABNORMAL HIGH (ref 70–99)
Potassium: 3.3 mmol/L — ABNORMAL LOW (ref 3.5–5.1)
Sodium: 152 mmol/L — ABNORMAL HIGH (ref 135–145)

## 2018-09-10 LAB — PHOSPHORUS: Phosphorus: 3 mg/dL (ref 2.5–4.6)

## 2018-09-10 LAB — CBC
HCT: 44.9 % (ref 39.0–52.0)
Hemoglobin: 14.7 g/dL (ref 13.0–17.0)
MCH: 30.9 pg (ref 26.0–34.0)
MCHC: 32.7 g/dL (ref 30.0–36.0)
MCV: 94.5 fL (ref 80.0–100.0)
Platelets: 269 10*3/uL (ref 150–400)
RBC: 4.75 MIL/uL (ref 4.22–5.81)
RDW: 12.7 % (ref 11.5–15.5)
WBC: 13.2 10*3/uL — ABNORMAL HIGH (ref 4.0–10.5)
nRBC: 0 % (ref 0.0–0.2)

## 2018-09-10 LAB — MAGNESIUM: Magnesium: 2.7 mg/dL — ABNORMAL HIGH (ref 1.7–2.4)

## 2018-09-10 MED ORDER — JEVITY 1.2 CAL PO LIQD
1000.0000 mL | ORAL | Status: DC
  Administered 2018-09-10 – 2018-09-16 (×8): 1000 mL
  Filled 2018-09-10 (×16): qty 1000

## 2018-09-10 MED ORDER — FAMOTIDINE 20 MG PO TABS
20.0000 mg | ORAL_TABLET | Freq: Every day | ORAL | Status: DC
  Administered 2018-09-10 – 2018-09-20 (×10): 20 mg
  Filled 2018-09-10 (×10): qty 1

## 2018-09-10 MED ORDER — FREE WATER
200.0000 mL | Freq: Four times a day (QID) | Status: DC
  Administered 2018-09-10 – 2018-09-11 (×4): 200 mL

## 2018-09-10 MED ORDER — IPRATROPIUM BROMIDE 0.02 % IN SOLN
0.5000 mg | Freq: Four times a day (QID) | RESPIRATORY_TRACT | Status: DC
  Administered 2018-09-10 – 2018-09-13 (×12): 0.5 mg via RESPIRATORY_TRACT
  Filled 2018-09-10 (×13): qty 2.5

## 2018-09-10 MED ORDER — ISOSORB DINITRATE-HYDRALAZINE 20-37.5 MG PO TABS
0.5000 | ORAL_TABLET | Freq: Three times a day (TID) | ORAL | Status: DC
  Administered 2018-09-10 – 2018-09-13 (×9): 0.5
  Administered 2018-09-13: 09:00:00
  Administered 2018-09-14: 0.5
  Filled 2018-09-10 (×12): qty 0.5

## 2018-09-10 MED ORDER — FUROSEMIDE 20 MG PO TABS
20.0000 mg | ORAL_TABLET | Freq: Every day | ORAL | Status: DC
  Administered 2018-09-11 – 2018-09-21 (×10): 20 mg
  Filled 2018-09-10 (×10): qty 1

## 2018-09-10 NOTE — Progress Notes (Signed)
NAME:  Jeremiah [name], MRN:  [id number], DOB:  1946-02-12, LOS: 10 ADMISSION DATE:  08/31/2018, CONSULTATION DATE:  08/31/18 REFERRING MD:  [facility] - [facility]  CHIEF COMPLAINT:  SOB   Brief History   Jeremiah [name] is a 73 y.o. male who presented to [facility] with SOB felt to be due to AECOPD.  Required intubation after failing NIMV.  Incidentally found to have small LLL PE and small 78mm left frontal lobe hemorrhage; therefore, transferred to [facility].  History of present illness   Pt is encephelopathic; therefore, this HPI is obtained from chart review. Jeremiah [name] is a 73 y.o. male who has a PMH as outlined below including Gold III COPD for which he is followed by Dr. Chase Caller (see "past medical history").  He presented to [facility] 5/18 with SOB.  He was placed on NIMV but failed and required intubation.  In ED, had significant bronchospasm, felt to be due to AECOPD. Per wife, SOB had began 1 week prior and progressively worsened.  CTA chest with incidental finding small LLL PE.  CT head with incidental finding small 42mm left frontal lobe hemorrhage.  Heparin deferred in lieu of this.  COVID negative. In addition, EKG demonstrated J point elevation.  Discussed with cardiology who recommended trending troponins.  No emergent procedure indicated. Pt transferred to [facility] for further evaluation and management.  Past Medical History  COPD, OSA, HTN, PTSD, Depression.  Significant Hospital Events   5/18 > admit.  Consults:  None.  Procedures:  ETT 5/18>>> 5/27  Significant Diagnostic Tests:  CXR 5/18 > COPD. CT head 5/18 > 43mm subcortical hemorrhage in left frontal lobe.  Chronic white matter disease, question amyloid angiopathy. CTA chest 5/18 > single subsegmental PE in LLL.  Emphysema.  LE duplex 5/18 > negative for DVT. Echo 5/19 > EF 20-25%, stress cardiomopathy. MRI brain 5/20 > Acute 31mm hemorrhage, scattered small hemorrhages consistent with hypertension or amyloid angiopathy.  Micro Data:  Blood 5/18 >  Coag negative staph Blood ID panel with Meth resis coag neg staph  Sputum 5/18 > neg RVP 5/18 > neg SARS CoV2 5/18 > neg Blood 5/22>>>pending  Antimicrobials:  Azithromycin 5/18 x 1 Ceftriaxone 5/18 > 5/22 Cefepime 5/22>>> 5/25 Vancomycin 5/22>>> 5/25  Interim history/subjective:  Extubated 5/27 and tolerating Remains on precedex 1.0 Deemed unready to advance diet on SLP eval .   Objective:  Blood pressure 131/77, pulse 80, temperature 98.4 F (36.9 C), temperature source Oral, resp. rate 20, height 6\' 3"  (1.905 m), weight 66.5 kg, SpO2 96 %.    FiO2 (%):  [32 %] 32 %   Intake/Output Summary (Last 24 hours) at 09/10/2018 1037 Last data filed at 09/10/2018 1000 Gross per 24 hour  Intake 918.93 ml  Output 1800 ml  Net -881.07 ml   Filed Weights   09/08/18 0235 09/09/18 0459 09/10/18 0500  Weight: 69.2 kg 70.5 kg 66.5 kg   Examination:  General: ill appearing elderly man, in bed with restraints in place Neuro: awake, weak voice but moves ext well, oriented, follows commands HEENT OP clear, poor dentition, no stridor Cardiovascular: regular, no M Lungs: clear B, no wheeze Abdomen: soft, non-distended., + BS Musculoskeletal: no edema Skin: no rash   Assessment & Plan:   Acute respiratory failure requiring mechanical ventilation. Multifactorial Extubated 5/27 Plan Push pulm hygiene COPD management as below BiPAP OK if he were to require.   AECOPD, improved  Plan: Brovana + Pulmicort Atrovent nebs Systemic steroids completed  Acute Systolic Heart  Failure  Stress cardiomyopathy with EF 25 %. - HTN PTA  Plan: Will benefit from netaive fluid balance Lasix per tube > follow UOP, renal fxn, BP to insure tolerating Bidil restarted  Acute Renal Failure, AKI in setting of cardiomyopathy. Improving  Plan: Follow UOP and BMP, balance diuretics with renal fxn  Hypernatremia w diuresis Start free water 5/28  Agitated delirium. Denies any EtOH use On precedex,  attempting to wean, goal to off  Dysphagia Working with SLP, planning to defer PO intake 5/28, reassess 5/29  Small LLL PE. - Echo and Duplex doppler results noted Defer anticoagulation given ICH On ASA, tolerating  Small left frontal lobe hemorrhage. Possibly Hypertension related. Plan HTN control Tolerating ASA   Hx HTN, HLD. On Bidil, lasix, pravastatin   Best Practice:  Diet: NPO. TF Pain/Anxiety/Delirium protocol (if indicated): precedex, weaning VAP protocol (if indicated): n/a, completed DVT prophylaxis: SCD's. GI prophylaxis: pepcid Glucose control: SSI. Mobility: OOB w PT as tolerated Code Status: Full. Family Communication: discussed with brother in law 5/27 Disposition: ICU.    Independent CC time 33 minutes  Baltazar Apo, MD, PhD 09/10/2018, 10:58 AM [facility] [phone] or if no answer [phone]

## 2018-09-10 NOTE — Progress Notes (Signed)
Assisted tele visit to patient with daughter.  Jeremiah Harriman Anderson, RN   

## 2018-09-10 NOTE — Progress Notes (Signed)
Assisted tele visit to patient with wife.  Jeremiah [name] Anderson, RN   

## 2018-09-10 NOTE — Progress Notes (Addendum)
Nutrition Follow-up  DOCUMENTATION CODES:   Underweight  INTERVENTION:   Continue TF via Cortrak:   Change to Jevity 1.2 at 75 ml/h (1800 ml per day)   Provides 2160 kcal, 100 gm protein, 1458 ml free water daily  NUTRITION DIAGNOSIS:   Inadequate oral intake related to inability to eat as evidenced by NPO status.  Ongoing  GOAL:   Patient will meet greater than or equal to 90% of their needs  Met with TF  MONITOR:   TF tolerance, Labs, Skin, Diet advancement  ASSESSMENT:   73 year old male who presented to the ED on 5/18 with SOB. PMH of COPD, HTN, PTSD. Pt found to have Takotsubo cardiomyopathy/acute myocarditis, small LLL PE, and small left front lobe hemorrhage. Pt intubated in the ED. Pt was extubated to nasal cannula on 5/19. Pt reintubated later in the day on 5/19 for agitation and possible stridor.  Patient was extubated 5/27. S/P swallow evaluation with SLP today. Remains NPO with high aspiration risk.   Cortrak in place, receiving Vital AF 1.2 at 65 ml/h. Tolerating without difficulty.  Labs and medications reviewed.  Sodium 151 (H) on 5/27 CBG's: 114-124-120 today  Weight down 9.7 kg since admission I/O net +158 ml  Patient is now underweight with BMI=18.3   Diet Order:   Diet Order            Diet NPO time specified  Diet effective now              EDUCATION NEEDS:   No education needs have been identified at this time  Skin:  Skin Assessment: Reviewed RN Assessment  Last BM:  5/20 (type 7)  Height:   Ht Readings from Last 1 Encounters:  08/31/18 '6\' 3"'$  (1.905 m)    Weight:   Wt Readings from Last 1 Encounters:  09/10/18 66.5 kg    Ideal Body Weight:  89.1 kg  BMI:  Body mass index is 18.32 kg/m.  Estimated Nutritional Needs:   Kcal:  2100-2300  Protein:  100-115 grams  Fluid:  >/= 1.8 L    Molli Barrows, RD, LDN, [place] Pager [phone] After Hours Pager [phone]

## 2018-09-10 NOTE — Evaluation (Addendum)
Clinical/Bedside Swallow Evaluation Patient Details  Name: Jeremiah [name] MRN: [id number] Date of Birth: [birth year]  Today's Date: 09/10/2018 Time: SLP Start Time (ACUTE ONLY): 84 SLP Stop Time (ACUTE ONLY): 1050 SLP Time Calculation (min) (ACUTE ONLY): 30 min  Past Medical History:  Past Medical History:  Diagnosis Date  . COPD (chronic obstructive pulmonary disease) ([place])   . Depression   . Dilated cardiomyopathy ([place])   . Hypertension   . PTSD (post-traumatic stress disorder)   . Sleep apnea    Past Surgical History:  Past Surgical History:  Procedure Laterality Date  . HAMMER TOE SURGERY  April 20120   HPI:  73 year old male admitted 08/31/2018 to [facility] with SOB/ AECOPD, required intubation. Found to have small LLL PE and 56mm left frontal lobe hemorrhage. Transferred to [facility]. PMH: Gold III COPD, OSA, HTN, PTSD, depression.   Assessment / Plan / Recommendation Clinical Impression  Oral care was completed with suction. Slight thick bloody secretions suctioned from oral cavity. RN informed. Pt had difficulty following commands consistently during assessment. Aphonic voice quality noted, but strong volitional cough. Pt has cortrak placed at this time. Pt accepted ice chips and small boluses of puree without obvious oral issues, however, delayed swallow reflex is suspected, and pt respiratory rate was noted to increase with po trials. Pt is also at increased aspiration risk due to extended intubation and cognitive impairment. RN and MD informed of results and recommendations for NPO except ice chips after oral care. SLP will continue to follow to assess readiness for po intake and/or instrumental assessment.    SLP Visit Diagnosis: Dysphagia, unspecified (R13.10)    Aspiration Risk  Severe aspiration risk    Diet Recommendation Ice chips PRN after oral care   Medication Administration: Via alternative means    Other  Recommendations Oral Care Recommendations: Oral care QID Other  Recommendations: Have oral suction available   Follow up Recommendations (TBD)      Frequency and Duration min 2x/week  2 weeks       Prognosis Prognosis for Safe Diet Advancement: Good Barriers to Reach Goals: Cognitive deficits;Severity of deficits Barriers/Prognosis Comment: extended intubation, left frontal hemorrhage, confusion      Swallow Study   General Date of Onset: 08/31/18 HPI: 73 year old male admitted 08/31/2018 to [facility] with SOB/ AECOPD, required intubation. Found to have small LLL PE and 16mm left frontal lobe hemorrhage. Transferred to [facility]. PMH: Gold III COPD, OSA, HTN, PTSD, depression. Type of Study: Bedside Swallow Evaluation Previous Swallow Assessment: none Diet Prior to this Study: NPO Temperature Spikes Noted: No Respiratory Status: Room air History of Recent Intubation: Yes Length of Intubations (days): 10 days Date extubated: 09/09/18 Behavior/Cognition: Alert;Cooperative Oral Cavity Assessment: Dried secretions Oral Care Completed by SLP: Yes Oral Cavity - Dentition: Missing dentition Self-Feeding Abilities: Total assist Patient Positioning: Upright in bed Baseline Vocal Quality: Low vocal intensity;Breathy Volitional Cough: Strong Volitional Swallow: Unable to elicit    Oral/Motor/Sensory Function Overall Oral Motor/Sensory Function: Generalized oral weakness   Ice Chips Ice chips: Impaired Presentation: Spoon Pharyngeal Phase Impairments: Suspected delayed Swallow;Change in Vital Signs   Thin Liquid Thin Liquid: Not tested    Nectar Thick Nectar Thick Liquid: Not tested   Honey Thick Honey Thick Liquid: Not tested   Puree Puree: Impaired Pharyngeal Phase Impairments: Suspected delayed Swallow;Change in Vital Signs   Solid           Artice Bergerson B. Quentin Ore, [facility], [place] Speech Language Pathologist [phone]  Colon Flattery  Brown 09/10/2018,10:52 AM

## 2018-09-10 NOTE — Progress Notes (Signed)
RT note; RT found pt with oxygen off. SATs are 98 on room air. Will continue to leave oxygen off.

## 2018-09-11 LAB — BASIC METABOLIC PANEL
Anion gap: 10 (ref 5–15)
BUN: 55 mg/dL — ABNORMAL HIGH (ref 8–23)
CO2: 28 mmol/L (ref 22–32)
Calcium: 9.9 mg/dL (ref 8.9–10.3)
Chloride: 112 mmol/L — ABNORMAL HIGH (ref 98–111)
Creatinine, Ser: 1.43 mg/dL — ABNORMAL HIGH (ref 0.61–1.24)
GFR calc Af Amer: 57 mL/min — ABNORMAL LOW (ref >60)
GFR calc non Af Amer: 49 mL/min — ABNORMAL LOW (ref >60)
Glucose, Bld: 131 mg/dL — ABNORMAL HIGH (ref 70–99)
Potassium: 3.4 mmol/L — ABNORMAL LOW (ref 3.5–5.1)
Sodium: 150 mmol/L — ABNORMAL HIGH (ref 135–145)

## 2018-09-11 LAB — GLUCOSE, CAPILLARY
Glucose-Capillary: 113 mg/dL — ABNORMAL HIGH (ref 70–99)
Glucose-Capillary: 131 mg/dL — ABNORMAL HIGH (ref 70–99)
Glucose-Capillary: 132 mg/dL — ABNORMAL HIGH (ref 70–99)
Glucose-Capillary: 137 mg/dL — ABNORMAL HIGH (ref 70–99)
Glucose-Capillary: 87 mg/dL (ref 70–99)
Glucose-Capillary: 96 mg/dL (ref 70–99)
Glucose-Capillary: 97 mg/dL (ref 70–99)

## 2018-09-11 LAB — POCT I-STAT 7, (LYTES, BLD GAS, ICA,H+H)
Acid-Base Excess: 5 mmol/L — ABNORMAL HIGH (ref 0.0–2.0)
Bicarbonate: 28 mmol/L (ref 20.0–28.0)
Calcium, Ion: 1.42 mmol/L — ABNORMAL HIGH (ref 1.15–1.40)
HCT: 41 % (ref 39.0–52.0)
Hemoglobin: 13.9 g/dL (ref 13.0–17.0)
O2 Saturation: 96 %
Patient temperature: 99.4
Potassium: 3.6 mmol/L (ref 3.5–5.1)
Sodium: 150 mmol/L — ABNORMAL HIGH (ref 135–145)
TCO2: 29 mmol/L (ref 22–32)
pCO2 arterial: 37.7 mmHg (ref 32.0–48.0)
pH, Arterial: 7.481 — ABNORMAL HIGH (ref 7.350–7.450)
pO2, Arterial: 76 mmHg — ABNORMAL LOW (ref 83.0–108.0)

## 2018-09-11 LAB — CBC
HCT: 46.2 % (ref 39.0–52.0)
Hemoglobin: 14.9 g/dL (ref 13.0–17.0)
MCH: 30.4 pg (ref 26.0–34.0)
MCHC: 32.3 g/dL (ref 30.0–36.0)
MCV: 94.3 fL (ref 80.0–100.0)
Platelets: 307 10*3/uL (ref 150–400)
RBC: 4.9 MIL/uL (ref 4.22–5.81)
RDW: 12.7 % (ref 11.5–15.5)
WBC: 15.8 10*3/uL — ABNORMAL HIGH (ref 4.0–10.5)
nRBC: 0 % (ref 0.0–0.2)

## 2018-09-11 LAB — MAGNESIUM: Magnesium: 2.2 mg/dL (ref 1.7–2.4)

## 2018-09-11 MED ORDER — ORAL CARE MOUTH RINSE
15.0000 mL | Freq: Two times a day (BID) | OROMUCOSAL | Status: DC
  Administered 2018-09-12 – 2018-09-21 (×15): 15 mL via OROMUCOSAL

## 2018-09-11 MED ORDER — FREE WATER
300.0000 mL | Freq: Four times a day (QID) | Status: DC
  Administered 2018-09-11 – 2018-09-16 (×20): 300 mL

## 2018-09-11 MED ORDER — POTASSIUM CHLORIDE 20 MEQ/15ML (10%) PO SOLN
20.0000 meq | ORAL | Status: AC
  Administered 2018-09-11 (×2): 20 meq
  Filled 2018-09-11 (×2): qty 15

## 2018-09-11 MED ORDER — CHLORHEXIDINE GLUCONATE 0.12 % MT SOLN
15.0000 mL | Freq: Two times a day (BID) | OROMUCOSAL | Status: DC
  Administered 2018-09-11 – 2018-09-14 (×5): 15 mL via OROMUCOSAL
  Filled 2018-09-11 (×5): qty 15

## 2018-09-11 MED ORDER — QUETIAPINE FUMARATE 50 MG PO TABS
25.0000 mg | ORAL_TABLET | Freq: Two times a day (BID) | ORAL | Status: DC
  Administered 2018-09-11 (×2): 25 mg
  Filled 2018-09-11 (×2): qty 1

## 2018-09-11 MED ORDER — LORAZEPAM 2 MG/ML IJ SOLN
0.5000 mg | Freq: Once | INTRAMUSCULAR | Status: AC | PRN
  Administered 2018-09-11: 0.5 mg via INTRAVENOUS
  Filled 2018-09-11: qty 1

## 2018-09-11 NOTE — Progress Notes (Signed)
NAME:  Jeremiah [name], MRN:  [id number], DOB:  1946/01/23, LOS: 11 ADMISSION DATE:  08/31/2018, CONSULTATION DATE:  08/31/18 REFERRING MD:  [facility] - [facility]  CHIEF COMPLAINT:  SOB   Brief History   Jeremiah [name] is a 73 y.o. male who presented to [facility] with SOB felt to be due to AECOPD.  Required intubation after failing NIMV.  Incidentally found to have small LLL PE and small 46mm left frontal lobe hemorrhage; therefore, transferred to [facility].  History of present illness   Pt is encephelopathic; therefore, this HPI is obtained from chart review. Jeremiah [name] is a 73 y.o. male who has a PMH as outlined below including Gold III COPD for which he is followed by Dr. Chase Caller (see "past medical history").  He presented [facility] with SOB.  He was placed on NIMV but failed and required intubation.  In ED, had significant bronchospasm, felt to be due to AECOPD. Per wife, SOB had began 1 week prior and progressively worsened.  CTA chest with incidental finding small LLL PE.  CT head with incidental finding small 19mm left frontal lobe hemorrhage.  Heparin deferred in lieu of this.  COVID negative. In addition, EKG demonstrated J point elevation.  Discussed with cardiology who recommended trending troponins.  No emergent procedure indicated. Pt transferred to [facility] for further evaluation and management.  Past Medical History  COPD, OSA, HTN, PTSD, Depression.  Significant Hospital Events   5/18 > admit.  Consults:  None.  Procedures:  ETT 5/18>>> 5/27  Significant Diagnostic Tests:  CXR 5/18 > COPD. CT head 5/18 > 82mm subcortical hemorrhage in left frontal lobe.  Chronic white matter disease, question amyloid angiopathy. CTA chest 5/18 > single subsegmental PE in LLL.  Emphysema.  LE duplex 5/18 > negative for DVT. Echo 5/19 > EF 20-25%, stress cardiomopathy. MRI brain 5/20 > Acute 71mm hemorrhage, scattered small hemorrhages consistent with hypertension or amyloid angiopathy.  Micro Data:  Blood 5/18 >  Coag negative staph Blood ID panel with Meth resis coag neg staph  Sputum 5/18 > neg RVP 5/18 > neg SARS CoV2 5/18 > neg Blood 5/22>>>pending  Antimicrobials:  Azithromycin 5/18 x 1 Ceftriaxone 5/18 > 5/22 Cefepime 5/22>>> 5/25 Vancomycin 5/22>>> 5/25  Interim history/subjective:  Remains extubated, protecting airway Has not yet been cleared for oral diet by SLP Some agitation last night, received 0.5 mg Ativan and then was poorly responsive with some upper airway noise.  Now improving.  Precedex currently 0.7  Objective:  Blood pressure 106/74, pulse 81, temperature 97.8 F (36.6 C), temperature source Axillary, resp. rate (!) 23, height 6\' 3"  (1.905 m), weight 66.5 kg, SpO2 100 %.        Intake/Output Summary (Last 24 hours) at 09/11/2018 0811 Last data filed at 09/11/2018 0800 Gross per 24 hour  Intake 3115.68 ml  Output 1395 ml  Net 1720.68 ml   Filed Weights   09/08/18 0235 09/09/18 0459 09/10/18 0500  Weight: 69.2 kg 70.5 kg 66.5 kg   Examination:  General: Thin chronically ill, lying in bed comfortably, weak, restraints loosely in place Neuro: Awake, able to interact, follows commands, oriented to self, place.  Moves all extremities HEENT oropharynx clear, some upper airway noise and mucus, poor dentition Cardiovascular: Regular, distant, no murmur Lungs: Clear bilaterally, some referred upper airway noise, no wheezing Abdomen: Soft, nondistended, positive bowel sounds Musculoskeletal: No edema Skin: Dry, no rash  Assessment & Plan:   Acute respiratory failure requiring mechanical ventilation. Multifactorial Extubated  5/27 Plan Continue to push pulmonary hygiene SLP following COPD management as below Has not required BiPAP, would be acceptable to use if he were to develop increased work of breathing  AECOPD, improved  Plan: Brovana, Pulmicort, Atrovent nebs Systemic steroids completed Pulmonary hygiene as above Home regimen is Trelegy once daily.   Transition back to this when he is stronger  Acute Systolic Heart Failure  Stress cardiomyopathy with EF 25 %. - HTN PTA  Plan: Would like to continue to push for negative fluid balance.  Conservative with diuretics at this time given his hypernatremia, renal insufficiency. Continue Lasix per tube as ordered BiDil as ordered Home amlodipine currently on hold Will need a repeat TTE to assess for improvement in LVEF in few weeks  Acute Renal Failure, AKI in setting of cardiomyopathy. Improving  Plan: Continue to follow urine output, BMP Balance diuretics with renal function and sodium  Hypernatremia w diuresis Free water started 5/28, continue  Agitated delirium. Denies any EtOH use On Precedex, currently 0.7.  Wean as able Received Ativan x1 overnight, try to avoid given the associated suppressed mental status, change in respiratory status  Dysphagia Continue to work with SLP, currently n.p.o.  Small LLL PE. - Echo and Duplex doppler results noted Anticoagulation deferred given his ICH Continue aspirin  Small left frontal lobe hemorrhage. Possibly Hypertension related. Plan Hypertension control Tolerating aspirin No plans for systemic anticoagulation  Hx HTN, HLD. On Bidil, lasix, pravastatin  Home amlodipine on hold  Best Practice:  Diet: NPO. TF Pain/Anxiety/Delirium protocol (if indicated): precedex, weaning VAP protocol (if indicated): n/a, completed DVT prophylaxis: SCD's. GI prophylaxis: pepcid Glucose control: SSI. Mobility: OOB w PT as tolerated Code Status: Full. Family Communication: I updated Dr Lysle Morales, pt's brother-in-law, by phone 5/29 Disposition: ICU.    Independent CC time 32 minutes  Jeremiah Apo, MD, PhD 09/11/2018, 8:11 AM [facility] [phone] or if no answer [phone]

## 2018-09-11 NOTE — Progress Notes (Signed)
  Speech Language Pathology Treatment: Dysphagia  Patient Details Name: Jeremiah [name] MRN: [id number] DOB: 09-Jan-1946 Today's Date: 09/11/2018 Time: 9470-9628 SLP Time Calculation (min) (ACUTE ONLY): 12 min  Assessment / Plan / Recommendation Clinical Impression  Pt is easily aroused for PO trials, but cognitively has difficulty orally accepting boluses despite Max cues. Even when he opens his mouth to command, he starts biting down before the spoon or cup gets to his mouth, only allowing a single ice chip and a single spoonful of water to pass his lips. Multiple swallows and immediate, weak coughing is elicited. He is still not ready for POs. Will continue to follow for swallowing, but may wish to consider ordering an SLP cognitive-linguistic evaluation as well.   HPI HPI: 73 year old male admitted 08/31/2018 to [facility] with SOB/ AECOPD, required intubation. Found to have small LLL PE and 38mm left frontal lobe hemorrhage. Transferred to [facility]. PMH: Gold III COPD, OSA, HTN, PTSD, depression.      SLP Plan  Continue with current plan of care       Recommendations  Diet recommendations: NPO Medication Administration: Via alternative means                Oral Care Recommendations: Oral care QID Follow up Recommendations: (tba) SLP Visit Diagnosis: Dysphagia, unspecified (R13.10) Plan: Continue with current plan of care       [place] Honestee Revard 09/11/2018, 9:50 AM  Pollyann Glen, M.A. [place] Acute Environmental education officer [phone] Office [phone]

## 2018-09-11 NOTE — Progress Notes (Signed)
PCCM Interval Note  Remains on precedex, continues to be impulsive. Requires constant redirection / supervision.   Will add seroquel, consider uptitrate over next few days.   Baltazar Apo, MD, PhD 09/11/2018, 2:59 PM [facility] [phone] or if no answer [phone]

## 2018-09-11 NOTE — Progress Notes (Signed)
After receiving ativan @ 0440, pt was having audible wheezes, questionable stridor and was very difficult to arouse even to painful stimuli. Charge nurse and RT called to assess. PRN albuterol given and ELINK MD was notified. Pt slowly started becoming more responsive and was able to open his eyes and finally state his name. ABG obtained, WNL. After PRN albuterol, wheezing and WOB have decreased and pt is more alert. Will continue to monitor closely.  Clint Bolder, RN 09/11/18 7:01 AM

## 2018-09-11 NOTE — Evaluation (Signed)
Physical Therapy Evaluation Patient Details Name: Jeremiah [name] MRN: [id number] DOB: 11-06-1945 Today's Date: 09/11/2018   History of Present Illness  Pt adm to [facility] with respiratory failure due to copd exacerbation. Pt also found to have small LLL PE and small 68mm left frontal lobe hemorrhage. Transferred to [facility]. Pt intubated 5/18-5/27. Pt also with confusion. PMH - COPD, OSA, HTN, PTSD, Depression  Clinical Impression  Pt admitted with above diagnosis and presents to PT with functional limitations due to deficits listed below (See PT problem list). Pt needs skilled PT to maximize independence and safety to allow discharge to CIR prior to return home if family support available.      Follow Up Recommendations CIR;Supervision/Assistance - 24 hour    Equipment Recommendations  Other (comment)(To be determined)    Recommendations for Other Services       Precautions / Restrictions Precautions Precautions: Fall Restrictions Weight Bearing Restrictions: No      Mobility  Bed Mobility Overal bed mobility: Needs Assistance Bed Mobility: Supine to Sit     Supine to sit: Mod assist     General bed mobility comments: Assist to bring legs off of bed, elevate trunk into sitting and bring hips to EOB  Transfers Overall transfer level: Needs assistance Equipment used: Rolling walker (2 wheeled) Transfers: Sit to/from [facility] Sit to Stand: Mod assist Stand pivot transfers: Mod assist       General transfer comment: Assist to bring hips up and for balance. Pt slow to rise. Pivotal steps bed to chair with walker with assist for balance and support. Assist to properly place rt hand on walker  Ambulation/Gait                Stairs            Wheelchair Mobility    Modified Rankin (Stroke Patients Only) Modified Rankin (Stroke Patients Only) Pre-Morbid Rankin Score: No significant disability Modified Rankin: Moderately severe disability      Balance Overall balance assessment: Needs assistance Sitting-balance support: Bilateral upper extremity supported;Feet supported Sitting balance-Leahy Scale: Poor Sitting balance - Comments: Sat EOB x 15 minutes with min to min guard assist. At times pt leaning forward excessively and verbal cues and min assist to correct Postural control: Other (comment)(anterior lean) Standing balance support: Bilateral upper extremity supported Standing balance-Leahy Scale: Poor Standing balance comment: walker and mod to min assist for static standing. Stood x 1 minute with incr hip and trunk extension as time increased                             Pertinent Vitals/Pain Pain Assessment: Faces Faces Pain Scale: No hurt    Home Living Family/patient expects to be discharged to:: Private residence Living Arrangements: Spouse/significant other                    Prior Function Level of Independence: Needs assistance   Gait / Transfers Assistance Needed: Appears pt was independent with mobility. Pt unable to answer questions related to prior functional status           Hand Dominance        Extremity/Trunk Assessment   Upper Extremity Assessment Upper Extremity Assessment: Defer to OT evaluation    Lower Extremity Assessment Lower Extremity Assessment: Generalized weakness;RLE deficits/detail;LLE deficits/detail RLE Deficits / Details: grossly 3-/5 LLE Deficits / Details: grossly 3-/5       Communication   Communication:  Other (comment)(Pt with little communication other than occasional yes/no)  Cognition Arousal/Alertness: Awake/alert Behavior During Therapy: Restless Overall Cognitive Status: Difficult to assess Area of Impairment: Following commands;Problem solving;Safety/judgement                       Following Commands: Follows one step commands inconsistently Safety/Judgement: Decreased awareness of safety;Decreased awareness of deficits    Problem Solving: Slow processing;Decreased initiation;Difficulty sequencing;Requires verbal cues;Requires tactile cues General Comments: Pt not answering most questions so difficult to assess. Decr attention to rt      General Comments      Exercises     Assessment/Plan    PT Assessment Patient needs continued PT services  PT Problem List Decreased strength;Decreased activity tolerance;Decreased balance;Decreased mobility;Decreased cognition;Decreased knowledge of use of DME;Decreased safety awareness;Decreased knowledge of precautions       PT Treatment Interventions DME instruction;Gait training;Functional mobility training;Therapeutic activities;Therapeutic exercise;Balance training;Cognitive remediation;Patient/family education;Neuromuscular re-education    PT Goals (Current goals can be found in the Care Plan section)  Acute Rehab PT Goals Patient Stated Goal: not stated PT Goal Formulation: Patient unable to participate in goal setting Time For Goal Achievement: 09/25/18 Potential to Achieve Goals: Fair    Frequency Min 3X/week   Barriers to discharge        Co-evaluation               AM-PAC PT "6 Clicks" Mobility  Outcome Measure Help needed turning from your back to your side while in a flat bed without using bedrails?: A Lot Help needed moving from lying on your back to sitting on the side of a flat bed without using bedrails?: A Lot Help needed moving to and from a bed to a chair (including a wheelchair)?: A Lot Help needed standing up from a chair using your arms (e.g., wheelchair or bedside chair)?: A Lot Help needed to walk in hospital room?: Total Help needed climbing 3-5 steps with a railing? : Total 6 Click Score: 10    End of Session Equipment Utilized During Treatment: Gait belt(Used posey waist belt as gait belt) Activity Tolerance: Patient tolerated treatment well Patient left: in chair;with call bell/phone within reach;with chair alarm  set;with restraints reapplied Nurse Communication: Mobility status PT Visit Diagnosis: Unsteadiness on feet (R26.81);Other abnormalities of gait and mobility (R26.89);Difficulty in walking, not elsewhere classified (R26.2)    Time: 9179-1505 PT Time Calculation (min) (ACUTE ONLY): 52 min   Charges:   PT Evaluation $PT Eval Moderate Complexity: 1 Mod PT Treatments $Therapeutic Activity: 23-37 mins        [place] Pager [phone] Office [place] 09/11/2018, 5:21 PM

## 2018-09-11 NOTE — Progress Notes (Signed)
[place] Progress Note Patient Name: Jeremiah [name] DOB: 1946/02/24 MRN: [id number]   Date of Service  09/11/2018  HPI/Events of Note  Called to bedside due to patient being difficult to arouse, also with audible wheezing improved with nebulization. Appears to still be very lethargic but would respond to verbal stimuli. Pupils CBG 137. Patient received Ativan 0.5 mg IV about 3 hours ago.  eICU Interventions  Hold Precedex ABG 7.4/38/74 Continue to monitor     Intervention Category Major Interventions: Change in mental status - evaluation and management  Shona Needles Jacion Dismore 09/11/2018, 6:45 AM

## 2018-09-11 NOTE — Progress Notes (Signed)
Rehab Admissions Coordinator Note:  Patient was screened by Cleatrice Burke for appropriateness for an Inpatient Acute Rehab Consult per PT recs.  At this time, we are recommending Inpatient Rehab consult. Please place order for consult.  Cleatrice Burke RN MSN 09/11/2018, 7:12 PM  I can be reached at [phone].

## 2018-09-11 NOTE — Progress Notes (Signed)
Assisted tele visit to patient with family member.  Merrell Rettinger Anderson, RN   

## 2018-09-11 NOTE — Progress Notes (Signed)
[facility] ADULT ICU REPLACEMENT PROTOCOL FOR AM LAB REPLACEMENT ONLY  The patient does apply for the [facility] Adult ICU Electrolyte Replacment Protocol based on the criteria listed below:   1. Is GFR >/= 40 ml/min? Yes.    Patient's GFR today is 57 2. Is urine output >/= 0.5 ml/kg/hr for the last 6 hours? Yes.   Patient's UOP is 0.8 ml/kg/hr 3. Is BUN < 60 mg/dL? Yes.    Patient's BUN today is 55 4. Abnormal electrolyte(s): 3.4 5. Ordered repletion with: per protocol 6. If a panic level lab has been reported, has the CCM MD in charge been notified? Yes.  .   Physician:  Dr. Wilson Singer, Philis Nettle 09/11/2018 6:41 AM

## 2018-09-11 NOTE — Progress Notes (Signed)
[facility] Progress Note Patient Name: Jeremiah [name] DOB: 1945/07/30 MRN: [id number]   Date of Service  09/11/2018  HPI/Events of Note  Patient seen agitated and trying to get out of bed. Has xanax and trazodone as home meds.  eICU Interventions  Ordered one time dose of ativan 0.5 mg IV     Intervention Category Minor Interventions: Agitation / anxiety - evaluation and management  Judd Lien 09/11/2018, 4:30 AM

## 2018-09-12 DIAGNOSIS — R410 Disorientation, unspecified: Secondary | ICD-10-CM

## 2018-09-12 LAB — GLUCOSE, CAPILLARY
Glucose-Capillary: 159 mg/dL — ABNORMAL HIGH (ref 70–99)
Glucose-Capillary: 105 mg/dL — ABNORMAL HIGH (ref 70–99)
Glucose-Capillary: 93 mg/dL (ref 70–99)
Glucose-Capillary: 66 mg/dL — ABNORMAL LOW (ref 70–99)
Glucose-Capillary: 66 mg/dL — ABNORMAL LOW (ref 70–99)
Glucose-Capillary: 113 mg/dL — ABNORMAL HIGH (ref 70–99)

## 2018-09-12 LAB — BASIC METABOLIC PANEL
Anion gap: 10 (ref 5–15)
BUN: 43 mg/dL — ABNORMAL HIGH (ref 8–23)
CO2: 29 mmol/L (ref 22–32)
Calcium: 10.3 mg/dL (ref 8.9–10.3)
Chloride: 107 mmol/L (ref 98–111)
Creatinine, Ser: 1.53 mg/dL — ABNORMAL HIGH (ref 0.61–1.24)
GFR calc Af Amer: 53 mL/min — ABNORMAL LOW (ref >60)
GFR calc non Af Amer: 45 mL/min — ABNORMAL LOW (ref >60)
Glucose, Bld: 152 mg/dL — ABNORMAL HIGH (ref 70–99)
Potassium: 3.4 mmol/L — ABNORMAL LOW (ref 3.5–5.1)
Sodium: 146 mmol/L — ABNORMAL HIGH (ref 135–145)

## 2018-09-12 LAB — BRAIN NATRIURETIC PEPTIDE: B Natriuretic Peptide: 193.1 pg/mL — ABNORMAL HIGH (ref 0.0–100.0)

## 2018-09-12 LAB — MAGNESIUM: Magnesium: 2.1 mg/dL (ref 1.7–2.4)

## 2018-09-12 MED ORDER — QUETIAPINE FUMARATE 50 MG PO TABS
50.0000 mg | ORAL_TABLET | Freq: Two times a day (BID) | ORAL | Status: DC
  Administered 2018-09-12 – 2018-09-21 (×17): 50 mg
  Filled 2018-09-12 (×17): qty 1

## 2018-09-12 MED ORDER — DEXTROSE 50 % IV SOLN
INTRAVENOUS | Status: AC
  Administered 2018-09-12: 25 mL
  Filled 2018-09-12: qty 50

## 2018-09-12 MED ORDER — VALPROIC ACID 250 MG/5ML PO SOLN
250.0000 mg | Freq: Two times a day (BID) | ORAL | Status: DC
  Administered 2018-09-12 – 2018-09-14 (×5): 250 mg
  Filled 2018-09-12 (×5): qty 5

## 2018-09-12 MED ORDER — POTASSIUM CHLORIDE 20 MEQ/15ML (10%) PO SOLN
20.0000 meq | ORAL | Status: AC
  Administered 2018-09-12 (×2): 20 meq
  Filled 2018-09-12 (×2): qty 15

## 2018-09-12 NOTE — Evaluation (Signed)
Speech Language Pathology Evaluation Patient Details Name: Jeremiah [name] MRN: [id number] DOB: 03/29/1946 Today's Date: 09/12/2018 Time: 0952-1010 SLP Time Calculation (min) (ACUTE ONLY): 18 min  Problem List:  Patient Active Problem List   Diagnosis Date Noted  . Acute combined systolic and diastolic heart failure ([place]) 09/07/2018  . Dilated cardiomyopathy ([place]) 09/07/2018  . Acute renal failure with acute cortical necrosis ([place]) 09/07/2018  . Pulmonary embolism ([place])   . Postextubation stridor 09/01/2018  . Acute respiratory failure with hypoxia ([place]) 08/31/2018  . Intracerebral hemorrhage 08/31/2018  . Chronic rhinitis 03/02/2018  . Chronic cough 01/22/2017  . Constipation 01/01/2016  . Acute URI 03/13/2015  . Medicare annual wellness visit, subsequent 12/28/2014  . Essential hypertension 12/12/2014  . Generalized anxiety disorder 11/02/2014  . OSA (obstructive sleep apnea) 04/26/2014  . COPD, frequent exacerbations ([place]) 04/25/2013   Past Medical History:  Past Medical History:  Diagnosis Date  . COPD (chronic obstructive pulmonary disease) ([place])   . Depression   . Dilated cardiomyopathy ([place])   . Hypertension   . PTSD (post-traumatic stress disorder)   . Sleep apnea    Past Surgical History:  Past Surgical History:  Procedure Laterality Date  . HAMMER TOE SURGERY  April 20148   HPI:  73 year old male admitted 08/31/2018 to [facility] with SOB/ AECOPD, required intubation. Found to have small LLL PE and 34mm left frontal lobe hemorrhage. Transferred to [facility]. PMH: Gold III COPD, OSA, HTN, PTSD, depression.   Assessment / Plan / Recommendation Clinical Impression  Pt presents with cognitive-linguistic impairments that is likely to be a combination of acute neurological insults, possible baseline deficits (h/o PTSD, chronic changes to brain on MRI), sedating medications, and aspect of delirium. However, he does present as more alert and interactive with SLP compared to visit on  previous day. He exhibits increased verbal output, although with perseverative responses and occasional phonemic errors. Confrontational naming was 70% accurate with simple, familiar objects. Min cues were needed to follow simple commands when attentive, with more Mod cueing needed to sustain attention. He demonstrates orientation to person only, but given multiple choices, could identify that he was in a hospital. Pt would benefit from additional SLP f/u to address functional cognition and communication and continued differential diagnosis of abilities.     SLP Assessment  SLP Recommendation/Assessment: Patient needs continued Speech Lanaguage Pathology Services SLP Visit Diagnosis: Cognitive communication deficit (R41.841)    Follow Up Recommendations  Inpatient Rehab    Frequency and Duration min 2x/week  2 weeks      SLP Evaluation Cognition  Overall Cognitive Status: No family/caregiver present to determine baseline cognitive functioning Arousal/Alertness: Awake/alert Orientation Level: Oriented to person;Disoriented to place;Disoriented to time;Disoriented to situation Attention: Sustained Sustained Attention: Impaired Sustained Attention Impairment: Functional basic Problem Solving: Impaired Problem Solving Impairment: Functional basic Behaviors: Restless Safety/Judgment: Impaired       Comprehension  Auditory Comprehension Overall Auditory Comprehension: Impaired Yes/No Questions: Impaired Basic Biographical Questions: 76-100% accurate Commands: Impaired One Step Basic Commands: 75-100% accurate Conversation: Simple    Expression Expression Primary Mode of Expression: Verbal Verbal Expression Overall Verbal Expression: Impaired Initiation: No impairment Level of Generative/Spontaneous Verbalization: Sentence Naming: Impairment Confrontation: Impaired Verbal Errors: Phonemic paraphasias;Perseveration;Not aware of errors Non-Verbal Means of Communication: Not  applicable   Oral / Motor  Motor Speech Overall Motor Speech: Impaired Respiration: Within functional limits Articulation: Impaired Level of Impairment: Sentence Intelligibility: Intelligibility reduced Phrase: 50-74% accurate Sentence: 50-74% accurate   GO  Venita Sheffield Jaquala Fuller 09/12/2018, 10:44 AM  Pollyann Glen, M.A. [place] Acute Environmental education officer [phone] Office [phone]

## 2018-09-12 NOTE — Progress Notes (Signed)
Hypoglycemic Event  CBG: 66  Treatment: 1/2 amp dextrose IV  Symptoms:none noted  Possible Reasons for Event: tube feed held briefly while new bottle was ordered  Comments/MD notified: [place]    Guadalupe Dawn

## 2018-09-12 NOTE — Progress Notes (Signed)
Assisted tele visit to patient with wife.  Lilygrace Rodick, Janace Hoard, RN

## 2018-09-12 NOTE — Progress Notes (Signed)
NAME:  Jeremiah [name], MRN:  [id number], DOB:  08/06/1945, LOS: 12 ADMISSION DATE:  08/31/2018, CONSULTATION DATE:  08/31/18 REFERRING MD:  Jeremiah [name] - [facility]  CHIEF COMPLAINT:  SOB   Brief History   Jeremiah [name] is a 73 y.o. male who presented to [facility] with SOB felt to be due to AECOPD.  Required intubation after failing NIMV.  Incidentally found to have small LLL PE and small 18mm left frontal lobe hemorrhage; therefore, transferred to [facility].  History of present illness   Pt is encephelopathic; therefore, this HPI is obtained from chart review. Jeremiah [name] is a 73 y.o. male who has a PMH as outlined below including Gold III COPD for which he is followed by Dr. Chase [name] (see "past medical history").  He presented to [facility] with SOB.  He was placed on NIMV but failed and required intubation.  In ED, had significant bronchospasm, felt to be due to AECOPD. Per wife, SOB had began 1 week prior and progressively worsened.  CTA chest with incidental finding small LLL PE.  CT head with incidental finding small 68mm left frontal lobe hemorrhage.  Heparin deferred in lieu of this.  COVID negative. In addition, EKG demonstrated J point elevation.  Discussed with cardiology who recommended trending troponins.  No emergent procedure indicated. Pt transferred to [facility] for further evaluation and management.  Past Medical History  COPD, OSA, HTN, PTSD, Depression.  Significant Hospital Events   5/18 > admit.  Consults:  None.  Procedures:  ETT 5/18>>> 5/27  Significant Diagnostic Tests:  CXR 5/18 > COPD. CT head 5/18 > 2mm subcortical hemorrhage in left frontal lobe.  Chronic white matter disease, question amyloid angiopathy. CTA chest 5/18 > single subsegmental PE in LLL.  Emphysema.  LE duplex 5/18 > negative for DVT. Echo 5/19 > EF 20-25%, stress cardiomopathy. MRI brain 5/20 > Acute 19mm hemorrhage, scattered small hemorrhages consistent with hypertension or amyloid angiopathy.  Micro Data:  Blood 5/18 >  Coag negative staph Blood ID panel with Meth resis coag neg staph  Sputum 5/18 > neg RVP 5/18 > neg SARS CoV2 5/18 > neg Blood 5/22>>>pending  Antimicrobials:  Azithromycin 5/18 x 1 Ceftriaxone 5/18 > 5/22 Cefepime 5/22>>> 5/25 Vancomycin 5/22>>> 5/25  Interim history/subjective:  Still with intermittent agitation.  Remains on Precedex 0.8.  Received single dose of Haldol this morning 5/30 He did work with physical therapy, got up to a chair on 5/29   Objective:  Blood pressure 99/72, pulse 76, temperature 98.7 F (37.1 C), temperature source Oral, resp. rate (!) 22, height 6\' 3"  (1.905 m), weight 67.1 kg, SpO2 100 %.        Intake/Output Summary (Last 24 hours) at 09/12/2018 0915 Last data filed at 09/12/2018 0800 Gross per 24 hour  Intake 3259.03 ml  Output 1030 ml  Net 2229.03 ml   Filed Weights   09/09/18 0459 09/10/18 0500 09/12/18 0422  Weight: 70.5 kg 66.5 kg 67.1 kg   Examination:  General: Thin chronically ill, lying in bed, more lethargic today.  Restraints are still in place Neuro: He does wake to voice but is more difficult to understand due to lethargy.  Moves all extremities.  Some occasional impulsive activity trying to get up HEENT oropharynx clear, poor dentition Cardiovascular: Regular, distant, no murmur Lungs: There are bilaterally, some referred upper airway noise, no wheeze Abdomen: Soft, nondistended, positive bowel sounds Musculoskeletal: No edema Skin:, No rash  Assessment & Plan:   Acute respiratory failure requiring mechanical  ventilation. Multifactorial Extubated 5/27 Plan Push pulmonary hygiene Appreciate SLP input.  Has not been cleared for diet yet COPD management as below Has not required BiPAP.  Do not anticipate that he will need it but it is available  AECOPD, improved  Plan: Nebs as ordered  >> Brovana, Pulmicort, Atrovent  Completed systemic steroids, stopped to avoid any contribution to his delirium Pulmonary hygiene as  above Home regimen is Trelegy once daily.  Position back to this when he is stronger, can participate  Acute Systolic Heart Failure  Stress cardiomyopathy with EF 25 %. - HTN PTA  Plan: Would like to push for negative fluid balance.  Conservative with furosemide at this time given his renal insufficiency.  Hypernatremia is improving.  Consider uptitrate over the next several days BiDil as ordered Home amlodipine currently on hold Plan to repeat his TTE in a few weeks to assess for improvement in his cardiomyopathy  Acute Renal Failure, AKI in setting of cardiomyopathy.  Plan: Follow urine output, BMP Balance diuretics with his sodium, renal function.  Currently 4.1 L positive  Hypernatremia w diuresis Free water started 5/28, continue  Agitated delirium. Denies any EtOH use.  Family notes that he has some baseline dementia also a history of PTSD.  This is the main residual problem On Precedex, currently 0.8.  Goal wean as able.  He may benefit from a sitter as he appears to require frequent reorientation Avoid benzodiazepines, narcotics which can contribute to his confusion Seroquel started 5/29, uptitrate on 5/30 Haldol available, use sparingly Check ECG on 5/31 to assess QTC on medications above  Dysphagia SLP following.  Has not been cleared for oral diet Continue tube feeding  Small LLL PE. - Echo and Duplex doppler results noted Anticoagulation deferred due to [place] Tolerating aspirin  Small left frontal lobe hemorrhage. Possibly Hypertension related. Plan Hypertension control as below Tolerating aspirin No plan for systemic anticoagulation  Hx HTN, HLD. On Bidil, lasix, pravastatin  Home amlodipine on hold  Best Practice:  Diet: NPO. TF Pain/Anxiety/Delirium protocol (if indicated): precedex, weaning. seroquel  VAP protocol (if indicated): n/a, completed DVT prophylaxis: SCD's. GI prophylaxis: pepcid Glucose control: SSI. Mobility: OOB w PT as tolerated Code  Status: Full. Family Communication: I updated Dr Jeremiah [name], pt's brother-in-law, by phone 5/29 Disposition: ICU.    Independent CC time 32 minutes  Baltazar Apo, MD, PhD 09/12/2018, 9:15 AM [facility] [phone] or if no answer [phone]

## 2018-09-12 NOTE — Progress Notes (Signed)
  Speech Language Pathology Treatment: Dysphagia  Patient Details Name: Jeremiah [name] MRN: [id number] DOB: 12-Aug-1945 Today's Date: 09/12/2018 Time: 0354-6568 SLP Time Calculation (min) (ACUTE ONLY): 10 min  Assessment / Plan / Recommendation Clinical Impression  Pt has reduced ROM with mandibular opening but improved oral acceptance given Mod cues for sustained attention. More diagnostic boluses could be administered today. His oropharyngeal swallow outwardly appeared to have reduced coordination, followed by delayed coughing. He is still not ready for a PO diet, but may be progressing closer to an instrumental swallow study. Would focus heavily on oral care given coating on tongue, with use of a few single ice chips after oral care to facilitate clearing and moistening the oral and pharyngeal mucosa. Will continue to follow.   HPI HPI: 73 year old male admitted 08/31/2018 to [facility] with SOB/ AECOPD, required intubation. Found to have small LLL PE and 35mm left frontal lobe hemorrhage. Transferred to [facility]. PMH: Gold III COPD, OSA, HTN, PTSD, depression.      SLP Plan  Continue with current plan of care       Recommendations  Diet recommendations: NPO;Other(comment)(ice chips with oral care) Medication Administration: Via alternative means                Oral Care Recommendations: Oral care QID Follow up Recommendations: Inpatient Rehab SLP Visit Diagnosis: Dysphagia, unspecified (R13.10) Plan: Continue with current plan of care       [place] Kresha Abelson 09/12/2018, 10:34 AM  Pollyann Glen, M.A. [place] Acute Environmental education officer [phone] Office [phone]

## 2018-09-12 NOTE — Progress Notes (Signed)
Pt increasingly confused and irritable, pulling at lines and attempting to get out of bed. MD notified, obtained order for scheduled depakote. Ambulated pt in room 2 assist w. RNs and MD with walker and gait belt. Pt did well with frequent redirection. RN and NT unable to leave pt alone in room, safety sitter ordered but unavailable. Will continue to monitor.

## 2018-09-12 NOTE — Progress Notes (Signed)
On initial assessment this morning pt was very agitated, removing gown and telemetry leads. Pt was only oriented to self. Assisted pt to void and after giving prn haldol and removing wrist and leg restraints was significantly more calm. Pt now resting comfortably. Will continue to monitor.

## 2018-09-13 LAB — BASIC METABOLIC PANEL
Anion gap: 12 (ref 5–15)
BUN: 32 mg/dL — ABNORMAL HIGH (ref 8–23)
CO2: 26 mmol/L (ref 22–32)
Calcium: 10 mg/dL (ref 8.9–10.3)
Chloride: 106 mmol/L (ref 98–111)
Creatinine, Ser: 1.5 mg/dL — ABNORMAL HIGH (ref 0.61–1.24)
GFR calc Af Amer: 54 mL/min — ABNORMAL LOW (ref >60)
GFR calc non Af Amer: 46 mL/min — ABNORMAL LOW (ref >60)
Glucose, Bld: 128 mg/dL — ABNORMAL HIGH (ref 70–99)
Potassium: 3.9 mmol/L (ref 3.5–5.1)
Sodium: 144 mmol/L (ref 135–145)

## 2018-09-13 LAB — GLUCOSE, CAPILLARY
Glucose-Capillary: 107 mg/dL — ABNORMAL HIGH (ref 70–99)
Glucose-Capillary: 111 mg/dL — ABNORMAL HIGH (ref 70–99)
Glucose-Capillary: 114 mg/dL — ABNORMAL HIGH (ref 70–99)
Glucose-Capillary: 120 mg/dL — ABNORMAL HIGH (ref 70–99)
Glucose-Capillary: 125 mg/dL — ABNORMAL HIGH (ref 70–99)
Glucose-Capillary: 95 mg/dL (ref 70–99)

## 2018-09-13 LAB — CBC
HCT: 45.6 % (ref 39.0–52.0)
Hemoglobin: 15 g/dL (ref 13.0–17.0)
MCH: 30.3 pg (ref 26.0–34.0)
MCHC: 32.9 g/dL (ref 30.0–36.0)
MCV: 92.1 fL (ref 80.0–100.0)
Platelets: 355 10*3/uL (ref 150–400)
RBC: 4.95 MIL/uL (ref 4.22–5.81)
RDW: 12.5 % (ref 11.5–15.5)
WBC: 17.5 10*3/uL — ABNORMAL HIGH (ref 4.0–10.5)
nRBC: 0 % (ref 0.0–0.2)

## 2018-09-13 LAB — MAGNESIUM: Magnesium: 2.1 mg/dL (ref 1.7–2.4)

## 2018-09-13 MED ORDER — SODIUM CHLORIDE 0.9 % IV SOLN
INTRAVENOUS | Status: DC | PRN
  Administered 2018-09-13 – 2018-09-14 (×2): 250 mL via INTRAVENOUS

## 2018-09-13 MED ORDER — ALTEPLASE 2 MG IJ SOLR
2.0000 mg | Freq: Once | INTRAMUSCULAR | Status: AC
  Administered 2018-09-13: 03:00:00 2 mg
  Filled 2018-09-13: qty 2

## 2018-09-13 MED ORDER — ALTEPLASE 2 MG IJ SOLR
2.0000 mg | Freq: Once | INTRAMUSCULAR | Status: AC
  Administered 2018-09-13: 2 mg
  Filled 2018-09-13: qty 2

## 2018-09-13 MED ORDER — IPRATROPIUM BROMIDE 0.02 % IN SOLN
0.5000 mg | Freq: Two times a day (BID) | RESPIRATORY_TRACT | Status: DC
  Administered 2018-09-13 – 2018-09-21 (×15): 0.5 mg via RESPIRATORY_TRACT
  Filled 2018-09-13 (×15): qty 2.5

## 2018-09-13 NOTE — Progress Notes (Signed)
Speech therapy in room working with pt

## 2018-09-13 NOTE — Progress Notes (Signed)
NAME:  Jeremiah [name], MRN:  [id number], DOB:  Sep 02, 1945, LOS: 58 ADMISSION DATE:  08/31/2018, CONSULTATION DATE:  08/31/18 REFERRING MD:  [place] - [facility]  CHIEF COMPLAINT:  SOB   Brief History   Jeremiah [name] is a 73 y.o. male who presented to [facility] with SOB felt to be due to AECOPD.  Required intubation after failing NIMV.  Incidentally found to have small LLL PE and small 25mm left frontal lobe hemorrhage; therefore, transferred to [facility].  History of present illness   Pt is encephelopathic; therefore, this HPI is obtained from chart review. Jeremiah [name] is a 73 y.o. male who has a PMH as outlined below including Gold III COPD for which he is followed by Dr. Chase Caller (see "past medical history").  He presented to [facility] with SOB.  He was placed on NIMV but failed and required intubation.  In ED, had significant bronchospasm, felt to be due to AECOPD. Per wife, SOB had began 1 week prior and progressively worsened.  CTA chest with incidental finding small LLL PE.  CT head with incidental finding small 47mm left frontal lobe hemorrhage.  Heparin deferred in lieu of this.  COVID negative. In addition, EKG demonstrated J point elevation.  Discussed with cardiology who recommended trending troponins.  No emergent procedure indicated. Pt transferred to [facility] for further evaluation and management.  Past Medical History  COPD, OSA, HTN, PTSD, Depression.  Significant Hospital Events   5/18 > admit.  Consults:  None.  Procedures:  ETT 5/18>>> 5/27  Significant Diagnostic Tests:  CXR 5/18 > COPD. CT head 5/18 > 66mm subcortical hemorrhage in left frontal lobe.  Chronic white matter disease, question amyloid angiopathy. CTA chest 5/18 > single subsegmental PE in LLL.  Emphysema.  LE duplex 5/18 > negative for DVT. Echo 5/19 > EF 20-25%, stress cardiomopathy. MRI brain 5/20 > Acute 27mm hemorrhage, scattered small hemorrhages consistent with hypertension or amyloid angiopathy.  Micro Data:  Blood 5/18 >  Coag negative staph Blood ID panel with Meth resis coag neg staph  Sputum 5/18 > neg RVP 5/18 > neg SARS CoV2 5/18 > neg Blood 5/22>>>pending  Antimicrobials:  Azithromycin 5/18 x 1 Ceftriaxone 5/18 > 5/22 Cefepime 5/22>>> 5/25 Vancomycin 5/22>>> 5/25  Interim history/subjective:  Still with intermittent agitation.  Remains on Precedex 0.8.  Received single dose of Haldol this morning 5/30 He did work with physical therapy, got up to a chair on 5/29   Objective:  Blood pressure (!) 140/94, pulse 95, temperature 97.7 F (36.5 C), temperature source Axillary, resp. rate (!) 25, height 6\' 3"  (1.905 m), weight 67.1 kg, SpO2 100 %.        Intake/Output Summary (Last 24 hours) at 09/13/2018 0934 Last data filed at 09/13/2018 0900 Gross per 24 hour  Intake 2383.06 ml  Output 1675 ml  Net 708.06 ml   Filed Weights   09/10/18 0500 09/12/18 0422 09/13/18 0331  Weight: 66.5 kg 67.1 kg 67.1 kg   Examination:  General: Thin chronically ill, lying in bed, more lethargic today.  Restraints are still in place Neuro: He does wake to voice but is more difficult to understand due to lethargy.  Moves all extremities.  Some occasional impulsive activity trying to get up HEENT oropharynx clear, poor dentition Cardiovascular: Regular, distant, no murmur Lungs: There are bilaterally, some referred upper airway noise, no wheeze Abdomen: Soft, nondistended, positive bowel sounds Musculoskeletal: No edema Skin:, No rash  Assessment & Plan:   Agitated delirium. Denies any  EtOH use.  Family notes that he has some baseline dementia also a history of PTSD.  This is the main residual problem Now off Precedex.  Goal wean as able.  He may benefit from a sitter as he appears to require frequent reorientation QTc 458 on 5/31. Avoid benzodiazepines, narcotics which can contribute to his confusion Seroquel started 5/29, uptitrate on 5/30 Haldol available, use sparingly  AECOPD, improved  Plan: Nebs  as ordered  >> Brovana, Pulmicort, Atrovent  Completed systemic steroids, stopped to avoid any contribution to his delirium Pulmonary hygiene as above Home regimen is Trelegy once daily.  Position back to this when he is stronger, can participate  Acute Systolic Heart Failure  Stress cardiomyopathy with EF 25 %. - HTN PTA  Plan: Would like to push for negative fluid balance.  Conservative with furosemide at this time given his renal insufficiency.  Hypernatremia is improving.  Consider uptitrate over the next several days BiDil as ordered Home amlodipine currently on hold Plan to repeat his TTE in a few weeks to assess for improvement in his cardiomyopathy  Acute Renal Failure, AKI in setting of cardiomyopathy.  Plan: Follow urine output, BMP Balance diuretics with his sodium, renal function.  Currently 4.1 L positive  Hypernatremia w diuresis Free water started 5/28, continue  Dysphagia SLP following.  Has not been cleared for oral diet Continue tube feeding  Small LLL PE. - Echo and Duplex doppler results noted Anticoagulation deferred due to [place] Tolerating aspirin  Small left frontal lobe hemorrhage. Possibly Hypertension related. Plan Hypertension control as below Tolerating aspirin No plan for systemic anticoagulation  Hx HTN, HLD. On Bidil, lasix, pravastatin  Home amlodipine on hold  Best Practice:  Diet: NPO. TF Pain/Anxiety/Delirium protocol (if indicated): precedex, weaning. seroquel  VAP protocol (if indicated): n/a, completed DVT prophylaxis: SCD's. GI prophylaxis: pepcid Glucose control: SSI. Mobility: OOB w PT as tolerated Code Status: Full. Family Communication: Dr Lamonte Sakai updated Dr Lysle Morales, pt's brother-in-law, by phone 5/29 Disposition: ICU.   Kipp Brood, MD [facility] ICU Physician [place]  Pager: [phone] Mobile: [phone] After hours: [phone].  09/13/2018, 9:40 AM

## 2018-09-13 NOTE — Progress Notes (Signed)
Pt condom cath replaced again, pt gown and pad changed.

## 2018-09-13 NOTE — Progress Notes (Signed)
Pt pulled up and readjusted in bed

## 2018-09-13 NOTE — Progress Notes (Signed)
IV team consulted to access occluded PICC line. tPA administration in lumens of line approved by [place] physician on call.   Jeremiah [name] A Jeremiah [name] 2:23 AM 09/13/18

## 2018-09-13 NOTE — Progress Notes (Signed)
  Speech Language Pathology Treatment: Dysphagia  Patient Details Name: Jeremiah [name] MRN: [id number] DOB: [birth year] Today's Date: 09/13/2018 Time: 0388-8280 SLP Time Calculation (min) (ACUTE ONLY): 16 min  Assessment / Plan / Recommendation Clinical Impression  Pt is drowsy this morning, perhaps related to medications as RN reports having recently received seroquel. He awakens enough to take ice chips and sips of water, but with intermittent coughing that follows. He is not alert enough to plan for MBS today, but suspect that this will be needed in time. Would focus still on frequent and thorough oral care given dried coating on tongue and teeth. Could offer a few single ice chips after oral care to facilitate moistening of his pharynx and clearance of secretions. Would only do this if fully alert.    HPI HPI: 73 year old male admitted 08/31/2018 to [facility] with SOB/ AECOPD, required intubation. Found to have small LLL PE and 57mm left frontal lobe hemorrhage. Transferred to [facility]. PMH: Gold III COPD, OSA, HTN, PTSD, depression.      SLP Plan  Continue with current plan of care       Recommendations  Diet recommendations: NPO(few ice chips after oral care) Medication Administration: Via alternative means                Oral Care Recommendations: Oral care QID Follow up Recommendations: Inpatient Rehab SLP Visit Diagnosis: Dysphagia, unspecified (R13.10) Plan: Continue with current plan of care       GO                Venita Sheffield Careem Yasui 09/13/2018, 10:47 AM  Pollyann Glen, M.A. [place] Acute Environmental education officer [phone] Office [phone]

## 2018-09-13 NOTE — Progress Notes (Signed)
In Progress  09/13/18 2:16 AM [place], Vida Roller, RN IVT consulted for occluded PICC line. Red port completely occluded, purple port able to flush with no blood return. Ordered tPA per protocol. Will await the ok from the on-call provider as patient has a dx of brain hemorrhage.

## 2018-09-13 NOTE — Progress Notes (Signed)
Pt pulled off condom cath, new condom cath placed

## 2018-09-14 ENCOUNTER — Inpatient Hospital Stay (HOSPITAL_COMMUNITY): Payer: Medicare Other

## 2018-09-14 DIAGNOSIS — F0390 Unspecified dementia without behavioral disturbance: Secondary | ICD-10-CM

## 2018-09-14 DIAGNOSIS — J9601 Acute respiratory failure with hypoxia: Secondary | ICD-10-CM

## 2018-09-14 DIAGNOSIS — F05 Delirium due to known physiological condition: Secondary | ICD-10-CM

## 2018-09-14 LAB — BASIC METABOLIC PANEL
Anion gap: 10 (ref 5–15)
BUN: 26 mg/dL — ABNORMAL HIGH (ref 8–23)
CO2: 27 mmol/L (ref 22–32)
Calcium: 9.9 mg/dL (ref 8.9–10.3)
Chloride: 104 mmol/L (ref 98–111)
Creatinine, Ser: 1.34 mg/dL — ABNORMAL HIGH (ref 0.61–1.24)
GFR calc Af Amer: >60 mL/min (ref >60)
GFR calc non Af Amer: 53 mL/min — ABNORMAL LOW (ref >60)
Glucose, Bld: 127 mg/dL — ABNORMAL HIGH (ref 70–99)
Potassium: 3.9 mmol/L (ref 3.5–5.1)
Sodium: 141 mmol/L (ref 135–145)

## 2018-09-14 LAB — GLUCOSE, CAPILLARY
Glucose-Capillary: 101 mg/dL — ABNORMAL HIGH (ref 70–99)
Glucose-Capillary: 103 mg/dL — ABNORMAL HIGH (ref 70–99)
Glucose-Capillary: 115 mg/dL — ABNORMAL HIGH (ref 70–99)
Glucose-Capillary: 116 mg/dL — ABNORMAL HIGH (ref 70–99)
Glucose-Capillary: 119 mg/dL — ABNORMAL HIGH (ref 70–99)
Glucose-Capillary: 133 mg/dL — ABNORMAL HIGH (ref 70–99)
Glucose-Capillary: 98 mg/dL (ref 70–99)

## 2018-09-14 LAB — CBC
HCT: 47.8 % (ref 39.0–52.0)
Hemoglobin: 15.6 g/dL (ref 13.0–17.0)
MCH: 30.4 pg (ref 26.0–34.0)
MCHC: 32.6 g/dL (ref 30.0–36.0)
MCV: 93.2 fL (ref 80.0–100.0)
Platelets: 379 10*3/uL (ref 150–400)
RBC: 5.13 MIL/uL (ref 4.22–5.81)
RDW: 12.4 % (ref 11.5–15.5)
WBC: 15.4 10*3/uL — ABNORMAL HIGH (ref 4.0–10.5)
nRBC: 0 % (ref 0.0–0.2)

## 2018-09-14 MED ORDER — PANTOPRAZOLE SODIUM 40 MG PO PACK
40.0000 mg | PACK | Freq: Every day | ORAL | Status: DC
  Administered 2018-09-15 – 2018-09-21 (×6): 40 mg
  Filled 2018-09-14 (×6): qty 20

## 2018-09-14 MED ORDER — TRAZODONE HCL 50 MG PO TABS
50.0000 mg | ORAL_TABLET | Freq: Every evening | ORAL | Status: DC | PRN
  Administered 2018-09-15 – 2018-09-20 (×5): 50 mg via ORAL
  Filled 2018-09-14 (×5): qty 1

## 2018-09-14 MED ORDER — TRAZODONE HCL 50 MG PO TABS: 50.0000 mg | ORAL_TABLET | Freq: Every evening | ORAL | Status: DC | PRN

## 2018-09-14 MED ORDER — ISOSORB DINITRATE-HYDRALAZINE 20-37.5 MG PO TABS
1.0000 | ORAL_TABLET | Freq: Three times a day (TID) | ORAL | Status: DC
  Administered 2018-09-14 – 2018-09-21 (×19): 1
  Filled 2018-09-14 (×20): qty 1

## 2018-09-14 MED ORDER — PANTOPRAZOLE SODIUM 40 MG PO TBEC
40.0000 mg | DELAYED_RELEASE_TABLET | Freq: Every day | ORAL | Status: DC
  Administered 2018-09-14: 40 mg via ORAL
  Filled 2018-09-14: qty 1

## 2018-09-14 NOTE — Progress Notes (Addendum)
Inpatient Rehabilitation Admissions Coordinator  Inpatient rehab consult received. Please note Dr. Naaman Plummer rehab consult today. I left a voicemail for Jeremiah [name]'s wife to contact me to begin discussions concerning his rehab venue options. I await Jeremiah [name]'s ability to tolerate more therapy before pursuing a possible inpt rehab admit.  Danne Baxter, RN, MSN Rehab Admissions Coordinator [phone] 09/14/2018 12:52 PM   I contacted Jeremiah [name]'s spouse by phone as well as his brother in [place], Dr. Wynetta Emery, per wife's request. I discussed goals and expectations of an inpt rehab admit. Jeremiah [name]'s lack of tolerance for more intense therapies as well as caregiver support at home for physical assistance is not available from his wife with her own medical issues.  Wife and Dr. Wynetta Emery do not want SNF due to Covid issues. I requested that Dr. Wynetta Emery work with his sister to arrange physical assist of both Jeremiah [name] and his wife at home as we await further progress with therapy. I will follow.  Danne Baxter, RN, MSN Rehab Admissions Coordinator [phone] 09/14/2018 2:53 PM

## 2018-09-14 NOTE — Progress Notes (Signed)
Assisted tele visit to patient with wife.  Maryelizabeth Rowan, RN

## 2018-09-14 NOTE — Progress Notes (Signed)
NAME:  Jeremiah [name], MRN:  [id number], DOB:  [birth year], LOS: 63 ADMISSION DATE:  08/31/2018, CONSULTATION DATE:  08/31/18 REFERRING MD:  [facility] - [facility]  CHIEF COMPLAINT:  SOB   Brief History   Jeremiah [name] is a 73 y.o. male who presented to [facility] with SOB felt to be due to AECOPD.  Required intubation after failing NIMV.  Incidentally found to have small LLL PE and small 81mm left frontal lobe hemorrhage; therefore, transferred to [facility].  History of present illness   Pt is encephelopathic; therefore, this HPI is obtained from chart review. Jeremiah [name] is a 73 y.o. male who has a PMH as outlined below including Gold III COPD for which he is followed by Dr. Chase [name] (see "past medical history").  He presented to [facility] with SOB.  He was placed on NIMV but failed and required intubation.  In ED, had significant bronchospasm, felt to be due to AECOPD. Per wife, SOB had began 1 week prior and progressively worsened.  CTA chest with incidental finding small LLL PE.  CT head with incidental finding small 63mm left frontal lobe hemorrhage.  Heparin deferred in lieu of this.  COVID negative. In addition, EKG demonstrated J point elevation.  Discussed with cardiology who recommended trending troponins.  No emergent procedure indicated. Pt transferred to [facility] for further evaluation and management.  Past Medical History  COPD, OSA, HTN, PTSD, Depression.  Significant Hospital Events   5/18 > admit.  Consults:  None.  Procedures:  ETT 5/18>>> 5/27  Significant Diagnostic Tests:  CXR 5/18 > COPD. CT head 5/18 > 58mm subcortical hemorrhage in left frontal lobe.  Chronic white matter disease, question amyloid angiopathy. CTA chest 5/18 > single subsegmental PE in LLL.  Emphysema.  LE duplex 5/18 > negative for DVT. Echo 5/19 > EF 20-25%, stress cardiomopathy. MRI brain 5/20 > Acute 43mm hemorrhage, scattered small hemorrhages consistent with hypertension or amyloid angiopathy.  Micro Data:  Blood 5/18 >  Coag negative staph Blood ID panel with Meth resis coag neg staph  Sputum 5/18 > neg RVP 5/18 > neg SARS CoV2 5/18 > neg Blood 5/22>>>pending  Antimicrobials:  Azithromycin 5/18 x 1 Ceftriaxone 5/18 > 5/22 Cefepime 5/22>>> 5/25 Vancomycin 5/22>>> 5/25  Interim history/subjective:  Off Precedex overnight.   Objective:  Blood pressure 127/81, pulse 98, temperature 99.9 F (37.7 C), temperature source Axillary, resp. rate (!) 21, height 6\' 3"  (1.905 m), weight 66.1 kg, SpO2 100 %.        Intake/Output Summary (Last 24 hours) at 09/14/2018 0919 Last data filed at 09/14/2018 0800 Gross per 24 hour  Intake 1970.8 ml  Output 1175 ml  Net 795.8 ml   Filed Weights   09/12/18 0422 09/13/18 0331 09/14/18 0456  Weight: 67.1 kg 67.1 kg 66.1 kg   Examination:  General: Thin chronically ill, lying in bed, more lethargic today.  Restraints are still in place Neuro: He does wake to voice but is more difficult to understand due to lethargy.  Moves all extremities.  Some occasional impulsive activity trying to get up HEENT oropharynx clear, poor dentition Cardiovascular: Regular, distant, no murmur Lungs: There are bilaterally, some referred upper airway noise, no wheeze Abdomen: Soft, nondistended, positive bowel sounds Musculoskeletal: No edema Skin:, No rash  Assessment & Plan:    Agitated delirium.Now off Precedex.   Denies any EtOH use.  Family notes that he has some baseline dementia also a history of PTSD.  This is the main residual problem  Goal wean as able.  He may benefit from a sitter as he appears to require frequent reorientation QTc 458 on 5/31. Avoid benzodiazepines, narcotics which can contribute to his confusion Seroquel started 5/29, uptitrate on 5/30 Haldol available, use sparingly Ready for transfer.   AECOPD, improved  Plan: Nebs as ordered  >> Brovana, Pulmicort, Atrovent  Completed systemic steroids, stopped to avoid any contribution to his delirium  Pulmonary hygiene as above Home regimen is Trelegy once daily.     Acute Systolic Heart Failure  Stress cardiomyopathy with EF 25 %. - HTN PTA  - Clinically euvolemic Plan: Continue current dose of furosemide Increase Bidil to 20/37.5 tid. Plan to repeat his TTE in a few weeks to assess for improvement in his cardiomyopathy  Acute Renal Failure, AKI in setting of cardiomyopathy. Improving creatinine. Plan: Continue to monitor on current HF treatment strategy.  Dysphagia SLP following.  Has not been cleared for oral diet Continue tube feeding  Small LLL PE. - Echo and Duplex doppler results noted Anticoagulation deferred due to [place] Tolerating aspirin  Small left frontal lobe hemorrhage. Possibly Hypertension related. Plan Hypertension control as below Tolerating aspirin No plan for systemic anticoagulation   Best Practice:  Diet: NPO. TF Pain/Anxiety/Delirium protocol (if indicated): . seroquel  VAP protocol (if indicated): n/a, completed DVT prophylaxis: SCD's. GI prophylaxis: pepcid Glucose control: SSI. Mobility: OOB w PT as tolerated Code Status: Full. Family Communication: Dr Lynetta Mare updated Dr Lysle Morales, pt's brother-in-law, by phone 6/1 Disposition: Ready for transfer.    35 min spent with >50% time in counseling and coordination of care.  Kipp Brood, MD [facility] ICU Physician [place]  Pager: [phone] Mobile: [phone] After hours: [phone].  09/14/2018, 9:19 AM

## 2018-09-14 NOTE — Progress Notes (Signed)
Physical Therapy Treatment Patient Details Name: Jeremiah [name] MRN: [id number] DOB: Aug 20, 1945 Today's Date: 09/14/2018    History of Present Illness Pt adm to [facility] with respiratory failure due to copd exacerbation. Pt also found to have small LLL PE and small 32mm left frontal lobe hemorrhage. Transferred to [facility]. Pt intubated 5/18-5/27. Pt also with confusion. PMH - COPD, OSA, HTN, PTSD, Depression    PT Comments    Pt less tolerant of activity today. Not sure he can tolerate CIR at this point so will change recommendation to SNF. If he shows increased tolerance and progresses in the next couple of days then could reconsider CIR.    Follow Up Recommendations  SNF;Supervision/Assistance - 24 hour     Equipment Recommendations  Other (comment)(TBD)    Recommendations for Other Services       Precautions / Restrictions Precautions Precautions: Fall Precaution Comments: pushes to the right (mild) Restrictions Weight Bearing Restrictions: No    Mobility  Bed Mobility Overal bed mobility: Needs Assistance Bed Mobility: Supine to Sit;Sit to Supine     Supine to sit: Mod assist Sit to supine: +2 for physical assistance;Total assist   General bed mobility comments: Assist to bring legs off of bed, elevate trunk into sitting and bring hips to EOB. Assist for all aspects returning to bed.  Transfers Overall transfer level: Needs assistance Equipment used: Rolling walker (2 wheeled) Transfers: Sit to/from Stand Sit to Stand: Mod assist;+2 physical assistance;+2 safety/equipment;From elevated surface         General transfer comment: Assist to bring hips up and for balance (use of gait belt and bed pad to assist with boost). Assist to properly place rt hand on walker  Ambulation/Gait                 Stairs             Wheelchair Mobility    Modified Rankin (Stroke Patients Only)       Balance Overall balance assessment: Needs assistance Sitting-balance  support: Bilateral upper extremity supported;Feet supported Sitting balance-Leahy Scale: Poor Sitting balance - Comments: Sat EOB x 15 minutes with mod to min guard assist. cues for Pt to go down on Left elbow to combat R lateral lean/push Postural control: Other (comment)(anterior lean) Standing balance support: Bilateral upper extremity supported Standing balance-Leahy Scale: Poor Standing balance comment: walker and mod A +2 to min A +2 assist for static standing. Stood x 1 minute with incr hip and trunk extension as time increased; increased fatigue from last session                            Cognition Arousal/Alertness: Awake/alert Behavior During Therapy: WFL for tasks assessed/performed Overall Cognitive Status: No family/caregiver present to determine baseline cognitive functioning Area of Impairment: Following commands;Problem solving;Safety/judgement;Orientation;Awareness;Attention                 Orientation Level: Place;Time Current Attention Level: Focused;Sustained(varied between levels)   Following Commands: Follows one step commands inconsistently;Follows one step commands with increased time Safety/Judgement: Decreased awareness of safety;Decreased awareness of deficits Awareness: Intellectual Problem Solving: Slow processing;Decreased initiation;Difficulty sequencing;Requires verbal cues;Requires tactile cues General Comments: would answer questions about 50% of the time, would sing song with OT, labile during session becoming tearful      Exercises      General Comments General comments (skin integrity, edema, etc.): VSS throughout session      Pertinent Vitals/Pain Pain  Assessment: No/denies pain Faces Pain Scale: No hurt    Home Living Family/patient expects to be discharged to:: Private residence Living Arrangements: Spouse/significant other             Additional Comments: Pt unreliable historian at this time    Prior Function  Level of Independence: Needs assistance  Gait / Transfers Assistance Needed: Pt unable to answer questions related to prior functional status       PT Goals (current goals can now be found in the care plan section) Acute Rehab PT Goals Patient Stated Goal: not stated Progress towards PT goals: Not progressing toward goals - comment    Frequency    Min 3X/week      PT Plan Discharge plan needs to be updated    Co-evaluation PT/OT/SLP Co-Evaluation/Treatment: Yes Reason for Co-Treatment: Complexity of the patient's impairments (multi-system involvement);For patient/therapist safety PT goals addressed during session: Mobility/safety with mobility;Balance;Proper use of DME OT goals addressed during session: ADL's and self-care      AM-PAC PT "6 Clicks" Mobility   Outcome Measure  Help needed turning from your back to your side while in a flat bed without using bedrails?: A Lot Help needed moving from lying on your back to sitting on the side of a flat bed without using bedrails?: A Lot Help needed moving to and from a bed to a chair (including a wheelchair)?: A Lot Help needed standing up from a chair using your arms (e.g., wheelchair or bedside chair)?: Total Help needed to walk in hospital room?: Total Help needed climbing 3-5 steps with a railing? : Total 6 Click Score: 9    End of Session Equipment Utilized During Treatment: Gait belt Activity Tolerance: Patient limited by fatigue Patient left: in bed;with call bell/phone within reach;with bed alarm set Nurse Communication: Mobility status PT Visit Diagnosis: Unsteadiness on feet (R26.81);Other abnormalities of gait and mobility (R26.89);Muscle weakness (generalized) (M62.81)     Time: 6503-5465 PT Time Calculation (min) (ACUTE ONLY): 40 min  Charges:  $Therapeutic Activity: 23-37 mins                     [place] Pager [phone] Office [place] 09/14/2018, 1:57 PM

## 2018-09-14 NOTE — Evaluation (Signed)
Occupational Therapy Evaluation Patient Details Name: Jeremiah [name] MRN: [id number] DOB: [birth year] Today's Date: 09/14/2018    History of Present Illness Pt adm to [facility] with respiratory failure due to copd exacerbation. Pt also found to have small LLL PE and small 36mm left frontal lobe hemorrhage. Transferred to [facility]. Pt intubated 5/18-5/27. Pt also with confusion. PMH - COPD, OSA, HTN, PTSD, Depression   Clinical Impression   Pt unreliable historian for PLOF. His cognition limits his communication (answers about 50% of questions) and from that he reports he was independent. Today Pt is NPO and overall max A for ADL. He is mod A +2 for transfers. He required mod-min A for seated balance EOB and kept his eyes closed almost throughout the entire session. Pt will benefit from skilled OT in the acute setting as well as afterwards at the SNF level to maximize safety and independence in ADL and functional transfers. Pt was compliant and cooperative throughout session although labile at times.     Follow Up Recommendations  SNF;Supervision/Assistance - 24 hour    Equipment Recommendations  Other (comment)(defer to next venue of care)    Recommendations for Other Services       Precautions / Restrictions Precautions Precautions: Fall Precaution Comments: pushes to the right (mild) Restrictions Weight Bearing Restrictions: No      Mobility Bed Mobility Overal bed mobility: Needs Assistance Bed Mobility: Supine to Sit     Supine to sit: Mod assist     General bed mobility comments: Assist to bring legs off of bed, elevate trunk into sitting and bring hips to EOB  Transfers Overall transfer level: Needs assistance Equipment used: Rolling walker (2 wheeled) Transfers: Sit to/from Stand Sit to Stand: Mod assist;+2 physical assistance;+2 safety/equipment;From elevated surface         General transfer comment: Assist to bring hips up and for balance (use of gait belt and bed pad to  assist with boost). Assist to properly place rt hand on walker    Balance Overall balance assessment: Needs assistance Sitting-balance support: Bilateral upper extremity supported;Feet supported Sitting balance-Leahy Scale: Poor Sitting balance - Comments: Sat EOB x 15 minutes with mod to min guard assist. cues for Pt to go down on Left elbow to combat R lateral lean/push Postural control: Other (comment)(anterior lean) Standing balance support: Bilateral upper extremity supported Standing balance-Leahy Scale: Poor Standing balance comment: walker and mod A +2 to min A +2 assist for static standing. Stood x 1 minute with incr hip and trunk extension as time increased; increased fatigue from last session                           ADL either performed or assessed with clinical judgement   ADL Overall ADL's : Needs assistance/impaired Eating/Feeding: NPO   Grooming: Wash/dry face;Moderate assistance;Sitting Grooming Details (indicate cue type and reason): mod A for thoroughness Upper Body Bathing: Maximal assistance;Sitting;Bed level   Lower Body Bathing: Total assistance;Bed level   Upper Body Dressing : Maximal assistance   Lower Body Dressing: Total assistance   Toilet Transfer: Moderate assistance;+2 for physical assistance;+2 for safety/equipment   Toileting- Clothing Manipulation and Hygiene: Total assistance;Bed level Toileting - Clothing Manipulation Details (indicate cue type and reason): unaware of BM, cleaned up at bedlevel at Pt fatigued and could not complete sit<>stand     Functional mobility during ADLs: Moderate assistance;+2 for physical assistance;+2 for safety/equipment;Rolling walker General ADL Comments: decreased cognition, activity tolerance, balance,  right lateral lean     Vision         Perception     Praxis      Pertinent Vitals/Pain Pain Assessment: No/denies pain Faces Pain Scale: No hurt     Hand Dominance     Extremity/Trunk  Assessment Upper Extremity Assessment Upper Extremity Assessment: RUE deficits/detail;LUE deficits/detail RUE Deficits / Details: delay with movement, grossly 4-/5 RUE Sensation: decreased proprioception RUE Coordination: decreased fine motor;decreased gross motor LUE Deficits / Details: generalized weakness   Lower Extremity Assessment Lower Extremity Assessment: Defer to PT evaluation   Cervical / Trunk Assessment Cervical / Trunk Assessment: Kyphotic   Communication Communication Communication: Other (comment);No difficulties(minimal communication, delayed responses, soft voice)   Cognition Arousal/Alertness: Awake/alert Behavior During Therapy: WFL for tasks assessed/performed Overall Cognitive Status: No family/caregiver present to determine baseline cognitive functioning Area of Impairment: Following commands;Problem solving;Safety/judgement;Orientation;Awareness;Attention                 Orientation Level: Place;Time Current Attention Level: Focused;Sustained(varied between levels)   Following Commands: Follows one step commands inconsistently;Follows one step commands with increased time Safety/Judgement: Decreased awareness of safety;Decreased awareness of deficits Awareness: Intellectual Problem Solving: Slow processing;Decreased initiation;Difficulty sequencing;Requires verbal cues;Requires tactile cues General Comments: would answer questions about 50% of the time, would sing song with OT, labile during session becoming tearful   General Comments  VSS throughout session    Exercises     Shoulder Instructions      Home Living Family/patient expects to be discharged to:: Private residence Living Arrangements: Spouse/significant other                               Additional Comments: Pt unreliable historian at this time      Prior Functioning/Environment Level of Independence: Needs assistance  Gait / Transfers Assistance Needed: Pt unable  to answer questions related to prior functional status              OT Problem List: Decreased strength;Decreased range of motion;Decreased activity tolerance;Impaired balance (sitting and/or standing);Decreased coordination;Decreased cognition;Decreased safety awareness;Decreased knowledge of use of DME or AE;Impaired UE functional use      OT Treatment/Interventions: Self-care/ADL training;DME and/or AE instruction;Therapeutic exercise;Neuromuscular education;Therapeutic activities;Patient/family education;Balance training    OT Goals(Current goals can be found in the care plan section) Acute Rehab OT Goals Patient Stated Goal: not stated OT Goal Formulation: Patient unable to participate in goal setting ADL Goals Pt Will Perform Grooming: with min guard assist;sitting Pt Will Transfer to Toilet: with min assist;stand pivot transfer;bedside commode Pt Will Perform Toileting - Clothing Manipulation and hygiene: with mod assist;sitting/lateral leans;sit to/from stand Additional ADL Goal #1: Pt will perform seated balance at EOB for 15 min at supervision level while performing ADL Additional ADL Goal #2: Pt will perform bed mobility at min guard level prior to engaging in ADL  OT Frequency: Min 2X/week   Barriers to D/C:            Co-evaluation PT/OT/SLP Co-Evaluation/Treatment: Yes Reason for Co-Treatment: Complexity of the patient's impairments (multi-system involvement);Necessary to address cognition/behavior during functional activity;For patient/therapist safety;To address functional/ADL transfers PT goals addressed during session: Mobility/safety with mobility;Proper use of DME;Balance OT goals addressed during session: ADL's and self-care      AM-PAC OT "6 Clicks" Daily Activity     Outcome Measure Help from another person eating meals?: Total(NPO) Help from another person taking care of personal grooming?: A Lot  Help from another person toileting, which includes using  toliet, bedpan, or urinal?: A Lot Help from another person bathing (including washing, rinsing, drying)?: A Lot Help from another person to put on and taking off regular upper body clothing?: A Lot Help from another person to put on and taking off regular lower body clothing?: Total 6 Click Score: 10   End of Session Equipment Utilized During Treatment: Gait belt;Rolling walker Nurse Communication: Mobility status;Precautions;Other (comment)(probably having a BM)  Activity Tolerance: Patient limited by fatigue Patient left: in bed;with call bell/phone within reach;with bed alarm set  OT Visit Diagnosis: Unsteadiness on feet (R26.81);Other abnormalities of gait and mobility (R26.89);Muscle weakness (generalized) (M62.81);Other symptoms and signs involving cognitive function;Other symptoms and signs involving the nervous system (R29.898);Hemiplegia and hemiparesis Hemiplegia - Right/Left: Right Hemiplegia - dominant/non-dominant: Dominant Hemiplegia - caused by: Nontraumatic intracerebral hemorrhage                Time: 1110-1141 OT Time Calculation (min): 31 min Charges:  OT General Charges $OT Visit: 1 Visit OT Evaluation $OT Eval Moderate Complexity: [place] OTR/L Acute Rehabilitation Services Pager: [phone] Office: [place] 09/14/2018, 12:21 PM

## 2018-09-14 NOTE — Consult Note (Signed)
Physical Medicine and Rehabilitation Consult Reason for Consult: Decreased functional mobility Referring Physician: Critical care   HPI: Jeremiah [name] is a 73 y.o. right-handed male with history of COPD followed by Dr. Chase Caller, sleep apnea, hypertension, PTSD.  Per chart review patient lives with spouse.  Reportedly independent prior to admission.  Presented 08/30/2020 [facility] with increasing shortness of breath.  Placed on noninvasive motion ventilation and required intubation.  In the ED patient had significant bronchospasm.  CTA of chest with incidental finding of small left lower lobe pulmonary emboli.  CT of the head showed a 5 mm acute subcortical hemorrhage in the left frontal lobe.  EKG showed ST elevation and PR depression.  Troponin 1.9 elevated from 0.7.  Transferred to [facility].  Intravenous heparin deferred secondary to subcortical hemorrhage.  Echocardiogram with ejection fraction of 25% severely reduced systolic function findings consistent with Takatsubo cardiomyopathy.  Lower extremity Dopplers negative for DVT.  Follow-up MRI of the brain 09/02/2018 again 5 mm focus of acute hemorrhage left frontal lobe associated with a small subcortical acute early subacute infarction.  Additional punctate foci of acute early subacute infarcts present in the left anterior frontal lobe and left temporal lobe.  MRA with no large vessel occlusion or aneurysm.  Patient with intermittent bouts of agitation and restlessness required Precedex.  EEG was negative for seizure.  Required intubation through 09/09/2018 and extubated.  Hospital course AKI with creatinine 1.34-1.53.  Bouts of hypotension requiring pressors.  Patient currently remains n.p.o. with alternative means of nutritional support.  Therapy evaluations completed with recommendations of physical medicine rehab consult.   Review of Systems  Unable to perform ROS: Acuity of condition   Past Medical History:   Diagnosis Date  . COPD (chronic obstructive pulmonary disease) ([place])   . Depression   . Dilated cardiomyopathy ([place])   . Hypertension   . PTSD (post-traumatic stress disorder)   . Sleep apnea    Past Surgical History:  Procedure Laterality Date  . HAMMER TOE SURGERY  April 2017   Family History  Problem Relation Age of Onset  . Asthma Maternal Grandmother   . Heart disease Father   . Dementia Mother    Social History:  reports that he quit smoking about 31 years ago. His smoking use included cigarettes. He has a 30.00 pack-year smoking history. He has never used smokeless tobacco. He reports current alcohol use of about 2.0 standard drinks of alcohol per week. He reports that he does not use drugs. Allergies: No Known Allergies Medications Prior to Admission  Medication Sig Dispense Refill  . albuterol (PROVENTIL HFA;VENTOLIN HFA) 108 (90 Base) MCG/ACT inhaler Inhale 2 puffs into the lungs every 4 (four) hours as needed for wheezing or shortness of breath (((PLAN B))). 1 Inhaler 5  . albuterol (PROVENTIL) (2.5 MG/3ML) 0.083% nebulizer solution Take 2.5 mg by nebulization every 6 (six) hours as needed for wheezing or shortness of breath.    . ALPRAZolam (XANAX) 0.5 MG tablet TAKE 1 TABLET BY MOUTH TWICE A DAY AS NEEDED FOR ANXIETY (Patient taking differently: Take 0.5 mg by mouth 2 (two) times daily as needed for anxiety. ) 60 tablet 1  . amLODipine (NORVASC) 2.5 MG tablet Take 2.5 mg by mouth daily.     [name] Kitchen aspirin EC 81 MG tablet Take 81 mg by mouth daily.    . benzonatate (TESSALON) 100 MG capsule Take 1 capsule (100 mg total) by mouth 3 (three) times daily. (Patient  taking differently: Take 100 mg by mouth 3 (three) times daily as needed for cough. ) 45 capsule 5  . buPROPion (WELLBUTRIN XL) 150 MG 24 hr tablet Take 150 mg by mouth daily.    [name] Kitchen dextromethorphan-guaiFENesin (MUCINEX DM) 30-600 MG per 12 hr tablet Take 2 tablets by mouth 2 (two) times daily as needed (cough and  congestion).     [name] Kitchen erythromycin ophthalmic ointment Place 1 application into both eyes at bedtime.     [name] Kitchen esomeprazole (NEXIUM) 40 MG capsule Take 40 mg by mouth daily before breakfast.   2  . famotidine (PEPCID) 20 MG tablet Take 20 mg by mouth at bedtime.    . fluticasone (FLONASE) 50 MCG/ACT nasal spray Place 2 sprays into both nostrils 2 (two) times daily.    . magnesium oxide (MAG-OX) 400 MG tablet Take 400 mg by mouth 2 (two) times daily. For leg cramps    . pravastatin (PRAVACHOL) 10 MG tablet Take 10 mg by mouth daily.    [name] Kitchen Respiratory Therapy Supplies (FLUTTER) DEVI Use as directed 1 each 0  . Respiratory Therapy Supplies (NEBULIZER COMPRESSOR) KIT Use as directed DX: J44.9 1 each 0  . sildenafil (VIAGRA) 100 MG tablet Take 100 mg by mouth daily as needed for erectile dysfunction.    . tamsulosin (FLOMAX) 0.4 MG CAPS capsule Take 0.4 mg by mouth daily with supper.    . traZODone (DESYREL) 50 MG tablet Take 1 tablet by mouth at bedtime as needed for sleep.     . TRELEGY ELLIPTA 100-62.5-25 MCG/INH AEPB TAKE 1 PUFF BY MOUTH EVERY DAY (Patient taking differently: Inhale 1 puff into the lungs daily. ) 180 each 1    Home: Home Living Family/patient expects to be discharged to:: Private residence Living Arrangements: Spouse/significant other  Functional History: Prior Function Level of Independence: Needs assistance Gait / Transfers Assistance Needed: Appears pt was independent with mobility. Pt unable to answer questions related to prior functional status Functional Status:  Mobility: Bed Mobility Overal bed mobility: Needs Assistance Bed Mobility: Supine to Sit Supine to sit: Mod assist General bed mobility comments: Assist to bring legs off of bed, elevate trunk into sitting and bring hips to EOB Transfers Overall transfer level: Needs assistance Equipment used: Rolling walker (2 wheeled) Transfers: Sit to/from Stand, [employer] Transfers Sit to Stand: Mod assist Stand pivot  transfers: Mod assist General transfer comment: Assist to bring hips up and for balance. Pt slow to rise. Pivotal steps bed to chair with walker with assist for balance and support. Assist to properly place rt hand on walker      ADL:    Cognition: Cognition Overall Cognitive Status: No family/caregiver present to determine baseline cognitive functioning Arousal/Alertness: Awake/alert Orientation Level: Oriented to person, Disoriented to place, Disoriented to time, Disoriented to situation Attention: Sustained Sustained Attention: Impaired Sustained Attention Impairment: Functional basic Problem Solving: Impaired Problem Solving Impairment: Functional basic Behaviors: Restless Safety/Judgment: Impaired Cognition Arousal/Alertness: Awake/alert Behavior During Therapy: Restless Overall Cognitive Status: No family/caregiver present to determine baseline cognitive functioning Area of Impairment: Following commands, Problem solving, Safety/judgement Following Commands: Follows one step commands inconsistently Safety/Judgement: Decreased awareness of safety, Decreased awareness of deficits Problem Solving: Slow processing, Decreased initiation, Difficulty sequencing, Requires verbal cues, Requires tactile cues General Comments: Pt not answering most questions so difficult to assess. Decr attention to rt Difficult to assess due to: Impaired communication  Blood pressure (!) 148/93, pulse (!) 101, temperature 98.3 F (36.8 C), temperature source Oral, resp.  rate (!) 22, height '6\' 3"'$  (1.905 m), weight 66.1 kg, SpO2 99 %. Physical Exam  Constitutional: No distress.  In wrist restraints. Appears lethargic  HENT:  Head: Normocephalic.  Eyes: Pupils are equal, round, and reactive to light.  Neck:  NGT  Cardiovascular:  tachy  Respiratory: Effort normal.  GI: Soft.  Neurological:  Opens eyes to verbal and tactile cues. Told [place]. Not much after that.  Speech low volume and dysarthric. Moved all 4 spontaneously and to command. Senses pain in all 4's  Skin: Skin is warm. He is not diaphoretic.  Psychiatric:  Flat, lethargic    Results for orders placed or performed during the hospital encounter of 08/31/18 (from the past 24 hour(s))  Glucose, capillary     Status: Abnormal   Collection Time: 09/13/18  7:37 AM  Result Value Ref Range   Glucose-Capillary 120 (H) 70 - 99 mg/dL  Glucose, capillary     Status: Abnormal   Collection Time: 09/13/18 11:18 AM  Result Value Ref Range   Glucose-Capillary 114 (H) 70 - 99 mg/dL  Glucose, capillary     Status: Abnormal   Collection Time: 09/13/18  3:24 PM  Result Value Ref Range   Glucose-Capillary 107 (H) 70 - 99 mg/dL  Glucose, capillary     Status: Abnormal   Collection Time: 09/13/18  7:51 PM  Result Value Ref Range   Glucose-Capillary 111 (H) 70 - 99 mg/dL  Glucose, capillary     Status: Abnormal   Collection Time: 09/14/18 12:16 AM  Result Value Ref Range   Glucose-Capillary 103 (H) 70 - 99 mg/dL   Comment 1 Notify RN   CBC     Status: Abnormal   Collection Time: 09/14/18  4:32 AM  Result Value Ref Range   WBC 15.4 (H) 4.0 - 10.5 K/uL   RBC 5.13 4.22 - 5.81 MIL/uL   Hemoglobin 15.6 13.0 - 17.0 g/dL   HCT 47.8 39.0 - 52.0 %   MCV 93.2 80.0 - 100.0 fL   MCH 30.4 26.0 - 34.0 pg   MCHC 32.6 30.0 - 36.0 g/dL   RDW 12.4 11.5 - 15.5 %   Platelets 379 150 - 400 K/uL   nRBC 0.0 0.0 - 0.2 %  Basic metabolic panel     Status: Abnormal   Collection Time: 09/14/18  4:32 AM  Result Value Ref Range   Sodium 141 135 - 145 mmol/L   Potassium 3.9 3.5 - 5.1 mmol/L   Chloride 104 98 - 111 mmol/L   CO2 27 22 - 32 mmol/L   Glucose, Bld 127 (H) 70 - 99 mg/dL   BUN 26 (H) 8 - 23 mg/dL   Creatinine, Ser 1.34 (H) 0.61 - 1.24 mg/dL   Calcium 9.9 8.9 - 10.3 mg/dL   GFR calc non Af Amer 53 (L) >60 mL/min   GFR calc Af Amer >60 >60 mL/min   Anion gap 10 5 - 15  Glucose, capillary     Status:  Abnormal   Collection Time: 09/14/18  4:35 AM  Result Value Ref Range   Glucose-Capillary 116 (H) 70 - 99 mg/dL   Comment 1 Notify RN    Dg Chest 1 View  Result Date: 09/14/2018 CLINICAL DATA:  73 year old male with recent respiratory failure, pneumonia. EXAM: CHEST  1 VIEW COMPARISON:  09/07/2018 and earlier. FINDINGS: Portable AP semi upright view at 0420 hours. Extubated. Enteric feeding tube remains in place. There is  a new right upper extremity approach PICC line with tip at the lower SVC level. Mediastinal contours are stable and within normal limits. Lung volumes are stable. Allowing for portable technique the lungs are clear. Incidental nipple shadows. Chronic left rib fractures again noted. IMPRESSION: 1. Extubated. Enteric feeding tube remains in place. Right upper extremity approach PICC line tip at the lower SVC level. 2. Stable lung volumes.  No acute cardiopulmonary abnormality. Electronically Signed   By: Genevie Ann M.D.   On: 09/14/2018 05:21     Assessment/Plan: Diagnosis: 73 yo male with hx of ?dementia now with debility, cognitive deficits related to left frontal hemorrhage and multiple medical issues 1. Does the need for close, 24 hr/day medical supervision in concert with the patient's rehab needs make it unreasonable for this patient to be served in a less intensive setting? Yes 2. Co-Morbidities requiring supervision/potential complications: dysphagia, COPD, CM, agitation/behavioral issues 3. Due to bladder management, bowel management, safety, skin/wound care, disease management, medication administration, pain management and patient education, does the patient require 24 hr/day rehab nursing? Potentially 4. Does the patient require coordinated care of a physician, rehab nurse, PT (1-2 hrs/day, 5 days/week), OT (1-2 hrs/day, 5 days/week) and SLP (1-2 hrs/day, 5 days/week) to address physical and functional deficits in the context of the above medical diagnosis(es)? Yes  Addressing deficits in the following areas: balance, endurance, locomotion, strength, transferring, bowel/bladder control, bathing, dressing, feeding, grooming, toileting, cognition, speech, swallowing and psychosocial support 5. Can the patient actively participate in an intensive therapy program of at least 3 hrs of therapy per day at least 5 days per week? Yes and Potentially 6. The potential for patient to make measurable gains while on inpatient rehab is good and fair 7. Anticipated functional outcomes upon discharge from inpatient rehab are min assist  with PT, min assist with OT, supervision and min assist with SLP. 8. Estimated rehab length of stay to reach the above functional goals is: 20-30 days 9. Anticipated D/C setting: Home 10. Anticipated post D/C treatments: [place] therapy 11. Overall Rehab/Functional Prognosis: good and fair  RECOMMENDATIONS: This patient's condition is appropriate for continued rehabilitative care in the following setting: see below Patient has agreed to participate in recommended program. N/A Note that insurance prior authorization may be required for reimbursement for recommended care.  Comment: Would like to see patient demonstrate more activity tolerance before we consider admission to inpatient rehab. Rehab Admissions Coordinator to follow up.  Thanks,  Meredith Staggers, MD, Mellody Drown  I have personally performed a face to face diagnostic evaluation of this patient. Additionally, I have examined pertinent labs and radiographic images. I have reviewed and concur with the physician assistant's documentation above.    Lavon Paganini Angiulli, PA-C 09/14/2018

## 2018-09-15 ENCOUNTER — Inpatient Hospital Stay (HOSPITAL_COMMUNITY): Payer: Medicare Other

## 2018-09-15 DIAGNOSIS — I429 Cardiomyopathy, unspecified: Secondary | ICD-10-CM

## 2018-09-15 LAB — CBC
HCT: 43.5 % (ref 39.0–52.0)
Hemoglobin: 14.4 g/dL (ref 13.0–17.0)
MCH: 30.2 pg (ref 26.0–34.0)
MCHC: 33.1 g/dL (ref 30.0–36.0)
MCV: 91.2 fL (ref 80.0–100.0)
Platelets: 352 10*3/uL (ref 150–400)
RBC: 4.77 MIL/uL (ref 4.22–5.81)
RDW: 12.3 % (ref 11.5–15.5)
WBC: 11.8 10*3/uL — ABNORMAL HIGH (ref 4.0–10.5)
nRBC: 0 % (ref 0.0–0.2)

## 2018-09-15 LAB — GLUCOSE, CAPILLARY
Glucose-Capillary: 104 mg/dL — ABNORMAL HIGH (ref 70–99)
Glucose-Capillary: 109 mg/dL — ABNORMAL HIGH (ref 70–99)
Glucose-Capillary: 115 mg/dL — ABNORMAL HIGH (ref 70–99)
Glucose-Capillary: 128 mg/dL — ABNORMAL HIGH (ref 70–99)
Glucose-Capillary: 131 mg/dL — ABNORMAL HIGH (ref 70–99)
Glucose-Capillary: 96 mg/dL (ref 70–99)

## 2018-09-15 LAB — BASIC METABOLIC PANEL
Anion gap: 7 (ref 5–15)
BUN: 23 mg/dL (ref 8–23)
CO2: 29 mmol/L (ref 22–32)
Calcium: 9.6 mg/dL (ref 8.9–10.3)
Chloride: 104 mmol/L (ref 98–111)
Creatinine, Ser: 1.35 mg/dL — ABNORMAL HIGH (ref 0.61–1.24)
GFR calc Af Amer: >60 mL/min (ref >60)
GFR calc non Af Amer: 53 mL/min — ABNORMAL LOW (ref >60)
Glucose, Bld: 134 mg/dL — ABNORMAL HIGH (ref 70–99)
Potassium: 4 mmol/L (ref 3.5–5.1)
Sodium: 140 mmol/L (ref 135–145)

## 2018-09-15 LAB — ECHOCARDIOGRAM LIMITED
Height: 75 in
Weight: 2507.95 oz

## 2018-09-15 LAB — MAGNESIUM: Magnesium: 2.1 mg/dL (ref 1.7–2.4)

## 2018-09-15 MED ORDER — SODIUM BICARBONATE 650 MG PO TABS
650.0000 mg | ORAL_TABLET | Freq: Once | ORAL | Status: AC
  Administered 2018-09-15: 650 mg
  Filled 2018-09-15: qty 1

## 2018-09-15 MED ORDER — PANCRELIPASE (LIP-PROT-AMYL) 10440-39150 UNITS PO TABS
20880.0000 [IU] | ORAL_TABLET | Freq: Once | ORAL | Status: AC
  Administered 2018-09-15: 20880 [IU]
  Filled 2018-09-15: qty 2

## 2018-09-15 NOTE — Progress Notes (Signed)
  Speech Language Pathology Treatment: Cognitive-Linquistic  Patient Details Name: Jeremiah [name] MRN: [id number] DOB: 22-Jun-1945 Today's Date: 09/15/2018 Time: 7412-8786 SLP Time Calculation (min) (ACUTE ONLY): 12 min  Assessment / Plan / Recommendation Clinical Impression  Pt was seen for cognitive-linguistic treatment session. He was alert during the sesion but exhibited difficulty attending to tasks and required encouragement to participate in all tasks. He demonstrated 25% accuracy with simple reasoning when moderate cues were provided. He was oriented to self but not place of time despite max cues. The session was terminated due to pt's progressive reduction in participation and his stating, "You can go about your business now". SLP will continue to follow.    HPI HPI: 73 year old male admitted 08/31/2018 to [facility] with SOB/ AECOPD, required intubation. Found to have small LLL PE and 31mm left frontal lobe hemorrhage. Transferred to [facility]. PMH: Gold III COPD, OSA, HTN, PTSD, depression.      SLP Plan  Continue with current plan of care       Recommendations  Diet recommendations: NPO Medication Administration: Via alternative means                Oral Care Recommendations: Oral care QID Follow up Recommendations: Inpatient Rehab SLP Visit Diagnosis: Cognitive communication deficit (V67.209) Plan: Continue with current plan of care       Jeremiah [name] I. Hardin Negus, [place], [place] Office number [phone] Pager [phone]               Jeremiah [name] 09/15/2018, 4:23 PM

## 2018-09-15 NOTE — Progress Notes (Signed)
  Echocardiogram 2D Echocardiogram limited has been performed.  Darlina Sicilian M 09/15/2018, 12:44 PM

## 2018-09-15 NOTE — Progress Notes (Signed)
Cortrak clogged to Rt Nare. Unable to flush tube. Will update MD. Patient is not able to receive nutrition  or medications.

## 2018-09-15 NOTE — Progress Notes (Signed)
Received from 2M09 via bed. Assessment as charted.

## 2018-09-15 NOTE — Progress Notes (Signed)
SLP Cancellation Note  Patient Details Name: Rykar Lebleu MRN: [id number] DOB: Jan 12, 1946   Cancelled treatment:       Reason Eval/Treat Not Completed: Medical issues which prohibited therapy(Pt receiving care from RN at this time. SLP will re-attempt as able.)  Luqman Perrelli I. Hardin Negus, [place], [place] Office number [phone] Pager [place] 09/15/2018, 11:01 AM

## 2018-09-15 NOTE — Progress Notes (Signed)
PROGRESS NOTE    Jeremiah [name]  FUX:[id number] DOB: [birth year] DOA: 08/31/2018 PCP: Selena Lesser, MD   Brief Narrative:  73 year old with past medical history of COPD, obstructive sleep apnea, essential hypertension, PTSD, depression initially presented to any pain hospital on 5/18 with shortness of breath.  Failed NIPPV therefore had to be intubated.  Had significant amount of bronchospasm requiring intubation.  CTA chest showed incidental finding of small left lower lobe PE, CT of the head showed small 5 mm left frontal lobe hemorrhage and MRI confirmed this consistent with hypertension or amyloid angiopathy therefore heparin drip was deferred.  Abnormal EKG showing J-point elevation and cardiology recommended trending her troponin but no further intervention at this point.  Eventually extubated on 5/27.  Lower extremity Dopplers negative for DVT, echocardiogram showed ejection fraction 20-25% suspect stress-induced cardiomyopathy.  Due to agitation he was placed on Precedex and transition to Seroquel with Haldol as needed.  Completed course of steroid and avoiding any further use avoid worsening of delirium.   Assessment & Plan:   Active Problems:   COPD, frequent exacerbations ([place])   Intracerebral hemorrhage   Pulmonary embolism (HCC)   Acute combined systolic and diastolic heart failure (HCC)   Acute renal failure with acute cortical necrosis (HCC)   Delirium due to another medical condition, acute, hyperactive   Dementia ([place])  Acute delirium Progressive dementia -Mostly hypoactive.  Patient is calm and following commands.  Answering basic questions.  Off Precedex drip.  Currently on Seroquel, continue to monitor.  Haldol as needed. -Patient does have ongoing progressive dementia per family members.  Acute on chronic COPD exacerbation, slowly improving -Continue bronchodilators.  Completed course of IV steroids.  Continue to monitor.  Acute systolic congestive heart failure with  ejection fraction 25% -Clinically appears to be euvolemic.  At this time suspicion is this is all stress-induced.  Will repeat another limited echocardiogram.  Will need repeat echocardiogram also in about 6 weeks, question of possible ischemic work-up in the future.  This decision can be deferred in the future depending on how patient is doing.  Acute kidney injury secondary to cardiomyopathy -This is improved with improvement in blood pressure.  Dysphagia -Speech and swallow team following.  Currently remains n.p.o., awaiting their clearance.  Tube feeding in place  Small left lower lobe pulmonary embolism -No obvious signs of hemodynamic instability.  Not on anticoagulation due to intracranial hemorrhage  Small left frontal lobe intracranial hemorrhage, due to hypertension versus amyloid angiopathy -No anticoagulation.  Continue to monitor.  No midline shift noted.  Please maintain soft restraints in place due to patient's occasional impulsive behavior with tendency pulling his IV line, getting out of bed, pulling on his feeding tube.  DVT prophylaxis: SCDs Code Status: Full code for family Family Communication: Spoke with the patient's brother-in-law, Dr. Lysle Morales Disposition Plan: Maintain hosp stay until above medical conditions stable. Eventually dispo planning- family has it on hold to see how he does clinically first.   Consultants:   Cardiology   critical care  Procedures:   Intubated 5/18, extubated 5/27  Antimicrobials:   Azithromycin 1 day 5/18  Rocephin 5/18-5/22  Cefepime 5/22-5/25  Vancomycin 5/22- 5/25   Subjective: Patient is laying in the bed only alert to name and tells me he is in [place] otherwise denying any complaints at this time.  Review of Systems Otherwise negative except as per HPI, including: General: Denies fever, chills, night sweats or unintended weight loss. Resp: Denies cough, wheezing,  shortness of breath. Cardiac:  Denies chest pain, palpitations, orthopnea, paroxysmal nocturnal dyspnea. GI: Denies abdominal pain, nausea, vomiting, diarrhea or constipation GU: Denies dysuria, frequency, hesitancy or incontinence MS: Denies muscle aches, joint pain or swelling Neuro: Denies headache, neurologic deficits (focal weakness, numbness, tingling), abnormal gait Psych: Denies anxiety, depression, SI/HI/AVH Skin: Denies new rashes or lesions ID: Denies sick contacts, exotic exposures, travel  Objective: Vitals:   09/14/18 2107 09/15/18 0448 09/15/18 0500 09/15/18 0750  BP: 132/83 118/79    Pulse: 93 95    Resp: 20 18    Temp: 97.8 F (36.6 C) 98.4 F (36.9 C)    TempSrc: Oral Oral    SpO2: 95% 97%  93%  Weight: 71.1 kg  71.1 kg   Height:        Intake/Output Summary (Last 24 hours) at 09/15/2018 0802 Last data filed at 09/15/2018 0448 Gross per 24 hour  Intake 1229.68 ml  Output 725 ml  Net 504.68 ml   Filed Weights   09/14/18 0456 09/14/18 2107 09/15/18 0500  Weight: 66.1 kg 71.1 kg 71.1 kg    Examination:  General exam: Chronically ill-appearing, feeding tube currently is in place soft restraints in place Respiratory system: Overall clear to auscultation bilaterally Cardiovascular system: S1 & S2 heard, RRR. No JVD, murmurs, rubs, gallops or clicks. No pedal edema. Gastrointestinal system: Abdomen is nondistended, soft and nontender. No organomegaly or masses felt. Normal bowel sounds heard. Central nervous system: Awake but unable to carry on a full conversation.  Protecting his airway.  No focal neuro deficits.  Does have occasional impulsive behavior trying to get up and take his IV line and feeding tube out Extremities: Symmetric 5 x 5 power. Skin: No rashes, lesions or ulcers Psychiatry: Overall poor judgment and insight    Data Reviewed:   CBC: Recent Labs  Lab 09/08/18 0915 09/09/18 0333 09/10/18 0500 09/11/18 0349 09/11/18 0651 09/13/18 0419 09/14/18 0432  WBC 12.6*  11.5* 13.2* 15.8*  --  17.5* 15.4*  NEUTROABS 8.7*  --   --   --   --   --   --   HGB 14.2 13.6 14.7 14.9 13.9 15.0 15.6  HCT 43.7 42.4 44.9 46.2 41.0 45.6 47.8  MCV 93.8 95.3 94.5 94.3  --  92.1 93.2  PLT 193 194 269 307  --  355 010   Basic Metabolic Panel: Recent Labs  Lab 09/10/18 0500 09/11/18 0349 09/11/18 0651 09/12/18 0430 09/13/18 0419 09/14/18 0432  NA 152* 150* 150* 146* 144 141  K 3.3* 3.4* 3.6 3.4* 3.9 3.9  CL 110 112*  --  107 106 104  CO2 33* 28  --  29 26 27   GLUCOSE 113* 131*  --  152* 128* 127*  BUN 66* 55*  --  43* 32* 26*  CREATININE 1.42* 1.43*  --  1.53* 1.50* 1.34*  CALCIUM 9.7 9.9  --  10.3 10.0 9.9  MG 2.7* 2.2  --  2.1 2.1  --   PHOS 3.0  --   --   --   --   --    GFR: Estimated Creatinine Clearance: 51.6 mL/min (A) (by C-G formula based on SCr of 1.34 mg/dL (H)). Liver Function Tests: No results for input(s): AST, ALT, ALKPHOS, BILITOT, PROT, ALBUMIN in the last 168 hours. No results for input(s): LIPASE, AMYLASE in the last 168 hours. No results for input(s): AMMONIA in the last 168 hours. Coagulation Profile: No results for input(s): INR, PROTIME in the  last 168 hours. Cardiac Enzymes: No results for input(s): CKTOTAL, CKMB, CKMBINDEX, TROPONINI in the last 168 hours. BNP (last 3 results) No results for input(s): PROBNP in the last 8760 hours. HbA1C: No results for input(s): HGBA1C in the last 72 hours. CBG: Recent Labs  Lab 09/14/18 1923 09/14/18 2202 09/15/18 0014 09/15/18 0445 09/15/18 0727  GLUCAP 119* 98 109* 128* 115*   Lipid Profile: No results for input(s): CHOL, HDL, LDLCALC, TRIG, CHOLHDL, LDLDIRECT in the last 72 hours. Thyroid Function Tests: No results for input(s): TSH, T4TOTAL, FREET4, T3FREE, THYROIDAB in the last 72 hours. Anemia Panel: No results for input(s): VITAMINB12, FOLATE, FERRITIN, TIBC, IRON, RETICCTPCT in the last 72 hours. Sepsis Labs: No results for input(s): PROCALCITON, LATICACIDVEN in the last 168  hours.  No results found for this or any previous visit (from the past 240 hour(s)).       Radiology Studies: Dg Chest 1 View  Result Date: 09/14/2018 CLINICAL DATA:  73 year old male with recent respiratory failure, pneumonia. EXAM: CHEST  1 VIEW COMPARISON:  09/07/2018 and earlier. FINDINGS: Portable AP semi upright view at 0420 hours. Extubated. Enteric feeding tube remains in place. There is a new right upper extremity approach PICC line with tip at the lower SVC level. Mediastinal contours are stable and within normal limits. Lung volumes are stable. Allowing for portable technique the lungs are clear. Incidental nipple shadows. Chronic left rib fractures again noted. IMPRESSION: 1. Extubated. Enteric feeding tube remains in place. Right upper extremity approach PICC line tip at the lower SVC level. 2. Stable lung volumes.  No acute cardiopulmonary abnormality. Electronically Signed   By: Genevie Ann M.D.   On: 09/14/2018 05:21        Scheduled Meds: . arformoterol  15 mcg Nebulization BID  . aspirin  81 mg Per Tube Daily  . budesonide (PULMICORT) nebulizer solution  0.5 mg Nebulization BID  . Chlorhexidine Gluconate Cloth  6 each Topical Daily  . famotidine  20 mg Per Tube QHS  . free water  300 mL Per Tube Q6H  . furosemide  20 mg Per Tube Daily  . insulin aspart  0-5 Units Subcutaneous Q4H  . ipratropium  0.5 mg Nebulization BID  . isosorbide-hydrALAZINE  1 tablet Per Tube TID  . mouth rinse  15 mL Mouth Rinse q12n4p  . pantoprazole sodium  40 mg Per Tube Daily  . pravastatin  10 mg Per Tube q1800  . QUEtiapine  50 mg Per Tube BID  . sodium chloride flush  10-40 mL Intracatheter Q12H   Continuous Infusions: . sodium chloride 10 mL/hr at 09/14/18 1800  . feeding supplement (JEVITY 1.2 CAL) 75 mL/hr at 09/14/18 2000     LOS: 15 days   Time spent= 40 mins     Arsenio Loader, MD [facility]  If 7PM-7AM, please contact night-coverage [email or web address] 09/15/2018,  8:02 AM

## 2018-09-15 NOTE — Care Management Important Message (Signed)
Important Message  Patient Details  Name: Jeremiah [name] MRN: [id number] Date of Birth: June 30, 1945   Medicare Important Message Given:  Yes  Due to illness patient is not able to sign. Unsigned copy left at the patient bedside.   Manpreet Kemmer 09/15/2018, 12:08 PM

## 2018-09-16 ENCOUNTER — Inpatient Hospital Stay (HOSPITAL_COMMUNITY): Payer: Medicare Other

## 2018-09-16 LAB — GLUCOSE, CAPILLARY
Glucose-Capillary: 128 mg/dL — ABNORMAL HIGH (ref 70–99)
Glucose-Capillary: 87 mg/dL (ref 70–99)
Glucose-Capillary: 87 mg/dL (ref 70–99)
Glucose-Capillary: 90 mg/dL (ref 70–99)
Glucose-Capillary: 95 mg/dL (ref 70–99)
Glucose-Capillary: 96 mg/dL (ref 70–99)

## 2018-09-16 LAB — CBC
HCT: 46.1 % (ref 39.0–52.0)
Hemoglobin: 15.3 g/dL (ref 13.0–17.0)
MCH: 30.8 pg (ref 26.0–34.0)
MCHC: 33.2 g/dL (ref 30.0–36.0)
MCV: 92.9 fL (ref 80.0–100.0)
Platelets: 410 10*3/uL — ABNORMAL HIGH (ref 150–400)
RBC: 4.96 MIL/uL (ref 4.22–5.81)
RDW: 12.4 % (ref 11.5–15.5)
WBC: 11.4 10*3/uL — ABNORMAL HIGH (ref 4.0–10.5)
nRBC: 0 % (ref 0.0–0.2)

## 2018-09-16 LAB — BASIC METABOLIC PANEL
Anion gap: 13 (ref 5–15)
BUN: 20 mg/dL (ref 8–23)
CO2: 25 mmol/L (ref 22–32)
Calcium: 9.8 mg/dL (ref 8.9–10.3)
Chloride: 102 mmol/L (ref 98–111)
Creatinine, Ser: 1.43 mg/dL — ABNORMAL HIGH (ref 0.61–1.24)
GFR calc Af Amer: 57 mL/min — ABNORMAL LOW (ref >60)
GFR calc non Af Amer: 49 mL/min — ABNORMAL LOW (ref >60)
Glucose, Bld: 102 mg/dL — ABNORMAL HIGH (ref 70–99)
Potassium: 4 mmol/L (ref 3.5–5.1)
Sodium: 140 mmol/L (ref 135–145)

## 2018-09-16 LAB — MAGNESIUM: Magnesium: 2.3 mg/dL (ref 1.7–2.4)

## 2018-09-16 MED ORDER — DEXTROSE-NACL 5-0.45 % IV SOLN
INTRAVENOUS | Status: DC
  Administered 2018-09-16 – 2018-09-21 (×5): via INTRAVENOUS

## 2018-09-16 MED ORDER — SODIUM BICARBONATE 650 MG PO TABS
650.0000 mg | ORAL_TABLET | Freq: Once | ORAL | Status: AC
  Administered 2018-09-16: 650 mg
  Filled 2018-09-16: qty 1

## 2018-09-16 MED ORDER — PANCRELIPASE (LIP-PROT-AMYL) 10440-39150 UNITS PO TABS
20880.0000 [IU] | ORAL_TABLET | Freq: Once | ORAL | Status: AC
  Administered 2018-09-16: 20880 [IU]
  Filled 2018-09-16: qty 2

## 2018-09-16 NOTE — Progress Notes (Signed)
PROGRESS NOTE    Jeremiah [name]  IOX:[id number] DOB: 21-Oct-1945 DOA: 08/31/2018 PCP: Selena Lesser, MD   Brief Narrative:  73 year old with past medical history of COPD, obstructive sleep apnea, essential hypertension, PTSD, depression initially presented to any pain hospital on 5/18 with shortness of breath.  Failed NIPPV therefore had to be intubated.  Had significant amount of bronchospasm requiring intubation.  CTA chest showed incidental finding of small left lower lobe PE, CT of the head showed small 5 mm left frontal lobe hemorrhage and MRI confirmed this consistent with hypertension or amyloid angiopathy therefore heparin drip was deferred.  Abnormal EKG showing J-point elevation and cardiology recommended trending her troponin but no further intervention at this point.  Eventually extubated on 5/27.  Lower extremity Dopplers negative for DVT, echocardiogram showed ejection fraction 20-25% suspect stress-induced cardiomyopathy.  Due to agitation he was placed on Precedex and transition to Seroquel with Haldol as needed.  Completed course of steroid and avoiding any further use avoid worsening of delirium.   Assessment & Plan:   Active Problems:   COPD, frequent exacerbations ([place])   Intracerebral hemorrhage   Pulmonary embolism (HCC)   Acute combined systolic and diastolic heart failure (HCC)   Acute renal failure with acute cortical necrosis (HCC)   Delirium due to another medical condition, acute, hyperactive   Dementia ([place])  Acute delirium Progressive dementia -Mostly remains hypoactive.  Patient is calm and following commands.  Answering basic questions.  Off Precedex drip.  Currently on Seroquel, continue to monitor.  Haldol as needed. -Patient does have ongoing progressive dementia per family members.  Acute on chronic COPD exacerbation, slowly improving -Continue bronchodilators.  Completed course of IV steroids.  Continue to monitor.  Acute systolic congestive heart  failure ejection fraction 25%- stress related, resolved Chronic diastolic congestive heart failure with preserved ejection fraction, 65%.  Appears euvolemic. -This was likely stress related.  Limited echocardiogram 6/2 shows recovery of ejection fraction to 60% with grade 1 diastolic dysfunction  Acute kidney injury secondary to cardiomyopathy -This is improved with improvement in blood pressure.  Dysphagia -Failed speech and swallow.  Core track is currently clogged, will have temporary NG tube placed.  In the meantime will need MBS -In the meantime continue Accu-Chek, D5 half-normal saline  Small left lower lobe pulmonary embolism -No obvious signs of hemodynamic instability.  Not on anticoagulation due to intracranial hemorrhage  Small left frontal lobe intracranial hemorrhage, due to hypertension versus amyloid angiopathy -No anticoagulation.  Continue to monitor.  No midline shift noted.  Please maintain soft restraints in place due to patient's occasional impulsive behavior with tendency pulling his IV line, getting out of bed, pulling on his feeding tube.  DVT prophylaxis: SCDs Code Status: Full code for family Family Communication: Spoke with Dr. Lysle Morales, patient's brother-in-law. Disposition Plan: Maintain hospital stay to ensure his feeding status has been addressed.  Eventually he is going to need inpatient rehabilitation.  Inpatient rehabilitation staff is currently awaiting IV progresses with inpatient therapy to make final determination  Consultants:   Cardiology   critical care  Procedures:   Intubated 5/18, extubated 5/27  Antimicrobials:   Azithromycin 1 day 5/18  Rocephin 5/18-5/22  Cefepime 5/22-5/25  Vancomycin 5/22- 5/25   Subjective: Patient is laying in the bed he is alert to his name.  But denies any complaints  Review of Systems Otherwise negative except as per HPI, including: General: Denies fever, chills, night sweats or unintended  weight loss. Resp: Denies cough,  wheezing, shortness of breath. Cardiac: Denies chest pain, palpitations, orthopnea, paroxysmal nocturnal dyspnea. GI: Denies abdominal pain, nausea, vomiting, diarrhea or constipation GU: Denies dysuria, frequency, hesitancy or incontinence MS: Denies muscle aches, joint pain or swelling Neuro: Denies headache, neurologic deficits (focal weakness, numbness, tingling), abnormal gait Psych: Denies anxiety, depression, SI/HI/AVH Skin: Denies new rashes or lesions ID: Denies sick contacts, exotic exposures, travel  Objective: Vitals:   09/15/18 2005 09/16/18 0433 09/16/18 0756 09/16/18 0921  BP: 121/82 (!) 129/91  128/80  Pulse: (!) 108 (!) 101  89  Resp: 18 18  18   Temp: 98.6 F (37 C) 98.4 F (36.9 C)  98.1 F (36.7 C)  TempSrc: Oral Oral  Oral  SpO2: 97% 97% 92% 95%  Weight: 70 kg     Height:        Intake/Output Summary (Last 24 hours) at 09/16/2018 1225 Last data filed at 09/16/2018 0600 Gross per 24 hour  Intake 730 ml  Output 600 ml  Net 130 ml   Filed Weights   09/14/18 2107 09/15/18 0500 09/15/18 2005  Weight: 71.1 kg 71.1 kg 70 kg    Examination: Constitutional: Not in any acute distress, alert to his name.  Chronically ill-appearing.  Soft restraints are in place. Eyes: PERRL, lids and conjunctivae normal ENMT: Mucous membranes are moist. Posterior pharynx clear of any exudate or lesions.Normal dentition.  Neck: normal, supple, no masses, no thyromegaly Respiratory: clear to auscultation bilaterally, no wheezing, no crackles. Normal respiratory effort. No accessory muscle use.  Cardiovascular: Regular rate and rhythm, no murmurs / rubs / gallops. No extremity edema. 2+ pedal pulses. No carotid bruits.  Abdomen: no tenderness, no masses palpated. No hepatosplenomegaly. Bowel sounds positive.  Musculoskeletal: No gross deformities Skin: no rashes, lesions, ulcers. No induration Neurologic: CN 2-12 grossly intact. Sensation intact, DTR  normal.  Unable to stand up on his feet really. Psychiatric: Poor judgment and insight.  Only alert to his name   Data Reviewed:   CBC: Recent Labs  Lab 09/11/18 0349 09/11/18 0651 09/13/18 0419 09/14/18 0432 09/15/18 1148 09/16/18 0456  WBC 15.8*  --  17.5* 15.4* 11.8* 11.4*  HGB 14.9 13.9 15.0 15.6 14.4 15.3  HCT 46.2 41.0 45.6 47.8 43.5 46.1  MCV 94.3  --  92.1 93.2 91.2 92.9  PLT 307  --  355 379 352 622*   Basic Metabolic Panel: Recent Labs  Lab 09/10/18 0500 09/11/18 0349  09/12/18 0430 09/13/18 0419 09/14/18 0432 09/15/18 1148 09/16/18 0456  NA 152* 150*   < > 146* 144 141 140 140  K 3.3* 3.4*   < > 3.4* 3.9 3.9 4.0 4.0  CL 110 112*  --  107 106 104 104 102  CO2 33* 28  --  29 26 27 29 25   GLUCOSE 113* 131*  --  152* 128* 127* 134* 102*  BUN 66* 55*  --  43* 32* 26* 23 20  CREATININE 1.42* 1.43*  --  1.53* 1.50* 1.34* 1.35* 1.43*  CALCIUM 9.7 9.9  --  10.3 10.0 9.9 9.6 9.8  MG 2.7* 2.2  --  2.1 2.1  --  2.1 2.3  PHOS 3.0  --   --   --   --   --   --   --    < > = values in this interval not displayed.   GFR: Estimated Creatinine Clearance: 47.6 mL/min (A) (by C-G formula based on SCr of 1.43 mg/dL (H)). Liver Function Tests: No results for  input(s): AST, ALT, ALKPHOS, BILITOT, PROT, ALBUMIN in the last 168 hours. No results for input(s): LIPASE, AMYLASE in the last 168 hours. No results for input(s): AMMONIA in the last 168 hours. Coagulation Profile: No results for input(s): INR, PROTIME in the last 168 hours. Cardiac Enzymes: No results for input(s): CKTOTAL, CKMB, CKMBINDEX, TROPONINI in the last 168 hours. BNP (last 3 results) No results for input(s): PROBNP in the last 8760 hours. HbA1C: No results for input(s): HGBA1C in the last 72 hours. CBG: Recent Labs  Lab 09/15/18 2002 09/16/18 0032 09/16/18 0431 09/16/18 0821 09/16/18 1132  GLUCAP 104* 87 87 96 90   Lipid Profile: No results for input(s): CHOL, HDL, LDLCALC, TRIG, CHOLHDL,  LDLDIRECT in the last 72 hours. Thyroid Function Tests: No results for input(s): TSH, T4TOTAL, FREET4, T3FREE, THYROIDAB in the last 72 hours. Anemia Panel: No results for input(s): VITAMINB12, FOLATE, FERRITIN, TIBC, IRON, RETICCTPCT in the last 72 hours. Sepsis Labs: No results for input(s): PROCALCITON, LATICACIDVEN in the last 168 hours.  No results found for this or any previous visit (from the past 240 hour(s)).       Radiology Studies: No results found.      Scheduled Meds: . arformoterol  15 mcg Nebulization BID  . aspirin  81 mg Per Tube Daily  . budesonide (PULMICORT) nebulizer solution  0.5 mg Nebulization BID  . Chlorhexidine Gluconate Cloth  6 each Topical Daily  . famotidine  20 mg Per Tube QHS  . free water  300 mL Per Tube Q6H  . furosemide  20 mg Per Tube Daily  . insulin aspart  0-5 Units Subcutaneous Q4H  . ipratropium  0.5 mg Nebulization BID  . isosorbide-hydrALAZINE  1 tablet Per Tube TID  . mouth rinse  15 mL Mouth Rinse q12n4p  . pantoprazole sodium  40 mg Per Tube Daily  . pravastatin  10 mg Per Tube q1800  . QUEtiapine  50 mg Per Tube BID  . sodium chloride flush  10-40 mL Intracatheter Q12H   Continuous Infusions: . sodium chloride 10 mL/hr at 09/14/18 2300  . feeding supplement (JEVITY 1.2 CAL) 1,000 mL (09/15/18 0852)     LOS: 16 days   Time spent= 35 mins    Ankit Arsenio Loader, MD [facility]  If 7PM-7AM, please contact night-coverage [email or web address] 09/16/2018, 12:25 PM

## 2018-09-16 NOTE — Progress Notes (Signed)
Unable to unclogged Cortrak twice. NP masde aware.

## 2018-09-16 NOTE — Progress Notes (Signed)
Nutrition Follow-up  DOCUMENTATION CODES:   Underweight  INTERVENTION:   Pt to have MBS this afternoon. Will monitor for results and [place] updates.   Will provide supplements if pt passes swallow.   If tube feeding desired recommend replace of Cortrak with Jevity 1.2 @ 75 ml/hr (1800 ml). Provides 2160 kcal, 100 g protein, and 1458 ml free water. Meets 100% of needs.   NUTRITION DIAGNOSIS:   Inadequate oral intake related to inability to eat as evidenced by NPO status.  Ongoing  GOAL:   Patient will meet greater than or equal to 90% of their needs  Not met   MONITOR:   TF tolerance, Labs, Skin, Diet advancement  REASON FOR ASSESSMENT:   Ventilator, Consult Enteral/tube feeding initiation and management  ASSESSMENT:   73 year old male who presented to the ED on 5/18 with SOB. PMH of COPD, HTN, PTSD. Pt found to have Takotsubo cardiomyopathy/acute myocarditis, small LLL PE, and small left front lobe hemorrhage. Pt intubated in the ED. Pt was extubated to nasal cannula on 5/19. Pt reintubated later in the day on 5/19 for agitation and possible stridor.   5/20- Cortrak placed  5/27- extubated   RD working remotely.  Spoke with RN via phone. Reports pt's tube clogged this am and was unable to be unclogged with protocol. Discussed tube replacement options with MD as Cortrak service is not available until Friday. Pt has MBS scheduled for today. Ment[place] has been a barrier this admission. Plan to monitor for results and if pt is unable to pass MD to have discussion regarding comfort feedings and long term feeding tube. See recommendations above.   Weight noted to increase from 66.5 kg on 5/28 to 70 kg today.   I/O: +4,388 ml since 5/20 UOP: 1,100 ml x 24 hrs   Medications: 20 mg lasix once daily Labs: CBG 87-104  Diet Order:   Diet Order    None      EDUCATION NEEDS:   No education needs have been identified at this time  Skin:  Skin Assessment: Reviewed RN  Assessment  Last BM:  5/20 (type 7)  Height:   Ht Readings from Last 1 Encounters:  08/31/18 '6\' 3"'$  (1.905 m)    Weight:   Wt Readings from Last 1 Encounters:  09/15/18 70 kg    Ideal Body Weight:  89.1 kg  BMI:  Body mass index is 19.29 kg/m.  Estimated Nutritional Needs:   Kcal:  2100-2300  Protein:  100-115 grams  Fluid:  >/= 1.8 L   Mariana Single RD, LDN Clinical Nutrition Pager # [phone]

## 2018-09-16 NOTE — Progress Notes (Signed)
Physical Therapy Treatment Patient Details Name: Seraphim Trow MRN: [id number] DOB: [birth year] Today's Date: 09/16/2018    History of Present Illness Pt adm to [facility] with respiratory failure due to copd exacerbation. Pt also found to have small LLL PE and small 39mm left frontal lobe hemorrhage. Transferred to [name]. Pt intubated 5/18-5/27. Pt also with confusion. PMH - COPD, OSA, HTN, PTSD, Depression    PT Comments    Patient with some progress today with therapy, improved bed mobility and tolerating standing EOB with +1 assistance, mod A. Still limited in great part by cognition following cues slightly less than 50% of the time. Multi modal cuing required throughout visit. It pt continues to progress, will update recs CIR.     Follow Up Recommendations  SNF;Supervision/Assistance - 24 hour (with more progress, CIR)      Equipment Recommendations  Other (comment)(TBD)    Recommendations for Other Services       Precautions / Restrictions Precautions Precautions: Fall Precaution Comments: pushes to the right (mild) Restrictions Weight Bearing Restrictions: No    Mobility  Bed Mobility Overal bed mobility: Needs Assistance Bed Mobility: Supine to Sit;Sit to Supine     Supine to sit: Mod assist Sit to supine: Mod assist   General bed mobility comments: mod A to come to ositting EOB  Transfers Overall transfer level: Needs assistance Equipment used: Rolling walker (2 wheeled) Transfers: Sit to/from Stand Sit to Stand: Mod assist;From elevated surface         General transfer comment: cues for hand placement, standing EOB with mod A, min A for static stabilty.   Ambulation/Gait                 Stairs             Wheelchair Mobility    Modified Rankin (Stroke Patients Only)       Balance Overall balance assessment: Needs assistance Sitting-balance support: Bilateral upper extremity supported;Feet supported Sitting balance-Leahy Scale:  Poor Sitting balance - Comments: Sat EOB x 15 minutes with mod to min guard assist. cues for Pt to go down on Left elbow to combat R lateral lean/push Postural control: Other (comment)(anterior lean) Standing balance support: Bilateral upper extremity supported Standing balance-Leahy Scale: Poor Standing balance comment: walker and mod A +2 to min A +2 assist for static standing. Stood x 1 minute with incr hip and trunk extension as time increased; increased fatigue from last session                            Cognition Arousal/Alertness: Awake/alert Behavior During Therapy: WFL for tasks assessed/performed Overall Cognitive Status: No family/caregiver present to determine baseline cognitive functioning Area of Impairment: Following commands;Problem solving;Safety/judgement;Orientation;Awareness;Attention                 Orientation Level: Place;Time Current Attention Level: Focused;Sustained(varied between levels)   Following Commands: Follows one step commands inconsistently;Follows one step commands with increased time Safety/Judgement: Decreased awareness of safety;Decreased awareness of deficits Awareness: Intellectual Problem Solving: Slow processing;Decreased initiation;Difficulty sequencing;Requires verbal cues;Requires tactile cues General Comments: would annswer or folllow cues 40-50% of time.       Exercises      General Comments        Pertinent Vitals/Pain Faces Pain Scale: No hurt    Home Living                      Prior Function  PT Goals (current goals can now be found in the care plan section) Acute Rehab PT Goals PT Goal Formulation: Patient unable to participate in goal setting Time For Goal Achievement: 09/25/18 Potential to Achieve Goals: Fair Progress towards PT goals: Progressing toward goals    Frequency    Min 3X/week      PT Plan Discharge plan needs to be updated    Co-evaluation PT/OT/SLP  Co-Evaluation/Treatment: Yes            AM-PAC PT "6 Clicks" Mobility   Outcome Measure  Help needed turning from your back to your side while in a flat bed without using bedrails?: A Lot Help needed moving from lying on your back to sitting on the side of a flat bed without using bedrails?: A Lot Help needed moving to and from a bed to a chair (including a wheelchair)?: A Lot Help needed standing up from a chair using your arms (e.g., wheelchair or bedside chair)?: Total Help needed to walk in hospital room?: Total Help needed climbing 3-5 steps with a railing? : Total 6 Click Score: 9    End of Session Equipment Utilized During Treatment: Gait belt Activity Tolerance: Patient limited by fatigue Patient left: in bed;with call bell/phone within reach;with bed alarm set Nurse Communication: Mobility status PT Visit Diagnosis: Unsteadiness on feet (R26.81);Other abnormalities of gait and mobility (R26.89);Muscle weakness (generalized) (M62.81)     Time: 7425-9563 PT Time Calculation (min) (ACUTE ONLY): 25 min  Charges:  $Therapeutic Activity: 23-37 mins                     Reinaldo Berber, PT, DPT Acute Rehabilitation Services Pager: [phone] Office: [phone]     Reinaldo Berber 09/16/2018, 11:32 AM

## 2018-09-17 ENCOUNTER — Inpatient Hospital Stay (HOSPITAL_COMMUNITY): Payer: Medicare Other

## 2018-09-17 LAB — MAGNESIUM: Magnesium: 2.2 mg/dL (ref 1.7–2.4)

## 2018-09-17 LAB — BASIC METABOLIC PANEL
Anion gap: 11 (ref 5–15)
BUN: 24 mg/dL — ABNORMAL HIGH (ref 8–23)
CO2: 23 mmol/L (ref 22–32)
Calcium: 9.4 mg/dL (ref 8.9–10.3)
Chloride: 104 mmol/L (ref 98–111)
Creatinine, Ser: 1.37 mg/dL — ABNORMAL HIGH (ref 0.61–1.24)
GFR calc Af Amer: >60 mL/min (ref >60)
GFR calc non Af Amer: 52 mL/min — ABNORMAL LOW (ref >60)
Glucose, Bld: 124 mg/dL — ABNORMAL HIGH (ref 70–99)
Potassium: 3.9 mmol/L (ref 3.5–5.1)
Sodium: 138 mmol/L (ref 135–145)

## 2018-09-17 LAB — CBC
HCT: 44.7 % (ref 39.0–52.0)
Hemoglobin: 14.8 g/dL (ref 13.0–17.0)
MCH: 30.6 pg (ref 26.0–34.0)
MCHC: 33.1 g/dL (ref 30.0–36.0)
MCV: 92.4 fL (ref 80.0–100.0)
Platelets: 409 10*3/uL — ABNORMAL HIGH (ref 150–400)
RBC: 4.84 MIL/uL (ref 4.22–5.81)
RDW: 12.3 % (ref 11.5–15.5)
WBC: 11 10*3/uL — ABNORMAL HIGH (ref 4.0–10.5)
nRBC: 0 % (ref 0.0–0.2)

## 2018-09-17 LAB — GLUCOSE, CAPILLARY
Glucose-Capillary: 107 mg/dL — ABNORMAL HIGH (ref 70–99)
Glucose-Capillary: 110 mg/dL — ABNORMAL HIGH (ref 70–99)
Glucose-Capillary: 120 mg/dL — ABNORMAL HIGH (ref 70–99)
Glucose-Capillary: 123 mg/dL — ABNORMAL HIGH (ref 70–99)
Glucose-Capillary: 124 mg/dL — ABNORMAL HIGH (ref 70–99)
Glucose-Capillary: 99 mg/dL (ref 70–99)

## 2018-09-17 NOTE — Progress Notes (Signed)
  Speech Language Pathology Treatment: Dysphagia  Patient Details Name: Jeremiah [name] MRN: [id number] DOB: 1945/11/13 Today's Date: 09/17/2018 Time: 7001-7494 SLP Time Calculation (min) (ACUTE ONLY): 30 min  Assessment / Plan / Recommendation Clinical Impression  F/u after MBS for treatment session focusing on dysphagia.  Pt provided with a [place] sandwich, applesauce, and water.  Repositioned for optimal safety and mitts removed from hands.  Pt confused, disoriented to place/time/situation, but enthusiastic to begin eating and interactive, laughing, talking about his past.  With tray set-up, he was able to feed himself 1/4 sandwich, 4 oz applesauce, and took sips of water from side of cup with no overt s/s of aspiration.  He ate slowly, but it was functional.  Precautions posted at [facility].  SLP will follow for swallow/cognition D/C.     HPI HPI: 73 year old male admitted 08/31/2018 to [facility] with SOB/ AECOPD, required intubation. Found to have small LLL PE and 46mm left frontal lobe hemorrhage. Transferred to [facility]. PMH: Gold III COPD, OSA, HTN, PTSD, depression.      SLP Plan  Continue with current plan of care       Recommendations  Diet recommendations: Dysphagia 3 (mechanical soft);Thin liquid Liquids provided via: Cup Medication Administration: Crushed with puree Supervision: Patient able to self feed Compensations: Minimize environmental distractions Postural Changes and/or Swallow Maneuvers: Seated upright 90 degrees                Oral Care Recommendations: Oral care BID Follow up Recommendations: Inpatient Rehab SLP Visit Diagnosis: Dysphagia, oropharyngeal phase (R13.12) Plan: Continue with current plan of care       GO                Jeremiah [name] 09/17/2018, 3:37 PM  Janet Humphreys L. Tivis Ringer, [place] Office number [phone] Pager [phone]

## 2018-09-17 NOTE — Progress Notes (Signed)
Bed alarm was activated in patients room. When [place], NT came into the room to respond to the bed alarm, patient was found to be on his knees on the floor mat. Patient was helped back to bed. Patient denied any pain and denied hitting his head. Vital signs stable. Assessment completed without change from morning assessment. When asked why patient was trying to get out of bed he replied, "I don't know." MD notified with no further orders. Also, patients wife was called, but did not answer. Patient moved to room 14 and low bed ordered. Bed in lowest position, call bell in place, instruction on how to use call bell was given, and door left open. Patient also instructed to call before getting up. Will continue to monitor patient.

## 2018-09-17 NOTE — Progress Notes (Signed)
SLP cleared patient to be on a DYS. 3 diet. MD notified regarding continuous fluids and every four hour CBG. Amin, MD stated to continue fluids at 50 and to continue CBG every four hours. Orders followed.

## 2018-09-17 NOTE — Progress Notes (Signed)
Amin, MD notified regarding patient removing NG tube last night and that nightshift RN was unable to re-insert D/T difficult insertion. MD stated that he ordered a modified barium swallow to be completed today. Ok to leave out NG tube until [place] can be replaced tomorrow, if needed. Also, ok to hold PO meds at this time. Orders to start D5 1/2 NS at 50 ml/hr. Orders followed and completed. Will continue to monitor.

## 2018-09-17 NOTE — Progress Notes (Addendum)
Inpatient Rehabilitation Admissions Coordinator  I continue to follow pt's progress. His barriers to admit to inpt rehab have been his tolerance to participate in more intensive therapies, as well as the need for additional caregiver support at home. His wife is unable to provide any physical assistance due to her own medical issues. Wife and her brother, Dr. Wynetta Emery are aware that both barriers will have to be addressed before we can consider admit.  Danne Baxter, RN, MSN Rehab Admissions Coordinator [phone] 09/17/2018 4:27 PM

## 2018-09-17 NOTE — Progress Notes (Signed)
In to assess patient. Patient pulled his NGT out. Patient continues to be confused. Denies pulling tube out. Unable to replace NGT back. Both nares with resistance and patient was getting agitated. Will update MD.

## 2018-09-17 NOTE — Progress Notes (Signed)
Hilliard Clark, PT contacted about seeing patient today. Hilliard Clark stated that since it was late in the day he would be unable to come see patient, but he would be first on his list to see tomorrow.

## 2018-09-17 NOTE — Progress Notes (Signed)
Modified Barium Swallow Progress Note  Patient Details  Name: Jeremiah [name] MRN: [id number] Date of Birth: January 26, 1946  Today's Date: 09/17/2018  Modified Barium Swallow completed.  Full report located under Chart Review in the Imaging Section.  Brief recommendations include the following:  Clinical Impression  Pt presented with only a mild oropharyngeal dysphagia marked by prolonged but functional mastication, swallow that triggered at the level of the pyriform sinuses for all liquds, and mild vallecular residue throughout study.  During the initial bolus of thin liquid from a straw, there was immediate, trace aspiration to the shelf of the trachea.  Pt sensed the aspirate and coughed in response.  All subsequent swallows of thins - when self-feeding from the side of a cup- were tolerated with no further aspiration.  Recommend initiating a dysphagia 3 diet, thin liquids, crush meds and give in puree.  Please assist pt with tray-set up and remove mitts during meals so that he may feed himself.  Results conveyed to RN.     Swallow Evaluation Recommendations       SLP Diet Recommendations: Dysphagia 3 (Mech soft) solids;Thin liquid   Liquid Administration via: Cup;No straw   Medication Administration: Whole meds with puree   Supervision: Patient able to self feed(assist with tray set up)   Compensations: Minimize environmental distractions       Oral Care Recommendations: Oral care BID       Shyra Emile L. Tivis Ringer, [place] Office number [phone] Pager [phone]  Juan Quam Laurice 09/17/2018,3:34 PM

## 2018-09-17 NOTE — Progress Notes (Signed)
Occupational Therapy Treatment Patient Details Name: Jeremiah [name] MRN: [id number] DOB: May 01, 1945 Today's Date: 09/17/2018    History of present illness Pt adm to [facility] with respiratory failure due to copd exacerbation. Pt also found to have small LLL PE and small 25mm left frontal lobe hemorrhage. Transferred to [facility]. Pt intubated 5/18-5/27. Pt also with confusion. PMH - COPD, OSA, HTN, PTSD, Depression   OT comments  Pt progressing towards acute OT goals. Focus of session was functional transfers and working on standing balance and activity tolerance during ADLs. Pt confused and tearful at times, conversational. He does seem more aware of his cognitive deficits today. Pt seeking clarification on what happened to him. Pt completed 3x sit<>stands and stood close to a minute each time with some dynamic tasks incorporated, mod A. Pt with some impulsivity and decreased safety awareness. Noted to sit suddenly with decreased awareness of position of his body in relation to EOB, cued to scoot back to a more secure position. Seems to be tolerating more activity during today's session therefore updating d/c recommendation to CIR referral.    Follow Up Recommendations  CIR    Equipment Recommendations  (defer to next venue of care)    Recommendations for Other Services      Precautions / Restrictions Precautions Precautions: Fall Precaution Comments: pushes to the right (mild), decreased safety awareness + impulsive at times Restrictions Weight Bearing Restrictions: No       Mobility Bed Mobility Overal bed mobility: Needs Assistance Bed Mobility: Supine to Sit;Sit to Supine     Supine to sit: Mod assist Sit to supine: Mod assist   General bed mobility comments: mod A to come to sitting EOB  Transfers Overall transfer level: Needs assistance Equipment used: Rolling walker (2 wheeled) Transfers: Sit to/from Stand Sit to Stand: Mod assist;From elevated surface         General  transfer comment: Steadying assist and assist to orient hips to safe position on EOB. Slow to stand, extra time and effort. Cues for technique with rw.    Balance Overall balance assessment: Needs assistance Sitting-balance support: Bilateral upper extremity supported;Feet supported Sitting balance-Leahy Scale: Poor Sitting balance - Comments: min guard to mod A. Pt at times leaning significantly out of base of support and needing cues to scoot back to a more stable position EOB. Sat close to 15 minutes. Postural control: Right lateral lean Standing balance support: Bilateral upper extremity supported Standing balance-Leahy Scale: Poor Standing balance comment: walker and at times mod A in standing (dynamic)                           ADL either performed or assessed with clinical judgement   ADL Overall ADL's : Needs assistance/impaired Eating/Feeding: NPO   Grooming: Wash/dry face;Set up;Min guard;Sitting;Minimal assistance Grooming Details (indicate cue type and reason): min A for throughness                             Functional mobility during ADLs: Moderate assistance;Rolling walker;+2 for safety/equipment General ADL Comments: Pt completed 3x sit<>stands with dynamic tasks incorporated. Mod A for balance. Decreased cognition, activity tolerance, balance deficits and R lateral lean impacting assist level with ADLs.     Vision       Perception     Praxis      Cognition Arousal/Alertness: Awake/alert Behavior During Therapy: Flat affect;Anxious;Impulsive(tearful) Overall Cognitive Status: No family/caregiver present  to determine baseline cognitive functioning Area of Impairment: Following commands;Problem solving;Safety/judgement;Orientation;Awareness;Attention                 Orientation Level: Place;Time Current Attention Level: Sustained   Following Commands: Follows one step commands consistently;Follows multi-step commands  inconsistently;Follows one step commands with increased time Safety/Judgement: Decreased awareness of safety;Decreased awareness of deficits Awareness: Intellectual(starting to show some emergent awareness) Problem Solving: Slow processing;Difficulty sequencing;Requires verbal cues General Comments: Tearful at times during session. Pt anxious about his mental state. Starting to become aware that he is confused and that his cognition is impaired. "I just don't understand." Pt conversational. At times impulsive particularlly when standing EOB: leaning far to the right side to view something out of the window with decreased regard for balance.         Exercises     Shoulder Instructions       General Comments      Pertinent Vitals/ Pain       Pain Assessment: No/denies pain  Home Living                                          Prior Functioning/Environment              Frequency  Min 3X/week        Progress Toward Goals  OT Goals(current goals can now be found in the care plan section)  Progress towards OT goals: Progressing toward goals  Acute Rehab OT Goals Patient Stated Goal: not stated OT Goal Formulation: Patient unable to participate in goal setting ADL Goals Pt Will Perform Grooming: with min guard assist;sitting Pt Will Transfer to Toilet: with min assist;stand pivot transfer;bedside commode Pt Will Perform Toileting - Clothing Manipulation and hygiene: with mod assist;sitting/lateral leans;sit to/from stand Additional ADL Goal #1: Pt will perform seated balance at EOB for 15 min at supervision level while performing ADL Additional ADL Goal #2: Pt will perform bed mobility at min guard level prior to engaging in ADL  Plan Discharge plan needs to be updated;Frequency needs to be updated    Co-evaluation                 AM-PAC OT "6 Clicks" Daily Activity     Outcome Measure   Help from another person eating meals?:  Total(NPO) Help from another person taking care of personal grooming?: A Lot Help from another person toileting, which includes using toliet, bedpan, or urinal?: A Lot Help from another person bathing (including washing, rinsing, drying)?: A Lot Help from another person to put on and taking off regular upper body clothing?: A Lot Help from another person to put on and taking off regular lower body clothing?: A Lot 6 Click Score: 11    End of Session Equipment Utilized During Treatment: Gait belt;Rolling walker  OT Visit Diagnosis: Unsteadiness on feet (R26.81);Other abnormalities of gait and mobility (R26.89);Muscle weakness (generalized) (M62.81);Other symptoms and signs involving cognitive function;Other symptoms and signs involving the nervous system (R29.898);Hemiplegia and hemiparesis Hemiplegia - Right/Left: Right Hemiplegia - dominant/non-dominant: Dominant Hemiplegia - caused by: Nontraumatic intracerebral hemorrhage   Activity Tolerance Patient tolerated treatment well;Patient limited by fatigue   Patient Left in bed;with call bell/phone within reach;with bed alarm set;Other (comment)(soft mitten restraints reapplied)   Nurse Communication Mobility status;Other (comment)(cognition)        Time: 1660-6301 OT Time Calculation (min): 30 min  Charges: OT General Charges $OT Visit: 1 Visit OT Treatments $Self Care/Home Management : 23-37 mins  Tyrone Schimke, OT Acute Rehabilitation Services Pager: [phone] Office: [phone]   Hortencia Pilar 09/17/2018, 1:21 PM

## 2018-09-17 NOTE — Progress Notes (Signed)
PROGRESS NOTE    Jeremiah [name]  VVO:[id number] DOB: 01-Nov-1945 DOA: 08/31/2018 PCP: Selena Lesser, MD   Brief Narrative:  73 year old with past medical history of COPD, obstructive sleep apnea, essential hypertension, PTSD, depression initially presented to any pain hospital on 5/18 with shortness of breath.  Failed NIPPV therefore had to be intubated.  Had significant amount of bronchospasm requiring intubation.  CTA chest showed incidental finding of small left lower lobe PE, CT of the head showed small 5 mm left frontal lobe hemorrhage and MRI confirmed this consistent with hypertension or amyloid angiopathy therefore heparin drip was deferred.  Abnormal EKG showing J-point elevation and cardiology recommended trending her troponin but no further intervention at this point.  Eventually extubated on 5/27.  Lower extremity Dopplers negative for DVT, echocardiogram showed ejection fraction 20-25% suspect stress-induced cardiomyopathy.  Due to agitation he was placed on Precedex and transition to Seroquel with Haldol as needed.  Completed course of steroid and avoiding any further use avoid worsening of delirium.   Assessment & Plan:   Active Problems:   COPD, frequent exacerbations ([place])   Intracerebral hemorrhage   Pulmonary embolism (HCC)   Acute combined systolic and diastolic heart failure (HCC)   Acute renal failure with acute cortical necrosis ([place])   Delirium due to another medical condition, acute, hyperactive   Dementia ([place])  Acute delirium Progressive dementia -Mostly remains hypoactive, but of and on tried to pull on tubes and lines..  Patient is calm and following commands. Answers very basic questions.  Off Precedex drip.  Currently on Seroquel, continue to monitor.  Haldol as needed. -Patient does have ongoing progressive dementia per family members.  Dysphagia Moderate to severe protein cal malnutrition -Failed S&S,  Pulled out Cortrak and then pulled out his NGT. Will  remain out today. Plans to be placed again by Cortrak team tomorrow. MBS eval per S&S today.  -In the meantime continue Accu-Chek, D5 half-normal saline  Acute on chronic COPD exacerbation, Improved. -Continue bronchodilators.  Completed course of IV steroids.  Continue to monitor.  Acute systolic congestive heart failure ejection fraction 25%- stress related, resolved Chronic diastolic congestive heart failure with preserved ejection fraction, 65%.  Appears euvolemic. -This was likely stress related.  Limited echocardiogram 6/2 shows recovery of ejection fraction to 60% with grade 1 diastolic dysfunction  Acute kidney injury secondary to cardiomyopathy; improved.  -This is improved with improvement in blood pressure.  Small left lower lobe pulmonary embolism -No obvious signs of hemodynamic instability.  Not on anticoagulation due to intracranial hemorrhage  Small left frontal lobe intracranial hemorrhage, due to hypertension versus amyloid angiopathy -No anticoagulation.  Continue to monitor.  No midline shift noted.  Please maintain soft restraints in place due to patient's occasional impulsive behavior with tendency pulling his IV line, getting out of bed, pulling on his feeding tube.  DVT prophylaxis: SCDs Code Status: Full code for family Family Communication: Spoke with Dr. Lysle Morales, patient's brother-in-law. Please update him daily. Very appreciate. Disposition Plan: Maintain hospital stay to ensure his feeding status has been addressed.  Eventually he is going to need inpatient rehabilitation.  Inpatient rehabilitation staff is currently awaiting IV progresses with inpatient therapy to make final determination  Consultants:   Cardiology   critical care  Procedures:   Intubated 5/18, extubated 5/27  Antimicrobials:   Azithromycin 1 day 5/18  Rocephin 5/18-5/22  Cefepime 5/22-5/25  Vancomycin 5/22- 5/25   Subjective: Pulled out NGT overnight. This morning  appears little calm  but trying to get out of his mittens. No complaints.   Review of Systems Otherwise negative except as per HPI, including: General: Denies fever, chills, night sweats or unintended weight loss. Resp: Denies cough, wheezing, shortness of breath. Cardiac: Denies chest pain, palpitations, orthopnea, paroxysmal nocturnal dyspnea. GI: Denies abdominal pain, nausea, vomiting, diarrhea or constipation GU: Denies dysuria, frequency, hesitancy or incontinence MS: Denies muscle aches, joint pain or swelling Neuro: Denies headache, neurologic deficits (focal weakness, numbness, tingling), abnormal gait Psych: Denies anxiety, depression, SI/HI/AVH Skin: Denies new rashes or lesions ID: Denies sick contacts, exotic exposures, travel  Objective: Vitals:   09/16/18 2100 09/17/18 0436 09/17/18 0833 09/17/18 0919  BP: 124/74 119/77  114/75  Pulse: (!) 103 91 94 88  Resp: 15 16 16 17   Temp: 98.4 F (36.9 C) 98.5 F (36.9 C)  98.1 F (36.7 C)  TempSrc:    Oral  SpO2: 96% 97% 98% 100%  Weight: 70 kg     Height:        Intake/Output Summary (Last 24 hours) at 09/17/2018 1115 Last data filed at 09/17/2018 1000 Gross per 24 hour  Intake 807.5 ml  Output 200 ml  Net 607.5 ml   Filed Weights   09/15/18 0500 09/15/18 2005 09/16/18 2100  Weight: 71.1 kg 70 kg 70 kg    Examination: Constitutional: Chronically ill.  Only alert to his name.  Soft restraints in place but does not appear to be any distress at the moment. Eyes: PERRL, lids and conjunctivae normal ENMT: Mucous membranes are moist. Posterior pharynx clear of any exudate or lesions.Normal dentition.  Neck: normal, supple, no masses, no thyromegaly Respiratory: clear to auscultation bilaterally, no wheezing, no crackles. Normal respiratory effort. No accessory muscle use.  Cardiovascular: Regular rate and rhythm, no murmurs / rubs / gallops. No extremity edema. 2+ pedal pulses. No carotid bruits.  Abdomen: no tenderness,  no masses palpated. No hepatosplenomegaly. Bowel sounds positive.  Musculoskeletal: no clubbing / cyanosis. No joint deformity upper and lower extremities. Good ROM, no contractures. Normal muscle tone.  Skin: no rashes, lesions, ulcers. No induration Neurologic: Unable to fully examine this but grossly moving all of his extremities. Psychiatric: Overall poor judgment and insight.  Only alert to his name.   Data Reviewed:   CBC: Recent Labs  Lab 09/13/18 0419 09/14/18 0432 09/15/18 1148 09/16/18 0456 09/17/18 0442  WBC 17.5* 15.4* 11.8* 11.4* 11.0*  HGB 15.0 15.6 14.4 15.3 14.8  HCT 45.6 47.8 43.5 46.1 44.7  MCV 92.1 93.2 91.2 92.9 92.4  PLT 355 379 352 410* 891*   Basic Metabolic Panel: Recent Labs  Lab 09/12/18 0430 09/13/18 0419 09/14/18 0432 09/15/18 1148 09/16/18 0456 09/17/18 0442  NA 146* 144 141 140 140 138  K 3.4* 3.9 3.9 4.0 4.0 3.9  CL 107 106 104 104 102 104  CO2 29 26 27 29 25 23   GLUCOSE 152* 128* 127* 134* 102* 124*  BUN 43* 32* 26* 23 20 24*  CREATININE 1.53* 1.50* 1.34* 1.35* 1.43* 1.37*  CALCIUM 10.3 10.0 9.9 9.6 9.8 9.4  MG 2.1 2.1  --  2.1 2.3 2.2   GFR: Estimated Creatinine Clearance: 49.7 mL/min (A) (by C-G formula based on SCr of 1.37 mg/dL (H)). Liver Function Tests: No results for input(s): AST, ALT, ALKPHOS, BILITOT, PROT, ALBUMIN in the last 168 hours. No results for input(s): LIPASE, AMYLASE in the last 168 hours. No results for input(s): AMMONIA in the last 168 hours. Coagulation Profile: No results for  input(s): INR, PROTIME in the last 168 hours. Cardiac Enzymes: No results for input(s): CKTOTAL, CKMB, CKMBINDEX, TROPONINI in the last 168 hours. BNP (last 3 results) No results for input(s): PROBNP in the last 8760 hours. HbA1C: No results for input(s): HGBA1C in the last 72 hours. CBG: Recent Labs  Lab 09/16/18 1633 09/16/18 2101 09/17/18 0028 09/17/18 0435 09/17/18 0839  GLUCAP 95 128* 124* 120* 107*   Lipid Profile:  No results for input(s): CHOL, HDL, LDLCALC, TRIG, CHOLHDL, LDLDIRECT in the last 72 hours. Thyroid Function Tests: No results for input(s): TSH, T4TOTAL, FREET4, T3FREE, THYROIDAB in the last 72 hours. Anemia Panel: No results for input(s): VITAMINB12, FOLATE, FERRITIN, TIBC, IRON, RETICCTPCT in the last 72 hours. Sepsis Labs: No results for input(s): PROCALCITON, LATICACIDVEN in the last 168 hours.  No results found for this or any previous visit (from the past 240 hour(s)).   Radiology Studies: Dg Abd Portable 1v  Result Date: 09/16/2018 CLINICAL DATA:  Encounter for nasogastric tube placement. EXAM: PORTABLE ABDOMEN - 1 VIEW COMPARISON:  None. FINDINGS: The bowel gas pattern is normal. Distal tip of nasogastric tube is seen in expected position of distal stomach. No radio-opaque calculi or other significant radiographic abnormality are seen. IMPRESSION: Distal tip of nasogastric tube seen in expected position of distal stomach. Electronically Signed   By: Marijo Conception M.D.   On: 09/16/2018 15:02    Scheduled Meds: . arformoterol  15 mcg Nebulization BID  . aspirin  81 mg Per Tube Daily  . budesonide (PULMICORT) nebulizer solution  0.5 mg Nebulization BID  . Chlorhexidine Gluconate Cloth  6 each Topical Daily  . famotidine  20 mg Per Tube QHS  . free water  300 mL Per Tube Q6H  . furosemide  20 mg Per Tube Daily  . insulin aspart  0-5 Units Subcutaneous Q4H  . ipratropium  0.5 mg Nebulization BID  . isosorbide-hydrALAZINE  1 tablet Per Tube TID  . mouth rinse  15 mL Mouth Rinse q12n4p  . pantoprazole sodium  40 mg Per Tube Daily  . pravastatin  10 mg Per Tube q1800  . QUEtiapine  50 mg Per Tube BID  . sodium chloride flush  10-40 mL Intracatheter Q12H   Continuous Infusions: . sodium chloride 10 mL/hr at 09/14/18 2300  . dextrose 5 % and 0.45% NaCl 300 mL/hr at 09/16/18 1800  . feeding supplement (JEVITY 1.2 CAL) Stopped (09/17/18 0230)     LOS: 17 days   Time spent= 25  mins     Arsenio Loader, MD [facility]  If 7PM-7AM, please contact night-coverage [email or web address] 09/17/2018, 11:15 AM

## 2018-09-18 ENCOUNTER — Telehealth: Payer: Self-pay

## 2018-09-18 LAB — BASIC METABOLIC PANEL
Anion gap: 10 (ref 5–15)
BUN: 22 mg/dL (ref 8–23)
CO2: 23 mmol/L (ref 22–32)
Calcium: 9.1 mg/dL (ref 8.9–10.3)
Chloride: 106 mmol/L (ref 98–111)
Creatinine, Ser: 1.41 mg/dL — ABNORMAL HIGH (ref 0.61–1.24)
GFR calc Af Amer: 58 mL/min — ABNORMAL LOW (ref >60)
GFR calc non Af Amer: 50 mL/min — ABNORMAL LOW (ref >60)
Glucose, Bld: 125 mg/dL — ABNORMAL HIGH (ref 70–99)
Potassium: 3.6 mmol/L (ref 3.5–5.1)
Sodium: 139 mmol/L (ref 135–145)

## 2018-09-18 LAB — CBC
HCT: 40.4 % (ref 39.0–52.0)
Hemoglobin: 13.5 g/dL (ref 13.0–17.0)
MCH: 30.7 pg (ref 26.0–34.0)
MCHC: 33.4 g/dL (ref 30.0–36.0)
MCV: 91.8 fL (ref 80.0–100.0)
Platelets: 392 10*3/uL (ref 150–400)
RBC: 4.4 MIL/uL (ref 4.22–5.81)
RDW: 12.4 % (ref 11.5–15.5)
WBC: 10.1 10*3/uL (ref 4.0–10.5)
nRBC: 0 % (ref 0.0–0.2)

## 2018-09-18 LAB — GLUCOSE, CAPILLARY
Glucose-Capillary: 103 mg/dL — ABNORMAL HIGH (ref 70–99)
Glucose-Capillary: 104 mg/dL — ABNORMAL HIGH (ref 70–99)
Glucose-Capillary: 110 mg/dL — ABNORMAL HIGH (ref 70–99)
Glucose-Capillary: 110 mg/dL — ABNORMAL HIGH (ref 70–99)
Glucose-Capillary: 93 mg/dL (ref 70–99)
Glucose-Capillary: 98 mg/dL (ref 70–99)

## 2018-09-18 LAB — MAGNESIUM: Magnesium: 2 mg/dL (ref 1.7–2.4)

## 2018-09-18 MED ORDER — ENSURE ENLIVE PO LIQD
237.0000 mL | Freq: Two times a day (BID) | ORAL | Status: DC
  Administered 2018-09-20 – 2018-09-21 (×3): 237 mL via ORAL

## 2018-09-18 MED ORDER — ADULT MULTIVITAMIN W/MINERALS CH
1.0000 | ORAL_TABLET | Freq: Every day | ORAL | Status: DC
  Administered 2018-09-18 – 2018-09-21 (×4): 1 via ORAL
  Filled 2018-09-18 (×4): qty 1

## 2018-09-18 NOTE — Progress Notes (Signed)
SLP Cancellation Note  Patient Details Name: Jeremiah [name] MRN: [id number] DOB: 1946-04-15   Cancelled treatment:       Reason Eval/Treat Not Completed: Patient declined, no reason specified(Pt was approached for cognitive-linguistic tx sesssion but was minimally interactive with the SLP and requested that treatment be deferred. SLP will follow up.)  Maisie Hauser I. Hardin Negus, [place], [place] Office number [phone] Pager [place] 09/18/2018, 10:42 AM

## 2018-09-18 NOTE — TOC Progression Note (Signed)
Transition of Care [facility]) - Progression Note    Patient Details  Name: Jeremiah [name] MRN: [id number] Date of Birth: 05-20-1945  Transition of Care [facility]) CM/SW Contact  Bartholomew Crews, RN Phone Number: [phone] 09/18/2018, 1:53 PM  Clinical Narrative:    Spoke with MD about option for patient to go to LTAC. Reached out to liaison at [facility] to discuss patient needs. No LTAC needs identified at this time. MD notified.    Expected Discharge Plan: Skilled Nursing Facility Barriers to Discharge: Continued Medical Work up  Expected Discharge Plan and Services Expected Discharge Plan: [place]   Discharge Planning Services: CM Consult                                           Social Determinants of Health (SDOH) Interventions    Readmission Risk Interventions No flowsheet data found.

## 2018-09-18 NOTE — Progress Notes (Signed)
Inpatient Rehabilitation Admissions Coordinator  I continue to recommend SNF rehab at this time. I will follow .  Danne Baxter, RN, MSN Rehab Admissions Coordinator [phone] 09/18/2018 11:25 AM

## 2018-09-18 NOTE — TOC Initial Note (Signed)
Transition of Care [facility]) - Initial/Assessment Note    Patient Details  Name: Jeremiah [name] MRN: [id number] Date of Birth: 03-01-1946  Transition of Care [facility]) CM/SW Contact:    Sable Feil, LCSW Phone Number: 09/18/2018, 7:23 PM  Clinical Narrative: CSW talked with patient's by phone regarding his discharge disposition. Patient declined by CIR and wife in agreement with ST rehab in a skilled facility as she thinks it is best, however she did express concern regarding the COVID epidemic. Mrs. Jindra explained that she has an autoimmune disorder and cannot left anything over 10 pounds. Wife added that she does not want her husband to go to a [place] facility. When asked, Mrs. Pena reported that her husband has not been to SNF for ST rehab, however she has. Wife advised CSW that they live in [place] and she wants a facility in their county of residence or in [place]. She added that she does not want the SNF in [place].                   Expected Discharge Plan: Skilled Nursing Facility Barriers to Discharge: No SNF bed   Patient Goals and CMS Choice Patient states their goals for this hospitalization and ongoing recovery are:: Wife thinks SNF is best for her husband CMS Medicare.gov Compare Post Acute Care list provided to:: Other (Comment Required)(Wife provided with Medicare.gov information) Choice offered to / list presented to : Spouse  Expected Discharge Plan and Services Expected Discharge Plan: [place]   Discharge Planning Services: CM Consult Post Acute Care Choice: [place] Living arrangements for the past 2 months: Single Family Home                                      Prior Living Arrangements/Services Living arrangements for the past 2 months: Single Family Home Lives with:: Spouse Patient language and need for interpreter reviewed:: No Do you feel safe going back to the place where you live?: No       Need for Family Participation in Patient Care: Yes (Comment) Care giver support system in place?: Yes (comment)   Criminal Activity/Legal Involvement Pertinent to Current Situation/Hospitalization: No - Comment as needed  Activities of Daily Living      Permission Sought/Granted Permission sought to share information with : Family Supports Permission granted to share information with : No(Patient oriented to self only)  Share Information with NAME: Ronel Rodeheaver     Permission granted to share info w Relationship: Wife  Permission granted to share info w Contact Information: [phone]  Emotional Assessment Appearance:: Other (Comment Required(Did not visit with patient) Attitude/Demeanor/Rapport: Unable to Assess Affect (typically observed): Unable to Assess Orientation: : Oriented to Self Alcohol / Substance Use: Tobacco Use, Alcohol Use, Illicit Drugs(Per H&P patient quit smoking, does consume alcohol and does not use illicit drugs) Psych Involvement: No (comment)  Admission diagnosis:  Acute respiratory failure with hypoxia ([place]) [J96.01] Patient Active Problem List   Diagnosis Date Noted  . Delirium due to another medical condition, acute, hyperactive 09/14/2018  . Dementia ([place]) 09/14/2018  . Acute combined systolic and diastolic heart failure ([place]) 09/07/2018  . Acute renal failure with acute cortical necrosis ([place]) 09/07/2018  . Pulmonary embolism ([place])   . Intracerebral hemorrhage 08/31/2018  . Chronic rhinitis 03/02/2018  . Chronic cough 01/22/2017  . Constipation 01/01/2016  . Acute URI 03/13/2015  .  Medicare annual wellness visit, subsequent 12/28/2014  . Essential hypertension 12/12/2014  . Generalized anxiety disorder 11/02/2014  . OSA (obstructive sleep apnea) 04/26/2014  . COPD, frequent exacerbations ([place]) 04/25/2013   PCP:  Selena Lesser, MD Pharmacy:   [id number] - [place], [place] - [address] [place] [address] Phone: [phone] Fax: [phone]     Social Determinants of Health (SDOH) Interventions  No SDOH interventions needed at this time.  Readmission Risk Interventions No flowsheet data found.

## 2018-09-18 NOTE — Progress Notes (Signed)
Physical Therapy Treatment Patient Details Name: Jeremiah [name] MRN: [id number] DOB: 03/01/1946 Today's Date: 09/18/2018    History of Present Illness Pt adm to [facility] with respiratory failure due to copd exacerbation. Pt also found to have small LLL PE and small 55mm left frontal lobe hemorrhage. Transferred to [facility]. Pt intubated 5/18-5/27. Pt also with confusion. PMH - COPD, OSA, HTN, PTSD, Depression    PT Comments    Patient demonstrating more participation and tolerance to session today, now ambulating short distances with multimodal cues for mechanics. Updated recs to CIR as I believe patient is now able to tolerate and would benefit greatly for highly skilled intensive post acute rehab.      Follow Up Recommendations  CIR     Equipment Recommendations  (TBD next venue)    Recommendations for Other Services       Precautions / Restrictions Precautions Precautions: Fall Precaution Comments: pushes to the right (mild), decreased safety awareness + impulsive at times Restrictions Weight Bearing Restrictions: No    Mobility  Bed Mobility Overal bed mobility: Needs Assistance Bed Mobility: Supine to Sit;Sit to Supine     Supine to sit: Min assist Sit to supine: Min assist   General bed mobility comments: min a to come to sitting  Transfers Overall transfer level: Needs assistance Equipment used: Rolling walker (2 wheeled) Transfers: Sit to/from Stand Sit to Stand: Mod assist;From elevated surface         General transfer comment: mod A to stand, cues for han dplacement on RW, static and dynamic balance activites   Ambulation/Gait Ambulation/Gait assistance: Min assist Gait Distance (Feet): 15 Feet Assistive device: Rolling walker (2 wheeled) Gait Pattern/deviations: Step-to pattern Gait velocity: decreased   General Gait Details: min A for stabilization today, R LOB several times, narrow BOS. overall progress from prior visit.    Stairs              Wheelchair Mobility    Modified Rankin (Stroke Patients Only)       Balance Overall balance assessment: Needs assistance Sitting-balance support: Bilateral upper extremity supported;Feet supported Sitting balance-Leahy Scale: Poor Sitting balance - Comments: min guard to mod A. Pt at times leaning significantly out of base of support and needing cues to scoot back to a more stable position EOB. Sat close to 15 minutes. Postural control: Right lateral lean Standing balance support: Bilateral upper extremity supported Standing balance-Leahy Scale: Poor Standing balance comment: walker and at times mod A in standing (dynamic)                            Cognition Arousal/Alertness: Awake/alert Behavior During Therapy: Flat affect;Anxious;Impulsive(tearful) Overall Cognitive Status: No family/caregiver present to determine baseline cognitive functioning Area of Impairment: Following commands;Problem solving;Safety/judgement;Orientation;Awareness;Attention                 Orientation Level: Place;Time Current Attention Level: Sustained   Following Commands: Follows one step commands consistently;Follows multi-step commands inconsistently;Follows one step commands with increased time Safety/Judgement: Decreased awareness of safety;Decreased awareness of deficits Awareness: Intellectual(starting to show some emergent awareness) Problem Solving: Slow processing;Difficulty sequencing;Requires verbal cues General Comments: improving cognition, answering questions accurately and following cues.       Exercises      General Comments        Pertinent Vitals/Pain      Home Living  Prior Function            PT Goals (current goals can now be found in the care plan section) Acute Rehab PT Goals Patient Stated Goal: not stated PT Goal Formulation: Patient unable to participate in goal setting Time For Goal Achievement:  09/25/18 Potential to Achieve Goals: Fair Progress towards PT goals: Progressing toward goals    Frequency    Min 4X/week      PT Plan Discharge plan needs to be updated    Co-evaluation              AM-PAC PT "6 Clicks" Mobility   Outcome Measure  Help needed turning from your back to your side while in a flat bed without using bedrails?: A Lot Help needed moving from lying on your back to sitting on the side of a flat bed without using bedrails?: A Lot Help needed moving to and from a bed to a chair (including a wheelchair)?: A Lot Help needed standing up from a chair using your arms (e.g., wheelchair or bedside chair)?: Total Help needed to walk in hospital room?: Total Help needed climbing 3-5 steps with a railing? : Total 6 Click Score: 9    End of Session Equipment Utilized During Treatment: Gait belt Activity Tolerance: Patient limited by fatigue Patient left: in bed;with call bell/phone within reach;with bed alarm set Nurse Communication: Mobility status PT Visit Diagnosis: Unsteadiness on feet (R26.81);Other abnormalities of gait and mobility (R26.89);Muscle weakness (generalized) (M62.81)     Time: 1117-3567 PT Time Calculation (min) (ACUTE ONLY): 24 min  Charges:  $Gait Training: 8-22 mins $Therapeutic Activity: 8-22 mins                     Reinaldo Berber, PT, DPT Acute Rehabilitation Services Pager: [phone] Office: [place] 09/18/2018, 12:16 PM

## 2018-09-18 NOTE — Progress Notes (Signed)
Staffs responded to bed alarm and found pt on his knee. VSS. Pt denied pain.  No injury noted. Safety mats, low bed, call light in place. MD notified.   Ave Filter, RN

## 2018-09-18 NOTE — Telephone Encounter (Signed)
Returned call to patient's spouse.  States her husband, Kiyaan Haq is in the hospital today and has been told he needs to be discharged to rehab but told he didn't qualify for [facility] rehab center.  Wife doesn't want him to go to a nursing home and wanted Dr. Chase Caller to recommend a possible place that the patient could go today.  Advised that our physicians are not in the office currently as it is after hours and there are no providers in office.  Wife requested that I call the hospital and see if I could 'put some pressure' on the social workers to make a plan for him today.  States the social worker told her it was 5:40 and they could not do anything else today.  She had requested a call back but no one will call her back from the hospital.   Wife states she was told since the patient was to be discharged to nursing rehab facility today that she may have to pay his overnight stay and he may not be able to be moved until Monday due to the weekend.   Advised patient it was not within my scope to contact the hospital or rehab facilities on this issue but I understood her dilemma and concerns.  Explained to her that since the patient is in the hospital, she could call the hospital and ask for a nursing supervisor or someone covering administrative call if she has a complaint/concern.  Apologized that we were not able to offer assistance today in this matter.  Nothing further needed at this time.

## 2018-09-18 NOTE — Progress Notes (Signed)
  Speech Language Pathology Treatment: Dysphagia  Patient Details Name: Jeremiah [name] MRN: [id number] DOB: 1945/06/03 Today's Date: 09/18/2018 Time: 1200-1230 SLP Time Calculation (min) (ACUTE ONLY): 30 min  Assessment / Plan / Recommendation Clinical Impression  Pt not as clear today.  Assisted with lunch tray. He required max verbal/tactile cueing to engage in self-feeding. Demonstrated increased pocketing; had difficulty chewing meats so they were manually removed from mouth.  There were no s/s of aspiration with thin liquids; intake was minimal this afternoon.  Recommend continued dysphagia 3, however, meats should be chopped; thin liquids; crush meds.  Pt will require full supervision during meals and assistance with feeding as appropriate.    HPI HPI: 73 year old male admitted 08/31/2018 to [facility] with SOB/ AECOPD, required intubation. Found to have small LLL PE and 63mm left frontal lobe hemorrhage. Transferred to [facility]. PMH: Gold III COPD, OSA, HTN, PTSD, depression.      SLP Plan          Recommendations  Diet recommendations: Dysphagia 3 (mechanical soft);Thin liquid Liquids provided via: Cup Medication Administration: Crushed with puree Supervision: Full supervision/cueing for compensatory strategies;Staff to assist with self feeding Compensations: Minimize environmental distractions;Lingual sweep for clearance of pocketing Postural Changes and/or Swallow Maneuvers: Seated upright 90 degrees                Oral Care Recommendations: Oral care BID Follow up Recommendations: Inpatient Rehab SLP Visit Diagnosis: Dysphagia, oropharyngeal phase (R13.12)       GO                Juan Quam Laurice 09/18/2018, 1:45 PM  Nyonna Hargrove L. Tivis Ringer, [place] Office number [phone] Pager [phone]

## 2018-09-18 NOTE — Progress Notes (Signed)
Bed alarm activated. This staff member went into the room and found patient on his knees beside his bed on the floor mats. When I asked patient what he was doing he stated, "I don't know, I'm just praying, I'm fine!" Patient helped back to bed with no injuries noted. Vital signs obtained and stable. MD notified and telesitter order placed. Patients bed alarm placed on the most sensitive setting, other fall prevention settings in place, and patient educated on the importance of not getting out of bed unassisted. Patient shown how to use call bell when he needed help. Night shift RN updated of this information. Patients wife [name] called, but no answer.

## 2018-09-18 NOTE — Care Management Important Message (Signed)
Important Message  Patient Details  Name: Jeremiah [name] MRN: [id number] Date of Birth: Sep 05, 1945   Medicare Important Message Given:  Yes    Memory Argue 09/18/2018, 2:54 PM

## 2018-09-18 NOTE — Progress Notes (Signed)
PROGRESS NOTE    Jeremiah [name]  JSE:[id number] DOB: October 14, 1945 DOA: 08/31/2018 PCP: Selena Lesser, MD   Brief Narrative:  73 year old with past medical history of COPD, obstructive sleep apnea, essential hypertension, PTSD, depression initially presented to any pain hospital on 5/18 with shortness of breath.  Failed NIPPV therefore had to be intubated.  Had significant amount of bronchospasm requiring intubation.  CTA chest showed incidental finding of small left lower lobe PE, CT of the head showed small 5 mm left frontal lobe hemorrhage and MRI confirmed this consistent with hypertension or amyloid angiopathy therefore heparin drip was deferred.  Abnormal EKG showing J-point elevation and cardiology recommended trending her troponin but no further intervention at this point.  Eventually extubated on 5/27.  Lower extremity Dopplers negative for DVT, echocardiogram showed ejection fraction 20-25% suspect stress-induced cardiomyopathy.  Due to agitation he was placed on Precedex and transition to Seroquel with Haldol as needed.  Completed course of steroid and avoiding any further use avoid worsening of delirium.   Assessment & Plan:   Active Problems:   COPD, frequent exacerbations ([place])   Intracerebral hemorrhage   Pulmonary embolism (HCC)   Acute combined systolic and diastolic heart failure (HCC)   Acute renal failure with acute cortical necrosis (HCC)   Delirium due to another medical condition, acute, hyperactive   Dementia ([place])  Acute delirium; improved Progressive dementia -Mostly remains hypoactive, but of and on tried to pull on tubes and lines..  Patient is calm and following commands. Answers very basic questions.  Off Precedex drip.  Currently on Seroquel, continue to monitor.  Haldol as needed. -Patient does have ongoing progressive dementia per family members.  Dysphagia Moderate to severe protein cal malnutrition -Dysphagia 3 diet per S&S, monitor the amount of intake.  Cont accuchecks.  -In the meantime continue Accu-Chek, D5 half-normal saline, discontinue if eat adequate meals.   Acute on chronic COPD exacerbation, Improved. -Continue bronchodilators.  Completed course of IV steroids.  Continue to monitor.  Acute systolic congestive heart failure ejection fraction 25%- stress related, resolved Chronic diastolic congestive heart failure with preserved ejection fraction, 65%.  Appears euvolemic. -This was likely stress related.  Limited echocardiogram 6/2 shows recovery of ejection fraction to 60% with grade 1 diastolic dysfunction  Acute kidney injury secondary to cardiomyopathy; improved.  -This is improved with improvement in blood pressure.  Small left lower lobe pulmonary embolism -No obvious signs of hemodynamic instability.  Not on anticoagulation due to intracranial hemorrhage  Small left frontal lobe intracranial hemorrhage, due to hypertension versus amyloid angiopathy -No anticoagulation.  Continue to monitor.  No midline shift noted.  Please maintain soft restraints in place due to patient's occasional impulsive behavior with tendency pulling his IV line, getting out of bed, pulling on his feeding tube.  At this point he isnt a candidate for Rehab, family to make decision on SNF vs Home Health  I spoke with Dr. Gilmer Mor who was going to assist the patient getting outpatient home health evaluation for possible LTAC.  I have reached out to our case management to see if we can do this evaluation in the hospital so further determination can be made.  DVT prophylaxis: SCDs Code Status: Full code for family Family Communication: Spoke with Dr Wynetta Emery, brother in law. Spoke Dr Owens Shark his PCP.  Disposition Plan: Maintain hospital stay to ensure his feeding status has been addressed.  Eventually he is going to need inpatient rehabilitation.  Inpatient rehabilitation staff is currently awaiting IV  progresses with inpatient therapy to make final  determination  Consultants:   Cardiology   critical care  Procedures:   Intubated 5/18, extubated 5/27  Antimicrobials:   Azithromycin 1 day 5/18  Rocephin 5/18-5/22  Cefepime 5/22-5/25  Vancomycin 5/22- 5/25   Subjective: Had a small on his knees yesterday but no trauma.  This morning more awake but only alert to this name.  Spoke with Dr Wynetta Emery, brother in law, he isnt sure what they want to do yet. SNF vs home health. Asked to spoke with Dr Owens Shark his PCP.   Review of Systems Otherwise negative except as per HPI, including: General = no fevers, chills, dizziness, malaise, fatigue HEENT/EYES = negative for pain, redness, loss of vision, double vision, blurred vision, loss of hearing, sore throat, hoarseness, dysphagia Cardiovascular= negative for chest pain, palpitation, murmurs, lower extremity swelling Respiratory/lungs= negative for shortness of breath, cough, hemoptysis, wheezing, mucus production Gastrointestinal= negative for nausea, vomiting,, abdominal pain, melena, hematemesis Genitourinary= negative for Dysuria, Hematuria, Change in Urinary Frequency MSK = Negative for arthralgia, myalgias, Back Pain, Joint swelling  Neurology= Negative for headache, seizures, numbness, tingling  Psychiatry= Negative for anxiety, depression, suicidal and homocidal ideation Allergy/Immunology= Medication/Food allergy as listed  Skin= Negative for Rash, lesions, ulcers, itching   Objective: Vitals:   09/18/18 0817 09/18/18 0820 09/18/18 0828 09/18/18 0849  BP:    139/79  Pulse:    79  Resp:    18  Temp:    98 F (36.7 C)  TempSrc:    Oral  SpO2: 93% 93% 95% 98%  Weight:      Height:        Intake/Output Summary (Last 24 hours) at 09/18/2018 1155 Last data filed at 09/18/2018 0956 Gross per 24 hour  Intake 540 ml  Output 700 ml  Net -160 ml   Filed Weights   09/16/18 2100 09/17/18 2300 09/18/18 0500  Weight: 70 kg 66.6 kg 66.6 kg    Examination:  Constitutional: chroncally ill,  Eyes: PERRL, lids and conjunctivae normal ENMT: Mucous membranes are moist. Posterior pharynx clear of any exudate or lesions.Normal dentition.  Neck: normal, supple, no masses, no thyromegaly Respiratory: clear to auscultation bilaterally, no wheezing, no crackles. Normal respiratory effort. No accessory muscle use.  Cardiovascular: Regular rate and rhythm, no murmurs / rubs / gallops. No extremity edema. 2+ pedal pulses. No carotid bruits.  Abdomen: no tenderness, no masses palpated. No hepatosplenomegaly. Bowel sounds positive.  Musculoskeletal: no clubbing / cyanosis. No joint deformity upper and lower extremities. Good ROM, no contractures. Normal muscle tone.  Skin: no rashes, lesions, ulcers. No induration Neurologic: Awake but alert only to his name Psychiatric: poor judgement   Data Reviewed:   CBC: Recent Labs  Lab 09/14/18 0432 09/15/18 1148 09/16/18 0456 09/17/18 0442 09/18/18 0410  WBC 15.4* 11.8* 11.4* 11.0* 10.1  HGB 15.6 14.4 15.3 14.8 13.5  HCT 47.8 43.5 46.1 44.7 40.4  MCV 93.2 91.2 92.9 92.4 91.8  PLT 379 352 410* 409* 161   Basic Metabolic Panel: Recent Labs  Lab 09/13/18 0419 09/14/18 0432 09/15/18 1148 09/16/18 0456 09/17/18 0442 09/18/18 0410  NA 144 141 140 140 138 139  K 3.9 3.9 4.0 4.0 3.9 3.6  CL 106 104 104 102 104 106  CO2 26 27 29 25 23 23   GLUCOSE 128* 127* 134* 102* 124* 125*  BUN 32* 26* 23 20 24* 22  CREATININE 1.50* 1.34* 1.35* 1.43* 1.37* 1.41*  CALCIUM 10.0 9.9 9.6 9.8 9.4  9.1  MG 2.1  --  2.1 2.3 2.2 2.0   GFR: Estimated Creatinine Clearance: 45.3 mL/min (A) (by C-G formula based on SCr of 1.41 mg/dL (H)). Liver Function Tests: No results for input(s): AST, ALT, ALKPHOS, BILITOT, PROT, ALBUMIN in the last 168 hours. No results for input(s): LIPASE, AMYLASE in the last 168 hours. No results for input(s): AMMONIA in the last 168 hours. Coagulation Profile: No results for input(s): INR, PROTIME  in the last 168 hours. Cardiac Enzymes: No results for input(s): CKTOTAL, CKMB, CKMBINDEX, TROPONINI in the last 168 hours. BNP (last 3 results) No results for input(s): PROBNP in the last 8760 hours. HbA1C: No results for input(s): HGBA1C in the last 72 hours. CBG: Recent Labs  Lab 09/17/18 2009 09/18/18 0000 09/18/18 0355 09/18/18 0647 09/18/18 1037  GLUCAP 110* 104* 110* 98 103*   Lipid Profile: No results for input(s): CHOL, HDL, LDLCALC, TRIG, CHOLHDL, LDLDIRECT in the last 72 hours. Thyroid Function Tests: No results for input(s): TSH, T4TOTAL, FREET4, T3FREE, THYROIDAB in the last 72 hours. Anemia Panel: No results for input(s): VITAMINB12, FOLATE, FERRITIN, TIBC, IRON, RETICCTPCT in the last 72 hours. Sepsis Labs: No results for input(s): PROCALCITON, LATICACIDVEN in the last 168 hours.  No results found for this or any previous visit (from the past 240 hour(s)).   Radiology Studies: Dg Abd Portable 1v  Result Date: 09/16/2018 CLINICAL DATA:  Encounter for nasogastric tube placement. EXAM: PORTABLE ABDOMEN - 1 VIEW COMPARISON:  None. FINDINGS: The bowel gas pattern is normal. Distal tip of nasogastric tube is seen in expected position of distal stomach. No radio-opaque calculi or other significant radiographic abnormality are seen. IMPRESSION: Distal tip of nasogastric tube seen in expected position of distal stomach. Electronically Signed   By: Marijo Conception M.D.   On: 09/16/2018 15:02    Scheduled Meds: . arformoterol  15 mcg Nebulization BID  . aspirin  81 mg Per Tube Daily  . budesonide (PULMICORT) nebulizer solution  0.5 mg Nebulization BID  . Chlorhexidine Gluconate Cloth  6 each Topical Daily  . famotidine  20 mg Per Tube QHS  . feeding supplement (ENSURE ENLIVE)  237 mL Oral BID BM  . furosemide  20 mg Per Tube Daily  . insulin aspart  0-5 Units Subcutaneous Q4H  . ipratropium  0.5 mg Nebulization BID  . isosorbide-hydrALAZINE  1 tablet Per Tube TID  .  mouth rinse  15 mL Mouth Rinse q12n4p  . multivitamin with minerals  1 tablet Oral Daily  . pantoprazole sodium  40 mg Per Tube Daily  . pravastatin  10 mg Per Tube q1800  . QUEtiapine  50 mg Per Tube BID  . sodium chloride flush  10-40 mL Intracatheter Q12H   Continuous Infusions: . sodium chloride 10 mL/hr at 09/14/18 2300  . dextrose 5 % and 0.45% NaCl 50 mL/hr at 09/18/18 0729     LOS: 18 days   Time spent= 25 mins    Ankit Arsenio Loader, MD [facility]  If 7PM-7AM, please contact night-coverage [email or web address] 09/18/2018, 11:55 AM

## 2018-09-18 NOTE — Progress Notes (Signed)
Nutrition Follow-up  DOCUMENTATION CODES:   Underweight  INTERVENTION:   -D/c Jevity 1.2 and free water flushes as pt no longer has feeding access -MVI with minerals daily -Ensure Enlive po BID, each supplement provides 350 kcal and 20 grams of protein -Magic Cup TID with meals, each supplement provides 290 kcals and 9 grams protein -Hormel Shake TID with meals, each supplement provides 520 kcals and 22 grams protein -Feeding assistance with meals  NUTRITION DIAGNOSIS:   Inadequate oral intake related to lethargy/confusion, decreased appetite as evidenced by meal completion < 50%.  Progressing; pt advanced to a dysphagia 3 diet with thin liquids  GOAL:   Patient will meet greater than or equal to 90% of their needs  Progressing  MONITOR:   PO intake, Supplement acceptance, Diet advancement, Labs, Weight trends, Skin, I & O's  REASON FOR ASSESSMENT:   Ventilator, Consult Enteral/tube feeding initiation and management  ASSESSMENT:   73 year old male who presented to the ED on 5/18 with SOB. PMH of COPD, HTN, PTSD. Pt found to have Takotsubo cardiomyopathy/acute myocarditis, small LLL PE, and small left front lobe hemorrhage. Pt intubated in the ED. Pt was extubated to nasal cannula on 5/19. Pt reintubated later in the day on 5/19 for agitation and possible stridor.  5/20- Cortrak placed  5/27- extubated  6/3- NGT out, s/p MBSS- advanced to dysphagia 3 diet with thin liquids  Pt sleeping in bed at time of visit with blankets covering his head. Pt did not respond to his name being called. Noted dentures and unopened carton of milk on tray table.   Pt advanced to a dysphagia 3 diet with thin liquids after MBSS yesterday. Meal completion documented at 25-50%. Pt would greatly benefit from addition of oral nutrition supplements to help pt meet nutritional needs. RD will d/c TF orders, as pt no longer has enteral access.   Per MD notes, pt will likely require inpatient rehab  at discharge.  Labs reviewed: CBGS: 98-110 (inpatient orders for glycemic control are 0-5 units insulin aspart every 4 hours).   Diet Order:   Diet Order            DIET DYS 3 Room service appropriate? Yes; Fluid consistency: Thin  Diet effective now              EDUCATION NEEDS:   No education needs have been identified at this time  Skin:  Skin Assessment: Skin Integrity Issues: Skin Integrity Issues:: Other (Comment) Other: MASD to bilateral buttocks  Last BM:  09/17/18  Height:   Ht Readings from Last 1 Encounters:  08/31/18 6\' 3"  (1.905 m)    Weight:   Wt Readings from Last 1 Encounters:  09/18/18 66.6 kg    Ideal Body Weight:  89.1 kg  BMI:  Body mass index is 18.35 kg/m.  Estimated Nutritional Needs:   Kcal:  2100-2300  Protein:  100-115 grams  Fluid:  >/= 1.8 L    Tiant Peixoto A. Jimmye Norman, RD, LDN, [place] Registered Dietitian II Certified Diabetes Care and Education Specialist Pager: [phone] After hours Pager: [phone]

## 2018-09-19 LAB — BASIC METABOLIC PANEL
Anion gap: 9 (ref 5–15)
BUN: 18 mg/dL (ref 8–23)
CO2: 24 mmol/L (ref 22–32)
Calcium: 8.9 mg/dL (ref 8.9–10.3)
Chloride: 106 mmol/L (ref 98–111)
Creatinine, Ser: 1.35 mg/dL — ABNORMAL HIGH (ref 0.61–1.24)
GFR calc Af Amer: >60 mL/min (ref >60)
GFR calc non Af Amer: 52 mL/min — ABNORMAL LOW (ref >60)
Glucose, Bld: 104 mg/dL — ABNORMAL HIGH (ref 70–99)
Potassium: 3.3 mmol/L — ABNORMAL LOW (ref 3.5–5.1)
Sodium: 139 mmol/L (ref 135–145)

## 2018-09-19 LAB — GLUCOSE, CAPILLARY
Glucose-Capillary: 103 mg/dL — ABNORMAL HIGH (ref 70–99)
Glucose-Capillary: 107 mg/dL — ABNORMAL HIGH (ref 70–99)
Glucose-Capillary: 108 mg/dL — ABNORMAL HIGH (ref 70–99)
Glucose-Capillary: 118 mg/dL — ABNORMAL HIGH (ref 70–99)
Glucose-Capillary: 123 mg/dL — ABNORMAL HIGH (ref 70–99)
Glucose-Capillary: 96 mg/dL (ref 70–99)

## 2018-09-19 LAB — CBC
HCT: 37.1 % — ABNORMAL LOW (ref 39.0–52.0)
Hemoglobin: 12.2 g/dL — ABNORMAL LOW (ref 13.0–17.0)
MCH: 30.3 pg (ref 26.0–34.0)
MCHC: 32.9 g/dL (ref 30.0–36.0)
MCV: 92.3 fL (ref 80.0–100.0)
Platelets: 374 10*3/uL (ref 150–400)
RBC: 4.02 MIL/uL — ABNORMAL LOW (ref 4.22–5.81)
RDW: 12.2 % (ref 11.5–15.5)
WBC: 9 10*3/uL (ref 4.0–10.5)
nRBC: 0 % (ref 0.0–0.2)

## 2018-09-19 LAB — MAGNESIUM: Magnesium: 2.1 mg/dL (ref 1.7–2.4)

## 2018-09-19 NOTE — Progress Notes (Signed)
Physical Therapy Treatment Patient Details Name: Jeremiah [name] MRN: [id number] DOB: August 05, 1945 Today's Date: 09/19/2018    History of Present Illness Pt adm to [facility] with respiratory failure due to copd exacerbation. Pt also found to have small LLL PE and small 63mm left frontal lobe hemorrhage. Transferred to [facility] Pt intubated 5/18-5/27. Pt also with confusion. PMH - COPD, OSA, HTN, PTSD, Depression    PT Comments    Patient cont to do well with therapy, progressing gait distance and level of independence standing today. cognition improving, following cues, more conversational, recalls instruction from prior visit however still with poor awareness and per RN has been climbing out of bed. When asked he says "to make sure I know how to make a move for when im ready to make a move out of here".  Highly recommend CIR, noted that family support is lacking requesting SNF. This will be next best option.     Follow Up Recommendations  CIR     Equipment Recommendations  (TBD next venue)    Recommendations for Other Services       Precautions / Restrictions Precautions Precautions: Fall Precaution Comments: pushes to the right (mild), decreased safety awareness + impulsive at times Restrictions Weight Bearing Restrictions: No    Mobility  Bed Mobility Overal bed mobility: Needs Assistance Bed Mobility: Supine to Sit;Sit to Supine     Supine to sit: Min assist Sit to supine: Min assist   General bed mobility comments: min a to come to sitting  Transfers Overall transfer level: Needs assistance Equipment used: Rolling walker (2 wheeled) Transfers: Sit to/from Stand Sit to Stand: Mod assist;From elevated surface            Ambulation/Gait Ambulation/Gait assistance: Min assist Gait Distance (Feet): 60 Feet Assistive device: Rolling walker (2 wheeled) Gait Pattern/deviations: Step-to pattern Gait velocity: decreased   General Gait Details: unsteady, poor awareness with RW,  mutlimodal cues throughout session for proximity, BOS. espcailyl when turning with RW patient will travel well outsdie of it. fatigues qucikly with DOE 2/4, HR 120, SpO2 95% ON ra   Stairs             Wheelchair Mobility    Modified Rankin (Stroke Patients Only) Modified Rankin (Stroke Patients Only) Pre-Morbid Rankin Score: No significant disability Modified Rankin: Moderately severe disability     Balance Overall balance assessment: Needs assistance Sitting-balance support: Bilateral upper extremity supported;Feet supported Sitting balance-Leahy Scale: Poor Sitting balance - Comments: min guard to mod A. Pt at times leaning significantly out of base of support and needing cues to scoot back to a more stable position EOB. Sat close to 15 minutes. Postural control: Right lateral lean Standing balance support: Bilateral upper extremity supported Standing balance-Leahy Scale: Poor Standing balance comment: walker and at times mod A in standing (dynamic)                            Cognition Arousal/Alertness: Awake/alert Behavior During Therapy: Flat affect;Anxious;Impulsive(tearful) Overall Cognitive Status: No family/caregiver present to determine baseline cognitive functioning Area of Impairment: Following commands;Problem solving;Safety/judgement;Orientation;Awareness;Attention                 Orientation Level: Place;Time Current Attention Level: Sustained   Following Commands: Follows one step commands consistently;Follows multi-step commands inconsistently;Follows one step commands with increased time Safety/Judgement: Decreased awareness of safety;Decreased awareness of deficits Awareness: Intellectual(starting to show some emergent awareness) Problem Solving: Slow processing;Difficulty sequencing;Requires verbal cues General  Comments: improving cognition, answering questions accurately and following cues.       Exercises      General Comments         Pertinent Vitals/Pain      Home Living                      Prior Function            PT Goals (current goals can now be found in the care plan section) Acute Rehab PT Goals Patient Stated Goal: not stated PT Goal Formulation: Patient unable to participate in goal setting Time For Goal Achievement: 09/25/18 Potential to Achieve Goals: Fair    Frequency    Min 4X/week      PT Plan Discharge plan needs to be updated    Co-evaluation              AM-PAC PT "6 Clicks" Mobility   Outcome Measure  Help needed turning from your back to your side while in a flat bed without using bedrails?: A Lot Help needed moving from lying on your back to sitting on the side of a flat bed without using bedrails?: A Lot Help needed moving to and from a bed to a chair (including a wheelchair)?: A Lot Help needed standing up from a chair using your arms (e.g., wheelchair or bedside chair)?: Total Help needed to walk in hospital room?: Total Help needed climbing 3-5 steps with a railing? : Total 6 Click Score: 9    End of Session Equipment Utilized During Treatment: Gait belt Activity Tolerance: Patient limited by fatigue Patient left: in bed;with call bell/phone within reach;with bed alarm set Nurse Communication: Mobility status PT Visit Diagnosis: Unsteadiness on feet (R26.81);Other abnormalities of gait and mobility (R26.89);Muscle weakness (generalized) (M62.81)     Time: 1750-1805 PT Time Calculation (min) (ACUTE ONLY): 15 min  Charges:  $Gait Training: 8-22 mins                     Reinaldo Berber, PT, DPT Acute Rehabilitation Services Pager: [phone] Office: [phone]     Reinaldo Berber 09/19/2018, 6:05 PM

## 2018-09-19 NOTE — Progress Notes (Signed)
PROGRESS NOTE    Jeremiah [name]  KGM:[id number] DOB: 02-17-1946 DOA: 08/31/2018 PCP: Selena Lesser, MD   Brief Narrative:  73 year old with past medical history of COPD, obstructive sleep apnea, essential hypertension, PTSD, depression initially presented to any pain hospital on 5/18 with shortness of breath.  Failed NIPPV therefore had to be intubated.  Had significant amount of bronchospasm requiring intubation.  CTA chest showed incidental finding of small left lower lobe PE, CT of the head showed small 5 mm left frontal lobe hemorrhage and MRI confirmed this consistent with hypertension or amyloid angiopathy therefore heparin drip was deferred.  Abnormal EKG showing J-point elevation and cardiology recommended trending her troponin but no further intervention at this point.  Eventually extubated on 5/27.  Lower extremity Dopplers negative for DVT, echocardiogram showed ejection fraction 20-25% suspect stress-induced cardiomyopathy.  Due to agitation he was placed on Precedex and transition to Seroquel with Haldol as needed.  Completed course of steroid and avoiding any further use avoid worsening of delirium.  09/19/2018: No significant changes.  Awaiting skilled nursing facility placement.   Assessment & Plan:   Active Problems:   COPD, frequent exacerbations ([place])   Intracerebral hemorrhage   Pulmonary embolism (HCC)   Acute combined systolic and diastolic heart failure (HCC)   Acute renal failure with acute cortical necrosis (HCC)   Delirium due to another medical condition, acute, hyperactive   Dementia ([place])  Acute delirium; improved Progressive dementia -Mostly remains hypoactive, but of and on tried to pull on tubes and lines..  Patient is calm and following commands. Answers very basic questions.  Off Precedex drip.  Currently on Seroquel, continue to monitor.  Haldol as needed. -Patient does have ongoing progressive dementia per family members. 09/19/2018: No behavioral problems  noted during the consultation.  Dysphagia Moderate to severe protein cal malnutrition -Dysphagia 3 diet per S&S, monitor the amount of intake. Cont accuchecks.  -In the meantime continue Accu-Chek, D5 half-normal saline, discontinue if eat adequate meals.   Acute on chronic COPD exacerbation, Improved. -Continue bronchodilators.  Completed course of IV steroids.  Continue to monitor.  Acute systolic congestive heart failure ejection fraction 25%- stress related, resolved Chronic diastolic congestive heart failure with preserved ejection fraction, 65%.  Appears euvolemic. -This was likely stress related.  Limited echocardiogram 6/2 shows recovery of ejection fraction to 60% with grade 1 diastolic dysfunction  Acute kidney injury secondary to cardiomyopathy; improved.  -This has continued to improve.   Continue to monitor.    Small left lower lobe pulmonary embolism -No obvious signs of hemodynamic instability.  Not on anticoagulation due to intracranial hemorrhage  Small left frontal lobe intracranial hemorrhage, due to hypertension versus amyloid angiopathy -No anticoagulation.  Continue to monitor.  No midline shift noted.  Please maintain soft restraints in place due to patient's occasional impulsive behavior with tendency pulling his IV line, getting out of bed, pulling on his feeding tube.     DVT prophylaxis: SCDs Code Status: Full code for family Family Communication:  Disposition Plan: Skilled nursing facility with subacute rehab  Consultants:   Cardiology   critical care  Procedures:   Intubated 5/18, extubated 5/27  Antimicrobials:   Azithromycin 1 day 5/18  Rocephin 5/18-5/22  Cefepime 5/22-5/25  Vancomycin 5/22- 5/25   Subjective:   Objective: Vitals:   09/19/18 0845 09/19/18 0847 09/19/18 0848 09/19/18 0929  BP:    116/68  Pulse:    90  Resp:      Temp:  98.4 F (36.9 C)  TempSrc:    Oral  SpO2: 94% 94% 98% 98%  Weight:      Height:         Intake/Output Summary (Last 24 hours) at 09/19/2018 1427 Last data filed at 09/19/2018 0500 Gross per 24 hour  Intake 958.41 ml  Output 300 ml  Net 658.41 ml   Filed Weights   09/16/18 2100 09/17/18 2300 09/18/18 0500  Weight: 70 kg 66.6 kg 66.6 kg    Examination: Constitutional: Not in any distress. HEENT: Mild pallor. Neck: normal, supple, no masses, no thyromegaly Respiratory: clear to auscultation  Cardiovascular: S1-S2.   Abdomen: no tenderness, no masses palpated. No hepatosplenomegaly. Bowel sounds positive.  Musculoskeletal: No leg edema. Neurologic: Awake and alert.   Data Reviewed:   CBC: Recent Labs  Lab 09/15/18 1148 09/16/18 0456 09/17/18 0442 09/18/18 0410 09/19/18 0542  WBC 11.8* 11.4* 11.0* 10.1 9.0  HGB 14.4 15.3 14.8 13.5 12.2*  HCT 43.5 46.1 44.7 40.4 37.1*  MCV 91.2 92.9 92.4 91.8 92.3  PLT 352 410* 409* 392 195   Basic Metabolic Panel: Recent Labs  Lab 09/15/18 1148 09/16/18 0456 09/17/18 0442 09/18/18 0410 09/19/18 0542  NA 140 140 138 139 139  K 4.0 4.0 3.9 3.6 3.3*  CL 104 102 104 106 106  CO2 29 25 23 23 24   GLUCOSE 134* 102* 124* 125* 104*  BUN 23 20 24* 22 18  CREATININE 1.35* 1.43* 1.37* 1.41* 1.35*  CALCIUM 9.6 9.8 9.4 9.1 8.9  MG 2.1 2.3 2.2 2.0 2.1   GFR: Estimated Creatinine Clearance: 47.3 mL/min (A) (by C-G formula based on SCr of 1.35 mg/dL (H)). Liver Function Tests: No results for input(s): AST, ALT, ALKPHOS, BILITOT, PROT, ALBUMIN in the last 168 hours. No results for input(s): LIPASE, AMYLASE in the last 168 hours. No results for input(s): AMMONIA in the last 168 hours. Coagulation Profile: No results for input(s): INR, PROTIME in the last 168 hours. Cardiac Enzymes: No results for input(s): CKTOTAL, CKMB, CKMBINDEX, TROPONINI in the last 168 hours. BNP (last 3 results) No results for input(s): PROBNP in the last 8760 hours. HbA1C: No results for input(s): HGBA1C in the last 72 hours. CBG: Recent Labs   Lab 09/18/18 1946 09/19/18 0005 09/19/18 0359 09/19/18 0657 09/19/18 1115  GLUCAP 110* 107* 108* 118* 123*   Lipid Profile: No results for input(s): CHOL, HDL, LDLCALC, TRIG, CHOLHDL, LDLDIRECT in the last 72 hours. Thyroid Function Tests: No results for input(s): TSH, T4TOTAL, FREET4, T3FREE, THYROIDAB in the last 72 hours. Anemia Panel: No results for input(s): VITAMINB12, FOLATE, FERRITIN, TIBC, IRON, RETICCTPCT in the last 72 hours. Sepsis Labs: No results for input(s): PROCALCITON, LATICACIDVEN in the last 168 hours.  No results found for this or any previous visit (from the past 240 hour(s)).   Radiology Studies: No results found.  Scheduled Meds: . arformoterol  15 mcg Nebulization BID  . aspirin  81 mg Per Tube Daily  . budesonide (PULMICORT) nebulizer solution  0.5 mg Nebulization BID  . Chlorhexidine Gluconate Cloth  6 each Topical Daily  . famotidine  20 mg Per Tube QHS  . feeding supplement (ENSURE ENLIVE)  237 mL Oral BID BM  . furosemide  20 mg Per Tube Daily  . insulin aspart  0-5 Units Subcutaneous Q4H  . ipratropium  0.5 mg Nebulization BID  . isosorbide-hydrALAZINE  1 tablet Per Tube TID  . mouth rinse  15 mL Mouth Rinse q12n4p  . multivitamin with  minerals  1 tablet Oral Daily  . pantoprazole sodium  40 mg Per Tube Daily  . pravastatin  10 mg Per Tube q1800  . QUEtiapine  50 mg Per Tube BID  . sodium chloride flush  10-40 mL Intracatheter Q12H   Continuous Infusions: . sodium chloride 10 mL/hr at 09/14/18 2300  . dextrose 5 % and 0.45% NaCl 50 mL/hr at 09/19/18 0255     LOS: 19 days   Time spent= 25 mins    Bonnell Public, MD [facility]  If 7PM-7AM, please contact night-coverage [email or web address] 09/19/2018, 2:27 PM

## 2018-09-19 NOTE — NC FL2 (Signed)
[place] LEVEL OF CARE SCREENING TOOL     IDENTIFICATION  Patient Name: Jeremiah [name] Birthdate: 1945/08/11 Sex: male Admission Date (Current Location): 08/31/2018  [facility] and [place] Number:  Herbalist and Address:  [facility], [place] [address] [place], [place] [address]      Provider Number: [id number]  Attending Physician Name and Address:  Bonnell Public, MD  Relative Name and Phone Number:  [place], [place], [phone]    Current Level of Care: Hospital Recommended Level of Care: [place] Prior Approval Number:    Date Approved/Denied:   PASRR Number:    Discharge Plan: SNF    Current Diagnoses: Patient Active Problem List   Diagnosis Date Noted  . Delirium due to another medical condition, acute, hyperactive 09/14/2018  . Dementia ([place]) 09/14/2018  . Acute combined systolic and diastolic heart failure ([place]) 09/07/2018  . Acute renal failure with acute cortical necrosis ([place]) 09/07/2018  . Pulmonary embolism ([place])   . Intracerebral hemorrhage 08/31/2018  . Chronic rhinitis 03/02/2018  . Chronic cough 01/22/2017  . Constipation 01/01/2016  . Acute URI 03/13/2015  . Medicare annual wellness visit, subsequent 12/28/2014  . Essential hypertension 12/12/2014  . Generalized anxiety disorder 11/02/2014  . OSA (obstructive sleep apnea) 04/26/2014  . COPD, frequent exacerbations ([place]) 04/25/2013    Orientation RESPIRATION BLADDER Height & Weight     Self, Place  Normal Incontinent, External catheter Weight: 146 lb 13.2 oz (66.6 kg) Height:  6\' 3"  (190.5 cm)  BEHAVIORAL SYMPTOMS/MOOD NEUROLOGICAL BOWEL NUTRITION STATUS      Incontinent Diet(Dysphasia 3 diet, thin liquids, needs assistance)  AMBULATORY STATUS COMMUNICATION OF NEEDS Skin   Extensive Assist   Normal, Other (Comment)(MASD on buttocks, barrier cream)                       Personal Care Assistance Level of Assistance   Bathing, Feeding, Dressing, Total care Bathing Assistance: Maximum assistance Feeding assistance: Limited assistance Dressing Assistance: Maximum assistance Total Care Assistance: Maximum assistance   Functional Limitations Info  Sight, Hearing, Speech Sight Info: Adequate Hearing Info: Adequate Speech Info: Adequate    SPECIAL CARE FACTORS FREQUENCY  PT (By licensed PT), OT (By licensed OT), Speech therapy     PT Frequency: 5x/wk OT Frequency: 5x/wk     Speech Therapy Frequency: 3x/wk      Contractures Contractures Info: Not present    Additional Factors Info  Code Status, Allergies, Insulin Sliding Scale Code Status Info: Full Code Allergies Info: No Known Allergies   Insulin Sliding Scale Info: insulin aspart novolog 0-5 units every 4 hours       Current Medications (09/19/2018):  This is the current hospital active medication list Current Facility-Administered Medications  Medication Dose Route Frequency Provider Last Rate Last Dose  . 0.9 %  sodium chloride infusion   Intravenous PRN Kipp Brood, MD 10 mL/hr at 09/14/18 2300    . albuterol (PROVENTIL) (2.5 MG/3ML) 0.083% nebulizer solution 2.5 mg  2.5 mg Nebulization Q3H PRN Kipp Brood, MD   2.5 mg at 09/14/18 0352  . arformoterol (BROVANA) nebulizer solution 15 mcg  15 mcg Nebulization BID Kipp Brood, MD   15 mcg at 09/19/18 0845  . aspirin chewable tablet 81 mg  81 mg Per Tube Daily Kipp Brood, MD   81 mg at 09/18/18 0819  . budesonide (PULMICORT) nebulizer solution 0.5 mg  0.5 mg Nebulization BID Kipp Brood, MD  0.5 mg at 09/19/18 0845  . Chlorhexidine Gluconate Cloth 2 % PADS 6 each  6 each Topical Daily Kipp Brood, MD   6 each at 09/18/18 2206  . dextrose 5 %-0.45 % sodium chloride infusion   Intravenous Continuous Amin, Ankit Chirag, MD 50 mL/hr at 09/19/18 0255    . famotidine (PEPCID) tablet 20 mg  20 mg Per Tube QHS Kipp Brood, MD   20 mg at 09/18/18 2203  . feeding supplement  (ENSURE ENLIVE) (ENSURE ENLIVE) liquid 237 mL  237 mL Oral BID BM Amin, Ankit Chirag, MD      . furosemide (LASIX) tablet 20 mg  20 mg Per Tube Daily Agarwala, Ravi, MD   20 mg at 09/18/18 0819  . haloperidol lactate (HALDOL) injection 5 mg  5 mg Intravenous Q6H PRN Kipp Brood, MD   5 mg at 09/18/18 0012  . insulin aspart (novoLOG) injection 0-5 Units  0-5 Units Subcutaneous Q4H Kipp Brood, MD   1 Units at 09/12/18 0422  . ipratropium (ATROVENT) nebulizer solution 0.5 mg  0.5 mg Nebulization BID Kipp Brood, MD   0.5 mg at 09/19/18 0845  . isosorbide-hydrALAZINE (BIDIL) 20-37.5 MG per tablet 1 tablet  1 tablet Per Tube TID Kipp Brood, MD   1 tablet at 09/18/18 2203  . MEDLINE mouth rinse  15 mL Mouth Rinse q12n4p Agarwala, Ravi, MD   15 mL at 09/18/18 1156  . multivitamin with minerals tablet 1 tablet  1 tablet Oral Daily Damita Lack, MD   1 tablet at 09/18/18 1156  . pantoprazole sodium (PROTONIX) 40 mg/20 mL oral suspension 40 mg  40 mg Per Tube Daily Kipp Brood, MD   40 mg at 09/18/18 0819  . pravastatin (PRAVACHOL) tablet 10 mg  10 mg Per Tube [id number] Kipp Brood, MD   10 mg at 09/18/18 1634  . QUEtiapine (SEROQUEL) tablet 50 mg  50 mg Per Tube BID Kipp Brood, MD   50 mg at 09/18/18 2203  . sodium chloride flush (NS) 0.9 % injection 10-40 mL  10-40 mL Intracatheter Q12H Kipp Brood, MD   10 mL at 09/18/18 2204  . sodium chloride flush (NS) 0.9 % injection 10-40 mL  10-40 mL Intracatheter PRN Kipp Brood, MD   10 mL at 09/15/18 1806  . traZODone (DESYREL) tablet 50 mg  50 mg Oral QHS PRN Kipp Brood, MD   50 mg at 09/18/18 2203     Discharge Medications: Please see discharge summary for a list of discharge medications.  Relevant Imaging Results:  Relevant Lab Results:   Additional Information SSN: [id number]  Philippa Chester Lizette Pazos, LCSWA

## 2018-09-19 NOTE — Progress Notes (Signed)
Shouted by the  [employer] ,due to bed alarm was activated,we both ran into patient's room and found patient on knelling position,touching on soft pad on the floor,patient was facing the bed side rail ,were both hands were together over the side rail  and his head was slightly bending down.Over all patient's body position was almost on the space between the side rails.Patient was alert to self initially ,as if he was just awaken.Vital signs taken.Not complaining of any pain,No obvious injury on physical assessment.Patient was able to stood up with minimum help from the two staffs and able to moved back into the bed without pain or complaint of pain from this movement.Telle-monitor ordered,call and on line activation confirmed with tele sitter staff.Per unit's secretary ,it was the same position when the found him yesterday.Leadership made aware.M.D. made aware.

## 2018-09-19 NOTE — Progress Notes (Signed)
Physical Therapy Treatment Patient Details Name: Jeremiah [name] MRN: [id number] DOB: 1946/01/04 Today's Date: 09/19/2018    History of Present Illness Pt adm to [facility] with respiratory failure due to copd exacerbation. Pt also found to have small LLL PE and small 21mm left frontal lobe hemorrhage. Transferred to [facility]. Pt intubated 5/18-5/27. Pt also with confusion. PMH - COPD, OSA, HTN, PTSD, Depression    PT Comments    Patient with improving cognition, increased independence with bed mobility and transfers today. Breakfast arrived, to chair with chair alarm on. Cont to rec CIR- noted in chart that patients family requesting SNF due to lack of support at home straight from CIR, updated recs to SNF to reflect.     Follow Up Recommendations  SNF     Equipment Recommendations  (TBD next venue)    Recommendations for Other Services       Precautions / Restrictions Precautions Precautions: Fall Precaution Comments: pushes to the right (mild), decreased safety awareness + impulsive at times Restrictions Weight Bearing Restrictions: No    Mobility  Bed Mobility Overal bed mobility: Needs Assistance Bed Mobility: Supine to Sit;Sit to Supine     Supine to sit: Min assist Sit to supine: Min assist   General bed mobility comments: min a to come to sitting  Transfers Overall transfer level: Needs assistance Equipment used: Rolling walker (2 wheeled) Transfers: Sit to/from Stand Sit to Stand: Mod assist;From elevated surface         General transfer comment: min A to stand today, imrpoved. stand pivot to chair with contact guard  Ambulation/Gait                 Stairs             Wheelchair Mobility    Modified Rankin (Stroke Patients Only)       Balance Overall balance assessment: Needs assistance Sitting-balance support: Bilateral upper extremity supported;Feet supported Sitting balance-Leahy Scale: Poor Sitting balance - Comments: min guard to mod A.  Pt at times leaning significantly out of base of support and needing cues to scoot back to a more stable position EOB. Sat close to 15 minutes. Postural control: Right lateral lean Standing balance support: Bilateral upper extremity supported Standing balance-Leahy Scale: Poor Standing balance comment: walker and at times mod A in standing (dynamic)                            Cognition Arousal/Alertness: Awake/alert Behavior During Therapy: Flat affect;Anxious;Impulsive(tearful) Overall Cognitive Status: No family/caregiver present to determine baseline cognitive functioning Area of Impairment: Following commands;Problem solving;Safety/judgement;Orientation;Awareness;Attention                 Orientation Level: Place;Time Current Attention Level: Sustained   Following Commands: Follows one step commands consistently;Follows multi-step commands inconsistently;Follows one step commands with increased time Safety/Judgement: Decreased awareness of safety;Decreased awareness of deficits Awareness: Intellectual(starting to show some emergent awareness) Problem Solving: Slow processing;Difficulty sequencing;Requires verbal cues General Comments: improving cognition, answering questions accurately and following cues.       Exercises      General Comments        Pertinent Vitals/Pain      Home Living Family/patient expects to be discharged to:: Skilled nursing facility Living Arrangements: Spouse/significant other                  Prior Function            PT Goals (  current goals can now be found in the care plan section) Acute Rehab PT Goals Patient Stated Goal: not stated PT Goal Formulation: Patient unable to participate in goal setting Time For Goal Achievement: 09/25/18 Potential to Achieve Goals: Fair    Frequency    Min 4X/week      PT Plan Discharge plan needs to be updated    Co-evaluation              AM-PAC PT "6 Clicks"  Mobility   Outcome Measure  Help needed turning from your back to your side while in a flat bed without using bedrails?: A Lot Help needed moving from lying on your back to sitting on the side of a flat bed without using bedrails?: A Lot Help needed moving to and from a bed to a chair (including a wheelchair)?: A Lot Help needed standing up from a chair using your arms (e.g., wheelchair or bedside chair)?: Total Help needed to walk in hospital room?: Total Help needed climbing 3-5 steps with a railing? : Total 6 Click Score: 9    End of Session Equipment Utilized During Treatment: Gait belt Activity Tolerance: Patient limited by fatigue Patient left: in bed;with call bell/phone within reach;with bed alarm set Nurse Communication: Mobility status PT Visit Diagnosis: Unsteadiness on feet (R26.81);Other abnormalities of gait and mobility (R26.89);Muscle weakness (generalized) (M62.81)     Time: 1540-0867 PT Time Calculation (min) (ACUTE ONLY): 16 min  Charges:  $Therapeutic Activity: 8-22 mins                     Reinaldo Berber, PT, DPT Acute Rehabilitation Services Pager: [phone] Office: [phone]     Reinaldo Berber 09/19/2018, 9:56 AM

## 2018-09-20 LAB — GLUCOSE, CAPILLARY
Glucose-Capillary: 101 mg/dL — ABNORMAL HIGH (ref 70–99)
Glucose-Capillary: 101 mg/dL — ABNORMAL HIGH (ref 70–99)
Glucose-Capillary: 119 mg/dL — ABNORMAL HIGH (ref 70–99)
Glucose-Capillary: 87 mg/dL (ref 70–99)
Glucose-Capillary: 93 mg/dL (ref 70–99)
Glucose-Capillary: 95 mg/dL (ref 70–99)
Glucose-Capillary: 96 mg/dL (ref 70–99)

## 2018-09-20 MED ORDER — ALBUTEROL SULFATE (2.5 MG/3ML) 0.083% IN NEBU
2.5000 mg | INHALATION_SOLUTION | Freq: Once | RESPIRATORY_TRACT | Status: AC | PRN
  Administered 2018-09-20: 2.5 mg via RESPIRATORY_TRACT

## 2018-09-20 MED ORDER — ALBUTEROL SULFATE (2.5 MG/3ML) 0.083% IN NEBU
INHALATION_SOLUTION | RESPIRATORY_TRACT | Status: AC
  Administered 2018-09-20: 2.5 mg via RESPIRATORY_TRACT
  Filled 2018-09-20: qty 3

## 2018-09-20 NOTE — Progress Notes (Signed)
Patient around 1700h was experiencing SOB, restless and saying he couldn't breathe. Gave patient haldol and breathing treatment. Paged the doctor and rapid response RN. Patient sating 97% on 2 liters of O2 and BP stable. Patient now has had a second breathing treatment, and feeling much better. Patient isn't experiencing labored breathing. Will continue to monitor.   Jeremiah Ly RN

## 2018-09-20 NOTE — Progress Notes (Signed)
PROGRESS NOTE    Jeremiah [name]  TSV:[id number] DOB: 04/04/1946 DOA: 08/31/2018 PCP: Selena Lesser, MD   Brief Narrative:  73 year old with past medical history of COPD, obstructive sleep apnea, essential hypertension, PTSD, depression initially presented to any pain hospital on 5/18 with shortness of breath.  Failed NIPPV therefore had to be intubated.  Had significant amount of bronchospasm requiring intubation.  CTA chest showed incidental finding of small left lower lobe PE, CT of the head showed small 5 mm left frontal lobe hemorrhage and MRI confirmed this consistent with hypertension or amyloid angiopathy therefore heparin drip was deferred.  Abnormal EKG showing J-point elevation and cardiology recommended trending her troponin but no further intervention at this point.  Eventually extubated on 5/27.  Lower extremity Dopplers negative for DVT, echocardiogram showed ejection fraction 20-25% suspect stress-induced cardiomyopathy.  Due to agitation he was placed on Precedex and transition to Seroquel with Haldol as needed.  Completed course of steroid and avoiding any further use avoid worsening of delirium.  09/20/2018: No significant changes.  Awaiting skilled nursing facility placement.   Assessment & Plan:   Active Problems:   COPD, frequent exacerbations ([place])   Intracerebral hemorrhage   Pulmonary embolism (HCC)   Acute combined systolic and diastolic heart failure (HCC)   Acute renal failure with acute cortical necrosis (HCC)   Delirium due to another medical condition, acute, hyperactive   Dementia ([place])  Acute delirium; improved Progressive dementia -Mostly remains hypoactive, but of and on tried to pull on tubes and lines..  Patient is calm and following commands. Answers very basic questions.  Off Precedex drip.  Currently on Seroquel, continue to monitor.  Haldol as needed. -Patient does have ongoing progressive dementia per family members. 09/20/2018: No behavioral problems.   Dysphagia Moderate to severe protein cal malnutrition -Dysphagia 3 diet per S&S -Monitor the amount of intake.   Acute on chronic COPD exacerbation, Improved. - Continue bronchodilators.   - Completed course of IV steroids.   - Continue to monitor.  Acute systolic congestive heart failure ejection fraction 25%- stress related, resolved Chronic diastolic congestive heart failure with preserved ejection fraction, 65%.  Appears euvolemic. -This was likely stress related.  Limited echocardiogram 6/2 shows recovery of ejection fraction to 60% with grade 1 diastolic dysfunction  Acute kidney injury secondary to cardiomyopathy; improved.  -This has continued to improve.   Continue to monitor.   Renal panel in am  Small left lower lobe pulmonary embolism -No obvious signs of hemodynamic instability.  Not on anticoagulation due to intracranial hemorrhage  Small left frontal lobe intracranial hemorrhage, due to hypertension versus amyloid angiopathy -No anticoagulation.  Continue to monitor.  No midline shift noted.  Please maintain soft restraints in place due to patient's occasional impulsive behavior with tendency pulling his IV line, getting out of bed, pulling on his feeding tube.     DVT prophylaxis: SCDs Code Status: Full code for family Family Communication:  Disposition Plan: Skilled nursing facility with subacute rehab  Consultants:   Cardiology   critical care  Procedures:   Intubated 5/18, extubated 5/27  Antimicrobials:   Azithromycin 1 day 5/18  Rocephin 5/18-5/22  Cefepime 5/22-5/25  Vancomycin 5/22- 5/25   Subjective:   Objective: Vitals:   09/20/18 0552 09/20/18 0733 09/20/18 0916 09/20/18 0920  BP:   124/75   Pulse:    90  Resp:   18   Temp:    98.4 F (36.9 C)  TempSrc:    Oral  SpO2:  93%  98%  Weight: 69.4 kg     Height:        Intake/Output Summary (Last 24 hours) at 09/20/2018 1346 Last data filed at 09/20/2018 1329 Gross per 24  hour  Intake 420 ml  Output 375 ml  Net 45 ml   Filed Weights   09/18/18 0500 09/19/18 2004 09/20/18 0552  Weight: 66.6 kg 66.7 kg 69.4 kg    Examination: Constitutional: Not in any distress. HEENT: Mild pallor. Neck: normal, supple, no masses, no thyromegaly Respiratory: clear to auscultation  Cardiovascular: S1-S2.   Abdomen: no tenderness, no masses palpated. No hepatosplenomegaly. Bowel sounds positive.  Musculoskeletal: No leg edema. Neurologic: Awake and alert.   Data Reviewed:   CBC: Recent Labs  Lab 09/15/18 1148 09/16/18 0456 09/17/18 0442 09/18/18 0410 09/19/18 0542  WBC 11.8* 11.4* 11.0* 10.1 9.0  HGB 14.4 15.3 14.8 13.5 12.2*  HCT 43.5 46.1 44.7 40.4 37.1*  MCV 91.2 92.9 92.4 91.8 92.3  PLT 352 410* 409* 392 086   Basic Metabolic Panel: Recent Labs  Lab 09/15/18 1148 09/16/18 0456 09/17/18 0442 09/18/18 0410 09/19/18 0542  NA 140 140 138 139 139  K 4.0 4.0 3.9 3.6 3.3*  CL 104 102 104 106 106  CO2 29 25 23 23 24   GLUCOSE 134* 102* 124* 125* 104*  BUN 23 20 24* 22 18  CREATININE 1.35* 1.43* 1.37* 1.41* 1.35*  CALCIUM 9.6 9.8 9.4 9.1 8.9  MG 2.1 2.3 2.2 2.0 2.1   GFR: Estimated Creatinine Clearance: 49.3 mL/min (A) (by C-G formula based on SCr of 1.35 mg/dL (H)). Liver Function Tests: No results for input(s): AST, ALT, ALKPHOS, BILITOT, PROT, ALBUMIN in the last 168 hours. No results for input(s): LIPASE, AMYLASE in the last 168 hours. No results for input(s): AMMONIA in the last 168 hours. Coagulation Profile: No results for input(s): INR, PROTIME in the last 168 hours. Cardiac Enzymes: No results for input(s): CKTOTAL, CKMB, CKMBINDEX, TROPONINI in the last 168 hours. BNP (last 3 results) No results for input(s): PROBNP in the last 8760 hours. HbA1C: No results for input(s): HGBA1C in the last 72 hours. CBG: Recent Labs  Lab 09/19/18 2004 09/20/18 0005 09/20/18 0412 09/20/18 0710 09/20/18 1131  GLUCAP 96 101* 93 87 119*    Lipid Profile: No results for input(s): CHOL, HDL, LDLCALC, TRIG, CHOLHDL, LDLDIRECT in the last 72 hours. Thyroid Function Tests: No results for input(s): TSH, T4TOTAL, FREET4, T3FREE, THYROIDAB in the last 72 hours. Anemia Panel: No results for input(s): VITAMINB12, FOLATE, FERRITIN, TIBC, IRON, RETICCTPCT in the last 72 hours. Sepsis Labs: No results for input(s): PROCALCITON, LATICACIDVEN in the last 168 hours.  No results found for this or any previous visit (from the past 240 hour(s)).   Radiology Studies: No results found.  Scheduled Meds: . arformoterol  15 mcg Nebulization BID  . aspirin  81 mg Per Tube Daily  . budesonide (PULMICORT) nebulizer solution  0.5 mg Nebulization BID  . Chlorhexidine Gluconate Cloth  6 each Topical Daily  . famotidine  20 mg Per Tube QHS  . feeding supplement (ENSURE ENLIVE)  237 mL Oral BID BM  . furosemide  20 mg Per Tube Daily  . insulin aspart  0-5 Units Subcutaneous Q4H  . ipratropium  0.5 mg Nebulization BID  . isosorbide-hydrALAZINE  1 tablet Per Tube TID  . mouth rinse  15 mL Mouth Rinse q12n4p  . multivitamin with minerals  1 tablet Oral Daily  . pantoprazole sodium  40 mg Per Tube Daily  . pravastatin  10 mg Per Tube q1800  . QUEtiapine  50 mg Per Tube BID  . sodium chloride flush  10-40 mL Intracatheter Q12H   Continuous Infusions: . sodium chloride 10 mL/hr at 09/14/18 2300  . dextrose 5 % and 0.45% NaCl 50 mL/hr at 09/19/18 2243     LOS: 20 days   Time spent= 25 mins    Bonnell Public, MD [facility]  If 7PM-7AM, please contact night-coverage [email or web address] 09/20/2018, 1:46 PM

## 2018-09-20 NOTE — TOC Progression Note (Signed)
Transition of Care [facility]) - Progression Note    Patient Details  Name: Jeremiah [name] MRN: [id number] Date of Birth: [birth year]  Transition of Care [facility]) CM/SW [place], [place] Phone Number: 09/20/2018, 12:28 PM  Clinical Narrative:     CSW called and spoke with the patient's wife. CSW gave current bed offers. The patient's wife stated that her preferences are [facility], [facility], and [facility]. CSW explained that those choices are still pending and she would be in contact with the patient's wife once those choices became available. The patient's wife is concerned about her husband leaving the hospital and going to another facility. She asked could the patient be re-evaluated for CIR again. CSW stated that she would share that with the doctor to see if it could be done.   CSW will continue to follow and assist with disposition.     Expected Discharge Plan: [place] Barriers to Discharge: No SNF bed  Expected Discharge Plan and Services Expected Discharge Plan: [place]   Discharge Planning Services: CM Consult Post Acute Care Choice: [place] Living arrangements for the past 2 months: Single Family Home                                       Social Determinants of Health (SDOH) Interventions    Readmission Risk Interventions No flowsheet data found.

## 2018-09-20 NOTE — Significant Event (Signed)
Rapid Response Event Note  Overview: Time Called: 4827 Arrival Time: 1905 Event Type: Respiratory  Initial Focused Assessment: Per notes: patient admitted with SOB requiring intubation. (hx COPD).  sm PE and ICH noted on CT scan of chest and head.  Intermittent agitation/delirium.  Per RN  Patient typically becomes agitated about 5 or 6pm. Per RN he has been calm and without complaint of SOB through out the day until about 6pm.  5mg  Haldol given by RN for increased restlessness.  Then he began to complain of SOB. PRN albuterol given.   Interventions: Upon my arrival patient sitting up right in the bed, increased WOB and RR 28.  He feels like "he can't breath".  Lung sounds diminished through out, hint of wheeze. Additional Albuterol given Now with audible wheezes but he states he feels better and improved respiratory status. BP 137/80  HR 110  RR 24  O2 sats 98%  On 2L [place]  Assisted patient to lying on his side, calmly resting in the bed.  Plan of Care (if not transferred): RN to call if patient has additional respiratory distress  Event Summary: Name of Physician Notified: [place] at 80    at          Raliegh Ip

## 2018-09-21 ENCOUNTER — Encounter (HOSPITAL_COMMUNITY): Payer: Self-pay

## 2018-09-21 ENCOUNTER — Inpatient Hospital Stay (HOSPITAL_COMMUNITY)
Admission: RE | Admit: 2018-09-21 | Discharge: 2018-09-30 | DRG: 945 | Disposition: A | Discharge status: Home Health | Payer: Medicare Other | Source: Intra-hospital | Attending: Physical Medicine & Rehabilitation | Admitting: Physical Medicine & Rehabilitation

## 2018-09-21 ENCOUNTER — Other Ambulatory Visit: Payer: Self-pay

## 2018-09-21 DIAGNOSIS — J449 Chronic obstructive pulmonary disease, unspecified: Secondary | ICD-10-CM | POA: Diagnosis present

## 2018-09-21 DIAGNOSIS — I10 Essential (primary) hypertension: Secondary | ICD-10-CM

## 2018-09-21 DIAGNOSIS — Z7951 Long term (current) use of inhaled steroids: Secondary | ICD-10-CM | POA: Diagnosis not present

## 2018-09-21 DIAGNOSIS — F410 Panic disorder [episodic paroxysmal anxiety] without agoraphobia: Secondary | ICD-10-CM | POA: Diagnosis not present

## 2018-09-21 DIAGNOSIS — Z79899 Other long term (current) drug therapy: Secondary | ICD-10-CM | POA: Diagnosis not present

## 2018-09-21 DIAGNOSIS — R5381 Other malaise: Principal | ICD-10-CM | POA: Diagnosis present

## 2018-09-21 DIAGNOSIS — F4310 Post-traumatic stress disorder, unspecified: Secondary | ICD-10-CM | POA: Diagnosis present

## 2018-09-21 DIAGNOSIS — Z86711 Personal history of pulmonary embolism: Secondary | ICD-10-CM

## 2018-09-21 DIAGNOSIS — I610 Nontraumatic intracerebral hemorrhage in hemisphere, subcortical: Secondary | ICD-10-CM | POA: Diagnosis not present

## 2018-09-21 DIAGNOSIS — I5031 Acute diastolic (congestive) heart failure: Secondary | ICD-10-CM | POA: Diagnosis present

## 2018-09-21 DIAGNOSIS — I169 Hypertensive crisis, unspecified: Secondary | ICD-10-CM | POA: Diagnosis present

## 2018-09-21 DIAGNOSIS — I619 Nontraumatic intracerebral hemorrhage, unspecified: Secondary | ICD-10-CM | POA: Diagnosis present

## 2018-09-21 DIAGNOSIS — R4189 Other symptoms and signs involving cognitive functions and awareness: Secondary | ICD-10-CM | POA: Diagnosis present

## 2018-09-21 DIAGNOSIS — Z7982 Long term (current) use of aspirin: Secondary | ICD-10-CM

## 2018-09-21 DIAGNOSIS — Z87891 Personal history of nicotine dependence: Secondary | ICD-10-CM

## 2018-09-21 DIAGNOSIS — I509 Heart failure, unspecified: Secondary | ICD-10-CM | POA: Diagnosis not present

## 2018-09-21 DIAGNOSIS — I611 Nontraumatic intracerebral hemorrhage in hemisphere, cortical: Secondary | ICD-10-CM | POA: Diagnosis not present

## 2018-09-21 DIAGNOSIS — I110 Hypertensive heart disease with heart failure: Secondary | ICD-10-CM | POA: Diagnosis present

## 2018-09-21 DIAGNOSIS — F329 Major depressive disorder, single episode, unspecified: Secondary | ICD-10-CM | POA: Diagnosis present

## 2018-09-21 DIAGNOSIS — I420 Dilated cardiomyopathy: Secondary | ICD-10-CM | POA: Diagnosis present

## 2018-09-21 DIAGNOSIS — I612 Nontraumatic intracerebral hemorrhage in hemisphere, unspecified: Secondary | ICD-10-CM

## 2018-09-21 LAB — RENAL FUNCTION PANEL
Albumin: 2.4 g/dL — ABNORMAL LOW (ref 3.5–5.0)
Anion gap: 6 (ref 5–15)
BUN: 13 mg/dL (ref 8–23)
CO2: 25 mmol/L (ref 22–32)
Calcium: 8.8 mg/dL — ABNORMAL LOW (ref 8.9–10.3)
Chloride: 107 mmol/L (ref 98–111)
Creatinine, Ser: 1.2 mg/dL (ref 0.61–1.24)
GFR calc Af Amer: >60 mL/min (ref >60)
GFR calc non Af Amer: >60 mL/min (ref >60)
Glucose, Bld: 115 mg/dL — ABNORMAL HIGH (ref 70–99)
Phosphorus: 2.6 mg/dL (ref 2.5–4.6)
Potassium: 3.3 mmol/L — ABNORMAL LOW (ref 3.5–5.1)
Sodium: 138 mmol/L (ref 135–145)

## 2018-09-21 LAB — GLUCOSE, CAPILLARY
Glucose-Capillary: 96 mg/dL (ref 70–99)
Glucose-Capillary: 91 mg/dL (ref 70–99)
Glucose-Capillary: 107 mg/dL — ABNORMAL HIGH (ref 70–99)

## 2018-09-21 MED ORDER — ENSURE ENLIVE PO LIQD: 237.0000 mL | Freq: Two times a day (BID) | ORAL | 12 refills | Status: DC

## 2018-09-21 MED ORDER — ADULT MULTIVITAMIN W/MINERALS CH: 1.0000 | ORAL_TABLET | Freq: Every day | ORAL | 0 refills | Status: AC

## 2018-09-21 MED ORDER — FUROSEMIDE 20 MG PO TABS
20.0000 mg | ORAL_TABLET | Freq: Every day | ORAL | Status: DC
  Administered 2018-09-22 – 2018-09-30 (×9): 20 mg via ORAL
  Filled 2018-09-21 (×9): qty 1

## 2018-09-21 MED ORDER — IPRATROPIUM BROMIDE 0.02 % IN SOLN
0.5000 mg | Freq: Two times a day (BID) | RESPIRATORY_TRACT | Status: DC
  Administered 2018-09-21 – 2018-09-23 (×3): 0.5 mg via RESPIRATORY_TRACT
  Filled 2018-09-21 (×6): qty 2.5

## 2018-09-21 MED ORDER — PANTOPRAZOLE SODIUM 40 MG PO PACK
40.0000 mg | PACK | Freq: Every day | ORAL | Status: DC
  Administered 2018-09-22 – 2018-09-25 (×4): 40 mg
  Filled 2018-09-21 (×4): qty 20

## 2018-09-21 MED ORDER — FUROSEMIDE 20 MG PO TABS: 20.0000 mg | ORAL_TABLET | Freq: Every day | ORAL | 0 refills | Status: DC

## 2018-09-21 MED ORDER — FAMOTIDINE 20 MG PO TABS
20.0000 mg | ORAL_TABLET | Freq: Every day | ORAL | Status: DC
  Administered 2018-09-21 – 2018-09-29 (×9): 20 mg
  Filled 2018-09-21 (×9): qty 1

## 2018-09-21 MED ORDER — QUETIAPINE FUMARATE 50 MG PO TABS
50.0000 mg | ORAL_TABLET | Freq: Two times a day (BID) | ORAL | Status: DC
  Administered 2018-09-21 – 2018-09-22 (×2): 50 mg via ORAL
  Filled 2018-09-21 (×2): qty 1

## 2018-09-21 MED ORDER — ISOSORB DINITRATE-HYDRALAZINE 20-37.5 MG PO TABS: 1.0000 | ORAL_TABLET | Freq: Three times a day (TID) | ORAL | 0 refills | Status: DC

## 2018-09-21 MED ORDER — ADULT MULTIVITAMIN W/MINERALS CH
1.0000 | ORAL_TABLET | Freq: Every day | ORAL | Status: DC
  Administered 2018-09-22 – 2018-09-30 (×9): 1 via ORAL
  Filled 2018-09-21 (×9): qty 1

## 2018-09-21 MED ORDER — ALBUTEROL SULFATE (2.5 MG/3ML) 0.083% IN NEBU
2.5000 mg | INHALATION_SOLUTION | RESPIRATORY_TRACT | Status: DC | PRN
  Administered 2018-09-23 – 2018-09-25 (×2): 2.5 mg via RESPIRATORY_TRACT
  Filled 2018-09-21 (×3): qty 3

## 2018-09-21 MED ORDER — ORAL CARE MOUTH RINSE: 15.0000 mL | Freq: Two times a day (BID) | OROMUCOSAL | 0 refills | Status: DC

## 2018-09-21 MED ORDER — TRAZODONE HCL 50 MG PO TABS
50.0000 mg | ORAL_TABLET | Freq: Every evening | ORAL | Status: DC | PRN
  Administered 2018-09-21 – 2018-09-29 (×8): 50 mg via ORAL
  Filled 2018-09-21 (×8): qty 1

## 2018-09-21 MED ORDER — ENSURE ENLIVE PO LIQD
237.0000 mL | Freq: Two times a day (BID) | ORAL | Status: DC
  Administered 2018-09-22 – 2018-09-29 (×14): 237 mL via ORAL

## 2018-09-21 MED ORDER — SORBITOL 70 % SOLN: 30.0000 mL | Freq: Every day | Status: DC | PRN

## 2018-09-21 MED ORDER — ARFORMOTEROL TARTRATE 15 MCG/2ML IN NEBU
15.0000 ug | INHALATION_SOLUTION | Freq: Two times a day (BID) | RESPIRATORY_TRACT | Status: DC
  Administered 2018-09-21 – 2018-09-30 (×18): 15 ug via RESPIRATORY_TRACT
  Filled 2018-09-21 (×20): qty 2

## 2018-09-21 MED ORDER — PRAVASTATIN SODIUM 10 MG PO TABS
10.0000 mg | ORAL_TABLET | Freq: Every day | ORAL | Status: DC
  Administered 2018-09-21 – 2018-09-29 (×9): 10 mg
  Filled 2018-09-21 (×9): qty 1

## 2018-09-21 MED ORDER — QUETIAPINE FUMARATE 50 MG PO TABS: 50.0000 mg | ORAL_TABLET | Freq: Two times a day (BID) | ORAL | 0 refills | Status: DC

## 2018-09-21 MED ORDER — BUDESONIDE 0.5 MG/2ML IN SUSP
0.5000 mg | Freq: Two times a day (BID) | RESPIRATORY_TRACT | Status: DC
  Administered 2018-09-21 – 2018-09-30 (×18): 0.5 mg via RESPIRATORY_TRACT
  Filled 2018-09-21 (×19): qty 2

## 2018-09-21 MED ORDER — ISOSORB DINITRATE-HYDRALAZINE 20-37.5 MG PO TABS
1.0000 | ORAL_TABLET | Freq: Three times a day (TID) | ORAL | Status: DC
  Administered 2018-09-21 – 2018-09-30 (×26): 1 via ORAL
  Filled 2018-09-21 (×29): qty 1

## 2018-09-21 MED ORDER — ASPIRIN 81 MG PO CHEW
81.0000 mg | CHEWABLE_TABLET | Freq: Every day | ORAL | Status: DC
  Administered 2018-09-22 – 2018-09-30 (×9): 81 mg
  Filled 2018-09-21 (×9): qty 1

## 2018-09-21 NOTE — TOC Transition Note (Signed)
Transition of Care [facility]) - CM/SW Discharge Note   Patient Details  Name: Hiren Peplinski MRN: [id number] Date of Birth: 1946/03/31  Transition [facility]) CM/SW Contact:  Benard Halsted, LCSW Phone Number: 09/21/2018, 12:33 PM   Clinical Narrative:    Patient to discharge to CIR. CSW signing off.   Final next level of care: IP Rehab Facility Barriers to Discharge: No Barriers Identified   Patient Goals and CMS Choice Patient states their goals for this hospitalization and ongoing recovery are:: Wife thinks SNF is best for her husband CMS Medicare.gov Compare Post Acute Care list provided to:: Other (Comment Required)(Wife provided with Medicare.gov information) Choice offered to / list presented to : Spouse  Discharge Placement                       Discharge Plan and Services   Discharge Planning Services: CM Consult Post Acute Care Choice: [place]          DME Arranged: N/A DME Agency: NA       HH Arranged: NA HH Agency: NA        Social Determinants of Health (SDOH) Interventions     Readmission Risk Interventions No flowsheet data found.

## 2018-09-21 NOTE — H&P (Signed)
Physical Medicine and Rehabilitation Admission H&P        Chief Complaint  Patient presents with   Respiratory Distress  : HPI: Jeremiah [name] is a 73 year old right-handed male with history of COPD followed by Dr. Chase Caller, sleep apnea, hypertension, PTSD.  Per chart review lives with spouse.  He has brother in law in [place] who is a Engineer, drilling.  Reportedly independent prior to admission.  Presented 08/31/2018 to [facility] with increasing shortness of breath.  Placed on noninvasive motion ventilation and required intubation.  In the ED patient had significant bronchospasm.  CTA of chest with incidental findings of small left lower lobe pulmonary emboli.  CT of the head showed a 5 mm acute subcortical hemorrhage in the left frontal lobe.  EKG showed ST elevation and PR depression.  Troponin 1.9 elevated from 0.7.  CT angiogram of the chest showed a single subsegmental pulmonary embolism to the left lower lobe.  No obvious signs of hemodynamic instability no anticoagulation due to intracranial hemorrhage.  Transferred to [facility].  Intravenous heparin deferred secondary to subcortical hemorrhage.  Echocardiogram with ejection fraction 25% severely reduced systolic function findings consistent with Takatsubo cardiomyopathy.  Lower extremity Dopplers negative for DVT.  Follow-up MRI of the brain 09/02/2018 again showed 5 mm focus of acute hemorrhage left frontal lobe associated with a small subcortical acute early subacute infarction.  Additional punctate foci of acute early subacute infarcts present in the left anterior frontal lobe and left temporal lobe.  MRA with no large vessel occlusion or aneurysm. Cleared for low dose aspirin daily. Patient with intermittent bouts of agitation and restlessness required Precedex.  EEG was negative for seizure.  Required intubation through 09/09/2018 and extubated.  Hospital course AKI with creatinine 1.34-1.53.  Bouts of hypotension  requiring pressors.  Acute systolic congestive heart failure with echocardiogram completed ejection fraction of 76% normal systolic function and remains on low-dose Lasix.[name] Kitchen  His diet has been advanced to mechanical soft.  Bouts of agitation/delirium and restlessness with telemetry monitor for safety and maintained on Seroquel.  He is off Precedex drip.  Therapy evaluations completed and patient was admitted for a comprehensive rehab program.   Review of Systems  Constitutional: Negative for chills and fever.  HENT: Negative for hearing loss.   Eyes: Negative for blurred vision and double vision.  Respiratory: Positive for shortness of breath. Negative for cough.   Cardiovascular: Positive for leg swelling. Negative for chest pain and palpitations.  Gastrointestinal: Positive for constipation. Negative for heartburn, nausea and vomiting.  Genitourinary: Negative for dysuria, flank pain and hematuria.  Musculoskeletal: Positive for joint pain and myalgias.  Skin: Negative for rash.  Neurological: Positive for dizziness.  All other systems reviewed and are negative.   Past Medical History:  Diagnosis Date   COPD (chronic obstructive pulmonary disease) (HCC)     Depression     Dilated cardiomyopathy (HCC)     Hypertension     PTSD (post-traumatic stress disorder)     Sleep apnea           Past Surgical History:  Procedure Laterality Date   HAMMER TOE SURGERY   April 2017         Family History  Problem Relation Age of Onset   Asthma Maternal Grandmother     Heart disease Father     Dementia Mother      Social History:  reports that he quit smoking about 31 years ago. His smoking  use included cigarettes. He has a 30.00 pack-year smoking history. He has never used smokeless tobacco. He reports current alcohol use of about 2.0 standard drinks of alcohol per week. He reports that he does not use drugs. Allergies: No Known Allergies       Medications Prior to Admission    Medication Sig Dispense Refill   albuterol (PROVENTIL HFA;VENTOLIN HFA) 108 (90 Base) MCG/ACT inhaler Inhale 2 puffs into the lungs every 4 (four) hours as needed for wheezing or shortness of breath (((PLAN B))). 1 Inhaler 5   albuterol (PROVENTIL) (2.5 MG/3ML) 0.083% nebulizer solution Take 2.5 mg by nebulization every 6 (six) hours as needed for wheezing or shortness of breath.       ALPRAZolam (XANAX) 0.5 MG tablet TAKE 1 TABLET BY MOUTH TWICE A DAY AS NEEDED FOR ANXIETY (Patient taking differently: Take 0.5 mg by mouth 2 (two) times daily as needed for anxiety. ) 60 tablet 1   amLODipine (NORVASC) 2.5 MG tablet Take 2.5 mg by mouth daily.        aspirin EC 81 MG tablet Take 81 mg by mouth daily.       benzonatate (TESSALON) 100 MG capsule Take 1 capsule (100 mg total) by mouth 3 (three) times daily. (Patient taking differently: Take 100 mg by mouth 3 (three) times daily as needed for cough. ) 45 capsule 5   buPROPion (WELLBUTRIN XL) 150 MG 24 hr tablet Take 150 mg by mouth daily.       dextromethorphan-guaiFENesin (MUCINEX DM) 30-600 MG per 12 hr tablet Take 2 tablets by mouth 2 (two) times daily as needed (cough and congestion).        erythromycin ophthalmic ointment Place 1 application into both eyes at bedtime.        esomeprazole (NEXIUM) 40 MG capsule Take 40 mg by mouth daily before breakfast.    2   famotidine (PEPCID) 20 MG tablet Take 20 mg by mouth at bedtime.       fluticasone (FLONASE) 50 MCG/ACT nasal spray Place 2 sprays into both nostrils 2 (two) times daily.       magnesium oxide (MAG-OX) 400 MG tablet Take 400 mg by mouth 2 (two) times daily. For leg cramps       pravastatin (PRAVACHOL) 10 MG tablet Take 10 mg by mouth daily.       Respiratory Therapy Supplies (FLUTTER) DEVI Use as directed 1 each 0   Respiratory Therapy Supplies (NEBULIZER COMPRESSOR) KIT Use as directed DX: J44.9 1 each 0   sildenafil (VIAGRA) 100 MG tablet Take 100 mg by mouth daily as  needed for erectile dysfunction.       tamsulosin (FLOMAX) 0.4 MG CAPS capsule Take 0.4 mg by mouth daily with supper.       traZODone (DESYREL) 50 MG tablet Take 1 tablet by mouth at bedtime as needed for sleep.        TRELEGY ELLIPTA 100-62.5-25 MCG/INH AEPB TAKE 1 PUFF BY MOUTH EVERY DAY (Patient taking differently: Inhale 1 puff into the lungs daily. ) 180 each 1      Drug Regimen Review Drug regimen was reviewed and remains appropriate with no significant issues identified   Home: Home Living Family/patient expects to be discharged to:: Skilled nursing facility Living Arrangements: Spouse/significant other Additional Comments: Pt unreliable historian at this time   Functional History: Prior Function Level of Independence: Needs assistance Gait / Transfers Assistance Needed: Pt unable to answer questions related to prior functional status  Functional Status:  Mobility: Bed Mobility Overal bed mobility: Needs Assistance Bed Mobility: Supine to Sit, Sit to Supine Supine to sit: Min assist Sit to supine: Min assist General bed mobility comments: min a to come to sitting Transfers Overall transfer level: Needs assistance Equipment used: Rolling walker (2 wheeled) Transfers: Sit to/from Stand Sit to Stand: Mod assist, From elevated surface Stand pivot transfers: Mod assist General transfer comment: min A to stand today, imrpoved. stand pivot to chair with contact guard Ambulation/Gait Ambulation/Gait assistance: Min assist Gait Distance (Feet): 60 Feet Assistive device: Rolling walker (2 wheeled) Gait Pattern/deviations: Step-to pattern General Gait Details: unsteady, poor awareness with RW, mutlimodal cues throughout session for proximity, BOS. espcailyl when turning with RW patient will travel well outsdie of it. fatigues qucikly with DOE 2/4, HR 120, SpO2 95% ON ra Gait velocity: decreased   ADL: ADL Overall ADL's : Needs assistance/impaired Eating/Feeding:  NPO Grooming: Wash/dry face, Set up, Min guard, Sitting, Minimal assistance Grooming Details (indicate cue type and reason): min A for throughness Upper Body Bathing: Maximal assistance, Sitting, Bed level Lower Body Bathing: Total assistance, Bed level Upper Body Dressing : Maximal assistance Lower Body Dressing: Total assistance Toilet Transfer: Moderate assistance, +2 for physical assistance, +2 for safety/equipment Toileting- Clothing Manipulation and Hygiene: Total assistance, Bed level Toileting - Clothing Manipulation Details (indicate cue type and reason): unaware of BM, cleaned up at bedlevel at Pt fatigued and could not complete sit<>stand Functional mobility during ADLs: Moderate assistance, Rolling walker, +2 for safety/equipment General ADL Comments: Pt completed 3x sit<>stands with dynamic tasks incorporated. Mod A for balance. Decreased cognition, activity tolerance, balance deficits and R lateral lean impacting assist level with ADLs.   Cognition: Cognition Overall Cognitive Status: No family/caregiver present to determine baseline cognitive functioning Arousal/Alertness: Awake/alert Orientation Level: Oriented to person, Oriented to place, Disoriented to time, Disoriented to situation Attention: Sustained Sustained Attention: Impaired Sustained Attention Impairment: Functional basic Problem Solving: Impaired Problem Solving Impairment: Functional basic Behaviors: Restless Safety/Judgment: Impaired Cognition Arousal/Alertness: Awake/alert Behavior During Therapy: Flat affect, Anxious, Impulsive(tearful) Overall Cognitive Status: No family/caregiver present to determine baseline cognitive functioning Area of Impairment: Following commands, Problem solving, Safety/judgement, Orientation, Awareness, Attention Orientation Level: Place, Time Current Attention Level: Sustained Following Commands: Follows one step commands consistently, Follows multi-step commands  inconsistently, Follows one step commands with increased time Safety/Judgement: Decreased awareness of safety, Decreased awareness of deficits Awareness: Intellectual(starting to show some emergent awareness) Problem Solving: Slow processing, Difficulty sequencing, Requires verbal cues General Comments: improving cognition, answering questions accurately and following cues.  Difficult to assess due to: Impaired communication   Physical Exam: Blood pressure 115/65, pulse 90, temperature 98.1 F (36.7 C), temperature source Oral, resp. rate 18, height '6\' 3"'$  (1.905 m), weight 69.4 kg, SpO2 98 %. Physical Exam  Constitutional: He appears well-developed and well-nourished. No distress.  HENT:  Head: Normocephalic and atraumatic.  Eyes: Pupils are equal, round, and reactive to light. EOM are normal.  Neck: Normal range of motion. No tracheal deviation present. No thyromegaly present.  Cardiovascular: Normal rate. Exam reveals no friction rub.  No murmur heard. Respiratory: Effort normal. No respiratory distress. He has no wheezes.  GI: Soft. He exhibits no distension. There is no abdominal tenderness.  Neurological:  Patient is alert sitting up in bed eating lunch.  Follows commands.  Provides his name and age.  Improved insight and awareness. Normal language. RUE 4/5. LUE 5/5. RLE: 3/5 HF, KE and 4/5 ADF/PF. LLE: 4/5 HF, KE and 5/5  ADF/PF. No focal sensory deficits  Skin: Skin is warm.  Psychiatric: He has a normal mood and affect. His behavior is normal.      Lab Results Last 48 Hours        Results for orders placed or performed during the hospital encounter of 08/31/18 (from the past 48 hour(s))  Glucose, capillary     Status: Abnormal    Collection Time: 09/19/18  4:09 PM  Result Value Ref Range    Glucose-Capillary 103 (H) 70 - 99 mg/dL  Glucose, capillary     Status: None    Collection Time: 09/19/18  8:04 PM  Result Value Ref Range    Glucose-Capillary 96 70 - 99 mg/dL   Glucose, capillary     Status: Abnormal    Collection Time: 09/20/18 12:05 AM  Result Value Ref Range    Glucose-Capillary 101 (H) 70 - 99 mg/dL  Glucose, capillary     Status: None    Collection Time: 09/20/18  4:12 AM  Result Value Ref Range    Glucose-Capillary 93 70 - 99 mg/dL  Glucose, capillary     Status: None    Collection Time: 09/20/18  7:10 AM  Result Value Ref Range    Glucose-Capillary 87 70 - 99 mg/dL  Glucose, capillary     Status: Abnormal    Collection Time: 09/20/18 11:31 AM  Result Value Ref Range    Glucose-Capillary 119 (H) 70 - 99 mg/dL  Glucose, capillary     Status: Abnormal    Collection Time: 09/20/18  4:08 PM  Result Value Ref Range    Glucose-Capillary 101 (H) 70 - 99 mg/dL  Glucose, capillary     Status: None    Collection Time: 09/20/18  8:26 PM  Result Value Ref Range    Glucose-Capillary 96 70 - 99 mg/dL  Glucose, capillary     Status: None    Collection Time: 09/20/18 11:52 PM  Result Value Ref Range    Glucose-Capillary 95 70 - 99 mg/dL  Renal function panel     Status: Abnormal    Collection Time: 09/21/18  3:44 AM  Result Value Ref Range    Sodium 138 135 - 145 mmol/L    Potassium 3.3 (L) 3.5 - 5.1 mmol/L    Chloride 107 98 - 111 mmol/L    CO2 25 22 - 32 mmol/L    Glucose, Bld 115 (H) 70 - 99 mg/dL    BUN 13 8 - 23 mg/dL    Creatinine, Ser 1.20 0.61 - 1.24 mg/dL    Calcium 8.8 (L) 8.9 - 10.3 mg/dL    Phosphorus 2.6 2.5 - 4.6 mg/dL    Albumin 2.4 (L) 3.5 - 5.0 g/dL    GFR calc non Af Amer >60 >60 mL/min    GFR calc Af Amer >60 >60 mL/min    Anion gap 6 5 - 15      Comment: Performed at [facility], [address] [place], [place] [address]  Glucose, capillary     Status: None    Collection Time: 09/21/18  4:19 AM  Result Value Ref Range    Glucose-Capillary 96 70 - 99 mg/dL  Glucose, capillary     Status: None    Collection Time: 09/21/18  7:16 AM  Result Value Ref Range    Glucose-Capillary 91 70 - 99 mg/dL       Imaging Results (Last 48 hours)  No results found.  Medical Problem List and Plan: 1.  Decreased functional mobility with cognitive deficits secondary to left frontal hemorrhage secondary to hypertensive crisis and multiple medical issues             -admit to inpatient rehab 2.  Antithrombotics: -DVT/anticoagulation: SCDs.  Venous Dopplers negative             -antiplatelet therapy: aspirin  81 mg daily 3. Pain Management: Tylenol as needed 4. Mood: Provide emotional support             -antipsychotic agents: Seroquel 50 mg twice daily-wean as able                         -agitated behavior appears to be much improved.  5. Neuropsych: This patient is not capable of making decisions on his own behalf. 6. Skin/Wound Care: Routine skin checks 7. Fluids/Electrolytes/Nutrition: Routine in and outs with follow-up chemistries 8.  History of COPD.  Continue nebulizers.  Followed by Dr. Chase Caller 9.  Hypertension.Bidil 20-37.5 mg 3 times daily 10.  Acute diastolic congestive heart failure.  Lasix 20 mg daily.  Monitor for any signs of fluid overload             -daily weights 11.  Small left lower lobe pulmonary embolism identified on CT angiogram of the chest 08/31/2018.  No obvious signs of hemodynamic instability.  No plans for anticoagulation.   Post Admission Physician Evaluation: 1. Functional deficits secondary  to left frontal hemorrhage. 2. Patient is admitted to receive collaborative, interdisciplinary care between the physiatrist, rehab nursing staff, and therapy team. 3. Patient's level of medical complexity and substantial therapy needs in context of that medical necessity cannot be provided at a lesser intensity of care such as a SNF. 4. Patient has experienced substantial functional loss from his/her baseline which was documented above under the "Functional History" and "Functional Status" headings.  Judging by the patient's diagnosis, physical exam, and functional  history, the patient has potential for functional progress which will result in measurable gains while on inpatient rehab.  These gains will be of substantial and practical use upon discharge  in facilitating mobility and self-care at the household level. 5. Physiatrist will provide 24 hour management of medical needs as well as oversight of the therapy plan/treatment and provide guidance as appropriate regarding the interaction of the two. 6. The Preadmission Screening has been reviewed and patient status is unchanged unless otherwise stated above. 7. 24 hour rehab nursing will assist with bladder management, bowel management, safety, skin/wound care, disease management, medication administration, pain management and patient education  and help integrate therapy concepts, techniques,education, etc. 8. PT will assess and treat for/with: Lower extremity strength, range of motion, stamina, balance, functional mobility, safety, adaptive techniques and equipment.   Goals are: mod I to supervision. 9. OT will assess and treat for/with: ADL's, functional mobility, safety, upper extremity strength, adaptive techniques and equipment, NMR.   Goals are: mod I to supervision. Therapy may proceed with showering this patient. 10. SLP will assess and treat for/with: cognition, communication.  Goals are: mod I to supervision. 11. Case Management and Social Worker will assess and treat for psychological issues and discharge planning. 12. Team conference will be held weekly to assess progress toward goals and to determine barriers to discharge. 13. Patient will receive at least 3 hours of therapy per day at least 5 days per week. 14. ELOS: 10-14 days  15. Prognosis:  excellent   I have personally performed a face to face diagnostic evaluation of this patient and formulated the key components of the plan.  Additionally, I have personally reviewed laboratory data, imaging studies, as well as relevant notes and  concur with the physician assistant's documentation above.  Meredith Staggers, MD, Mellody Drown   Lavon Paganini Jewell, PA-C 09/21/2018

## 2018-09-21 NOTE — H&P (Signed)
Physical Medicine and Rehabilitation Admission H&P    Chief Complaint  Patient presents with   Respiratory Distress  : HPI: Jeremiah [name] is a 73 year old right-handed male with history of COPD followed by Dr. Chase Caller, sleep apnea, hypertension, PTSD.  Per chart review lives with spouse.  He has brother in law in [place] who is a Engineer, drilling.  Reportedly independent prior to admission.  Presented 08/31/2018 to [facility] with increasing shortness of breath.  Placed on noninvasive motion ventilation and required intubation.  In the ED patient had significant bronchospasm.  CTA of chest with incidental findings of small left lower lobe pulmonary emboli.  CT of the head showed a 5 mm acute subcortical hemorrhage in the left frontal lobe.  EKG showed ST elevation and PR depression.  Troponin 1.9 elevated from 0.7.  CT angiogram of the chest showed a single subsegmental pulmonary embolism to the left lower lobe.  No obvious signs of hemodynamic instability no anticoagulation due to intracranial hemorrhage.  Transferred to [facility].  Intravenous heparin deferred secondary to subcortical hemorrhage.  Echocardiogram with ejection fraction 25% severely reduced systolic function findings consistent with Takatsubo cardiomyopathy.  Lower extremity Dopplers negative for DVT.  Follow-up MRI of the brain 09/02/2018 again showed 5 mm focus of acute hemorrhage left frontal lobe associated with a small subcortical acute early subacute infarction.  Additional punctate foci of acute early subacute infarcts present in the left anterior frontal lobe and left temporal lobe.  MRA with no large vessel occlusion or aneurysm. Cleared for low dose aspirin daily. Patient with intermittent bouts of agitation and restlessness required Precedex.  EEG was negative for seizure.  Required intubation through 09/09/2018 and extubated.  Hospital course AKI with creatinine 1.34-1.53.  Bouts of hypotension requiring  pressors.  Acute systolic congestive heart failure with echocardiogram completed ejection fraction of 86% normal systolic function and remains on low-dose Lasix.[name] Kitchen  His diet has been advanced to mechanical soft.  Bouts of agitation/delirium and restlessness with telemetry monitor for safety and maintained on Seroquel.  He is off Precedex drip.  Therapy evaluations completed and patient was admitted for a comprehensive rehab program.  Review of Systems  Constitutional: Negative for chills and fever.  HENT: Negative for hearing loss.   Eyes: Negative for blurred vision and double vision.  Respiratory: Positive for shortness of breath. Negative for cough.   Cardiovascular: Positive for leg swelling. Negative for chest pain and palpitations.  Gastrointestinal: Positive for constipation. Negative for heartburn, nausea and vomiting.  Genitourinary: Negative for dysuria, flank pain and hematuria.  Musculoskeletal: Positive for joint pain and myalgias.  Skin: Negative for rash.  Neurological: Positive for dizziness.  All other systems reviewed and are negative.  Past Medical History:  Diagnosis Date   COPD (chronic obstructive pulmonary disease) (HCC)    Depression    Dilated cardiomyopathy (HCC)    Hypertension    PTSD (post-traumatic stress disorder)    Sleep apnea    Past Surgical History:  Procedure Laterality Date   HAMMER TOE SURGERY  April 2017   Family History  Problem Relation Age of Onset   Asthma Maternal Grandmother    Heart disease Father    Dementia Mother    Social History:  reports that he quit smoking about 31 years ago. His smoking use included cigarettes. He has a 30.00 pack-year smoking history. He has never used smokeless tobacco. He reports current alcohol use of about 2.0 standard drinks of alcohol per week. He  reports that he does not use drugs. Allergies: No Known Allergies Medications Prior to Admission  Medication Sig Dispense Refill   albuterol  (PROVENTIL HFA;VENTOLIN HFA) 108 (90 Base) MCG/ACT inhaler Inhale 2 puffs into the lungs every 4 (four) hours as needed for wheezing or shortness of breath (((PLAN B))). 1 Inhaler 5   albuterol (PROVENTIL) (2.5 MG/3ML) 0.083% nebulizer solution Take 2.5 mg by nebulization every 6 (six) hours as needed for wheezing or shortness of breath.     ALPRAZolam (XANAX) 0.5 MG tablet TAKE 1 TABLET BY MOUTH TWICE A DAY AS NEEDED FOR ANXIETY (Patient taking differently: Take 0.5 mg by mouth 2 (two) times daily as needed for anxiety. ) 60 tablet 1   amLODipine (NORVASC) 2.5 MG tablet Take 2.5 mg by mouth daily.      aspirin EC 81 MG tablet Take 81 mg by mouth daily.     benzonatate (TESSALON) 100 MG capsule Take 1 capsule (100 mg total) by mouth 3 (three) times daily. (Patient taking differently: Take 100 mg by mouth 3 (three) times daily as needed for cough. ) 45 capsule 5   buPROPion (WELLBUTRIN XL) 150 MG 24 hr tablet Take 150 mg by mouth daily.     dextromethorphan-guaiFENesin (MUCINEX DM) 30-600 MG per 12 hr tablet Take 2 tablets by mouth 2 (two) times daily as needed (cough and congestion).      erythromycin ophthalmic ointment Place 1 application into both eyes at bedtime.      esomeprazole (NEXIUM) 40 MG capsule Take 40 mg by mouth daily before breakfast.   2   famotidine (PEPCID) 20 MG tablet Take 20 mg by mouth at bedtime.     fluticasone (FLONASE) 50 MCG/ACT nasal spray Place 2 sprays into both nostrils 2 (two) times daily.     magnesium oxide (MAG-OX) 400 MG tablet Take 400 mg by mouth 2 (two) times daily. For leg cramps     pravastatin (PRAVACHOL) 10 MG tablet Take 10 mg by mouth daily.     Respiratory Therapy Supplies (FLUTTER) DEVI Use as directed 1 each 0   Respiratory Therapy Supplies (NEBULIZER COMPRESSOR) KIT Use as directed DX: J44.9 1 each 0   sildenafil (VIAGRA) 100 MG tablet Take 100 mg by mouth daily as needed for erectile dysfunction.     tamsulosin (FLOMAX) 0.4 MG CAPS  capsule Take 0.4 mg by mouth daily with supper.     traZODone (DESYREL) 50 MG tablet Take 1 tablet by mouth at bedtime as needed for sleep.      TRELEGY ELLIPTA 100-62.5-25 MCG/INH AEPB TAKE 1 PUFF BY MOUTH EVERY DAY (Patient taking differently: Inhale 1 puff into the lungs daily. ) 180 each 1    Drug Regimen Review Drug regimen was reviewed and remains appropriate with no significant issues identified  Home: Home Living Family/patient expects to be discharged to:: Skilled nursing facility Living Arrangements: Spouse/significant other Additional Comments: Pt unreliable historian at this time   Functional History: Prior Function Level of Independence: Needs assistance Gait / Transfers Assistance Needed: Pt unable to answer questions related to prior functional status  Functional Status:  Mobility: Bed Mobility Overal bed mobility: Needs Assistance Bed Mobility: Supine to Sit, Sit to Supine Supine to sit: Min assist Sit to supine: Min assist General bed mobility comments: min a to come to sitting Transfers Overall transfer level: Needs assistance Equipment used: Rolling walker (2 wheeled) Transfers: Sit to/from Stand Sit to Stand: Mod assist, From elevated surface Stand pivot transfers: Mod assist  General transfer comment: min A to stand today, imrpoved. stand pivot to chair with contact guard Ambulation/Gait Ambulation/Gait assistance: Min assist Gait Distance (Feet): 60 Feet Assistive device: Rolling walker (2 wheeled) Gait Pattern/deviations: Step-to pattern General Gait Details: unsteady, poor awareness with RW, mutlimodal cues throughout session for proximity, BOS. espcailyl when turning with RW patient will travel well outsdie of it. fatigues qucikly with DOE 2/4, HR 120, SpO2 95% ON ra Gait velocity: decreased    ADL: ADL Overall ADL's : Needs assistance/impaired Eating/Feeding: NPO Grooming: Wash/dry face, Set up, Min guard, Sitting, Minimal assistance Grooming  Details (indicate cue type and reason): min A for throughness Upper Body Bathing: Maximal assistance, Sitting, Bed level Lower Body Bathing: Total assistance, Bed level Upper Body Dressing : Maximal assistance Lower Body Dressing: Total assistance Toilet Transfer: Moderate assistance, +2 for physical assistance, +2 for safety/equipment Toileting- Clothing Manipulation and Hygiene: Total assistance, Bed level Toileting - Clothing Manipulation Details (indicate cue type and reason): unaware of BM, cleaned up at bedlevel at Pt fatigued and could not complete sit<>stand Functional mobility during ADLs: Moderate assistance, Rolling walker, +2 for safety/equipment General ADL Comments: Pt completed 3x sit<>stands with dynamic tasks incorporated. Mod A for balance. Decreased cognition, activity tolerance, balance deficits and R lateral lean impacting assist level with ADLs.  Cognition: Cognition Overall Cognitive Status: No family/caregiver present to determine baseline cognitive functioning Arousal/Alertness: Awake/alert Orientation Level: Oriented to person, Oriented to place, Disoriented to time, Disoriented to situation Attention: Sustained Sustained Attention: Impaired Sustained Attention Impairment: Functional basic Problem Solving: Impaired Problem Solving Impairment: Functional basic Behaviors: Restless Safety/Judgment: Impaired Cognition Arousal/Alertness: Awake/alert Behavior During Therapy: Flat affect, Anxious, Impulsive(tearful) Overall Cognitive Status: No family/caregiver present to determine baseline cognitive functioning Area of Impairment: Following commands, Problem solving, Safety/judgement, Orientation, Awareness, Attention Orientation Level: Place, Time Current Attention Level: Sustained Following Commands: Follows one step commands consistently, Follows multi-step commands inconsistently, Follows one step commands with increased time Safety/Judgement: Decreased  awareness of safety, Decreased awareness of deficits Awareness: Intellectual(starting to show some emergent awareness) Problem Solving: Slow processing, Difficulty sequencing, Requires verbal cues General Comments: improving cognition, answering questions accurately and following cues.  Difficult to assess due to: Impaired communication  Physical Exam: Blood pressure 115/65, pulse 90, temperature 98.1 F (36.7 C), temperature source Oral, resp. rate 18, height _0  (1.905 m), weight 69.4 kg, SpO2 98 %. Physical Exam  Constitutional: He appears well-developed and well-nourished. No distress.  HENT:  Head: Normocephalic and atraumatic.  Eyes: Pupils are equal, round, and reactive to light. EOM are normal.  Neck: Normal range of motion. No tracheal deviation present. No thyromegaly present.  Cardiovascular: Normal rate. Exam reveals no friction rub.  No murmur heard. Respiratory: Effort normal. No respiratory distress. He has no wheezes.  GI: Soft. He exhibits no distension. There is no abdominal tenderness.  Neurological:  Patient is alert sitting up in bed eating lunch.  Follows commands.  Provides his name and age.  Improved insight and awareness. Normal language. RUE 4/5. LUE 5/5. RLE: 3/5 HF, KE and 4/5 ADF/PF. LLE: 4/5 HF, KE and 5/5 ADF/PF. No focal sensory deficits  Skin: Skin is warm.  Psychiatric: He has a normal mood and affect. His behavior is normal.    Results for orders placed or performed during the hospital encounter of 08/31/18 (from the past 48 hour(s))  Glucose, capillary     Status: Abnormal   Collection Time: 09/19/18  4:09 PM  Result Value Ref Range   Glucose-Capillary 103 (  H) 70 - 99 mg/dL  Glucose, capillary     Status: None   Collection Time: 09/19/18  8:04 PM  Result Value Ref Range   Glucose-Capillary 96 70 - 99 mg/dL  Glucose, capillary     Status: Abnormal   Collection Time: 09/20/18 12:05 AM  Result Value Ref Range   Glucose-Capillary 101 (H) 70 - 99  mg/dL  Glucose, capillary     Status: None   Collection Time: 09/20/18  4:12 AM  Result Value Ref Range   Glucose-Capillary 93 70 - 99 mg/dL  Glucose, capillary     Status: None   Collection Time: 09/20/18  7:10 AM  Result Value Ref Range   Glucose-Capillary 87 70 - 99 mg/dL  Glucose, capillary     Status: Abnormal   Collection Time: 09/20/18 11:31 AM  Result Value Ref Range   Glucose-Capillary 119 (H) 70 - 99 mg/dL  Glucose, capillary     Status: Abnormal   Collection Time: 09/20/18  4:08 PM  Result Value Ref Range   Glucose-Capillary 101 (H) 70 - 99 mg/dL  Glucose, capillary     Status: None   Collection Time: 09/20/18  8:26 PM  Result Value Ref Range   Glucose-Capillary 96 70 - 99 mg/dL  Glucose, capillary     Status: None   Collection Time: 09/20/18 11:52 PM  Result Value Ref Range   Glucose-Capillary 95 70 - 99 mg/dL  Renal function panel     Status: Abnormal   Collection Time: 09/21/18  3:44 AM  Result Value Ref Range   Sodium 138 135 - 145 mmol/L   Potassium 3.3 (L) 3.5 - 5.1 mmol/L   Chloride 107 98 - 111 mmol/L   CO2 25 22 - 32 mmol/L   Glucose, Bld 115 (H) 70 - 99 mg/dL   BUN 13 8 - 23 mg/dL   Creatinine, Ser 1.20 0.61 - 1.24 mg/dL   Calcium 8.8 (L) 8.9 - 10.3 mg/dL   Phosphorus 2.6 2.5 - 4.6 mg/dL   Albumin 2.4 (L) 3.5 - 5.0 g/dL   GFR calc non Af Amer >60 >60 mL/min   GFR calc Af Amer >60 >60 mL/min   Anion gap 6 5 - 15    Comment: Performed at [facility], [place] [address], [place], [place] [address]  Glucose, capillary     Status: None   Collection Time: 09/21/18  4:19 AM  Result Value Ref Range   Glucose-Capillary 96 70 - 99 mg/dL  Glucose, capillary     Status: None   Collection Time: 09/21/18  7:16 AM  Result Value Ref Range   Glucose-Capillary 91 70 - 99 mg/dL   No results found.     Medical Problem List and Plan: 1.  Decreased functional mobility with cognitive deficits secondary to left frontal hemorrhage secondary to hypertensive  crisis and multiple medical issues  -admit to inpatient rehab 2.  Antithrombotics: -DVT/anticoagulation: SCDs.  Venous Dopplers negative  -antiplatelet therapy: aspirin  81 mg daily 3. Pain Management: Tylenol as needed 4. Mood: Provide emotional support  -antipsychotic agents: Seroquel 50 mg twice daily-wean as able   -agitated behavior appears to be much improved.  5. Neuropsych: This patient is not capable of making decisions on his own behalf. 6. Skin/Wound Care: Routine skin checks 7. Fluids/Electrolytes/Nutrition: Routine in and outs with follow-up chemistries 8.  History of COPD.  Continue nebulizers.  Followed by Dr. Chase Caller 9.  Hypertension.Bidil 20-37.5 mg 3 times daily 10.  Acute diastolic congestive heart failure.  Lasix 20 mg daily.  Monitor for any signs of fluid overload  -daily weights 11.  Small left lower lobe pulmonary embolism identified on CT angiogram of the chest 08/31/2018.  No obvious signs of hemodynamic instability.  No plans for anticoagulation.    Lavon Paganini Angiulli, PA-C 09/21/2018

## 2018-09-21 NOTE — Progress Notes (Signed)
Cristina Gong, RN  Rehab Admission Coordinator  Physical Medicine and Rehabilitation  PMR Pre-admission  Addendum  Date of Service:  09/21/2018 12:56 PM       Related encounter: ED to Hosp-Admission (Discharged) from 08/31/2018 in [facility]         Show:Clear all [x] Manual[x] Template[x] Copied  Added by: [x] Cristina Gong, RN  [] Hover for details PMR Admission Coordinator Pre-Admission Assessment  Patient: Jeremiah [name] is an 73 y.o., male MRN: [id number] DOB: 06-22-1945 Height: 6\' 3"  (190.5 cm) Weight: 69.4 kg                                                                                                                                                  Insurance Information HMO:     PPO:      PCP:      IPA:      80/20:      OTHER: no HMO PRIMARY: Medicare a and b      Policy#: [id number]      Subscriber: pt Benefits:  Phone #: passport one online     Name: 6/8 Eff. Date: a 01/13/2013 b 10/14/2014     Deduct: $1408      Out of Pocket Max: none      Life Max: none CIR: 100%      SNF: 20 full days Outpatient: 80%     Co-Pay: 20% Home Health: 100%      Co-Pay: none DME: 80%     Co-Pay: 20% Providers: pt choice  SECONDARY: [employer]      Policy#: [id number]      Subscriber: pt  Medicaid Application Date:       Case Manager:  Disability Application Date:       Case Worker:   The "Data Collection Information Summary" for patients in Inpatient Rehabilitation Facilities with attached "Privacy Act [place] Records" was provided and verbally reviewed with: Family  Emergency Contact Information         Contact Information    Name Relation Home Work Mobile   Brake,[name] Spouse [phone]  [phone]   Dr. Wynetta Emery, Ray Other [phone]       Current Medical History  Patient Admitting Diagnosis: ICH  History of Present Illness:73 year old right-handed male with history of COPD followed by Dr. Chase Caller, sleep apnea,  hypertension, PTSD. Presented 08/31/2018 to [facility] with increasing shortness of breath. Placed on noninvasive motion ventilation and required intubation. In the ED patient had significant bronchospasm. CTA of chest with incidental findings of small left lower lobe pulmonary emboli. CT of the head showed a 5 mm acute subcortical hemorrhage in the left frontal lobe. EKG showed ST elevation and PR depression. Troponin 1.9 elevated from 0.7. CT angiogram of the chest showed a single subsegmental pulmonary embolism to the left lower lobe. No obvious signs of  hemodynamic instability no anticoagulation due to intracranial hemorrhage. Transferred to [facility]. Intravenous heparin deferred secondary to subcortical hemorrhage. Echocardiogram with ejection fraction 25% severely reduced systolic function findings consistent with Takatsubocardiomyopathy. Lower extremity Dopplers negative for DVT. Follow-up MRI of the brain 09/02/2018 again showed 5 mm focus of acute hemorrhage left frontal lobe associated with a small subcortical acute early subacute infarction. Additional punctate foci of acute early subacute infarcts present in the left anterior frontal lobe and left temporal lobe. MRA with no large vessel occlusion or aneurysm. Cleared for low dose aspirin daily.Patient with intermittent bouts of agitation and restlessness required Precedex. EEG was negative for seizure. Required intubation through 09/09/2018 and extubated. Hospital course AKI with creatinine 1.34-1.53. Bouts of hypotension requiring pressors. Acute systolic congestive heart failure with echocardiogram completed ejection fraction of 69% normal systolic function and remains on low-dose Lasix.[name] Kitchen His diet has been advanced to mechanical soft. Bouts of agitation/delirium and restlessness with tele sitter monitor for safety and maintained on Seroquel.    Past Medical History      Past Medical History:  Diagnosis  Date  . COPD (chronic obstructive pulmonary disease) ([place])   . Depression   . Dilated cardiomyopathy ([place])   . Hypertension   . PTSD (post-traumatic stress disorder)   . Sleep apnea     Family History  family history includes Asthma in his maternal grandmother; Dementia in his mother; Heart disease in his father.  Prior Rehab/Hospitalizations:  Has the patient had prior rehab or hospitalizations prior to admission? Yes  Has the patient had major surgery during 100 days prior to admission? No  Current Medications   Current Facility-Administered Medications:  .  0.9 %  sodium chloride infusion, , Intravenous, PRN, Kipp Brood, MD, Last Rate: 10 mL/hr at 09/14/18 2300 .  albuterol (PROVENTIL) (2.5 MG/3ML) 0.083% nebulizer solution 2.5 mg, 2.5 mg, Nebulization, Q3H PRN, Kipp Brood, MD, 2.5 mg at 09/21/18 0019 .  arformoterol (BROVANA) nebulizer solution 15 mcg, 15 mcg, Nebulization, BID, Agarwala, Ravi, MD, 15 mcg at 09/21/18 0748 .  aspirin chewable tablet 81 mg, 81 mg, Per Tube, Daily, Agarwala, Ravi, MD, 81 mg at 09/21/18 0812 .  budesonide (PULMICORT) nebulizer solution 0.5 mg, 0.5 mg, Nebulization, BID, Agarwala, Ravi, MD, 0.5 mg at 09/21/18 0751 .  Chlorhexidine Gluconate Cloth 2 % PADS 6 each, 6 each, Topical, Daily, Kipp Brood, MD, 6 each at 09/21/18 0328 .  dextrose 5 %-0.45 % sodium chloride infusion, , Intravenous, Continuous, Amin, Ankit Chirag, MD, Last Rate: 50 mL/hr at 09/21/18 1219 .  famotidine (PEPCID) tablet 20 mg, 20 mg, Per Tube, QHS, Agarwala, Ravi, MD, 20 mg at 09/20/18 2128 .  feeding supplement (ENSURE ENLIVE) (ENSURE ENLIVE) liquid 237 mL, 237 mL, Oral, BID BM, Amin, Ankit Chirag, MD, 237 mL at 09/21/18 1121 .  furosemide (LASIX) tablet 20 mg, 20 mg, Per Tube, Daily, Agarwala, Ravi, MD, 20 mg at 09/21/18 6295 .  haloperidol lactate (HALDOL) injection 5 mg, 5 mg, Intravenous, Q6H PRN, Kipp Brood, MD, 5 mg at 09/20/18 1807 .  insulin  aspart (novoLOG) injection 0-5 Units, 0-5 Units, Subcutaneous, Q4H, Agarwala, Ravi, MD, 1 Units at 09/12/18 0422 .  ipratropium (ATROVENT) nebulizer solution 0.5 mg, 0.5 mg, Nebulization, BID, Agarwala, Ravi, MD, 0.5 mg at 09/21/18 0748 .  isosorbide-hydrALAZINE (BIDIL) 20-37.5 MG per tablet 1 tablet, 1 tablet, Per Tube, TID, Kipp Brood, MD, 1 tablet at 09/21/18 0812 .  MEDLINE mouth rinse, 15 mL, Mouth Rinse, q12n4p, Kipp Brood, MD,  15 mL at 09/21/18 1121 .  multivitamin with minerals tablet 1 tablet, 1 tablet, Oral, Daily, Amin, Ankit Chirag, MD, 1 tablet at 09/21/18 [phone] .  pantoprazole sodium (PROTONIX) 40 mg/20 mL oral suspension 40 mg, 40 mg, Per Tube, Daily, Agarwala, Ravi, MD, 40 mg at 09/21/18 0811 .  pravastatin (PRAVACHOL) tablet 10 mg, 10 mg, Per Tube, q1800, Kipp Brood, MD, 10 mg at 09/20/18 1603 .  QUEtiapine (SEROQUEL) tablet 50 mg, 50 mg, Per Tube, BID, Agarwala, Ravi, MD, 50 mg at 09/21/18 0813 .  sodium chloride flush (NS) 0.9 % injection 10-40 mL, 10-40 mL, Intracatheter, Q12H, Agarwala, Ravi, MD, 10 mL at 09/18/18 2204 .  sodium chloride flush (NS) 0.9 % injection 10-40 mL, 10-40 mL, Intracatheter, PRN, Kipp Brood, MD, 10 mL at 09/15/18 1806 .  traZODone (DESYREL) tablet 50 mg, 50 mg, Oral, QHS PRN, Kipp Brood, MD, 50 mg at 09/20/18 2329  Patients Current Diet:     Diet Order                  Diet - low sodium heart healthy         DIET DYS 3 Room service appropriate? Yes; Fluid consistency: Thin  Diet effective now               Precautions / Restrictions Precautions Precautions: Fall Precaution Comments: pushes to the right (mild), decreased safety awareness + impulsive at times Restrictions Weight Bearing Restrictions: No   Has the patient had 2 or more falls or a fall with injury in the past year?No  Prior Activity Level Community (5-7x/wk): Independent without AD  Prior Functional Level Prior Function Level of  Independence: Independent Gait / Transfers Assistance Needed: Pt unable to answer questions related to prior functional status Comments: I verified PLOF with wife  Self Care: Did the patient need help bathing, dressing, using the toilet or eating?  Independent  Indoor Mobility: Did the patient need assistance with walking from room to room (with or without device)? Independent  Stairs: Did the patient need assistance with internal or external stairs (with or without device)? Independent  Functional Cognition: Did the patient need help planning regular tasks such as shopping or remembering to take medications? Independent  Home Assistive Devices / Equipment Home Assistive Devices/Equipment: None  Prior Device Use: Indicate devices/aids used by the patient prior to current illness, exacerbation or injury? None of the above  Current Functional Level Cognition  Arousal/Alertness: Awake/alert Overall Cognitive Status: No family/caregiver present to determine baseline cognitive functioning Difficult to assess due to: Impaired communication Current Attention Level: Sustained Orientation Level: Oriented to person, Oriented to place, Disoriented to time, Disoriented to situation Following Commands: Follows one step commands consistently, Follows multi-step commands inconsistently, Follows one step commands with increased time Safety/Judgement: Decreased awareness of safety, Decreased awareness of deficits General Comments: improving cognition, answering questions accurately and following cues. Able to recall activities during session with one reminder needed.  Attention: Sustained Sustained Attention: Impaired Sustained Attention Impairment: Functional basic Problem Solving: Impaired Problem Solving Impairment: Functional basic Behaviors: Restless Safety/Judgment: Impaired    Extremity Assessment (includes Sensation/Coordination)  Upper Extremity Assessment: RUE deficits/detail  RUE Deficits / Details: delay with movement, grossly 4-/5 RUE Sensation: decreased proprioception RUE Coordination: decreased fine motor, decreased gross motor LUE Deficits / Details: generalized weakness  Lower Extremity Assessment: Defer to PT evaluation RLE Deficits / Details: grossly 3-/5 LLE Deficits / Details: grossly 3-/5    ADLs  Overall ADL's : Needs assistance/impaired  Eating/Feeding: NPO Grooming: Set up, Sitting, Oral care Grooming Details (indicate cue type and reason): able to complete however reminder to bring bowl towards face Upper Body Bathing: Maximal assistance, Sitting, Bed level Lower Body Bathing: Total assistance, Bed level Upper Body Dressing : Maximal assistance Lower Body Dressing: Minimal assistance Lower Body Dressing Details (indicate cue type and reason): Pt educated to bring foot to knee in figure 4 sitting to don/doff sock. Toilet Transfer: Moderate assistance, +2 for physical assistance, +2 for safety/equipment Toileting- Clothing Manipulation and Hygiene: Total assistance, Bed level Toileting - Clothing Manipulation Details (indicate cue type and reason): unaware of BM, cleaned up at bedlevel at Pt fatigued and could not complete sit<>stand Functional mobility during ADLs: Moderate assistance, Rolling walker General ADL Comments: Pt demonstrated sit to stand transition required MOd A for safety. Demonstrated some instabiltiy with RW leaning towards R side.    Mobility  Overal bed mobility: Needs Assistance Bed Mobility: Supine to Sit, Sit to Supine Supine to sit: Independent Sit to supine: Independent General bed mobility comments: min a to come to sitting    Transfers  Overall transfer level: Needs assistance Equipment used: Rolling walker (2 wheeled) Transfers: Sit to/from Stand Sit to Stand: Mod assist, From elevated surface Stand pivot transfers: Mod assist General transfer comment: min A to stand today, imrpoved. stand pivot to chair  with contact guard    Ambulation / Gait / Stairs / Wheelchair Mobility  Ambulation/Gait Ambulation/Gait assistance: Herbalist (Feet): 60 Feet Assistive device: Rolling walker (2 wheeled) Gait Pattern/deviations: Step-to pattern General Gait Details: unsteady, poor awareness with RW, mutlimodal cues throughout session for proximity, BOS. espcailyl when turning with RW patient will travel well outsdie of it. fatigues qucikly with DOE 2/4, HR 120, SpO2 95% ON ra Gait velocity: decreased    Posture / Balance Dynamic Sitting Balance Sitting balance - Comments: min guard to mod A. Pt at times leaning significantly out of base of support and needing cues to scoot back to a more stable position EOB. Sat close to 15 minutes. Balance Overall balance assessment: Needs assistance Sitting-balance support: Bilateral upper extremity supported, Feet supported Sitting balance-Leahy Scale: Poor Sitting balance - Comments: min guard to mod A. Pt at times leaning significantly out of base of support and needing cues to scoot back to a more stable position EOB. Sat close to 15 minutes. Postural control: Right lateral lean Standing balance support: Bilateral upper extremity supported Standing balance-Leahy Scale: Poor Standing balance comment: walker and at times mod A in standing (dynamic)    Special needs/care consideration BiPAP/CPAP n/a CPM n/a Continuous Drip IV n/a Dialysis n/a Life Vest n/a Oxygen at 2 liters [facility] Special Bed low bed with floor pads due to frequent falls with decreased safety awareness; wife reports patient likes to kneel and pray multiple times pta and he may be inclined to just get out of bed and do so; patient reports praying when found down in hospital Trach Size n/a Wound Vac n/a Skin MASD buttocks bilaterally Bowel mgmt: LBM 6/6 incontinent Bladder mgmt: external catheter Diabetic mgmt Hgb A1c 5.6 Behavioral consideration  tele sitter for decreased safety  awareness and falls in hospital  Chemo/radiation  N/a   Previous Home Environment  Living Arrangements: Spouse/significant other  Lives With: Spouse Available Help at Discharge: Family, Available 24 hours/day(wife and stepdaughter, Elmo Putt and daughter) Type of Home: House Home Layout: One level Home Access: Stairs to enter Entrance Stairs-Rails: None Entrance Stairs-Number of Steps: 2 Bathroom Shower/Tub:  Tub/shower unit, Multimedia programmer: Standard Home Care Services: No Additional Comments: Wife state patietn had some mild memory issues pta  Discharge Living Setting Plans for Discharge Living Setting: Patient's home, Lives with (comment)(wife) Type of Home at Discharge: House Discharge Home Layout: One level Discharge Home Access: Stairs to enter Entrance Stairs-Rails: None Entrance Stairs-Number of Steps: 2 Discharge Bathroom Shower/Tub: Tub/shower unit, Horticulturist, commercial: Standard Discharge Bathroom Accessibility: Yes How Accessible: Accessible via walker Does the patient have any problems obtaining your medications?: No  Social/Family/Support Systems Patient Roles: Spouse, Parent(retired Tour manager, 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 Ezekiel Slocumb Anticipated Caregiver: wife , step daughter and daughter Anticipated Caregiver's Contact Information: see above Ability/Limitations of Caregiver: wife has RA but is mobile without AD Caregiver Availability: 70/2(OVZC daughter, Raina Mina here form [employer] and working remotely) Discharge Plan Discussed with Primary Caregiver: Yes Is Caregiver In Agreement with Plan?: Yes Does Caregiver/Family have Issues with Lodging/Transportation while Pt is in Rehab?: No  Goals/Additional Needs Patient/Family Goal for Rehab: Mod I to superivsion with PT, OT, and SLP Expected length of stay: ELOS 10 to 14 days Equipment Needs: telesitter due to decreased  safety awareness and mutiple fall during this admission Special Service Needs: telesitter Pt/Family Agrees to Admission and willing to participate: Yes Program Orientation Provided & Reviewed with Pt/Caregiver Including Roles  & Responsibilities: Yes  Decrease burden of Care through IP rehab admission: n/a  Possible need for SNF placement upon discharge: not anticipated  Patient Condition: This patient's medical and functional status has changed since the consult dated: 09/14/2018 in which the Rehabilitation Physician determined and documented that the patient's condition is potentially appropriate for intensive rehabilitative care in an inpatient rehabilitation facility. See "History of Present Illness" (above) for medical update. Functional changes are: overall min to mod assist. Patient's medical and functional status update has been discussed with the Rehabilitation physician and patient appropriate for inpatient rehabilitation. Will admit to inpatient rehab today.  Preadmission Screen Completed By:  Cleatrice Burke, RN RN MSN, 09/21/2018 12:56 PM ______________________________________________________________________   Discussed status with Dr. Naaman Plummer on 09/21/2018 at 1307 and received approval for admission today.  Admission Coordinator:  Cleatrice Burke RN MSN, time 1308 Date 09/21/2018     Cosigned by: Meredith Staggers, MD at 09/21/2018 1:11 PM  Revision History

## 2018-09-21 NOTE — Discharge Summary (Signed)
Physician Discharge Summary  Patient ID: Jeremiah [name] MRN: [id number] DOB/AGE: Nov 09, 1945 73 y.o.  Admit date: 08/31/2018 Discharge date: 09/21/2018  Admission Diagnoses:  Discharge Diagnoses:  Active Problems: Acute on chronic COPD with exacerbation.     Intracerebral hemorrhage   Pulmonary embolism (HCC)   Acute combined systolic and diastolic heart failure (HCC)   Acute renal failure with acute cortical necrosis (HCC)   Delirium due to another medical condition, acute, hyperactive   Dementia [facility])   Discharged Condition: stable  Hospital Course:  Patient is a 73 year old African-American male with past medical history of COPD, obstructive sleep apnea, essential hypertension, PTSD, depression initially presented to [facility] on 08/31/2018 with shortness of breath.    Patient failed NIPPV, therefore, had to be intubated.    Patient had significant amount of bronchospasm requiring intubation.  CTA chest showed incidental finding of small left lower lobe PE, CT of the head showed small 5 mm left frontal lobe hemorrhage and MRI confirmed this consistent with hypertension or amyloid angiopathy.  EKG revealed J-point elevation and cardiology recommended trending her troponin but no further intervention at this point.  Patient was eventually extubated on 09/09/2018.  Lower extremity Dopplers were negative for DVT, echocardiogram showed ejection fraction 20-25%, suspect stress-induced cardiomyopathy.  Due to agitation he was placed on Precedex and transition to Seroquel with Haldol as needed.  Patient has completed course of steroid.  Patient was initially assessed by the rehab team and was thought not to be an ideal candidate. Based on above, SNF facility was recommended, but patient has improved significantly over the last several days.  Patient was reassessed by the rehab team today, and patient has been accepted to the inpatient rehab at [facility].  Acute delirium;  improved Progressive dementia -Mostly remains hypoactive, but of and on tried to pull on tubes and lines..  Patient is calm and following commands. Answers very basic questions.  Off Precedex drip.  Currently on Seroquel, continue to monitor.  Haldol as needed. -Patient does have ongoing progressive dementia per family members. 09/20/2018: No behavioral problems.  Dysphagia Moderate to severe protein cal malnutrition -Dysphagia 3 diet per S&S -Monitor the amount of intake.  -Crush medications in apple sauce -Aspiration precautions.  Acute on chronic COPD exacerbation, Improved. - Resolved significantly. - Completed course of IV steroids -Discharge on home regimen. - Continue to monitor.  Acute systolic congestive heart failure ejection fraction 25%- stress related, resolved Chronic diastolic congestive heart failure with preserved ejection fraction, 65%.  Appears euvolemic. -This was likely stress related.  Limited echocardiogram 09/15/2018 showed recovery of ejection fraction to 60% with grade 1 diastolic dysfunction  Acute kidney injury secondary to cardiomyopathy; improved.  -Resolved  Small left lower lobe pulmonary embolism -No obvious signs of hemodynamic instability.   -Not on anticoagulation due to intracranial hemorrhage  Small left frontal lobe intracranial hemorrhage, due to hypertension versus amyloid angiopathy -No anticoagulation.  Continue to monitor.  No midline shift noted.   Consults: cardiology, neurology and Physical medicine and rehab  Significant Diagnostic Studies:   Discharge Exam: Blood pressure 115/65, pulse 90, temperature 98.1 F (36.7 C), temperature source Oral, resp. rate 18, height '6\' 3"'$  (1.905 m), weight 69.4 kg, SpO2 98 %.  Disposition: Discharge disposition: 70-Another Health Care Institution Not Defined  Discharge Instructions    Diet - low sodium heart healthy   Complete by:  As directed    Increase activity slowly   Complete by:   As directed  Allergies as of 09/21/2018   No Known Allergies     Medication List    STOP taking these medications   ALPRAZolam 0.5 MG tablet Commonly known as:  XANAX   amLODipine 2.5 MG tablet Commonly known as:  NORVASC   benzonatate 100 MG capsule Commonly known as:  TESSALON   buPROPion 150 MG 24 hr tablet Commonly known as:  WELLBUTRIN XL   dextromethorphan-guaiFENesin 30-600 MG 12hr tablet Commonly known as:  MUCINEX DM   erythromycin ophthalmic ointment   fluticasone 50 MCG/ACT nasal spray Commonly known as:  FLONASE   magnesium oxide 400 MG tablet Commonly known as:  MAG-OX   sildenafil 100 MG tablet Commonly known as:  VIAGRA   tamsulosin 0.4 MG Caps capsule Commonly known as:  FLOMAX     TAKE these medications   albuterol (2.5 MG/3ML) 0.083% nebulizer solution Commonly known as:  PROVENTIL Take 2.5 mg by nebulization every 6 (six) hours as needed for wheezing or shortness of breath.   albuterol 108 (90 Base) MCG/ACT inhaler Commonly known as:  VENTOLIN HFA Inhale 2 puffs into the lungs every 4 (four) hours as needed for wheezing or shortness of breath (((PLAN B))).   aspirin EC 81 MG tablet Take 81 mg by mouth daily.   esomeprazole 40 MG capsule Commonly known as:  NEXIUM Take 40 mg by mouth daily before breakfast.   famotidine 20 MG tablet Commonly known as:  PEPCID Take 20 mg by mouth at bedtime.   feeding supplement (ENSURE ENLIVE) Liqd Take 237 mLs by mouth 2 (two) times daily between meals.   Flutter Devi Use as directed   Nebulizer Compressor Kit Use as directed DX: J44.9   furosemide 20 MG tablet Commonly known as:  LASIX Place 1 tablet (20 mg total) into feeding tube daily. Start taking on:  September 22, 2018   isosorbide-hydrALAZINE 20-37.5 MG tablet Commonly known as:  BIDIL Place 1 tablet into feeding tube 3 (three) times daily.   mouth rinse Liqd solution 15 mLs by Mouth Rinse route 2 times daily at 12 noon and 4 pm.    multivitamin with minerals Tabs tablet Take 1 tablet by mouth daily. Start taking on:  September 22, 2018   pravastatin 10 MG tablet Commonly known as:  PRAVACHOL Take 10 mg by mouth daily.   QUEtiapine 50 MG tablet Commonly known as:  SEROQUEL Place 1 tablet (50 mg total) into feeding tube 2 (two) times daily.   traZODone 50 MG tablet Commonly known as:  DESYREL Take 1 tablet by mouth at bedtime as needed for sleep.   Trelegy Ellipta 100-62.5-25 MCG/INH Aepb Generic drug:  Fluticasone-Umeclidin-Vilant TAKE 1 PUFF BY MOUTH EVERY DAY What changed:  See the new instructions.        SignedBonnell Public 09/21/2018, 12:24 PM

## 2018-09-21 NOTE — PMR Pre-admission (Addendum)
PMR Admission Coordinator Pre-Admission Assessment  Patient: Jeremiah [name] is an 73 y.o., male MRN: [id number] DOB: 27-May-1945 Height: 6\' 3"  (190.5 cm) Weight: 69.4 kg              Insurance Information HMO:     PPO:      PCP:      IPA:      80/20:      OTHER: no HMO PRIMARY: Medicare a and b      Policy#: [id number]      Subscriber: pt Benefits:  Phone #: passport one online     Name: 6/8 Eff. Date: a 01/13/2013 b 10/14/2014     Deduct: $1408      Out of Pocket Max: none      Life Max: none CIR: 100%      SNF: 20 full days Outpatient: 80%     Co-Pay: 20% Home Health: 100%      Co-Pay: none DME: 80%     Co-Pay: 20% Providers: pt choice  SECONDARY: [employer]      Policy#: [id number]      Subscriber: pt  Medicaid Application Date:       Case Manager:  Disability Application Date:       Case Worker:   The "Data Collection Information Summary" for patients in Inpatient Rehabilitation Facilities with attached "Privacy Act [place] Records" was provided and verbally reviewed with: Family  Emergency Contact Information Contact Information    Name Relation Home Work Mobile   Hauschild,[name] Spouse [phone]  [phone]   Dr. Wynetta Emery, Ray Other [phone]       Current Medical History  Patient Admitting Diagnosis: ICH  History of Present Illness: 73 year old right-handed male with history of COPD followed by Dr. Chase Caller, sleep apnea, hypertension, PTSD.  Presented 08/31/2018 to [facility] with increasing shortness of breath.  Placed on noninvasive motion ventilation and required intubation.  In the ED patient had significant bronchospasm.  CTA of chest with incidental findings of small left lower lobe pulmonary emboli.  CT of the head showed a 5 mm acute subcortical hemorrhage in the left frontal lobe.  EKG showed ST elevation and PR depression.  Troponin 1.9 elevated from 0.7.  CT angiogram of the chest showed a single subsegmental pulmonary embolism to the left lower  lobe.  No obvious signs of hemodynamic instability no anticoagulation due to intracranial hemorrhage.  Transferred to [facility].  Intravenous heparin deferred secondary to subcortical hemorrhage.  Echocardiogram with ejection fraction 25% severely reduced systolic function findings consistent with Takatsubo cardiomyopathy.  Lower extremity Dopplers negative for DVT.  Follow-up MRI of the brain 09/02/2018 again showed 5 mm focus of acute hemorrhage left frontal lobe associated with a small subcortical acute early subacute infarction.  Additional punctate foci of acute early subacute infarcts present in the left anterior frontal lobe and left temporal lobe.  MRA with no large vessel occlusion or aneurysm. Cleared for low dose aspirin daily. Patient with intermittent bouts of agitation and restlessness required Precedex.  EEG was negative for seizure.  Required intubation through 09/09/2018 and extubated.  Hospital course AKI with creatinine 1.34-1.53.  Bouts of hypotension requiring pressors.  Acute systolic congestive heart failure with echocardiogram completed ejection fraction of 01% normal systolic function and remains on low-dose Lasix.[name] Kitchen  His diet has been advanced to mechanical soft.  Bouts of agitation/delirium and restlessness with tele sitter monitor for safety and maintained on Seroquel.     Past Medical History  Past Medical  History:  Diagnosis Date  . COPD (chronic obstructive pulmonary disease) ([place])   . Depression   . Dilated cardiomyopathy ([place])   . Hypertension   . PTSD (post-traumatic stress disorder)   . Sleep apnea     Family History  family history includes Asthma in his maternal grandmother; Dementia in his mother; Heart disease in his father.  Prior Rehab/Hospitalizations:  Has the patient had prior rehab or hospitalizations prior to admission? Yes  Has the patient had major surgery during 100 days prior to admission? No  Current Medications   Current  Facility-Administered Medications:  .  0.9 %  sodium chloride infusion, , Intravenous, PRN, Kipp Brood, MD, Last Rate: 10 mL/hr at 09/14/18 2300 .  albuterol (PROVENTIL) (2.5 MG/3ML) 0.083% nebulizer solution 2.5 mg, 2.5 mg, Nebulization, Q3H PRN, Kipp Brood, MD, 2.5 mg at 09/21/18 0019 .  arformoterol (BROVANA) nebulizer solution 15 mcg, 15 mcg, Nebulization, BID, Agarwala, Ravi, MD, 15 mcg at 09/21/18 0748 .  aspirin chewable tablet 81 mg, 81 mg, Per Tube, Daily, Agarwala, Ravi, MD, 81 mg at 09/21/18 0812 .  budesonide (PULMICORT) nebulizer solution 0.5 mg, 0.5 mg, Nebulization, BID, Agarwala, Ravi, MD, 0.5 mg at 09/21/18 0751 .  Chlorhexidine Gluconate Cloth 2 % PADS 6 each, 6 each, Topical, Daily, Kipp Brood, MD, 6 each at 09/21/18 0328 .  dextrose 5 %-0.45 % sodium chloride infusion, , Intravenous, Continuous, Amin, Ankit Chirag, MD, Last Rate: 50 mL/hr at 09/21/18 1219 .  famotidine (PEPCID) tablet 20 mg, 20 mg, Per Tube, QHS, Agarwala, Ravi, MD, 20 mg at 09/20/18 2128 .  feeding supplement (ENSURE ENLIVE) (ENSURE ENLIVE) liquid 237 mL, 237 mL, Oral, BID BM, Amin, Ankit Chirag, MD, 237 mL at 09/21/18 1121 .  furosemide (LASIX) tablet 20 mg, 20 mg, Per Tube, Daily, Agarwala, Ravi, MD, 20 mg at 09/21/18 4854 .  haloperidol lactate (HALDOL) injection 5 mg, 5 mg, Intravenous, Q6H PRN, Kipp Brood, MD, 5 mg at 09/20/18 1807 .  insulin aspart (novoLOG) injection 0-5 Units, 0-5 Units, Subcutaneous, Q4H, Agarwala, Ravi, MD, 1 Units at 09/12/18 0422 .  ipratropium (ATROVENT) nebulizer solution 0.5 mg, 0.5 mg, Nebulization, BID, Agarwala, Ravi, MD, 0.5 mg at 09/21/18 0748 .  isosorbide-hydrALAZINE (BIDIL) 20-37.5 MG per tablet 1 tablet, 1 tablet, Per Tube, TID, Kipp Brood, MD, 1 tablet at 09/21/18 0812 .  MEDLINE mouth rinse, 15 mL, Mouth Rinse, q12n4p, Agarwala, Ravi, MD, 15 mL at 09/21/18 1121 .  multivitamin with minerals tablet 1 tablet, 1 tablet, Oral, Daily, Amin, Ankit Chirag,  MD, 1 tablet at 09/21/18 [phone] .  pantoprazole sodium (PROTONIX) 40 mg/20 mL oral suspension 40 mg, 40 mg, Per Tube, Daily, Agarwala, Ravi, MD, 40 mg at 09/21/18 0811 .  pravastatin (PRAVACHOL) tablet 10 mg, 10 mg, Per Tube, q1800, Kipp Brood, MD, 10 mg at 09/20/18 1603 .  QUEtiapine (SEROQUEL) tablet 50 mg, 50 mg, Per Tube, BID, Agarwala, Ravi, MD, 50 mg at 09/21/18 0813 .  sodium chloride flush (NS) 0.9 % injection 10-40 mL, 10-40 mL, Intracatheter, Q12H, Agarwala, Ravi, MD, 10 mL at 09/18/18 2204 .  sodium chloride flush (NS) 0.9 % injection 10-40 mL, 10-40 mL, Intracatheter, PRN, Kipp Brood, MD, 10 mL at 09/15/18 1806 .  traZODone (DESYREL) tablet 50 mg, 50 mg, Oral, QHS PRN, Kipp Brood, MD, 50 mg at 09/20/18 2329  Patients Current Diet:  Diet Order            Diet - low sodium heart healthy  DIET DYS 3 Room service appropriate? Yes; Fluid consistency: Thin  Diet effective now              Precautions / Restrictions Precautions Precautions: Fall Precaution Comments: pushes to the right (mild), decreased safety awareness + impulsive at times Restrictions Weight Bearing Restrictions: No   Has the patient had 2 or more falls or a fall with injury in the past year?No  Prior Activity Level Community (5-7x/wk): Independent without AD  Prior Functional Level Prior Function Level of Independence: Independent Gait / Transfers Assistance Needed: Pt unable to answer questions related to prior functional status Comments: I verified PLOF with wife  Self Care: Did the patient need help bathing, dressing, using the toilet or eating?  Independent  Indoor Mobility: Did the patient need assistance with walking from room to room (with or without device)? Independent  Stairs: Did the patient need assistance with internal or external stairs (with or without device)? Independent  Functional Cognition: Did the patient need help planning regular tasks such as shopping or  remembering to take medications? Independent  Home Assistive Devices / Equipment Home Assistive Devices/Equipment: None  Prior Device Use: Indicate devices/aids used by the patient prior to current illness, exacerbation or injury? None of the above  Current Functional Level Cognition  Arousal/Alertness: Awake/alert Overall Cognitive Status: No family/caregiver present to determine baseline cognitive functioning Difficult to assess due to: Impaired communication Current Attention Level: Sustained Orientation Level: Oriented to person, Oriented to place, Disoriented to time, Disoriented to situation Following Commands: Follows one step commands consistently, Follows multi-step commands inconsistently, Follows one step commands with increased time Safety/Judgement: Decreased awareness of safety, Decreased awareness of deficits General Comments: improving cognition, answering questions accurately and following cues. Able to recall activities during session with one reminder needed.  Attention: Sustained Sustained Attention: Impaired Sustained Attention Impairment: Functional basic Problem Solving: Impaired Problem Solving Impairment: Functional basic Behaviors: Restless Safety/Judgment: Impaired    Extremity Assessment (includes Sensation/Coordination)  Upper Extremity Assessment: RUE deficits/detail RUE Deficits / Details: delay with movement, grossly 4-/5 RUE Sensation: decreased proprioception RUE Coordination: decreased fine motor, decreased gross motor LUE Deficits / Details: generalized weakness  Lower Extremity Assessment: Defer to PT evaluation RLE Deficits / Details: grossly 3-/5 LLE Deficits / Details: grossly 3-/5    ADLs  Overall ADL's : Needs assistance/impaired Eating/Feeding: NPO Grooming: Set up, Sitting, Oral care Grooming Details (indicate cue type and reason): able to complete however reminder to bring bowl towards face Upper Body Bathing: Maximal assistance,  Sitting, Bed level Lower Body Bathing: Total assistance, Bed level Upper Body Dressing : Maximal assistance Lower Body Dressing: Minimal assistance Lower Body Dressing Details (indicate cue type and reason): Pt educated to bring foot to knee in figure 4 sitting to don/doff sock. Toilet Transfer: Moderate assistance, +2 for physical assistance, +2 for safety/equipment Toileting- Clothing Manipulation and Hygiene: Total assistance, Bed level Toileting - Clothing Manipulation Details (indicate cue type and reason): unaware of BM, cleaned up at bedlevel at Pt fatigued and could not complete sit<>stand Functional mobility during ADLs: Moderate assistance, Rolling walker General ADL Comments: Pt demonstrated sit to stand transition required MOd A for safety. Demonstrated some instabiltiy with RW leaning towards R side.    Mobility  Overal bed mobility: Needs Assistance Bed Mobility: Supine to Sit, Sit to Supine Supine to sit: Independent Sit to supine: Independent General bed mobility comments: min a to come to sitting    Transfers  Overall transfer level: Needs assistance Equipment used: Rolling  walker (2 wheeled) Transfers: Sit to/from Stand Sit to Stand: Mod assist, From elevated surface Stand pivot transfers: Mod assist General transfer comment: min A to stand today, imrpoved. stand pivot to chair with contact guard    Ambulation / Gait / Stairs / Wheelchair Mobility  Ambulation/Gait Ambulation/Gait assistance: Herbalist (Feet): 60 Feet Assistive device: Rolling walker (2 wheeled) Gait Pattern/deviations: Step-to pattern General Gait Details: unsteady, poor awareness with RW, mutlimodal cues throughout session for proximity, BOS. espcailyl when turning with RW patient will travel well outsdie of it. fatigues qucikly with DOE 2/4, HR 120, SpO2 95% ON ra Gait velocity: decreased    Posture / Balance Dynamic Sitting Balance Sitting balance - Comments: min guard to mod A.  Pt at times leaning significantly out of base of support and needing cues to scoot back to a more stable position EOB. Sat close to 15 minutes. Balance Overall balance assessment: Needs assistance Sitting-balance support: Bilateral upper extremity supported, Feet supported Sitting balance-Leahy Scale: Poor Sitting balance - Comments: min guard to mod A. Pt at times leaning significantly out of base of support and needing cues to scoot back to a more stable position EOB. Sat close to 15 minutes. Postural control: Right lateral lean Standing balance support: Bilateral upper extremity supported Standing balance-Leahy Scale: Poor Standing balance comment: walker and at times mod A in standing (dynamic)    Special needs/care consideration BiPAP/CPAP n/a CPM n/a Continuous Drip IV n/a Dialysis n/a Life Vest n/a Oxygen at 2 liters [place] Special Bed low bed with floor pads due to frequent falls with decreased safety awareness; wife reports patient likes to kneel and pray multiple times pta and he may be inclined to just get out of bed and do so; patient reports praying when found down in hospital Trach Size n/a Wound Vac n/a Skin MASD buttocks bilaterally Bowel mgmt: LBM 6/6 incontinent Bladder mgmt: external catheter Diabetic mgmt Hgb A1c 5.6 Behavioral consideration  tele sitter for decreased safety awareness and falls in hospital  Chemo/radiation  N/a   Previous Home Environment  Living Arrangements: Spouse/significant other  Lives With: Spouse Available Help at Discharge: Family, Available 24 hours/day(wife and stepdaughter, Elmo Putt and daughter) Type of Home: House Home Layout: One level Home Access: Stairs to enter Entrance Stairs-Rails: None Technical brewer of Steps: 2 Bathroom Shower/Tub: Public librarian, Multimedia programmer: Standard Home Care Services: No Additional Comments: Wife state patietn had some mild memory issues pta  Discharge Living Setting Plans  for Discharge Living Setting: Patient's home, Lives with (comment)(wife) Type of Home at Discharge: House Discharge Home Layout: One level Discharge Home Access: Stairs to enter Entrance Stairs-Rails: None Entrance Stairs-Number of Steps: 2 Discharge Bathroom Shower/Tub: Tub/shower unit, Horticulturist, commercial: Standard Discharge Bathroom Accessibility: Yes How Accessible: Accessible via walker Does the patient have any problems obtaining your medications?: No  Social/Family/Support Systems Patient Roles: Spouse, Parent(retired Tour manager, 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 Ezekiel Slocumb Anticipated Caregiver: wife , step daughter and daughter Anticipated Caregiver's Contact Information: see above Ability/Limitations of Caregiver: wife has RA but is mobile without AD Caregiver Availability: 14/9(FWYO daughter, Raina Mina here form [place] and working remotely) Discharge Plan Discussed with Primary Caregiver: Yes Is Caregiver In Agreement with Plan?: Yes Does Caregiver/Family have Issues with Lodging/Transportation while Pt is in Rehab?: No  Goals/Additional Needs Patient/Family Goal for Rehab: Mod I to superivsion with PT, OT, and SLP Expected length of stay: ELOS 10  to 14 days Equipment Needs: telesitter due to decreased safety awareness and mutiple fall during this admission Special Service Needs: telesitter Pt/Family Agrees to Admission and willing to participate: Yes Program Orientation Provided & Reviewed with Pt/Caregiver Including Roles  & Responsibilities: Yes  Decrease burden of Care through IP rehab admission: n/a  Possible need for SNF placement upon discharge: not anticipated  Patient Condition: This patient's medical and functional status has changed since the consult dated: 09/14/2018 in which the Rehabilitation Physician determined and documented that the patient's condition is potentially appropriate for  intensive rehabilitative care in an inpatient rehabilitation facility. See "History of Present Illness" (above) for medical update. Functional changes are: overall min to mod assist. Patient's medical and functional status update has been discussed with the Rehabilitation physician and patient appropriate for inpatient rehabilitation. Will admit to inpatient rehab today.  Preadmission Screen Completed By:  Cleatrice Burke, RN RN MSN, 09/21/2018 12:56 PM ______________________________________________________________________   Discussed status with Dr. Naaman Plummer on 09/21/2018 at 1307 and received approval for admission today.  Admission Coordinator:  Cleatrice Burke RN MSN, time 1308 Date 09/21/2018

## 2018-09-21 NOTE — Progress Notes (Signed)
Patient arrived to the unit at 1538. Pt is alert and oriented x 3, disoriented to situation. Pt pleasant and in no apparent distress at this time. VSS.

## 2018-09-21 NOTE — Progress Notes (Signed)
Occupational Therapy Treatment Patient Details Name: Jeremiah [name] MRN: [id number] DOB: Aug 25, 1945 Today's Date: 09/21/2018    History of present illness Pt adm to [facility] with respiratory failure due to copd exacerbation. Pt also found to have small LLL PE and small 53mm left frontal lobe hemorrhage. Transferred to [facility]. Pt intubated 5/18-5/27. Pt also with confusion. PMH - COPD, OSA, HTN, PTSD, Depression   OT comments  Pt progressing toward OT goals. Demonstrated Independent bed mobility. Set up for oral care while seated at EOB. Mod A in sit to stand transtion with RW and Min A LB dressing for safety in seated position. Pt will benefit from continued skilled OT CIR to maximize independence in ADLs for a safe transition to home setting.OT will continue to follow acutely.    Follow Up Recommendations  CIR    Equipment Recommendations  (defer to next venue of care)    Recommendations for Other Services      Precautions / Restrictions Precautions Precautions: Fall Restrictions Weight Bearing Restrictions: No       Mobility Bed Mobility Overal bed mobility: Needs Assistance Bed Mobility: Supine to Sit;Sit to Supine     Supine to sit: Independent Sit to supine: Independent      Transfers Overall transfer level: Needs assistance Equipment used: Rolling walker (2 wheeled) Transfers: Sit to/from Stand Sit to Stand: Mod assist;From elevated surface Stand pivot transfers: Mod assist       General transfer comment: min A to stand today, imrpoved. stand pivot to chair with contact guard    Balance Overall balance assessment: Needs assistance Sitting-balance support: Bilateral upper extremity supported;Feet supported Sitting balance-Leahy Scale: Poor   Postural control: Right lateral lean Standing balance support: Bilateral upper extremity supported Standing balance-Leahy Scale: Poor Standing balance comment: walker and at times mod A in standing (dynamic)                            ADL either performed or assessed with clinical judgement   ADL Overall ADL's : Needs assistance/impaired Eating/Feeding: NPO   Grooming: Set up;Sitting;Oral care Grooming Details (indicate cue type and reason): able to complete however reminder to bring bowl towards face             Lower Body Dressing: Minimal assistance Lower Body Dressing Details (indicate cue type and reason): Pt educated to bring foot to knee in figure 4 sitting to don/doff sock.             Functional mobility during ADLs: Moderate assistance;Rolling walker General ADL Comments: Pt demonstrated sit to stand transition required MOd A for safety. Demonstrated some instabiltiy with RW leaning towards R side.     Vision       Perception     Praxis      Cognition Arousal/Alertness: Awake/alert Behavior During Therapy: Flat affect;Anxious;Impulsive(tearful) Overall Cognitive Status: No family/caregiver present to determine baseline cognitive functioning Area of Impairment: Following commands;Problem solving;Safety/judgement;Orientation;Awareness;Attention                 Orientation Level: Place;Time Current Attention Level: Sustained   Following Commands: Follows one step commands consistently;Follows multi-step commands inconsistently;Follows one step commands with increased time Safety/Judgement: Decreased awareness of safety;Decreased awareness of deficits Awareness: Intellectual(starting to show some emergent awareness) Problem Solving: Slow processing;Difficulty sequencing;Requires verbal cues General Comments: improving cognition, answering questions accurately and following cues. Able to recall activities during session with one reminder needed.  Exercises     Shoulder Instructions       General Comments oxygen required sitting at EOB 92%, Supine in bed 96%    Pertinent Vitals/ Pain       Pain Assessment: No/denies pain  Home Living                                           Prior Functioning/Environment              Frequency  Min 3X/week        Progress Toward Goals  OT Goals(current goals can now be found in the care plan section)  Progress towards OT goals: Progressing toward goals  Acute Rehab OT Goals Patient Stated Goal: not stated OT Goal Formulation: Patient unable to participate in goal setting ADL Goals Pt Will Perform Grooming: with min guard assist;sitting Pt Will Transfer to Toilet: with min assist;stand pivot transfer;bedside commode Pt Will Perform Toileting - Clothing Manipulation and hygiene: with mod assist;sitting/lateral leans;sit to/from stand Additional ADL Goal #1: Pt will perform seated balance at EOB for 15 min at supervision level while performing ADL Additional ADL Goal #2: Pt will perform bed mobility at min guard level prior to engaging in ADL  Plan Discharge plan needs to be updated;Frequency needs to be updated    Co-evaluation    PT/OT/SLP Co-Evaluation/Treatment: Yes            AM-PAC OT "6 Clicks" Daily Activity     Outcome Measure   Help from another person eating meals?: Total(NPO) Help from another person taking care of personal grooming?: A Lot Help from another person toileting, which includes using toliet, bedpan, or urinal?: A Little Help from another person bathing (including washing, rinsing, drying)?: A Lot Help from another person to put on and taking off regular upper body clothing?: A Lot Help from another person to put on and taking off regular lower body clothing?: A Little 6 Click Score: 13    End of Session Equipment Utilized During Treatment: Gait belt;Rolling walker  OT Visit Diagnosis: Unsteadiness on feet (R26.81);Other abnormalities of gait and mobility (R26.89);Muscle weakness (generalized) (M62.81);Other symptoms and signs involving cognitive function;Other symptoms and signs involving the nervous system (R29.898);Hemiplegia and  hemiparesis Hemiplegia - Right/Left: Right Hemiplegia - dominant/non-dominant: Dominant Hemiplegia - caused by: Nontraumatic intracerebral hemorrhage   Activity Tolerance Patient tolerated treatment well;Patient limited by fatigue   Patient Left in bed;with call bell/phone within reach;with bed alarm set;Other (comment)(soft mitten restraints reapplied)   Nurse Communication Mobility status;Other (comment)(cognition)        Time: 5621-3086 OT Time Calculation (min): 35 min  Charges: OT General Charges $OT Visit: 1 Visit OT Treatments $Self Care/Home Management : 23-37 mins  Minus Breeding, MSOT, OTR/L  Supplemental Rehabilitation Services  [phone]    Marius Ditch 09/21/2018, 12:39 PM

## 2018-09-21 NOTE — Progress Notes (Signed)
  Speech Language Pathology Treatment: Dysphagia  Patient Details Name: Jeremiah [name] MRN: [id number] DOB: 1946/04/02 Today's Date: 09/21/2018 Time: 9507-2257 SLP Time Calculation (min) (ACUTE ONLY): 10 min  Assessment / Plan / Recommendation Clinical Impression  Pt's affect and alertness similar to baseline per previous notes. On Friday he was not as interactive with SLP co-worker and was pocketing meats. After verbal cues to reposition upright, he orally manipulated Dys 3 texture timely and efficiently. No indications of aspiration with solid or straw sip soda. Scheduled to discharge soon. Recommend continued ST to upgrade to regular if/when appropriate.    HPI HPI: 73 year old male admitted 08/31/2018 to [facility] with SOB/ AECOPD, required intubation. Found to have small LLL PE and 40mm left frontal lobe hemorrhage. Transferred to [facility]. PMH: Gold III COPD, OSA, HTN, PTSD, depression.      SLP Plan  Continue with current plan of care       Recommendations  Diet recommendations: Dysphagia 3 (mechanical soft);Thin liquid Liquids provided via: Cup;Straw Medication Administration: Whole meds with puree Supervision: Patient able to self feed;Intermittent supervision to cue for compensatory strategies Compensations: Minimize environmental distractions;Lingual sweep for clearance of pocketing Postural Changes and/or Swallow Maneuvers: Seated upright 90 degrees                Oral Care Recommendations: Oral care BID Follow up Recommendations: Skilled Nursing facility SLP Visit Diagnosis: Dysphagia, oropharyngeal phase (R13.12) Plan: Continue with current plan of care       GO                Houston Siren 09/21/2018, 2:08 PM   Orbie Pyo Colvin Caroli.Ed Risk analyst [phone] Office [phone]

## 2018-09-21 NOTE — Progress Notes (Signed)
Inpatient Rehabilitation Admissions Coordinator  I met with patient at bedside and contacted pt's wife, [name] and brother in law, Dr. Wynetta Emery by phone . We discussed caregiver support at home and pt's improved tolerance with therapies. Discussed with Dr. Naaman Plummer. We can admit pt to inpt rehab today. I will contact Dr. Marthenia Rolling, RN CM, Ellard Artis as well as SW, [place] of rehab plan. I will make the arrangements to admit today.  Danne Baxter, RN, MSN Rehab Admissions Coordinator [phone] 09/21/2018 12:06 PM

## 2018-09-21 NOTE — Progress Notes (Signed)
Meredith Staggers, MD  Physician  Physical Medicine and Rehabilitation  Consult Note  Signed  Date of Service:  09/14/2018 6:34 AM       Related encounter: ED to Hosp-Admission (Discharged) from 08/31/2018 in [facility] [address]      Signed      Expand All Collapse All    Show:Clear all _0 Manual_1 Template_2 Copied  Added by: _3 Cathlyn Parsons, PA-C_4 Meredith Staggers, MD  _5 Hover for details      Physical Medicine and Rehabilitation Consult Reason for Consult: Decreased functional mobility Referring Physician: Critical care   HPI: Jeremiah [name] is a 73 y.o. right-handed male with history of COPD followed by Dr. Chase Caller, sleep apnea, hypertension, PTSD.  Per chart review patient lives with spouse.  Reportedly independent prior to admission.  Presented 08/30/2020 [facility] with increasing shortness of breath.  Placed on noninvasive motion ventilation and required intubation.  In the ED patient had significant bronchospasm.  CTA of chest with incidental finding of small left lower lobe pulmonary emboli.  CT of the head showed a 5 mm acute subcortical hemorrhage in the left frontal lobe.  EKG showed ST elevation and PR depression.  Troponin 1.9 elevated from 0.7.  Transferred to [facility].  Intravenous heparin deferred secondary to subcortical hemorrhage.  Echocardiogram with ejection fraction of 25% severely reduced systolic function findings consistent with Takatsubo cardiomyopathy.  Lower extremity Dopplers negative for DVT.  Follow-up MRI of the brain 09/02/2018 again 5 mm focus of acute hemorrhage left frontal lobe associated with a small subcortical acute early subacute infarction.  Additional punctate foci of acute early subacute infarcts present in the left anterior frontal lobe and left temporal lobe.  MRA with no large vessel occlusion or aneurysm.  Patient with intermittent bouts of agitation and restlessness required Precedex.  EEG  was negative for seizure.  Required intubation through 09/09/2018 and extubated.  Hospital course AKI with creatinine 1.34-1.53.  Bouts of hypotension requiring pressors.  Patient currently remains n.p.o. with alternative means of nutritional support.  Therapy evaluations completed with recommendations of physical medicine rehab consult.   Review of Systems  Unable to perform ROS: Acuity of condition       Past Medical History:  Diagnosis Date   COPD (chronic obstructive pulmonary disease) (HCC)    Depression    Dilated cardiomyopathy (HCC)    Hypertension    PTSD (post-traumatic stress disorder)    Sleep apnea         Past Surgical History:  Procedure Laterality Date   HAMMER TOE SURGERY  April 2017        Family History  Problem Relation Age of Onset   Asthma Maternal Grandmother    Heart disease Father    Dementia Mother    Social History:  reports that he quit smoking about 31 years ago. His smoking use included cigarettes. He has a 30.00 pack-year smoking history. He has never used smokeless tobacco. He reports current alcohol use of about 2.0 standard drinks of alcohol per week. He reports that he does not use drugs. Allergies: No Known Allergies       Medications Prior to Admission  Medication Sig Dispense Refill   albuterol (PROVENTIL HFA;VENTOLIN HFA) 108 (90 Base) MCG/ACT inhaler Inhale 2 puffs into the lungs every 4 (four) hours as needed for wheezing or shortness of breath (((PLAN B))). 1 Inhaler 5   albuterol (PROVENTIL) (2.5 MG/3ML) 0.083% nebulizer solution Take 2.5 mg by nebulization every 6 (six) hours  as needed for wheezing or shortness of breath.     ALPRAZolam (XANAX) 0.5 MG tablet TAKE 1 TABLET BY MOUTH TWICE A DAY AS NEEDED FOR ANXIETY (Patient taking differently: Take 0.5 mg by mouth 2 (two) times daily as needed for anxiety. ) 60 tablet 1   amLODipine (NORVASC) 2.5 MG tablet Take 2.5 mg by mouth daily.      aspirin EC 81  MG tablet Take 81 mg by mouth daily.     benzonatate (TESSALON) 100 MG capsule Take 1 capsule (100 mg total) by mouth 3 (three) times daily. (Patient taking differently: Take 100 mg by mouth 3 (three) times daily as needed for cough. ) 45 capsule 5   buPROPion (WELLBUTRIN XL) 150 MG 24 hr tablet Take 150 mg by mouth daily.     dextromethorphan-guaiFENesin (MUCINEX DM) 30-600 MG per 12 hr tablet Take 2 tablets by mouth 2 (two) times daily as needed (cough and congestion).      erythromycin ophthalmic ointment Place 1 application into both eyes at bedtime.      esomeprazole (NEXIUM) 40 MG capsule Take 40 mg by mouth daily before breakfast.   2   famotidine (PEPCID) 20 MG tablet Take 20 mg by mouth at bedtime.     fluticasone (FLONASE) 50 MCG/ACT nasal spray Place 2 sprays into both nostrils 2 (two) times daily.     magnesium oxide (MAG-OX) 400 MG tablet Take 400 mg by mouth 2 (two) times daily. For leg cramps     pravastatin (PRAVACHOL) 10 MG tablet Take 10 mg by mouth daily.     Respiratory Therapy Supplies (FLUTTER) DEVI Use as directed 1 each 0   Respiratory Therapy Supplies (NEBULIZER COMPRESSOR) KIT Use as directed DX: J44.9 1 each 0   sildenafil (VIAGRA) 100 MG tablet Take 100 mg by mouth daily as needed for erectile dysfunction.     tamsulosin (FLOMAX) 0.4 MG CAPS capsule Take 0.4 mg by mouth daily with supper.     traZODone (DESYREL) 50 MG tablet Take 1 tablet by mouth at bedtime as needed for sleep.      TRELEGY ELLIPTA 100-62.5-25 MCG/INH AEPB TAKE 1 PUFF BY MOUTH EVERY DAY (Patient taking differently: Inhale 1 puff into the lungs daily. ) 180 each 1    Home: Home Living Family/patient expects to be discharged to:: Private residence Living Arrangements: Spouse/significant other  Functional History: Prior Function Level of Independence: Needs assistance Gait / Transfers Assistance Needed: Appears pt was independent with mobility. Pt unable to  answer questions related to prior functional status Functional Status:  Mobility: Bed Mobility Overal bed mobility: Needs Assistance Bed Mobility: Supine to Sit Supine to sit: Mod assist General bed mobility comments: Assist to bring legs off of bed, elevate trunk into sitting and bring hips to EOB Transfers Overall transfer level: Needs assistance Equipment used: Rolling walker (2 wheeled) Transfers: Sit to/from Stand, [employer] Transfers Sit to Stand: Mod assist Stand pivot transfers: Mod assist General transfer comment: Assist to bring hips up and for balance. Pt slow to rise. Pivotal steps bed to chair with walker with assist for balance and support. Assist to properly place rt hand on walker  ADL:  Cognition: Cognition Overall Cognitive Status: No family/caregiver present to determine baseline cognitive functioning Arousal/Alertness: Awake/alert Orientation Level: Oriented to person, Disoriented to place, Disoriented to time, Disoriented to situation Attention: Sustained Sustained Attention: Impaired Sustained Attention Impairment: Functional basic Problem Solving: Impaired Problem Solving Impairment: Functional basic Behaviors: Restless Safety/Judgment: Impaired Cognition  Arousal/Alertness: Awake/alert Behavior During Therapy: Restless Overall Cognitive Status: No family/caregiver present to determine baseline cognitive functioning Area of Impairment: Following commands, Problem solving, Safety/judgement Following Commands: Follows one step commands inconsistently Safety/Judgement: Decreased awareness of safety, Decreased awareness of deficits Problem Solving: Slow processing, Decreased initiation, Difficulty sequencing, Requires verbal cues, Requires tactile cues General Comments: Pt not answering most questions so difficult to assess. Decr attention to rt Difficult to assess due to: Impaired communication  Blood pressure (!) 148/93, pulse (!) 101, temperature  98.3 F (36.8 C), temperature source Oral, resp. rate (!) 22, height _0  (1.905 m), weight 66.1 kg, SpO2 99 %. Physical Exam  Constitutional: No distress.  In wrist restraints. Appears lethargic  HENT:  Head: Normocephalic.  Eyes: Pupils are equal, round, and reactive to light.  Neck:  NGT  Cardiovascular:  tachy  Respiratory: Effort normal.  GI: Soft.  Neurological:  Opens eyes to verbal and tactile cues. [place]old [place]. Not much after that. Speech low volume and dysarthric. Moved all 4 spontaneously and to command. Senses pain in all 4's  Skin: Skin is warm. He is not diaphoretic.  Psychiatric:  Flat, lethargic    LabResultsLast24Hours  Results for orders placed or performed during the hospital encounter of 08/31/18 (from the past 24 hour(s))  Glucose, capillary     Status: Abnormal   Collection Time: 09/13/18  7:37 AM  Result Value Ref Range   Glucose-Capillary 120 (H) 70 - 99 mg/dL  Glucose, capillary     Status: Abnormal   Collection Time: 09/13/18 11:18 AM  Result Value Ref Range   Glucose-Capillary 114 (H) 70 - 99 mg/dL  Glucose, capillary     Status: Abnormal   Collection Time: 09/13/18  3:24 PM  Result Value Ref Range   Glucose-Capillary 107 (H) 70 - 99 mg/dL  Glucose, capillary     Status: Abnormal   Collection Time: 09/13/18  7:51 PM  Result Value Ref Range   Glucose-Capillary 111 (H) 70 - 99 mg/dL  Glucose, capillary     Status: Abnormal   Collection Time: 09/14/18 12:16 AM  Result Value Ref Range   Glucose-Capillary 103 (H) 70 - 99 mg/dL   Comment 1 Notify RN   CBC     Status: Abnormal   Collection Time: 09/14/18  4:32 AM  Result Value Ref Range   WBC 15.4 (H) 4.0 - 10.5 K/uL   RBC 5.13 4.22 - 5.81 MIL/uL   Hemoglobin 15.6 13.0 - 17.0 g/dL   HCT 47.8 39.0 - 52.0 %   MCV 93.2 80.0 - 100.0 fL   MCH 30.4 26.0 - 34.0 pg   MCHC 32.6 30.0 - 36.0 g/dL   RDW 12.4 11.5 - 15.5 %   Platelets 379 150 -  400 K/uL   nRBC 0.0 0.0 - 0.2 %  Basic metabolic panel     Status: Abnormal   Collection Time: 09/14/18  4:32 AM  Result Value Ref Range   Sodium 141 135 - 145 mmol/L   Potassium 3.9 3.5 - 5.1 mmol/L   Chloride 104 98 - 111 mmol/L   CO2 27 22 - 32 mmol/L   Glucose, Bld 127 (H) 70 - 99 mg/dL   BUN 26 (H) 8 - 23 mg/dL   Creatinine, Ser 1.34 (H) 0.61 - 1.24 mg/dL   Calcium 9.9 8.9 - 10.3 mg/dL   GFR calc non Af Amer 53 (L) >60 mL/min   GFR calc Af Amer >60 >  60 mL/min   Anion gap 10 5 - 15  Glucose, capillary     Status: Abnormal   Collection Time: 09/14/18  4:35 AM  Result Value Ref Range   Glucose-Capillary 116 (H) 70 - 99 mg/dL   Comment 1 Notify RN       ImagingResults(Last48hours)  Dg Chest 1 View  Result Date: 09/14/2018 CLINICAL DATA:  73 year old male with recent respiratory failure, pneumonia. EXAM: CHEST  1 VIEW COMPARISON:  09/07/2018 and earlier. FINDINGS: Portable AP semi upright view at 0420 hours. Extubated. Enteric feeding tube remains in place. There is a new right upper extremity approach PICC line with tip at the lower SVC level. Mediastinal contours are stable and within normal limits. Lung volumes are stable. Allowing for portable technique the lungs are clear. Incidental nipple shadows. Chronic left rib fractures again noted. IMPRESSION: 1. Extubated. Enteric feeding tube remains in place. Right upper extremity approach PICC line tip at the lower SVC level. 2. Stable lung volumes.  No acute cardiopulmonary abnormality. Electronically Signed   By: Genevie Ann M.D.   On: 09/14/2018 05:21      Assessment/Plan: Diagnosis: 73 yo male with hx of ?dementia now with debility, cognitive deficits related to left frontal hemorrhage and multiple medical issues 1. Does the need for close, 24 hr/day medical supervision in concert with the patient's rehab needs make it unreasonable for this patient to be served in a less intensive setting?  Yes 2. Co-Morbidities requiring supervision/potential complications: dysphagia, COPD, CM, agitation/behavioral issues 3. Due to bladder management, bowel management, safety, skin/wound care, disease management, medication administration, pain management and patient education, does the patient require 24 hr/day rehab nursing? Potentially 4. Does the patient require coordinated care of a physician, rehab nurse, PT (1-2 hrs/day, 5 days/week), OT (1-2 hrs/day, 5 days/week) and SLP (1-2 hrs/day, 5 days/week) to address physical and functional deficits in the context of the above medical diagnosis(es)? Yes Addressing deficits in the following areas: balance, endurance, locomotion, strength, transferring, bowel/bladder control, bathing, dressing, feeding, grooming, toileting, cognition, speech, swallowing and psychosocial support 5. Can the patient actively participate in an intensive therapy program of at least 3 hrs of therapy per day at least 5 days per week? Yes and Potentially 6. The potential for patient to make measurable gains while on inpatient rehab is good and fair 7. Anticipated functional outcomes upon discharge from inpatient rehab are min assist  with PT, min assist with OT, supervision and min assist with SLP. 8. Estimated rehab length of stay to reach the above functional goals is: 20-30 days 9. Anticipated D/C setting: Home 10. Anticipated post D/C treatments: [place] therapy 11. Overall Rehab/Functional Prognosis: good and fair  RECOMMENDATIONS: This patient's condition is appropriate for continued rehabilitative care in the following setting: see below Patient has agreed to participate in recommended program. N/A Note that insurance prior authorization may be required for reimbursement for recommended care.  Comment: Would like to see patient demonstrate more activity tolerance before we consider admission to inpatient rehab. Rehab Admissions Coordinator to follow  up.  Thanks,  Meredith Staggers, MD, Mellody Drown  I have personally performed a face to face diagnostic evaluation of this patient. Additionally, I have examined pertinent labs and radiographic images. I have reviewed and concur with the physician assistant's documentation above.    Lavon Paganini Angiulli, PA-C 09/14/2018        Revision History  Routing History

## 2018-09-22 ENCOUNTER — Inpatient Hospital Stay (HOSPITAL_COMMUNITY): Payer: Medicare Other | Admitting: Occupational Therapy

## 2018-09-22 ENCOUNTER — Inpatient Hospital Stay (HOSPITAL_COMMUNITY): Payer: Medicare Other

## 2018-09-22 ENCOUNTER — Inpatient Hospital Stay (HOSPITAL_COMMUNITY): Payer: Medicare Other | Admitting: Physical Therapy

## 2018-09-22 DIAGNOSIS — I611 Nontraumatic intracerebral hemorrhage in hemisphere, cortical: Secondary | ICD-10-CM

## 2018-09-22 LAB — CBC WITH DIFFERENTIAL/PLATELET
Abs Immature Granulocytes: 0.02 10*3/uL (ref 0.00–0.07)
Basophils Absolute: 0.1 10*3/uL (ref 0.0–0.1)
Basophils Relative: 1 %
Eosinophils Absolute: 0.6 10*3/uL — ABNORMAL HIGH (ref 0.0–0.5)
Eosinophils Relative: 7 %
HCT: 35.4 % — ABNORMAL LOW (ref 39.0–52.0)
Hemoglobin: 11.7 g/dL — ABNORMAL LOW (ref 13.0–17.0)
Immature Granulocytes: 0 %
Lymphocytes Relative: 25 %
Lymphs Abs: 2.1 10*3/uL (ref 0.7–4.0)
MCH: 30.2 pg (ref 26.0–34.0)
MCHC: 33.1 g/dL (ref 30.0–36.0)
MCV: 91.2 fL (ref 80.0–100.0)
Monocytes Absolute: 1.1 10*3/uL — ABNORMAL HIGH (ref 0.1–1.0)
Monocytes Relative: 13 %
Neutro Abs: 4.6 10*3/uL (ref 1.7–7.7)
Neutrophils Relative %: 54 %
Platelets: 349 10*3/uL (ref 150–400)
RBC: 3.88 MIL/uL — ABNORMAL LOW (ref 4.22–5.81)
RDW: 12.2 % (ref 11.5–15.5)
WBC: 8.5 10*3/uL (ref 4.0–10.5)
nRBC: 0 % (ref 0.0–0.2)

## 2018-09-22 LAB — COMPREHENSIVE METABOLIC PANEL
ALT: 14 U/L (ref 0–44)
AST: 20 U/L (ref 15–41)
Albumin: 2.5 g/dL — ABNORMAL LOW (ref 3.5–5.0)
Alkaline Phosphatase: 63 U/L (ref 38–126)
Anion gap: 9 (ref 5–15)
BUN: 11 mg/dL (ref 8–23)
CO2: 25 mmol/L (ref 22–32)
Calcium: 9.1 mg/dL (ref 8.9–10.3)
Chloride: 104 mmol/L (ref 98–111)
Creatinine, Ser: 1.23 mg/dL (ref 0.61–1.24)
GFR calc Af Amer: >60 mL/min (ref >60)
GFR calc non Af Amer: 59 mL/min — ABNORMAL LOW (ref >60)
Glucose, Bld: 96 mg/dL (ref 70–99)
Potassium: 3.4 mmol/L — ABNORMAL LOW (ref 3.5–5.1)
Sodium: 138 mmol/L (ref 135–145)
Total Bilirubin: 0.7 mg/dL (ref 0.3–1.2)
Total Protein: 5.8 g/dL — ABNORMAL LOW (ref 6.5–8.1)

## 2018-09-22 MED ORDER — ACETAMINOPHEN 325 MG PO TABS
650.0000 mg | ORAL_TABLET | Freq: Four times a day (QID) | ORAL | Status: DC | PRN
  Administered 2018-09-22: 650 mg via ORAL
  Filled 2018-09-22: qty 2

## 2018-09-22 MED ORDER — QUETIAPINE FUMARATE 25 MG PO TABS
25.0000 mg | ORAL_TABLET | Freq: Two times a day (BID) | ORAL | Status: DC
  Administered 2018-09-22 – 2018-09-24 (×4): 25 mg via ORAL
  Filled 2018-09-22 (×4): qty 1

## 2018-09-22 NOTE — Progress Notes (Signed)
Asked Dr. Naaman Plummer if PICC line is still indicated. He stated he will review chart and assess need.

## 2018-09-22 NOTE — Progress Notes (Signed)
Patient information reviewed and entered into e[facility]hab System by Becky Laiken Nohr, PPS coordinator. Information including medical coding, function ability, and quality indicators will be reviewed and updated through discharge.   

## 2018-09-22 NOTE — Evaluation (Signed)
Physical Therapy Assessment and Plan  Patient Details  Name: Beniah Magnan MRN: [id number] Date of Birth: [birth year]  PT Diagnosis: Abnormality of gait, Cognitive deficits, Coordination disorder, Difficulty walking, Hemiparesis dominant, Impaired cognition and Muscle weakness Rehab Potential: Good ELOS: 10-12 days   Today's Date: 09/22/2018 PT Individual Time: 806-900 PT Individual Time Calculation (min): 54 min    Problem List:  Patient Active Problem List   Diagnosis Date Noted  . ICH (intracerebral hemorrhage) ([place]) 09/21/2018  . Delirium due to another medical condition, acute, hyperactive 09/14/2018  . Dementia ([place]) 09/14/2018  . Acute combined systolic and diastolic heart failure ([place]) 09/07/2018  . Acute renal failure with acute cortical necrosis ([place]) 09/07/2018  . Pulmonary embolism ([place])   . Intracerebral hemorrhage 08/31/2018  . Chronic rhinitis 03/02/2018  . Chronic cough 01/22/2017  . Constipation 01/01/2016  . Acute URI 03/13/2015  . Medicare annual wellness visit, subsequent 12/28/2014  . Essential hypertension 12/12/2014  . Generalized anxiety disorder 11/02/2014  . OSA (obstructive sleep apnea) 04/26/2014  . COPD, frequent exacerbations ([place]) 04/25/2013    Past Medical History:  Past Medical History:  Diagnosis Date  . COPD (chronic obstructive pulmonary disease) ([place])   . Depression   . Dilated cardiomyopathy ([place])   . Hypertension   . PTSD (post-traumatic stress disorder)   . Sleep apnea    Past Surgical History:  Past Surgical History:  Procedure Laterality Date  . HAMMER TOE SURGERY  April 2017    Assessment & Plan Clinical Impression: Patient is a 73 y.o. year old male with history of COPD followed by Dr. Chase Caller, sleep apnea, hypertension, PTSD. Per chart review lives with spouse. He has brother in [place] who is a Engineer, drilling. Reportedly independent prior to admission. Presented 08/31/2018 to [facility] with increasing  shortness of breath. Placed on noninvasive motion ventilation and required intubation. In the ED patient had significant bronchospasm. CTA of chest with incidental findings of small left lower lobe pulmonary emboli. CT of the head showed a 5 mm acute subcortical hemorrhage in the left frontal lobe. EKG showed ST elevation and PR depression. Troponin 1.9 elevated from 0.7. CT angiogram of the chest showed a single subsegmental pulmonary embolism to the left lower lobe. No obvious signs of hemodynamic instability no anticoagulation due to intracranial hemorrhage. Transferred to [facility]. Intravenous heparin deferred secondary to subcortical hemorrhage. Echocardiogram with ejection fraction 25% severely reduced systolic function findings consistent with Takatsubocardiomyopathy. Lower extremity Dopplers negative for DVT. Follow-up MRI of the brain 09/02/2018 again showed 5 mm focus of acute hemorrhage left frontal lobe associated with a small subcortical acute early subacute infarction. Additional punctate foci of acute early subacute infarcts present in the left anterior frontal lobe and left temporal lobe. MRA with no large vessel occlusion or aneurysm. Cleared for low dose aspirin daily.Patient with intermittent bouts of agitation and restlessness required Precedex. EEG was negative for seizure. Required intubation through 09/09/2018 and extubated. Hospital course AKI with creatinine 1.34-1.53. Bouts of hypotension requiring pressors. Acute systolic congestive heart failure with echocardiogram completed ejection fraction of 93% normal systolic function and remains on low-dose Lasix.[name] Kitchen His diet has been advanced to mechanical soft. Bouts of agitation/delirium and restlessness with telemetry monitor for safety and maintained on Seroquel. He is off Precedex drip. Therapy evaluations completed and patient was admitted for a comprehensive rehab program..  Patient transferred to CIR on  09/21/2018 .   Patient currently requires min with mobility secondary to muscle weakness, decreased cardiorespiratoy endurance,  decreased coordination, decreased attention to right, decreased attention, decreased awareness, decreased problem solving, decreased safety awareness, decreased memory and delayed processing, and decreased standing balance, decreased postural control and decreased balance strategies.  Prior to hospitalization, patient was independent  with mobility and lived with [employer]) in a House home.  Home access is 2Stairs to enter.  Patient will benefit from skilled PT intervention to maximize safe functional mobility, minimize fall risk and decrease caregiver burden for planned discharge home with 24 hour supervision.  Anticipate patient will benefit from follow up [place] at discharge.  PT - End of Session Activity Tolerance: Tolerates 30+ min activity with multiple rests Endurance Deficit: Yes Endurance Deficit Description: 2/2 generalized deconditioning PT Assessment Rehab Potential (ACUTE/IP ONLY): Good PT Barriers to Discharge: Home environment access/layout;Behavior;Incontinence PT Barriers to Discharge Comments: 2 STE without rails, impairec cognition PT Patient demonstrates impairments in the following area(s): Balance;Behavior;Pain;Endurance;Safety;Perception;Sensory PT Transfers Functional Problem(s): Bed Mobility;Car;Bed to Chair;Furniture;Floor PT Locomotion Functional Problem(s): Ambulation;Wheelchair Mobility;Stairs PT Plan PT Intensity: Minimum of 1-2 x/day ,45 to 90 minutes PT Frequency: 5 out of 7 days PT Duration Estimated Length of Stay: 10-12 days PT Treatment/Interventions: Ambulation/gait training;Cognitive remediation/compensation;Discharge planning;DME/adaptive equipment instruction;Functional mobility training;Pain management;Psychosocial support;Splinting/orthotics;Therapeutic Activities;UE/LE Strength taining/ROM;Visual/perceptual  remediation/compensation;Wheelchair propulsion/positioning;UE/LE Coordination activities;Therapeutic Exercise;Skin care/wound management;Stair training;Patient/family education;Neuromuscular re-education;Functional electrical stimulation;Disease management/prevention;Community reintegration;Balance/vestibular training PT Transfers Anticipated Outcome(s): supervision with LRAD PT Locomotion Anticipated Outcome(s): supervision with LRAD except min assist 2 steps without rails with LRAD PT Recommendation Recommendations for Other Services: Speech consult Follow Up Recommendations: Home health PT;24 hour supervision/assistance Patient destination: Home Equipment Recommended: To be determined  Skilled Therapeutic Intervention Patient received in bed & agreeable to tx. Pt with c/o HA during session & pain meds requested. PT evaluation initiated & dons B ted hose & socks total assist for time management. Pt completes supine>sitting EOB with supervision and hospital bed features and sit<>stand with min assist. Pt completes stand pivot transfers with min assist, car transfer with min assist without AD, ambulates 40 ft without AD & min assist with impaired coordination RLE & decreased weight shifting L, negotiates ramp & uneven surface without AD & mod assist, and negotiates 12 steps (6" + 3") with B rails & min assist. Pt retrieves object from floor with min assist. Provided pt with w/c cushion to prevent skin breakdown when OOB. At end of session pt left sitting in w/c with chair alarm donned & call bell in reach with therapist reviewing use of bell with pt.  At beginning of session, pt found to be incontinent of urine & pt unaware. Provided assistance for donning clean brief for time management.  PT Evaluation Precautions/Restrictions Precautions Precautions: Fall Precaution Comments: impulsive General Chart Reviewed: Yes Additional Pertinent History: COPD, HTN, PTSD, depression, dilated  cardiomyopathy Response to Previous Treatment: Not applicable Family/Caregiver Present: No  Home Living/Prior Functioning Home Living Available Help at Discharge: Family;Available 24 hours/day Type of Home: House Home Access: Stairs to enter [employer] of Steps: 2 Entrance Stairs-Rails: None Home Layout: One level Bathroom Shower/Tub: Tub/shower unit;Walk-in shower Bathroom Toilet: Standard Additional Comments: per chart pt with mild memory issues PTA  Lives With: [employer]) Prior Function Level of Independence: Independent with gait;Independent with transfers;Independent with basic ADLs;Independent with homemaking with ambulation  Able to Take Stairs?: Yes Vocation: Retired Public house manager Requirements: [employer] Comments: has morkie dog Designer, television/film set)  Vision/Perception  Per OT report: pt wears glasses at all times at baseline. No apparent changes in baseline vision. Perception Perception: Impaired Inattention/Neglect: (slight decreased attention  to R side of body)  Cognition Overall Cognitive Status: Impaired/Different from baseline Arousal/Alertness: Awake/alert Orientation Level: Oriented to person(oriented to hospital) Attention: Focused Focused Attention: Impaired Sustained Attention: Impaired Memory: Impaired Memory Impairment: Decreased recall of new information Awareness: Impaired Awareness Impairment: Intellectual impairment Problem Solving: Impaired Behaviors: Impulsive Safety/Judgment: Impaired  Sensation Sensation Light Touch: Not tested Hot/Cold: Appears Intact Proprioception: Impaired by gross assessment Stereognosis: Not tested Coordination Gross Motor Movements are Fluid and Coordinated: No(impaired RLE)  Motor  Motor Motor: Abnormal postural alignment and control(R hemiparesis) Motor - Skilled Clinical Observations: generalized deconditioning   Mobility Bed Mobility Bed Mobility: Supine to Sit(bed rails & HOB elevated) Supine to  Sit: Supervision/Verbal cueing Transfers Transfers: Sit to Stand;Stand Pivot Transfers Sit to Stand: Contact Guard/Touching assist Stand Pivot Transfers: Minimal Assistance - Patient > 75% Stand Pivot Transfer Details: Verbal cues for precautions/safety;Verbal cues for sequencing  Locomotion  Gait Ambulation: Yes Gait Assistance: Minimal Assistance - Patient > 75% Gait Distance (Feet): 40 Feet Assistive device: None Gait Gait: Yes Gait Pattern: Decreased step length - right;Decreased step length - left;Decreased stride length;Decreased hip/knee flexion - right;Decreased hip/knee flexion - left;Decreased dorsiflexion - left;Decreased dorsiflexion - right;Decreased weight shift to left Gait velocity: decreased Stairs / Additional Locomotion Stairs: Yes Stairs Assistance: Minimal Assistance - Patient > 75% Stair Management Technique: Two rails Number of Stairs: 12 Height of Stairs: (6" + 3") Ramp: Moderate Assistance - Patient 50 - 74%(ambulatory without AD)    Trunk/Postural Assessment  Postural Control Postural Control: Deficits on evaluation Righting Reactions: delayed Protective Responses: delayed   Balance Balance Balance Assessed: Yes Dynamic Standing Balance Dynamic Standing - Balance Support: No upper extremity supported Dynamic Standing - Level of Assistance: 4: Min assist Dynamic Standing - Balance Activities: (gait without AD)  Extremity Assessment  RUE Assessment RUE Assessment: Within Functional Limits LUE Assessment LUE Assessment: Within Functional Limits  RLE Assessment RLE Assessment: Within Functional Limits General Strength Comments: grossly 3+/5, not formally tested LLE Assessment LLE Assessment: Within Functional Limits General Strength Comments: grossly 3+/5, not formally tested    Refer to Care Plan for Long Term Goals  Recommendations for other services: None   Discharge Criteria: Patient will be discharged from PT if patient refuses  treatment 3 consecutive times without medical reason, if treatment goals not met, if there is a change in medical status, if patient makes no progress towards goals or if patient is discharged from hospital.  The above assessment, treatment plan, treatment alternatives and goals were discussed and mutually agreed upon: No family available/patient unable  [place] 09/22/2018, 1:07 PM

## 2018-09-22 NOTE — Evaluation (Signed)
Occupational Therapy Assessment and Plan  Patient Details  Name: Jeremiah [name] MRN: [id number] Date of Birth: 1945/12/10  OT Diagnosis: cognitive deficits, muscle weakness (generalized) and coordination disorder Rehab Potential: Rehab Potential (ACUTE ONLY): Good ELOS: 7-10 days   Today's Date: 09/22/2018 OT Individual Time: 1100-1154 OT Individual Time Calculation (min): 54 min     Problem List:  Patient Active Problem List   Diagnosis Date Noted  . ICH (intracerebral hemorrhage) ([place]) 09/21/2018  . Delirium due to another medical condition, acute, hyperactive 09/14/2018  . Dementia ([place]) 09/14/2018  . Acute combined systolic and diastolic heart failure ([place]) 09/07/2018  . Acute renal failure with acute cortical necrosis ([place]) 09/07/2018  . Pulmonary embolism ([place])   . Intracerebral hemorrhage 08/31/2018  . Chronic rhinitis 03/02/2018  . Chronic cough 01/22/2017  . Constipation 01/01/2016  . Acute URI 03/13/2015  . Medicare annual wellness visit, subsequent 12/28/2014  . Essential hypertension 12/12/2014  . Generalized anxiety disorder 11/02/2014  . OSA (obstructive sleep apnea) 04/26/2014  . COPD, frequent exacerbations ([place]) 04/25/2013    Past Medical History:  Past Medical History:  Diagnosis Date  . COPD (chronic obstructive pulmonary disease) ([place])   . Depression   . Dilated cardiomyopathy ([place])   . Hypertension   . PTSD (post-traumatic stress disorder)   . Sleep apnea    Past Surgical History:  Past Surgical History:  Procedure Laterality Date  . HAMMER TOE SURGERY  April 2017    Assessment & Plan Clinical Impression: Patient is a 73 y.o. year old male with history of COPD followed by Dr. Chase Caller, sleep apnea, hypertension, PTSD. Per chart review lives with spouse. He has brother in [place] who is a Engineer, drilling. Reportedly independent prior to admission. Presented 08/31/2018 to [facility] with increasing shortness of breath. Placed on  noninvasive motion ventilation and required intubation. In the ED patient had significant bronchospasm. CTA of chest with incidental findings of small left lower lobe pulmonary emboli. CT of the head showed a 5 mm acute subcortical hemorrhage in the left frontal lobe. EKG showed ST elevation and PR depression. Troponin 1.9 elevated from 0.7. CT angiogram of the chest showed a single subsegmental pulmonary embolism to the left lower lobe. No obvious signs of hemodynamic instability no anticoagulation due to intracranial hemorrhage. Transferred to [facility]. Intravenous heparin deferred secondary to subcortical hemorrhage. Echocardiogram with ejection fraction 25% severely reduced systolic function findings consistent with Takatsubocardiomyopathy. Lower extremity Dopplers negative for DVT. Follow-up MRI of the brain 09/02/2018 again showed 5 mm focus of acute hemorrhage left frontal lobe associated with a small subcortical acute early subacute infarction. Additional punctate foci of acute early subacute infarcts present in the left anterior frontal lobe and left temporal lobe. MRA with no large vessel occlusion or aneurysm. Cleared for low dose aspirin daily.Patient with intermittent bouts of agitation and restlessness required Precedex. EEG was negative for seizure. Required intubation through 09/09/2018 and extubated. Hospital course AKI with creatinine 1.34-1.53. Bouts of hypotension requiring pressors. Acute systolic congestive heart failure with echocardiogram completed ejection fraction of 84% normal systolic function and remains on low-dose Lasix.[name] Kitchen His diet has been advanced to mechanical soft. Bouts of agitation/delirium and restlessness with telemetry monitor for safety and maintained on Seroquel. He is off Precedex drip. Therapy evaluations completed and patient was admitted for a comprehensive rehab program .  Patient transferred to CIR on 09/21/2018 .    Patient currently  requires min with basic self-care skills and IADL secondary to muscle weakness,  decreased cardiorespiratoy endurance, decreased coordination, decreased awareness, decreased problem solving, decreased safety awareness and decreased memory and decreased standing balance and decreased balance strategies.  Prior to hospitalization, patient could complete ADLs and IADLs with modified independent .  Patient will benefit from skilled intervention to decrease level of assist with basic self-care skills prior to discharge home with care partner.  Anticipate patient will require 24 hour supervision and follow up outpatient.  OT - End of Session Activity Tolerance: Decreased this session Endurance Deficit: Yes Endurance Deficit Description: 2/2 generalized deconditioning OT Assessment Rehab Potential (ACUTE ONLY): Good OT Barriers to Discharge: Other (comments) OT Barriers to Discharge Comments: none known at this time OT Patient demonstrates impairments in the following area(s): Balance;Cognition;Endurance;Motor;Pain;Safety OT Basic ADL's Functional Problem(s): Grooming;Bathing;Dressing;Toileting OT Transfers Functional Problem(s): Toilet;Tub/Shower OT Additional Impairment(s): None OT Plan OT Intensity: Minimum of 1-2 x/day, 45 to 90 minutes OT Frequency: 5 out of 7 days OT Duration/Estimated Length of Stay: 7-10 days OT Treatment/Interventions: Balance/vestibular training;Self Care/advanced ADL retraining;Therapeutic Exercise;Cognitive remediation/compensation;DME/adaptive equipment instruction;UE/LE Strength taining/ROM;Community reintegration;Patient/family education;UE/LE Coordination activities;Discharge planning;Functional mobility training;Psychosocial support;Therapeutic Activities OT Self Feeding Anticipated Outcome(s): n/a OT Basic Self-Care Anticipated Outcome(s): supervision OT Toileting Anticipated Outcome(s): supervision OT Bathroom Transfers Anticipated Outcome(s): supervision OT  Recommendation Recommendations for Other Services: Neuropsych consult Patient destination: Home Follow Up Recommendations: Outpatient OT Equipment Recommended: To be determined   Skilled Therapeutic Intervention Upon entering the room, pt seated in wheelchair with no c/o pain and agreeable to OT intervention. OT educated pt on OT purpose, POC, and goals with pt verbalizing understanding. Pt verbalized understanding and agreement. Pt agreeable to shower this session and standing with min guard to ambulate 20' into bathroom with HHA. Pt seated on toilet with min guard and able to void. Pt doffing clothing items with cuing for safety awareness to do while seated on commode chair. OT covered PICC line before transferring into shower. Pt bathing with sit <> stand from TTB with S - min guard overall. Pt drying self and donning Lb clothing with steady assistance for balance. Pt returning to bed level secondary to fatigue and pt reporting SOB and given inhaler with RN notified. O2 saturation is at 98% on room air. Pt taking rest break and then donning pull over shirt with min A to pull down trunk. I V staff removed PICC line at end of session and pt must lay flat for next 30 minutes. Call bell and all needed items within reach upon exiting the room.   OT Evaluation Precautions/Restrictions  Precautions Precautions: Fall Precaution Comments: impulsive Pain Pain Assessment Pain Scale: 0-10 Pain Score: 0-No pain Pain Location: Head Pain Orientation: Anterior Home Living/Prior Functioning Home Living Family/patient expects to be discharged to:: Private residence Living Arrangements: Spouse/significant other Available Help at Discharge: Family, Available 24 hours/day Type of Home: House Home Access: Stairs to enter Technical brewer of Steps: 2 Entrance Stairs-Rails: None Home Layout: One level Bathroom Shower/Tub: Public librarian, Multimedia programmer: Standard Additional  Comments: Wife state patietn had some mild memory issues pta  Lives With: Spouse Prior Function Level of Independence: Independent with homemaking with ambulation, Independent with basic ADLs, Independent with gait, Independent with transfers Vision Baseline Vision/History: Wears glasses Wears Glasses: At all times Patient Visual Report: No change from baseline Cognition Overall Cognitive Status: History of cognitive impairments - at baseline Arousal/Alertness: Awake/alert Orientation Level: Person Year: 2019 Month: June Day of Week: Correct Memory: Impaired Memory Impairment: Decreased recall of new information Immediate Memory Recall: Sock;Blue;Bed Memory  Recall: Sock(1/3) Memory Recall Sock: With Cue Behaviors: Impulsive Sensation Sensation Light Touch: Appears Intact Hot/Cold: Appears Intact Proprioception: Not tested Stereognosis: Not tested Motor  Motor Motor: Abnormal postural alignment and control Motor - Skilled Clinical Observations: generalized deconditioning Mobility  Bed Mobility Bed Mobility: Supine to Sit Supine to Sit: Supervision/Verbal cueing Transfers Sit to Stand: Contact Guard/Touching assist  Trunk/Postural Assessment  Cervical Assessment Cervical Assessment: Within Functional Limits Thoracic Assessment Thoracic Assessment: Within Functional Limits Lumbar Assessment Lumbar Assessment: Within Functional Limits Postural Control Postural Control: Deficits on evaluation Righting Reactions: delayed Protective Responses: delayed  Balance Standardized Balance Assessment Standardized Balance Assessment: Berg Balance Test Berg Balance Test Sit to Stand: Needs minimal aid to stand or to stabilize Standing Unsupported: Able to stand 2 minutes with supervision Sitting with Back Unsupported but Feet Supported on Floor or Stool: Able to sit 2 minutes under supervision Stand to Sit: Uses backs of legs against chair to control descent Transfers: Needs one  person to assist Standing Unsupported with Eyes Closed: Able to stand 10 seconds with supervision Standing Ubsupported with Feet Together: Needs help to attain position and unable to hold for 15 seconds From Standing, Reach Forward with Outstretched Arm: Reaches forward but needs supervision From Standing Position, Pick up Object from Floor: Unable to pick up and needs supervision From Standing Position, Turn to Look Behind Over each Shoulder: Needs supervision when turning Turn 360 Degrees: Needs assistance while turning Standing Unsupported, Alternately Place Feet on Step/Stool: Able to complete >2 steps/needs minimal assist Standing Unsupported, One Foot in Front: Loses balance while stepping or standing Standing on One Leg: Unable to try or needs assist to prevent fall Total Score: 17 Extremity/Trunk Assessment RUE Assessment RUE Assessment: Within Functional Limits LUE Assessment LUE Assessment: Within Functional Limits     Refer to Care Plan for Long Term Goals  Recommendations for other services: Neuropsych   Discharge Criteria: Patient will be discharged from OT if patient refuses treatment 3 consecutive times without medical reason, if treatment goals not met, if there is a change in medical status, if patient makes no progress towards goals or if patient is discharged from hospital.  The above assessment, treatment plan, treatment alternatives and goals were discussed and mutually agreed upon: by patient  Gypsy Decant 09/22/2018, 12:43 PM

## 2018-09-22 NOTE — Evaluation (Signed)
Speech Language Pathology Assessment and Plan  Patient Details  Name: Jeremiah [name] MRN: [id number] Date of Birth: 1945/06/29  SLP Diagnosis: Cognitive Impairments;Dysphagia  Rehab Potential: Good ELOS: 6/17    Today's Date: 09/22/2018 SLP Individual Time: 1330-1430 SLP Individual Time Calculation (min): 60 min   Problem List:  Patient Active Problem List   Diagnosis Date Noted  . ICH (intracerebral hemorrhage) ([place]) 09/21/2018  . Delirium due to another medical condition, acute, hyperactive 09/14/2018  . Dementia ([place]) 09/14/2018  . Acute combined systolic and diastolic heart failure ([place]) 09/07/2018  . Acute renal failure with acute cortical necrosis ([place]) 09/07/2018  . Pulmonary embolism ([place])   . Intracerebral hemorrhage 08/31/2018  . Chronic rhinitis 03/02/2018  . Chronic cough 01/22/2017  . Constipation 01/01/2016  . Acute URI 03/13/2015  . Medicare annual wellness visit, subsequent 12/28/2014  . Essential hypertension 12/12/2014  . Generalized anxiety disorder 11/02/2014  . OSA (obstructive sleep apnea) 04/26/2014  . COPD, frequent exacerbations ([place]) 04/25/2013   Past Medical History:  Past Medical History:  Diagnosis Date  . COPD (chronic obstructive pulmonary disease) ([place])   . Depression   . Dilated cardiomyopathy ([place])   . Hypertension   . PTSD (post-traumatic stress disorder)   . Sleep apnea    Past Surgical History:  Past Surgical History:  Procedure Laterality Date  . HAMMER TOE SURGERY  April 2017    Assessment / Plan / Recommendation Clinical Impression Jeremiah [name] is a 73 year old right-handed male with history of COPD followed by Dr. Chase Caller, sleep apnea, hypertension, PTSD. Per chart review lives with spouse. He has brother in [place] who is a Engineer, drilling. Reportedly independent prior to admission. Presented 08/31/2018 to [facility] with increasing shortness of breath. Placed on noninvasive motion ventilation and required  intubation. In the ED patient had significant bronchospasm. CTA of chest with incidental findings of small left lower lobe pulmonary emboli. CT of the head showed a 5 mm acute subcortical hemorrhage in the left frontal lobe. EKG showed ST elevation and PR depression. Troponin 1.9 elevated from 0.7. CT angiogram of the chest showed a single subsegmental pulmonary embolism to the left lower lobe. No obvious signs of hemodynamic instability no anticoagulation due to intracranial hemorrhage. Transferred to [facility]. Intravenous heparin deferred secondary to subcortical hemorrhage. Echocardiogram with ejection fraction 25% severely reduced systolic function findings consistent with Takatsubocardiomyopathy. Lower extremity Dopplers negative for DVT. Follow-up MRI of the brain 09/02/2018 again showed 5 mm focus of acute hemorrhage left frontal lobe associated with a small subcortical acute early subacute infarction. Additional punctate foci of acute early subacute infarcts present in the left anterior frontal lobe and left temporal lobe. MRA with no large vessel occlusion or aneurysm. Cleared for low dose aspirin daily.Patient with intermittent bouts of agitation and restlessness required Precedex. EEG was negative for seizure. Required intubation through 09/09/2018 and extubated. Hospital course AKI with creatinine 1.34-1.53. Bouts of hypotension requiring pressors. Acute systolic congestive heart failure with echocardiogram completed ejection fraction of 75% normal systolic function and remains on low-dose Lasix.[name] Kitchen His diet has been advanced to mechanical soft. Bouts of agitation/delirium and restlessness with telemetry monitor for safety and maintained on Seroquel. He is off Precedex drip. Therapy evaluations completed and patient was admitted for a comprehensive rehab program.  Pt presents with severe cognitive linguistic impairment, deficits include orientation, basic problem  solving, emergent/safety awareness, sustained attention primarily impacted by memory deficits. Pt supported baseline memory deficits (MRI noted chronic changes to the brain) and  along with h/o of PTSD could further impact cognitive function secondary to acute neurologic insult. Pt demonstrated language of confusion and perseverated on memory impairment being primary reason for hospitalization. Per pt report, pt was receiving treatment for memory deficits from [place] prior to acute neurological insults. Pt demonstrates intermittent awareness of confusion and it's impact on safety. Pt's alertness and overall awareness appears to have improved from previous sessions pre chart review. Formal cognitive linguistic assessment MOCA version 7.1, pt scored 10 out 30 (n=>26) with noted deficits listed above. Pt's language and speech appeared intact. Pt consumed regular and dys 3 limited trials with thin via cup/straw, pt demonstrated appropriate oral clearance and no overt s/s aspiration. SLP recommends continued dys 3 and thin diet with trials of regular textured during ST treatment session for solid advancement and intermittent supervision to ensure pt is alter during PO consumption. Pt would benefit from skilled ST services in order to maximize functional independence and reduce burden of care, requiring 24 hour supervision and continue ST services.   Skilled Therapeutic Interventions          Skilled ST services focused on cognitive skills. SLP administered  cognitive linguistic assessment, educated pt on results and created plan to address deficits. SLP facilitated basic problem solving skills with basic money management task, pt required max A verbal cues to  count change and present requested amount. Pt recognized errors intermittently with no attempt to correct errors. All questions were answered to satisfaction. Pt was left in room with call bell within reach and bed alarm set. ST recommends to continue skilled ST  services.   SLP Assessment  Patient will need skilled Speech Lanaguage Pathology Services during CIR admission    Recommendations  SLP Diet Recommendations: Dysphagia 3 (Mech soft);Thin Liquid Administration via: Cup Medication Administration: Whole meds with puree Supervision: Patient able to self feed;Intermittent supervision to cue for compensatory strategies Compensations: Minimize environmental distractions;Lingual sweep for clearance of pocketing Postural Changes and/or Swallow Maneuvers: Seated upright 90 degrees Oral Care Recommendations: Oral care BID Recommendations for Other Services: Neuropsych consult Patient destination: Home Follow up Recommendations: Home Health SLP;24 hour supervision/assistance Equipment Recommended: None recommended by SLP    SLP Frequency 3 to 5 out of 7 days   SLP Duration  SLP Intensity  SLP Treatment/Interventions 6/17  Minumum of 1-2 x/day, 30 to 90 minutes  Cognitive remediation/compensation;Cueing hierarchy;Dysphagia/aspiration precaution training;Functional tasks;Internal/external aids    Pain Pain Assessment Pain Score: 0-No pain  Prior Functioning Cognitive/Linguistic Baseline: Information not available Type of Home: House  Lives With: Spouse Available Help at Discharge: Family;Available 24 hours/day Education: college Vocation: Retired  Industrial/product designer Term Goals: Week 1: SLP Short Term Goal 1 (Week 1): STG=LTG due to short ELOS  Refer to Care Plan for Long Term Goals  Recommendations for other services: Neuropsych  Discharge Criteria: Patient will be discharged from SLP if patient refuses treatment 3 consecutive times without medical reason, if treatment goals not met, if there is a change in medical status, if patient makes no progress towards goals or if patient is discharged from hospital.  The above assessment, treatment plan, treatment alternatives and goals were discussed and mutually agreed upon: by patient  Jeremiah [name]   [facility] 09/22/2018, 4:11 PM

## 2018-09-22 NOTE — Plan of Care (Signed)
Goals set at overall supervision level

## 2018-09-22 NOTE — Progress Notes (Signed)
[place] PHYSICAL MEDICINE & REHABILITATION PROGRESS NOTE   Subjective/Complaints: Had a good night. Eating breakfast. Denies any breathing issues or pain  ROS: Patient denies fever, rash, sore throat, blurred vision, nausea, vomiting, diarrhea, cough, shortness of breath or chest pain, joint or back pain, headache, or mood change.    Objective:   No results found. Recent Labs    09/22/18 0511  WBC 8.5  HGB 11.7*  HCT 35.4*  PLT 349   Recent Labs    09/21/18 0344 09/22/18 0511  NA 138 138  K 3.3* 3.4*  CL 107 104  CO2 25 25  GLUCOSE 115* 96  BUN 13 11  CREATININE 1.20 1.23  CALCIUM 8.8* 9.1    Intake/Output Summary (Last 24 hours) at 09/22/2018 0911 Last data filed at 09/22/2018 0900 Gross per 24 hour  Intake 238 ml  Output -  Net 238 ml     Physical Exam: Vital Signs Blood pressure 128/86, pulse 91, temperature 99 F (37.2 C), temperature source Oral, resp. rate 16, height 6' 1.5" (1.867 m), weight 72.1 kg, SpO2 95 %. Constitutional: He appearswell-developedand well-nourished.No distress.  HENT:  Head:Normocephalicand atraumatic.  Eyes:Pupils are equal, round, and reactive to light.EOMare normal.  Neck:Normal range of motion.No tracheal deviationpresent. No thyromegalypresent.  Cardiovascular:Normal rate. Exam revealsno friction rub. No murmurheard. Respiratory:Effort normal. Norespiratory distress. He hasno wheezes.  WG:YKZL. He exhibitsno distension. There isno abdominal tenderness.  Neurological: Patient is alert sitting up in bed eating lunch. Follows commands. Provides his name and age. Improved insight and awareness. Normal language. RUE 4/5. LUE 5/5. RLE: 3/5 HF, KE and 4/5 ADF/PF. LLE: 4/5 HF, KE and 5/5 ADF/PF. No focal sensory deficits Skin: Skin iswarm.  Psychiatric: He has anormal mood and affect. Hisbehavior is normal.    Assessment/Plan: 1. Functional deficits secondary to left frontal hemorrhage which require  3+ hours per day of interdisciplinary therapy in a comprehensive inpatient rehab setting.  Physiatrist is providing close team supervision and 24 hour management of active medical problems listed below.  Physiatrist and rehab team continue to assess barriers to discharge/monitor patient progress toward functional and medical goals  Care Tool:  Bathing              Bathing assist       Upper Body Dressing/Undressing Upper body dressing   What is the patient wearing?: Hospital gown only    Upper body assist Assist Level: Moderate Assistance - Patient 50 - 74%    Lower Body Dressing/Undressing Lower body dressing      What is the patient wearing?: Hospital gown only     Lower body assist Assist for lower body dressing: Moderate Assistance - Patient 50 - 74%     Toileting Toileting    Toileting assist Assist for toileting: Maximal Assistance - Patient 25 - 49%     Transfers Chair/bed transfer  Transfers assist     Chair/bed transfer assist level: Minimal Assistance - Patient > 75%     Locomotion Ambulation   Ambulation assist      Assist level: Minimal Assistance - Patient > 75% Assistive device: No Device Max distance: 40 ft   Walk 10 feet activity   Assist     Assist level: Minimal Assistance - Patient > 75% Assistive device: No Device   Walk 50 feet activity   Assist Walk 50 feet with 2 turns activity did not occur: Safety/medical concerns         Walk 150 feet activity  Assist Walk 150 feet activity did not occur: Safety/medical concerns         Walk 10 feet on uneven surface  activity   Assist     Assist level: Moderate Assistance - Patient - 50 - 74% Assistive device: (none)   Tourist information centre manager 50 feet with 2 turns activity    Assist            Wheelchair 150 feet activity     Assist          Medical Problem List and Plan: 1.Decreased functional  mobility with cognitive deficitssecondary to left frontal hemorrhage secondary to hypertensive crisis and multiple medical issues -beginning therapies today, team conf later today 2. Antithrombotics: -DVT/anticoagulation:SCDs. Venous Dopplers negative -antiplatelet therapy: aspirin 81 mg daily 3. Pain Management:Tylenol as needed 4. Mood:Provide emotional support -antipsychotic agents: Seroquel 50 mg twice daily -agitated behavior   improved.    -decr seroquel to 25mg  5. Neuropsych: This patientis notcapable of making decisions on hisown behalf. 6. Skin/Wound Care:Routine skin checks 7. Fluids/Electrolytes/Nutrition:reasonable PO so far  -I personally reviewed all of the patient's labs today, and lab work is within normal limits except for sl low potassium   -replete K+   8.History of COPD. Continue nebulizers. Followed by Dr. Chase Caller 9.Hypertension.Bidil20-37.5 mg 3 times daily 10.Acute diastolic congestive heart failure. Lasix 20 mg daily. Monitor for any signs of fluid overload -daily weights   Filed Weights   09/21/18 1545  Weight: 72.1 kg    11.Small left lower lobe pulmonary embolism identified on CT angiogram of the chest 08/31/2018. No obvious signs of hemodynamic instability. No plans for anticoagulation.    LOS: 1 days A FACE TO FACE EVALUATION WAS PERFORMED  Meredith Staggers 09/22/2018, 9:11 AM

## 2018-09-22 NOTE — Plan of Care (Signed)
  Problem: Consults Goal: RH BRAIN INJURY PATIENT EDUCATION Description Description: See Patient Education module for eduction specifics Outcome: Progressing   Problem: RH BOWEL ELIMINATION Goal: RH STG MANAGE BOWEL WITH ASSISTANCE Description STG Manage Bowel with Assistance. Outcome: Progressing   Problem: RH BLADDER ELIMINATION Goal: RH STG MANAGE BLADDER WITH ASSISTANCE Description STG Manage Bladder With Assistance Outcome: Progressing

## 2018-09-22 NOTE — Patient Care Conference (Signed)
Inpatient RehabilitationTeam Conference and Plan of Care Update Date: 09/22/2018   Time: 2:45 PM    Patient Name: Jeremiah [name]      Medical Record Number: [id number]  Date of Birth: 1945/07/24 Sex: Male         Room/Bed: 4W14C/4W14C-01 Payor Info: Payor: MEDICARE / Plan: MEDICARE PART A AND B / Product Type: *No Product type* /    Admitting Diagnosis: TBI Team  Lt CVA; non-traumatic ICH  Admit Date/Time:  09/21/2018  3:30 PM Admission Comments: No comment available   Primary Diagnosis:  <principal problem not specified> Principal Problem: <principal problem not specified>  Patient Active Problem List   Diagnosis Date Noted  . ICH (intracerebral hemorrhage) ([place]) 09/21/2018  . Delirium due to another medical condition, acute, hyperactive 09/14/2018  . Dementia ([place]) 09/14/2018  . Acute combined systolic and diastolic heart failure ([place]) 09/07/2018  . Acute renal failure with acute cortical necrosis ([place]) 09/07/2018  . Pulmonary embolism ([place])   . Intracerebral hemorrhage 08/31/2018  . Chronic rhinitis 03/02/2018  . Chronic cough 01/22/2017  . Constipation 01/01/2016  . Acute URI 03/13/2015  . Medicare annual wellness visit, subsequent 12/28/2014  . Essential hypertension 12/12/2014  . Generalized anxiety disorder 11/02/2014  . OSA (obstructive sleep apnea) 04/26/2014  . COPD, frequent exacerbations ([place]) 04/25/2013    Expected Discharge Date: Expected Discharge Date: 09/30/18  Team Members Present: Physician leading conference: Dr. Alger Simons Social Worker Present: Lennart Pall, LCSW Nurse Present: Other (comment)(Jamie Zenia Resides, RN) PT Present: Lavone Nian, PT OT Present: Darleen Crocker, OT SLP Present: Charolett Bumpers, SLP PPS Coordinator present : Gunnar Fusi     Current Status/Progress Goal Weekly Team Focus  Medical   left frontal hemorrhage. improving cognition, sleeping better, resolving delirium, chronic CHF  improve activity tolerance  volume mgt, behavior  mod   Bowel/Bladder   Pt is incontinent of B/B at this time. - Pt arrived to the unit with condom cath in place, it was removed and a brief was placed on the pt. Pt stated he was able to inform staff of the need to use the bathroom.   Timed toileting Q2H, with fewer episodes of incontinece.   Assess B/B needs    Swallow/Nutrition/ Hydration   dys3 and thin  Mod I  trials on regular    ADL's   min A overall with functional transfers ( HHA), Min A for UB and LB self care  supervision level goals  cognition/safety, endurance, strengthening, balance   Mobility   min assist overall without AD, short distance gait 2/2 fatigue, B rails needed for stair negotiation  supervision overall with LRAD except steps with min assist without rails  balance, NMR, cognitive remediation, transfers, gait, stairs, endurance   Communication             Safety/Cognition/ Behavioral Observations  Max A  Mod A  recall (memory notebook), basic problem solving, safety, sustained attention and orientation   Pain   Pt states he is pain free at this time.  Pain level <3  Assess pain Q shift/ PRN, medicate as needed   Skin   No signs of skin breakdown at this time.   Maintain skin integrity   Assess skin Qshift/PRN    Rehab Goals Patient on target to meet rehab goals: Yes *See Care Plan and progress notes for long and short-term goals.     Barriers to Discharge  Current Status/Progress Possible Resolutions Date Resolved   Physician  Nursing                  PT  Home environment access/layout;Behavior;Incontinence  2 STE without rails, impairec cognition              OT                  SLP                SW                Discharge Planning/Teaching Needs:  Home with family able to provide 24/7 assistance.  Teaching needs TBD.   Team Discussion:  New eval;  MD notes much better than when consulted on acute last week.  No c/o pain.  Confusion present and did poorly on MoCa.  Cont  b/b.  Min assist with mobility; amb ~ 35' and with supervision goals.  Min/ HHA with ADLs, balance.  Decreased memory, attention and safety awareness. SW to follow up with family about d/c plan.  Revisions to Treatment Plan:  NA    Continued Need for Acute Rehabilitation Level of Care: The patient requires daily medical management by a physician with specialized training in physical medicine and rehabilitation for the following conditions: Daily direction of a multidisciplinary physical rehabilitation program to ensure safe treatment while eliciting the highest outcome that is of practical value to the patient.: Yes Daily medical management of patient stability for increased activity during participation in an intensive rehabilitation regime.: Yes Daily analysis of laboratory values and/or radiology reports with any subsequent need for medication adjustment of medical intervention for : Post surgical problems;Neurological problems   I attest that I was present, lead the team conference, and concur with the assessment and plan of the team.   Donata Clay, Kaelob Persky 09/22/2018, 3:16 PM    Team conference was held via web/ teleconference due to [place] - 19

## 2018-09-22 NOTE — Progress Notes (Signed)
Physical Therapy Session Note  Patient Details  Name: Jeremiah [name] MRN: [id number] Date of Birth: 07/21/1945  Today's Date: 09/22/2018 PT Individual Time: 0945-1030 PT Individual Time Calculation (min): 45 min   Short Term Goals: Week 1:   see eval for details.  Skilled Therapeutic Interventions/Progress Updates:    Session focused on w/c propulsion for functional strengthening/cardiovascular endurance and functional mobility training with min assist for propulsion and turns with verbal cues for technique and little carryover. Pt with poor awareness to obstacles on R requiring min assist to navigate x 75' to fatigue. Short distance gait x 30' x 2 with overall min assist and noted narrow BOS, decreased step length, and decreased cadence. Functional transfers with CGA/min assist throughout session with assist for balance and weightshifting. Bed mobility on flat surface at supervision level to simulate home environment. Administered Berg Balance Test with results indicated below demonstrating high fall risk.   Therapy Documentation Precautions:  Restrictions Weight Bearing Restrictions: No Pain: Pain Assessment Pain Scale: 0-10 Pain Score: 2  Pain Location: Head Pain Orientation: Anterior  No intervention needed.  Locomotion : Architect: Yes Wheelchair Assistance: Minimal assistance - Patient >75% Wheelchair Propulsion: Both upper extremities Distance: 75'     Balance: Standardized Balance Assessment Standardized Balance Assessment: Financial controller Test Sit to Stand: Needs minimal aid to stand or to stabilize Standing Unsupported: Able to stand 2 minutes with supervision Sitting with Back Unsupported but Feet Supported on Floor or Stool: Able to sit 2 minutes under supervision Stand to Sit: Uses backs of legs against chair to control descent Transfers: Needs one person to assist Standing Unsupported with Eyes Closed: Able to stand 10  seconds with supervision Standing Ubsupported with Feet Together: Needs help to attain position and unable to hold for 15 seconds From Standing, Reach Forward with Outstretched Arm: Reaches forward but needs supervision From Standing Position, Pick up Object from Floor: Unable to pick up and needs supervision From Standing Position, Turn to Look Behind Over each Shoulder: Needs supervision when turning Turn 360 Degrees: Needs assistance while turning Standing Unsupported, Alternately Place Feet on Step/Stool: Able to complete >2 steps/needs minimal assist Standing Unsupported, One Foot in Front: Loses balance while stepping or standing Standing on One Leg: Unable to try or needs assist to prevent fall Total Score: 17    Therapy/Group: Individual Therapy  Canary Brim Ivory Broad, PT, DPT, CBIS  09/22/2018, 10:37 AM

## 2018-09-23 ENCOUNTER — Inpatient Hospital Stay (HOSPITAL_COMMUNITY): Payer: Medicare Other | Admitting: Physical Therapy

## 2018-09-23 ENCOUNTER — Inpatient Hospital Stay (HOSPITAL_COMMUNITY): Payer: Medicare Other

## 2018-09-23 ENCOUNTER — Inpatient Hospital Stay (HOSPITAL_COMMUNITY): Payer: Medicare Other | Admitting: Speech Pathology

## 2018-09-23 MED ORDER — IPRATROPIUM BROMIDE 0.02 % IN SOLN
0.5000 mg | Freq: Four times a day (QID) | RESPIRATORY_TRACT | Status: DC | PRN
  Filled 2018-09-23: qty 2.5

## 2018-09-23 NOTE — Progress Notes (Signed)
Speech Language Pathology Daily Session Note  Patient Details  Name: Jeremiah [name] MRN: [id number] Date of Birth: 10/26/1945  Today's Date: 09/23/2018 SLP Individual Time: 1330-1400 SLP Individual Time Calculation (min): 30 min  Short Term Goals: Week 1: SLP Short Term Goal 1 (Week 1): STG=LTG due to short ELOS  Skilled Therapeutic Interventions:  Skilled treatment session focused on cognition goals. SLP facilitiated session by providing Mod A cues for redirection to current task. Pt was perseveratively hyper-verbal/hyper-focused on topics regarding his grandchildren and despite Max A cues, pt speaks of being at [place] as a remote event indicating that he doesn't understand current situation. Redirection/education to current situation given but not retained. Pt pleasant throughout, left in bed, bed alarm on and all needs within reach. Continue per current plan of care.      Pain    Therapy/Group: Individual Therapy  Bertrand Vowels 09/23/2018, 1:57 PM

## 2018-09-23 NOTE — Plan of Care (Signed)
  Problem: Consults Goal: RH BRAIN INJURY PATIENT EDUCATION Description Description: See Patient Education module for eduction specifics Outcome: Progressing   Problem: RH BOWEL ELIMINATION Goal: RH STG MANAGE BOWEL WITH ASSISTANCE Description STG Manage Bowel with Assistance. Outcome: Progressing

## 2018-09-23 NOTE — Progress Notes (Signed)
Physical Therapy Session Note  Patient Details  Name: Jeremiah [name] MRN: [id number] Date of Birth: [birth year]  Today's Date: 09/23/2018 PT Individual Time: 1203-1229 PT Individual Time Calculation (min): 26 min   Short Term Goals: Week 1:  PT Short Term Goal 1 (Week 1): Pt will negotiate 2 steps without rails with LRAD with mod assist. PT Short Term Goal 2 (Week 1): Pt will ambulate 50 ft with LRAD & supervision.  Skilled Therapeutic Interventions/Progress Updates:  Pt received asleep in bed with covers pulled over head but awakened and agreeable to participate in treatment with encouragement. Pt transferred to sitting EOB with bed rails, bed flat & supervision & reports need to use restroom. Pt ambulates in room/bathroom without AD & min assist with therapist attempting to facilitate weight shifting L during LLE stance phase. Pt completes toilet transfer with min assist and has continent void on toilet. Pt requires cuing to locate soap on R side of sink & to use soap for hand hygiene while standing with min assist for balance. Pt ambulates room<>dayroom with seated rest break in dayroom with min assist & therapist facilitating weight shifting L with pt experiencing more LOB when therapist does this. In dayroom, pt engaged in toe taps to target on floor with min assist for balance with task focusing on weight shifting L, dynamic balance, and RLE coordination. At end of session pt left in w/c with chair alarm donned, call bell in reach, & set up with meal tray. Attempted to orient pt throughout session with pt able to recall he's in [facility] during session.  Therapy Documentation Precautions:  Precautions Precautions: Fall Precaution Comments: impulsive Restrictions Weight Bearing Restrictions: No   Therapy/Group: Individual Therapy  Waunita Schooner 09/23/2018, 12:37 PM

## 2018-09-23 NOTE — Progress Notes (Signed)
[place] PHYSICAL MEDICINE & REHABILITATION PROGRESS NOTE   Subjective/Complaints: No new issues this morning. Asleep when I came in.   ROS: Patient denies fever, rash, sore throat, blurred vision, nausea, vomiting, diarrhea, cough, shortness of breath or chest pain, joint or back pain, headache, or mood change.    Objective:   No results found. Recent Labs    09/22/18 0511  WBC 8.5  HGB 11.7*  HCT 35.4*  PLT 349   Recent Labs    09/21/18 0344 09/22/18 0511  NA 138 138  K 3.3* 3.4*  CL 107 104  CO2 25 25  GLUCOSE 115* 96  BUN 13 11  CREATININE 1.20 1.23  CALCIUM 8.8* 9.1    Intake/Output Summary (Last 24 hours) at 09/23/2018 1215 Last data filed at 09/23/2018 0900 Gross per 24 hour  Intake 356 ml  Output -  Net 356 ml     Physical Exam: Vital Signs Blood pressure (!) 151/85, pulse (!) 105, temperature 98.4 F (36.9 C), temperature source Oral, resp. rate 16, height 6' 1.5" (1.867 m), weight 72.1 kg, SpO2 93 %. Constitutional: No distress . Vital signs reviewed. HEENT: EOMI, oral membranes moist Neck: supple Cardiovascular: RRR without murmur. No JVD    Respiratory: CTA Bilaterally without wheezes or rales. Normal effort    GI: BS +, non-tender, non-distended  Neurological: Awakens easily. Follows direction. Fair insight. stm deficits RUE 4/5. LUE 5/5. RLE: 3/5 HF, KE and 4/5 ADF/PF. LLE: 4/5 HF, KE and 5/5 ADF/PF. No focal sensory deficits Skin: Skin iswarm.  Psychiatric: cooperative    Assessment/Plan: 1. Functional deficits secondary to left frontal hemorrhage which require 3+ hours per day of interdisciplinary therapy in a comprehensive inpatient rehab setting.  Physiatrist is providing close team supervision and 24 hour management of active medical problems listed below.  Physiatrist and rehab team continue to assess barriers to discharge/monitor patient progress toward functional and medical goals  Care Tool:  Bathing    Body parts bathed  by patient: Right arm, Left arm, Chest, Abdomen, Front perineal area, Buttocks, Right upper leg, Left upper leg, Right lower leg, Left lower leg, Face         Bathing assist Assist Level: Minimal Assistance - Patient > 75%     Upper Body Dressing/Undressing Upper body dressing   What is the patient wearing?: Button up shirt    Upper body assist Assist Level: Supervision/Verbal cueing    Lower Body Dressing/Undressing Lower body dressing      What is the patient wearing?: Underwear/pull up, Pants     Lower body assist Assist for lower body dressing: Contact Guard/Touching assist     Toileting Toileting    Toileting assist Assist for toileting: Contact Guard/Touching assist     Transfers Chair/bed transfer  Transfers assist     Chair/bed transfer assist level: Contact Guard/Touching assist     Locomotion Ambulation   Ambulation assist      Assist level: Minimal Assistance - Patient > 75% Assistive device: No Device Max distance: 100'   Walk 10 feet activity   Assist     Assist level: Minimal Assistance - Patient > 75% Assistive device: No Device   Walk 50 feet activity   Assist Walk 50 feet with 2 turns activity did not occur: Safety/medical concerns  Assist level: Minimal Assistance - Patient > 75% Assistive device: No Device    Walk 150 feet activity   Assist Walk 150 feet activity did not occur: Safety/medical concerns  Walk 10 feet on uneven surface  activity   Assist     Assist level: Moderate Assistance - Patient - 50 - 74% Assistive device: (none)   Wheelchair     Assist Will patient use wheelchair at discharge?: (community; likely primarily ambulatory) Type of Wheelchair: Manual    Wheelchair assist level: Minimal Assistance - Patient > 75%      Wheelchair 50 feet with 2 turns activity    Assist        Assist Level: Minimal Assistance - Patient > 75%   Wheelchair 150 feet activity      Assist Wheelchair 150 feet activity did not occur: Safety/medical concerns(endurance)        Medical Problem List and Plan: 1.Decreased functional mobility with cognitive deficitssecondary to left frontal hemorrhage secondary to hypertensive crisis and multiple medical issues --Continue CIR therapies including PT, OT, and SLP 2. Antithrombotics: -DVT/anticoagulation:SCDs. Venous Dopplers negative -antiplatelet therapy: aspirin 81 mg daily 3. Pain Management:Tylenol as needed 4. Mood:Provide emotional support -antipsychotic agents: Seroquel 50 mg twice daily -agitated behavior   improved.    -decreased seroquel to 25mg  6/9 5. Neuropsych: This patientis notcapable of making decisions on hisown behalf. 6. Skin/Wound Care:Routine skin checks 7. Fluids/Electrolytes/Nutrition:reasonable PO so far  -replete K+   8.History of COPD. Continue nebulizers. Followed by Dr. Chase Caller 9.Hypertension.Bidil20-37.5 mg 3 times daily 10.Acute diastolic congestive heart failure. Lasix 20 mg daily. Monitor for any signs of fluid overload -daily weights stable   Filed Weights   09/21/18 1545 09/23/18 0316 09/23/18 0500  Weight: 72.1 kg 72.1 kg 72.1 kg    11.Small left lower lobe pulmonary embolism identified on CT angiogram of the chest 08/31/2018. No obvious signs of hemodynamic instability. No plans for anticoagulation.    LOS: 2 days A FACE TO FACE EVALUATION WAS PERFORMED  Meredith Staggers 09/23/2018, 12:15 PM

## 2018-09-23 NOTE — Progress Notes (Signed)
Occupational Therapy Session Note  Patient Details  Name: Jeremiah [name] MRN: [id number] Date of Birth: 10/29/1945  Today's Date: 09/23/2018 OT Individual Time: 2549-8264 OT Individual Time Calculation (min): 75 min    Short Term Goals: Week 1:  OT Short Term Goal 1 (Week 1): STGs=LTGs secondary to short LOS  Skilled Therapeutic Interventions/Progress Updates:    Pt received supine with blanket pulled over head, responsive to verbal cues immediately. No c/o pain. Pt transitioned to EOB with no cues and reported needing to use bathroom. He initially attempted to use urinal in standing, with CGA to stand. He then asked to sit on toilet instead and completed ambulatory transfer into bathroom with CGA with no AD. Pt sat for several minutes with urine void and very small BM. Started shower in hopes of initiating transfer off toilet but pt politely requested to continue sitting. Pt requiring frequent orientation cues throughout session, as he perseverated on going to [place] and possibly hallucinating or just talking about a "little puppy dog running around". With max cueing and timer set pt able to transfer off toilet and into shower. Almost immediately pt became SOB and panicked about being able to breath. With HHA he transferred out of shower and to EOB where he then transitioned into tall kneeling EOB and found an inhaler which he took 2 puffs of. Unclear if this kneeling is a strategy pt used at home when SOB because he did this 2 more times during session. Pt donned pants EOB with (S). Pt transitioned into w/c where breakfast was set up for him. He ate with good use of swallow/eating strategies. RT entered to administer breathing treatment and discussed pt breathing strategies. Pt left sitting in w/c with chair alarm belt fastened, telesitter present. NT Kasey notified of intermittent (S) needed for eating breakfast.   Therapy Documentation Precautions:  Precautions Precautions: Fall Precaution  Comments: impulsive Restrictions Weight Bearing Restrictions: No   Therapy/Group: Individual Therapy  Curtis Sites 09/23/2018, 7:08 AM

## 2018-09-23 NOTE — Progress Notes (Signed)
Physical Therapy Session Note  Patient Details  Name: Jeremiah [name] MRN: [id number] Date of Birth: [birth year]  Today's Date: 09/23/2018 PT Individual Time: 8295-6213 PT Individual Time Calculation (min): 57 min   Short Term Goals: Week 1:  PT Short Term Goal 1 (Week 1): Pt will negotiate 2 steps without rails with LRAD with mod assist. PT Short Term Goal 2 (Week 1): Pt will ambulate 50 ft with LRAD & supervision.  Skilled Therapeutic Interventions/Progress Updates:   Pt in w/c and agreeable to therapy, no c/o pain. Pt self-propelled w/c to therapy gym w/ BUEs to work on coordination. Self-propelled 125' w/ supervision, min-mod verbal, visual, and tactile cues for technique, especially w/ turns and R environmental awareness. Pt w/ tendency to steer to R and ignore R environment. Ambulated 100' x3 w/ CGA-min assist w/o AD, verbal cues for gait pattern and to decrease shuffling. Worked on standing balance and cognitive remediation in between gait bouts. Stood to perform peg board task and play checkers. Stood 3-5 min at a time, multiple bouts, w/ CGA. Min verbal cues for problem solving, attention to task, and R visual scanning/awareness. Added in collecting bean bags in hallway at various heights during 2nd and 3rd walk to work on R environmental awareness and dynamic balance w/ reaching during gait. Ambulated back to room and ended session in supine, all needs in reach.   Therapy Documentation Precautions:  Precautions Precautions: Fall Precaution Comments: impulsive Restrictions Weight Bearing Restrictions: No Vital Signs: Oxygen Therapy SpO2: 93 % O2 Device: Room Air Pain: Pain Assessment Pain Scale: 0-10 Pain Score: 0-No pain  Therapy/Group: Individual Therapy  Lajean Boese Clent Demark 09/23/2018, 10:44 AM

## 2018-09-24 ENCOUNTER — Inpatient Hospital Stay (HOSPITAL_COMMUNITY): Payer: Medicare Other | Admitting: Speech Pathology

## 2018-09-24 ENCOUNTER — Inpatient Hospital Stay (HOSPITAL_COMMUNITY): Payer: Medicare Other

## 2018-09-24 MED ORDER — QUETIAPINE FUMARATE 50 MG PO TABS
50.0000 mg | ORAL_TABLET | Freq: Every day | ORAL | Status: DC
  Administered 2018-09-25 – 2018-09-29 (×5): 50 mg via ORAL
  Filled 2018-09-24 (×2): qty 1
  Filled 2018-09-24: qty 2
  Filled 2018-09-24: qty 1
  Filled 2018-09-24: qty 2

## 2018-09-24 NOTE — IPOC Note (Signed)
Overall Plan of Care [facility]) Patient Details Name: Jeremiah [name] MRN: [id number] DOB: 21-Aug-1945  Admitting Diagnosis: <principal problem not specified>  Hospital Problems: Active Problems:   ICH (intracerebral hemorrhage) ([place])     Functional Problem List: Nursing Safety, Bladder, Bowel  PT Balance, Behavior, Pain, Endurance, Safety, Perception, Sensory  OT Balance, Cognition, Endurance, Motor, Pain, Safety  SLP Cognition  TR         Basic ADL's: OT Grooming, Bathing, Dressing, Toileting     Advanced  ADL's: OT       Transfers: PT Bed Mobility, Car, Bed to Chair, [employer], Futures trader, Tub/Shower     Locomotion: PT Ambulation, Emergency planning/management officer, Stairs     Additional Impairments: OT None  SLP Swallowing, Social Cognition   Problem Solving, Memory, Awareness, Attention  TR      Anticipated Outcomes Item Anticipated Outcome  Self Feeding n/a  Swallowing  Mod I   Basic self-care  supervision  Toileting  supervision   Bathroom Transfers supervision  Bowel/Bladder  Maintain bowel & bladder with min assistance.  Transfers  supervision with LRAD  Locomotion  supervision with LRAD except min assist 2 steps without rails with LRAD  Communication     Cognition  Mod A  Pain  Pain level <3.   Safety/Judgment  No falls the remainder of this admission.   Therapy Plan: PT Intensity: Minimum of 1-2 x/day ,45 to 90 minutes PT Frequency: 5 out of 7 days PT Duration Estimated Length of Stay: 10-12 days OT Intensity: Minimum of 1-2 x/day, 45 to 90 minutes OT Frequency: 5 out of 7 days OT Duration/Estimated Length of Stay: 7-10 days SLP Intensity: Minumum of 1-2 x/day, 30 to 90 minutes SLP Frequency: 3 to 5 out of 7 days SLP Duration/Estimated Length of Stay: 6/17   Due to the current state of emergency, patients may not be receiving their 3-hours of Medicare-mandated therapy.   Team Interventions: Nursing Interventions Patient/Family Education, Pain  Management, Bladder Management, Bowel Management, Skin Care/Wound Management  PT interventions Ambulation/gait training, Cognitive remediation/compensation, Discharge planning, DME/adaptive equipment instruction, Functional mobility training, Pain management, Psychosocial support, Splinting/orthotics, Therapeutic Activities, UE/LE Strength taining/ROM, Visual/perceptual remediation/compensation, Wheelchair propulsion/positioning, UE/LE Coordination activities, Therapeutic Exercise, Skin care/wound management, Stair training, Patient/family education, Neuromuscular re-education, Functional electrical stimulation, Disease management/prevention, Academic librarian, Training and development officer  OT Interventions Training and development officer, Self Care/advanced ADL retraining, Therapeutic Exercise, Cognitive remediation/compensation, DME/adaptive equipment instruction, UE/LE Strength taining/ROM, Academic librarian, Barrister's clerk education, UE/LE Coordination activities, Discharge planning, Functional mobility training, Psychosocial support, Therapeutic Activities  SLP Interventions Cognitive remediation/compensation, Cueing hierarchy, Dysphagia/aspiration precaution training, Functional tasks, Internal/external aids  TR Interventions    SW/CM Interventions Discharge Planning, Psychosocial Support, Patient/Family Education   Barriers to Discharge MD  Medical stability  Nursing      PT Home environment access/layout, Behavior, Incontinence 2 STE without rails, impairec cognition  OT Other (comments) none known at this time  SLP      SW       Team Discharge Planning: Destination: PT-Home ,OT- Home , SLP-Home Projected Follow-up: PT-Home health PT, 24 hour supervision/assistance, OT-  Outpatient OT, SLP-Home Health SLP, 24 hour supervision/assistance Projected Equipment Needs: PT-To be determined, OT- To be determined, SLP-None recommended by SLP Equipment Details: PT- , OT-  Patient/family  involved in discharge planning: PT- Patient unable/family or caregiver not available,  OT-Patient, SLP-Patient  MD ELOS: 7-10 days Medical Rehab Prognosis:  Excellent Assessment: The patient has been admitted for CIR therapies with the diagnosis of ICH.  The team will be addressing functional mobility, strength, stamina, balance, safety, adaptive techniques and equipment, self-care, bowel and bladder mgt, patient and caregiver education, NMR, cognition, community reentry. Goals have been set at supervision for self-care and mobility and min to mod assist with cognition.   Due to the current state of emergency, patients may not be receiving their 3 hours per day of Medicare-mandated therapy.    Meredith Staggers, MD, FAAPMR      See Team Conference Notes for weekly updates to the plan of care

## 2018-09-24 NOTE — Progress Notes (Signed)
[place] PHYSICAL MEDICINE & REHABILITATION PROGRESS NOTE   Subjective/Complaints: Struggled sleeping last night. Very restless. Feels tired as a result this AM  ROS: Patient denies fever, rash, sore throat, blurred vision, nausea, vomiting, diarrhea, cough, shortness of breath or chest pain, joint or back pain, headache, or mood change.   Objective:   No results found. Recent Labs    09/22/18 0511  WBC 8.5  HGB 11.7*  HCT 35.4*  PLT 349   Recent Labs    09/22/18 0511  NA 138  K 3.4*  CL 104  CO2 25  GLUCOSE 96  BUN 11  CREATININE 1.23  CALCIUM 9.1    Intake/Output Summary (Last 24 hours) at 09/24/2018 1209 Last data filed at 09/24/2018 0742 Gross per 24 hour  Intake 358 ml  Output 4 ml  Net 354 ml     Physical Exam: Vital Signs Blood pressure 108/75, pulse 93, temperature 98.6 F (37 C), temperature source Oral, resp. rate 20, height 6' 1.5" (1.867 m), weight 72.1 kg, SpO2 91 %. Constitutional: No distress . Vital signs reviewed. HEENT: EOMI, oral membranes moist Neck: supple Cardiovascular: RRR without murmur. No JVD    Respiratory: CTA Bilaterally without wheezes or rales. Normal effort    GI: BS +, non-tender, non-distended  Neurological: Alert, follows direction. Fair insight and awareness. Oriented to person, place, reason. RUE 4/5. LUE 5/5. RLE: 3/5 HF, KE and 4/5 ADF/PF. LLE: 4/5 HF, KE and 5/5 ADF/PF. No focal sensory deficits Skin: Skin iswarm.  Psychiatric: cooperative    Assessment/Plan: 1. Functional deficits secondary to left frontal hemorrhage which require 3+ hours per day of interdisciplinary therapy in a comprehensive inpatient rehab setting.  Physiatrist is providing close team supervision and 24 hour management of active medical problems listed below.  Physiatrist and rehab team continue to assess barriers to discharge/monitor patient progress toward functional and medical goals  Care Tool:  Bathing    Body parts bathed by  patient: Right arm, Left arm, Chest, Abdomen, Front perineal area, Buttocks, Right upper leg, Left upper leg, Right lower leg, Left lower leg, Face         Bathing assist Assist Level: Minimal Assistance - Patient > 75%     Upper Body Dressing/Undressing Upper body dressing   What is the patient wearing?: Pull over shirt    Upper body assist Assist Level: Supervision/Verbal cueing    Lower Body Dressing/Undressing Lower body dressing      What is the patient wearing?: Underwear/pull up, Pants     Lower body assist Assist for lower body dressing: Minimal Assistance - Patient > 75%     Toileting Toileting    Toileting assist Assist for toileting: Contact Guard/Touching assist     Transfers Chair/bed transfer  Transfers assist     Chair/bed transfer assist level: Contact Guard/Touching assist     Locomotion Ambulation   Ambulation assist      Assist level: Minimal Assistance - Patient > 75% Assistive device: No Device Max distance: 100'   Walk 10 feet activity   Assist     Assist level: Minimal Assistance - Patient > 75% Assistive device: No Device   Walk 50 feet activity   Assist Walk 50 feet with 2 turns activity did not occur: Safety/medical concerns  Assist level: Minimal Assistance - Patient > 75% Assistive device: No Device    Walk 150 feet activity   Assist Walk 150 feet activity did not occur: Safety/medical concerns  Walk 10 feet on uneven surface  activity   Assist     Assist level: Moderate Assistance - Patient - 50 - 74% Assistive device: (none)   Wheelchair     Assist Will patient use wheelchair at discharge?: (community; likely primarily ambulatory) Type of Wheelchair: Manual    Wheelchair assist level: Minimal Assistance - Patient > 75%      Wheelchair 50 feet with 2 turns activity    Assist        Assist Level: Minimal Assistance - Patient > 75%   Wheelchair 150 feet activity      Assist Wheelchair 150 feet activity did not occur: Safety/medical concerns(endurance)        Medical Problem List and Plan: 1.Decreased functional mobility with cognitive deficitssecondary to left frontal hemorrhage secondary to hypertensive crisis and multiple medical issues --Continue CIR therapies including PT, OT, and SLP  2. Antithrombotics: -DVT/anticoagulation:SCDs. Venous Dopplers negative -antiplatelet therapy: aspirin 81 mg daily 3. Pain Management:Tylenol as needed 4. Mood:Provide emotional support -antipsychotic agents: difficulty sleeping last night  -seroquel recently decreased.   -will dc am seroquel but increase HS dose to 50mg  5. Neuropsych: This patientis notcapable of making decisions on hisown behalf. 6. Skin/Wound Care:Routine skin checks  7. Fluids/Electrolytes/Nutrition:reasonable PO so far  -repleting K+   8.History of COPD. Continue nebulizers. Followed by Dr. Chase Caller 9.Hypertension.Bidil20-37.5 mg 3 times daily 10.Acute diastolic congestive heart failure. Lasix 20 mg daily. Monitor for any signs of fluid overload -daily weights stable at present   [facility] Weights   09/23/18 0316 09/23/18 0500 09/24/18 0433  Weight: 72.1 kg 72.1 kg 72.1 kg    11.Small left lower lobe pulmonary embolism identified on CT angiogram of the chest 08/31/2018. No obvious signs of hemodynamic instability. No plans for anticoagulation.    LOS: 3 days A FACE TO FACE EVALUATION WAS PERFORMED  Meredith Staggers 09/24/2018, 12:09 PM

## 2018-09-24 NOTE — Progress Notes (Signed)
Physical Therapy Session Note  Patient Details  Name: Jeremiah [name] MRN: [id number] Date of Birth: 15-Feb-1946  Today's Date: 09/24/2018 PT Individual Time: 1330-1430 PT Individual Time Calculation (min): 60 min   Short Term Goals: Week 1:  PT Short Term Goal 1 (Week 1): Pt will negotiate 2 steps without rails with LRAD with mod assist. PT Short Term Goal 2 (Week 1): Pt will ambulate 50 ft with LRAD & supervision.  Skilled Therapeutic Interventions/Progress Updates:     Patient in w/c finishing lunch upon PT arrival. Patient alert and agreeable to PT session. Patient had questions about his diagnosis and when he was going home this afternoon. PT educated patient on POC and ELOS, re-oriented patient and discussed current diagnoses, symptoms including attention and memory deficits, and goals for PT.   Therapeutic Activity: Bed Mobility: Patient performed sit to supine with supervision for safety. Transfers: Patient performed sit to/from stand x4 and a stand pivot x1 with CGA for safety and balance. Provided verbal cues for reaching back before sitting for safety.  Gait Training:  Patient ambulated 50 feet x1 using no AD with CGA, RPE 6/10 after. Ambulated with significantly decreased gait speed, decreased B step length, decreased trunk rotation and arm swing, intermittent foot flat at initial contact, and downward head gaze. Provided verbal cues for looking ahead, increased arm swing for improved balance and trunk rotation, and increased step length. Patient performed B step taps on a 6" single step x10 for pre-gait stair training and endurance. He then went up/down a single 6" step with B UE support x1 leading with each foot with CGA, with single UE support x1 leading with each foot with CGA, and with light touch HHA x2 leading with each foot with CGA-min A for HHA with mild SOB after. He was provided a sitting rest break, O2 97% RPE 7/10. He then went up/down the single 6" step with light touch  HHA x3 with each foot with CGA-min A for HHA, mild SOB after, O2 94% and RPE 6/10. Provided cues for pursed lip breathing for SOB and discussed paced breathing with activity and energy conservation techniques. Patient responded well to all education and use of the 10 point RPE scale. Will need reinforcement of education and use of 10 point RPE scale due to cognitive deficits.   Wheelchair Mobility:  Patient propelled wheelchair 50 feet with supervision using B UEs and LEs for increased activity tolerance, RPE 4/10 after. Provided verbal cues for use of LEs and turning technique.   Patient in bed at end of session with breaks locked, bed alarm set, and all needs within reach.    Therapy Documentation Precautions:  Precautions Precautions: Fall Precaution Comments: impulsive Restrictions Weight Bearing Restrictions: No Pain: Patient denied pain throughout session.   Therapy/Group: Individual Therapy  Kama Cammarano L Apoorva Bugay PT, DPT  09/24/2018, 3:15 PM

## 2018-09-24 NOTE — Progress Notes (Signed)
Occupational Therapy Session Note  Patient Details  Name: Jeremiah [name] MRN: [id number] Date of Birth: 1945-05-26  Today's Date: 09/24/2018 OT Individual Time: 6606-0045 OT Individual Time Calculation (min): 71 min    Short Term Goals: Week 1:  OT Short Term Goal 1 (Week 1): STGs=LTGs secondary to short LOS  Skilled Therapeutic Interventions/Progress Updates:     1;1. Pt received in bed with RT present providing breathing treatment. Pt declines bathing and dressing this morning, but sits EOB with set up for breakfast. Pt and OT discuss mood and anxiety as pt states he feels nervous and frequently looks to "shadows of feet walking outside my door." Ot provides support and therapuetic listening andpt agrees he would like to talk to the neuropsychologist about mood and anxiety. Will follow up with CSW. Pt grroms at sink in standing with supervision for static standing. Pt completes dynavision in standing with S-CGA on standard tile and compliant surface. Pt demo 2.0-2.5 seconds reaction time in all 4 quadrants, however when task was modified for more cognitive component (right hand used to select red stimulus, L hand for green stimulus) pt unable to complete. Pt stands at high low table to place colored ping pong balls into egg carton based on figure with mod VC for simple pattern. Exited session with tp seated in bed, exit alarm on and call light in reach  Therapy Documentation Precautions:  Precautions Precautions: Fall Precaution Comments: impulsive Restrictions Weight Bearing Restrictions: No General:   Vital Signs: Therapy Vitals Pulse Rate: 93 Resp: 20 Patient Position (if appropriate): Standing Oxygen Therapy SpO2: 91 % O2 Device: Room Air O2 Flow Rate (L/min): 0 L/min Pain:   ADL:   Vision   Perception    Praxis   Exercises:   Other Treatments:     Therapy/Group: Individual Therapy  Tonny Branch 09/24/2018, 8:48 AM

## 2018-09-24 NOTE — Plan of Care (Signed)
  Problem: Consults Goal: RH BRAIN INJURY PATIENT EDUCATION Description: Description: See Patient Education module for eduction specifics Outcome: Progressing Goal: RH BRAIN INJURY PATIENT EDUCATION Description: Description: See Patient Education module for eduction specifics Outcome: Progressing   Problem: RH BOWEL ELIMINATION Goal: RH STG MANAGE BOWEL WITH ASSISTANCE Description: STG Manage Bowel with Assistance. Outcome: Progressing Goal: RH STG MANAGE BOWEL W/MEDICATION W/ASSISTANCE Description: STG Manage Bowel with Medication with Assistance. Outcome: Progressing   Problem: RH BLADDER ELIMINATION Goal: RH STG MANAGE BLADDER WITH ASSISTANCE Description: STG Manage Bladder With Assistance Outcome: Progressing   Problem: RH SKIN INTEGRITY Goal: RH STG SKIN FREE OF INFECTION/BREAKDOWN Outcome: Progressing Goal: RH STG MAINTAIN SKIN INTEGRITY WITH ASSISTANCE Description: STG Maintain Skin Integrity With Assistance. Outcome: Progressing   Problem: RH SAFETY Goal: RH STG ADHERE TO SAFETY PRECAUTIONS W/ASSISTANCE/DEVICE Description: STG Adhere to Safety Precautions With Assistance/Device. Outcome: Progressing   Problem: RH COGNITION-NURSING Goal: RH STG USES MEMORY AIDS/STRATEGIES W/ASSIST TO PROBLEM SOLVE Description: STG Uses Memory Aids/Strategies With Assistance to Problem Solve. Outcome: Progressing Goal: RH STG ANTICIPATES NEEDS/CALLS FOR ASSIST W/ASSIST/CUES Description: STG Anticipates Needs/Calls for Assist With Assistance/Cues. Outcome: Progressing   Problem: RH PAIN MANAGEMENT Goal: RH STG PAIN MANAGED AT OR BELOW PT'S PAIN GOAL Outcome: Progressing   Problem: RH KNOWLEDGE DEFICIT BRAIN INJURY Goal: RH STG INCREASE KNOWLEDGE OF SELF CARE AFTER BRAIN INJURY Outcome: Progressing

## 2018-09-24 NOTE — Progress Notes (Signed)
Speech Language Pathology Daily Session Note  Patient Details  Name: Jeremiah [name] MRN: [id number] Date of Birth: 1946-01-23  Today's Date: 09/24/2018 SLP Individual Time: 1030-1130 SLP Individual Time Calculation (min): 60 min  Short Term Goals: Week 1: SLP Short Term Goal 1 (Week 1): STG=LTG due to short ELOS  Skilled Therapeutic Interventions:  Skilled treatment session focused on cognition goals. SLP facilitated session by providing CGA to ambulate to bathroom and for hygiene at sink. Pt with fatigued after standing for bathroom and sink. He didn't demonstrate any insight into fatigue but SLP placed wheelchair behind pt for energy conservation. SLP further facilitated session by providing Mod A to sort colored beads. Pt required frequent reminders to recall what color he was asked to sort. Pt with good sustained attention to task but difficulty completing task d/t errors in memory and problem solving. Pt left upright in his wheelchair, lap belt alarm on and all needs within reach. Continue per current plan of care.      Pain    Therapy/Group: Individual Therapy  Tashawn Laswell 09/24/2018, 12:53 PM

## 2018-09-25 ENCOUNTER — Inpatient Hospital Stay (HOSPITAL_COMMUNITY): Payer: Medicare Other

## 2018-09-25 ENCOUNTER — Inpatient Hospital Stay (HOSPITAL_COMMUNITY): Payer: Medicare Other | Admitting: Physical Therapy

## 2018-09-25 MED ORDER — PANTOPRAZOLE SODIUM 40 MG PO TBEC
40.0000 mg | DELAYED_RELEASE_TABLET | Freq: Every day | ORAL | Status: DC
  Administered 2018-09-26 – 2018-09-30 (×5): 40 mg via ORAL
  Filled 2018-09-25 (×5): qty 1

## 2018-09-25 NOTE — Progress Notes (Signed)
Pt stated " I'm so hot" pt was SOB and anxious. Covers and shirt and pants removed pt's heat turned down in room. Relaxation and breathing techniques done with pt. Pt given Brovana as scheduled. Pt is breathing better still some SOB lungs are clear but remain diminshed. Pt states feels " a little better".

## 2018-09-25 NOTE — Progress Notes (Signed)
Social Work Patient ID: Jeremiah [name], male   DOB: 05-17-1945, 73 y.o.   MRN: [id number]   Have reviewed team conference with pt and wife.  Both aware and agreeable with targeted d/c date of 6/17 and supervision goals overall.    Justyn Langham, LCSW

## 2018-09-25 NOTE — Progress Notes (Signed)
[place] PHYSICAL MEDICINE & REHABILITATION PROGRESS NOTE   Subjective/Complaints: Pt states he slept much better last night. Appreciative of med change.   ROS: Patient denies fever, rash, sore throat, blurred vision, nausea, vomiting, diarrhea, cough, shortness of breath or chest pain, joint or back pain, headache, or mood change.   Objective:   No results found. No results for input(s): WBC, HGB, HCT, PLT in the last 72 hours. No results for input(s): NA, K, CL, CO2, GLUCOSE, BUN, CREATININE, CALCIUM in the last 72 hours.  Intake/Output Summary (Last 24 hours) at 09/25/2018 1117 Last data filed at 09/25/2018 0900 Gross per 24 hour  Intake 360 ml  Output 1 ml  Net 359 ml     Physical Exam: Vital Signs Blood pressure 131/80, pulse 91, temperature 97.7 F (36.5 C), temperature source Oral, resp. rate 19, height 6' 1.5" (1.867 m), weight 73.5 kg, SpO2 96 %. Constitutional: No distress . Vital signs reviewed. HEENT: EOMI, oral membranes moist Neck: supple Cardiovascular: RRR without murmur. No JVD    Respiratory: CTA Bilaterally without wheezes or rales. Normal effort    GI: BS +, non-tender, non-distended  Neurological: Alert, follows direction. Fair insight and awareness. STM deficits.  Oriented to person, place, reason. RUE 4/5. LUE 5/5. RLE: 3/5 HF, KE and 4/5 ADF/PF. LLE: 4/5 HF, KE and 5/5 ADF/PF. No focal sensory deficits Skin: Skin iswarm.  Psychiatric: pleasant and cooperative    Assessment/Plan: 1. Functional deficits secondary to left frontal hemorrhage which require 3+ hours per day of interdisciplinary therapy in a comprehensive inpatient rehab setting.  Physiatrist is providing close team supervision and 24 hour management of active medical problems listed below.  Physiatrist and rehab team continue to assess barriers to discharge/monitor patient progress toward functional and medical goals  Care Tool:  Bathing    Body parts bathed by patient: Right  arm, Left arm, Chest, Abdomen, Front perineal area, Buttocks, Right upper leg, Left upper leg, Right lower leg, Left lower leg, Face         Bathing assist Assist Level: Minimal Assistance - Patient > 75%     Upper Body Dressing/Undressing Upper body dressing   What is the patient wearing?: Pull over shirt    Upper body assist Assist Level: Supervision/Verbal cueing    Lower Body Dressing/Undressing Lower body dressing      What is the patient wearing?: Underwear/pull up, Pants     Lower body assist Assist for lower body dressing: Minimal Assistance - Patient > 75%     Toileting Toileting    Toileting assist Assist for toileting: Contact Guard/Touching assist     Transfers Chair/bed transfer  Transfers assist     Chair/bed transfer assist level: Contact Guard/Touching assist     Locomotion Ambulation   Ambulation assist      Assist level: Contact Guard/Touching assist Assistive device: No Device Max distance: 50'   Walk 10 feet activity   Assist     Assist level: Contact Guard/Touching assist Assistive device: No Device   Walk 50 feet activity   Assist Walk 50 feet with 2 turns activity did not occur: Safety/medical concerns  Assist level: Contact Guard/Touching assist Assistive device: No Device    Walk 150 feet activity   Assist Walk 150 feet activity did not occur: Safety/medical concerns         Walk 10 feet on uneven surface  activity   Assist     Assist level: Moderate Assistance - Patient - 50 - 74%  Assistive device: (none)   Wheelchair     Assist Will patient use wheelchair at discharge?: (community; likely primarily ambulatory) Type of Wheelchair: Manual    Wheelchair assist level: Supervision/Verbal cueing Max wheelchair distance: 50'    Wheelchair 50 feet with 2 turns activity    Assist        Assist Level: Supervision/Verbal cueing   Wheelchair 150 feet activity     Assist Wheelchair 150  feet activity did not occur: Safety/medical concerns(endurance)        Medical Problem List and Plan: 1.Decreased functional mobility with cognitive deficitssecondary to left frontal hemorrhage secondary to hypertensive crisis and multiple medical issues --Continue CIR therapies including PT, OT, and SLP  2. Antithrombotics: -DVT/anticoagulation:SCDs. Venous Dopplers negative -antiplatelet therapy: aspirin 81 mg daily 3. Pain Management:Tylenol as needed 4. Mood:Provide emotional support -antipsychotic agents: continue seroquel 50mg  qhs    5. Neuropsych: This patientis notcapable of making decisions on hisown behalf. 6. Skin/Wound Care:Routine skin checks  7. Fluids/Electrolytes/Nutrition:reasonable PO so far  -repleting K+   8.History of COPD. Continue nebulizers. Followed by Dr. Chase Caller 9.Hypertension.Bidil20-37.5 mg 3 times daily 10.Acute diastolic congestive heart failure. Lasix 20 mg daily. Monitor for any signs of fluid overload -daily weights stable at present   [facility] Weights   09/23/18 0500 09/24/18 0433 09/25/18 0500  Weight: 72.1 kg 72.1 kg 73.5 kg    11.Small left lower lobe pulmonary embolism identified on CT angiogram of the chest 08/31/2018. No obvious signs of hemodynamic instability. No plans for anticoagulation.    LOS: 4 days A FACE TO FACE EVALUATION WAS PERFORMED  Meredith Staggers 09/25/2018, 11:17 AM

## 2018-09-25 NOTE — Progress Notes (Signed)
Physical Therapy Session Note  Patient Details  Name: Jeremiah [name] MRN: [id number] Date of Birth: 18-Jun-1945  Today's Date: 09/25/2018 PT Individual Time: 1115-5208 PT Individual Time Calculation (min): 79 min   Short Term Goals: Week 1:  PT Short Term Goal 1 (Week 1): Pt will negotiate 2 steps without rails with LRAD with mod assist. PT Short Term Goal 2 (Week 1): Pt will ambulate 50 ft with LRAD & supervision.  Skilled Therapeutic Interventions/Progress Updates:    Pt received supine in bed speaking with a friend on the telephone and pt agreeable to therapy session. Supine>sit with supervision. Sit<>stand, no AD, with close supervision/CGA throughout session. Stand pivot transfer EOB>w/c, no AD, with CGA for steadying. Pt requesting to blow nose and ambulated ~78ft, no AD, to/from bathroom, reached down and grabbed toilet paper, and performed hand hygiene at sink all with CGA for steadying.  Transported to/from gym in w/c. Ambulated 13ft, no AD, x2 with CGA for steadying - demonstrates slightly wider BOS, short B LE step length, and increased postural sway. Pt able to recall that he is to perform pursed lip breathing during activity without cuing. Performed alternate B LE cone taps with CGA for steadying progressed to 1/2 circle cone taps on verbal command with min assist/CGA. Performed lateral side stepping and forward/backwards stepping over tape on floor with CGA for steadying throughout. Performed 4 square stepping around tape on floor with CGA for steadying and min cuing for sequencing. Pt impulsively standing to go grab tissues. Pt ambulated ~64ft x2, no AD, with CGA for steadying focusing on increased activity tolerance. Performed dynamic standing balance task of standing on airex while playing corn hole with min assist for balance and 2x minor posterior and anterior LOB. Pt extensively educated on need to have staff assist with all standing and ambulatory tasks. Toward end of session pt  initiated standing and then stopped and recalled earlier instructions to wait for assist and returned to sitting on EOM. Stand pivot transfer EOM>w/c with CGA/close supervision. Transported back to room in w/c. Ambulated ~72ft, no AD, with CGA for steadying to sit in recliner. Pt left sitting in recliner with needs in reach, seat belt alarm on, telesitter in place, and meal tray set-up.    Therapy Documentation Precautions:  Precautions Precautions: Fall Precaution Comments: impulsive Restrictions Weight Bearing Restrictions: No  Pain:  Denies pain during therapy session.   Therapy/Group: Individual Therapy  Tawana Scale, PT, DPT 09/25/2018, 4:43 PM

## 2018-09-25 NOTE — Progress Notes (Signed)
Occupational Therapy Session Note  Patient Details  Name: Jeremiah [name] MRN: [id number] Date of Birth: 12/24/1945  Today's Date: 09/25/2018 OT Individual Time: 1030-1130 OT Individual Time Calculation (min): 60 min    Short Term Goals: Week 1:  OT Short Term Goal 1 (Week 1): STGs=LTGs secondary to short LOS  Skilled Therapeutic Interventions/Progress Updates:    Pt received supine in bed, pleasant with no c/o pain. Pt completed functional mobility into bathroom with CGA. Pt required tactile and verbal cues for diaphragmatic breathing d/t SOB. Pt completed bathing in shower with cueing required to sit down for LB and for energy conservation. Pt transferred out of shower and back to EOB when RT entered room to administer breathing treatment. Pt donned shirt and pants with (S) from EOB. Pt completed foot hygiene and donned socks using figure 4 method EOB. Pt completed 200 ft of functional mobility with 2 standing rest breaks. Pt left sitting up in recliner with telesitter prssent, all needs within reach.   Therapy Documentation Precautions:  Precautions Precautions: Fall Precaution Comments: impulsive Restrictions Weight Bearing Restrictions: No   Therapy/Group: Individual Therapy  Curtis Sites 09/25/2018, 11:15 AM

## 2018-09-25 NOTE — Progress Notes (Signed)
Speech Language Pathology Daily Session Note  Patient Details  Name: Jeremiah [name] MRN: [id number] Date of Birth: [birth year]  Today's Date: 09/25/2018 SLP Individual Time: 5102-5852 SLP Individual Time Calculation (min): 45 min  Short Term Goals: Week 1: SLP Short Term Goal 1 (Week 1): STG=LTG due to short ELOS  Skilled Therapeutic Interventions: Skilled ST services focused on cognitive skills. SLP facilitated basic problem solving skills utilizing card sorting task by a field of 6 colors required min A verbal cues, however when moving to sorting by shape in a field of 6 pt required initially max A verbal cues fade to mod A verbal cues (1/2 through task.) Pt required moderate cues for recall of method of sorting on second task. SLP initiated memory notebook to aid in short term recall and provided instruction. Pt demonstrated ability to recall ST events from treatment session with min A verbal cues. Pt was orientated to place and time with min A verbal cues for visual aid in the beginning of the session fading to supervision A at the end of the session. Pt was left in room with call bell within reach and bed alarm set. ST recommends to continue skilled ST services.      Pain Pain Assessment Pain Score: 0-No pain  Therapy/Group: Individual Therapy  Amiere Cawley  [facility] 09/25/2018, 12:27 PM

## 2018-09-26 ENCOUNTER — Inpatient Hospital Stay (HOSPITAL_COMMUNITY): Payer: Medicare Other | Admitting: Occupational Therapy

## 2018-09-26 ENCOUNTER — Inpatient Hospital Stay (HOSPITAL_COMMUNITY): Payer: Medicare Other

## 2018-09-26 ENCOUNTER — Encounter (HOSPITAL_COMMUNITY): Payer: Self-pay

## 2018-09-26 ENCOUNTER — Inpatient Hospital Stay (HOSPITAL_COMMUNITY): Payer: Medicare Other | Admitting: Physical Therapy

## 2018-09-26 DIAGNOSIS — I509 Heart failure, unspecified: Secondary | ICD-10-CM

## 2018-09-26 NOTE — Progress Notes (Addendum)
Jeremiah [name] is a 73 y.o. male Aug 23, 1945 [id number]  Subjective: The patient tells me he had an episode of anxiety last night. No new problems otherwise. Slept well. Feeling OK.  Objective: Vital signs in last 24 hours: Temp:  [98 F (36.7 C)-98.7 F (37.1 C)] 98.7 F (37.1 C) (06/13 0550) Pulse Rate:  [82-86] 82 (06/13 0550) Resp:  [16-20] 19 (06/13 0550) BP: (106-125)/(75-90) 106/75 (06/13 0550) SpO2:  [96 %-98 %] 97 % (06/13 0832) Weight:  [72.6 kg] 72.6 kg (06/13 0548) Weight change: -0.907 kg Last BM Date: 09/25/18  Intake/Output from previous day: 06/12 0701 - 06/13 0700 In: 73 [P.O.:460] Out: -  Last cbgs: CBG (last 3)  No results for input(s): GLUCAP in the last 72 hours.   Physical Exam General: No apparent distress   HEENT: not dry Lungs: Normal effort. Lungs clear to auscultation, no crackles or wheezes. Cardiovascular: Regular rate and rhythm, no edema Abdomen: S/NT/ND; BS(+) Musculoskeletal:  unchanged Neurological: No new neurological deficits Wounds: N/A    Skin: clear  Aging changes Mental state: Alert, ooperative In a wheelchair   Lab Results: BMET    Component Value Date/Time   NA 138 09/22/2018 0511   K 3.4 (L) 09/22/2018 0511   CL 104 09/22/2018 0511   CO2 25 09/22/2018 0511   GLUCOSE 96 09/22/2018 0511   BUN 11 09/22/2018 0511   CREATININE 1.23 09/22/2018 0511   CALCIUM 9.1 09/22/2018 0511   GFRNONAA 59 (L) 09/22/2018 0511   GFRAA >60 09/22/2018 0511   CBC    Component Value Date/Time   WBC 8.5 09/22/2018 0511   RBC 3.88 (L) 09/22/2018 0511   HGB 11.7 (L) 09/22/2018 0511   HCT 35.4 (L) 09/22/2018 0511   PLT 349 09/22/2018 0511   MCV 91.2 09/22/2018 0511   MCH 30.2 09/22/2018 0511   MCHC 33.1 09/22/2018 0511   RDW 12.2 09/22/2018 0511   LYMPHSABS 2.1 09/22/2018 0511   MONOABS 1.1 (H) 09/22/2018 0511   EOSABS 0.6 (H) 09/22/2018 0511   BASOSABS 0.1 09/22/2018 0511    Studies/Results: No results found.  Medications: I  have reviewed the patient's current medications.  Assessment/Plan:   1.  Decreased functional mobility with cognitive deficits secondary to left frontal hemorrhage secondary to hypertensive crisis and multiple medical issues.  Continue CIR 2.  DVT prophylaxis with SCDs.  He is on aspirin 81 mg daily 3.  Pain management with Tylenol 4.  Emotional support.  He is on Seroquel 50 mg nightly 5.  The patient is not capable of making decisions on his own behalf 6.  Skin care 7.  History of COPD.  Continue nebulizers 8.  Hypertension.  Bidil 20-37.5 --- 3 times a day 9.  Acute diastolic congestive heart failure.  On Lasix.  On BiDil.  Daily weights 10.  Small left lower lobe pulmonary embolism identified on CT angiogram of the chest on 08/31/2018 - no plans for anticoagulation 11.  Anxiety -  may need to treat     Length of stay, days: [place] , MD 09/26/2018, 1:02 PM

## 2018-09-26 NOTE — Progress Notes (Signed)
Physical Therapy Session Note  Patient Details  Name: Jeremiah [name] MRN: [id number] Date of Birth: May 17, 1945  Today's Date: 09/26/2018 PT Individual Time: 1324-4010 PT Individual Time Calculation (min): 73 min   Short Term Goals: Week 1:  PT Short Term Goal 1 (Week 1): Pt will negotiate 2 steps without rails with LRAD with mod assist. PT Short Term Goal 2 (Week 1): Pt will ambulate 50 ft with LRAD & supervision.  Skilled Therapeutic Interventions/Progress Updates:  Pt was seen bedside in the pm. Pt transferred to edge of bed with S. Pt transferred sit to stand multiple times with S and performed multiple stand pivot transfers with S to c/g with verbal cues. Pt ambulated 125 feet x 6 with c/g and verbal cues. In gym treatment focused on NMR, utilizing cone taps, criss cross cone taps and alternating cone taps, 3 sets x 10 reps each. Pt returned to room following treatment. Pt transferred edge of bed to supine with S. Pt left sitting up in bed with bed alarm on and call bell within reach.  Therapy Documentation Precautions:  Precautions Precautions: Fall Precaution Comments: impulsive Restrictions Weight Bearing Restrictions: No General:   Pain: No c/o pain.    Therapy/Group: Individual Therapy  Dub Amis 09/26/2018, 3:44 PM

## 2018-09-26 NOTE — Progress Notes (Signed)
Speech Language Pathology Daily Session Note  Patient Details  Name: Jeremiah [name] MRN: [id number] Date of Birth: 12-Aug-1945  Today's Date: 09/26/2018 SLP Individual Time: 0900-1000 SLP Individual Time Calculation (min): 60 min  Short Term Goals: Week 1: SLP Short Term Goal 1 (Week 1): STG=LTG due to short ELOS  Skilled Therapeutic Interventions: Skilled ST services focused on cognitive skills. Pt was orientated to place mod I and time with mod A verbal cues for visual aid. At the end of the session pt was orientated to place and time with supervision A verbal cues for visual aid. Therapy did not record in memory notebook yesterday and pt required mod A verbal cues to recall notebook. SLP facilitated basic problem solving skills with basic money management task, pt required max A verbal cues to count change and demonstrate requested amount, however required min A verbal cues when SLp broke it down step by step. SLP facilitated basic problem solving and sustained attention skills,in familiar card sorting task by shape in a field of 6, pt required initial set up of cards into piles by shapes and then demonstrated mod I for sorting cards with sustained attention for 15 minutes. SLP also facilitated basic problem solving skills in novel card task played at simplest level, pt required max A verbal cues and mod A verbal cues to recall 3 rules with visual aid present. Pt was left in room with call bell within reach and bed alarm set. ST recommends to continue skilled ST services.      Pain Pain Assessment Pain Score: 0-No pain  Therapy/Group: Individual Therapy  [name]  [facility] 09/26/2018, 12:52 PM

## 2018-09-26 NOTE — Progress Notes (Signed)
Occupational Therapy Session Note  Patient Details  Name: Jeremiah [name] MRN: [id number] Date of Birth: 14-Sep-1945  Today's Date: 09/26/2018 OT Individual Time: 1035-1130 OT Individual Time Calculation (min): 55 min   Short Term Goals: Week 1:  OT Short Term Goal 1 (Week 1): STGs=LTGs secondary to short LOS  Skilled Therapeutic Interventions/Progress Updates:    Pt greeted semi-reclined in bed and agreeable to OT treatment session focused on self-care retraining. Pt came to sitting EOB with supervision. CGA to pivot to wc. Worked on standing balance and endurance within bathing/dressing task at the sink. Pt reported 7/10 level of fatigue after multiple sit<>stands. Pt tolerated standing with supervision for 2 minutes to brush hair. Pt brought to therapy gym and wrote in memory journal while standing, also reviewed memory journal from earlier today. Pt completed 10 sit<>stands for LB strength and endurance. Pt needed intermittent rest breaks and verbal cues for deep breathing strategies. Stand-pivot back to bed with CGA. Pt left semi-reclined in bed with needs met.   Therapy Documentation Precautions:  Precautions Precautions: Fall Precaution Comments: impulsive Restrictions Weight Bearing Restrictions: No Pain:  denies pain   Therapy/Group: Individual Therapy  Valma Cava 09/26/2018, 11:02 AM

## 2018-09-26 NOTE — Plan of Care (Signed)
Pt is calling out for assistance and will wait for staff to assist OOB, pt still will put his legs out of the bedrails but does not attempt to stand. Telesitter remains with pt at this time.

## 2018-09-27 DIAGNOSIS — F410 Panic disorder [episodic paroxysmal anxiety] without agoraphobia: Secondary | ICD-10-CM

## 2018-09-27 NOTE — Progress Notes (Signed)
Jeremiah [name] is a 73 y.o. male [birth year] [id number]  Subjective: No new complaints. No new problems. Slept well. Feeling OK.  He reports no anxiety attack last night  Objective: Vital signs in last 24 hours: Temp:  [98.1 F (36.7 C)-98.9 F (37.2 C)] 98.9 F (37.2 C) (06/14 0558) Pulse Rate:  [83-92] 89 (06/14 0835) Resp:  [18-19] 18 (06/14 0835) BP: (111-132)/(68-92) 111/68 (06/14 0558) SpO2:  [96 %-100 %] 96 % (06/14 0838) Weight:  [72.1 kg] 72.1 kg (06/14 0558) Weight change: -0.454 kg Last BM Date: 09/27/18  Intake/Output from previous day: 06/13 0701 - 06/14 0700 In: 86 [P.O.:462] Out: -  Last cbgs: CBG (last 3)  No results for input(s): GLUCAP in the last 72 hours.   Physical Exam General: No apparent distress.  Very pleasant HEENT: not dry Lungs: Normal effort. Lungs clear to auscultation, no crackles or wheezes. Cardiovascular: Regular rate and rhythm, no edema Abdomen: S/NT/ND; BS(+) Musculoskeletal:  unchanged Neurological: No new neurological deficits Wounds: N/A    Skin: clear  Aging changes Mental state: Alert,  cooperative    Lab Results: BMET    Component Value Date/Time   NA 138 09/22/2018 0511   K 3.4 (L) 09/22/2018 0511   CL 104 09/22/2018 0511   CO2 25 09/22/2018 0511   GLUCOSE 96 09/22/2018 0511   BUN 11 09/22/2018 0511   CREATININE 1.23 09/22/2018 0511   CALCIUM 9.1 09/22/2018 0511   GFRNONAA 59 (L) 09/22/2018 0511   GFRAA >60 09/22/2018 0511   CBC    Component Value Date/Time   WBC 8.5 09/22/2018 0511   RBC 3.88 (L) 09/22/2018 0511   HGB 11.7 (L) 09/22/2018 0511   HCT 35.4 (L) 09/22/2018 0511   PLT 349 09/22/2018 0511   MCV 91.2 09/22/2018 0511   MCH 30.2 09/22/2018 0511   MCHC 33.1 09/22/2018 0511   RDW 12.2 09/22/2018 0511   LYMPHSABS 2.1 09/22/2018 0511   MONOABS 1.1 (H) 09/22/2018 0511   EOSABS 0.6 (H) 09/22/2018 0511   BASOSABS 0.1 09/22/2018 0511    Studies/Results: No results found.  Medications: I have  reviewed the patient's current medications.  Assessment/Plan:   1.  Decreased functional mobility with cognitive deficits secondary to left frontal hemorrhage secondary to hypertensive crisis and multiple medical issues.  Continue CIR 2.  DVT prophylaxis with SCDs.  He is on aspirin 81 mg daily 3.  Pain management with Tylenol 4.  Emotional support.  He is on Seroquel 50 mg at night 5.  The patient is not capable of making decisions on his own behalf 6.  Skin care 7.  History of COPD.  Continue nebulizer treatments 8.  Hypertension.  He is on BiDil 3 times a day 9.  Acute diastolic congestive heart failure.  On Lasix and BiDil.  Daily weights 10.  Small left lower lobe pulmonary embolism identified on CT angiogram on 08/31/2018.  No plans for anticoagulation 11.  Anxiety attacks.  No relapse.  Consider benzodiazepines as needed.     Length of stay, days: 6  Walker Kehr , MD 09/27/2018, 1:29 PM

## 2018-09-27 NOTE — Progress Notes (Signed)
Patient's wife requested chaplain visit. Before entering room, Chaplain spoke with RN and then called wife for context.  Per wife, being hospitalized is "messing with his mind" and patient is struggling with occasions of anxiety. He recently expressed jealousy of wife, which is very atypical of him.  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    Jeremiah [name] was a very delightful gentleman, who seemed to relish in spiritual and theological conversation, especially around scripture.  He acknowledged feeling bouts of anxiety.  Chaplain offered Bible readings, prayer and anointing with oil, which patient welcomed.  Faith is a deep well of strength for this patient.  Chaplain discussed Centering Prayer, silence, and prayer without words because pt noted that anxiety triggers rumination and negative thinking. Discussed relevant scripture (Romans 8:25-28).  Pt did not have a Bible and welcomed the offer of one. Chaplain will bring that along with printed scripture.  Will recommend ongoing spiritual care by the floor chaplain.  Please call as needed or requested before then.  [name], [place]       09/27/18 1400  Clinical Encounter Type  Visited With Patient;Family  Visit Type Initial;Spiritual support  Referral From Family  Consult/Referral To Chaplain  Spiritual Encounters  Spiritual Needs Prayer;Ritual;Sacred text  Stress Factors  Patient Stress Factors Loss of control  Family Stress Factors Family relationships

## 2018-09-28 ENCOUNTER — Inpatient Hospital Stay (HOSPITAL_COMMUNITY): Payer: Medicare Other | Admitting: Occupational Therapy

## 2018-09-28 ENCOUNTER — Inpatient Hospital Stay (HOSPITAL_COMMUNITY): Payer: Medicare Other

## 2018-09-28 ENCOUNTER — Inpatient Hospital Stay (HOSPITAL_COMMUNITY): Payer: Medicare Other | Admitting: Physical Therapy

## 2018-09-28 NOTE — Progress Notes (Signed)
Occupational Therapy Session Note  Patient Details  Name: Jeremiah [name] MRN: [id number] Date of Birth: [birth year]  Today's Date: 09/28/2018 OT Individual Time: 5701-7793 OT Individual Time Calculation (min): 59 min   Short Term Goals: Week 1:  OT Short Term Goal 1 (Week 1): STGs=LTGs secondary to short LOS  Skilled Therapeutic Interventions/Progress Updates:    Pt greeted in bed with no c/o pain. Agreeable to shower. He ambulated into bathroom without AD and close supervision. Close supervision while doffing LB clothing sit<stand from TTB. Pt covered himself with a towel in shower due to modesty. He bathed sit<stand with supervision. The same assist required during dressing tasks sit<stand from toilet. Encouraged more sitting vs standing to meet demands of tasks. He was able to stoop to floor to retrieve a few items without LOB. Pt then ambulated back to bed and applied lotion to dry skin. Discussed his d/c date on room calendar (Wednesday), as he though it was tomorrow. Lastly, guided pt through gentle cervical stretches due to report of neck soreness. He was grateful of exercise education. At end of session pt remained in bed with all needs within reach, 4 bedrails up, and bed alarm set. Tx focus placed on ADL retraining, functional ambulation, activity tolerance, and dynamic balance.    Therapy Documentation Precautions:  Precautions Precautions: Fall Precaution Comments: impulsive Restrictions Weight Bearing Restrictions: No Vital Signs: Therapy Vitals Temp: 98.3 F (36.8 C) Temp Source: Oral Pulse Rate: 88 Resp: 16 BP: 134/80 Patient Position (if appropriate): Sitting Oxygen Therapy SpO2: 99 % O2 Device: Room Air Pain: Pain Assessment Pain Score: 0-No pain ADL:       Therapy/Group: Individual Therapy  Jeremiah [name] 09/28/2018, 4:28 PM

## 2018-09-28 NOTE — Discharge Summary (Signed)
Physician Discharge Summary  Patient ID: Jeremiah [name] MRN: [id number] DOB/AGE: 1946-02-06 73 y.o.  Admit date: 09/21/2018 Discharge date: 09/30/2018  Discharge Diagnoses:  Active Problems:   ICH (intracerebral hemorrhage) (HCC) Pain management History of COPD Hypertension Acute diastolic congestive heart failure Small left lower lobe pulmonary embolism  Discharged Condition: Stable  Significant Diagnostic Studies: Dg Chest 1 View  Result Date: 09/14/2018 CLINICAL DATA:  73 year old male with recent respiratory failure, pneumonia. EXAM: CHEST  1 VIEW COMPARISON:  09/07/2018 and earlier. FINDINGS: Portable AP semi upright view at 0420 hours. Extubated. Enteric feeding tube remains in place. There is a new right upper extremity approach PICC line with tip at the lower SVC level. Mediastinal contours are stable and within normal limits. Lung volumes are stable. Allowing for portable technique the lungs are clear. Incidental nipple shadows. Chronic left rib fractures again noted. IMPRESSION: 1. Extubated. Enteric feeding tube remains in place. Right upper extremity approach PICC line tip at the lower SVC level. 2. Stable lung volumes.  No acute cardiopulmonary abnormality. Electronically Signed   By: Genevie Ann M.D.   On: 09/14/2018 05:21   Dg Abd 1 View  Result Date: 09/02/2018 CLINICAL DATA:  Enteric tube placement EXAM: ABDOMEN - 1 VIEW COMPARISON:  None. FINDINGS: An NJ tube was placed using fluoroscopic guidance. The total fluoroscopy time was 3 minutes and 30 seconds. 20 mL of Omnipaque 300 was utilized for this examination. A 10 French NJ tube was placed. Injection of contrast demonstrates opacification of the jejunum. A second enteric tube projects over the gastric body. IMPRESSION: NJ tube placement as above.  The tip terminates in the jejunum. Electronically Signed   By: Constance Holster M.D.   On: 09/02/2018 13:44   Ct Head Wo Contrast  Result Date: 09/04/2018 CLINICAL DATA:   Intracranial hemorrhage follow up EXAM: CT HEAD WITHOUT CONTRAST TECHNIQUE: Contiguous axial images were obtained from the base of the skull through the vertex without intravenous contrast. COMPARISON:  Head CT 09/02/2018 FINDINGS: Brain: Unchanged small focus of parenchymal hemorrhage within the subcortical left frontal lobe, measuring 6 mm. No associated mass effect. No new site of hemorrhage. Confluent hypoattenuation of the periventricular white matter consistent with chronic small vessel ischemia. Vascular: No abnormal hyperdensity of the major intracranial arteries or dural venous sinuses. No intracranial atherosclerosis. Skull: The visualized skull base, calvarium and extracranial soft tissues are normal. Sinuses/Orbits: No fluid levels or advanced mucosal thickening of the visualized paranasal sinuses. No mastoid or middle ear effusion. The orbits are normal. IMPRESSION: 1. Unchanged small focus of subcortical hemorrhage within the left frontal lobe. No midline shift or mass effect. 2. Severe chronic ischemic changes of the white matter. Electronically Signed   By: Ulyses Jarred M.D.   On: 09/04/2018 14:44   Ct Head Wo Contrast  Result Date: 09/02/2018 CLINICAL DATA:  Follow-up cerebral hemorrhage. EXAM: CT HEAD WITHOUT CONTRAST TECHNIQUE: Contiguous axial images were obtained from the base of the skull through the vertex without intravenous contrast. COMPARISON:  Head CT 08/31/2018 and MRI 09/02/2018 FINDINGS: Brain: A 5 mm focus of subcortical hemorrhage in the posterior left frontal lobe is unchanged in size from the prior CT and is without associated edema. Focal linear hyperattenuation in a left parietal sulcus is unchanged from the prior CT and may represent a vessel versus trace subarachnoid hemorrhage. There is no new intracranial hemorrhage elsewhere. Punctate acute infarcts in the left frontal and left temporal lobes on today's MRI are not well demonstrated by CT. Confluent  cerebral white  matter hypodensities are unchanged and compatible with severe chronic small vessel ischemic disease. Mild cerebral atrophy is noted. There is no midline shift or extra-axial fluid collection. Vascular: Calcified atherosclerosis at the skull base. Skull: No fracture or focal osseous lesion. Sinuses/Orbits: Minimal scattered mucosal thickening in the paranasal sinuses. Clear mastoid air cells. Unremarkable orbits. Other: None. IMPRESSION: 1. Unchanged small hemorrhage in the posterior left frontal lobe. 2. Unchanged focal density in a left parietal sulcus which may be vascular or indicative of trace subarachnoid hemorrhage. 3. No new intracranial hemorrhage. 4. Severe chronic small vessel ischemic disease. Electronically Signed   By: Logan Bores M.D.   On: 09/02/2018 12:40   Ct Head Wo Contrast  Result Date: 08/31/2018 CLINICAL DATA:  Altered level of consciousness, unexplained EXAM: CT HEAD WITHOUT CONTRAST TECHNIQUE: Contiguous axial images were obtained from the base of the skull through the vertex without intravenous contrast. COMPARISON:  None similar FINDINGS: Brain: 5 mm ovoid high-density focus in the subcortical high left frontal region. Background of confluent low-density in the bilateral cerebral white matter. Linear high-density within a left parietal sulcus which has the appearance of a vessel on sagittal reformats. No evidence of acute gray matter infarct. No masslike finding or hydrocephalus. Vascular: No hyperdense vessel noted. Skull: Negative Sinuses/Orbits: The sinuses are essentially clear.  Negative orbits. Critical Value/emergent results were called by telephone at the time of interpretation on 08/31/2018 at 7:06 am to Dr. Ezequiel Essex , who verbally acknowledged these results. IMPRESSION: 1. 5 mm acute subcortical hemorrhage in the left frontal lobe. Extensive chronic white matter disease, question amyloid angiopathy given this location. 2. Attention to a minimally high-dense left  parietal sulcus on follow-up. Electronically Signed   By: Monte Fantasia M.D.   On: 08/31/2018 07:06   Ct Angio Chest Pe W And/or Wo Contrast  Result Date: 08/31/2018 CLINICAL DATA:  Altered level of consciousness.  Elevated D-dimer EXAM: CT ANGIOGRAPHY CHEST WITH CONTRAST TECHNIQUE: Multidetector CT imaging of the chest was performed using the standard protocol during bolus administration of intravenous contrast. Multiplanar CT image reconstructions and MIPs were obtained to evaluate the vascular anatomy. CONTRAST:  12mL OMNIPAQUE IOHEXOL 350 MG/ML SOLN COMPARISON:  02/20/2016 FINDINGS: Cardiovascular: Normal heart size. No pericardial effusion. Coronary calcification is present. There is no opacification of the aorta due to contrast timing. A single subsegmental pulmonary embolism is seen in the left lower lobe. Proximally the filling defect is eccentric, but there does appear to be branching into the downstream vessel and this is presumably acute in this setting. No large or second pulmonary embolism is seen. There is motion artifact at the bases. Pulmonary arteries are not enlarged and there is no right ventricular dilatation in this patient with elevated troponin Mediastinum/Nodes: Negative for adenopathy. Lungs/Pleura: Centrilobular emphysema. Patchy airway debris without collapse. There is no edema, consolidation, effusion, or pneumothorax. Endotracheal tube in good position. Upper Abdomen: Orogastric tube in good position. Musculoskeletal: No acute or aggressive finding Critical Value/emergent results were called by telephone at the time of interpretation on 08/31/2018 at 7:04 am to Dr. Ezequiel Essex , who verbally acknowledged these results. Review of the MIP images confirms the above findings. IMPRESSION: 1. Single subsegmental pulmonary embolism to the left lower lobe. 2. Coronary atherosclerosis. 3. Emphysema. Electronically Signed   By: Monte Fantasia M.D.   On: 08/31/2018 07:11   Mr Virgel Paling  PV Contrast  Result Date: 09/02/2018 CLINICAL DATA:  73 y/o  M; left frontal lobe cerebral  hemorrhage. EXAM: MRI HEAD WITHOUT CONTRAST MRA HEAD WITHOUT CONTRAST TECHNIQUE: Multiplanar, multiecho pulse sequences of the brain and surrounding structures were obtained without intravenous contrast. Angiographic images of the head were obtained using MRA technique without contrast. COMPARISON:  08/31/2018 CT of the head. FINDINGS: MRI HEAD FINDINGS Brain: 5 mm focus of acute hemorrhage within the left frontal lobe is associated with a small linear focus of subcortical reduced diffusion compatible with acute/early subacute infarction (series 5, image 90). Additional punctate foci of reduced diffusion are present in the left anterior frontal lobe and the left temporal lobe without associated hemorrhage. Large confluent nonspecific T2 FLAIR hyperintensities in subcortical and periventricular white matter are compatible with advanced chronic microvascular ischemic changes. Mild-to-moderate volume loss of the brain. Small chronic infarct is present within the left cerebellar hemisphere. There are numerous small foci of susceptibility hypointensity throughout the brain compatible with hemosiderin deposition of microhemorrhage. Foci of hemorrhage are in a predominantly peripheral distribution. Additionally, there are areas of superficial siderosis over the left-greater-than-right cerebral convexities from prior subarachnoid hemorrhage. Vascular: As below. Skull and upper cervical spine: Normal marrow signal. Sinuses/Orbits: Negative. Other: None. MRA HEAD FINDINGS Anterior circulation: No large vessel occlusion, aneurysm, or significant stenosis is identified. Posterior circulation: No large vessel occlusion, aneurysm, or significant stenosis is identified. Anatomic variation: Large left A1, small right A1, large anterior communicating artery, normal variant. Fetal right PCA. IMPRESSION: MRI head: 1. 5 mm focus of acute  hemorrhage in the left frontal lobe is associated with a small subcortical acute/early subacute infarction. Additional punctate foci of acute/early subacute infarction are present in the left anterior frontal lobe and the left temporal lobe. 2. Numerous foci of chronic hemorrhage throughout the brain in a predominant peripheral distribution as well as superficial siderosis over the left-greater-than-right cerebral convexities. The peripheral distribution favors amyloid angiopathy over chronic sequelae of hypertension. 3. Severe chronic microvascular ischemic changes and mild to moderate volume loss of the brain. MRA head: Patent anterior and posterior intracranial circulation. No large vessel occlusion, aneurysm, or significant stenosis is identified. Electronically Signed   By: Kristine Garbe M.D.   On: 09/02/2018 02:28   Mr Brain Wo Contrast  Result Date: 09/02/2018 CLINICAL DATA:  73 y/o  M; left frontal lobe cerebral hemorrhage. EXAM: MRI HEAD WITHOUT CONTRAST MRA HEAD WITHOUT CONTRAST TECHNIQUE: Multiplanar, multiecho pulse sequences of the brain and surrounding structures were obtained without intravenous contrast. Angiographic images of the head were obtained using MRA technique without contrast. COMPARISON:  08/31/2018 CT of the head. FINDINGS: MRI HEAD FINDINGS Brain: 5 mm focus of acute hemorrhage within the left frontal lobe is associated with a small linear focus of subcortical reduced diffusion compatible with acute/early subacute infarction (series 5, image 90). Additional punctate foci of reduced diffusion are present in the left anterior frontal lobe and the left temporal lobe without associated hemorrhage. Large confluent nonspecific T2 FLAIR hyperintensities in subcortical and periventricular white matter are compatible with advanced chronic microvascular ischemic changes. Mild-to-moderate volume loss of the brain. Small chronic infarct is present within the left cerebellar  hemisphere. There are numerous small foci of susceptibility hypointensity throughout the brain compatible with hemosiderin deposition of microhemorrhage. Foci of hemorrhage are in a predominantly peripheral distribution. Additionally, there are areas of superficial siderosis over the left-greater-than-right cerebral convexities from prior subarachnoid hemorrhage. Vascular: As below. Skull and upper cervical spine: Normal marrow signal. Sinuses/Orbits: Negative. Other: None. MRA HEAD FINDINGS Anterior circulation: No large vessel occlusion, aneurysm, or significant stenosis is identified.  Posterior circulation: No large vessel occlusion, aneurysm, or significant stenosis is identified. Anatomic variation: Large left A1, small right A1, large anterior communicating artery, normal variant. Fetal right PCA. IMPRESSION: MRI head: 1. 5 mm focus of acute hemorrhage in the left frontal lobe is associated with a small subcortical acute/early subacute infarction. Additional punctate foci of acute/early subacute infarction are present in the left anterior frontal lobe and the left temporal lobe. 2. Numerous foci of chronic hemorrhage throughout the brain in a predominant peripheral distribution as well as superficial siderosis over the left-greater-than-right cerebral convexities. The peripheral distribution favors amyloid angiopathy over chronic sequelae of hypertension. 3. Severe chronic microvascular ischemic changes and mild to moderate volume loss of the brain. MRA head: Patent anterior and posterior intracranial circulation. No large vessel occlusion, aneurysm, or significant stenosis is identified. Electronically Signed   By: Kristine Garbe M.D.   On: 09/02/2018 02:28   Dg Chest Port 1 View  Result Date: 09/07/2018 CLINICAL DATA:  73 year old male with history of intubation. EXAM: PORTABLE CHEST 1 VIEW COMPARISON:  Chest x-ray 09/06/2018. FINDINGS: An endotracheal tube is in place with tip 5.4 cm above the  carina. A feeding tube is seen extending into the abdomen, however, the tip of the feeding tube extends below the lower margin of the image. Lung volumes are normal. No consolidative airspace disease. No pleural effusions. No pneumothorax. No pulmonary nodule or mass noted. Pulmonary vasculature and the cardiomediastinal silhouette are within normal limits. IMPRESSION: 1. Support apparatus, as above. 2. No radiographic evidence of acute cardiopulmonary disease. Electronically Signed   By: Vinnie Langton M.D.   On: 09/07/2018 04:42   Dg Chest Port 1 View  Result Date: 09/06/2018 CLINICAL DATA:  73 year old male with history of intubation. EXAM: PORTABLE CHEST 1 VIEW COMPARISON:  Chest x-ray 09/05/2018. FINDINGS: An endotracheal tube is in place with tip 5.1 cm above the carina. A feeding tube is seen extending into the abdomen, however, the tip of the feeding tube extends below the lower margin of the image. Linear scarring or subsegmental atelectasis in the right lung base. No consolidative airspace disease. No pleural effusions. No evidence of pulmonary edema. No pneumothorax. Heart size is normal. Upper mediastinal contours are within normal limits. IMPRESSION: 1. Support apparatus, as above. 2. Minimal subsegmental atelectasis in the right lung base. Otherwise, unremarkable radiographic appearance the chest, as above. Electronically Signed   By: Vinnie Langton M.D.   On: 09/06/2018 07:16   Dg Chest Port 1 View  Result Date: 09/05/2018 CLINICAL DATA:  73 year old male with history of respiratory failure. History of COPD. EXAM: PORTABLE CHEST 1 VIEW COMPARISON:  Chest x-ray 09/01/2018. FINDINGS: An endotracheal tube is in place with tip 4.8 cm above the carina. A feeding tube is seen extending into the abdomen, however, the tip of the feeding tube extends below the lower margin of the image. Lung volumes are slightly low. No consolidative airspace disease. No pleural effusions. No evidence of pulmonary  edema. Heart size is upper limits of normal. The patient is rotated to the right on today's exam, resulting in distortion of the mediastinal contours and reduced diagnostic sensitivity and specificity for mediastinal pathology. Atherosclerosis in the thoracic aorta. IMPRESSION: 1. Support apparatus, as above. 2. No radiographic evidence of acute cardiopulmonary disease. 3. Aortic atherosclerosis. Electronically Signed   By: Vinnie Langton M.D.   On: 09/05/2018 05:21   Dg Chest Port 1 View  Result Date: 09/01/2018 CLINICAL DATA:  73 year old male intubated. Respiratory failure. Negative  for COVID-19 yesterday. EXAM: PORTABLE CHEST 1 VIEW COMPARISON:  0518 hours today and earlier. FINDINGS: Portable AP semi upright views at 2030 hours. ETT tip in good position between the level the clavicles and carina. Enteric tube tip is at the level of the gastric cardia, the side hole is at the distal esophagus level on image 2. Stable lung volumes and mediastinal contours. Allowing for portable technique the lungs are clear. Negative visible bowel gas pattern. No acute osseous abnormality identified. IMPRESSION: 1. ETT in good position. Enteric tube side hole in the distal esophagus, advance 6 cm to ensure side hole placement within the stomach. 2. No acute cardiopulmonary abnormality. Electronically Signed   By: Genevie Ann M.D.   On: 09/01/2018 20:47   Dg Chest Port 1 View  Result Date: 09/01/2018 CLINICAL DATA:  Respiratory failure EXAM: PORTABLE CHEST 1 VIEW COMPARISON:  08/31/2018 FINDINGS: Endotracheal tube and gastric catheter are noted in satisfactory position. The lungs are hyperinflated consistent with COPD. No focal infiltrate or sizable effusion is noted. No bony abnormality is seen. IMPRESSION: Tubes and lines as described. Emphysematous change without acute abnormality. Electronically Signed   By: Inez Catalina M.D.   On: 09/01/2018 08:13   Dg Abd Portable 1v  Result Date: 09/16/2018 CLINICAL DATA:   Encounter for nasogastric tube placement. EXAM: PORTABLE ABDOMEN - 1 VIEW COMPARISON:  None. FINDINGS: The bowel gas pattern is normal. Distal tip of nasogastric tube is seen in expected position of distal stomach. No radio-opaque calculi or other significant radiographic abnormality are seen. IMPRESSION: Distal tip of nasogastric tube seen in expected position of distal stomach. Electronically Signed   By: Marijo Conception M.D.   On: 09/16/2018 15:02   Dg Addison Bailey G Tube Plc W/fl-no Rad  Result Date: 09/25/2018 There is no Radiologist interpretation  for this exam.  Vas [place] Lower Extremity Venous (dvt)  Result Date: 08/31/2018  Lower Venous Study Indications: Pulmonary embolism.  Performing Technologist: Abram Sander RVS  Examination Guidelines: A complete evaluation includes B-mode imaging, spectral Doppler, color Doppler, and power Doppler as needed of all accessible portions of each vessel. Bilateral testing is considered an integral part of a complete examination. Limited examinations for reoccurring indications may be performed as noted.  +---------+---------------+---------+-----------+----------+-------+ RIGHT    CompressibilityPhasicitySpontaneityPropertiesSummary +---------+---------------+---------+-----------+----------+-------+ CFV      Full           Yes      Yes                          +---------+---------------+---------+-----------+----------+-------+ SFJ      Full                                                 +---------+---------------+---------+-----------+----------+-------+ FV Prox  Full                                                 +---------+---------------+---------+-----------+----------+-------+ FV Mid   Full                                                 +---------+---------------+---------+-----------+----------+-------+  FV DistalFull                                                  +---------+---------------+---------+-----------+----------+-------+ PFV      Full                                                 +---------+---------------+---------+-----------+----------+-------+ POP      Full           Yes      Yes                          +---------+---------------+---------+-----------+----------+-------+ PTV      Full                                                 +---------+---------------+---------+-----------+----------+-------+ PERO     Full                                                 +---------+---------------+---------+-----------+----------+-------+   +---------+---------------+---------+-----------+----------+-------+ LEFT     CompressibilityPhasicitySpontaneityPropertiesSummary +---------+---------------+---------+-----------+----------+-------+ CFV      Full           Yes      Yes                          +---------+---------------+---------+-----------+----------+-------+ SFJ      Full                                                 +---------+---------------+---------+-----------+----------+-------+ FV Prox  Full                                                 +---------+---------------+---------+-----------+----------+-------+ FV Mid   Full                                                 +---------+---------------+---------+-----------+----------+-------+ FV DistalFull                                                 +---------+---------------+---------+-----------+----------+-------+ PFV      Full                                                 +---------+---------------+---------+-----------+----------+-------+ POP  Full           Yes      Yes                          +---------+---------------+---------+-----------+----------+-------+ PTV      Full                                                 +---------+---------------+---------+-----------+----------+-------+ PERO     Full                                                  +---------+---------------+---------+-----------+----------+-------+     Summary: Right: There is no evidence of deep vein thrombosis in the lower extremity. No cystic structure found in the popliteal fossa. Left: There is no evidence of deep vein thrombosis in the lower extremity. No cystic structure found in the popliteal fossa.  *See table(s) above for measurements and observations. Electronically signed by Monica Martinez MD on 08/31/2018 at 6:10:55 PM.    Final    [place] Ekg Site Rite  Result Date: 09/05/2018 If Site Rite image not attached, placement could not be confirmed due to current cardiac rhythm.   Labs:  Basic Metabolic Panel: No results for input(s): NA, K, CL, CO2, GLUCOSE, BUN, CREATININE, CALCIUM, MG, PHOS in the last 168 hours.  CBC: No results for input(s): WBC, NEUTROABS, HGB, HCT, MCV, PLT in the last 168 hours.  CBG: No results for input(s): GLUCAP in the last 168 hours.  Family history.  Maternal grandmother with asthma Father with heart disease mother with dementia.  Denies any colon cancer Brief HPI:   Jeremiah [name] is a 73 year old right-handed male with history of COPD followed by Dr. Chase Caller, sleep apnea, hypertension, PTSD.  Per chart review lives with spouse reportedly independent prior to admission.  Presented 08/30/2020 [facility] with increasing shortness of breath.  Placed on noninvasive motion ventilation and required intubation.  In the ED patient had significant bronchospasm.  CTA of chest with incidental finding of small left lower lobe pulmonary emboli.  CT of the head showed a 5 mm acute subcortical hemorrhage in the left frontal lobe.  EKG showed ST elevation and PR depression.  Troponin 1.9 elevated from 0.7.  CT angiogram of the chest showed a single sub-segmental pulmonary emboli to the left lower lobe.  No obvious signs of hemodynamic instability no anticoagulation due to intracranial  hemorrhage.  Patient was transferred to [facility].  Intravenous heparin deferred secondary to subcortical hemorrhage.  Echocardiogram with ejection fraction 25% severely reduced systolic function findings consistent with Takatsubo cardiomyopathy.  Lower extremity Dopplers negative for DVT.  Follow-up MRI 09/02/2018 again showed 5 mm focus of acute hemorrhage left frontal lobe associated with small subcortical acute early subacute infarction.  Additional punctate foci of acute early subacute infarcts present in the left anterior frontal lobe and left temporal lobe.  MRA with no large vessel occlusion or aneurysm.  Patient was cleared for daily aspirin.  Intermittent bouts of agitation and restlessness initially required Precedex.  EEG was negative for seizure.  Required intubation through 09/09/2018 and extubated.  Hospital course AKI with creatinine 1.34-1.53.  Bouts of hypotension requiring pressors.  Acute systolic congestive heart failure with echocardiogram completed ejection fraction 66% normal systolic function and patient remained on low-dose Lasix.  His diet was slowly advanced.  Agitation delirium and restlessness did improve maintained on low-dose Seroquel.  Precedex had been discontinued.  Patient was admitted for a comprehensive rehab program.  Hospital Course: Jeremiah [name] was admitted to rehab 09/21/2018 for inpatient therapies to consist of PT, ST and OT at least three hours five days a week. Past admission physiatrist, therapy team and rehab RN have worked together to provide customized collaborative inpatient rehab.  In regards to patient's left frontal hemorrhage secondary to hypertensive crisis continue to participate with therapies.  Maintained on low-dose aspirin.  Noted incidental findings of small pulmonary emboli no treatment due to subcortical hemorrhage low-dose aspirin had been cleared.  Lower extremity Dopplers negative.  Mood stabilization with low dose of Seroquel with good  results.  Patient exhibited no signs of fluid overload he continued on low-dose Lasix.  Blood pressures controlled with Bidil 3 times daily.  Physical exam.  Blood pressure 115/65 pulse 90 temperature 98.1 respirations 18 oxygen saturations 98% room air. HEENT Head.  Normocephalic and atraumatic Eyes.  Pupils round and reactive to light without discharge EOMs normal Neck.  Supple nontender no tracheal deviation no thyromegaly without JVD Cardiovascular normal rate exhibits no friction rub no murmur heard Respiratory.  Effort normal no respiratory distress without wheeze GI.  Soft no distention nontender without rebound Neurological.  Patient sitting up in bed follows commands limited insight and awareness right upper extremity 4 out of 5 left upper extremity 5 out of 5 right lower extremity 3 out of 5 hip flexors knee extension 4 out of 5 ankle dorsi plantarflexion.  Left lower extremity 4 out of 5 hip flexors knee extension and 5 out of 5 ankle dorsi plantarflexion.  Rehab course: During patient's stay in rehab weekly team conferences were held to monitor patient's progress, set goals and discuss barriers to discharge. At admission, patient required minimal assist ambulate 60 feet rolling walker, moderate assist stand pivot transfers, minimal assist sit to supine, minimal assist supine to sit.  Max assist upper body bathing total assist lower body bathing max assist upper body dressing total assist lower body dressing moderate assist toilet transfers  He  has had improvement in activity tolerance, balance, postural control as well as ability to compensate for deficits. He has had improvement in functional use RUE/LUE  and RLE/LLE as well as improvement in awareness.  Patient transfers sit to stand with supervision ambulates around unit without assistive device and supervision for safety.  Patient with decreased foot clearance and heel strike right lower extremity during swing phase.  Patient with  improving memory as he is able to recall current date without assistance and improving awareness.  Engaged in trampoline ball toss while standing on foam with normal and narrow BOS.  Completes floor transfers x2 with supervision.  He ambulates to the bathroom without assistive device and close supervision.  Close supervision while doffing lower body clothing sit to stand from TTB.  He bathe sit to stand with supervision.  Patient demonstrated orientation with moderate assist verbal cues for visual aids to recall place and time.  Demonstrated improvement in short-term recall and carryover of strategies.  Full family teaching was completed plan discharge to home.       Disposition: Discharge disposition: 01-Home or Self Care     Discharge to home   Diet: Regular  Special Instructions: No smoking  driving or alcohol  Medications at discharge 1.  Tylenol as needed 2.  Aspirin 81 mg daily 3.  Pepcid 20 mg nightly 4.  Lasix 20 mg p.o. daily 5.Bidil 20-37.5 mg 3 times daily 6.  Multivitamin daily 7.  Protonix 40 mg p.o. daily 8.  Pravachol 10 mg p.o. daily 9.  Seroquel 50 mg p.o. nightly  Discharge Instructions    Ambulatory referral to Neurology   Complete by: As directed    An appointment is requested in approximately 4 to 6 weeks ICH related to hypertensive crisis   Ambulatory referral to Physical Medicine Rehab   Complete by: As directed    Moderate complexity follow-up 1 to 2 weeks ICH      Follow-up Information    Meredith Staggers, MD Follow up.   Specialty: Physical Medicine and Rehabilitation Why: Office to call for appointment Contact information: [address] [place] [address] [phone]        Sanda Klein, MD Follow up.   Specialty: Cardiology Why: Call for appointment Contact information: [address] [place] [address] [phone]        Selena Lesser, MD. Schedule an appointment as soon as possible for a  visit in 2 week(s).   Specialty: Family Medicine Why: for hospital follow up appt Contact information: [address] [place] [address] [phone]           Signed: Lavon Paganini [place] 09/30/2018, 6:19 AM

## 2018-09-28 NOTE — Progress Notes (Addendum)
Physical Therapy Discharge Summary  Patient Details  Name: Jeremiah [name] MRN: [id number] Date of Birth: January 16, 1946  Today's Date: 09/29/2018    Patient has met 10 of 10 long term goals due to improved activity tolerance, improved balance, improved postural control, increased strength, ability to compensate for deficits, functional use of  right upper extremity and right lower extremity, improved attention, improved awareness and improved coordination.  Patient to discharge at an ambulatory level supervision without AD, except min assist for stair negotiation without rails with HHA.   Patient's care partner is independent to provide the necessary physical and cognitive assistance at discharge.  Reasons goals not met: n/a  Recommendation:  Patient will benefit from ongoing skilled PT services in home health setting to continue to advance safe functional mobility, address ongoing impairments in balance, endurance, R NMR, transfers, gait, stair negotiation, cognition (memory, awareness), and minimize fall risk.  Equipment: transport w/c for community mobility  Reasons for discharge: treatment goals met and discharge from hospital  Patient/family agrees with progress made and goals achieved: Yes  PT Discharge Precautions/Restrictions Precautions Precautions: Fall Restrictions Weight Bearing Restrictions: No  Vision/Perception  Pt wears glasses at all times at baseline. No changes in baseline vision. Perception WNL.  Cognition Overall Cognitive Status: Impaired/Different from baseline Arousal/Alertness: Awake/alert Orientation Level: Oriented to person;Oriented to place;Oriented to time;Oriented to situation(uses memory aide to assist with recalling time) Memory: Impaired Memory Impairment: Decreased recall of new information;Storage deficit Awareness: Impaired Awareness Impairment: Emergent impairment;Intellectual impairment;Anticipatory impairment Problem Solving:  Impaired Safety/Judgment: Impaired   Sensation Sensation Light Touch: Not tested Proprioception: Impaired by gross assessment Coordination Gross Motor Movements are Fluid and Coordinated: No(limited by mild R hemiparesis) Fine Motor Movements are Fluid and Coordinated: Yes   Motor  Motor Motor: Abnormal postural alignment and control Motor - Discharge Observations: generalized deconditioning, mild R hemiparesis   Mobility Bed Mobility Bed Mobility: Rolling Left;Sit to Supine;Supine to Sit;Rolling Right Rolling Right: Supervision/verbal cueing Rolling Left: Supervision/Verbal cueing Supine to Sit: Supervision/Verbal cueing Sit to Supine: Supervision/Verbal cueing Transfers Transfers: Sit to Stand;Stand to Sit Sit to Stand: Supervision/Verbal cueing Stand to Sit: Supervision/Verbal cueing  Locomotion  Gait Ambulation: Yes Gait Assistance: Supervision/Verbal cueing Gait Distance (Feet): 150 Feet Assistive device: None Gait Assistance Details: Verbal cues for technique;Verbal cues for gait pattern Gait Gait: Yes Gait Pattern: Decreased stride length;Decreased hip/knee flexion - right;Decreased dorsiflexion - right(decreased foot clearance RLE) Gait velocity: decreased High Level Ambulation High Level Ambulation: Backwards walking Backwards Walking: CGA without AD Stairs / Additional Locomotion Stairs: Yes Stairs Assistance: Minimal Assistance - Patient > 75% Stair Management Technique: No rails;Other (comment)(HHA) Number of Stairs: 8 Height of Stairs: 6(inches) Wheelchair Mobility Wheelchair Mobility: No   Trunk/Postural Assessment  Cervical Assessment Cervical Assessment: Within Functional Limits Thoracic Assessment Thoracic Assessment: Within Functional Limits Lumbar Assessment Lumbar Assessment: Within Functional Limits Postural Control Righting Reactions: delayed Protective Responses: delayed   Balance Balance Balance Assessed: Yes Dynamic Standing  Balance Dynamic Standing - Balance Support: No upper extremity supported(gait without AD) Dynamic Standing - Level of Assistance: 5: Stand by assistance  Berg Balance Test = 35/56 on 09/29/2018  Extremity Assessment  Mild R hemiparesis but RUE/RLE WFL (RLE grossly 3+/5 as no buckling noted in weight bearing). LUE/LLE WNL.   Waunita Schooner 09/29/2018, 2:44 PM

## 2018-09-28 NOTE — Plan of Care (Signed)
  Problem: RH BLADDER ELIMINATION Goal: RH STG MANAGE BLADDER WITH ASSISTANCE Description: STG Manage Bladder With Assistance Outcome: Progressing   Problem: RH SKIN INTEGRITY Goal: RH STG SKIN FREE OF INFECTION/BREAKDOWN Outcome: Progressing   Problem: RH SAFETY Goal: RH STG ADHERE TO SAFETY PRECAUTIONS W/ASSISTANCE/DEVICE Description: STG Adhere to Safety Precautions With Assistance/Device. Outcome: Progressing   Problem: RH COGNITION-NURSING Goal: RH STG USES MEMORY AIDS/STRATEGIES W/ASSIST TO PROBLEM SOLVE Description: STG Uses Memory Aids/Strategies With Assistance to Problem Solve. Outcome: Progressing Goal: RH STG ANTICIPATES NEEDS/CALLS FOR ASSIST W/ASSIST/CUES Description: STG Anticipates Needs/Calls for Assist With Assistance/Cues. Outcome: Progressing   Problem: RH PAIN MANAGEMENT Goal: RH STG PAIN MANAGED AT OR BELOW PT'S PAIN GOAL Outcome: Progressing

## 2018-09-28 NOTE — Progress Notes (Signed)
Speech Language Pathology Daily Session Note  Patient Details  Name: Jeremiah [name] MRN: [id number] Date of Birth: 06-06-1945  Today's Date: 09/28/2018 SLP Individual Time: 0802-0900 SLP Individual Time Calculation (min): 58 min  Short Term Goals: Week 1: SLP Short Term Goal 1 (Week 1): STG=LTG due to short ELOS  Skilled Therapeutic Interventions:Skilled ST services focused on swallow and cognitive skills. Pt demonstrated orientation with mod A verbal cues for visual aids to recall place and time, however at the end of session demonstrated mod I. SLP facilitated PO consumption trial snack of regular texture and thin via straw, pt demonstrated appropriate oral clearance and no overt s/s aspiration. Pt supports primarily mechanical soft diet piror to admission, due to not wearing denture at home. SLP upgraded diet to regular textures, allowing straws (sensed trace aspiration on MBS) medication whole with thin and intermittent supervision for least restrictive diet. SLP provided education, given handout on mechanical soft textures verse regular, therefore pt is educated on which option he is choosing, all questions answered to satisfaction. Pt demonstrated improvement in short term recall and carryover of strategies, pt recalled weekend events with min A verbal cues for memory notebook and independently recorded events on Sunday when therapy was not present. SLP provided education on memory strategies, given handout. Pt was left in room with call bell within reach and chair alarm set. ST recommends to continue skilled ST services.      Pain Pain Assessment Pain Score: 0-No pain  Therapy/Group: Individual Therapy  Tanuj Mullens  [facility] 09/28/2018, 2:37 PM

## 2018-09-28 NOTE — Progress Notes (Signed)
Physical Therapy Session Note  Patient Details  Name: Jeremiah [name] MRN: [id number] Date of Birth: 04-03-1946  Today's Date: 09/28/2018 PT Individual Time: 0905-1015 PT Individual Time Calculation (min): 70 min   Short Term Goals: Week 1:  PT Short Term Goal 1 (Week 1): Pt will negotiate 2 steps without rails with LRAD with mod assist. PT Short Term Goal 2 (Week 1): Pt will ambulate 50 ft with LRAD & supervision.  Skilled Therapeutic Interventions/Progress Updates:  Pt received in recliner & agreeable to tx with RN present administering meds, pt denies c/o pain during session. Pt transfers sit<>stand with supervision and ambulates around unit without AD & supervision. Pt with decreased foot clearance & heel strike RLE during swing phase but improving weight shifting to L during LLE stance phase. Pt with improving memory as he is able to recall current date without assistance, and improving awareness as pt reports he doesn't want to get dependent upon crutch. In gym, pt negotiates 4 steps (6") x 2 trials without rails with min HHA and max instructional cuing for compensatory pattern to simulate pt's house with 2 steps to enter without rails. Pt engaged in trampoline ball toss while standing on foam with normal and narrow BOS with min assist for balance to focus on standing balance & endurance. Pt completes floor transfer x 2 times with supervision. On mat table, pt transitioned to quadruped & engaged in bird dog exercises with multimodal cuing for technique and task focusing on core strengthening & balance. Retrograde gait x 30 ft x 2 with CGA<>min assist with task focusing on weight shifting L<>R and dynamic balance. Pt utilized cybex kinetron in standing with BUE support and supervision with cuing for posture with task focusing on endurance training, BLE strengthening, R NMR, and weight shifting L<>R & dynamic balance. At end of session pt left sitting in recliner with chair alarm donned & all needs in  reach.  Pt requires seated rest breaks throughout session 2/2 fatigue/SOB, but SpO2 WNL.  Therapy Documentation Precautions:  Precautions Precautions: Fall Precaution Comments: impulsive Restrictions Weight Bearing Restrictions: No    Therapy/Group: Individual Therapy  Waunita Schooner 09/28/2018, 10:17 AM

## 2018-09-28 NOTE — Progress Notes (Addendum)
[facility] PHYSICAL MEDICINE & REHABILITATION PROGRESS NOTE   Subjective/Complaints: No new problems over night.   ROS: Patient denies fever, rash, sore throat, blurred vision, nausea, vomiting, diarrhea, cough, shortness of breath or chest pain, joint or back pain, headache, or mood change.    Objective:   No results found. No results for input(s): WBC, HGB, HCT, PLT in the last 72 hours. No results for input(s): NA, K, CL, CO2, GLUCOSE, BUN, CREATININE, CALCIUM in the last 72 hours.  Intake/Output Summary (Last 24 hours) at 09/28/2018 0953 Last data filed at 09/27/2018 1810 Gross per 24 hour  Intake 666 ml  Output -  Net 666 ml     Physical Exam: Vital Signs Blood pressure 115/80, pulse 85, temperature 99 F (37.2 C), temperature source Oral, resp. rate 18, height 6' 1.5" (1.867 m), weight 67.6 kg, SpO2 95 %. Constitutional: No distress . Vital signs reviewed. HEENT: EOMI, oral membranes moist Neck: supple Cardiovascular: RRR without murmur. No JVD    Respiratory: CTA Bilaterally without wheezes or rales. Normal effort    GI: BS +, non-tender, non-distended  Neurological: Alert, oriented to person, place. Follows commands.  RUE 4/5. LUE 5/5. RLE: 3/5 HF, KE and 4/5 ADF/PF. LLE: 4/5 HF, KE and 5/5 ADF/PF. No focal sensory deficits Skin: Skin iswarm.  Psychiatric: pleasant    Assessment/Plan: 1. Functional deficits secondary to left frontal hemorrhage which require 3+ hours per day of interdisciplinary therapy in a comprehensive inpatient rehab setting.  Physiatrist is providing close team supervision and 24 hour management of active medical problems listed below.  Physiatrist and rehab team continue to assess barriers to discharge/monitor patient progress toward functional and medical goals  Care Tool:  Bathing    Body parts bathed by patient: Right arm, Left upper leg, Right lower leg, Left arm, Chest, Abdomen, Left lower leg, Face, Front perineal area, Right  upper leg, Buttocks         Bathing assist Assist Level: Contact Guard/Touching assist     Upper Body Dressing/Undressing Upper body dressing   What is the patient wearing?: Pull over shirt    Upper body assist Assist Level: Set up assist    Lower Body Dressing/Undressing Lower body dressing      What is the patient wearing?: Underwear/pull up, Pants     Lower body assist Assist for lower body dressing: Supervision/Verbal cueing     Toileting Toileting    Toileting assist Assist for toileting: Contact Guard/Touching assist     Transfers Chair/bed transfer  Transfers assist     Chair/bed transfer assist level: Contact Guard/Touching assist     Locomotion Ambulation   Ambulation assist      Assist level: Contact Guard/Touching assist Assistive device: No Device Max distance: 125   Walk 10 feet activity   Assist     Assist level: Contact Guard/Touching assist Assistive device: No Device   Walk 50 feet activity   Assist Walk 50 feet with 2 turns activity did not occur: Safety/medical concerns  Assist level: Contact Guard/Touching assist Assistive device: No Device    Walk 150 feet activity   Assist Walk 150 feet activity did not occur: Safety/medical concerns         Walk 10 feet on uneven surface  activity   Assist     Assist level: Moderate Assistance - Patient - 50 - 74% Assistive device: (none)   Wheelchair     Assist Will patient use wheelchair at discharge?: (community; likely primarily ambulatory) Type  of Wheelchair: Manual    Wheelchair assist level: Supervision/Verbal cueing Max wheelchair distance: 56'    Wheelchair 50 feet with 2 turns activity    Assist        Assist Level: Supervision/Verbal cueing   Wheelchair 150 feet activity     Assist Wheelchair 150 feet activity did not occur: Safety/medical concerns(endurance)        Medical Problem List and Plan: 1.Decreased functional  mobility with cognitive deficitssecondary to left frontal hemorrhage secondary to hypertensive crisis and multiple medical issues --Continue CIR therapies including PT, OT, and SLP   -ELOS 6/17 2. Antithrombotics: -DVT/anticoagulation:no treatment of PE d/t ICH -SCDs. Venous Dopplers negative -antiplatelet therapy: aspirin 81 mg daily 3. Pain Management:Tylenol as needed 4. Mood:Provide emotional support -antipsychotic agents: continue seroquel 50mg  qhs --seems to be effective    5. Neuropsych: This patientis notcapable of making decisions on hisown behalf. 6. Skin/Wound Care:Routine skin checks  7. Fluids/Electrolytes/Nutrition:reasonable PO so far  -repleting K+   8.History of COPD. Continue nebulizers. Followed by Dr. Chase Caller 9.Hypertension.Bidil20-37.5 mg 3 times daily 10.Acute diastolic congestive heart failure. Lasix 20 mg daily. Monitor for any signs of fluid overload -daily weights stable at present   [facility] Weights   09/26/18 0548 09/27/18 0558 09/28/18 0521  Weight: 72.6 kg 72.1 kg 67.6 kg    11.Small left lower lobe pulmonary embolism identified on CT angiogram of the chest 08/31/2018. No obvious signs of hemodynamic instability. No plans for anticoagulation.    LOS: 7 days A FACE TO FACE EVALUATION WAS PERFORMED  Meredith Staggers 09/28/2018, 9:53 AM

## 2018-09-29 ENCOUNTER — Encounter (HOSPITAL_COMMUNITY): Payer: Medicare Other

## 2018-09-29 ENCOUNTER — Ambulatory Visit (HOSPITAL_COMMUNITY): Payer: Medicare Other | Admitting: Physical Therapy

## 2018-09-29 ENCOUNTER — Inpatient Hospital Stay (HOSPITAL_COMMUNITY): Payer: Medicare Other | Admitting: Physical Therapy

## 2018-09-29 MED ORDER — ISOSORB DINITRATE-HYDRALAZINE 20-37.5 MG PO TABS: 1.0000 | ORAL_TABLET | Freq: Three times a day (TID) | ORAL | 0 refills | Status: AC

## 2018-09-29 MED ORDER — FUROSEMIDE 20 MG PO TABS: 20.0000 mg | ORAL_TABLET | Freq: Every day | ORAL | 0 refills | Status: DC

## 2018-09-29 MED ORDER — QUETIAPINE FUMARATE 50 MG PO TABS: 50.0000 mg | ORAL_TABLET | Freq: Every day | ORAL | 0 refills | Status: AC

## 2018-09-29 MED ORDER — ACETAMINOPHEN 325 MG PO TABS: 650.0000 mg | ORAL_TABLET | Freq: Four times a day (QID) | ORAL | Status: AC | PRN

## 2018-09-29 MED ORDER — PRAVASTATIN SODIUM 10 MG PO TABS: 10.0000 mg | ORAL_TABLET | Freq: Every day | ORAL | 0 refills | Status: AC

## 2018-09-29 MED ORDER — TRELEGY ELLIPTA 100-62.5-25 MCG/INH IN AEPB: 1.0000 | INHALATION_SPRAY | Freq: Every day | RESPIRATORY_TRACT | 1 refills | Status: DC

## 2018-09-29 MED ORDER — ESOMEPRAZOLE MAGNESIUM 40 MG PO CPDR: 40.0000 mg | DELAYED_RELEASE_CAPSULE | Freq: Every day | ORAL | 2 refills | Status: AC

## 2018-09-29 MED ORDER — ALBUTEROL SULFATE HFA 108 (90 BASE) MCG/ACT IN AERS: 2.0000 | INHALATION_SPRAY | RESPIRATORY_TRACT | 5 refills | Status: DC | PRN

## 2018-09-29 NOTE — Progress Notes (Signed)
Physical Therapy Session Note  Patient Details  Name: Jeremiah [name] MRN: [id number] Date of Birth: 12/15/1945  Today's Date: 09/29/2018 PT Individual Time: 6468-0321 and 2248-2500 PT Individual Time Calculation (min): 86 min and 41 min  Short Term Goals: Week 1:  PT Short Term Goal 1 (Week 1): Pt will negotiate 2 steps without rails with LRAD with mod assist. PT Short Term Goal 2 (Week 1): Pt will ambulate 50 ft with LRAD & supervision.  Skilled Therapeutic Interventions/Progress Updates:  Treatment 1: Pt received in bed & agreeable to tx, denying c/o pain. Pt transferred to sitting EOB with supervision and sit<>stand throughout session with supervision. Pt donned paper scrub pants with supervision for standing balance and pt ambulates around unit without AD & supervision with decreased RLE hip/knee flexion and heel strike and decreased weight shifting L. Administered Berg Balance Test with pt scoring 35/56; educated pt on interpretation of score & current fall risk. Patient demonstrates increased fall risk as noted by score of 35/56 on Berg Balance Scale.  (<36= high risk for falls, close to 100%; 37-45 significant >80%; 46-51 moderate >50%; 52-55 lower >25%). Pt requires seated rest breaks throughout test 2/2 fatigue 2/2 poor endurance. Pt negotiates 4 steps x 2 trials without rails and HHA min assist with total assist to recall attempting task yesterday & compensatory technique. Pt required more frequent seated rest breaks during gait today 2/2 fatigue & SOB but SpO2 always >90% on room air. Reviewed pursed lip breathing with pt. Pt reports need to use restroom and returns to room, performing toilet transfer with supervision. Pt with continent void, performing clothing management without assistance, hand hygiene standing at sink with distant supervision. Completed memory notebook entry with pt requirng max assist to recall events from session. Assisted pt with calling wife. Pt left in recliner with  alarm set & all needs at hand, RN in room.   Treatment 2: Pt received in handoff from OT, with wife [name]) present for caregiver training. No c/o pain reported during session. Educated pt & [name] on pt's improved Berg Balance Score, but ongoing fall risk. Educated wife on need to ensure dog does not get under pt's foot and to remove/secure throw rugs in the house. Pt ambulates around unit with therapist instructing pt's wife to stand on R side of pt 2/2 pt's weakness & impaired balance. Demonstrated stair negotiation with HHA & min assist with therapist providing instructional cuing for compensatory technique with pt & wife requiring continued cuing for technique & safety but wife able to provide physical assistance. Pt completes floor transfer with supervision with therapist educating pt's wife on situations when to call EMS after a fall. Pt negotiates uneven surface with LUE support on rail and RUE HHA and completes car transfer at sedan simulated height with supervision. Educated pt & wife on pt's SOB & pursed lip breathing, but SpO2 WNL. At end of session pt & wife voice no concerns regarding d/c home. Pt left in recliner with chair alarm donned, call bell in reach & wife present in room.   Educated pt & wife that pt does not require an AD for ambulation at this time.   Therapy Documentation Precautions:  Precautions Precautions: Fall Precaution Comments: impulsive Restrictions Weight Bearing Restrictions: No   Balance: Balance Balance Assessed: Yes Standardized Balance Assessment Standardized Balance Assessment: Berg Balance Test Berg Balance Test Sit to Stand: Able to stand without using hands and stabilize independently Standing Unsupported: Able to stand safely 2 minutes Sitting with  Back Unsupported but Feet Supported on Floor or Stool: Able to sit safely and securely 2 minutes Stand to Sit: Sits safely with minimal use of hands Transfers: Able to transfer with verbal cueing and  /or supervision Standing Unsupported with Eyes Closed: Able to stand 10 seconds with supervision Standing Ubsupported with Feet Together: Needs help to attain position but able to stand for 30 seconds with feet together From Standing, Reach Forward with Outstretched Arm: Can reach forward >12 cm safely (5") From Standing Position, Pick up Object from Floor: Able to pick up shoe, needs supervision From Standing Position, Turn to Look Behind Over each Shoulder: Needs supervision when turning Turn 360 Degrees: Needs close supervision or verbal cueing Standing Unsupported, Alternately Place Feet on Step/Stool: Able to complete 4 steps without aid or supervision(completes 8 steps with close supervision) Standing Unsupported, One Foot in Front: Able to take small step independently and hold 30 seconds Standing on One Leg: Tries to lift leg/unable to hold 3 seconds but remains standing independently Total Score: 35     Therapy/Group: Individual Therapy  Waunita Schooner 09/29/2018, 2:39 PM

## 2018-09-29 NOTE — Progress Notes (Signed)
Occupational Therapy Discharge Summary  Patient Details  Name: Jeremiah [name] MRN: [id number] Date of Birth: 01-30-1946  Patient has met 82 of 73 long term goals due to improved activity tolerance, improved balance, postural control, ability to compensate for deficits, functional use of  RIGHT lower extremity, improved attention, improved awareness and improved coordination.  Patient to discharge at overall Supervision level.  Patient's care partner is independent to provide the necessary physical and cognitive assistance at discharge.  Pt's wife has attended family education session in person.   Reasons goals not met: All treatment goals met.   Recommendation:  Patient will benefit from ongoing skilled OT services in home health setting to continue to advance functional skills in the area of BADL and iADL.  Equipment: shower chair  Reasons for discharge: treatment goals met and discharge from hospital  Patient/family agrees with progress made and goals achieved: Yes  OT Discharge Precautions/Restrictions  Precautions Precautions: Fall Restrictions Weight Bearing Restrictions: No General   Vital Signs Therapy Vitals Temp: 98 F (36.7 C) Pulse Rate: 75 Resp: 19 BP: (!) 145/80 Patient Position (if appropriate): Lying Oxygen Therapy SpO2: 97 % O2 Device: Room Air Pain Pain Assessment Pain Score: 0-No pain ADL ADL Eating: Set up Where Assessed-Eating: Chair Grooming: Supervision/safety Where Assessed-Grooming: Standing at sink Upper Body Bathing: Supervision/safety Where Assessed-Upper Body Bathing: Shower Lower Body Bathing: Supervision/safety Where Assessed-Lower Body Bathing: Shower Upper Body Dressing: Modified independent (Device) Where Assessed-Upper Body Dressing: Edge of bed Lower Body Dressing: Supervision/safety Where Assessed-Lower Body Dressing: Edge of bed Toileting: Supervision/safety Where Assessed-Toileting: Glass blower/designer: Distant  supervision Armed forces technical officer Method: Counselling psychologist: Emergency planning/management officer Transfer: Distant supervision Social research officer, government Method: Heritage manager: Civil engineer, contracting with back Vision Baseline Vision/History: Wears glasses Wears Glasses: At all times Patient Visual Report: No change from baseline Vision Assessment?: No apparent visual deficits Perception  Perception: Within Functional Limits Praxis Praxis: Intact Cognition Overall Cognitive Status: History of cognitive impairments - at baseline Arousal/Alertness: Awake/alert Orientation Level: Oriented to person;Oriented to situation;Oriented to place;Oriented to time Attention: Sustained Sustained Attention: Impaired Sustained Attention Impairment: Verbal basic;Functional basic Memory: Impaired Memory Impairment: Decreased recall of new information;Storage deficit Awareness: Impaired Awareness Impairment: Anticipatory impairment Problem Solving: Impaired Problem Solving Impairment: Functional basic;Verbal basic Safety/Judgment: Impaired Comments: pt still requires cueing for safety awareness Sensation Sensation Light Touch: Appears Intact Coordination Gross Motor Movements are Fluid and Coordinated: Yes Fine Motor Movements are Fluid and Coordinated: Yes Motor  Motor Motor: Abnormal postural alignment and control Motor - Discharge Observations: generalized deconditioning, mild R hemiparesis Mobility  Bed Mobility Bed Mobility: Supine to Sit Sit to Supine: Supervision/Verbal cueing Transfers Sit to Stand: Supervision/Verbal cueing Stand to Sit: Supervision/Verbal cueing  Trunk/Postural Assessment  Cervical Assessment Cervical Assessment: Within Functional Limits Thoracic Assessment Thoracic Assessment: Within Functional Limits Lumbar Assessment Lumbar Assessment: Within Functional Limits Postural Control Postural Control: Deficits on evaluation Righting Reactions:  delayed Protective Responses: delayed  Balance Balance Balance Assessed: Yes Static Sitting Balance Static Sitting - Balance Support: No upper extremity supported;Feet supported Static Sitting - Level of Assistance: 6: Modified independent (Device/Increase time) Dynamic Sitting Balance Dynamic Sitting - Balance Support: Feet supported;During functional activity Dynamic Sitting - Level of Assistance: 6: Modified independent (Device/Increase time) Static Standing Balance Static Standing - Balance Support: During functional activity;No upper extremity supported Static Standing - Level of Assistance: 5: Stand by assistance Dynamic Standing Balance Dynamic Standing - Balance Support: No upper extremity supported;During functional activity Dynamic Standing -  Level of Assistance: 5: Stand by assistance Extremity/Trunk Assessment RUE Assessment RUE Assessment: Within Functional Limits LUE Assessment LUE Assessment: Within Functional Limits   Curtis Sites 09/29/2018, 4:56 PM

## 2018-09-29 NOTE — Progress Notes (Signed)
[facility] PHYSICAL MEDICINE & REHABILITATION PROGRESS NOTE   Subjective/Complaints: Had a good night. Denies pain. Anxious to get home  ROS: Patient denies fever, rash, sore throat, blurred vision, nausea, vomiting, diarrhea, cough, shortness of breath or chest pain, joint or back pain, headache, or mood change.   Objective:   No results found. No results for input(s): WBC, HGB, HCT, PLT in the last 72 hours. No results for input(s): NA, K, CL, CO2, GLUCOSE, BUN, CREATININE, CALCIUM in the last 72 hours.  Intake/Output Summary (Last 24 hours) at 09/29/2018 0922 Last data filed at 09/28/2018 1841 Gross per 24 hour  Intake 480 ml  Output -  Net 480 ml     Physical Exam: Vital Signs Blood pressure 112/64, pulse 83, temperature 98 F (36.7 C), temperature source Oral, resp. rate 16, height 6' 1.5" (1.867 m), weight 68.2 kg, SpO2 95 %. Constitutional: No distress . Vital signs reviewed. HEENT: EOMI, oral membranes moist Neck: supple Cardiovascular: RRR without murmur. No JVD    Respiratory: CTA Bilaterally without wheezes or rales. Normal effort    GI: BS +, non-tender, non-distended  Neurological: Alert, oriented to person, place. Follows commands. STM deficits.  RUE 4/5. LUE 5/5. RLE: 3/5 HF, KE and 4/5 ADF/PF. LLE: 4/5 HF, KE and 5/5 ADF/PF. No focal sensory deficitsnoted Skin: Skin iswarm.  Psychiatric:pleasant    Assessment/Plan: 1. Functional deficits secondary to left frontal hemorrhage which require 3+ hours per day of interdisciplinary therapy in a comprehensive inpatient rehab setting.  Physiatrist is providing close team supervision and 24 hour management of active medical problems listed below.  Physiatrist and rehab team continue to assess barriers to discharge/monitor patient progress toward functional and medical goals  Care Tool:  Bathing    Body parts bathed by patient: Right arm, Left upper leg, Right lower leg, Left arm, Chest, Abdomen, Left lower  leg, Face, Front perineal area, Right upper leg, Buttocks         Bathing assist Assist Level: Supervision/Verbal cueing     Upper Body Dressing/Undressing Upper body dressing   What is the patient wearing?: Pull over shirt    Upper body assist Assist Level: Supervision/Verbal cueing    Lower Body Dressing/Undressing Lower body dressing      What is the patient wearing?: Incontinence brief, Pants     Lower body assist Assist for lower body dressing: Supervision/Verbal cueing     Toileting Toileting    Toileting assist Assist for toileting: Supervision/Verbal cueing     Transfers Chair/bed transfer  Transfers assist     Chair/bed transfer assist level: Supervision/Verbal cueing     Locomotion Ambulation   Ambulation assist      Assist level: Supervision/Verbal cueing Assistive device: No Device Max distance: 150 ft   Walk 10 feet activity   Assist     Assist level: Supervision/Verbal cueing Assistive device: No Device   Walk 50 feet activity   Assist Walk 50 feet with 2 turns activity did not occur: Safety/medical concerns  Assist level: Supervision/Verbal cueing Assistive device: No Device    Walk 150 feet activity   Assist Walk 150 feet activity did not occur: Safety/medical concerns  Assist level: Supervision/Verbal cueing Assistive device: No Device    Walk 10 feet on uneven surface  activity   Assist     Assist level: Moderate Assistance - Patient - 50 - 74% Assistive device: (none)   Wheelchair     Assist Will patient use wheelchair at discharge?: No Type of  Wheelchair: Educational psychologist activity did not occur: N/A  Wheelchair assist level: Supervision/Verbal cueing Max wheelchair distance: 44'    Wheelchair 50 feet with 2 turns activity    Assist    Wheelchair 50 feet with 2 turns activity did not occur: N/A   Assist Level: Supervision/Verbal cueing   Wheelchair 150 feet activity     Assist  Wheelchair 150 feet activity did not occur: N/A        Medical Problem List and Plan: 1.Decreased functional mobility with cognitive deficitssecondary to left frontal hemorrhage secondary to hypertensive crisis and multiple medical issues --Continue CIR therapies including PT, OT, and SLP   -ELOS 6/17--team conf today  -Patient to see Rehab MD/provider in the office for transitional care encounter in 2-4 weeks.   -spoke to brothernlaw at length yesterday 2. Antithrombotics: -DVT/anticoagulation:no treatment of PE d/t ICH -SCDs. Venous Dopplers negative -antiplatelet therapy: aspirin 81 mg daily 3. Pain Management:Tylenol as needed 4. Mood:Provide emotional support -antipsychotic agents: continue seroquel 50mg  qhs --seems to be effective    5. Neuropsych: This patientis notcapable of making decisions on hisown behalf. 6. Skin/Wound Care:Routine skin checks  7. Fluids/Electrolytes/Nutrition:reasonable PO so far  -repleting K+   8.History of COPD. Continue nebulizers. Followed by Dr. Chase Caller  -still SOB at baseline 9.Hypertension.Bidil20-37.5 mg 3 times daily 10.Acute diastolic congestive heart failure. Lasix 20 mg daily. Monitor for any signs of fluid overload -daily weights have been inconsistent. [name] eating well  Filed Weights   09/27/18 0558 09/28/18 0521 09/29/18 0408  Weight: 72.1 kg 67.6 kg 68.2 kg    11.Small left lower lobe pulmonary embolism identified on CT angiogram of the chest 08/31/2018. No obvious signs of hemodynamic instability. No plans for anticoagulation.    LOS: 8 days A FACE TO FACE EVALUATION WAS PERFORMED  Meredith Staggers 09/29/2018, 9:22 AM

## 2018-09-29 NOTE — Progress Notes (Signed)
Pt not available for treatments. RT will check back in at a later time.

## 2018-09-29 NOTE — Progress Notes (Signed)
Occupational Therapy Session Note  Patient Details  Name: Jeremiah [name] MRN: [id number] Date of Birth: 07-10-1945  Today's Date: 09/29/2018 OT Individual Time: 1300-1350 OT Individual Time Calculation (min): 50 min    Short Term Goals: Week 1:  OT Short Term Goal 1 (Week 1): STGs=LTGs secondary to short LOS  Skilled Therapeutic Interventions/Progress Updates:    Pt edu on energy conservation strategies in the home, fall risk reduction, and use of DME. Pt completed shower and dressing at (S) level. Pt still requires intermittent cueing for remaining seated when donning/doffing LB clothing. Pt donned robe to wait for wife to bring more clothing. Pt completed grooming task EOB sit <> stand with (S). Reviewed edu from prior session and pt demonstrated improved awareness as he asked for things to be written down in case he forgot.  Pt's wife was running late and made it to last 5 minutes of this scheduled family education session. Briefly edu on shower chair, fall prevention strategies, and remaining pt deficits. She was agreeable to all and all questions answered. Pt passed off to PT in room.   Therapy Documentation Precautions:  Precautions Precautions: Fall Precaution Comments: impulsive Restrictions Weight Bearing Restrictions: No   Therapy/Group: Individual Therapy  Curtis Sites 09/29/2018, 7:22 AM

## 2018-09-29 NOTE — Progress Notes (Signed)
Speech Language Pathology Discharge Summary  Patient Details  Name: Jeremiah [name] MRN: [id number] Date of Birth: 12-05-1945  Today's Date: 09/29/2018 SLP Individual Time: 7793-9030 SLP Individual Time Calculation (min): 40 min   Skilled Therapeutic Interventions:  Skilled ST services focused on education and cognitive skills. Pt's wife was not present for education. Pt demonstrated orientation to place and time with min a verbal cues for visual aid. SLP facilitated reassessment of cognitive linguistic skills with administration of MOCA version 7.3 (n=>26), pt scored 13 out 30, demonstrating 3 point increase from evaluation date. SLP provided education on cognitive improvements and continued deficits in in basic problem solving, error awareness, sustained attention, and orientation primarily impacted by recall deficits. All questions answered to satisfaction. Pt was left in room with call bell within reach and chair alarm set. ST recommends to continue skilled ST services.     Patient has met 7 of 7 long term goals.  Patient to discharge at overall Mod;Modified Independent;Min level.  Reasons goals not met:     Clinical Impression/Discharge Summary:   Pt made great progress meeting 7 out 7 goals. Pt demonstrates mod I swallow strategies with upgraded diet of regular and thin. Pt demonstrated improvement in orientation given visual aid for reference and memory strategies utilizing memory notebook. Pt demonstrated improvement in cognitive linguistic skills on formal assessment, initial evaluation scoring 10 out 30 on MOCA version 7.1 and at discharge scoring 13 out 30 on MOCA version 7.3. Pt continues to demonstrates impaired auditory comprehension, basic problem solving, error awareness, orientation and sustained attention, largely impacted by memory deficits. Education was provided to pt including handout on memory strategies and diet options with/wothout dentures in. Pt would continue benefit from  skilled ST services in order to maximize functional independence and reduce burden of care, requiring supervision and continue ST services.  Care Partner:  Caregiver Able to Provide Assistance: Yes  Type of Caregiver Assistance: Physical;Cognitive  Recommendation:  Home Health SLP;24 hour supervision/assistance  Rationale for SLP Follow Up: Maximize cognitive function and independence;Reduce caregiver burden   Equipment: N/A   Reasons for discharge: Discharged from hospital   Patient/Family Agrees with Progress Made and Goals Achieved: Yes    [name]  [facility] 09/29/2018, 3:51 PM

## 2018-09-29 NOTE — Progress Notes (Addendum)
  Patient ID: Helder Crisafulli, male   DOB: June 01, 1945, 73 y.o.   MRN: [id number]      Diagnosis codes:  J96.01;  I61.1;  I50.43  Height:  6'3"              Weight:  159 lbs         Patient suffers from respiratory failure with hypoxia, ICH and COPD  which impairs their ability to perform daily activities like dressing and mobility in the home.  A rolling walker will not resolve issue with performing activities of daily living.  A wheelchair will allow patient to safely perform daily activities.  Patient is not able to propel themselves in the home using a standard weight wheelchair due to general weakness.  Patient has a caregiver is available, willing and able to provide assistance with the wheelchair.   Lauraine Rinne, PA

## 2018-09-30 DIAGNOSIS — I610 Nontraumatic intracerebral hemorrhage in hemisphere, subcortical: Secondary | ICD-10-CM

## 2018-09-30 NOTE — Progress Notes (Signed)
Telesitter discontinued due to patient being discharged today.

## 2018-09-30 NOTE — Progress Notes (Signed)
Patient and spouse received discharge instructions from Marlowe Shores, PA-C with verbal understanding. Patient discharged to home with spouse and patient belongings.

## 2018-09-30 NOTE — Discharge Instructions (Signed)
Inpatient Rehab Discharge Instructions  Jeremiah [name] Discharge date and time: No discharge date for patient encounter.   Activities/Precautions/ Functional Status: Activity: activity as tolerated Diet: soft Wound Care: none needed Functional status:  ___ No restrictions     ___ Walk up steps independently ___ 24/7 supervision/assistance   ___ Walk up steps with assistance ___ Intermittent supervision/assistance  ___ Bathe/dress independently ___ Walk with walker     _x__ Bathe/dress with assistance ___ Walk Independently    ___ Shower independently ___ Walk with assistance    ___ Shower with assistance ___ No alcohol     ___ Return to work/school ________    COMMUNITY REFERRALS UPON DISCHARGE:    Home Health:   PT     OT     ST                      Agency:  [facility]   Phone: [phone]   Medical Equipment/Items Ordered:  Transport wheelchair, tub seat                                                       Agency/Supplier:  [facility] @ [phone]       Special Instructions: No driving   My questions have been answered and I understand these instructions. I will adhere to these goals and the provided educational materials after my discharge from the hospital.  Patient/Caregiver Signature _______________________________ Date __________  Clinician Signature _______________________________________ Date __________  Please bring this form and your medication list with you to all your follow-up doctor's appointments.

## 2018-09-30 NOTE — Patient Care Conference (Signed)
Inpatient RehabilitationTeam Conference and Plan of Care Update Date: 09/29/2018   Time:  2:45 PM   Patient Name: Jeremiah [name]      Medical Record Number: [id number]  Date of Birth: 02-18-1946 Sex: Male         Room/Bed: 4W14C/4W14C-01 Payor Info: Payor: MEDICARE / Plan: MEDICARE PART A AND B / Product Type: *No Product type* /    Admitting Diagnosis: 2. TBI Team  Lt CVA; non-traumatic ICH, 13-15days  Admit Date/Time:  09/21/2018  3:30 PM Admission Comments: No comment available   Primary Diagnosis:  <principal problem not specified> Principal Problem: <principal problem not specified>  Patient Active Problem List   Diagnosis Date Noted  . ICH (intracerebral hemorrhage) ([place]) 09/21/2018  . Delirium due to another medical condition, acute, hyperactive 09/14/2018  . Dementia ([place]) 09/14/2018  . Acute combined systolic and diastolic heart failure ([place]) 09/07/2018  . Acute renal failure with acute cortical necrosis ([place]) 09/07/2018  . Pulmonary embolism ([place])   . Intracerebral hemorrhage 08/31/2018  . Chronic rhinitis 03/02/2018  . Chronic cough 01/22/2017  . Constipation 01/01/2016  . Acute URI 03/13/2015  . Medicare annual wellness visit, subsequent 12/28/2014  . Essential hypertension 12/12/2014  . Generalized anxiety disorder 11/02/2014  . OSA (obstructive sleep apnea) 04/26/2014  . COPD, frequent exacerbations ([place]) 04/25/2013    Expected Discharge Date: Expected Discharge Date: 09/30/18  Team Members Present: Physician leading conference: Dr. Alger Simons Social Worker Present: Lennart Pall, LCSW Nurse Present: Dorthula Nettles, RN PT Present: Lavone Nian, PT OT Present: Laverle Hobby, OT SLP Present: Charolett Bumpers, SLP PPS Coordinator present : Gunnar Fusi, SLP     Current Status/Progress Goal Weekly Team Focus  Medical   improving sleep patterns, cognitively still with processing delays, stm deficits, pain controlled, SOB at baseline  finalize medical issues  and follow up for discharge  see above   Bowel/Bladder   Continent of bowel/bladder; LBM 09/27/18  Maintain normal bowel/bladder function  assist wit bowel/bladder need PRN   Swallow/Nutrition/ Hydration   Reg and thin  Mod I - goal meet  carryover of strategies   ADL's   At goal level- supervision with ADLs  supervision level goals  cognition/safety, dynamic balance, ADLs   Mobility   close supervision for gait without AD, min assist HHA for steps without rails, improving memory/awareness  supervision overall with LRAD except steps with min assist without rails  balance, NMR, transfers, cognitive remediation, d/c planning, caregiver training, endurance, stairs, gait   Communication             Safety/Cognition/ Behavioral Observations  Mod-Min A  Min-Mod A - goal met  memory notebook, problem soving, sustained attention orientation   Pain   No pain  <2  Assess and treat pain q shift and ad needed   Skin   No skin issue  maintain skin integrity  Assess skin q shift and as needed    Rehab Goals Patient on target to meet rehab goals: Yes *See Care Plan and progress notes for long and short-term goals.     Barriers to Discharge  Current Status/Progress Possible Resolutions Date Resolved   Physician    Medical stability;Behavior        supervision at home      Nursing                  PT  Decreased caregiver support;Home environment access/layout;Behavior  2 STE without rails, impaired cognition  OT                  SLP                SW                Discharge Planning/Teaching Needs:  Home with family able to provide 24/7 assistance.  Teaching with wife taking place this afternoon   Team Discussion:  Ready for d/c tomorrow.  Family ed completed.  Improved cognition overall.  Has met supervision goals.   Revisions to Treatment Plan:  NA    Continued Need for Acute Rehabilitation Level of Care: The patient requires daily medical management by a physician  with specialized training in physical medicine and rehabilitation for the following conditions: Daily direction of a multidisciplinary physical rehabilitation program to ensure safe treatment while eliciting the highest outcome that is of practical value to the patient.: Yes Daily medical management of patient stability for increased activity during participation in an intensive rehabilitation regime.: Yes Daily analysis of laboratory values and/or radiology reports with any subsequent need for medication adjustment of medical intervention for : Neurological problems;Pulmonary problems;Cardiac problems   I attest that I was present, lead the team conference, and concur with the assessment and plan of the team.   Lennart Pall 09/30/2018, 10:37 AM    Team conference was held via web/ teleconference due to [place] - 19

## 2018-09-30 NOTE — Progress Notes (Signed)
Social Work  Discharge Note  The overall goal for the admission was met for:   Discharge location: Yes - d/c home with wife who will provide 24/7 supervision  Length of Stay: Yes - 9 days  Discharge activity level: Yes - supervision overall  Home/community participation: Yes  Services provided included: MD, RD, PT, OT, SLP, RN, Pharmacy and [place]: Medicare and Private Insurance: [employer]  Follow-up services arranged: Home Health: PT, OT, ST via [facility], DME: transport w/c, tub seat via [facility] and Patient/Family has no preference for HH/DME agencies  Comments (or additional information):         (Pt with cognitive impairment)       Wife, Jeremiah [name] @ [phone]  Patient/Family verbalized understanding of follow-up arrangements: Yes  Individual responsible for coordination of the follow-up plan: pt/ spouse  Confirmed correct DME delivered: Lennart Pall 09/30/2018    Jeremiah [name]

## 2018-09-30 NOTE — Progress Notes (Signed)
[facility] PHYSICAL MEDICINE & REHABILITATION PROGRESS NOTE   Subjective/Complaints: Pt up at EOB. Feeling well. Excited about going home ROS: Patient denies fever, rash, sore throat, blurred vision, nausea, vomiting, diarrhea, cough, shortness of breath or chest pain, joint or back pain, headache, or mood change.    Objective:   No results found. No results for input(s): WBC, HGB, HCT, PLT in the last 72 hours. No results for input(s): NA, K, CL, CO2, GLUCOSE, BUN, CREATININE, CALCIUM in the last 72 hours.  Intake/Output Summary (Last 24 hours) at 09/30/2018 0840 Last data filed at 09/29/2018 1824 Gross per 24 hour  Intake 600 ml  Output -  Net 600 ml     Physical Exam: Vital Signs Blood pressure 135/78, pulse 74, temperature 98.3 F (36.8 C), temperature source Oral, resp. rate 16, height 6' 1.5" (1.867 m), weight 68.2 kg, SpO2 94 %. Constitutional: No distress . Vital signs reviewed. HEENT: EOMI, oral membranes moist Neck: supple Cardiovascular: RRR without murmur. No JVD    Respiratory: CTA Bilaterally without wheezes or rales. Normal effort    GI: BS +, non-tender, non-distended    Neurological: Alert, oriented to person, place. Follows commands. STM deficits but improving.  RUE 4/5. LUE 5/5. RLE: 3/5 HF, KE and 4/5 ADF/PF. LLE: 4/5 HF, KE and 5/5 ADF/PF. No focal sensory deficitsstable  Skin: Skin iswarm.  Psychiatric:pleasant    Assessment/Plan: 1. Functional deficits secondary to left frontal hemorrhage which require 3+ hours per day of interdisciplinary therapy in a comprehensive inpatient rehab setting.  Physiatrist is providing close team supervision and 24 hour management of active medical problems listed below.  Physiatrist and rehab team continue to assess barriers to discharge/monitor patient progress toward functional and medical goals  Care Tool:  Bathing    Body parts bathed by patient: Right arm, Left upper leg, Right lower leg, Left arm,  Chest, Abdomen, Left lower leg, Face, Front perineal area, Right upper leg, Buttocks         Bathing assist Assist Level: Supervision/Verbal cueing     Upper Body Dressing/Undressing Upper body dressing   What is the patient wearing?: Pull over shirt    Upper body assist Assist Level: Independent    Lower Body Dressing/Undressing Lower body dressing      What is the patient wearing?: Incontinence brief, Pants     Lower body assist Assist for lower body dressing: Supervision/Verbal cueing     Toileting Toileting    Toileting assist Assist for toileting: Supervision/Verbal cueing     Transfers Chair/bed transfer  Transfers assist     Chair/bed transfer assist level: Supervision/Verbal cueing     Locomotion Ambulation   Ambulation assist      Assist level: Supervision/Verbal cueing Assistive device: No Device Max distance: 150 ft   Walk 10 feet activity   Assist     Assist level: Supervision/Verbal cueing Assistive device: No Device   Walk 50 feet activity   Assist Walk 50 feet with 2 turns activity did not occur: Safety/medical concerns  Assist level: Supervision/Verbal cueing Assistive device: No Device    Walk 150 feet activity   Assist Walk 150 feet activity did not occur: Safety/medical concerns  Assist level: Supervision/Verbal cueing Assistive device: No Device    Walk 10 feet on uneven surface  activity   Assist     Assist level: Contact Guard/Touching assist Assistive device: (rail, HHA)   Wheelchair     Assist Will patient use wheelchair at discharge?: No Type of  Wheelchair: Educational psychologist activity did not occur: N/A  Wheelchair assist level: Supervision/Verbal cueing Max wheelchair distance: 71'    Wheelchair 50 feet with 2 turns activity    Assist    Wheelchair 50 feet with 2 turns activity did not occur: N/A   Assist Level: Supervision/Verbal cueing   Wheelchair 150 feet activity      Assist Wheelchair 150 feet activity did not occur: N/A        Medical Problem List and Plan: 1.Decreased functional mobility with cognitive deficitssecondary to left frontal hemorrhage secondary to hypertensive crisis and multiple medical issues --dc home today  -Patient to see Rehab MD/provider in the office for transitional care encounter in 2-4 weeks.   -needs cards/pulmonary follow up as outpt 2. Antithrombotics: -DVT/anticoagulation:no treatment of PE d/t ICH -SCDs. Venous Dopplers negative -antiplatelet therapy: aspirin 81 mg daily 3. Pain Management:Tylenol as needed 4. Mood:Provide emotional support -antipsychotic agents: continue seroquel 50mg  qhs --seems to be effective    5. Neuropsych: This patientis notcapable of making decisions on hisown behalf. 6. Skin/Wound Care:Routine skin checks  7. Fluids/Electrolytes/Nutrition:reasonable PO so far  -kdur   8.History of COPD. Continue nebulizers. Followed by Dr. Chase Caller  -still SOB at baseline 9.Hypertension.Bidil20-37.5 mg 3 times daily 10.Acute diastolic congestive heart failure. Lasix 20 mg daily. Monitor for any signs of fluid overload -daily weights have been inconsistent. [name] eating well  Filed Weights   09/28/18 0521 09/29/18 0408 09/30/18 0556  Weight: 67.6 kg 68.2 kg 68.2 kg    11.Small left lower lobe pulmonary embolism identified on CT angiogram of the chest 08/31/2018. No obvious signs of hemodynamic instability. No plans for anticoagulation.    LOS: 9 days A FACE TO FACE EVALUATION WAS PERFORMED  Jeremiah [name] 09/30/2018, 8:40 AM

## 2018-10-02 ENCOUNTER — Telehealth: Payer: Self-pay

## 2018-10-02 NOTE — Telephone Encounter (Signed)
Transitional Care call-who you talked with- wife Jeremiah [name]    1. Are you/is patient experiencing any problems since coming home? No Are there any questions regarding any aspect of care? No 2. Are there any questions regarding medications administration/dosing? No Are meds being taken as prescribed? Yes Patient should review meds with caller to confirm 3. Have there been any falls? No 4. Has Home Health been to the house and/or have they contacted you? Yes If not, have you tried to contact them? Can we help you contact them? 5. Are bowels and bladder emptying properly? Yes Are there any unexpected incontinence issues? No If applicable, is patient following bowel/bladder programs? 6. Any fevers, problems with breathing, unexpected pain? No 7. Are there any skin problems or new areas of breakdown? No 8. Has the patient/family member arranged specialty MD follow up (ie cardiology/neurology/renal/surgical/etc)? Yes Can we help arrange? 9. Does the patient need any other services or support that we can help arrange? No 10. Are caregivers following through as expected in assisting the patient? Yes 11. Has the patient quit smoking, drinking alcohol, or using drugs as recommended? Yes  Appointment time 10:40 am arrive time 10:20 am on 10/13/2018 with Danella Sensing, NP [place]

## 2018-10-08 ENCOUNTER — Telehealth: Payer: Self-pay | Admitting: Cardiology

## 2018-10-08 NOTE — Telephone Encounter (Signed)

## 2018-10-09 ENCOUNTER — Ambulatory Visit (INDEPENDENT_AMBULATORY_CARE_PROVIDER_SITE_OTHER): Payer: Medicare Other | Admitting: Adult Health

## 2018-10-09 ENCOUNTER — Encounter: Payer: Self-pay | Admitting: Cardiology

## 2018-10-09 ENCOUNTER — Encounter: Payer: Self-pay | Admitting: Adult Health

## 2018-10-09 ENCOUNTER — Ambulatory Visit (INDEPENDENT_AMBULATORY_CARE_PROVIDER_SITE_OTHER): Payer: Medicare Other | Admitting: Cardiology

## 2018-10-09 ENCOUNTER — Other Ambulatory Visit: Payer: Self-pay

## 2018-10-09 VITALS — BP 110/64 | HR 94 | Temp 98.4°F | Ht 73.5 in | Wt 156.0 lb

## 2018-10-09 VITALS — BP 164/72 | HR 73 | Temp 97.5°F | Ht 73.5 in | Wt 156.4 lb

## 2018-10-09 DIAGNOSIS — I5181 Takotsubo syndrome: Secondary | ICD-10-CM | POA: Diagnosis not present

## 2018-10-09 DIAGNOSIS — J449 Chronic obstructive pulmonary disease, unspecified: Secondary | ICD-10-CM

## 2018-10-09 DIAGNOSIS — I2699 Other pulmonary embolism without acute cor pulmonale: Secondary | ICD-10-CM | POA: Diagnosis not present

## 2018-10-09 DIAGNOSIS — E7800 Pure hypercholesterolemia, unspecified: Secondary | ICD-10-CM | POA: Diagnosis not present

## 2018-10-09 DIAGNOSIS — I10 Essential (primary) hypertension: Secondary | ICD-10-CM | POA: Diagnosis not present

## 2018-10-09 DIAGNOSIS — J441 Chronic obstructive pulmonary disease with (acute) exacerbation: Secondary | ICD-10-CM | POA: Diagnosis not present

## 2018-10-09 NOTE — Assessment & Plan Note (Addendum)
Small PE noted on CT chest.  Venous Dopplers were negative for DVT.  Patient was unable to do anticoagulation due to intracranial hemorrhage..  he appears stable.  No hypoxemia with ambulation or at rest. Continue to monitor. Follow-up echo showed no RV dysfunction

## 2018-10-09 NOTE — Patient Instructions (Addendum)
Continue on TRELEGY  daily . Brush/rinse after use.  Continue on Mucinex DM Twice daily  -use with flutter valve .  Set up for an overnight oximetry test on room air .  Continue on PT at home .  Activity as tolerated.  Follow up with Dr. Chase Caller or [place] in 6-8 weeks and As needed   Please contact office for sooner follow up if symptoms do not improve or worsen or seek emergency care

## 2018-10-09 NOTE — Progress Notes (Signed)
@Patient  ID: Jeremiah [name], male    DOB: October 07, 1945, 73 y.o.   MRN: [id number]  Chief Complaint  Patient presents with   Follow-up    COPD     Referring provider: Selena Lesser, MD  HPI: 73yo male former smoker followed for GOLD III COPD  TEST/EVENTS :  PFTs 10/12/2015FEV1 1.30 (35%) ratio 49 with severe air trapping and no better p saba and dlco 48% -- referred to rehab 01/24/14 -completed -Med calendar 02/21/2014>did not recognize it when reprinted 03/29/14 >reviewed , 08/03/2014, 04/28/2015 CT chest 02/2016 no ILD , emphysema changes  10/09/2018 Follow up : COPD , Post hospital follow up  Patient returns for a follow-up visit.  Patient is recently been hospitalized last month for a prolonged illness. He was admitted with a COPD exacerbation.  He required intubation and ventilator support initially.  CT chest showed incidental finding of a small left lower lobe PE.  CT head showed a small 5 mm left frontal lobe hemorrhage.  He was not treated with anticoagulation.  Lower extremity Dopplers were negative for DVT.  Echo showed EF of 20 to 25% suspect stress-induced cardiomyopathy.  Patient requires significant medications for agitation felt secondary to underlying dementia..  Patient was discharged to inpatient rehab. Patient has been discharged home.  Wife is taking care of him.  Patient says since discharge he is starting to feel better.  Breathing is starting to improve some.  Did not require oxygen at discharge.  He denies any increased cough or congestion.  Says he is just very weak.  Is planning on physical therapy at home.  Has seen cardiology repeat echo showed improved recovery with EF at 60% and grade 1 diastolic dysfunction.   No Known Allergies  Immunization History  Administered Date(s) Administered   Influenza Split 01/13/2013   Influenza, High Dose Seasonal PF 12/28/2014, 01/15/2016, 01/13/2017, 01/16/2018   Influenza,inj,Quad PF,6+ Mos 01/24/2014    Pneumococcal Conjugate-13 12/08/2017   Pneumococcal-Unspecified 04/15/2010   Tdap 12/28/2014    Past Medical History:  Diagnosis Date   COPD (chronic obstructive pulmonary disease) (HCC)    Depression    Dilated cardiomyopathy (HCC)    Hypertension    PTSD (post-traumatic stress disorder)    Sleep apnea     Tobacco History: Social History   Tobacco Use  Smoking Status Former Smoker   Packs/day: 1.00   Years: 30.00   Pack years: 30.00   Types: Cigarettes   Quit date: 04/16/1987   Years since quitting: 31.5  Smokeless Tobacco Never Used   Counseling given: Not Answered   Outpatient Medications Prior to Visit  Medication Sig Dispense Refill   acetaminophen (TYLENOL) 325 MG tablet Take 2 tablets (650 mg total) by mouth every 6 (six) hours as needed for mild pain.     albuterol (VENTOLIN HFA) 108 (90 Base) MCG/ACT inhaler Inhale 2 puffs into the lungs every 4 (four) hours as needed for wheezing or shortness of breath (((PLAN B))). 1 Inhaler 5   aspirin EC 81 MG tablet Take 81 mg by mouth daily.     carbamazepine (CARBATROL) 200 MG 12 hr capsule Take by mouth.     esomeprazole (NEXIUM) 40 MG capsule Take 1 capsule (40 mg total) by mouth daily before breakfast. 30 capsule 2   finasteride (PROSCAR) 5 MG tablet Take by mouth.     Fluticasone-Umeclidin-Vilant (TRELEGY ELLIPTA) 100-62.5-25 MCG/INH AEPB Inhale 1 puff into the lungs daily. 1 each 1   furosemide (LASIX) 20 MG tablet Take  1 tablet (20 mg total) by mouth daily. 30 tablet 0   isosorbide-hydrALAZINE (BIDIL) 20-37.5 MG tablet Take 1 tablet by mouth 3 (three) times daily. 90 tablet 0   Multiple Vitamin (MULTIVITAMIN WITH MINERALS) TABS tablet Take 1 tablet by mouth daily. 30 tablet 0   oxybutynin (DITROPAN) 5 MG tablet Take 5 mg by mouth 3 (three) times daily.     pravastatin (PRAVACHOL) 10 MG tablet Take 1 tablet (10 mg total) by mouth daily. 30 tablet 0   QUEtiapine (SEROQUEL) 50 MG tablet Take 1  tablet (50 mg total) by mouth at bedtime. 30 tablet 0   QUEtiapine (SEROQUEL) 50 MG tablet Take by mouth.     No facility-administered medications prior to visit.      Review of Systems:   Constitutional:   No  weight loss, night sweats,  Fevers, chills,  +fatigue, or  lassitude.  HEENT:   No headaches,  Difficulty swallowing,  Tooth/dental problems, or  Sore throat,                No sneezing, itching, ear ache, nasal congestion, post nasal drip,   CV:  No chest pain,  Orthopnea, PND, swelling in lower extremities, anasarca, dizziness, palpitations, syncope.   GI  No heartburn, indigestion, abdominal pain, nausea, vomiting, diarrhea, change in bowel habits, loss of appetite, bloody stools.   Resp:  .  No chest wall deformity  Skin: no rash or lesions.  GU: no dysuria, change in color of urine, no urgency or frequency.  No flank pain, no hematuria   MS:  No joint pain or swelling.  No decreased range of motion.  No back pain.    Physical Exam  BP 110/64 (BP Location: Left Arm, Cuff Size: Normal)    Pulse 94    Temp 98.4 F (36.9 C) (Oral)    Ht 6' 1.5" (1.867 m)    Wt 156 lb (70.8 kg)    SpO2 96%    BMI 20.30 kg/m   GEN: A/Ox3; pleasant , NAD, elderly frail in wheelchair   HEENT:  [place]/AT, NOSE-clear, THROAT-clear, no lesions, no postnasal drip or exudate noted.   NECK:  Supple w/ fair ROM; no JVD; normal carotid impulses w/o bruits; no thyromegaly or nodules palpated; no lymphadenopathy.    RESP diminished breath sounds in the bases with no wheezing  no accessory muscle use, no dullness to percussion  CARD:  RRR, no m/r/g, no peripheral edema, pulses intact, no cyanosis or clubbing.  GI:   Soft & nt; nml bowel sounds; no organomegaly or masses detected.   Musco: Warm bil, no deformities or joint swelling noted.   Neuro: alert, no focal deficits noted.    Skin: Warm, no lesions or rashes    Lab Results:  CBC    Component Value Date/Time   WBC 8.5 09/22/2018  0511   RBC 3.88 (L) 09/22/2018 0511   HGB 11.7 (L) 09/22/2018 0511   HCT 35.4 (L) 09/22/2018 0511   PLT 349 09/22/2018 0511   MCV 91.2 09/22/2018 0511   MCH 30.2 09/22/2018 0511   MCHC 33.1 09/22/2018 0511   RDW 12.2 09/22/2018 0511   LYMPHSABS 2.1 09/22/2018 0511   MONOABS 1.1 (H) 09/22/2018 0511   EOSABS 0.6 (H) 09/22/2018 0511   BASOSABS 0.1 09/22/2018 0511    BMET    Component Value Date/Time   NA 138 09/22/2018 0511   K 3.4 (L) 09/22/2018 0511   CL 104 09/22/2018 0511   CO2  25 09/22/2018 0511   GLUCOSE 96 09/22/2018 0511   BUN 11 09/22/2018 0511   CREATININE 1.23 09/22/2018 0511   CALCIUM 9.1 09/22/2018 0511   GFRNONAA 59 (L) 09/22/2018 0511   GFRAA >60 09/22/2018 0511    BNP    Component Value Date/Time   BNP 193.1 (H) 09/12/2018 0430    ProBNP    Component Value Date/Time   PROBNP <5.0 04/24/2013 2243    Imaging: Dg Chest 1 View  Result Date: 09/14/2018 CLINICAL DATA:  73 year old male with recent respiratory failure, pneumonia. EXAM: CHEST  1 VIEW COMPARISON:  09/07/2018 and earlier. FINDINGS: Portable AP semi upright view at 0420 hours. Extubated. Enteric feeding tube remains in place. There is a new right upper extremity approach PICC line with tip at the lower SVC level. Mediastinal contours are stable and within normal limits. Lung volumes are stable. Allowing for portable technique the lungs are clear. Incidental nipple shadows. Chronic left rib fractures again noted. IMPRESSION: 1. Extubated. Enteric feeding tube remains in place. Right upper extremity approach PICC line tip at the lower SVC level. 2. Stable lung volumes.  No acute cardiopulmonary abnormality. Electronically Signed   By: Genevie Ann M.D.   On: 09/14/2018 05:21   Dg Abd Portable 1v  Result Date: 09/16/2018 CLINICAL DATA:  Encounter for nasogastric tube placement. EXAM: PORTABLE ABDOMEN - 1 VIEW COMPARISON:  None. FINDINGS: The bowel gas pattern is normal. Distal tip of nasogastric tube is seen  in expected position of distal stomach. No radio-opaque calculi or other significant radiographic abnormality are seen. IMPRESSION: Distal tip of nasogastric tube seen in expected position of distal stomach. Electronically Signed   By: Marijo Conception M.D.   On: 09/16/2018 15:02      PFT Results Latest Ref Rng & Units 03/22/2016 01/24/2014  FVC-Pre L 3.33 2.66  FVC-Predicted Pre % 79 54  FVC-Post L 3.53 2.75  FVC-Predicted Post % 84 56  Pre FEV1/FVC % % 55 49  Post FEV1/FCV % % 51 46  FEV1-Pre L 1.82 1.30  FEV1-Predicted Pre % 57 35  FEV1-Post L 1.79 1.26  DLCO UNC% % - 48  DLCO COR %Predicted % - 60  TLC L - 11.28  TLC % Predicted % - 151  RV % Predicted % - 306    No results found for: NITRICOXIDE      Assessment & Plan:   COPD, frequent exacerbations (HCC) Recent severe exacerbation with hospitalization requiring intubation and mechanical ventilatory support.  Patient has improved. Patient will continue on his aggressive regimen. Desaturation with ambulation.  Check Joselyn Arrow on room air. Advance activity as tolerated.  PT at home  Plan  Patient Instructions  Continue on TRELEGY  daily . Brush/rinse after use.  Continue on Mucinex DM Twice daily  -use with flutter valve .  Set up for an overnight oximetry test on room air .  Continue on PT at home .  Activity as tolerated.  Follow up with Dr. Chase Caller or Charmelle Soh in 6-8 weeks and As needed   Please contact office for sooner follow up if symptoms do not improve or worsen or seek emergency care         Pulmonary embolism ([place]) Small PE noted on CT chest.  Venous Dopplers were negative for DVT.  Patient was unable to do anticoagulation due to intracranial hemorrhage..  he appears stable.  No hypoxemia with ambulation or at rest. Continue to monitor. Follow-up echo showed no RV dysfunction  Rexene Edison, NP 10/09/2018

## 2018-10-09 NOTE — Patient Instructions (Signed)
Medication Instructions:  Your physician recommends that you continue on your current medications as directed. Please refer to the Current Medication list given to you today. If you need a refill on your cardiac medications before your next appointment, please call your pharmacy.   Lab work: None  If you have labs (blood work) drawn today and your tests are completely normal, you will receive your results only by: [name] Kitchen MyChart Message (if you have MyChart) OR . A paper copy in the mail If you have any lab test that is abnormal or we need to change your treatment, we will call you to review the results.  Testing/Procedures: None   Follow-Up: At [facility], you and your health needs are our priority.  As part of our continuing mission to provide you with exceptional heart care, we have created designated Provider Care Teams.  These Care Teams include your primary Cardiologist (physician) and Advanced Practice Providers (APPs -  Physician Assistants and Nurse Practitioners) who all work together to provide you with the care you need, when you need it. You will need a follow up appointment in 3 months.  Please call our office 2 months in advance to schedule this appointment.  You may see Sanda Klein, MD or one of the following Advanced Practice Providers on your designated Care Team: [place], [place] . Fabian Sharp, PA-C  Any Other Special Instructions Will Be Listed Below (If Applicable). 1. Increase your physical activity 2. Work on stress reduction 3. Continue to eat a low sodium diet    How to Increase Your Level of Physical Activity  Getting regular physical activity is important for your overall health and well-being. Most people do not get enough exercise. There are easy ways to increase your level of physical activity, even if you have not been very active in the past or you are just starting out. Why is physical activity important? Physical activity has many short-term and  long-term health benefits. Regular exercise can:  Help you lose weight or maintain a healthy weight.  Strengthen your muscles and bones.  Boost your mood and improve self-esteem.  Reduce your risk of certain long-term (chronic) diseases, like heart disease, cancer, and diabetes.  Help you stay capable of walking and moving around (mobile) as you age.  Prevent accidents, such as falls, as you age.  Increase life expectancy. What are the benefits of being physically active on a regular basis? In addition to improving your physical health, being physically active on most days of the week can help you in ways that you may not expect. Benefits of regular physical activity may include:  Feeling good about your body.  Being able to move around more easily and for longer periods of time without getting tired (increased stamina).  Finding new sources of fun and enjoyment.  Meeting new people who share a common interest.  Being able to fight off illness better (enhanced immunity).  Being able to sleep better. What can happen if I am not physically active on a regular basis? Not getting enough physical activity can lead to an unhealthy lifestyle and future health problems. This can increase your chances of:  Becoming overweight or obese.  Becoming sick.  Developing chronic illnesses, like heart disease or diabetes.  Having mental health problems, like depression or anxiety.  Having sleep problems.  Having trouble walking or getting yourself around (reduced mobility).  Injuring yourself in a fall as you get older. What steps can I take to be more  physically active?  Check with your health care provider about how to get started. Ask your health care provider what activities are safe for you.  Start out slowly. Walking or doing some simple chair exercises is a good place to start, especially if you have not been active before or for a long time.  Try to find activities that you  enjoy. You are more likely to commit to an exercise routine if it does not feel like a chore.  If you have bone or joint problems, choose low-impact exercises, like walking or swimming.  Include physical activity in your everyday routine.  Invite friends or family members to exercise with you. This also will help you commit to your workout plan.  Set goals that you can work toward.  Aim for at least 150 minutes of moderate-intensity exercise each week. Examples of moderate-intensity exercise include walking or riding a bike. Where to find more information  Centers for Disease Control and Prevention: BowlingGrip.is  President's Council on Fitness, Sports & Nutrition www.http://villegas.org/  ChooseMyPlate: [employer].dk Contact a health care provider if:  You have headaches, muscle aches, or joint pain.  You feel dizzy or light-headed while exercising.  You faint.  You have chest pain while exercising. Summary  Exercise benefits your mind and body at any age, even if you are just starting out.  If you have a chronic illness or have not been active for a while, check with your health care provider before increasing your physical activity.  Choose activities that are safe and enjoyable for you.Ask your health care provider what activities are safe for you.  Start slowly. Tell your health care provider if you have problems as you start to increase your activity level. This information is not intended to replace advice given to you by your health care provider. Make sure you discuss any questions you have with your health care provider. Document Released: 03/21/2016 Document Revised: 03/21/2016 Document Reviewed: 03/21/2016 Elsevier Interactive Patient Education  2019 [place] Stress Reduction Mindfulness-based stress reduction (MBSR) is a program that helps people learn to practice mindfulness.  Mindfulness is the practice of intentionally paying attention to the present moment. It can be learned and practiced through techniques such as education, breathing exercises, meditation, and yoga. MBSR includes several mindfulness techniques in one program. MBSR works best when you understand the treatment, are willing to try new things, and can commit to spending time practicing what you learn. MBSR training may include learning about:  How your emotions, thoughts, and reactions affect your body.  New ways to respond to things that cause negative thoughts to start (triggers).  How to notice your thoughts and let go of them.  Practicing awareness of everyday things that you normally do without thinking.  The techniques and goals of different types of meditation. What are the benefits of MBSR? MBSR can have many benefits, which include helping you to:  Develop self-awareness. This refers to knowing and understanding yourself.  Learn skills and attitudes that help you to participate in your own health care.  Learn new ways to care for yourself.  Be more accepting about how things are, and let things go.  Be less judgmental and approach things with an open mind.  Be patient with yourself and trust yourself more. MBSR has also been shown to:  Reduce negative emotions, such as depression and anxiety.  Improve memory and focus.  Change how you sense and approach pain.  Boost your body's ability  to fight infections.  Help you connect better with other people.  Improve your sense of well-being. Follow these instructions at home:   Find a local in-person or online MBSR program.  Set aside some time regularly for mindfulness practice.  Find a mindfulness practice that works best for you. This may include one or more of the following: ? Meditation. Meditation involves focusing your mind on a certain thought or activity. ? Breathing awareness exercises. These help you to stay  present by focusing on your breath. ? Body scan. For this practice, you lie down and pay attention to each part of your body from head to toe. You can identify tension and soreness and intentionally relax parts of your body. ? Yoga. Yoga involves stretching and breathing, and it can improve your ability to move and be flexible. It can also provide an experience of testing your body's limits, which can help you release stress. ? Mindful eating. This way of eating involves focusing on the taste, texture, color, and smell of each bite of food. Because this slows down eating and helps you feel full sooner, it can be an important part of a weight-loss plan.  Find a podcast or recording that provides guidance for breathing awareness, body scan, or meditation exercises. You can listen to these any time when you have a free moment to rest without distractions.  Follow your treatment plan as told by your health care provider. This may include taking regular medicines and making changes to your diet or lifestyle as recommended. How to practice mindfulness To do a basic awareness exercise:  Find a comfortable place to sit.  Pay attention to the present moment. Observe your thoughts, feelings, and surroundings just as they are.  Avoid placing judgment on yourself, your feelings, or your surroundings. Make note of any judgment that comes up, and let it go.  Your mind may wander, and that is okay. Make note of when your thoughts drift, and return your attention to the present moment. To do basic mindfulness meditation:  Find a comfortable place to sit. This may include a stable chair or a firm floor cushion. ? Sit upright with your back straight. Let your arms fall next to your side with your hands resting on your legs. ? If sitting in a chair, rest your feet flat on the floor. ? If sitting on a cushion, cross your legs in front of you.  Keep your head in a neutral position with your chin dropped  slightly. Relax your jaw and rest the tip of your tongue on the roof of your mouth. Drop your gaze to the floor. You can close your eyes if you like.  Breathe normally and pay attention to your breath. Feel the air moving in and out of your nose. Feel your belly expanding and relaxing with each breath.  Your mind may wander, and that is okay. Make note of when your thoughts drift, and return your attention to your breath.  Avoid placing judgment on yourself, your feelings, or your surroundings. Make note of any judgment or feelings that come up, let them go, and bring your attention back to your breath.  When you are ready, lift your gaze or open your eyes. Pay attention to how your body feels after the meditation. Where to find more information You can find more information about MBSR from:  Your health care provider.  Community-based meditation centers or programs.  Programs offered near you. Summary  Mindfulness-based stress reduction (MBSR) is  a program that teaches you how to intentionally pay attention to the present moment. It is used with other treatments to help you cope better with daily stress, emotions, and pain.  MBSR focuses on developing self-awareness, which allows you to respond to life stress without judgment or negative emotions.  MBSR programs may involve learning different mindfulness practices, such as breathing exercises, meditation, yoga, body scan, or mindful eating. Find a mindfulness practice that works best for you, and set aside time for it on a regular basis. This information is not intended to replace advice given to you by your health care provider. Make sure you discuss any questions you have with your health care provider. Document Released: 08/08/2016 Document Revised: 08/08/2016 Document Reviewed: 08/08/2016 Elsevier Interactive Patient Education  2019 [place].   Low-Sodium Eating Plan Sodium, which is an element that makes up salt, helps you  maintain a healthy balance of fluids in your body. Too much sodium can increase your blood pressure and cause fluid and waste to be held in your body. Your health care provider or dietitian may recommend following this plan if you have high blood pressure (hypertension), kidney disease, liver disease, or heart failure. Eating less sodium can help lower your blood pressure, reduce swelling, and protect your heart, liver, and kidneys. What are tips for following this plan? General guidelines  Most people on this plan should limit their sodium intake to 1,500-2,000 mg (milligrams) of sodium each day. Reading food labels   The Nutrition Facts label lists the amount of sodium in one serving of the food. If you eat more than one serving, you must multiply the listed amount of sodium by the number of servings.  Choose foods with less than 140 mg of sodium per serving.  Avoid foods with 300 mg of sodium or more per serving. Shopping  Look for lower-sodium products, often labeled as "low-sodium" or "no salt added."  Always check the sodium content even if foods are labeled as "unsalted" or "no salt added".  Buy fresh foods. ? Avoid canned foods and premade or frozen meals. ? Avoid canned, cured, or processed meats  Buy breads that have less than 80 mg of sodium per slice. Cooking  Eat more home-cooked food and less restaurant, buffet, and fast food.  Avoid adding salt when cooking. Use salt-free seasonings or herbs instead of table salt or sea salt. Check with your health care provider or pharmacist before using salt substitutes.  Cook with plant-based oils, such as canola, sunflower, or olive oil. Meal planning  When eating at a restaurant, ask that your food be prepared with less salt or no salt, if possible.  Avoid foods that contain MSG (monosodium glutamate). MSG is sometimes added to [place] food, bouillon, and some canned foods. What foods are recommended? The items listed may not  be a complete list. Talk with your dietitian about what dietary choices are best for you. Grains Low-sodium cereals, including oats, puffed wheat and rice, and shredded wheat. Low-sodium crackers. Unsalted rice. Unsalted pasta. Low-sodium bread. Whole-grain breads and whole-grain pasta. Vegetables Fresh or frozen vegetables. "No salt added" canned vegetables. "No salt added" tomato sauce and paste. Low-sodium or reduced-sodium tomato and vegetable juice. Fruits Fresh, frozen, or canned fruit. Fruit juice. Meats and other protein foods Fresh or frozen (no salt added) meat, poultry, seafood, and fish. Low-sodium canned tuna and salmon. Unsalted nuts. Dried peas, beans, and lentils without added salt. Unsalted canned beans. Eggs. Unsalted nut butters. Dairy Milk. Soy  milk. Cheese that is naturally low in sodium, such as ricotta cheese, fresh mozzarella, or Swiss cheese Low-sodium or reduced-sodium cheese. Cream cheese. Yogurt. Fats and oils Unsalted butter. Unsalted margarine with no trans fat. Vegetable oils such as canola or olive oils. Seasonings and other foods Fresh and dried herbs and spices. Salt-free seasonings. Low-sodium mustard and ketchup. Sodium-free salad dressing. Sodium-free light mayonnaise. Fresh or refrigerated horseradish. Lemon juice. Vinegar. Homemade, reduced-sodium, or low-sodium soups. Unsalted popcorn and pretzels. Low-salt or salt-free chips. What foods are not recommended? The items listed may not be a complete list. Talk with your dietitian about what dietary choices are best for you. Grains Instant hot cereals. Bread stuffing, pancake, and biscuit mixes. Croutons. Seasoned rice or pasta mixes. Noodle soup cups. Boxed or frozen macaroni and cheese. Regular salted crackers. Self-rising flour. Vegetables Sauerkraut, pickled vegetables, and relishes. Olives. [place] fries. Onion rings. Regular canned vegetables (not low-sodium or reduced-sodium). Regular canned tomato sauce  and paste (not low-sodium or reduced-sodium). Regular tomato and vegetable juice (not low-sodium or reduced-sodium). Frozen vegetables in sauces. Meats and other protein foods Meat or fish that is salted, canned, smoked, spiced, or pickled. Bacon, ham, sausage, hotdogs, corned beef, chipped beef, packaged lunch meats, salt pork, jerky, pickled herring, anchovies, regular canned tuna, sardines, salted nuts. Dairy Processed cheese and cheese spreads. Cheese curds. Blue cheese. Feta cheese. String cheese. Regular cottage cheese. Buttermilk. Canned milk. Fats and oils Salted butter. Regular margarine. Ghee. Bacon fat. Seasonings and other foods Onion salt, garlic salt, seasoned salt, table salt, and sea salt. Canned and packaged gravies. Worcestershire sauce. Tartar sauce. Barbecue sauce. Teriyaki sauce. Soy sauce, including reduced-sodium. Steak sauce. Fish sauce. Oyster sauce. Cocktail sauce. Horseradish that you find on the shelf. Regular ketchup and mustard. Meat flavorings and tenderizers. Bouillon cubes. Hot sauce and Tabasco sauce. Premade or packaged marinades. Premade or packaged taco seasonings. Relishes. Regular salad dressings. Salsa. Potato and tortilla chips. Corn chips and puffs. Salted popcorn and pretzels. Canned or dried soups. Pizza. Frozen entrees and pot pies. Summary  Eating less sodium can help lower your blood pressure, reduce swelling, and protect your heart, liver, and kidneys.  Most people on this plan should limit their sodium intake to 1,500-2,000 mg (milligrams) of sodium each day.  Canned, boxed, and frozen foods are high in sodium. Restaurant foods, fast foods, and pizza are also very high in sodium. You also get sodium by adding salt to food.  Try to cook at home, eat more fresh fruits and vegetables, and eat less fast food, canned, processed, or prepared foods. This information is not intended to replace advice given to you by your health care provider. Make sure you  discuss any questions you have with your health care provider. Document Released: 09/21/2001 Document Revised: 03/14/2017 Document Reviewed: 03/25/2016 Elsevier Patient Education  2020 [employer].

## 2018-10-09 NOTE — Progress Notes (Signed)
Cardiology Clinic Note   Patient Name: Fidel Caggiano Date of Encounter: 10/09/2018  Primary Care Provider:  Selena Lesser, MD Primary Cardiologist:  Sanda Klein, MD  Patient Profile    Mr. arthor gorter 73 year old male seen today for follow-up of his stress-induced cardiomyopathy.  Past Medical History    Past Medical History:  Diagnosis Date  . COPD (chronic obstructive pulmonary disease) ([place])   . Depression   . Dilated cardiomyopathy ([place])   . Hypertension   . PTSD (post-traumatic stress disorder)   . Sleep apnea    Past Surgical History:  Procedure Laterality Date  . HAMMER TOE SURGERY  April 2017    Allergies  No Known Allergies  History of Present Illness    Mr. devera was recently admitted in [facility].  He was last seen by Dr. Ellyn Hack on 09/07/2018.  During that time he was being evaluated for new LV systolic dysfunction, an abnormal EKG, and elevated troponins in the presence of small subsegmental PE and intracranial bleed.  He was extubated and reintubated 09/01/2018 and was developing worsening renal function at that time.  His LVEF was severely reduced to 25% and findings were consistent with Takotsubo cardiomyopathy, grade 2 diastolic dysfunction with elevated LVEDP and diffuse hypokinesis.  His follow-up echocardiogram showed normal systolic function with an ejection fraction of 60 to 18%, grade 1 diastolic dysfunction.  When compared with prior 08/31/2018 study ventricle wall motion and overall systolic function had normalized.  He has a PMH of essential hypertension, pulmonary embolism, acute combined systolic and diastolic heart failure, COPD, OSA, intracranial hemorrhage, dementia, acute renal failure with acute cortical necrosis, generalized anxiety disorder, constipation, and chronic cough.  Today he presents to the clinic and states he feels well however, he has fallen twice this morning as they were dropping her dog off at the kennel.  His  wife states that she has been taking care of him since his discharge from the hospital and that he has been more irritable.  She contacted his psychiatrist and carbamazepine 200 mg was started 10/08/2018.  He states he is happy to be back home and feels like his strength is slowly starting to increase.  He will have home physical therapy starting next week.  He denies shortness of breath, chest pain, palpitations, lower extremity edema, melena, hematuria, hemoptysis, dizziness, headaches, orthopnea and PND.  Home Medications    Prior to Admission medications   Medication Sig Start Date End Date Taking? Authorizing Provider  acetaminophen (TYLENOL) 325 MG tablet Take 2 tablets (650 mg total) by mouth every 6 (six) hours as needed for mild pain. 09/29/18   Angiulli, Lavon Paganini, PA-C  albuterol (VENTOLIN HFA) 108 (90 Base) MCG/ACT inhaler Inhale 2 puffs into the lungs every 4 (four) hours as needed for wheezing or shortness of breath (((PLAN B))). 09/29/18   Angiulli, Lavon Paganini, PA-C  aspirin EC 81 MG tablet Take 81 mg by mouth daily.    [provider]  esomeprazole (NEXIUM) 40 MG capsule Take 1 capsule (40 mg total) by mouth daily before breakfast. 09/29/18   Angiulli, Lavon Paganini, PA-C  Fluticasone-Umeclidin-Vilant (TRELEGY ELLIPTA) 100-62.5-25 MCG/INH AEPB Inhale 1 puff into the lungs daily. 09/29/18   Angiulli, Lavon Paganini, PA-C  furosemide (LASIX) 20 MG tablet Take 1 tablet (20 mg total) by mouth daily. 09/29/18   Angiulli, Lavon Paganini, PA-C  isosorbide-hydrALAZINE (BIDIL) 20-37.5 MG tablet Take 1 tablet by mouth 3 (three) times daily. 09/29/18   [place], Lavon Paganini,  PA-C  Multiple Vitamin (MULTIVITAMIN WITH MINERALS) TABS tablet Take 1 tablet by mouth daily. 09/22/18   Bonnell Public, MD  pravastatin (PRAVACHOL) 10 MG tablet Take 1 tablet (10 mg total) by mouth daily. 09/29/18   Angiulli, Lavon Paganini, PA-C  QUEtiapine (SEROQUEL) 50 MG tablet Take 1 tablet (50 mg total) by mouth at bedtime. 09/29/18    Angiulli, Lavon Paganini, PA-C    Family History    Family History  Problem Relation Age of Onset  . Asthma Maternal Grandmother   . Heart disease Father   . Dementia Mother    He indicated that his mother is deceased. He indicated that his father is alive. He indicated that his maternal grandmother is deceased. He indicated that his maternal grandfather is deceased. He indicated that his paternal grandmother is deceased. He indicated that his paternal grandfather is deceased.  Social History    Social History   Socioeconomic History  . Marital status: Married    Spouse name: Not on file  . Number of children: 2  . Years of education: 52  . Highest education level: Not on file  Occupational History  . Occupation: Retired Solicitor  . Financial resource strain: Not on file  . Food insecurity    Worry: Not on file    Inability: Not on file  . Transportation needs    Medical: Not on file    Non-medical: Not on file  Tobacco Use  . Smoking status: Former Smoker    Packs/day: 1.00    Years: 30.00    Pack years: 30.00    Types: Cigarettes    Quit date: 04/16/1987    Years since quitting: 31.5  . Smokeless tobacco: Never Used  Substance and Sexual Activity  . Alcohol use: Yes    Alcohol/week: 2.0 standard drinks    Types: 2 Glasses of wine per week  . Drug use: No  . Sexual activity: Not on file  Lifestyle  . Physical activity    Days per week: Not on file    Minutes per session: Not on file  . Stress: Not on file  Relationships  . Social Herbalist on phone: Not on file    Gets together: Not on file    Attends religious service: Not on file    Active member of club or organization: Not on file    Attends meetings of clubs or organizations: Not on file    Relationship status: Not on file  . Intimate partner violence    Fear of current or ex partner: Not on file    Emotionally abused: Not on file    Physically abused: Not on file    Forced  sexual activity: Not on file  Other Topics Concern  . Not on file  Social History Narrative   Fun: Live   Denies religious beliefs effecting health care.   Feels safe at home and denies abuse.      Review of Systems    General:  No chills, fever, night sweats or weight changes.  Cardiovascular:  No chest pain, dyspnea on exertion, edema, orthopnea, palpitations, paroxysmal nocturnal dyspnea. Dermatological: No rash, lesions/masses Respiratory: No cough, dyspnea Urologic: No hematuria, dysuria Abdominal:   No nausea, vomiting, diarrhea, bright red blood per rectum, melena, or hematemesis Neurologic:  No visual changes, wkns, changes in mental status. All other systems reviewed and are otherwise negative except as noted above.  Physical Exam  VS:  BP (!) 164/72   Pulse 73   Temp (!) 97.5 F (36.4 C)   Ht 6' 1.5" (1.867 m)   Wt 156 lb 6.4 oz (70.9 kg)   SpO2 96%   BMI 20.35 kg/m  , BMI Body mass index is 20.35 kg/m. GEN: Well nourished, well developed, in no acute distress. HEENT: normal. Neck: Supple, no JVD, carotid bruits, or masses. Cardiac: RRR, no murmurs, rubs, or gallops. No clubbing, cyanosis, edema.  Radials/DP/PT 2+ and equal bilaterally.  Respiratory:  Respirations regular and unlabored, clear to auscultation bilaterally. GI: Soft, nontender, nondistended, BS + x 4. MS: no deformity or atrophy. Skin: warm and dry, no rash. Neuro:  Strength and sensation are intact. Psych: Normal affect.  Accessory Clinical Findings    ECG personally reviewed by me today-normal sinus rhythm 66 bpm- No acute changes   EKG 09/13/2018: Sinus tach, 102 bpm  Echocardiogram 09/15/2018:  1. The left ventricle has normal systolic function, with an ejection fraction of 60-65%. The cavity size was normal. Left ventricular diastolic Doppler parameters are consistent with impaired relaxation. No evidence of left ventricular regional wall  motion abnormalities.  2. The right ventricle  has normal systolc function. The cavity was normal.  3. Small pericardial effusion.  4. The pericardial effusion is circumferential.  5. The aortic root and ascending aorta are normal in size and structure.  6. When compared to the prior study: Compared to 08/31/2018, there is complete normalization of left ventricular wall motion and overall systolic function. The pericardial effusion is unchanged.  Echocardiogram 08/31/2018: LVEF 20-25%, mild LVH, severe apical hypokinesis with relatively preserved base -may suggest Taksubo cardiomyopathy or multivessel coronary disease, grade 2 DD with elevated LV filling pressure, moderate RV systolic dysfunction, normal biatrial size, possibly bicuspid aortic valve, MAC with trivial MR, trivial TR, RVSP 38 mmHg, dilated IVC  Myocardial perfusion study 04/29/2016: EKG normal sinus rhythm, normal resting conduction with no arrhythmia.  Stress EKG nondiagnostic for ischemia.  Perfusion reveals soft tissue attenuation artifact in the anterior wall.  LV mildly dilated with both rest and stress.  LV ED volume 136 mL.  Low risk study. Ejection fraction 55%  Assessment & Plan   1.  Takotsubo cardiomyopathy-echocardiogram 60 to 65% Continue isosorbide dinitrate-hydralazine 20-37.5 mg tablet 3 times daily Continue furosemide 20 mg tablet daily Continue 81 mg aspirin daily Increase physical activity as tolerated Maintain low-sodium diet Practice stress reduction activities Educated about cardiac recovery after cardiomyopathy  2.  Essential hypertension- monitoring patient instructed to keep follow-up. Continue isosorbide dinitrate-hydralazine 20-37.5 mg tablet 3 times daily Continue furosemide 20 mg tablet daily Increase physical activity as tolerated Physical therapy and home  3.  Hyperlipidemia-08/31/2018: Cholesterol 159; HDL 66; LDL Cholesterol 83; VLDL 10 09/06/2018: Triglycerides 76 Continue pravastatin 10 mg daily Increase fiber in diet Increase  physical activity as tolerated  Disposition: Follow-up with Dr. Sallyanne Kuster in 3 months  Deberah Pelton, NP 10/09/2018, 10:50 AM

## 2018-10-09 NOTE — Assessment & Plan Note (Addendum)
Recent severe exacerbation with hospitalization requiring intubation and mechanical ventilatory support.  Patient has improved. Patient will continue on his aggressive regimen. Desaturation with ambulation.  Check Jeremiah [name] on room air. Advance activity as tolerated.  PT at home  Plan  Patient Instructions  Continue on TRELEGY  daily . Brush/rinse after use.  Continue on Mucinex DM Twice daily  -use with flutter valve .  Set up for an overnight oximetry test on room air .  Continue on PT at home .  Activity as tolerated.  Follow up with Dr. Chase Caller or Jeremiah [name] in 6-8 weeks and As needed   Please contact office for sooner follow up if symptoms do not improve or worsen or seek emergency care

## 2018-10-10 NOTE — Progress Notes (Signed)
Thanks, Jesse 

## 2018-10-13 ENCOUNTER — Encounter: Payer: Medicare Other | Attending: Registered Nurse | Admitting: Registered Nurse

## 2018-10-13 ENCOUNTER — Encounter: Payer: Self-pay | Admitting: Registered Nurse

## 2018-10-13 ENCOUNTER — Other Ambulatory Visit: Payer: Self-pay

## 2018-10-13 VITALS — BP 134/76 | HR 58 | Temp 97.7°F | Resp 14 | Ht 73.5 in | Wt 158.0 lb

## 2018-10-13 DIAGNOSIS — I10 Essential (primary) hypertension: Secondary | ICD-10-CM | POA: Insufficient documentation

## 2018-10-13 DIAGNOSIS — I612 Nontraumatic intracerebral hemorrhage in hemisphere, unspecified: Secondary | ICD-10-CM

## 2018-10-13 DIAGNOSIS — I611 Nontraumatic intracerebral hemorrhage in hemisphere, cortical: Secondary | ICD-10-CM

## 2018-10-13 DIAGNOSIS — I5041 Acute combined systolic (congestive) and diastolic (congestive) heart failure: Secondary | ICD-10-CM | POA: Diagnosis not present

## 2018-10-13 NOTE — Progress Notes (Signed)
Subjective:    Patient ID: Jeremiah [name], male    DOB: 10-Sep-1945, 73 y.o.   MRN: [id number]  HPI: Jeremiah [name] is a 73 y.o. male who is here for transitional care visit in  follow up of his intracerebral hemorrhage, small left lower lobe pulmonary embolism, acute diastolic congestive heart failure and hypertension.   [name] presented to [facility] on 08/14/2018 with shortness of breath.  DG: Chest:  IMPRESSION: Hardware in expected position.  COPD without acute superimposed finding. CT Head WO Contrast:  : 1. 5 mm acute subcortical hemorrhage in the left frontal lobe. Extensive chronic white matter disease, question amyloid angiopathy given this location. 2. Attention to a minimally high-dense left parietal sulcus on follow-up.  CT Angio Chest  IMPRESSION: 1. Single subsegmental pulmonary embolism to the left lower lobe. 2. Coronary atherosclerosis. 3. Emphysema.  MR Brain WO Contrast:  IMPRESSION: MRI head:  1. 5 mm focus of acute hemorrhage in the left frontal lobe is associated with a small subcortical acute/early subacute infarction. Additional punctate foci of acute/early subacute infarction are present in the left anterior frontal lobe and the left temporal lobe. 2. Numerous foci of chronic hemorrhage throughout the brain in a predominant peripheral distribution as well as superficial siderosis over the left-greater-than-right cerebral convexities. The peripheral distribution favors amyloid angiopathy over chronic sequelae of hypertension. 3. Severe chronic microvascular ischemic changes and mild to moderate volume loss of the brain.  MRA head:  Patent anterior and posterior intracranial circulation. No large vessel occlusion, aneurysm, or significant stenosis is identified.  [name] was intubated on 08/31/2018 and extubated on 09/09/2018.  [name] was admitted to inpatient rehabilitation on 09/21/2018 and dischazrged home on 09/30/2018. He is  receiving outpatient therapy with [facility]. He denies pain, he rated his pain 0. Also reports appetite is improving.   Wife in room all questions answered.    Pain Inventory Average Pain 0 Pain Right Now 0 My pain is n/a  In the last 24 hours, has pain interfered with the following? General activity 0 Relation with others 0 Enjoyment of life 0 What TIME of day is your pain at its worst? n/a Sleep (in general) Good  Pain is worse with: n/a Pain improves with: tylenol PRN Relief from Meds: n/a  Mobility walk without assistance ability to climb steps?  no do you drive?  no  Function not employed: date last employed 06/2003 I need assistance with the following:  feeding, dressing, bathing, toileting, household duties and shopping  Neuro/Psych bladder control problems weakness tremor trouble walking confusion depression anxiety  Prior Studies not sure  Physicians involved in your care n/a   Family History  Problem Relation Age of Onset  . Asthma Maternal Grandmother   . Heart disease Father   . Dementia Mother    Social History   Socioeconomic History  . Marital status: Married    Spouse name: Not on file  . Number of children: 2  . Years of education: 50  . Highest education level: Not on file  Occupational History  . Occupation: Retired Solicitor  . Financial resource strain: Not on file  . Food insecurity    Worry: Not on file    Inability: Not on file  . Transportation needs    Medical: Not on file    Non-medical: Not on file  Tobacco Use  . Smoking status: Former Smoker    Packs/day: 1.00    Years: 30.00  Pack years: 30.00    Types: Cigarettes    Quit date: 04/16/1987    Years since quitting: 31.5  . Smokeless tobacco: Never Used  Substance and Sexual Activity  . Alcohol use: Yes    Alcohol/week: 2.0 standard drinks    Types: 2 Glasses of wine per week  . Drug use: No  . Sexual activity: Not on file   Lifestyle  . Physical activity    Days per week: Not on file    Minutes per session: Not on file  . Stress: Not on file  Relationships  . Social Herbalist on phone: Not on file    Gets together: Not on file    Attends religious service: Not on file    Active member of club or organization: Not on file    Attends meetings of clubs or organizations: Not on file    Relationship status: Not on file  Other Topics Concern  . Not on file  Social History Narrative   Fun: Live   Denies religious beliefs effecting health care.   Feels safe at home and denies abuse.    Past Surgical History:  Procedure Laterality Date  . HAMMER TOE SURGERY  April 2017   Past Medical History:  Diagnosis Date  . COPD (chronic obstructive pulmonary disease) ([place])   . Depression   . Dilated cardiomyopathy ([place])   . Hypertension   . PTSD (post-traumatic stress disorder)   . Sleep apnea    There were no vitals taken for this visit.  Opioid Risk Score:   Fall Risk Score:  `1  Depression screen PHQ 2/9  Depression screen [facility] 2/9 04/18/2016 12/08/2015 12/28/2014  Decreased Interest - 0 0  Down, Depressed, Hopeless 0 0 0  PHQ - 2 Score 0 0 0  Some recent data might be hidden      Review of Systems  Constitutional: Positive for appetite change.  All other systems reviewed and are negative.      Objective:   Physical Exam Vitals signs and nursing note reviewed.  Constitutional:      Appearance: Normal appearance.  Neck:     Musculoskeletal: Normal range of motion and neck supple.  Cardiovascular:     Rate and Rhythm: Regular rhythm.     Pulses: Normal pulses.     Heart sounds: Normal heart sounds.  Pulmonary:     Effort: Pulmonary effort is normal.     Breath sounds: Normal breath sounds.  Musculoskeletal:     Comments: Normal Muscle Bulk and Muscle Testing Reveals:  Upper Extremities: Full ROM and Muscle Strength 5/5 Lower Extremities: Full ROM and Muscle Strength 5/5 Arises  from table with ease Narrow Based Gait   Skin:    General: Skin is warm and dry.  Neurological:     Mental Status: He is alert and oriented to person, place, and time.  Psychiatric:        Mood and Affect: Mood normal.        Behavior: Behavior normal.           Assessment & Plan:  1. Intracerebral hemorrhage: Continue outpatient therapy: Has a HFU appointment with neurology.  2. Small Left Lower Pulmonary Embolism: Continue low dose aspirin. Pulmonary Following.  3. Acute Diastolic CHF: Cardiology Following.  4. Essential Hypertension: Continue current medication regimen. PCP Following.   20  minutes of face to face patient care time was spent during this visit. All questions were encouraged and answered.  F/U with Dr. Naaman Plummer

## 2018-10-13 NOTE — Patient Instructions (Addendum)
Please Call : For hospital Follow Up Appointment  [place] [facility]: Call [facility] Follow Up  [address], [place], [place] [address] [phone]

## 2018-11-03 ENCOUNTER — Encounter: Payer: Medicare Other | Admitting: Thoracic Surgery (Cardiothoracic Vascular Surgery)

## 2018-11-18 ENCOUNTER — Other Ambulatory Visit: Payer: Self-pay

## 2018-11-18 ENCOUNTER — Ambulatory Visit (INDEPENDENT_AMBULATORY_CARE_PROVIDER_SITE_OTHER): Payer: Medicare Other | Admitting: Adult Health

## 2018-11-18 ENCOUNTER — Encounter: Payer: Self-pay | Admitting: Adult Health

## 2018-11-18 VITALS — BP 148/86 | HR 62 | Temp 98.6°F | Ht 73.5 in

## 2018-11-18 DIAGNOSIS — G4733 Obstructive sleep apnea (adult) (pediatric): Secondary | ICD-10-CM | POA: Diagnosis not present

## 2018-11-18 DIAGNOSIS — E785 Hyperlipidemia, unspecified: Secondary | ICD-10-CM

## 2018-11-18 DIAGNOSIS — I680 Cerebral amyloid angiopathy: Secondary | ICD-10-CM

## 2018-11-18 DIAGNOSIS — I10 Essential (primary) hypertension: Secondary | ICD-10-CM

## 2018-11-18 DIAGNOSIS — I610 Nontraumatic intracerebral hemorrhage in hemisphere, subcortical: Secondary | ICD-10-CM | POA: Diagnosis not present

## 2018-11-18 DIAGNOSIS — G3184 Mild cognitive impairment, so stated: Secondary | ICD-10-CM

## 2018-11-18 NOTE — Patient Instructions (Signed)
Continue aspirin 81 mg daily  and pravastatin 10 mg for secondary stroke prevention  Continue to follow up with PCP regarding cholesterol and blood pressure management   Ongoing participation with physical therapy and recommend initiating speech therapy for cognitive concerns  Continue to monitor blood pressure at home  Maintain strict control of hypertension with blood pressure goal below 130/90, diabetes with hemoglobin A1c goal below 6.5% and cholesterol with LDL cholesterol (bad cholesterol) goal below 70 mg/dL. I also advised the patient to eat a healthy diet with plenty of whole grains, cereals, fruits and vegetables, exercise regularly and maintain ideal body weight.  Followup in the future with me in 4 months or call earlier if needed       Thank you for coming to see [place] at [facility]. I hope we have been able to provide you high quality care today.  You may receive a patient satisfaction survey over the next few weeks. We would appreciate your feedback and comments so that we may continue to improve ourselves and the health of our patients.

## 2018-11-18 NOTE — Progress Notes (Addendum)
[facility] [address] [place]. [place] [address] [phone]       HOSPITAL FOLLOW UP NOTE  [name] Date of Birth:  1945-06-16 Medical Record Number:  [id number]   Reason for Referral:  hospital stroke follow up    CHIEF COMPLAINT:  Chief Complaint  Patient presents with   Follow-up    Treatment room, with wife. Hospital f/u-ICH    HPI: Jeremiah [name] being seen today for in office hospital follow-up regarding CAA with punctate infarcts and micro bleeds as well as left hemisphere hemosiderosis on 08/31/2018.  History obtained from patient, wife and chart review. Reviewed all radiology images and labs personally.  [name] is a 73 y.o. male with history of dementia, COPD, HTN, OSA admitted for SOB.  MRI showed left frontal punctate infarcts and tiny ICH with hemosiderosis consistent with CAA.  Vessel imaging unremarkable.  Lower extremity venous Dopplers negative for DVT.  2D echo EF of 20-25%.  LDL 83 and A1c 5.6.  Fluctuation of blood pressures during admission with underlying history of HTN.  Recommended continuation of pravastatin 10 mg daily for HLD management.  CT chest showed small LLL segmental PE but unable to initiate AC due to CAA.  Consider initiating aspirin 81 mg if repeat CT head stable.  EEG performed which showed encephalopathic state but no seizure activity.  He was discharged to CIR for ongoing deficits and then discharged home in stable condition with recommendation of 24/7 supervision and home health PT/OT/SLP and nursing.  He has been stable from a neurological standpoint since discharge.  Wife is unsure of mental status worsening as it will wax/wane daily.  He was recently evaluated prior to hospitalization for concerns of memory loss but fortunately did not have follow-up with provider due to being hospitalized.  Denies behavioral concerns.  He continues to work with physical therapy due to his gait and overall strengthening.   He is able to ambulate with rolling walker at home but currently in wheelchair due to distance.  Wife plans on working with [facility] in order to obtain additional speech therapy.  He continues on aspirin 81 mg without bleeding or bruising.  Continues on pravastatin without myalgias.  Blood pressure today 148/86 which is slightly elevated as home readings typically SBP 110-120s.  No further concerns at this time.  Denies new or worsening stroke/TIA symptoms.    ROS:   14 system review of systems performed and negative with exception of memory loss, gait difficulty and weakness  PMH:  Past Medical History:  Diagnosis Date   COPD (chronic obstructive pulmonary disease) (HCC)    Depression    Dilated cardiomyopathy (HCC)    Hypertension    PTSD (post-traumatic stress disorder)    Sleep apnea     PSH:  Past Surgical History:  Procedure Laterality Date   HAMMER TOE SURGERY  April 2017    Social History:  Social History   Socioeconomic History   Marital status: Married    Spouse name: Not on file   Number of children: 2   Years of education: 16   Highest education level: Not on file  Occupational History   Occupation: Retired Civil Service fast [employer] strain: Not on file   Food insecurity    Worry: Not on file    Inability: Not on file   Transportation needs    Medical: Not on file    Non-medical: Not on file  Tobacco Use  Smoking status: Former Smoker    Packs/day: 1.00    Years: 30.00    Pack years: 30.00    Types: Cigarettes    Quit date: 04/16/1987    Years since quitting: 31.6   Smokeless tobacco: Never Used  Substance and Sexual Activity   Alcohol use: Yes    Alcohol/week: 2.0 standard drinks    Types: 2 Glasses of wine per week   Drug use: No   Sexual activity: Not on file  Lifestyle   Physical activity    Days per week: Not on file    Minutes per session: Not on file   Stress: Not on file  Relationships   Social  connections    Talks on phone: Not on file    Gets together: Not on file    Attends religious service: Not on file    Active member of club or organization: Not on file    Attends meetings of clubs or organizations: Not on file    Relationship status: Not on file   Intimate partner violence    Fear of current or ex partner: Not on file    Emotionally abused: Not on file    Physically abused: Not on file    Forced sexual activity: Not on file  Other Topics Concern   Not on file  Social History Narrative   Fun: Live   Denies religious beliefs effecting health care.   Feels safe at home and denies abuse.     Family History:  Family History  Problem Relation Age of Onset   Asthma Maternal Grandmother    Heart disease Father    Dementia Mother     Medications:   Current Outpatient Medications on File Prior to Visit  Medication Sig Dispense Refill   acetaminophen (TYLENOL) 325 MG tablet Take 2 tablets (650 mg total) by mouth every 6 (six) hours as needed for mild pain.     albuterol (VENTOLIN HFA) 108 (90 Base) MCG/ACT inhaler Inhale 2 puffs into the lungs every 4 (four) hours as needed for wheezing or shortness of breath (((PLAN B))). 1 Inhaler 5   aspirin EC 81 MG tablet Take 81 mg by mouth daily.     carbamazepine (CARBATROL) 200 MG 12 hr capsule Take by mouth.     esomeprazole (NEXIUM) 40 MG capsule Take 1 capsule (40 mg total) by mouth daily before breakfast. 30 capsule 2   finasteride (PROSCAR) 5 MG tablet Take by mouth.     Fluticasone-Umeclidin-Vilant (TRELEGY ELLIPTA) 100-62.5-25 MCG/INH AEPB Inhale 1 puff into the lungs daily. 1 each 1   furosemide (LASIX) 20 MG tablet Take 1 tablet (20 mg total) by mouth daily. 30 tablet 0   isosorbide-hydrALAZINE (BIDIL) 20-37.5 MG tablet Take 1 tablet by mouth 3 (three) times daily. 90 tablet 0   Multiple Vitamin (MULTIVITAMIN WITH MINERALS) TABS tablet Take 1 tablet by mouth daily. 30 tablet 0   oxybutynin (DITROPAN) 5  MG tablet Take 5 mg by mouth 3 (three) times daily.     pravastatin (PRAVACHOL) 10 MG tablet Take 1 tablet (10 mg total) by mouth daily. 30 tablet 0   QUEtiapine (SEROQUEL) 50 MG tablet Take 1 tablet (50 mg total) by mouth at bedtime. 30 tablet 0   No current facility-administered medications on file prior to visit.     Allergies:  No Known Allergies   Physical Exam  Vitals:   11/18/18 1313  BP: (!) 148/86  Pulse: 62  Temp: 98.6 F (37  C)  Height: 6' 1.5" (1.867 m)   Body mass index is 20.56 kg/m. No exam data present  MMSE - Mini Mental State Exam 11/18/2018 12/28/2014  Orientation to time 5 5  Orientation to Place 5 5  Registration 3 3  Attention/ Calculation 3 5  Recall 1 3  Language- name 2 objects 2 2  Language- repeat 1 1  Language- follow 3 step command 3 3  Language- read & follow direction 1 1  Write a sentence 1 1  Copy design 1 1  Copy design-comments named 7 animals -  Total score 26 30    General: well developed, well nourished, pleasant elderly African-American male, seated, in no evident distress Head: head normocephalic and atraumatic.   Neck: supple with no carotid or supraclavicular bruits Cardiovascular: regular rate and rhythm, no murmurs Musculoskeletal: no deformity Skin:  no rash/petichiae Vascular:  Normal pulses all extremities   Neurologic Exam Mental Status: Awake and fully alert. Oriented to time and city (unable to name office). Recent and remote memory mildly diminished. Attention span, concentration and fund of knowledge during visit. Mood and affect appropriate.  MMSE 26/30 Cranial Nerves: Fundoscopic exam reveals sharp disc margins. Pupils equal, briskly reactive to light. Extraocular movements full without nystagmus. Visual fields full to confrontation. Hearing intact. Facial sensation intact. Face, tongue, palate moves normally and symmetrically.  Motor: Normal bulk and tone. Normal strength in all tested extremity  muscles. Sensory.: intact to touch , pinprick , position and vibratory sensation.  Coordination: Rapid alternating movements normal in all extremities. Finger-to-nose and heel-to-shin performed accurately bilaterally.  Mild action tremor noted. Gait and Station: Gait assessment deferred as assistive device not present Reflexes: 1+ and symmetric. Toes downgoing.     NIHSS  0 Modified Rankin  2-3    Diagnostic Data (Labs, Imaging, Testing)  Ct Head Wo Contrast 09/02/2018 IMPRESSION:  1. Unchanged small hemorrhage in the posterior left frontal lobe.  2. Unchanged focal density in a left parietal sulcus which may be vascular or indicative of trace subarachnoid hemorrhage.  3. No new intracranial hemorrhage.  4. Severe chronic small vessel ischemic disease.   Ct Head Wo Contrast 08/31/2018  IMPRESSION:  1. 5 mm acute subcortical hemorrhage in the left frontal lobe. Extensive chronic white matter disease, question amyloid angiopathy given this location.  2. Attention to a minimally high-dense left parietal sulcus on follow-up.   Ct Angio Chest Pe W And/or Wo Contrast 08/31/2018 IMPRESSION:  1. Single subsegmental pulmonary embolism to the left lower lobe.  2. Coronary atherosclerosis.  3. Emphysema.    Mr Jeremiah [name] Head Wo Contrast 09/02/2018 IMPRESSION:   MRI head:  1. 5 mm focus of acute hemorrhage in the left frontal lobe is associated with a small subcortical acute/early subacute infarction. Additional punctate foci of acute/early subacute infarction are present in the left anterior frontal lobe and the left temporal lobe.  2. Numerous foci of chronic hemorrhage throughout the brain in a predominant peripheral distribution as well as superficial siderosis over the left-greater-than-right cerebral convexities. The peripheral distribution favors amyloid angiopathy over chronic sequelae of hypertension.  3. Severe chronic microvascular ischemic changes and mild to moderate volume  loss of the brain.   MRA head:  Patent anterior and posterior intracranial circulation. No large vessel occlusion, aneurysm, or significant stenosis is identified.   Vas [place] Lower Extremity Venous (dvt) 08/31/2018 Summary:  Right: There is no evidence of deep vein thrombosis in the lower extremity. No cystic structure found in the  popliteal fossa.  Left: There is no evidence of deep vein thrombosis in the lower extremity. No cystic structure found in the popliteal fossa.   EEG 09/03/2018 This isa technically difficultelectroencephalogramdue to the presence of muscle and movement artifact. The portion able to be evaluated was abnormal due to general background slowing andthe presence of triphasic waves. These are seen most commonly in encephalopathic states. An influence of medication effect is likely present as well.   ASSESSMENT: Jeremiah [name] is a 73 y.o. year old male here with CAA with punctate infarcts and micro-bleeds as well as left hemisphere hemosiderosis on 08/31/2018. Vascular risk factors include HTN, HLD, OSA, MCI, cardiomyopathy.  Residual deficit of cognitive/memory decline and gait difficulty    PLAN:  1. Recent stroke: Continue aspirin 81 mg daily  and atorvastatin for secondary stroke prevention. Maintain strict control of hypertension with blood pressure goal below 130/90, diabetes with hemoglobin A1c goal below 6.5% and cholesterol with LDL cholesterol (bad cholesterol) goal below 70 mg/dL.  I also advised the patient to eat a healthy diet with plenty of whole grains, cereals, fruits and vegetables, exercise regularly with at least 30 minutes of continuous activity daily and maintain ideal body weight. 2. CAA: Educated patient and wife on CAA with importance of managing risk factors and potential of recurring bleeds.  Educational information provided.  We will repeat MMSE at follow-up visit to assess for potential cognitive decline 3. HTN: Advised to continue  current treatment regimen.  Today's BP slightly elevated at visit but normal levels at home.  Advised to continue to monitor at home along with continued follow-up with PCP for management 4. HLD: Advised to continue current treatment regimen along with continued follow-up with PCP for future prescribing and monitoring of lipid panel 5. OSA: Ongoing compliance with CPAP for OSA management 6. Residual deficit: Encouraged continuation of participation with home PT and [facility] 7. Cardiomyopathy/PE: Continue to follow with cardiology for ongoing monitoring and management  Cerebral amyloid  Follow up in 4 months or call earlier if needed   Greater than 50% of time during this 45 minute visit was spent on counseling, explanation of diagnosis of recent stroke and CAA, reviewing risk factor management of CAA, HTN, HLD, OSA and cardiomyopathy/PE, planning of further management along with potential future management, and discussion with patient and family answering all questions.    Venancio Poisson, AGNP-BC  [facility] [address] [place], [place] [address]  Phone [phone] Fax [phone] Note: This document was prepared with digital dictation and possible smart phrase technology. Any transcriptional errors that result from this process are unintentional.

## 2018-11-23 ENCOUNTER — Ambulatory Visit: Payer: Medicare Other | Admitting: Adult Health

## 2018-11-23 NOTE — Progress Notes (Signed)
I agree with the above plan 

## 2018-11-25 ENCOUNTER — Telehealth: Payer: Self-pay | Admitting: Adult Health

## 2018-11-25 ENCOUNTER — Encounter: Payer: Self-pay | Admitting: Adult Health

## 2018-11-25 ENCOUNTER — Ambulatory Visit (INDEPENDENT_AMBULATORY_CARE_PROVIDER_SITE_OTHER): Payer: Medicare Other | Admitting: Adult Health

## 2018-11-25 ENCOUNTER — Other Ambulatory Visit: Payer: Self-pay

## 2018-11-25 DIAGNOSIS — J9611 Chronic respiratory failure with hypoxia: Secondary | ICD-10-CM | POA: Diagnosis not present

## 2018-11-25 DIAGNOSIS — J441 Chronic obstructive pulmonary disease with (acute) exacerbation: Secondary | ICD-10-CM

## 2018-11-25 DIAGNOSIS — J449 Chronic obstructive pulmonary disease, unspecified: Secondary | ICD-10-CM

## 2018-11-25 DIAGNOSIS — I2699 Other pulmonary embolism without acute cor pulmonale: Secondary | ICD-10-CM | POA: Diagnosis not present

## 2018-11-25 MED ORDER — TRELEGY ELLIPTA 100-62.5-25 MCG/INH IN AEPB: 1.0000 | INHALATION_SPRAY | Freq: Every day | RESPIRATORY_TRACT | 5 refills | Status: DC

## 2018-11-25 MED ORDER — ALBUTEROL SULFATE HFA 108 (90 BASE) MCG/ACT IN AERS: 2.0000 | INHALATION_SPRAY | RESPIRATORY_TRACT | 5 refills | Status: AC | PRN

## 2018-11-25 NOTE — Telephone Encounter (Signed)
[facility] is calling stating patient needs a titration study due to his OSA. Per Medicare patient with a dx of OSA and wanting nocturnal O2 needs to have a titration sleep study.   TP please advise

## 2018-11-25 NOTE — Telephone Encounter (Signed)
Order for [employer] has been canceled, and new order has been placed for [facility].  I have made Caryl Pina with [employer] aware of this information.  Nothing further is needed.

## 2018-11-25 NOTE — Patient Instructions (Addendum)
Continue on TRELEGY  daily . Brush/rinse after use.  Continue on Mucinex DM Twice daily  -use with flutter valve .  Activity as tolerated.  Begin Oxygen 2l/m At bedtime.  Follow up with Dr. Chase Caller or [facility] in 2 months and  and As needed   Please contact office for sooner follow up if symptoms do not improve or worsen or seek emergency care

## 2018-11-25 NOTE — Telephone Encounter (Signed)
Order was sent to the wrong company looks like the ONO was done by [facility]

## 2018-11-25 NOTE — Assessment & Plan Note (Signed)
Currently doing very well on [place]  Patient Instructions  Continue on TRELEGY  daily . Brush/rinse after use.  Continue on Mucinex DM Twice daily  -use with flutter valve .  Activity as tolerated.  Begin Oxygen 2l/m At bedtime.  Follow up with Dr. Chase Caller or [facility] in 2 months and  and As needed   Please contact office for sooner follow up if symptoms do not improve or worsen or seek emergency care

## 2018-11-25 NOTE — Assessment & Plan Note (Signed)
Nocturnal hypoxemia with ONO positive desats Will start nocturnal O2 at 2l/m

## 2018-11-25 NOTE — Assessment & Plan Note (Addendum)
CT chest 08/2018 with small LLL PE , not anticoagulation candidate due to Left frontal hemorrhage.  Clinically he is doing well. O2 sats 97% on room air .  No DVT on dopplers. Echo without right sided strain.  Has some renal insufficiency , will not repeat CT chest to verify resolution . Clinically appears this has resolved.  This occurred during critical illness .

## 2018-11-25 NOTE — Addendum Note (Signed)
Addended by: Maryanna Shape A on: 11/25/2018 02:58 PM   Modules accepted: Orders

## 2018-11-25 NOTE — Progress Notes (Signed)
@Patient  ID: Jeremiah [name], male    DOB: June 20, 1945, 73 y.o.   MRN: [id number]  Chief Complaint  Patient presents with  . Follow-up    COPD     Referring provider: Selena Lesser, MD  HPI: 73yo male former smoker followed for GOLD III COPD  TEST/EVENTS :  PFTs 10/12/2015FEV1 1.30 (35%) ratio 49 with severe air trapping and no better p saba and dlco 48% -- referred to rehab 01/24/14-completed -Med calendar 02/21/2014>did not recognize it when reprinted 03/29/14 >reviewed , 08/03/2014, 04/28/2015 CT chest 02/2016 no ILD , emphysema changes  11/25/2018 Follow up : COPD , CM, PE  Patient presents for a 48-month follow-up.  Patient has underlying severe COPD.  He was hospitalized in May with a prolonged critical illness.  He had a COPD exacerbation with acute respiratory failure.  He required intubation and ventilator support.  CT chest showed a small left lower lobe PE.  CT head showed a small 5 mm left frontal lobe hemorrhage.  He was not treated with anticoagulation due to this.  2D echo showed an EF of 20 to 25% suspected a stress-induced cardiomyopathy.  He did have a follow-up with cardiology and repeat echo showed improved EF at 60% and grade 1 diastolic dysfunction.  He did require a discharge to inpatient rehab.  He has now home.  Patient says over the last couple months he is starting to get stronger.  He is starting to feel better. Breathing has been best in while .   He remains on Trelegy daily.  Breathing has been doing okay.  No flare of cough or wheezing  ONO on room air was done last visit on 10/23/18 that showed siginficant desats while sleeping , ~6hr spent <88%, SpO2 low 79%. , Avg Spo2 88%.  Previously on CPAP in past for COPD from [place] , was taken off couple of years ago. Told he does not have sleep apnea. No [facility]  records available   He does have underlying cognitive impairment .   He is accompanied by his wife.    No Known Allergies  Immunization History   Administered Date(s) Administered  . Influenza Split 01/13/2013  . Influenza, High Dose Seasonal PF 12/28/2014, 01/15/2016, 01/13/2017, 01/16/2018  . Influenza,inj,Quad PF,6+ Mos 01/24/2014  . Pneumococcal Conjugate-13 12/08/2017  . Pneumococcal-Unspecified 04/15/2010  . Tdap 12/28/2014    Past Medical History:  Diagnosis Date  . COPD (chronic obstructive pulmonary disease) ([place])   . Depression   . Dilated cardiomyopathy ([place])   . Hypertension   . PTSD (post-traumatic stress disorder)   . Sleep apnea     Tobacco History: Social History   Tobacco Use  Smoking Status Former Smoker  . Packs/day: 1.00  . Years: 30.00  . Pack years: 30.00  . Types: Cigarettes  . Quit date: 04/16/1987  . Years since quitting: 31.6  Smokeless Tobacco Never Used   Counseling given: Not Answered   Outpatient Medications Prior to Visit  Medication Sig Dispense Refill  . acetaminophen (TYLENOL) 325 MG tablet Take 2 tablets (650 mg total) by mouth every 6 (six) hours as needed for mild pain.    [name] Kitchen albuterol (VENTOLIN HFA) 108 (90 Base) MCG/ACT inhaler Inhale 2 puffs into the lungs every 4 (four) hours as needed for wheezing or shortness of breath (((PLAN B))). 1 Inhaler 5  . aspirin EC 81 MG tablet Take 81 mg by mouth daily.    . carbamazepine (CARBATROL) 200 MG 12 hr capsule Take by mouth.    [name] Kitchen  esomeprazole (NEXIUM) 40 MG capsule Take 1 capsule (40 mg total) by mouth daily before breakfast. 30 capsule 2  . finasteride (PROSCAR) 5 MG tablet Take by mouth.    . Fluticasone-Umeclidin-Vilant (TRELEGY ELLIPTA) 100-62.5-25 MCG/INH AEPB Inhale 1 puff into the lungs daily. 1 each 1  . furosemide (LASIX) 20 MG tablet Take 1 tablet (20 mg total) by mouth daily. 30 tablet 0  . isosorbide-hydrALAZINE (BIDIL) 20-37.5 MG tablet Take 1 tablet by mouth 3 (three) times daily. 90 tablet 0  . Multiple Vitamin (MULTIVITAMIN WITH MINERALS) TABS tablet Take 1 tablet by mouth daily. 30 tablet 0  . oxybutynin (DITROPAN) 5  MG tablet Take 5 mg by mouth 3 (three) times daily.    . pravastatin (PRAVACHOL) 10 MG tablet Take 1 tablet (10 mg total) by mouth daily. 30 tablet 0  . QUEtiapine (SEROQUEL) 50 MG tablet Take 1 tablet (50 mg total) by mouth at bedtime. 30 tablet 0   No facility-administered medications prior to visit.      Review of Systems:   Constitutional:   No  weight loss, night sweats,  Fevers, chills,  +fatigue, or  lassitude.  HEENT:   No headaches,  Difficulty swallowing,  Tooth/dental problems, or  Sore throat,                No sneezing, itching, ear ache, nasal congestion, post nasal drip,   CV:  No chest pain,  Orthopnea, PND, swelling in lower extremities, anasarca, dizziness, palpitations, syncope.   GI  No heartburn, indigestion, abdominal pain, nausea, vomiting, diarrhea, change in bowel habits, loss of appetite, bloody stools.   Resp:   No chest wall deformity  Skin: no rash or lesions.  GU: no dysuria, change in color of urine, no urgency or frequency.  No flank pain, no hematuria   MS:  No joint pain or swelling.  No decreased range of motion.  No back pain.    Physical Exam  BP 118/72 (BP Location: Left Arm, Cuff Size: Normal)   Pulse (!) 58   Temp 98.4 F (36.9 C) (Oral)   Ht 6' 1.5" (1.867 m)   Wt 157 lb 12.8 oz (71.6 kg)   SpO2 97%   BMI 20.54 kg/m   GEN: A/Ox3; pleasant , NAD, elderly    HEENT:  [place]/AT,  EACs-clear, TMs-wnl, NOSE-clear, THROAT-clear, no lesions, no postnasal drip or exudate noted.   NECK:  Supple w/ fair ROM; no JVD; normal carotid impulses w/o bruits; no thyromegaly or nodules palpated; no lymphadenopathy.    RESP  Clear  P & A; w/o, wheezes/ rales/ or rhonchi. no accessory muscle use, no dullness to percussion  CARD:  RRR, no m/r/g, no peripheral edema, pulses intact, no cyanosis or clubbing.  GI:   Soft & nt; nml bowel sounds; no organomegaly or masses detected.   Musco: Warm bil, no deformities or joint swelling noted.   Neuro:  alert, no focal deficits noted.    Skin: Warm, no lesions or rashes    Lab Results:  CBC  BNP  No results found.    PFT Results Latest Ref Rng & Units 03/22/2016 01/24/2014  FVC-Pre L 3.33 2.66  FVC-Predicted Pre % 79 54  FVC-Post L 3.53 2.75  FVC-Predicted Post % 84 56  Pre FEV1/FVC % % 55 49  Post FEV1/FCV % % 51 46  FEV1-Pre L 1.82 1.30  FEV1-Predicted Pre % 57 35  FEV1-Post L 1.79 1.26  DLCO UNC% % - 48  DLCO  COR %Predicted % - 60  TLC L - 11.28  TLC % Predicted % - 151  RV % Predicted % - 306    No results found for: NITRICOXIDE      Assessment & Plan:   No problem-specific Assessment & Plan notes found for this encounter.     Rexene Edison, NP 11/25/2018

## 2018-11-26 ENCOUNTER — Telehealth: Payer: Self-pay | Admitting: Adult Health

## 2018-11-26 NOTE — Telephone Encounter (Signed)
Spoke with pt's wife, Jeremiah [name]. She states that they were contacted by [facility] and was told that he would have to repeat his ONO. There are other telephone encounters open on this matter. [employer] that we are currently working on this and take care of it as quickly as we can. She verbalized understanding but was very vocal about how unhappy she was. Nothing further was needed at this time.

## 2018-11-26 NOTE — Telephone Encounter (Signed)
Per 8.12.2020 office note w/ TP: ONO on room air was done last visit on 10/23/18 that showed siginficant desats while sleeping , ~6hr spent <88%, SpO2 low 79%. , Avg Spo2 88%.  Previously on CPAP in past for COPD from [place] , was taken off couple of years ago. Told he does not have sleep apnea. No [facility] available    Per TP patient and spouse both report that patient does NOT have OSA as was reported to them by the [place].  Can [facility] please proceed with the O2 as ordered, thank you.

## 2018-11-26 NOTE — Telephone Encounter (Signed)
Called and spoke with Melissa from [facility]. Per Lenna Sciara, pt is going to need to have another ONO performed due to the original one pt had done being out of date. Melissa said that they are going to go ahead and call pt to get a waiver signed to have the O2 delivered but Melissa did state that pt is going to need to have another ONO performed with the first one being out of date.  She said that she is needing to have the order placed for the ONO to be repeated ASAP. Melissa did state that she sent TP a message earlier today explaining all of this.  I have pended the order for the ONO. Tammy, please advise if you are okay signing off on the order to have ONO repeated ASAP. Thanks!

## 2018-11-26 NOTE — Telephone Encounter (Signed)
Please read my office note

## 2018-11-26 NOTE — Telephone Encounter (Signed)
Melissa from [facility] called back.  Please call her back regarding ONO ASAP.  [phone].

## 2018-12-01 ENCOUNTER — Telehealth: Payer: Self-pay | Admitting: Adult Health

## 2018-12-01 DIAGNOSIS — J9611 Chronic respiratory failure with hypoxia: Secondary | ICD-10-CM

## 2018-12-01 NOTE — Telephone Encounter (Signed)
That is fine to d/c O2 .

## 2018-12-01 NOTE — Telephone Encounter (Signed)
Spoke with patient, he was made aware of the results. He stated that he had never used the O2 once it was delivered to his house and he wishes to have an order sent to [facility] to D/C O2. Advised him I'm sure TP would be ok with this but I would need to check first, he verbalized understanding.   TP, are you ok with me placing an order to D/C his O2? Thanks!

## 2018-12-01 NOTE — Telephone Encounter (Signed)
Received ONO results from [employer]. ONO was performed twice on room air. Per TP, patient does not need O2 at night. She stated that he had an ONO performed last month and it showed that he did need O2. This positive ONO could be caused by an acute illness that the patient had the time of the study and now he is better.   Left message for patient or spouse to call back for results. Will send ONOs to scan.

## 2018-12-01 NOTE — Addendum Note (Signed)
Addended by: Valerie Salts on: 12/01/2018 04:42 PM   Modules accepted: Orders

## 2018-12-02 ENCOUNTER — Telehealth: Payer: Self-pay | Admitting: Adult Health

## 2018-12-02 NOTE — Telephone Encounter (Signed)
[facility] pt spouse, line went to voicemail. LMTCB x1.

## 2018-12-03 NOTE — Telephone Encounter (Signed)
Called spoke with patient's wife. She states she talked with [employer] today and they received confirmation of the order to dc O2 at 0830 12/02/18. And that [employer] was going to pick up O2 sometime today or tomorrow.   She is not happy with [employer] and if any further needs for DME she prefers not to use them in the future.   I let her know I would make note of this and that if any further issues occurred, to please call and let us know or have [employer] call us so we could help.   Nothing further needed at this time.

## 2018-12-16 ENCOUNTER — Encounter: Payer: Self-pay | Admitting: Physical Medicine & Rehabilitation

## 2018-12-16 ENCOUNTER — Other Ambulatory Visit: Payer: Self-pay

## 2018-12-16 ENCOUNTER — Encounter: Payer: Medicare Other | Attending: Registered Nurse | Admitting: Physical Medicine & Rehabilitation

## 2018-12-16 VITALS — BP 163/88 | HR 60 | Temp 97.7°F | Ht 74.0 in | Wt 157.0 lb

## 2018-12-16 DIAGNOSIS — I5041 Acute combined systolic (congestive) and diastolic (congestive) heart failure: Secondary | ICD-10-CM | POA: Insufficient documentation

## 2018-12-16 DIAGNOSIS — I611 Nontraumatic intracerebral hemorrhage in hemisphere, cortical: Secondary | ICD-10-CM | POA: Diagnosis not present

## 2018-12-16 DIAGNOSIS — I10 Essential (primary) hypertension: Secondary | ICD-10-CM | POA: Diagnosis not present

## 2018-12-16 NOTE — Progress Notes (Signed)
Subjective:    Patient ID: Jeremiah [name], male    DOB: 07/25/1945, 73 y.o.   MRN: [id number]  HPI   Jeremiah [name] is here in follow up of his [place]. He has been home since June. Things have been going fairly well. He is walking without a device currently. He is independent with his self-care. Wife notices some memory issues.as well as difficulties with focus. He reports he's having word finding deficits. He notices it more during conversations on the phone or with friends and people outside his closer circles.    He denies pain. Mood is good. Sleep habits are normal. [name] gained back as appetite is better.   Pain Inventory Average Pain 0 Pain Right Now 0 My pain is na  In the last 24 hours, has pain interfered with the following? General activity 0 Relation with others 0 Enjoyment of life 0 What TIME of day is your pain at its worst? na Sleep (in general) Fair  Pain is worse with: na Pain improves with: na Relief from Meds: 0  Mobility walk with assistance do you drive?  no needs help with transfers  Function retired I need assistance with the following:  dressing, bathing, meal prep, household duties and shopping  Neuro/Psych bladder control problems tremor trouble walking confusion depression anxiety  Prior Studies Any changes since last visit?  no  Physicians involved in your care Any changes since last visit?  no   Family History  Problem Relation Age of Onset  . Asthma Maternal Grandmother   . Heart disease Father   . Dementia Mother    Social History   Socioeconomic History  . Marital status: Married    Spouse name: Not on file  . Number of children: 2  . Years of education: 55  . Highest education level: Not on file  Occupational History  . Occupation: Retired Solicitor  . Financial resource strain: Not on file  . Food insecurity    Worry: Not on file    Inability: Not on file  . Transportation needs    Medical: Not on file     Non-medical: Not on file  Tobacco Use  . Smoking status: Former Smoker    Packs/day: 1.00    Years: 30.00    Pack years: 30.00    Types: Cigarettes    Quit date: 04/16/1987    Years since quitting: 31.6  . Smokeless tobacco: Never Used  Substance and Sexual Activity  . Alcohol use: Yes    Alcohol/week: 2.0 standard drinks    Types: 2 Glasses of wine per week  . Drug use: No  . Sexual activity: Not on file  Lifestyle  . Physical activity    Days per week: Not on file    Minutes per session: Not on file  . Stress: Not on file  Relationships  . Social Herbalist on phone: Not on file    Gets together: Not on file    Attends religious service: Not on file    Active member of club or organization: Not on file    Attends meetings of clubs or organizations: Not on file    Relationship status: Not on file  Other Topics Concern  . Not on file  Social History Narrative   Fun: Live   Denies religious beliefs effecting health care.   Feels safe at home and denies abuse.    Past Surgical History:  Procedure Laterality Date  .  HAMMER TOE SURGERY  April 2017   Past Medical History:  Diagnosis Date  . COPD (chronic obstructive pulmonary disease) ([place])   . Depression   . Dilated cardiomyopathy ([place])   . Hypertension   . PTSD (post-traumatic stress disorder)   . Sleep apnea    BP (!) 163/88   Pulse 60   Temp 97.7 F (36.5 C)   Ht 6\' 2"  (1.88 m)   Wt 157 lb (71.2 kg)   SpO2 93%   BMI 20.16 kg/m   Opioid Risk Score:   Fall Risk Score:  `1  Depression screen PHQ 2/9  Depression screen [facility] 2/9 10/13/2018 04/18/2016 12/08/2015 12/28/2014  Decreased Interest 2 - 0 0  Down, Depressed, Hopeless 0 0 0 0  PHQ - 2 Score 2 0 0 0  Altered sleeping 1 - - -  Tired, decreased energy 3 - - -  Change in appetite 3 - - -  Feeling bad or failure about yourself  0 - - -  Trouble concentrating 3 - - -  Moving slowly or fidgety/restless 0 - - -  Suicidal thoughts 0 - - -   PHQ-9 Score 12 - - -  Some recent data might be hidden     Review of Systems  Constitutional: Negative.   HENT: Negative.   Eyes: Negative.   Respiratory: Positive for shortness of breath.   Cardiovascular: Negative.   Gastrointestinal: Negative.   Endocrine: Negative.   Genitourinary: Positive for difficulty urinating.  Musculoskeletal: Positive for myalgias.  Skin: Negative.   Allergic/Immunologic: Negative.   Neurological: Positive for tremors.  Hematological: Negative.   Psychiatric/Behavioral: Positive for confusion and dysphoric mood. The patient is nervous/anxious.   All other systems reviewed and are negative.      Objective:   Physical Exam Gen: no distress, normal appearing HEENT: oral mucosa pink and moist, NCAT Cardio: Reg rate Chest: normal effort, normal rate of breathing Abd: soft, non-distended Ext: no edema Skin: intact Neuro: good balance. Strength nearly 5/5. Does well with normal gait. Tandem gait loses balance. Serial 7's 1/3. spelled "world" forwards and backwards. Had difficulties with numerical sequences. Abstract thinking fair.   Musculoskeletal: normal Psych: pleasant, normal affect        Assessment & Plan:  1. Intracerebral hemorrhage:    -continue HEP.  2. Small Left Lower Pulmonary Embolism: Continue low dose aspirin. Pulmonary Following.  3. Acute Diastolic CHF: Cardiology Following.  4. Essential Hypertension: Continue current medication regimen. PCP Following.     Fifteen minutes of face to face patient care time were spent during this visit. All questions were encouraged and answered.  Follow up with me in 3 .

## 2018-12-16 NOTE — Patient Instructions (Signed)
RETURN TO DRIVING PLAN:  WITH THE SUPERVISION OF A LICENSED DRIVER, PLEASE DRIVE IN AN EMPTY PARKING LOT FOR AT LEAST 2-3 TRIALS TO TEST REACTION TIME, VISION, USE OF EQUIPMENT IN CAR, ETC.  IF SUCCESSFUL WITH THE PARKING LOT DRIVING, PROCEED TO SUPERVISED DRIVING TRIALS IN YOUR NEIGHBORHOOD STREETS AT LOW TRAFFIC TIMES TO TEST OBSERVATION TO TRAFFIC SIGNALS, REACTION TIME, ETC. PLEASE ATTEMPT AT LEAST 2-3 TRIALS IN YOUR NEIGHBORHOOD.  IF NEIGHBORHOOD DRIVING IS SUCCESSFUL, YOU MAY PROCEED TO DRIVING IN BUSIER AREAS IN YOUR COMMUNITY WITH SUPERVISION OF A LICENSED DRIVER. PLEASE ATTEMPT AT LEAST 4-5 TRIALS.  IF COMMUNITY DRIVING IS SUCCESSFUL, YOU MAY PROCEED TO DRIVING ALONE, DURING THE DAY TIME, IN NON-PEAK TRAFFIC TIMES. YOU SHOULD DRIVE NO FURTHER THAN 15 MINUTES IN ONE DIRECTION. PLEASE DO NOT DRIVE IF YOU FEEL FATIGUED OR UNDER THE INFLUENCE OF MEDICATION.   

## 2019-01-06 ENCOUNTER — Ambulatory Visit: Payer: Medicare Other | Admitting: Cardiovascular Disease

## 2019-01-19 ENCOUNTER — Telehealth: Payer: Self-pay

## 2019-01-19 ENCOUNTER — Telehealth (INDEPENDENT_AMBULATORY_CARE_PROVIDER_SITE_OTHER): Payer: Medicare Other | Admitting: General Practice

## 2019-01-19 ENCOUNTER — Telehealth: Payer: Medicare Other | Admitting: Medical

## 2019-01-19 VITALS — BP 128/86 | HR 64 | Ht 74.0 in | Wt 158.0 lb

## 2019-01-19 DIAGNOSIS — R002 Palpitations: Secondary | ICD-10-CM

## 2019-01-19 DIAGNOSIS — I5181 Takotsubo syndrome: Secondary | ICD-10-CM

## 2019-01-19 DIAGNOSIS — E7800 Pure hypercholesterolemia, unspecified: Secondary | ICD-10-CM

## 2019-01-19 DIAGNOSIS — I10 Essential (primary) hypertension: Secondary | ICD-10-CM

## 2019-01-19 NOTE — Telephone Encounter (Signed)
Called patient to discuss AVS instructions gave Coletta Memos, NP recommendations and patient voiced understanding. AVS summary mailed to patient.

## 2019-01-19 NOTE — Telephone Encounter (Signed)

## 2019-01-19 NOTE — Progress Notes (Signed)
Virtual Visit via Video Note   This visit type was conducted due to national recommendations for restrictions regarding the COVID-19 Pandemic (e.g. social distancing) in an effort to limit this patient's exposure and mitigate transmission in our community.  Due to his co-morbid illnesses, this patient is at least at moderate risk for complications without adequate follow up.  This format is felt to be most appropriate for this patient at this time.  All issues noted in this document were discussed and addressed.  A limited physical exam was performed with this format.  Please refer to the patient's chart for his consent to telehealth for [facility].  Evaluation Performed:  Follow-up visit  This visit type was conducted due to national recommendations for restrictions regarding the COVID-19 Pandemic (e.g. social distancing).  This format is felt to be most appropriate for this patient at this time.  All issues noted in this document were discussed and addressed.  No physical exam was performed (except for noted visual exam findings with Video Visits).  Please refer to the patient's chart (MyChart message for video visits and phone note for telephone visits) for the patient's consent to telehealth for [facility]  Date:  01/19/2019   ID:  Jeremiah [name], DOB 12-06-1945, MRN [id number]  Patient Location: [place], [place]   Provider location:   [address] [phone] Fax [phone]   PCP:  Jeremiah Lesser, MD  Cardiologist:  Jeremiah Klein, MD Electrophysiologist:  None   Chief Complaint: Follow-up for stress-induced cardiomyopathy.  History of Present Illness:    Jeremiah [name] is a 73 y.o. male who presents via audio/video conferencing for a telehealth visit today.  Patient verified DOB and address.  [name] was last seen by me on 10/09/2018.  He had been admitted at [facility] in May 2020 for  Takotsubo cardiomyopathy.  His follow-up echocardiogram showed an LVEF of 60-65%, his blood pressure was elevated however, he stated he was irritated due to dropping his dog off of the kennel and falling twice on the morning of his clinic visit.  He was instructed to monitor his blood pressure at home and keep a log for his next appointment.  He stated he felt well at that time and his strength was returning.  He is seen virtually today and states he feels much better and is getting back to his normal daily activities.  However, he notices heart palpitations in the evenings when he lays down to go to sleep.  He states he does not notice his heartbeat during the day only at night when he lays down.  He states when he was younger and before he went to the hospital his heart rate was routinely in the mid 50s and since he has been in the hospital this May his heart rate has stayed in the 30s and 13s.  He denies chest pain, shortness of breath, lower extremity edema, fatigue, melena, hematuria, hemoptysis, diaphoresis, weakness, presyncope, syncope, orthopnea, and PND.  The patient does not symptoms concerning for COVID-19 infection (fever, chills, cough, or new SHORTNESS OF BREATH).    Prior CV studies:   The following studies were reviewed today:  EKG 10/09/2018 normal sinus rhythm 66 bpm- No acute changes   EKG 09/13/2018: Sinus tach, 102 bpm  Echocardiogram 09/15/2018:  1. The left ventricle has normal systolic function, with an ejection fraction of 60-65%. The cavity size was normal. Left ventricular diastolic Doppler parameters  are consistent with impaired relaxation. No evidence of left ventricular regional wall  motion abnormalities. 2. The right ventricle has normal systolc function. The cavity was normal. 3. Small pericardial effusion. 4. The pericardial effusion is circumferential. 5. The aortic root and ascending aorta are normal in size and structure. 6. When compared to the  prior study: Compared to 08/31/2018, there is complete normalization of left ventricular wall motion and overall systolic function. The pericardial effusion is unchanged.  Echocardiogram 08/31/2018: LVEF 20-25%, mild LVH, severe apical hypokinesis with relatively preserved base -may suggest Taksubo cardiomyopathy or multivessel coronary disease, grade 2 DD with elevated LV filling pressure, moderate RV systolic dysfunction, normal biatrial size, possibly bicuspid aortic valve, MAC with trivial MR, trivial TR, RVSP 38 mmHg, dilated IVC  Myocardial perfusion study 04/29/2016: EKG normal sinus rhythm, normal resting conduction with no arrhythmia.  Stress EKG nondiagnostic for ischemia.  Perfusion reveals soft tissue attenuation artifact in the anterior wall.  LV mildly dilated with both rest and stress.  LV ED volume 136 mL.  Low risk study. Ejection fraction 55%  Past Medical History:  Diagnosis Date  . COPD (chronic obstructive pulmonary disease) ([place])   . Depression   . Dilated cardiomyopathy ([place])   . Hypertension   . PTSD (post-traumatic stress disorder)   . Sleep apnea    Past Surgical History:  Procedure Laterality Date  . HAMMER TOE SURGERY  April 2017     No outpatient medications have been marked as taking for the 01/19/19 encounter (Telemedicine) with Deberah Pelton, NP.     Allergies:   Patient has no known allergies.   Social History   Tobacco Use  . Smoking status: Former Smoker    Packs/day: 1.00    Years: 30.00    Pack years: 30.00    Types: Cigarettes    Quit date: 04/16/1987    Years since quitting: 31.7  . Smokeless tobacco: Never Used  Substance Use Topics  . Alcohol use: Yes    Alcohol/week: 2.0 standard drinks    Types: 2 Glasses of wine per week  . Drug use: No     Family Hx: The patient's family history includes Asthma in his maternal grandmother; Dementia in his mother; Heart disease in his father.  ROS:   Please see the history of present  illness.     All other systems reviewed and are negative.   Labs/Other Tests and Data Reviewed:    Recent Labs: 09/12/2018: B Natriuretic Peptide 193.1 09/19/2018: Magnesium 2.1 09/22/2018: ALT 14; BUN 11; Creatinine, Ser 1.23; Hemoglobin 11.7; Platelets 349; Potassium 3.4; Sodium 138   Recent Lipid Panel Lab Results  Component Value Date/Time   CHOL 159 08/31/2018 06:30 PM   TRIG 76 09/06/2018 05:21 AM   HDL 66 08/31/2018 06:30 PM   CHOLHDL 2.4 08/31/2018 06:30 PM   LDLCALC 83 08/31/2018 06:30 PM    Wt Readings from Last 3 Encounters:  01/19/19 158 lb (71.7 kg)  12/16/18 157 lb (71.2 kg)  11/25/18 157 lb 12.8 oz (71.6 kg)     Exam:    Vital Signs:  BP 128/86   Pulse 64   Ht _0  (1.88 m)   Wt 158 lb (71.7 kg)   SpO2 94%   BMI 20.29 kg/m    Well nourished, well developed male in no  acute distress.   ASSESSMENT & PLAN:    1.  Essential hypertension-BP today 128/86 Continue isosorbide dinitrate-hydralazine 20-37.5 mg tablet 3 times daily Continue furosemide  20 mg tablet daily Continue heart healthy low-sodium high-fiber diet Maintain physical activity  2.Takotsubo cardiomyopathy-follow-up echocardiogram June 2020 showed LVEF of 60 to 65% from 20 to 25% Continue isosorbide dinitrate-hydralazine 20-37.5 mg tablet 3 times daily Continue furosemide 20 mg tablet daily Continue 81 mg aspirin daily  3.  Hyperlipidemia-LDL 60 (01/13/2019) Continue pravastatin 10 mg daily Continue heart healthy low-sodium high-fiber diet Maintain physical activity  4.  Heart palpitations-patient started to notice heart palpitations in the evening when he lays down over the last month.  He denies flutters, skipped beats, presyncope and syncope. Repeat EKG in the next month Recommend beta blocking medication based on EKG Provided patient with reassurance and education about palpitations. Avoid caffeine and chocolate.  No exercise to 3 hours before bed.  Disposition: Follow-up with me  in 1 month.   COVID-19 Education: The signs and symptoms of COVID-19 were discussed with the patient and how to seek care for testing (follow up with PCP or arrange E-visit).  The importance of social distancing was discussed today.  Patient Risk:   After full review of this patients clinical status, I feel that they are at least moderate risk at this time.  Time:   Today, I have spent 15 minutes with the patient with telehealth technology discussing Takotsubo syndrome, HTN, and palpitations.     Medication Adjustments/Labs and Tests Ordered: Current medicines are reviewed at length with the patient today.  Concerns regarding medicines are outlined above.   Tests Ordered: No orders of the defined types were placed in this encounter.  Medication Changes: No orders of the defined types were placed in this encounter.   Disposition:  Follow-up with me in 1 month.   Lazarus Gowda, NP-C 01/19/2019 3:18 PM    [facility]

## 2019-01-19 NOTE — Patient Instructions (Signed)
Medication Instructions:   Your physician recommends that you continue on your current medications as directed. Please refer to the Current Medication list given to you today.   If you need a refill on your cardiac medications before your next appointment, please call your pharmacy.   Lab work:  NONE ordered at this time of appointment   If you have labs (blood work) drawn today and your tests are completely normal, you will receive your results only by: [name] Kitchen MyChart Message (if you have MyChart) OR . A paper copy in the mail If you have any lab test that is abnormal or we need to change your treatment, we will call you to review the results.  Testing/Procedures:  NONE ordered at this time of appointment   Follow-Up: At [facility], you and your health needs are our priority.  As part of our continuing mission to provide you with exceptional heart care, we have created designated Provider Care Teams.  These Care Teams include your primary Cardiologist (physician) and Advanced Practice Providers (APPs -  Physician Assistants and Nurse Practitioners) who all work together to provide you with the care you need, when you need it. . You will need a 1 month follow up appointment in office with Coletta Memos, NP   Any Other Special Instructions Will Be Listed Below (If Applicable).  Avoid exercising 2-3 hours before bed

## 2019-01-25 ENCOUNTER — Other Ambulatory Visit: Payer: Self-pay

## 2019-01-25 ENCOUNTER — Encounter: Payer: Self-pay | Admitting: Adult Health

## 2019-01-25 ENCOUNTER — Ambulatory Visit (INDEPENDENT_AMBULATORY_CARE_PROVIDER_SITE_OTHER): Payer: Medicare Other | Admitting: Adult Health

## 2019-01-25 DIAGNOSIS — J9611 Chronic respiratory failure with hypoxia: Secondary | ICD-10-CM

## 2019-01-25 DIAGNOSIS — J441 Chronic obstructive pulmonary disease with (acute) exacerbation: Secondary | ICD-10-CM | POA: Diagnosis not present

## 2019-01-25 DIAGNOSIS — I2699 Other pulmonary embolism without acute cor pulmonale: Secondary | ICD-10-CM

## 2019-01-25 NOTE — Progress Notes (Signed)
@Patient  ID: Jeremiah [name], male    DOB: 20-Dec-1945, 73 y.o.   MRN: [id number]  Chief Complaint  Patient presents with  . Follow-up    COPD     Referring provider: Selena Lesser, MD  HPI: 73yo male former smoker followed for GOLD III COPD,  Hx of PE 08/2018   TEST/EVENTS :  PFTs 10/12/2015FEV1 1.30 (35%) ratio 49 with severe air trapping and no better p saba and dlco 48% -- referred to rehab 01/24/14-completed -Med calendar 02/21/2014>did not recognize it when reprinted 03/29/14 >reviewed , 08/03/2014, 04/28/2015 CT chest 02/2016 no ILD , emphysema changes -Prolonged illness 08/2018 with COPD ,VDRF , PE (not on anticoagulation due to [place]) , Cardiomyopathy, and Intracranial hemorrhage  2 D echo 08/2018 EF 20-25% -?[place] CM, mod RV dysfunction  V Doppler neg for DVT  Echo 09/2018 , EF 60%.  CT head -5 mm acute subcortical hemorrhage in the left frontal lobe. Extensive chronic white matter disease, question amyloid angiopathy given this location. CT chest -Single subsegmental pulmonary embolism to the left lower lobe. Emphysema   01/25/2019 Follow up : COPD  Patient returns for a 6-month follow-up.  Patient has underlying severe COPD.  He did have a prolonged critical illness with a COPD exacerbation and vent dependent respiratory failure in May of this year.  His hospitalization was complicated by a small left lower lobe PE.  And a small 5 mm left intracranial frontal lobe hemorrhage.  He was not treated with anticoagulation.  He also had a stress-induced cardiomyopathy with initial echo with decreased EF at 2025%.  Follow-up echo showed resolution with EF at 60% and a grade 1 diastolic dysfunction.  Patient says since last visit he has been getting stronger each day.  Breathing is doing better feels that he is back to his baseline.  Patient says he is walking daily.  He denies any increased cough or wheezing.  Does get short of breath with heavy activity but his activity  tolerance has improved.  He did have a repeat overnight oximetry test that did not show desaturation.  He is currently on no oxygen.  O2 saturations today on room air are 95%.  He remains on Trelegy daily. Wife is here today with him.  She helps him with his medical care.     No Known Allergies  Immunization History  Administered Date(s) Administered  . Influenza Split 01/13/2013  . Influenza, High Dose Seasonal PF 12/28/2014, 01/15/2016, 01/13/2017, 01/16/2018, 01/11/2019  . Influenza,inj,Quad PF,6+ Mos 01/24/2014  . Pneumococcal Conjugate-13 12/08/2017  . Pneumococcal-Unspecified 04/15/2010  . Tdap 12/28/2014    Past Medical History:  Diagnosis Date  . COPD (chronic obstructive pulmonary disease) ([place])   . Depression   . Dilated cardiomyopathy ([place])   . Hypertension   . PTSD (post-traumatic stress disorder)   . Sleep apnea     Tobacco History: Social History   Tobacco Use  Smoking Status Former Smoker  . Packs/day: 1.00  . Years: 30.00  . Pack years: 30.00  . Types: Cigarettes  . Quit date: 04/16/1987  . Years since quitting: 31.8  Smokeless Tobacco Never Used   Counseling given: Not Answered   Outpatient Medications Prior to Visit  Medication Sig Dispense Refill  . acetaminophen (TYLENOL) 325 MG tablet Take 2 tablets (650 mg total) by mouth every 6 (six) hours as needed for mild pain.    [name] Kitchen albuterol (VENTOLIN HFA) 108 (90 Base) MCG/ACT inhaler Inhale 2 puffs into the lungs every 4 (four)  hours as needed for wheezing or shortness of breath (((PLAN B))). 8 g 5  . aspirin EC 81 MG tablet Take 81 mg by mouth daily.    . carbamazepine (CARBATROL) 200 MG 12 hr capsule Take by mouth.    . esomeprazole (NEXIUM) 40 MG capsule Take 1 capsule (40 mg total) by mouth daily before breakfast. 30 capsule 2  . finasteride (PROSCAR) 5 MG tablet Take by mouth.    . Fluticasone-Umeclidin-Vilant (TRELEGY ELLIPTA) 100-62.5-25 MCG/INH AEPB Inhale 1 puff into the lungs daily. 1 each 5   . furosemide (LASIX) 20 MG tablet Take 1 tablet (20 mg total) by mouth daily. 30 tablet 0  . isosorbide-hydrALAZINE (BIDIL) 20-37.5 MG tablet Take 1 tablet by mouth 3 (three) times daily. 90 tablet 0  . Multiple Vitamin (MULTIVITAMIN WITH MINERALS) TABS tablet Take 1 tablet by mouth daily. 30 tablet 0  . oxybutynin (DITROPAN) 5 MG tablet Take 5 mg by mouth 3 (three) times daily.    . pravastatin (PRAVACHOL) 10 MG tablet Take 1 tablet (10 mg total) by mouth daily. 30 tablet 0  . QUEtiapine (SEROQUEL) 50 MG tablet Take 1 tablet (50 mg total) by mouth at bedtime. 30 tablet 0   No facility-administered medications prior to visit.      Review of Systems:   Constitutional:   No  weight loss, night sweats,  Fevers, chills,  +fatigue, or  lassitude.  HEENT:   No headaches,  Difficulty swallowing,  Tooth/dental problems, or  Sore throat,                No sneezing, itching, ear ache, nasal congestion, post nasal drip,   CV:  No chest pain,  Orthopnea, PND, swelling in lower extremities, anasarca, dizziness, palpitations, syncope.   GI  No heartburn, indigestion, abdominal pain, nausea, vomiting, diarrhea, change in bowel habits, loss of appetite, bloody stools.   Resp:   No chest wall deformity  Skin: no rash or lesions.  GU: no dysuria, change in color of urine, no urgency or frequency.  No flank pain, no hematuria   MS:  No joint pain or swelling.  No decreased range of motion.  No back pain.    Physical Exam  BP 138/74 (BP Location: Left Arm, Cuff Size: Normal)   Pulse 78   Temp 97.7 F (36.5 C) (Temporal)   Ht 6' (1.829 m)   Wt 159 lb 9.6 oz (72.4 kg)   SpO2 95%   BMI 21.65 kg/m   GEN: A/Ox3; pleasant , NAD, well nourished    HEENT:  [place]/AT,  NOSE-clear, THROAT-clear, no lesions, no postnasal drip or exudate noted.   NECK:  Supple w/ fair ROM; no JVD; normal carotid impulses w/o bruits; no thyromegaly or nodules palpated; no lymphadenopathy.    RESP  Clear  P & A; w/o,  wheezes/ rales/ or rhonchi. no accessory muscle use, no dullness to percussion  CARD:  RRR, no m/r/g, no peripheral edema, pulses intact, no cyanosis or clubbing.  GI:   Soft & nt; nml bowel sounds; no organomegaly or masses detected.   Musco: Warm bil, no deformities or joint swelling noted.   Neuro: alert, no focal deficits noted.    Skin: Warm, no lesions or rashes    Lab Results:   BMET   Imaging: No results found.    PFT Results Latest Ref Rng & Units 03/22/2016 01/24/2014  FVC-Pre L 3.33 2.66  FVC-Predicted Pre % 79 54  FVC-Post L 3.53 2.75  FVC-Predicted Post % 84 56  Pre FEV1/FVC % % 55 49  Post FEV1/FCV % % 51 46  FEV1-Pre L 1.82 1.30  FEV1-Predicted Pre % 57 35  FEV1-Post L 1.79 1.26  DLCO UNC% % - 48  DLCO COR %Predicted % - 60  TLC L - 11.28  TLC % Predicted % - 151  RV % Predicted % - 306    No results found for: NITRICOXIDE      Assessment & Plan:   COPD, frequent exacerbations (HCC) Severe COPD.  Currently well controlled on Trelegy.  Patient did have a very critical illness in May of this year.  He seems to have made a full recovery.  We will continue current regimen.  Activity as tolerated.  Plan  Patient Instructions  Continue on TRELEGY  daily . Brush/rinse after use.  Continue on Mucinex DM Twice daily  -use with flutter valve .  Activity as tolerated.  Follow up with Dr. Chase Caller or [place] in 3-4 months and  and As needed   Please contact office for sooner follow up if symptoms do not improve or worsen or seek emergency care         Chronic respiratory failure with hypoxia ([place]) Repeat overnight oximetry test with no desaturations.  No nocturnal oxygen indicated  Pulmonary embolism (HCC) Small PE noted during hospitalization for critical illness.  Anticoagulation was not given due to his intracranial hemorrhage.  Patient has no hypoxemia currently.  O2 saturations are good.  Appears to have recovered from this.  Breathing is  back to baseline and activity level has improved.     Rexene Edison, NP 01/25/2019

## 2019-01-25 NOTE — Patient Instructions (Addendum)
Continue on TRELEGY  daily . Brush/rinse after use.  Continue on Mucinex DM Twice daily  -use with flutter valve .  Activity as tolerated.  Follow up with Dr. Chase Caller or [facility] in 3-4 months and  and As needed   Please contact office for sooner follow up if symptoms do not improve or worsen or seek emergency care

## 2019-01-25 NOTE — Assessment & Plan Note (Signed)
Severe COPD.  Currently well controlled on Trelegy.  Patient did have a very critical illness in May of this year.  He seems to have made a full recovery.  We will continue current regimen.  Activity as tolerated.  Plan  Patient Instructions  Continue on TRELEGY  daily . Brush/rinse after use.  Continue on Mucinex DM Twice daily  -use with flutter valve .  Activity as tolerated.  Follow up with Dr. Chase Caller or [place] in 3-4 months and  and As needed   Please contact office for sooner follow up if symptoms do not improve or worsen or seek emergency care

## 2019-01-25 NOTE — Assessment & Plan Note (Signed)
Small PE noted during hospitalization for critical illness.  Anticoagulation was not given due to his intracranial hemorrhage.  Patient has no hypoxemia currently.  O2 saturations are good.  Appears to have recovered from this.  Breathing is back to baseline and activity level has improved.

## 2019-01-25 NOTE — Assessment & Plan Note (Signed)
Repeat overnight oximetry test with no desaturations.  No nocturnal oxygen indicated

## 2019-01-27 ENCOUNTER — Ambulatory Visit: Payer: Medicare Other | Admitting: Physician Assistant

## 2019-02-02 ENCOUNTER — Other Ambulatory Visit: Payer: Self-pay | Admitting: Cardiology

## 2019-02-02 NOTE — Telephone Encounter (Signed)
Rx request sent to pharmacy.  

## 2019-02-15 ENCOUNTER — Ambulatory Visit: Payer: Medicare Other | Admitting: General Practice

## 2019-02-19 ENCOUNTER — Telehealth: Payer: Medicare Other | Admitting: General Practice

## 2019-02-19 NOTE — Progress Notes (Unsigned)
Virtual Visit via Video Note   This visit type was conducted due to national recommendations for restrictions regarding the COVID-19 Pandemic (e.g. social distancing) in an effort to limit this patient's exposure and mitigate transmission in our community.  Due to his co-morbid illnesses, this patient is at least at moderate risk for complications without adequate follow up.  This format is felt to be most appropriate for this patient at this time.  All issues noted in this document were discussed and addressed.  A limited physical exam was performed with this format.  Please refer to the patient's chart for his consent to telehealth for [facility].  Evaluation Performed:  Follow-up visit  This visit type was conducted due to national recommendations for restrictions regarding the COVID-19 Pandemic (e.g. social distancing).  This format is felt to be most appropriate for this patient at this time.  All issues noted in this document were discussed and addressed.  No physical exam was performed (except for noted visual exam findings with Video Visits).  Please refer to the patient's chart (MyChart message for video visits and phone note for telephone visits) for the patient's consent to telehealth for [facility]  Date:  02/19/2019   ID:  Jeremiah [name], DOB 11/09/1945, MRN [id number]  Patient Location:  [address] [address]   Provider location:   [facility] [address], [place] [address]  PCP:  Selena Lesser, MD  Cardiologist:  Sanda Klein, MD *** Electrophysiologist:  None   Chief Complaint:  ***  History of Present Illness:    Jeremiah [name] is a 73 y.o. male who presents via audio/video conferencing for a telehealth visit today.  Patient verified DOB and address.  The patient {does/does not:200015} symptoms concerning for COVID-19 infection (fever, chills, cough, or new SHORTNESS OF BREATH).    Prior CV studies:   The  following studies were reviewed today:  ***  Past Medical History:  Diagnosis Date  . COPD (chronic obstructive pulmonary disease) ([place])   . Depression   . Dilated cardiomyopathy ([place])   . Hypertension   . PTSD (post-traumatic stress disorder)   . Sleep apnea    Past Surgical History:  Procedure Laterality Date  . HAMMER TOE SURGERY  April 2017     No outpatient medications have been marked as taking for the 02/19/19 encounter (Appointment) with Deberah Pelton, NP.     Allergies:   Patient has no known allergies.   Social History   Tobacco Use  . Smoking status: Former Smoker    Packs/day: 1.00    Years: 30.00    Pack years: 30.00    Types: Cigarettes    Quit date: 04/16/1987    Years since quitting: 31.8  . Smokeless tobacco: Never Used  Substance Use Topics  . Alcohol use: Yes    Alcohol/week: 2.0 standard drinks    Types: 2 Glasses of wine per week  . Drug use: No     Family Hx: The patient's family history includes Asthma in his maternal grandmother; Dementia in his mother; Heart disease in his father.  ROS:   Please see the history of present illness.     All other systems reviewed and are negative.   Labs/Other Tests and Data Reviewed:    Recent Labs: 09/12/2018: B Natriuretic Peptide 193.1 09/19/2018: Magnesium 2.1 09/22/2018: ALT 14; BUN 11; Creatinine, Ser 1.23; Hemoglobin 11.7; Platelets 349; Potassium 3.4; Sodium 138  Recent Lipid Panel Lab Results  Component Value Date/Time   CHOL 159 08/31/2018 06:30 PM   TRIG 76 09/06/2018 05:21 AM   HDL 66 08/31/2018 06:30 PM   CHOLHDL 2.4 08/31/2018 06:30 PM   LDLCALC 83 08/31/2018 06:30 PM    Wt Readings from Last 3 Encounters:  01/25/19 159 lb 9.6 oz (72.4 kg)  01/19/19 158 lb (71.7 kg)  12/16/18 157 lb (71.2 kg)     Exam:    Vital Signs:  There were no vitals taken for this visit.   Well nourished, well developed male in no  acute distress.   ASSESSMENT & PLAN:    1.    COVID-19  Education: The signs and symptoms of COVID-19 were discussed with the patient and how to seek care for testing (follow up with PCP or arrange E-visit).  The importance of social distancing was discussed today.  Patient Risk:   After full review of this patients clinical status, I feel that they are at least moderate risk at this time.  Time:   Today, I have spent *** minutes with the patient with telehealth technology discussing ***.     Medication Adjustments/Labs and Tests Ordered: Current medicines are reviewed at length with the patient today.  Concerns regarding medicines are outlined above.   Tests Ordered: No orders of the defined types were placed in this encounter.  Medication Changes: No orders of the defined types were placed in this encounter.   Disposition:  {follow up:15908}  Signed, Deberah Pelton, NP  02/19/2019 9:45 AM    [facility]

## 2019-03-02 ENCOUNTER — Other Ambulatory Visit: Payer: Self-pay | Admitting: Cardiovascular Disease

## 2019-03-10 ENCOUNTER — Ambulatory Visit: Payer: Medicare Other | Admitting: General Practice

## 2019-03-17 ENCOUNTER — Other Ambulatory Visit: Payer: Self-pay

## 2019-03-17 ENCOUNTER — Encounter: Payer: Medicare Other | Attending: Registered Nurse | Admitting: Physical Medicine & Rehabilitation

## 2019-03-17 ENCOUNTER — Encounter: Payer: Self-pay | Admitting: Physical Medicine & Rehabilitation

## 2019-03-17 VITALS — BP 147/87 | HR 65 | Temp 97.5°F | Ht 74.0 in | Wt 160.0 lb

## 2019-03-17 DIAGNOSIS — I5041 Acute combined systolic (congestive) and diastolic (congestive) heart failure: Secondary | ICD-10-CM | POA: Insufficient documentation

## 2019-03-17 DIAGNOSIS — I10 Essential (primary) hypertension: Secondary | ICD-10-CM | POA: Insufficient documentation

## 2019-03-17 DIAGNOSIS — L03115 Cellulitis of right lower limb: Secondary | ICD-10-CM | POA: Insufficient documentation

## 2019-03-17 MED ORDER — DOXYCYCLINE MONOHYDRATE 100 MG PO TABS: 100.0000 mg | ORAL_TABLET | Freq: Two times a day (BID) | ORAL | 14 refills | Status: DC

## 2019-03-17 NOTE — Patient Instructions (Addendum)
PLEASE FEEL FREE TO CALL OUR OFFICE WITH ANY PROBLEMS OR QUESTIONS [phone])    GENTLY SCRUB YOUR WOUND WITH HIBICLENS TO HELP LOOSEN DEBRIS. OTHERWISE KEEP CLEAN AND DRY.

## 2019-03-17 NOTE — Progress Notes (Signed)
Subjective:    Patient ID: Jeremiah [name], male    DOB: [birth year], 73 y.o.   MRN: [id number]  HPI   Mr. Basaldua is here in follow-up of his intracerebral hemorrhage.  I last saw him in September.  He has continued to make progress.  He is even done some driving with supervision.  He is getting around the house without adaptive device.  He likes to get out in his yard a lot.  Independent with his bathing and self-care.  Denies any shortness of breath lower extremity edema or chest pain.  He has developed a rash or lesions on the right leg which started about 2 weeks ago.  There is been some brownish drainage from these wounds.  The area does not itch nor is it painful to touch.  Denies any fever.  He has a dog but states that the dog is tick free.  Left leg is unaffected.  Nobody else at home is having any problems.    Pain Inventory Average Pain 0 Pain Right Now 0 My pain is na  In the last 24 hours, has pain interfered with the following? General activity 0 Relation with others 0 Enjoyment of life 0 What TIME of day is your pain at its worst? na Sleep (in general) Good  Pain is worse with: na Pain improves with: na Relief from Meds: na  Mobility walk without assistance ability to climb steps?  yes do you drive?  no  Function retired I need assistance with the following:  dressing, bathing, meal prep, household duties and shopping  Neuro/Psych bladder control problems tremor confusion anxiety  Prior Studies Any changes since last visit?  no  Physicians involved in your care Any changes since last visit?  no   Family History  Problem Relation Age of Onset  . Asthma Maternal Grandmother   . Heart disease Father   . Dementia Mother    Social History   Socioeconomic History  . Marital status: Married    Spouse name: Not on file  . Number of children: 2  . Years of education: 29  . Highest education level: Not on file  Occupational History  . Occupation:  Retired Solicitor  . Financial resource strain: Not on file  . Food insecurity    Worry: Not on file    Inability: Not on file  . Transportation needs    Medical: Not on file    Non-medical: Not on file  Tobacco Use  . Smoking status: Former Smoker    Packs/day: 1.00    Years: 30.00    Pack years: 30.00    Types: Cigarettes    Quit date: 04/16/1987    Years since quitting: 31.9  . Smokeless tobacco: Never Used  Substance and Sexual Activity  . Alcohol use: Yes    Alcohol/week: 2.0 standard drinks    Types: 2 Glasses of wine per week  . Drug use: No  . Sexual activity: Not on file  Lifestyle  . Physical activity    Days per week: Not on file    Minutes per session: Not on file  . Stress: Not on file  Relationships  . Social Herbalist on phone: Not on file    Gets together: Not on file    Attends religious service: Not on file    Active member of club or organization: Not on file    Attends meetings of clubs or organizations: Not on  file    Relationship status: Not on file  Other Topics Concern  . Not on file  Social History Narrative   Fun: Live   Denies religious beliefs effecting health care.   Feels safe at home and denies abuse.    Past Surgical History:  Procedure Laterality Date  . HAMMER TOE SURGERY  April 2017   Past Medical History:  Diagnosis Date  . COPD (chronic obstructive pulmonary disease) ([place])   . Depression   . Dilated cardiomyopathy ([place])   . Hypertension   . PTSD (post-traumatic stress disorder)   . Sleep apnea    BP (!) 147/87   Pulse 65   Temp (!) 97.5 F (36.4 C)   Ht 6\' 2"  (1.88 m)   Wt 160 lb (72.6 kg)   SpO2 95%   BMI 20.54 kg/m   Opioid Risk Score:   Fall Risk Score:  `1  Depression screen PHQ 2/9  Depression screen [facility] 2/9 10/13/2018 04/18/2016 12/08/2015 12/28/2014  Decreased Interest 2 - 0 0  Down, Depressed, Hopeless 0 0 0 0  PHQ - 2 Score 2 0 0 0  Altered sleeping 1 - - -  Tired,  decreased energy 3 - - -  Change in appetite 3 - - -  Feeling bad or failure about yourself  0 - - -  Trouble concentrating 3 - - -  Moving slowly or fidgety/restless 0 - - -  Suicidal thoughts 0 - - -  PHQ-9 Score 12 - - -  Some recent data might be hidden     Review of Systems  Constitutional: Negative.   HENT: Negative.   Eyes: Negative.   Respiratory: Negative.   Cardiovascular: Negative.   Gastrointestinal: Positive for constipation.  Endocrine: Negative.   Genitourinary: Positive for difficulty urinating.  Musculoskeletal: Negative.   Skin: Negative.   Allergic/Immunologic: Negative.   Neurological: Positive for tremors.  Hematological: Negative.   Psychiatric/Behavioral: Positive for confusion. The patient is nervous/anxious.   All other systems reviewed and are negative.      Objective:   Physical Exam  Gen: no distress, normal appearing HEENT: oral mucosa pink and moist, NCAT Cardio: Reg rate Chest: normal effort, normal rate of breathing Abd: soft, non-distended Ext: Trace lower extremity edema Skin: Numerous punctate, approximately 1 to 2 mm skin ruptures are noted in a sporadic pattern around the mid shin and calf.  He has 2 areas where these ruptures coalesce into a bigger collection.  This seems to be covered with some dried fibrinous material/scab.  None of the areas are sensitive to touch.  There is some mild erythema surrounding the larger areas as well as the smaller spots.  Skin is not frankly warm.   Neuro: good balance. Strength nearly 5/5.  Normal balance.  Sensation grossly intact.  Gait is stable.  Much improved insight and awareness.  Musculoskeletal: normal Psych: pleasant, normal affect        Assessment & Plan:  1. Intracerebral hemorrhage:               -continue HEP.  2. Small Left Lower Pulmonary Embolism: Continue low dose aspirin. Pulmonary Following.  3. Acute Diastolic CHF: Cardiology Following.   -appears euvolemic 4.  Essential Hypertension:  bp per primary/cards 5 RLE lesions:  -Suspect that these are insect bites/cellulitis  -7-day trial of doxycycline 100 mg twice daily  -Advised to USE Hibiclens for the areas once a day to help clean the area as well as to remove  some of the fibronecrotic debris and scab  -Otherwise needs to keep area clean and dry   -Advised him to call me if he has any further problems with this   Patient is doing extremely well from a standpoint of his intracerebral hemorrhage.  I will see him back on as-needed basis.  Today I spent 15 minutes in the room in direct consultation.

## 2019-03-19 ENCOUNTER — Ambulatory Visit: Payer: Medicare Other | Admitting: General Practice

## 2019-03-24 ENCOUNTER — Ambulatory Visit: Payer: Medicare Other | Admitting: Adult Health

## 2019-04-13 ENCOUNTER — Telehealth: Payer: Self-pay | Admitting: Cardiovascular Disease

## 2019-04-13 NOTE — Telephone Encounter (Signed)
Reviewed pt chart. Pt last evaluated by [facility] on 10/6 by Coletta Memos, NP. Pt current med list on 10/6 AVS did not include amlodipine but amlodipine 2.5 mg Rx was refilled by [facility] on 11/17.  Contacted [facility] and spoke with on-call MD. Informed her that triage nurse calling regarding pt med list reconciliation for amlodipine. She states medication is not on pt current med list in their system.  DPR on file. Contacted pt wife. She states she keeps amlodipine in a bag marked "no" for meds he is not supposed to take, but noticed that med is one [facility] med list. She states pt 'doing great' without amlodipine with Bps running around 135/80. She would like med removed from med list. Informed her that nurse would route encounter to Dr. Sallyanne Kuster to confirm that this is okay and then med list can be updated

## 2019-04-13 NOTE — Telephone Encounter (Signed)
Pt c/o medication issue:  1. Name of Medication: amLODipine (NORVASC) 2.5 MG tablet  2. How are you currently taking this medication (dosage and times per day)?   3. Are you having a reaction (difficulty breathing--STAT)? no  4. What is your medication issue? Wife wants to know if patient needs to be taking this medication.  The pharmacy called the wife and states that the patient has a refill of this medication. This medication is not on the patient's med list for his PCP at [place], but it is on his list at [facility].  His wife keeps all of his current medications in one bag, and all of the medications he is not supposed to take in a separate bag marked "no". This medication was placed in the "no" bag, and the wife just wants clarification. If he is not supposed to be taking it, she would like it taken off of his [facility] list. Either way,  she will let the pharmacy know what to do with the refill

## 2019-04-15 NOTE — Telephone Encounter (Signed)
Please remove amlodipine from list.

## 2019-04-15 NOTE — Telephone Encounter (Signed)
The patient's wife has been made aware that Amlodipine has been taken off of the patient's list.

## 2019-04-15 NOTE — Telephone Encounter (Signed)
Left a message for the patient to call back.  

## 2019-05-19 ENCOUNTER — Telehealth: Payer: Self-pay | Admitting: Adult Health

## 2019-05-19 NOTE — Telephone Encounter (Signed)
Based on his insurance, would he qualify for any patient assistance to help offset the cost of the medication. The cards we have are for commercial insurance only.  Any help you give would be appreciated.

## 2019-05-20 NOTE — Telephone Encounter (Signed)
Triple therapy meds are usually in higher tiers. Looked online at patient's 2021 formulary, for brand named medications his portion is 20%. Trelegy is preferred, no PA required. Ran test claim for Trelegy Ellipta- patient copay is $116.67 for 1 month supply. Claim did not show any deductibles being applied.   [employer] is the patient assistance program for Trelegy, they require Medicare households to have spent $600 on prescriptions and met their income guidelines to qualify.   Called patient. He had me speak with his wife. She stated that they do not want to switch medications. She mentioned that he also has active VA coverage and benefits. I checked [employer]'s 2021 formulary and Trelegy is covered. She would like Trelegy prescription sent to their [place]. I advised her that I was not familiar with that process and I would forward to the staff.   12:06 PM Beatriz Chancellor, CPhT

## 2019-05-20 NOTE — Telephone Encounter (Signed)
Spoke with patient's wife per DPR, let them know we didn't have any coupons, but the message was sent the the pharmacy team to follow up on. Wife did share with me that the increase in cost ($100/inhaler) did start after the first of the year.  Pharmacy team, would this mean we would have to do a PA or are there other options for patient? They also mentioned they were getting 3 inhalers at a time and now they are only getting one, would this make a difference?

## 2019-05-21 MED ORDER — TRELEGY ELLIPTA 100-62.5-25 MCG/INH IN AEPB: 1.0000 | INHALATION_SPRAY | Freq: Every day | RESPIRATORY_TRACT | 5 refills | Status: DC

## 2019-05-21 NOTE — Telephone Encounter (Signed)
I called the pt and he informed me that he uses the [facility] [place]. I Called the [facility] and received a fax number for the pharmacy so we can send in the Trelegy. The fax number is [phone]. I printed Rx and will have MR sign then fax to [facility].

## 2019-05-24 NOTE — Telephone Encounter (Signed)
MR is back in the office on 06/01/19.

## 2019-05-25 ENCOUNTER — Telehealth: Payer: Medicare Other | Admitting: Cardiovascular Disease

## 2019-05-25 MED ORDER — TRELEGY ELLIPTA 100-62.5-25 MCG/INH IN AEPB: 1.0000 | INHALATION_SPRAY | Freq: Every day | RESPIRATORY_TRACT | 5 refills | Status: DC

## 2019-05-25 NOTE — Telephone Encounter (Signed)
Noted  

## 2019-05-25 NOTE — Telephone Encounter (Signed)
Will route to EP to follow up with MR in clinic

## 2019-05-27 ENCOUNTER — Telehealth: Payer: Medicare Other | Admitting: Cardiovascular Disease

## 2019-06-01 NOTE — Telephone Encounter (Signed)
MR is in office today. Rx has been placed in MR's sign folder and has been given to him. Once folder has been returned back to me and once it has been faxed, I will update message.

## 2019-06-02 ENCOUNTER — Telehealth (INDEPENDENT_AMBULATORY_CARE_PROVIDER_SITE_OTHER): Payer: Medicare Other | Admitting: Physician Assistant

## 2019-06-02 ENCOUNTER — Encounter: Payer: Self-pay | Admitting: Physician Assistant

## 2019-06-02 VITALS — BP 140/80 | Ht 74.0 in | Wt 158.0 lb

## 2019-06-02 DIAGNOSIS — I2699 Other pulmonary embolism without acute cor pulmonale: Secondary | ICD-10-CM

## 2019-06-02 DIAGNOSIS — I5181 Takotsubo syndrome: Secondary | ICD-10-CM | POA: Diagnosis not present

## 2019-06-02 DIAGNOSIS — I611 Nontraumatic intracerebral hemorrhage in hemisphere, cortical: Secondary | ICD-10-CM

## 2019-06-02 DIAGNOSIS — I10 Essential (primary) hypertension: Secondary | ICD-10-CM

## 2019-06-02 DIAGNOSIS — R002 Palpitations: Secondary | ICD-10-CM

## 2019-06-02 DIAGNOSIS — E785 Hyperlipidemia, unspecified: Secondary | ICD-10-CM

## 2019-06-02 NOTE — Patient Instructions (Signed)
Medication Instructions:  Your physician recommends that you continue on your current medications as directed. Please refer to the Current Medication list given to you today.  *If you need a refill on your cardiac medications before your next appointment, please call your pharmacy*  Lab Work: Your physician recommends that you return for lab work in: [place] If you have labs (blood work) drawn today and your tests are completely normal, you will receive your results only by: [name] Kitchen MyChart Message (if you have MyChart) OR . A paper copy in the mail If you have any lab test that is abnormal or we need to change your treatment, we will call you to review the results.  Testing/Procedures: NONE ordered at this time of appointment   Follow-Up: At [facility], you and your health needs are our priority.  As part of our continuing mission to provide you with exceptional heart care, we have created designated Provider Care Teams.  These Care Teams include your primary Cardiologist (physician) and Advanced Practice Providers (APPs -  Physician Assistants and Nurse Practitioners) who all work together to provide you with the care you need, when you need it.  Your next appointment:   4 month(s)  The format for your next appointment:   In Person  Provider:   Sanda Klein, MD or Almyra Deforest, PA-C  Other Instructions

## 2019-06-02 NOTE — Progress Notes (Signed)
Virtual Visit via Telephone Note   This visit type was conducted due to national recommendations for restrictions regarding the COVID-19 Pandemic (e.g. social distancing) in an effort to limit this patient's exposure and mitigate transmission in our community.  Due to his co-morbid illnesses, this patient is at least at moderate risk for complications without adequate follow up.  This format is felt to be most appropriate for this patient at this time.  The patient did not have access to video technology/had technical difficulties with video requiring transitioning to audio format only (telephone).  All issues noted in this document were discussed and addressed.  No physical exam could be performed with this format.  Please refer to the patient's chart for his  consent to telehealth for [facility].   Date:  06/04/2019   ID:  Jeremiah [name], DOB Feb 12, 1946, MRN [id number]  Patient Location: Home Provider Location: Office  PCP:  Selena Lesser, MD  Cardiologist:  Sanda Klein, MD Electrophysiologist:  None   Evaluation Performed:  Follow-Up Visit  Chief Complaint:  followup  History of Present Illness:    Jeremiah [name] is a 74 y.o. male with past medical history of HTN, HLD, COPD, OSA, PTSD, Takotsubo cardiomyopathy, history of intracranial bleed, and history of PE.  Patient was admitted in May 2020 with small segmental PE and intracranial bleed.  He required intubation during the hospitalization due to bronchospasm and also developed worsening renal function.  Echocardiogram demonstrated severely reduced EF of 25%, findings consistent with Takotsubo cardiomyopathy, grade 2 DD.  He also had elevated troponin which was felt to be demand ischemia.  Follow-up echocardiogram on 6/2 showed EF normalized to 60 to 65%, grade 1 DD.  Patient presents today for a very delayed 1 month visit.  He was contacted via telephone.  He denies any recent exertional chest pain or shortness of breath.  He is  doing extremely well and has been able to walk around without any issue.  Given lack of anginal discomfort, I decided to hold off on ischemic work-up at this time especially since his EF has normalized.  His blood pressure is well controlled on current therapy.  He is urinating quite a lot, since EF has improved, I decided to repeat a basic metabolic panel to make sure the current dose of Lasix is not dehydrating him.  His nighttime palpitation has also resolved as well since the last office visit.  He can follow-up with Dr. Sallyanne Kuster in 4 months.   The patient does not have symptoms concerning for COVID-19 infection (fever, chills, cough, or new shortness of breath).    Past Medical History:  Diagnosis Date  . COPD (chronic obstructive pulmonary disease) ([place])   . Depression   . Dilated cardiomyopathy ([place])   . Hypertension   . PTSD (post-traumatic stress disorder)   . Sleep apnea    Past Surgical History:  Procedure Laterality Date  . HAMMER TOE SURGERY  April 2017     Current Meds  Medication Sig  . acetaminophen (TYLENOL) 325 MG tablet Take 2 tablets (650 mg total) by mouth every 6 (six) hours as needed for mild pain.  [name] Kitchen albuterol (VENTOLIN HFA) 108 (90 Base) MCG/ACT inhaler Inhale 2 puffs into the lungs every 4 (four) hours as needed for wheezing or shortness of breath (((PLAN B))).  [name] Kitchen aspirin EC 81 MG tablet Take 81 mg by mouth daily.  . carbamazepine (CARBATROL) 200 MG 12 hr capsule Take by mouth.  . carbamazepine (TEGRETOL) 200  MG tablet Take 200 mg by mouth 2 (two) times daily.  [name] Kitchen doxycycline (ADOXA) 100 MG tablet Take 1 tablet (100 mg total) by mouth 2 (two) times daily.  [name] Kitchen esomeprazole (NEXIUM) 40 MG capsule Take 1 capsule (40 mg total) by mouth daily before breakfast.  . finasteride (PROSCAR) 5 MG tablet Take by mouth.  . Fluticasone-Umeclidin-Vilant (TRELEGY ELLIPTA) 100-62.5-25 MCG/INH AEPB Inhale 1 puff into the lungs daily.  . furosemide (LASIX) 20 MG tablet Take 1  tablet (20 mg total) by mouth daily.  . isosorbide-hydrALAZINE (BIDIL) 20-37.5 MG tablet Take 1 tablet by mouth 3 (three) times daily.  . isosorbide-hydrALAZINE (BIDIL) 20-37.5 MG tablet Take by mouth.  . Multiple Vitamin (MULTIVITAMIN WITH MINERALS) TABS tablet Take 1 tablet by mouth daily.  [name] Kitchen oxybutynin (DITROPAN) 5 MG tablet Take 5 mg by mouth 3 (three) times daily.  . pravastatin (PRAVACHOL) 10 MG tablet Take 1 tablet (10 mg total) by mouth daily.  . QUEtiapine (SEROQUEL) 50 MG tablet Take 1 tablet (50 mg total) by mouth at bedtime.  . triamcinolone ointment (KENALOG) 0.1 % Apply topically 2 (two) times daily.     Allergies:   Patient has no known allergies.   Social History   Tobacco Use  . Smoking status: Former Smoker    Packs/day: 1.00    Years: 30.00    Pack years: 30.00    Types: Cigarettes    Quit date: 04/16/1987    Years since quitting: 32.1  . Smokeless tobacco: Never Used  Substance Use Topics  . Alcohol use: Yes    Alcohol/week: 2.0 standard drinks    Types: 2 Glasses of wine per week  . Drug use: No     Family Hx: The patient's family history includes Asthma in his maternal grandmother; Dementia in his mother; Heart disease in his father.  ROS:   Please see the history of present illness.     All other systems reviewed and are negative.   Prior CV studies:   The following studies were reviewed today:  Echo 09/15/2018 1. The left ventricle has normal systolic function, with an ejection  fraction of 60-65%. The cavity size was normal. Left ventricular diastolic  Doppler parameters are consistent with impaired relaxation. No evidence of  left ventricular regional wall  motion abnormalities.  2. The right ventricle has normal systolc function. The cavity was  normal.  3. Small pericardial effusion.  4. The pericardial effusion is circumferential.  5. The aortic root and ascending aorta are normal in size and structure.  6. When compared to the prior  study: Compared to 08/31/2018, there is  complete normalization of left ventricular wall motion and overall  systolic function. The pericardial effusion is unchanged.   Labs/Other Tests and Data Reviewed:    EKG:  An ECG dated 10/09/2018 was personally reviewed today and demonstrated:  Normal sinus rhythm with deep symmetrical anterior T wave inversion  Recent Labs: 09/12/2018: B Natriuretic Peptide 193.1 09/19/2018: Magnesium 2.1 09/22/2018: ALT 14; BUN 11; Creatinine, Ser 1.23; Hemoglobin 11.7; Platelets 349; Potassium 3.4; Sodium 138   Recent Lipid Panel Lab Results  Component Value Date/Time   CHOL 159 08/31/2018 06:30 PM   TRIG 76 09/06/2018 05:21 AM   HDL 66 08/31/2018 06:30 PM   CHOLHDL 2.4 08/31/2018 06:30 PM   LDLCALC 83 08/31/2018 06:30 PM    Wt Readings from Last 3 Encounters:  06/02/19 158 lb (71.7 kg)  03/17/19 160 lb (72.6 kg)  01/25/19 159 lb 9.6 oz (  72.4 kg)     Objective:    Vital Signs:  BP 140/80   Ht 6\' 2"  (1.88 m)   Wt 158 lb (71.7 kg)   BMI 20.29 kg/m    VITAL SIGNS:  reviewed  ASSESSMENT & PLAN:    1. Takotsubo cardiomyopathy:   -EF normalized by echocardiogram on 09/15/2018.  No sign of significant heart failure.  Obtain basic metabolic panel to make sure he is not dehydrated on the current diuretic.  2. Hypertension: Blood pressure stable on current therapy  3. Hyperlipidemia: Continue pravastatin  4. History of PE: Occurred in May 2020  5. History of intracranial bleed: No significant mental status change recently.  COVID-19 Education: The signs and symptoms of COVID-19 were discussed with the patient and how to seek care for testing (follow up with PCP or arrange E-visit).  The importance of social distancing was discussed today.  Time:   Today, I have spent 6 minutes with the patient with telehealth technology discussing the above problems.     Medication Adjustments/Labs and Tests Ordered: Current medicines are reviewed at length with  the patient today.  Concerns regarding medicines are outlined above.   Tests Ordered: Orders Placed This Encounter  Procedures  . Basic metabolic panel    Medication Changes: No orders of the defined types were placed in this encounter.   Follow Up:  Either In Person or Virtual in 4 month(s)  Signed, Almyra Deforest, [place]  06/04/2019 10:10 AM    [place]

## 2019-06-08 ENCOUNTER — Telehealth: Payer: Self-pay | Admitting: Adult Health

## 2019-06-08 NOTE — Telephone Encounter (Signed)
Pt's Trelegy Rx was faxed to [facility] today for pt. Called and spoke with pt's wife letting her know this had been done and she verbalized understanding. Nothing further needed.

## 2019-06-08 NOTE — Telephone Encounter (Signed)
Wife calling back to see if we sent [place] [phone]

## 2019-06-08 NOTE — Telephone Encounter (Signed)
I have faxed pt's Trelegy Rx to Dr. Lavone Neri at the provided number by pt's wife [name]. Attempted to call but unable to reach. Left a detailed message for [name] that this was done. Nothing further needed.

## 2019-06-10 ENCOUNTER — Telehealth: Payer: Self-pay | Admitting: Adult Health

## 2019-06-11 MED ORDER — TRELEGY ELLIPTA 100-62.5-25 MCG/INH IN AEPB: 1.0000 | INHALATION_SPRAY | Freq: Every day | RESPIRATORY_TRACT | 5 refills | Status: DC

## 2019-06-11 MED ORDER — TRELEGY ELLIPTA 100-62.5-25 MCG/INH IN AEPB: 1.0000 | INHALATION_SPRAY | Freq: Every day | RESPIRATORY_TRACT | 0 refills | Status: DC

## 2019-06-11 NOTE — Telephone Encounter (Signed)
Faxed to the correct number listed  1 sample given per spouses request to last until pt can get from [place]  Nothing further needed

## 2019-06-11 NOTE — Telephone Encounter (Signed)
Jeremiah [name] states Trelegy needs to be faxed to [facility] fax number [phone].

## 2019-06-14 ENCOUNTER — Telehealth: Payer: Self-pay | Admitting: Adult Health

## 2019-06-14 NOTE — Telephone Encounter (Signed)
Jeremiah [name] wife will be coming to pickup samples 06/15/2019 at our office.  Would like to pickup copy of office note with samples.  Jeremiah [name] phone number is [phone].

## 2019-06-14 NOTE — Telephone Encounter (Signed)
Spoke with pt's wife, requesting a copy of last office note with sample.  Trelegy sample was left up front for pt on 2/26.  OV note was printed and placed up front with sample for pickup.  Nothing further needed at this time- will close encounter.

## 2019-06-21 ENCOUNTER — Other Ambulatory Visit: Payer: Self-pay

## 2019-06-21 ENCOUNTER — Telehealth: Payer: Self-pay

## 2019-06-21 ENCOUNTER — Encounter: Payer: Self-pay | Admitting: Adult Health

## 2019-06-21 ENCOUNTER — Ambulatory Visit (INDEPENDENT_AMBULATORY_CARE_PROVIDER_SITE_OTHER): Payer: Medicare Other | Admitting: Adult Health

## 2019-06-21 VITALS — BP 141/78 | HR 65 | Temp 97.7°F | Ht 74.0 in | Wt 160.0 lb

## 2019-06-21 DIAGNOSIS — E785 Hyperlipidemia, unspecified: Secondary | ICD-10-CM | POA: Diagnosis not present

## 2019-06-21 DIAGNOSIS — I610 Nontraumatic intracerebral hemorrhage in hemisphere, subcortical: Secondary | ICD-10-CM | POA: Diagnosis not present

## 2019-06-21 DIAGNOSIS — I10 Essential (primary) hypertension: Secondary | ICD-10-CM | POA: Diagnosis not present

## 2019-06-21 DIAGNOSIS — I69319 Unspecified symptoms and signs involving cognitive functions following cerebral infarction: Secondary | ICD-10-CM

## 2019-06-21 NOTE — Telephone Encounter (Signed)
Called and spoke directly with patients wife. Per NP Frann Rider. Pt is also seen by [facility], Provider states that since he is seen by the other neurologist the pt can continue to be seen by [facility].   Pts wife states that he would rather be seen by Janett Billow and would keep todays appointment. The patient is going to d/c being seen by [facility].   Janett Billow states that he would need a MMSE when he arrives for his appointment today at 1245.

## 2019-06-21 NOTE — Progress Notes (Signed)
I agree with the above plan 

## 2019-06-21 NOTE — Progress Notes (Signed)
[facility] [address] [place]. [place] [address] [phone]       STROKE FOLLOW UP NOTE  Jeremiah [name] Date of Birth:  05-31-1945 Medical Record Number:  [id number]   Reason for Referral: stroke follow up    CHIEF COMPLAINT:  Chief Complaint  Patient presents with  . Follow-up    Rm9. With wife. Memory is hit or miss.     HPI:   Jeremiah [name] is a 74 year old male who is being seen today, 06/21/2019, for follow-up regarding prior stroke and cognitive impairment accompanied by his wife.  Since prior visit, memory loss/cognition has been stable with MMSE today 23/30. Wife does report wax/waning of symptoms but overall stable.  Continues on aspirin 81 mg daily and pravastatin for secondary stroke prevention without side effects.  Blood pressure today 141/78.  Will occasionally monitor at home and typically SBP 120s but can fluctuate.  He does report pulsatile tinnitus in the ears bilaterally which has been present "for some time" without changes.  No concerns at this time.    History copied from recommended purposes only Stroke admission 08/31/2018: Jeremiah [name] is a 74 y.o. male with history of dementia, COPD, HTN, OSA admitted for SOB.  MRI showed left frontal punctate infarcts and tiny ICH with hemosiderosis consistent with CAA.  Vessel imaging unremarkable.  Lower extremity venous Dopplers negative for DVT.  2D echo EF of 20-25%.  LDL 83 and A1c 5.6.  Fluctuation of blood pressures during admission with underlying history of HTN.  Recommended continuation of pravastatin 10 mg daily for HLD management.  CT chest showed small LLL segmental PE but unable to initiate AC due to CAA.  Consider initiating aspirin 81 mg if repeat CT head stable.  EEG performed which showed encephalopathic state but no seizure activity.  He was discharged to CIR for ongoing deficits and then discharged home in stable condition with recommendation of 24/7 supervision and home health  PT/OT/SLP and nursing.  Initial visit 11/18/2018: He has been stable from a neurological standpoint since discharge.  Wife is unsure of mental status worsening as it will wax/wane daily.  He was recently evaluated prior to hospitalization for concerns of memory loss but fortunately did not have follow-up with provider due to being hospitalized.  Denies behavioral concerns.  He continues to work with physical therapy due to his gait and overall strengthening.  He is able to ambulate with rolling walker at home but currently in wheelchair due to distance.  Wife plans on working with [facility] in order to obtain additional speech therapy.  He continues on aspirin 81 mg without bleeding or bruising.  Continues on pravastatin without myalgias.  Blood pressure today 148/86 which is slightly elevated as home readings typically SBP 110-120s.  No further concerns at this time.  Denies new or worsening stroke/TIA symptoms.    ROS:   14 system review of systems performed and negative with exception of memory loss, hearing loss, pulsatile tinnitus  PMH:  Past Medical History:  Diagnosis Date  . COPD (chronic obstructive pulmonary disease) ([place])   . Depression   . Dilated cardiomyopathy ([place])   . Hypertension   . PTSD (post-traumatic stress disorder)   . Sleep apnea     PSH:  Past Surgical History:  Procedure Laterality Date  . HAMMER TOE SURGERY  April 2017    Social History:  Social History   Socioeconomic History  . Marital status: Married    Spouse name: Not on file  .  Number of children: 2  . Years of education: 70  . Highest education level: Not on file  Occupational History  . Occupation: Retired Armed forces technical officer  . Smoking status: Former Smoker    Packs/day: 1.00    Years: 30.00    Pack years: 30.00    Types: Cigarettes    Quit date: 04/16/1987    Years since quitting: 32.2  . Smokeless tobacco: Never Used  Substance and Sexual Activity  . Alcohol use: Yes    Alcohol/week:  2.0 standard drinks    Types: 2 Glasses of wine per week  . Drug use: No  . Sexual activity: Not on file  Other Topics Concern  . Not on file  Social History Narrative   Fun: Live   Denies religious beliefs effecting health care.   Feels safe at home and denies abuse.    Social Determinants of Health   Financial Resource Strain:   . Difficulty of Paying Living Expenses: Not on file  Food Insecurity:   . Worried About Charity fundraiser in the Last Year: Not on file  . Ran Out of Food in the Last Year: Not on file  Transportation Needs:   . Lack of Transportation (Medical): Not on file  . Lack of Transportation (Non-Medical): Not on file  Physical Activity:   . Days of Exercise per Week: Not on file  . Minutes of Exercise per Session: Not on file  Stress:   . Feeling of Stress : Not on file  Social Connections:   . Frequency of Communication with Friends and Family: Not on file  . Frequency of Social Gatherings with Friends and Family: Not on file  . Attends Religious Services: Not on file  . Active Member of Clubs or Organizations: Not on file  . Attends Archivist Meetings: Not on file  . Marital Status: Not on file  Intimate Partner Violence:   . Fear of Current or Ex-Partner: Not on file  . Emotionally Abused: Not on file  . Physically Abused: Not on file  . Sexually Abused: Not on file    Family History:  Family History  Problem Relation Age of Onset  . Asthma Maternal Grandmother   . Heart disease Father   . Dementia Mother     Medications:   Current Outpatient Medications on File Prior to Visit  Medication Sig Dispense Refill  . acetaminophen (TYLENOL) 325 MG tablet Take 2 tablets (650 mg total) by mouth every 6 (six) hours as needed for mild pain.    [name] Kitchen albuterol (VENTOLIN HFA) 108 (90 Base) MCG/ACT inhaler Inhale 2 puffs into the lungs every 4 (four) hours as needed for wheezing or shortness of breath (((PLAN B))). 8 g 5  . aspirin EC 81 MG  tablet Take 81 mg by mouth daily.    . carbamazepine (TEGRETOL) 200 MG tablet Take 200 mg by mouth 2 (two) times daily.    [name] Kitchen esomeprazole (NEXIUM) 40 MG capsule Take 1 capsule (40 mg total) by mouth daily before breakfast. 30 capsule 2  . finasteride (PROSCAR) 5 MG tablet Take by mouth.    . Fluticasone-Umeclidin-Vilant (TRELEGY ELLIPTA) 100-62.5-25 MCG/INH AEPB Inhale 1 puff into the lungs daily. 1 each 5  . furosemide (LASIX) 20 MG tablet Take 1 tablet (20 mg total) by mouth daily. 30 tablet 0  . isosorbide-hydrALAZINE (BIDIL) 20-37.5 MG tablet Take 1 tablet by mouth 3 (three) times daily. 90 tablet 0  . Multiple Vitamin (  MULTIVITAMIN WITH MINERALS) TABS tablet Take 1 tablet by mouth daily. 30 tablet 0  . oxybutynin (DITROPAN) 5 MG tablet Take 5 mg by mouth 3 (three) times daily.    . pravastatin (PRAVACHOL) 10 MG tablet Take 1 tablet (10 mg total) by mouth daily. 30 tablet 0  . QUEtiapine (SEROQUEL) 50 MG tablet Take 1 tablet (50 mg total) by mouth at bedtime. 30 tablet 0  . triamcinolone ointment (KENALOG) 0.1 % Apply topically 2 (two) times daily.     No current facility-administered medications on file prior to visit.    Allergies:  No Known Allergies   Physical Exam  Vitals:   06/21/19 1234  BP: (!) 141/78  Pulse: 65  Temp: 97.7 F (36.5 C)  Weight: 160 lb (72.6 kg)  Height: 6\' 2"  (1.88 m)   Body mass index is 20.54 kg/m. No exam data present  General: well developed, well nourished, pleasant elderly African-American male, seated, in no evident distress Head: head normocephalic and atraumatic.   Neck: supple with no carotid or supraclavicular bruits Cardiovascular: regular rate and rhythm, no murmurs Musculoskeletal: no deformity Skin:  no rash/petichiae Vascular:  Normal pulses all extremities   Neurologic Exam Mental Status: Awake and fully alert.   Normal speech and language.  Oriented to place and time.  Recent and remote memory mildly diminished. Attention span,  concentration and fund of knowledge during visit. Mood and affect appropriate.   MMSE - Mini Mental State Exam 06/21/2019 11/18/2018 12/28/2014  Orientation to time 4 5 5   Orientation to Place 5 5 5   Registration 3 3 3   Attention/ Calculation 1 3 5   Recall 1 1 3   Language- name 2 objects 2 2 2   Language- repeat 1 1 1   Language- follow 3 step command 3 3 3   Language- read & follow direction 1 1 1   Write a sentence 1 1 1   Copy design 1 1 1   Copy design-comments - named 7 animals -  Total score 23 26 30    Cranial Nerves: Pupils equal, briskly reactive to light. Extraocular movements full without nystagmus. Visual fields full to confrontation. Hearing intact. Facial sensation intact. Face, tongue, palate moves normally and symmetrically.  Motor: Normal bulk and tone. Normal strength in all tested extremity muscles. Sensory.: intact to touch , pinprick , position and vibratory sensation.  Coordination: Rapid alternating movements normal in all extremities. Finger-to-nose and heel-to-shin performed accurately bilaterally.  Gait and Station: Stands from seated position without difficulty.  Stance is normal.  Currently ambulating with a cane with normal stride length and balance Reflexes: 1+ and symmetric. Toes downgoing.        Diagnostic Data (Labs, Imaging, Testing)  Ct Head Wo Contrast 09/02/2018 IMPRESSION:  1. Unchanged small hemorrhage in the posterior left frontal lobe.  2. Unchanged focal density in a left parietal sulcus which may be vascular or indicative of trace subarachnoid hemorrhage.  3. No new intracranial hemorrhage.  4. Severe chronic small vessel ischemic disease.   Ct Head Wo Contrast 08/31/2018  IMPRESSION:  1. 5 mm acute subcortical hemorrhage in the left frontal lobe. Extensive chronic white matter disease, question amyloid angiopathy given this location.  2. Attention to a minimally high-dense left parietal sulcus on follow-up.   Ct Angio Chest Pe W And/or Wo  Contrast 08/31/2018 IMPRESSION:  1. Single subsegmental pulmonary embolism to the left lower lobe.  2. Coronary atherosclerosis.  3. Emphysema.    Mr Jeremiah [name] Head Wo Contrast 09/02/2018 IMPRESSION:   MRI  head:  1. 5 mm focus of acute hemorrhage in the left frontal lobe is associated with a small subcortical acute/early subacute infarction. Additional punctate foci of acute/early subacute infarction are present in the left anterior frontal lobe and the left temporal lobe.  2. Numerous foci of chronic hemorrhage throughout the brain in a predominant peripheral distribution as well as superficial siderosis over the left-greater-than-right cerebral convexities. The peripheral distribution favors amyloid angiopathy over chronic sequelae of hypertension.  3. Severe chronic microvascular ischemic changes and mild to moderate volume loss of the brain.   MRA head:  Patent anterior and posterior intracranial circulation. No large vessel occlusion, aneurysm, or significant stenosis is identified.   Vas [place] Lower Extremity Venous (dvt) 08/31/2018 Summary:  Right: There is no evidence of deep vein thrombosis in the lower extremity. No cystic structure found in the popliteal fossa.  Left: There is no evidence of deep vein thrombosis in the lower extremity. No cystic structure found in the popliteal fossa.   EEG 09/03/2018 This isa technically difficultelectroencephalogramdue to the presence of muscle and movement artifact. The portion able to be evaluated was abnormal due to general background slowing andthe presence of triphasic waves. These are seen most commonly in encephalopathic states. An influence of medication effect is likely present as well.   ASSESSMENT: Jeremiah [name] is a 74 y.o. year old male here with CAA with punctate infarcts and micro-bleeds as well as left hemisphere hemosiderosis on 08/31/2018. Vascular risk factors include HTN, HLD, OSA, MCI, cardiomyopathy.  Residual  deficit of cognitive impairment which has been stable    PLAN:  1. Recent stroke: Continue aspirin 81 mg daily  and atorvastatin for secondary stroke prevention. Maintain strict control of hypertension with blood pressure goal below 130/90, diabetes with hemoglobin A1c goal below 6.5% and cholesterol with LDL cholesterol (bad cholesterol) goal below 70 mg/dL.  I also advised the patient to eat a healthy diet with plenty of whole grains, cereals, fruits and vegetables, exercise regularly with at least 30 minutes of continuous activity daily and maintain ideal body weight. 2. Possible CAA: Discussion again with patient and wife regarding importance of managing risk factors to prevent reoccurring bleeds 3. Vascular cognitive impairment: MMSE slight decrease but subjectively stable.  He questions use of Pravagen which he saw on TV - advised would not recommend due to cardiac conditions and stroke history. May consider initiating namenda in the future if indicated  4. HTN: Advised to continue current treatment regimen. Advised to continue to monitor at home along with continued follow-up with PCP for management 5. HLD: Advised to continue current treatment regimen along with continued follow-up with PCP for future prescribing and monitoring of lipid panel 6. OSA: Ongoing compliance with CPAP for OSA management 7. Cardiomyopathy/PE: Continue to follow with cardiology for ongoing monitoring and management    Follow up in 6 months or call earlier if needed   I spent 34 minutes of face-to-face and non-face-to-face time with patient.  This included previsit chart review, lab review, study review, order entry, electronic health record documentation, patient education     Frann Rider, [facility]  [facility] [address] [place],  [address]  Phone [phone] Fax [phone] Note: This document was prepared with digital dictation and possible smart phrase  technology. Any transcriptional errors that result from this process are unintentional.

## 2019-06-21 NOTE — Patient Instructions (Signed)
Continue aspirin 81 mg daily  and pravastatin  for secondary stroke prevention  Continue to follow up with PCP regarding cholesterol and blood pressure management   Do memory exercises such as suduko, cross word puzzles, word search, card games and reading to help with memory concerns  Continue to monitor blood pressure at home  Maintain strict control of hypertension with blood pressure goal below 130/90, diabetes with hemoglobin A1c goal below 6.5% and cholesterol with LDL cholesterol (bad cholesterol) goal below 70 mg/dL. I also advised the patient to eat a healthy diet with plenty of whole grains, cereals, fruits and vegetables, exercise regularly and maintain ideal body weight.  Followup in the future with me in 6 months or call earlier if needed       Thank you for coming to see [place] at [facility]. I hope we have been able to provide you high quality care today.  You may receive a patient satisfaction survey over the next few weeks. We would appreciate your feedback and comments so that we may continue to improve ourselves and the health of our patients.

## 2019-06-22 LAB — BASIC METABOLIC PANEL
BUN/Creatinine Ratio: 11 (ref 10–24)
BUN: 13 mg/dL (ref 8–27)
CO2: 24 mmol/L (ref 20–29)
Calcium: 9.8 mg/dL (ref 8.6–10.2)
Chloride: 106 mmol/L (ref 96–106)
Creatinine, Ser: 1.15 mg/dL (ref 0.76–1.27)
GFR calc Af Amer: 74 mL/min/{1.73_m2} (ref >59)
GFR calc non Af Amer: 64 mL/min/{1.73_m2} (ref >59)
Glucose: 81 mg/dL (ref 65–99)
Potassium: 4.5 mmol/L (ref 3.5–5.2)
Sodium: 144 mmol/L (ref 134–144)

## 2019-07-30 ENCOUNTER — Telehealth: Payer: Self-pay | Admitting: Adult Health

## 2019-07-30 NOTE — Telephone Encounter (Signed)
There is a letter under letter section from Dr. Naaman Plummer , is this what she would like , it details a lot of what she is saying .

## 2019-07-30 NOTE — Telephone Encounter (Signed)
Spoke with pt's wife Jeremiah [name] (dpr on file), states she is requesting another copy of a letter detailing pt's current health conditions and why it's so important for him to remain on his current medication regimens, how he is not improving and is expected to continue declining, how he needs help around the home, and more.  I do not see a letter in pt's chart outlining this information.  Wife wasn't able to tell me who she spoke with or when she received this letter, but states it was from our office and she needs another copy to discuss pt's care further with the [place].  I advised that we could type another letter and send it to her, but she seems concerned that the letter will not cover all of the information she needs it to cover.  Pt's wife then asked to speak to TP directly.    Wife Jeremiah [name]'s callback #: [phone].  TP please advise.  Thanks!

## 2019-07-30 NOTE — Telephone Encounter (Signed)
That is fine to do a letter  In letter you can say patient is followed for chronic severe COPD that is oxygen dependent he has multiple comorbidities that impede his daily quality of life.  He continues to have a slow progressive decline.  He requires assistance with activities of daily living.  Due to his underlying chronic dementia now requires assistance 24/7 and cannot be left at home without assistance with basic care.  Patient Active Problem List   Diagnosis Date Noted  . Cellulitis of right leg 03/17/2019  . Chronic respiratory failure with hypoxia ([place]) 11/25/2018  . ICH (intracerebral hemorrhage) ([place]) 09/21/2018  . Delirium due to another medical condition, acute, hyperactive 09/14/2018  . Dementia ([place]) 09/14/2018  . Acute combined systolic and diastolic heart failure ([place]) 09/07/2018  . Acute renal failure with acute cortical necrosis ([place]) 09/07/2018  . Pulmonary embolism ([place])   . Intracerebral hemorrhage 08/31/2018  . Chronic rhinitis 03/02/2018  . Chronic cough 01/22/2017  . Constipation 01/01/2016  . Acute URI 03/13/2015  . Medicare annual wellness visit, subsequent 12/28/2014  . Essential hypertension 12/12/2014  . Generalized anxiety disorder 11/02/2014  . OSA (obstructive sleep apnea) 04/26/2014  . COPD, frequent exacerbations ([place]) 04/25/2013   Current Outpatient Medications on File Prior to Visit  Medication Sig Dispense Refill  . acetaminophen (TYLENOL) 325 MG tablet Take 2 tablets (650 mg total) by mouth every 6 (six) hours as needed for mild pain.    [name] Kitchen albuterol (VENTOLIN HFA) 108 (90 Base) MCG/ACT inhaler Inhale 2 puffs into the lungs every 4 (four) hours as needed for wheezing or shortness of breath (((PLAN B))). 8 g 5  . aspirin EC 81 MG tablet Take 81 mg by mouth daily.    . carbamazepine (TEGRETOL) 200 MG tablet Take 200 mg by mouth 2 (two) times daily.    [name] Kitchen esomeprazole (NEXIUM) 40 MG capsule Take 1 capsule (40 mg total) by mouth daily before breakfast.  30 capsule 2  . finasteride (PROSCAR) 5 MG tablet Take by mouth.    . Fluticasone-Umeclidin-Vilant (TRELEGY ELLIPTA) 100-62.5-25 MCG/INH AEPB Inhale 1 puff into the lungs daily. 1 each 5  . furosemide (LASIX) 20 MG tablet Take 1 tablet (20 mg total) by mouth daily. 30 tablet 0  . isosorbide-hydrALAZINE (BIDIL) 20-37.5 MG tablet Take 1 tablet by mouth 3 (three) times daily. 90 tablet 0  . Multiple Vitamin (MULTIVITAMIN WITH MINERALS) TABS tablet Take 1 tablet by mouth daily. 30 tablet 0  . oxybutynin (DITROPAN) 5 MG tablet Take 5 mg by mouth 3 (three) times daily.    . pravastatin (PRAVACHOL) 10 MG tablet Take 1 tablet (10 mg total) by mouth daily. 30 tablet 0  . QUEtiapine (SEROQUEL) 50 MG tablet Take 1 tablet (50 mg total) by mouth at bedtime. 30 tablet 0  . triamcinolone ointment (KENALOG) 0.1 % Apply topically 2 (two) times daily.     No current facility-administered medications on file prior to visit.    Thanks

## 2019-07-30 NOTE — Telephone Encounter (Signed)
Spoke with pt's wife, aware of TP's recs.  Letter typed and sent via MyChart at wife's request.  Nothing further needed at this time- will close encounter.

## 2019-07-30 NOTE — Telephone Encounter (Signed)
Spoke with Jeremiah [name], she states her advocate told her that the more letters she has from different providers the better the chance of him getting the approval. She states TP wrote her a great letter last time but she lost it and I don't see a letter in Epic from TP. Please advise.

## 2019-12-27 ENCOUNTER — Ambulatory Visit: Payer: Medicare Other | Admitting: Adult Health

## 2020-02-01 ENCOUNTER — Other Ambulatory Visit: Payer: Self-pay

## 2020-02-01 ENCOUNTER — Encounter: Payer: Self-pay | Admitting: Adult Health

## 2020-02-01 ENCOUNTER — Ambulatory Visit (INDEPENDENT_AMBULATORY_CARE_PROVIDER_SITE_OTHER): Payer: Medicare Other | Admitting: Adult Health

## 2020-02-01 VITALS — BP 139/74 | HR 65 | Ht 74.0 in | Wt 162.2 lb

## 2020-02-01 DIAGNOSIS — E785 Hyperlipidemia, unspecified: Secondary | ICD-10-CM

## 2020-02-01 DIAGNOSIS — I639 Cerebral infarction, unspecified: Secondary | ICD-10-CM | POA: Diagnosis not present

## 2020-02-01 DIAGNOSIS — F419 Anxiety disorder, unspecified: Secondary | ICD-10-CM

## 2020-02-01 DIAGNOSIS — I10 Essential (primary) hypertension: Secondary | ICD-10-CM

## 2020-02-01 DIAGNOSIS — I610 Nontraumatic intracerebral hemorrhage in hemisphere, subcortical: Secondary | ICD-10-CM | POA: Diagnosis not present

## 2020-02-01 DIAGNOSIS — F32A Depression, unspecified: Secondary | ICD-10-CM

## 2020-02-01 NOTE — Progress Notes (Signed)
[facility] [address] [place] [address] [phone]       STROKE FOLLOW UP NOTE  [name] Date of Birth:  1946-03-19 Medical Record Number:  [id number]   Reason for Referral: stroke follow up    CHIEF COMPLAINT:  Chief Complaint  Patient presents with  . Follow-up    6 month f/u, states he has been doing well since last visit  . room 9    with wife    HPI:   Today, 02/01/2020, Jeremiah [name] returns for stroke follow-up accompanied by his wife.  Stable from stroke standpoint with residual cognitive impairment which has been relatively stable since prior visit.  He continues to experience good days and bad days and at times will need more assistance with ADLs and IADLs.  Denies new stroke/TIA symptoms.  Remains on aspirin 81 mg daily and pravastatin 10 mg daily for secondary stroke prevention without side effects.  Blood pressure today 139/74.  Currently on carbamazepine and Seroquel for major depression disorder managed by [facility] psychiatry. Wife does report over the past month he has been reporting increased "fear" but patient unable to further elaborate his symptoms of fearfullness or cause.  She also reports increased anxiety during certain situations such as riding in the car and is wondering if a medication such as Xanax will be beneficial.  No further concerns at this time.    History provided for reference purposes only Update 06/21/2019 JM: Jeremiah [name] is a 74 year old male who is being seen today, 06/21/2019, for follow-up regarding prior stroke and cognitive impairment accompanied by his wife.  Since prior visit, memory loss/cognition has been stable with MMSE today 23/30. Wife does report wax/waning of symptoms but overall stable.  Continues on aspirin 81 mg daily and pravastatin for secondary stroke prevention without side effects.  Blood pressure today 141/78.  Will occasionally monitor at home and typically SBP 120s but can fluctuate.  He does  report pulsatile tinnitus in the ears bilaterally which has been present "for some time" without changes.  No concerns at this time.  Initial visit 11/18/2018 JM: He has been stable from a neurological standpoint since discharge.  Wife is unsure of mental status worsening as it will wax/wane daily.  He was recently evaluated prior to hospitalization for concerns of memory loss but fortunately did not have follow-up with provider due to being hospitalized.  Denies behavioral concerns.  He continues to work with physical therapy due to his gait and overall strengthening.  He is able to ambulate with rolling walker at home but currently in wheelchair due to distance.  Wife plans on working with [facility] in order to obtain additional speech therapy.  He continues on aspirin 81 mg without bleeding or bruising.  Continues on pravastatin without myalgias.  Blood pressure today 148/86 which is slightly elevated as home readings typically SBP 110-120s.  No further concerns at this time.  Denies new or worsening stroke/TIA symptoms.  Stroke admission 08/31/2018: Jeremiah [name] is a 74 y.o. male with history of dementia, COPD, HTN, OSA admitted for SOB.  MRI showed left frontal punctate infarcts and tiny ICH with hemosiderosis consistent with CAA.  Vessel imaging unremarkable.  Lower extremity venous Dopplers negative for DVT.  2D echo EF of 20-25%.  LDL 83 and A1c 5.6.  Fluctuation of blood pressures during admission with underlying history of HTN.  Recommended continuation of pravastatin 10 mg daily for HLD management.  CT chest showed small LLL segmental PE  but unable to initiate AC due to CAA.  Consider initiating aspirin 81 mg if repeat CT head stable.  EEG performed which showed encephalopathic state but no seizure activity.  He was discharged to CIR for ongoing deficits and then discharged home in stable condition with recommendation of 24/7 supervision and home health PT/OT/SLP and nursing.     ROS:   14 system  review of systems performed and negative with exception of memory loss, depression and anxiety  PMH:  Past Medical History:  Diagnosis Date  . COPD (chronic obstructive pulmonary disease) ([place])   . Depression   . Dilated cardiomyopathy ([place])   . Hypertension   . PTSD (post-traumatic stress disorder)   . Sleep apnea     PSH:  Past Surgical History:  Procedure Laterality Date  . HAMMER TOE SURGERY  April 2017    Social History:  Social History   Socioeconomic History  . Marital status: Married    Spouse name: Not on file  . Number of children: 2  . Years of education: 16  . Highest education level: Not on file  Occupational History  . Occupation: Retired Armed forces technical officer  . Smoking status: Former Smoker    Packs/day: 1.00    Years: 30.00    Pack years: 30.00    Types: Cigarettes    Quit date: 04/16/1987    Years since quitting: 32.8  . Smokeless tobacco: Never Used  Substance and Sexual Activity  . Alcohol use: Yes    Alcohol/week: 2.0 standard drinks    Types: 2 Glasses of wine per week  . Drug use: No  . Sexual activity: Not on file  Other Topics Concern  . Not on file  Social History Narrative   Fun: Live   Denies religious beliefs effecting health care.   Feels safe at home and denies abuse.    Social Determinants of Health   Financial Resource Strain:   . Difficulty of Paying Living Expenses: Not on file  Food Insecurity:   . Worried About Charity fundraiser in the Last Year: Not on file  . Ran Out of Food in the Last Year: Not on file  Transportation Needs:   . Lack of Transportation (Medical): Not on file  . Lack of Transportation (Non-Medical): Not on file  Physical Activity:   . Days of Exercise per Week: Not on file  . Minutes of Exercise per Session: Not on file  Stress:   . Feeling of Stress : Not on file  Social Connections:   . Frequency of Communication with Friends and Family: Not on file  . Frequency of Social Gatherings  with Friends and Family: Not on file  . Attends Religious Services: Not on file  . Active Member of Clubs or Organizations: Not on file  . Attends Archivist Meetings: Not on file  . Marital Status: Not on file  Intimate Partner Violence:   . Fear of Current or Ex-Partner: Not on file  . Emotionally Abused: Not on file  . Physically Abused: Not on file  . Sexually Abused: Not on file    Family History:  Family History  Problem Relation Age of Onset  . Asthma Maternal Grandmother   . Heart disease Father   . Dementia Mother     Medications:   Current Outpatient Medications on File Prior to Visit  Medication Sig Dispense Refill  . acetaminophen (TYLENOL) 325 MG tablet Take 2 tablets (650 mg total) by  mouth every 6 (six) hours as needed for mild pain.    [name] Kitchen albuterol (VENTOLIN HFA) 108 (90 Base) MCG/ACT inhaler Inhale 2 puffs into the lungs every 4 (four) hours as needed for wheezing or shortness of breath (((PLAN B))). 8 g 5  . aspirin EC 81 MG tablet Take 81 mg by mouth daily.    . carbamazepine (TEGRETOL) 200 MG tablet Take 200 mg by mouth 2 (two) times daily.    [name] Kitchen esomeprazole (NEXIUM) 40 MG capsule Take 1 capsule (40 mg total) by mouth daily before breakfast. 30 capsule 2  . Fluticasone-Umeclidin-Vilant (TRELEGY ELLIPTA) 100-62.5-25 MCG/INH AEPB Inhale 1 puff into the lungs daily. 1 each 5  . furosemide (LASIX) 20 MG tablet Take 1 tablet (20 mg total) by mouth daily. 30 tablet 0  . isosorbide-hydrALAZINE (BIDIL) 20-37.5 MG tablet Take 1 tablet by mouth 3 (three) times daily. 90 tablet 0  . Multiple Vitamin (MULTIVITAMIN WITH MINERALS) TABS tablet Take 1 tablet by mouth daily. 30 tablet 0  . oxybutynin (DITROPAN) 5 MG tablet Take 5 mg by mouth 3 (three) times daily.    . pravastatin (PRAVACHOL) 10 MG tablet Take 1 tablet (10 mg total) by mouth daily. 30 tablet 0  . QUEtiapine (SEROQUEL) 50 MG tablet Take 1 tablet (50 mg total) by mouth at bedtime. 30 tablet 0  .  triamcinolone ointment (KENALOG) 0.1 % Apply topically 2 (two) times daily.     No current facility-administered medications on file prior to visit.    Allergies:  No Known Allergies   Physical Exam  Vitals:   02/01/20 0723  BP: 139/74  Pulse: 65  Weight: 162 lb 3.2 oz (73.6 kg)  Height: 6\' 2"  (1.88 m)   Body mass index is 20.83 kg/m. No exam data present  General: well developed, well nourished, pleasant elderly African-American male, seated, in no evident distress Head: head normocephalic and atraumatic.   Neck: supple with no carotid or supraclavicular bruits Cardiovascular: regular rate and rhythm, no murmurs Musculoskeletal: no deformity Skin:  no rash/petichiae Vascular:  Normal pulses all extremities   Neurologic Exam Mental Status: Awake and fully alert.   Fluent speech and language.  Oriented to place and time.  Recent and remote memory mildly diminished. Attention span, concentration and fund of knowledge mostly appropriate during visit with wife providing majority of history.  Mood and affect appropriate.  MMSE not completed Cranial Nerves: Pupils equal, briskly reactive to light. Extraocular movements full without nystagmus. Visual fields full to confrontation. Hearing intact. Facial sensation intact. Face, tongue, palate moves normally and symmetrically.  Motor: Normal bulk and tone. Normal strength in all tested extremity muscles. Sensory.: intact to touch , pinprick , position and vibratory sensation.  Coordination: Rapid alternating movements normal in all extremities. Finger-to-nose and heel-to-shin performed accurately bilaterally.  Gait and Station: Stands from seated position without difficulty.  Stance is normal.  Gait demonstrates normal stride length and balance without use of assistive device Reflexes: 1+ and symmetric. Toes downgoing.        ASSESSMENT: Jeremiah [name] is a 74 y.o. year old male here with left frontal punctate infarcts and  micro-bleeds as well as left hemisphere ICH with hemosiderosis consistent with CAA on 08/31/2018. Vascular risk factors include HTN, HLD, OSA, MCI, cardiomyopathy.      PLAN:  1. L frontal stroke:  a. Residual cognitive impairment which has been relatively stable.  Discussed importance of managing stroke risk factors, routine physical exercise and activity, healthy diet and adequate  sleep as well as memory exercises to help prevent/slow worsening.  MMSE not completed as he has been stable.  We will repeat at follow-up visit b. continue aspirin 81 mg daily  and pravastatin for secondary stroke prevention.  c. Discussed secondary stroke prevention measures and importance of close PCP follow-up for aggressive stroke risk factor management 2. L ICH with hemosiderosis consistent with CAA: Stable without new or worsening stroke/TIA symptoms.  Discussed importance of managing stroke risk factors as well as importance of adequate blood pressure control. 3. HTN: BP goal<130/90.  Stable today.  Bidil, and furosemide per PCP 4. HLD: LDL goal<70.  On pravastatin per PCP.  Ensure repeat lipid panel obtained at next follow-up visit 5. OSA: per wife, no longer needs CPAP for treatment.  Continues to be monitored/managed by [place]. Denies excessive daytime fatigue, insomnia, nocturia, snoring or morning headaches 6. Anxiety, depression: Currently managed by psychiatry on carbamazepine and Seroquel.  Question worsening anxiety with intermittent "fearfulness" and situational anxiety.  Okay from a stroke standpoint to use low-dose benzodiazepines for occasional increased anxiety and advised to further discuss with current psychiatrist.   Follow-up in 1 year or call earlier if needed   I spent 30 minutes of face-to-face and non-face-to-face time with patient and wife.  This included previsit chart review, lab review, study review, order entry, electronic health record documentation, patient and wife education and  discussion regarding prior stroke with residual deficits, importance of managing stroke risk factors, anxiety and depression and answered all other questions to patient satisfaction   Frann Rider, [facility]  [facility] [address] [place], [place] [address]  Phone [phone] Fax [phone] Note: This document was prepared with digital dictation and possible smart phrase technology. Any transcriptional errors that result from this process are unintentional.

## 2020-02-01 NOTE — Patient Instructions (Signed)
Continue aspirin 81 mg daily  and pravastatin 10 mg daily for secondary stroke prevention  Continue to follow up with PCP regarding cholesterol and blood pressure management  Maintain strict control of hypertension with blood pressure goal below 130/90 and cholesterol with LDL cholesterol (bad cholesterol) goal below 70 mg/dL.    Follow-up in 1 year or call earlier if needed       Thank you for coming to see [place] at [facility]. I hope we have been able to provide you high quality care today.  You may receive a patient satisfaction survey over the next few weeks. We would appreciate your feedback and comments so that we may continue to improve ourselves and the health of our patients.

## 2020-02-04 NOTE — Progress Notes (Signed)
I agree with the above plan 

## 2020-02-12 ENCOUNTER — Ambulatory Visit: Payer: Medicare Other | Attending: Internal Medicine

## 2020-02-12 DIAGNOSIS — Z23 Encounter for immunization: Secondary | ICD-10-CM

## 2020-02-12 NOTE — Progress Notes (Signed)
   Covid-19 Vaccination Clinic  Name:  Jeremiah [name]    MRN: [id number] DOB: 29-Apr-1945  02/12/2020  [name] was observed post Covid-19 immunization for 15 minutes without incident. He was provided with Vaccine Information Sheet and instruction to access the V-Safe system.   [name] was instructed to call 911 with any severe reactions post vaccine: [name] Kitchen Difficulty breathing  . Swelling of face and throat  . A fast heartbeat  . A bad rash all over body  . Dizziness and weakness

## 2020-04-27 DIAGNOSIS — F331 Major depressive disorder, recurrent, moderate: Secondary | ICD-10-CM | POA: Diagnosis not present

## 2020-04-27 DIAGNOSIS — F4312 Post-traumatic stress disorder, chronic: Secondary | ICD-10-CM | POA: Diagnosis not present

## 2020-04-27 DIAGNOSIS — F411 Generalized anxiety disorder: Secondary | ICD-10-CM | POA: Diagnosis not present

## 2020-04-30 IMAGING — MR MRI HEAD WITHOUT CONTRAST
12 of 14 series · 34 of 48 positions shown · non-contrast
Comparison: 08/31/2018 CT of the head.

CLINICAL DATA: 72 y/o  M; left frontal lobe cerebral hemorrhage.

EXAM:
MRI HEAD WITHOUT CONTRAST
MRA HEAD WITHOUT CONTRAST
TECHNIQUE: Multiplanar, multiecho pulse sequences of the brain and surrounding
structures were obtained without intravenous contrast. Angiographic
images of the head were obtained using MRA technique without
contrast.

[Series 5: DWI · axial · 3.0mm · 0.88mm/px · z∈[-59,+85]mm · 6 of 98 slices shown (1 of 4)]
[im 1/98]
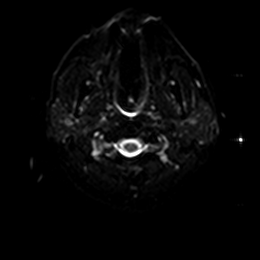
[im 20/98]
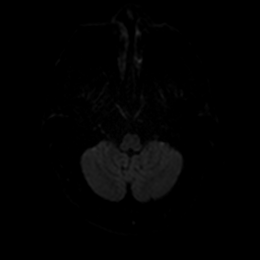
[im 39/98]
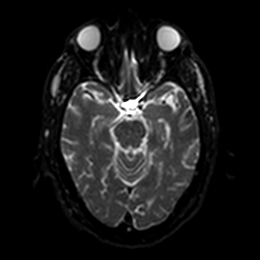
[im 59/98]
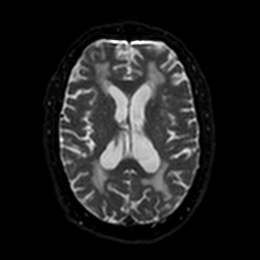
[im 78/98]
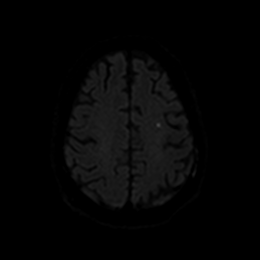
[im 98/98]
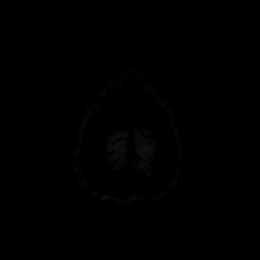

[Series 6: DWI · axial · 3.0mm · 0.88mm/px · z∈[-59,+85]mm · 3 of 49 slices shown (2 of 4)]
[im 1/49]
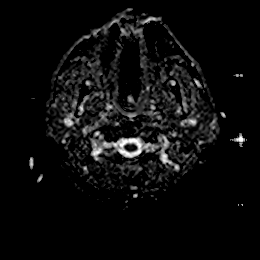
[im 25/49]
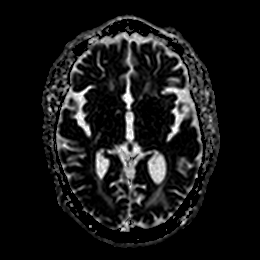
[im 49/49]
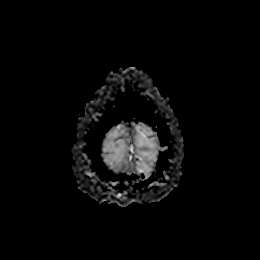

[Series 11: DWI · coronal · 4.0mm · 0.88mm/px · 4 of 70 slices shown (3 of 4)]
[im 1/70]
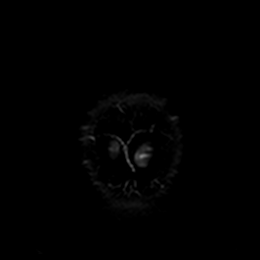
[im 24/70]
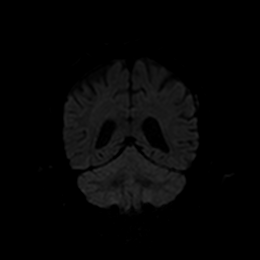
[im 47/70]
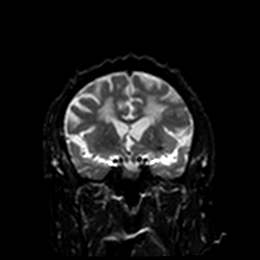
[im 70/70]
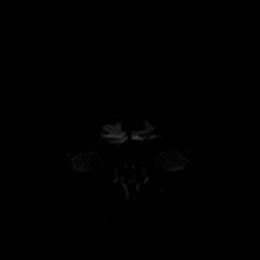

[Series 12: DWI · coronal · 4.0mm · 0.88mm/px · 2 of 35 slices shown (4 of 4)]
[im 1/35]
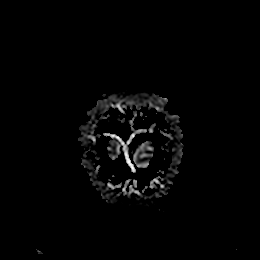
[im 35/35]
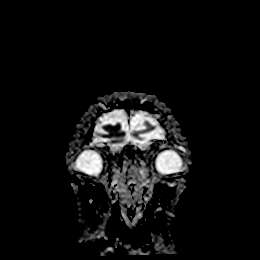

[Series 13: T1 · sagittal · 5.0mm · 0.75mm/px · 1 of 23 slices shown]
[im 1/23]
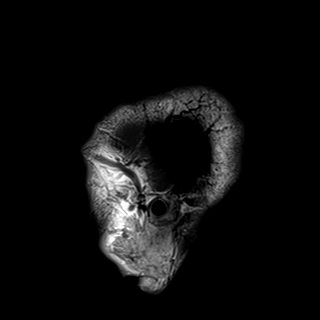

[Series 14: T2 · axial · 5.0mm · 0.72mm/px · 1 of 25 slices shown (1 of 2)]
[im 1/25]
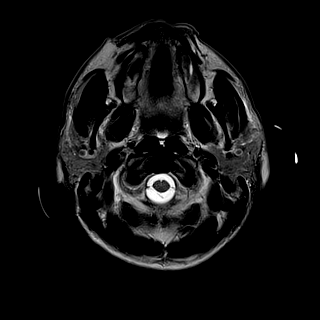

[Series 15: FLAIR · axial · 5.0mm · 0.45mm/px · 1 of 25 slices shown]
[im 1/25]
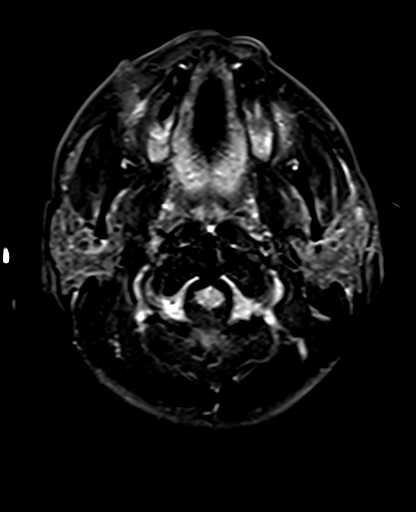

[Series 16: mag_images · axial · 3.0mm · 0.90mm/px · z∈[-66,+110]mm · 4 of 60 slices shown]
[im 1/60]
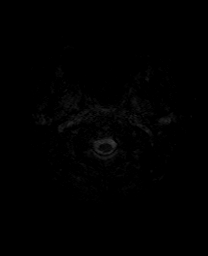
[im 20/60]
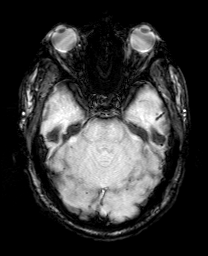
[im 40/60]
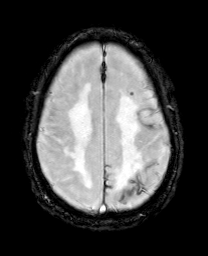
[im 60/60]
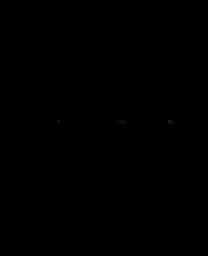

[Series 17: pha_images · axial · 3.0mm · 0.90mm/px · z∈[-66,+110]mm · 3 of 58 slices shown]
[im 1/58]
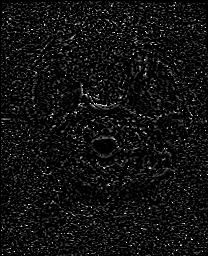
[im 29/58]
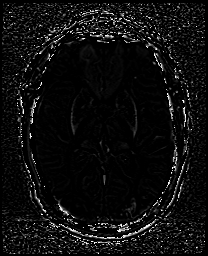
[im 58/58]
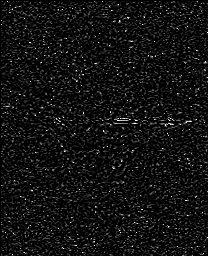

[Series 18: swi_images · axial · 3.0mm · 0.90mm/px · z∈[-66,+110]mm · 4 of 60 slices shown]
[im 1/60]
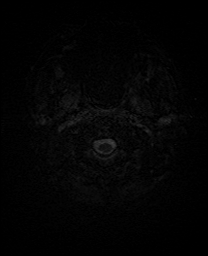
[im 20/60]
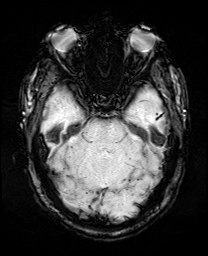
[im 40/60]
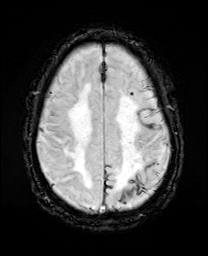
[im 60/60]
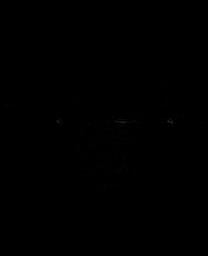

[Series 19: mip_images(sw) · axial · 24.0mm · 0.90mm/px · z∈[-56,+100]mm · 3 of 53 slices shown]
[im 1/53]
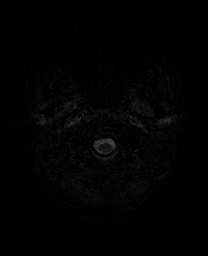
[im 27/53]
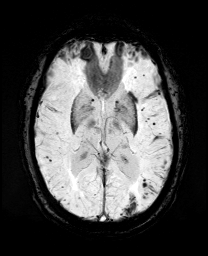
[im 53/53]
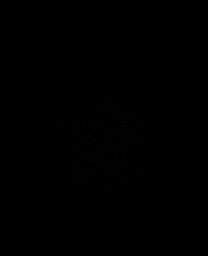

[Series 21: T2 · coronal · 5.0mm · 0.34mm/px · 2 of 29 slices shown (2 of 2)]
[im 1/29]
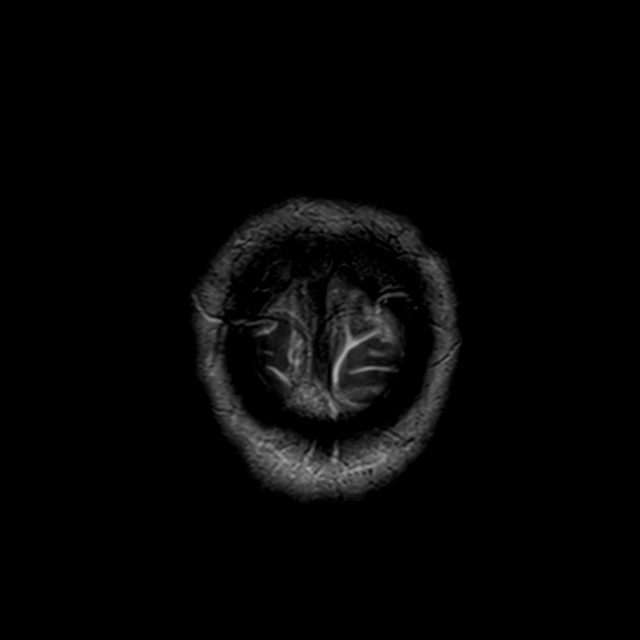
[im 29/29]
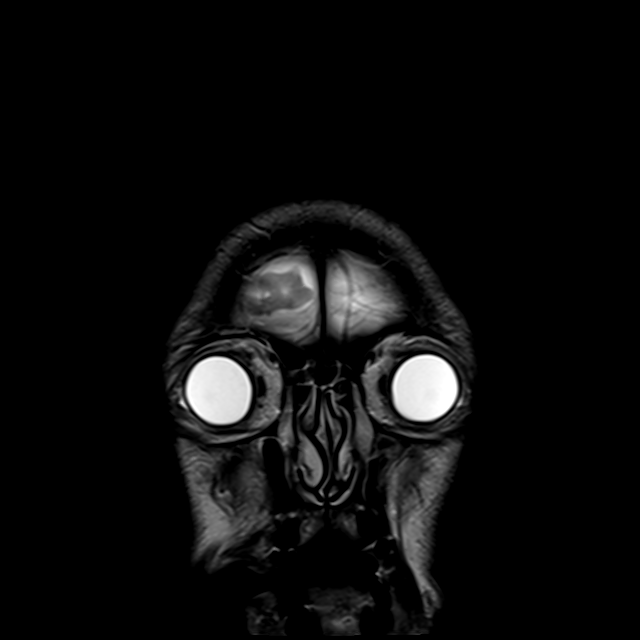

[34 of 48 positions shown; findings below may reference images not displayed]

FINDINGS: MRI HEAD FINDINGS

Brain: 5 mm focus of acute hemorrhage within the left frontal lobe
is associated with a small linear focus of subcortical reduced
diffusion compatible with acute/early subacute infarction (series 5,
image 90). Additional punctate foci of reduced diffusion are present
in the left anterior frontal lobe and the left temporal lobe without
associated hemorrhage.

Large confluent nonspecific T2 FLAIR hyperintensities in subcortical
and periventricular white matter are compatible with advanced
chronic microvascular ischemic changes. Mild-to-moderate volume loss
of the brain. Small chronic infarct is present within the left
cerebellar hemisphere. There are numerous small foci of
susceptibility hypointensity throughout the brain compatible with
hemosiderin deposition of microhemorrhage. Foci of hemorrhage are in
a predominantly peripheral distribution. Additionally, there are
areas of superficial siderosis over the left-greater-than-right
cerebral convexities from prior subarachnoid hemorrhage.

Vascular: As below.

Skull and upper cervical spine: Normal marrow signal.

Sinuses/Orbits: Negative.

Other: None.

MRA HEAD FINDINGS

Anterior circulation: No large vessel occlusion, aneurysm, or
significant stenosis is identified.

Posterior circulation: No large vessel occlusion, aneurysm, or
significant stenosis is identified.

Anatomic variation: Large left A1, small right A1, large anterior
communicating artery, normal variant. Fetal right PCA.
IMPRESSION: MRI head:

1. 5 mm focus of acute hemorrhage in the left frontal lobe is
associated with a small subcortical acute/early subacute infarction.
Additional punctate foci of acute/early subacute infarction are
present in the left anterior frontal lobe and the left temporal
lobe.
2. Numerous foci of chronic hemorrhage throughout the brain in a
predominant peripheral distribution as well as superficial siderosis
over the left-greater-than-right cerebral convexities. The
peripheral distribution favors amyloid angiopathy over chronic
sequelae of hypertension.
3. Severe chronic microvascular ischemic changes and mild to
moderate volume loss of the brain.

MRA head:

Patent anterior and posterior intracranial circulation. No large
vessel occlusion, aneurysm, or significant stenosis is identified.

## 2020-04-30 IMAGING — CT CT HEAD WITHOUT CONTRAST
3 series · 14 of 47 positions shown, 16 images · non-contrast
Comparison: Head CT 08/31/2018 and MRI 09/02/2018

CLINICAL DATA: Follow-up cerebral hemorrhage.

EXAM:
CT HEAD WITHOUT CONTRAST
TECHNIQUE: Contiguous axial images were obtained from the base of the skull
through the vertex without intravenous contrast.

[Series 3: head 5.0 h30s · axial · 0.49mm/px · z∈[-105,+45]mm · 8 of 36 slices shown, 10 images]
[im 3/36  brain]
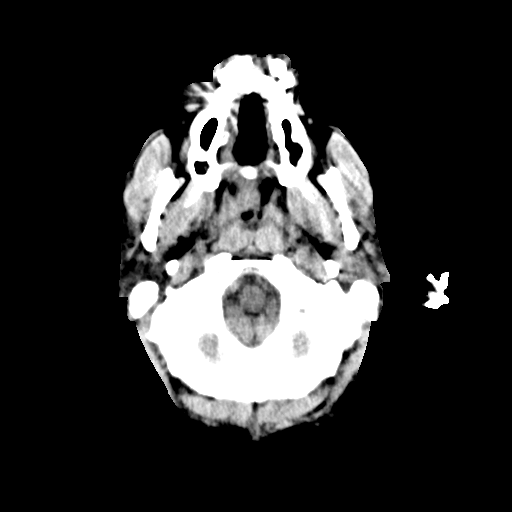
[im 3/36  bone]
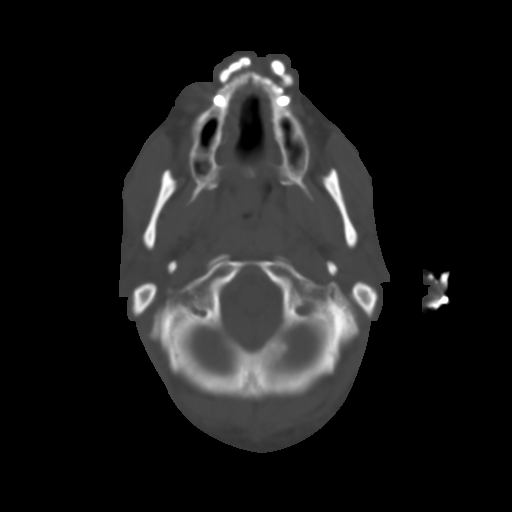
[im 8/36  brain]
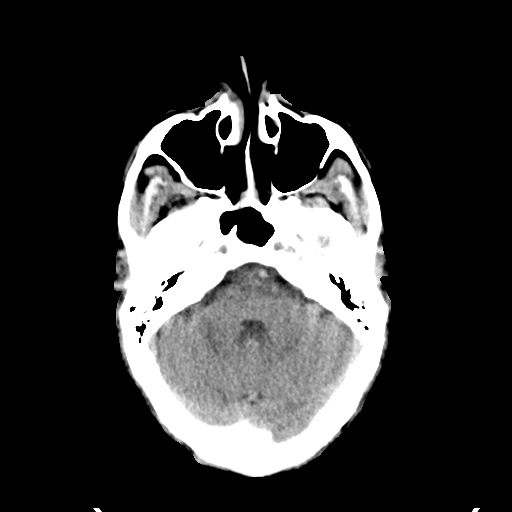
[im 11/36  brain]
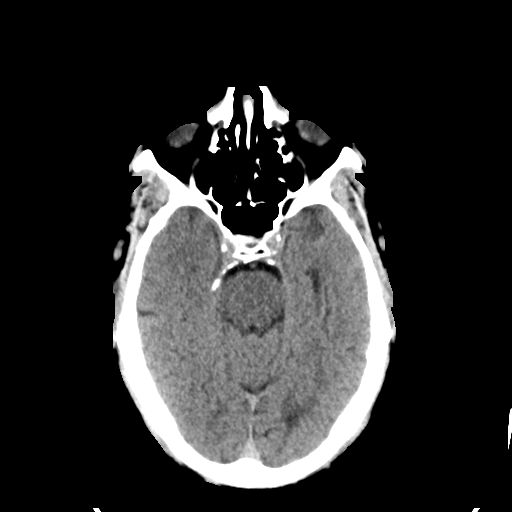
[im 16/36  brain]
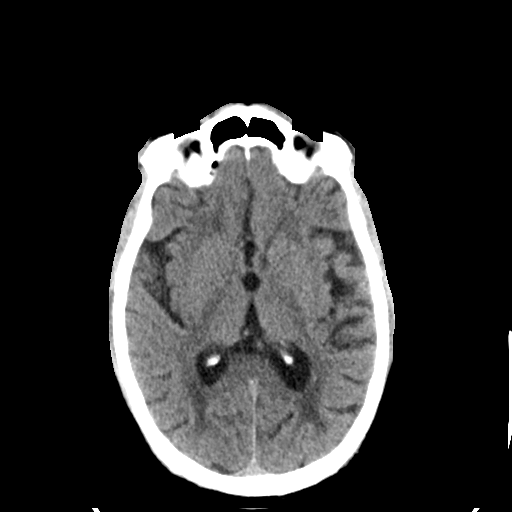
[im 20/36  brain]
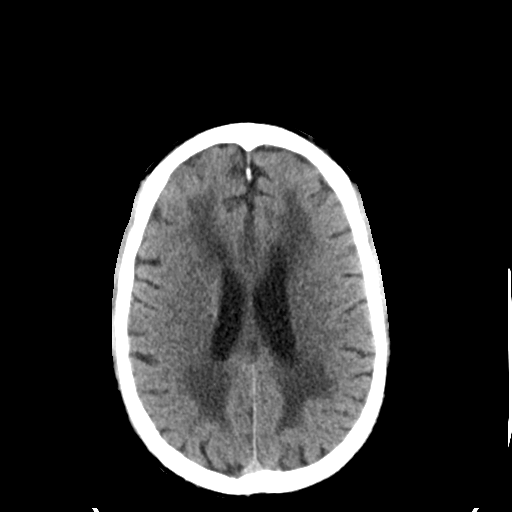
[im 20/36  bone]
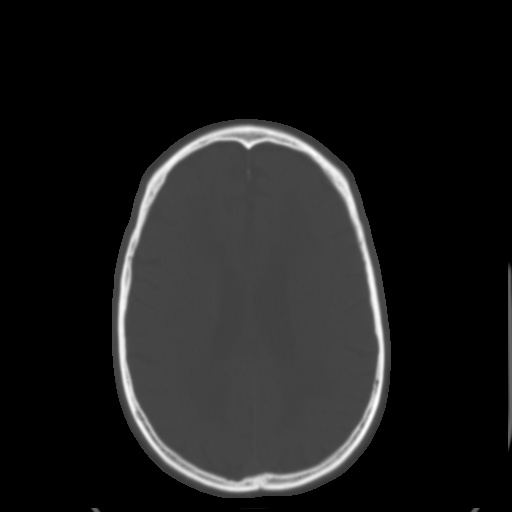
[im 25/36  brain]
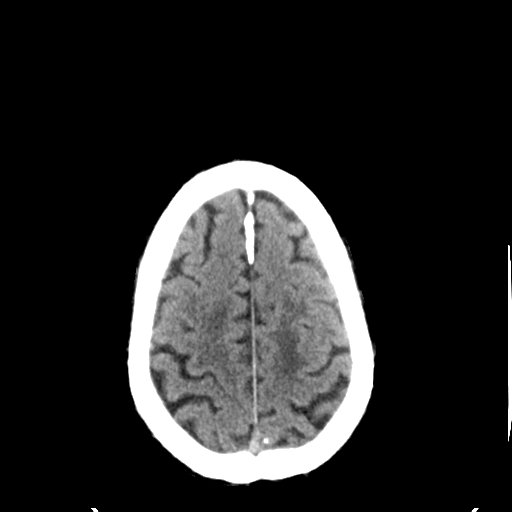
[im 28/36  brain]
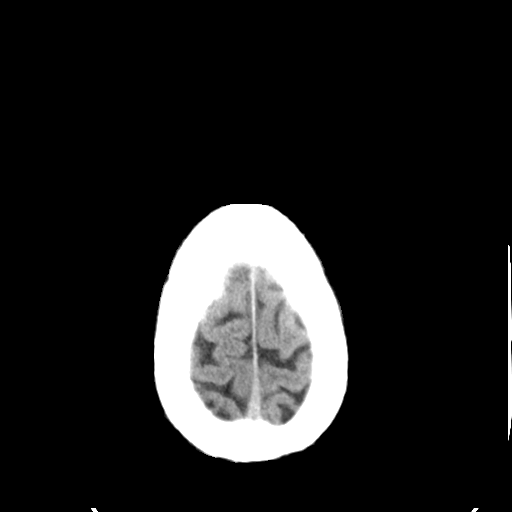
[im 33/36  brain]
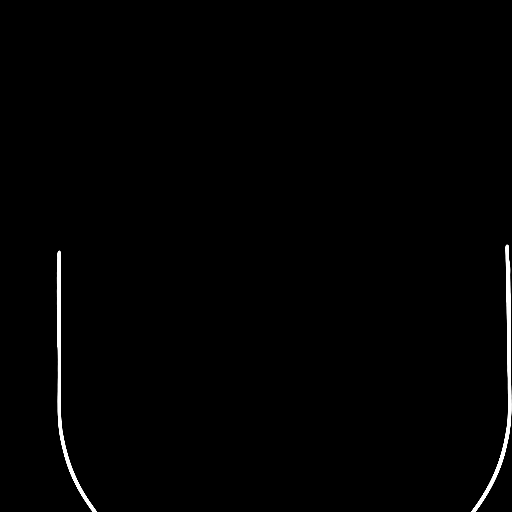

[Series 5: head 3.0 mpr cor · coronal · 0.36mm/px · 3 of 74 slices shown]
[im 25/74  brain]
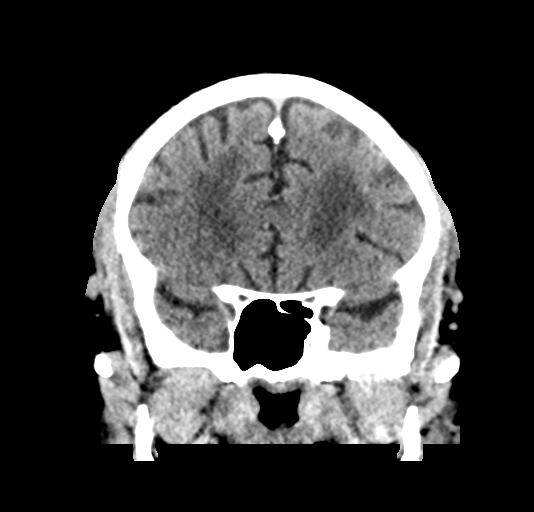
[im 33/74  brain]
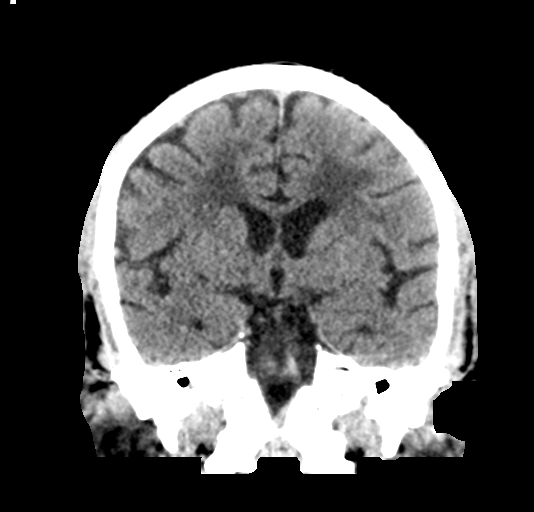
[im 41/74  brain]
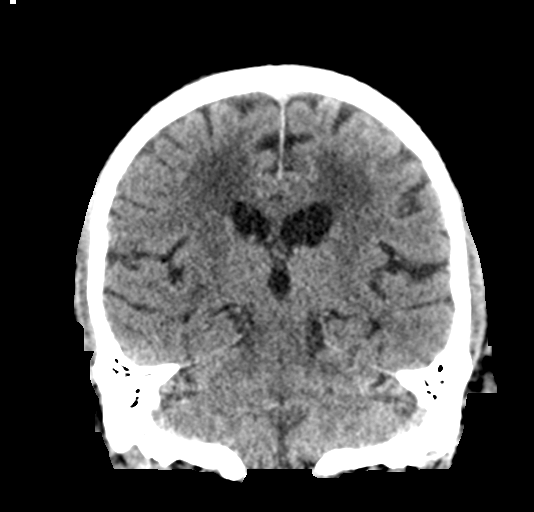

[Series 6: head 3.0 mpr sag · sagittal · 0.35mm/px · 3 of 67 slices shown]
[im 23/67  brain]
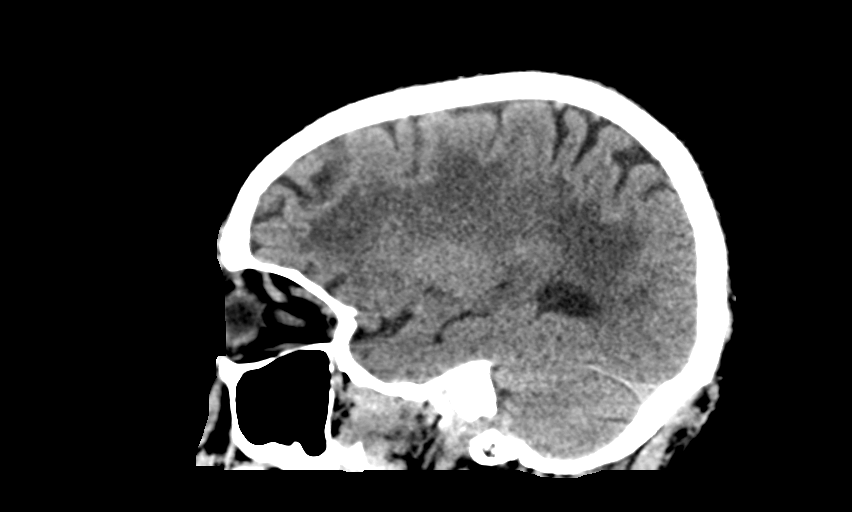
[im 34/67  brain]
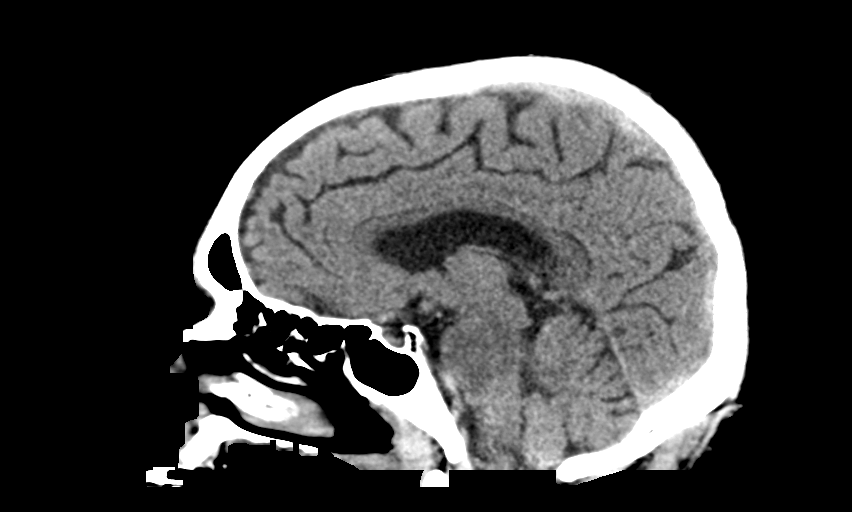
[im 45/67  brain]
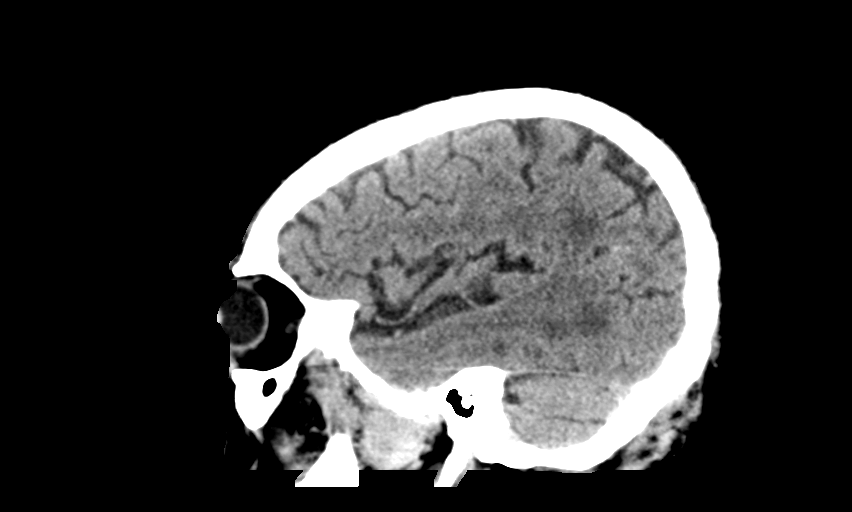

[14 of 47 positions shown; findings below may reference images not displayed]

FINDINGS: Brain: A 5 mm focus of subcortical hemorrhage in the posterior left
frontal lobe is unchanged in size from the prior CT and is without
associated edema. Focal linear hyperattenuation in a left parietal
sulcus is unchanged from the prior CT and may represent a vessel
versus trace subarachnoid hemorrhage. There is no new intracranial
hemorrhage elsewhere. Punctate acute infarcts in the left frontal
and left temporal lobes on today's MRI are not well demonstrated by
CT. Confluent cerebral white matter hypodensities are unchanged and
compatible with severe chronic small vessel ischemic disease. Mild
cerebral atrophy is noted. There is no midline shift or extra-axial
fluid collection.

Vascular: Calcified atherosclerosis at the skull base.

Skull: No fracture or focal osseous lesion.

Sinuses/Orbits: Minimal scattered mucosal thickening in the
paranasal sinuses. Clear mastoid air cells. Unremarkable orbits.

Other: None.
IMPRESSION: 1. Unchanged small hemorrhage in the posterior left frontal lobe.
2. Unchanged focal density in a left parietal sulcus which may be
vascular or indicative of trace subarachnoid hemorrhage.
3. No new intracranial hemorrhage.
4. Severe chronic small vessel ischemic disease.

## 2020-05-03 IMAGING — DX PORTABLE CHEST - 1 VIEW
1 series · 1 of 1 positions shown · non-contrast
Comparison: Chest x-ray 09/01/2018.

CLINICAL DATA: 72-year-old male with history of respiratory
failure. History of COPD.

EXAM:
PORTABLE CHEST 1 VIEW

[chest]
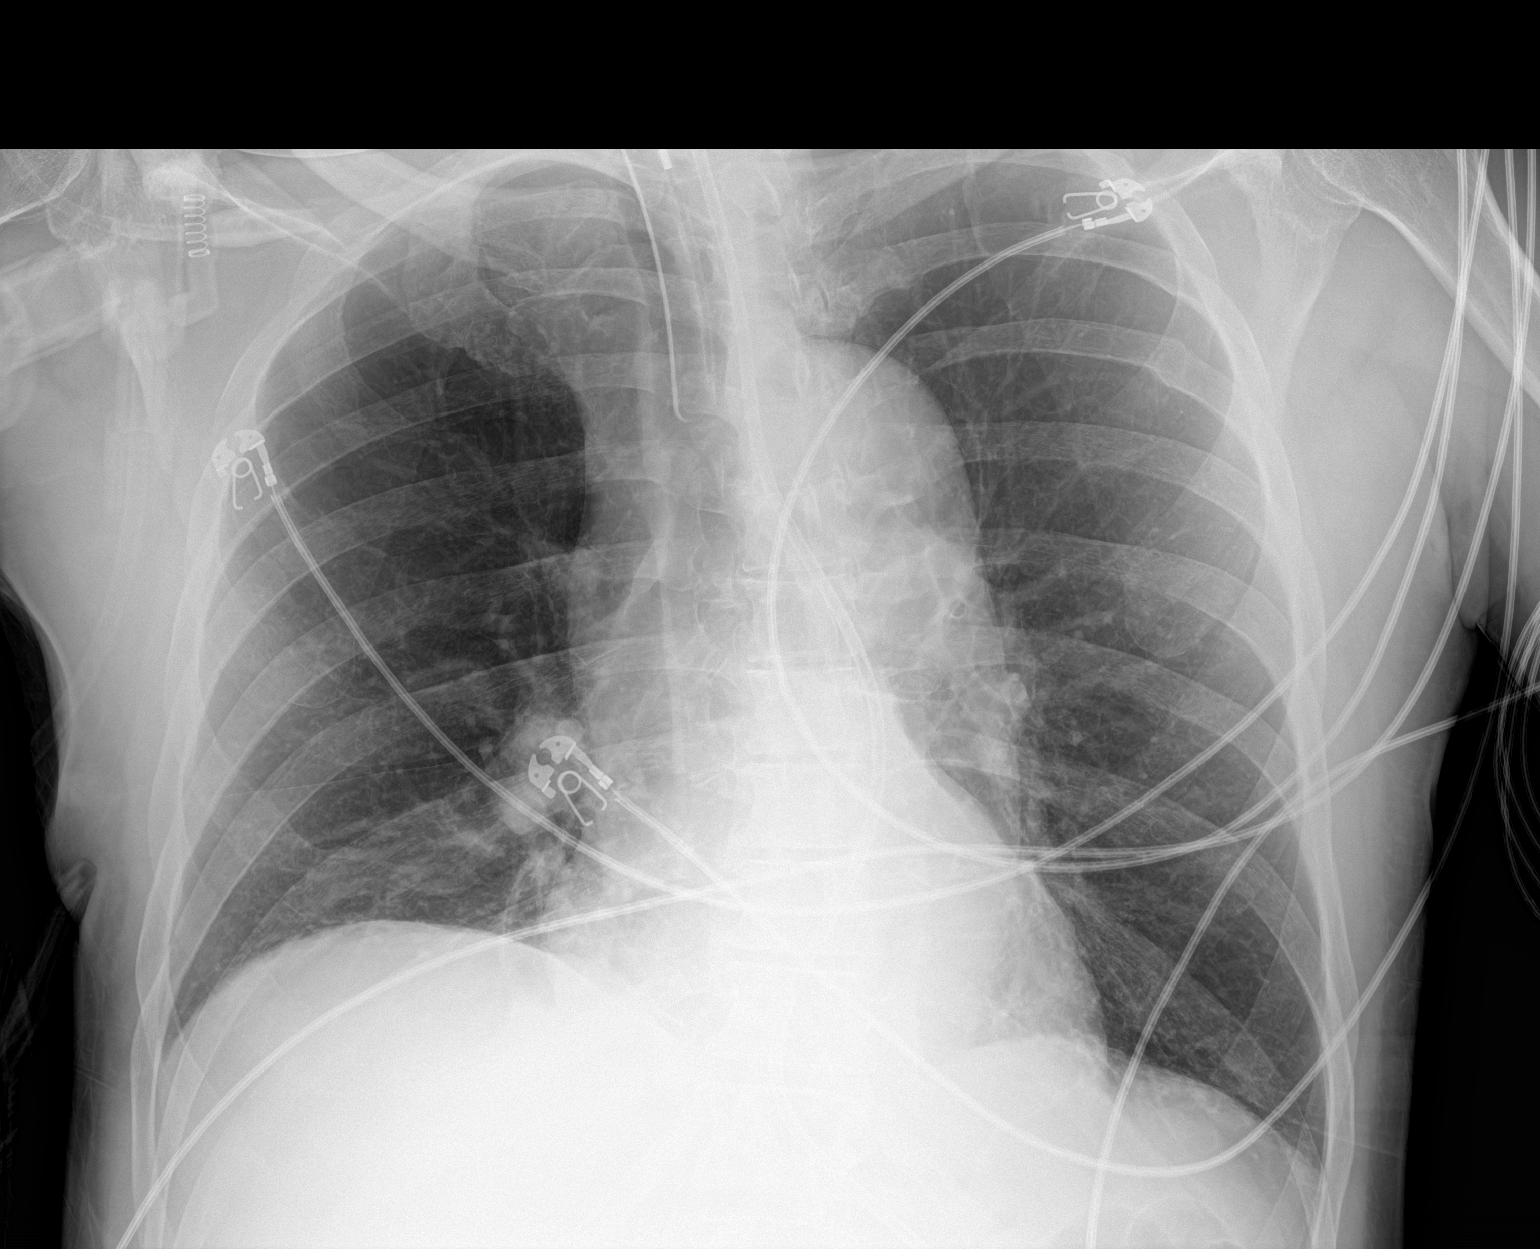

[1 of 1 positions shown; findings below may reference images not displayed]

FINDINGS: An endotracheal tube is in place with tip 4.8 cm above the carina. A
feeding tube is seen extending into the abdomen, however, the tip of
the feeding tube extends below the lower margin of the image. Lung
volumes are slightly low. No consolidative airspace disease. No
pleural effusions. No evidence of pulmonary edema. Heart size is
upper limits of normal. The patient is rotated to the right on
today's exam, resulting in distortion of the mediastinal contours
and reduced diagnostic sensitivity and specificity for mediastinal
pathology. Atherosclerosis in the thoracic aorta.
IMPRESSION: 1. Support apparatus, as above.
2. No radiographic evidence of acute cardiopulmonary disease.
3. Aortic atherosclerosis.

## 2020-05-05 IMAGING — DX PORTABLE CHEST - 1 VIEW
1 series · 2 of 2 positions shown · non-contrast
Comparison: Chest x-ray 09/06/2018.

CLINICAL DATA: 72-year-old male with history of intubation.

EXAM:
PORTABLE CHEST 1 VIEW

[Series 1: chest · 0.14mm/px · 2 of 2 slices shown]
[im 1/2]
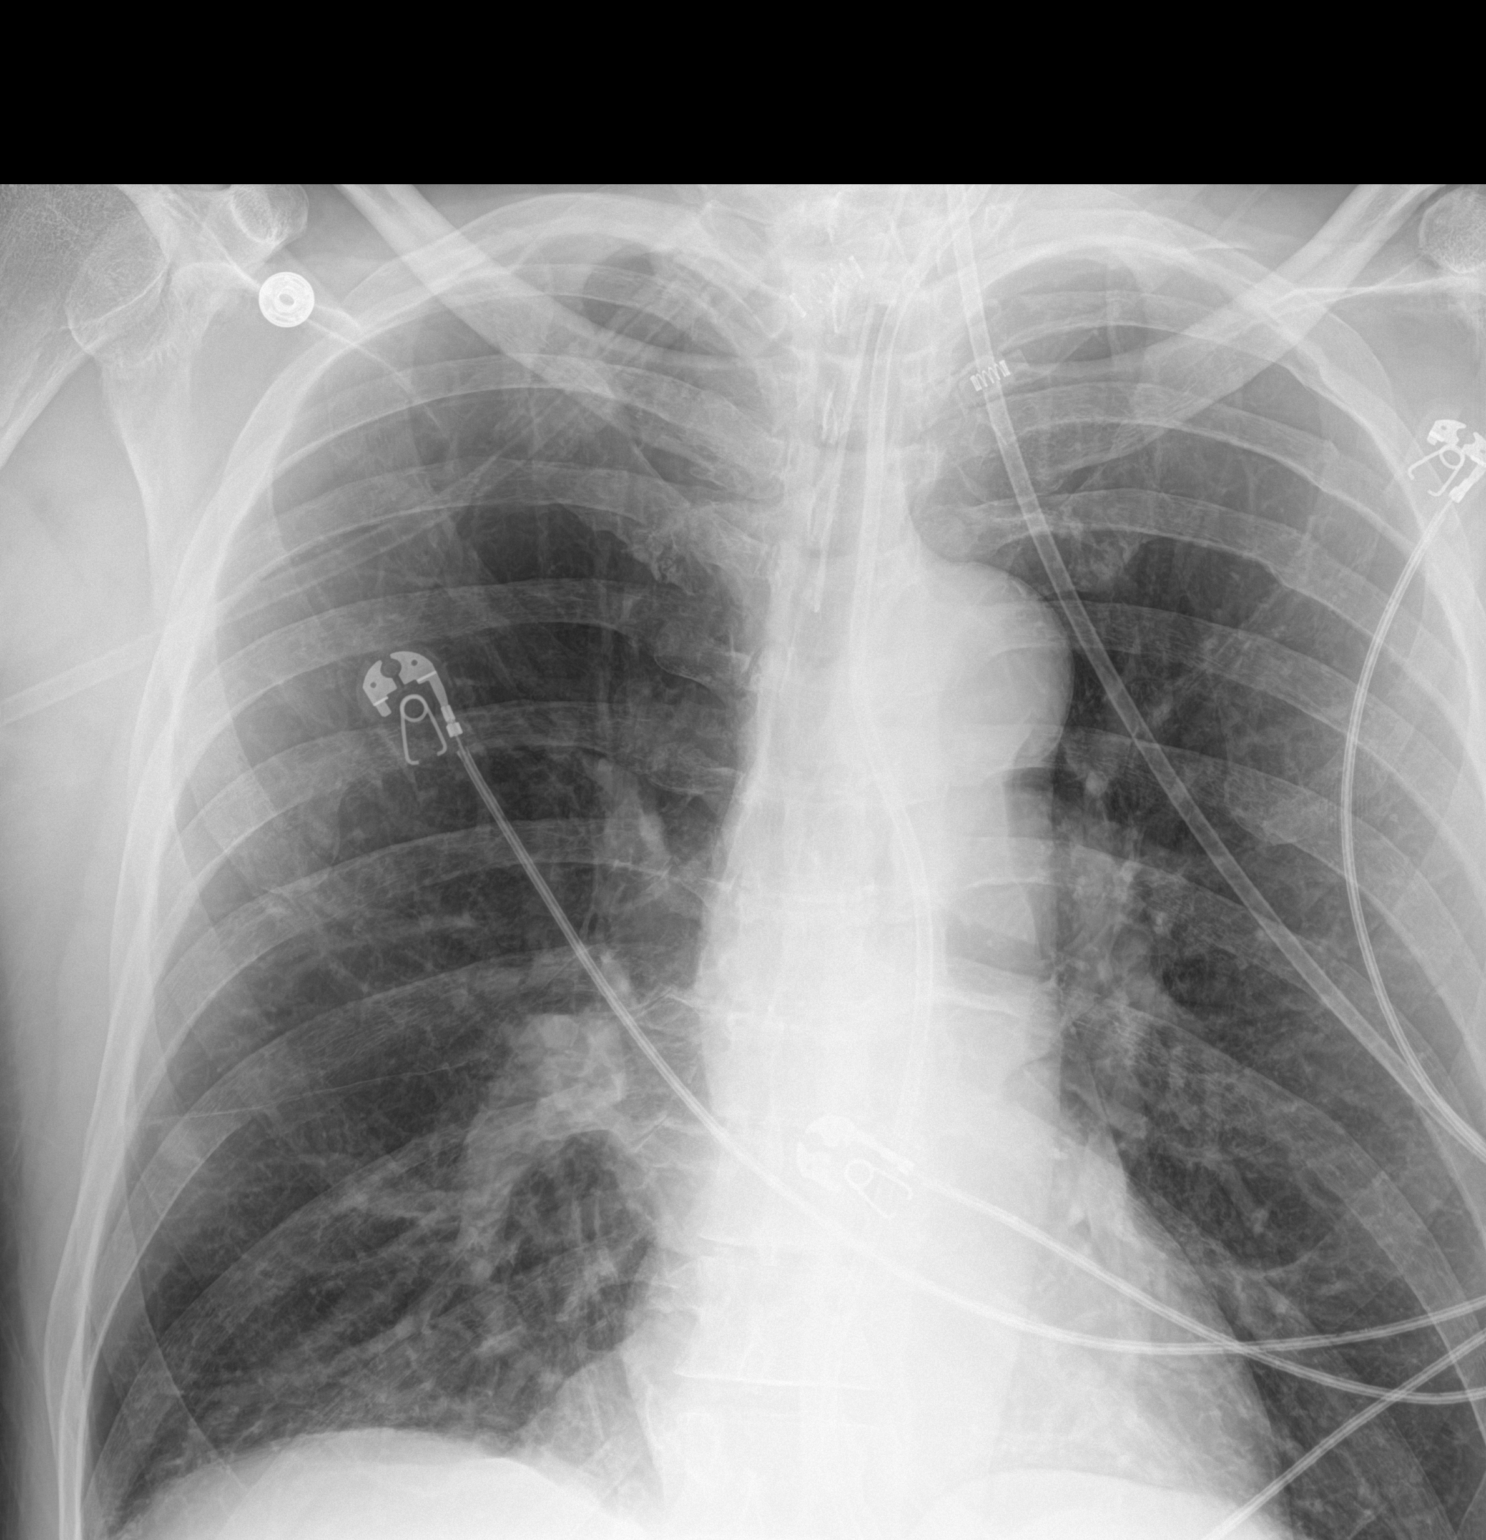
[im 2/2]
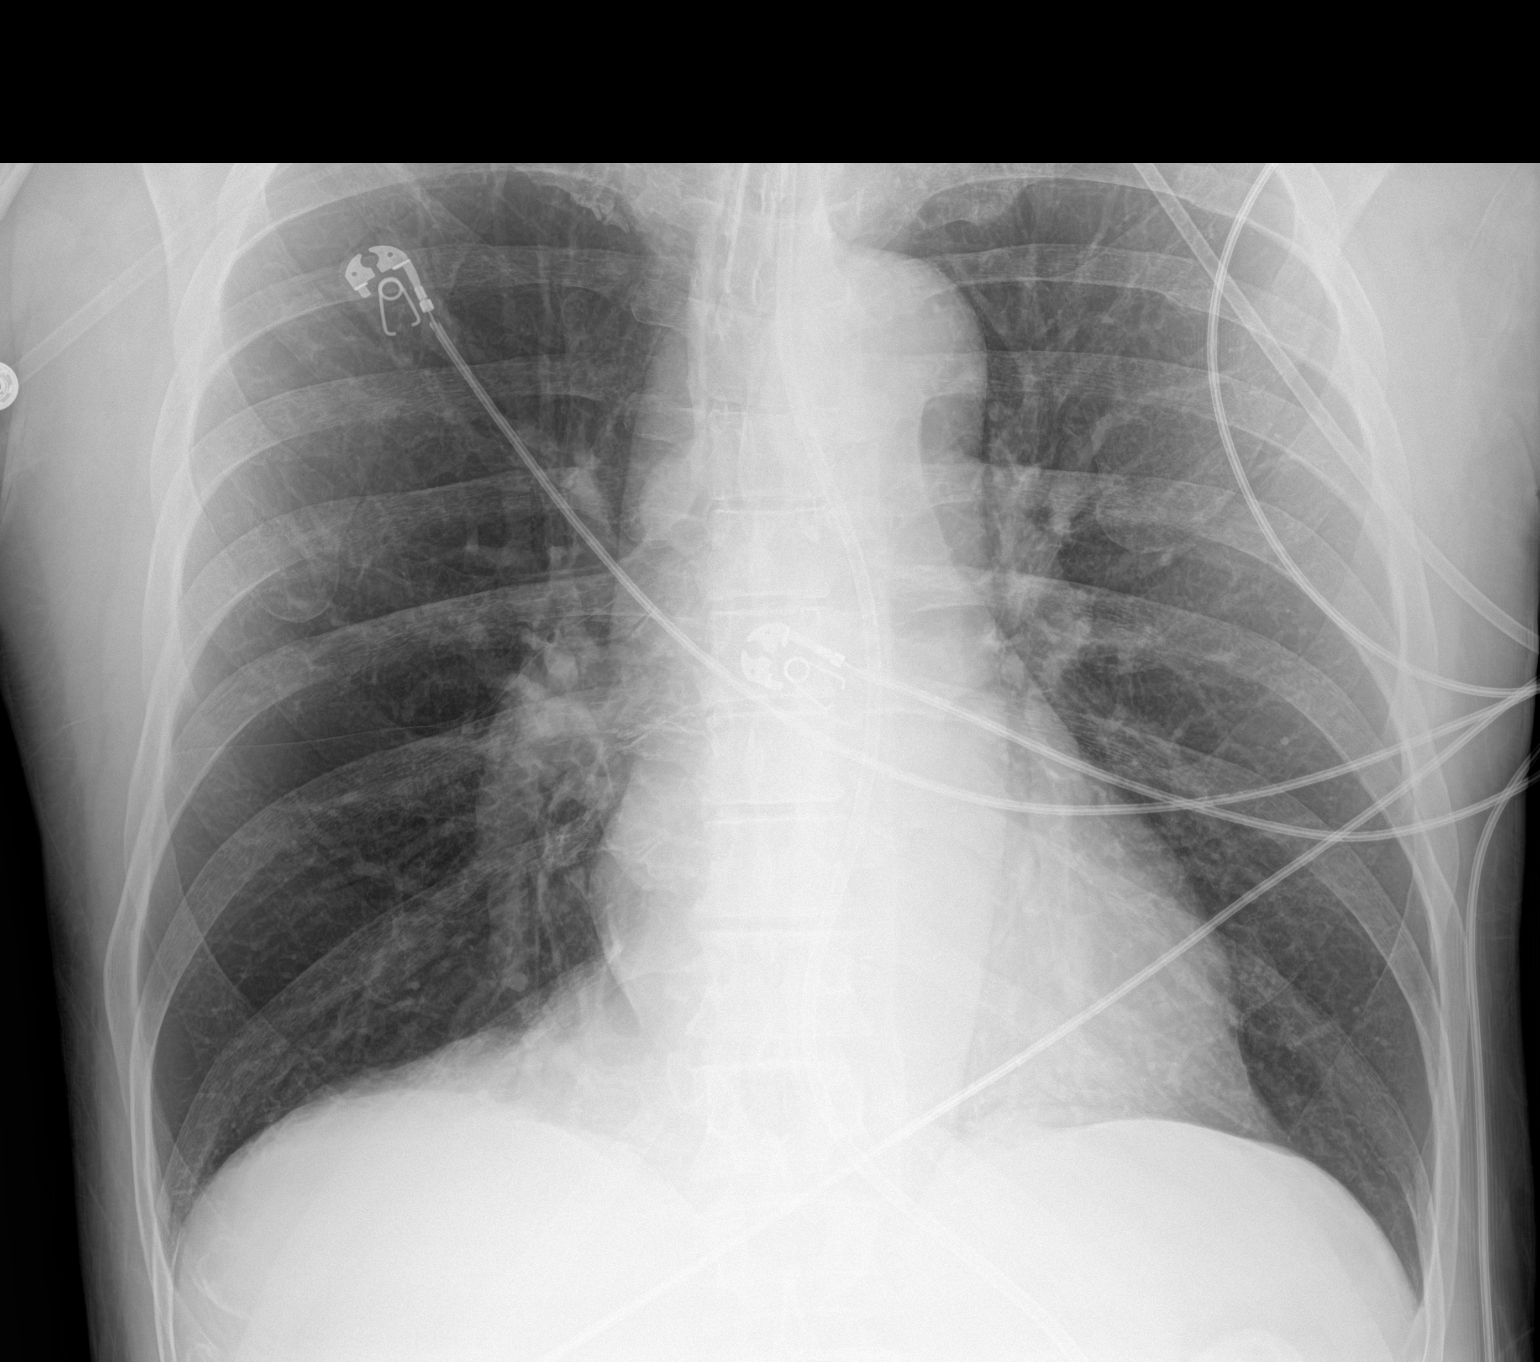

[2 of 2 positions shown; findings below may reference images not displayed]

FINDINGS: An endotracheal tube is in place with tip 5.4 cm above the carina. A
feeding tube is seen extending into the abdomen, however, the tip of
the feeding tube extends below the lower margin of the image. Lung
volumes are normal. No consolidative airspace disease. No pleural
effusions. No pneumothorax. No pulmonary nodule or mass noted.
Pulmonary vasculature and the cardiomediastinal silhouette are
within normal limits.
IMPRESSION: 1. Support apparatus, as above.
2. No radiographic evidence of acute cardiopulmonary disease.

## 2020-05-31 DIAGNOSIS — I610 Nontraumatic intracerebral hemorrhage in hemisphere, subcortical: Secondary | ICD-10-CM | POA: Diagnosis not present

## 2020-05-31 DIAGNOSIS — I110 Hypertensive heart disease with heart failure: Secondary | ICD-10-CM | POA: Diagnosis not present

## 2020-05-31 DIAGNOSIS — E854 Organ-limited amyloidosis: Secondary | ICD-10-CM | POA: Diagnosis not present

## 2020-05-31 DIAGNOSIS — I680 Cerebral amyloid angiopathy: Secondary | ICD-10-CM | POA: Diagnosis not present

## 2020-05-31 DIAGNOSIS — E291 Testicular hypofunction: Secondary | ICD-10-CM | POA: Diagnosis not present

## 2020-05-31 DIAGNOSIS — F0390 Unspecified dementia without behavioral disturbance: Secondary | ICD-10-CM | POA: Diagnosis not present

## 2020-05-31 DIAGNOSIS — F320 Major depressive disorder, single episode, mild: Secondary | ICD-10-CM | POA: Diagnosis not present

## 2020-05-31 DIAGNOSIS — J449 Chronic obstructive pulmonary disease, unspecified: Secondary | ICD-10-CM | POA: Diagnosis not present

## 2020-05-31 DIAGNOSIS — E782 Mixed hyperlipidemia: Secondary | ICD-10-CM | POA: Diagnosis not present

## 2020-05-31 DIAGNOSIS — R7302 Impaired glucose tolerance (oral): Secondary | ICD-10-CM | POA: Diagnosis not present

## 2020-05-31 DIAGNOSIS — E559 Vitamin D deficiency, unspecified: Secondary | ICD-10-CM | POA: Diagnosis not present

## 2020-05-31 DIAGNOSIS — N400 Enlarged prostate without lower urinary tract symptoms: Secondary | ICD-10-CM | POA: Diagnosis not present

## 2020-07-20 DIAGNOSIS — F4312 Post-traumatic stress disorder, chronic: Secondary | ICD-10-CM | POA: Diagnosis not present

## 2020-07-20 DIAGNOSIS — F411 Generalized anxiety disorder: Secondary | ICD-10-CM | POA: Diagnosis not present

## 2020-07-20 DIAGNOSIS — F331 Major depressive disorder, recurrent, moderate: Secondary | ICD-10-CM | POA: Diagnosis not present

## 2020-08-29 DIAGNOSIS — R3915 Urgency of urination: Secondary | ICD-10-CM | POA: Diagnosis not present

## 2020-08-29 DIAGNOSIS — N5201 Erectile dysfunction due to arterial insufficiency: Secondary | ICD-10-CM | POA: Diagnosis not present

## 2020-09-26 DIAGNOSIS — R430 Anosmia: Secondary | ICD-10-CM | POA: Diagnosis not present

## 2020-09-26 DIAGNOSIS — Z8673 Personal history of transient ischemic attack (TIA), and cerebral infarction without residual deficits: Secondary | ICD-10-CM | POA: Diagnosis not present

## 2020-09-26 DIAGNOSIS — R432 Parageusia: Secondary | ICD-10-CM | POA: Diagnosis not present

## 2020-10-02 DIAGNOSIS — E854 Organ-limited amyloidosis: Secondary | ICD-10-CM | POA: Diagnosis not present

## 2020-10-02 DIAGNOSIS — R7302 Impaired glucose tolerance (oral): Secondary | ICD-10-CM | POA: Diagnosis not present

## 2020-10-02 DIAGNOSIS — E782 Mixed hyperlipidemia: Secondary | ICD-10-CM | POA: Diagnosis not present

## 2020-10-02 DIAGNOSIS — F320 Major depressive disorder, single episode, mild: Secondary | ICD-10-CM | POA: Diagnosis not present

## 2020-10-02 DIAGNOSIS — J449 Chronic obstructive pulmonary disease, unspecified: Secondary | ICD-10-CM | POA: Diagnosis not present

## 2020-10-02 DIAGNOSIS — I610 Nontraumatic intracerebral hemorrhage in hemisphere, subcortical: Secondary | ICD-10-CM | POA: Diagnosis not present

## 2020-10-02 DIAGNOSIS — I680 Cerebral amyloid angiopathy: Secondary | ICD-10-CM | POA: Diagnosis not present

## 2020-10-02 DIAGNOSIS — F0390 Unspecified dementia without behavioral disturbance: Secondary | ICD-10-CM | POA: Diagnosis not present

## 2020-10-02 DIAGNOSIS — N400 Enlarged prostate without lower urinary tract symptoms: Secondary | ICD-10-CM | POA: Diagnosis not present

## 2020-10-02 DIAGNOSIS — Z87891 Personal history of nicotine dependence: Secondary | ICD-10-CM | POA: Diagnosis not present

## 2020-10-02 DIAGNOSIS — E559 Vitamin D deficiency, unspecified: Secondary | ICD-10-CM | POA: Diagnosis not present

## 2020-10-02 DIAGNOSIS — E291 Testicular hypofunction: Secondary | ICD-10-CM | POA: Diagnosis not present

## 2020-10-02 DIAGNOSIS — Z136 Encounter for screening for cardiovascular disorders: Secondary | ICD-10-CM | POA: Diagnosis not present

## 2020-10-02 DIAGNOSIS — I110 Hypertensive heart disease with heart failure: Secondary | ICD-10-CM | POA: Diagnosis not present

## 2020-10-17 DIAGNOSIS — H903 Sensorineural hearing loss, bilateral: Secondary | ICD-10-CM | POA: Diagnosis not present

## 2020-10-17 DIAGNOSIS — F4312 Post-traumatic stress disorder, chronic: Secondary | ICD-10-CM | POA: Diagnosis not present

## 2020-10-17 DIAGNOSIS — F331 Major depressive disorder, recurrent, moderate: Secondary | ICD-10-CM | POA: Diagnosis not present

## 2020-10-17 DIAGNOSIS — F6381 Intermittent explosive disorder: Secondary | ICD-10-CM | POA: Diagnosis not present

## 2020-10-18 DIAGNOSIS — Z87891 Personal history of nicotine dependence: Secondary | ICD-10-CM | POA: Diagnosis not present

## 2020-10-18 DIAGNOSIS — Z136 Encounter for screening for cardiovascular disorders: Secondary | ICD-10-CM | POA: Diagnosis not present

## 2020-11-15 NOTE — Progress Notes (Signed)
Cardiology Office Note:    Date:  11/16/2020   ID:  Jeremiah [name], DOB April 09, 1946, MRN [id number]  PCP:  Selena Lesser, MD Referring MD: Selena Lesser, MD    [place]  Cardiologist:  Sanda Klein, MD   Reason for visit: F/U Takotsubo's cardiomyopathy  History of Present Illness:    Jeremiah [name] is a 75 y.o. male with a hx of HTN, HLD, COPD, OSA, PTSD, Takotsubo cardiomyopathy, history of intracranial bleed, and history of PE.  Patient was admitted in May 2020 with small segmental PE and intracranial bleed.  He required intubation during the hospitalization due to bronchospasm and also developed worsening renal function.  Echocardiogram demonstrated severely reduced EF of 25%, findings consistent with Takotsubo cardiomyopathy, grade 2 DD.  He also had elevated troponin which was felt to be demand ischemia.  Follow-up echocardiogram on 09/2018 showed EF normalized to 60 to 65%, grade 1 DD.  He last was evaluated by our office in February 2021 and was feeling well.    Today he comes in the clinic with his wife.  His wife states he is doing better.  He walks the dogs without pain or shortness of breath.  He denies heart failure symptoms.  No PND, orthopnea, bloating, weight gain or lower extremity edema.  No dizziness, lightheadedness or syncope.  No bleeding issues.  We reviewed his extensive hospitalization in 2020 when his wife was told the patient would not survive.  She stated prior to admission, the patient was extremely short of breath and constantly using nebulizers and with his current medication regimen, he has not had to use her rescue inhaler anymore.    Past Medical History:  Diagnosis Date   COPD (chronic obstructive pulmonary disease) ([place])    Depression    Dilated cardiomyopathy (HCC)    Hypertension    PTSD (post-traumatic stress disorder)    Sleep apnea     Past Surgical History:  Procedure Laterality Date   HAMMER TOE SURGERY  April 2017     Current Medications: Current Meds  Medication Sig   acetaminophen (TYLENOL) 325 MG tablet Take 2 tablets (650 mg total) by mouth every 6 (six) hours as needed for mild pain.   albuterol (VENTOLIN HFA) 108 (90 Base) MCG/ACT inhaler Inhale 2 puffs into the lungs every 4 (four) hours as needed for wheezing or shortness of breath (((PLAN B))).   aspirin EC 81 MG tablet Take 81 mg by mouth daily.   budesonide-formoterol (SYMBICORT) 160-4.5 MCG/ACT inhaler INHALE 2 PUFFS BY MOUTH TWICE A DAY FOR COPD *CONVERSION FROM TRELEGY; APPROVED* REFILLS DEFERRED TO PCP   carbamazepine (TEGRETOL) 200 MG tablet Take 200 mg by mouth 2 (two) times daily.   esomeprazole (NEXIUM) 40 MG capsule Take 1 capsule (40 mg total) by mouth daily before breakfast.   Fluticasone-Umeclidin-Vilant (TRELEGY ELLIPTA) 100-62.5-25 MCG/INH AEPB Inhale 1 puff into the lungs daily.   isosorbide-hydrALAZINE (BIDIL) 20-37.5 MG tablet Take 1 tablet by mouth 3 (three) times daily.   Multiple Vitamin (MULTIVITAMIN WITH MINERALS) TABS tablet Take 1 tablet by mouth daily.   oxybutynin (DITROPAN) 5 MG tablet Take 5 mg by mouth 3 (three) times daily.   pravastatin (PRAVACHOL) 10 MG tablet Take 1 tablet (10 mg total) by mouth daily.   QUEtiapine (SEROQUEL) 50 MG tablet Take 1 tablet (50 mg total) by mouth at bedtime.   triamcinolone ointment (KENALOG) 0.1 % Apply topically 2 (two) times daily.   [DISCONTINUED] furosemide (LASIX) 20 MG  tablet Take 1 tablet (20 mg total) by mouth daily.     Allergies:   Patient has no known allergies.   Social History   Socioeconomic History   Marital status: Married    Spouse name: Not on file   Number of children: 2   Years of education: 16   Highest education level: Not on file  Occupational History   Occupation: Retired Engineer, maintenance  Tobacco Use   Smoking status: Former    Packs/day: 1.00    Years: 30.00    Pack years: 30.00    Types: Cigarettes    Quit date: 04/16/1987    Years since  quitting: 33.6   Smokeless tobacco: Never  Substance and Sexual Activity   Alcohol use: Yes    Alcohol/week: 2.0 standard drinks    Types: 2 Glasses of wine per week   Drug use: No   Sexual activity: Not on file  Other Topics Concern   Not on file  Social History Narrative   Fun: Live   Denies religious beliefs effecting health care.   Feels safe at home and denies abuse.    Social Determinants of Health   Financial Resource Strain: Not on file  Food Insecurity: Not on file  Transportation Needs: Not on file  Physical Activity: Not on file  Stress: Not on file  Social Connections: Not on file     Family History: The patient's family history includes Asthma in his maternal grandmother; Dementia in his mother; Heart disease in his father.  ROS:   Please see the history of present illness.     EKGs/Labs/Other Studies Reviewed:    EKG:  The ekg ordered today demonstrates normal sinus rhythm, heart rate 66, QRS duration 86 ms  Recent Labs: No results found for requested labs within last 8760 hours.  Recent Lipid Panel    Component Value Date/Time   CHOL 159 08/31/2018 1830   TRIG 76 09/06/2018 0521   HDL 66 08/31/2018 1830   CHOLHDL 2.4 08/31/2018 1830   VLDL 10 08/31/2018 1830   LDLCALC 83 08/31/2018 1830    Physical Exam:    VS:  BP 125/77   Pulse 66   Ht '6\' 2"'$  (1.88 m)   Wt 156 lb 6.4 oz (70.9 kg)   SpO2 94%   BMI 20.08 kg/m     Wt Readings from Last 3 Encounters:  11/16/20 156 lb 6.4 oz (70.9 kg)  02/01/20 162 lb 3.2 oz (73.6 kg)  06/21/19 160 lb (72.6 kg)     GEN:  Well nourished, well developed in no acute distress, slender HEENT: Normal NECK: No JVD; No carotid bruits CARDIAC: RRR, no murmurs, rubs, gallops RESPIRATORY:  Clear to auscultation without rales, wheezing or rhonchi  ABDOMEN: Soft, non-tender, non-distended MUSCULOSKELETAL: No edema; No deformity  SKIN: Warm and dry NEUROLOGIC:  Alert and oriented PSYCHIATRIC:  Normal affect    ASSESSMENT AND PLAN   Takotsubo cardiomyopathy, resolved -EF normalized by echo in June 2020 -Continue BiDil.  He was likely started on BiDil secondary to renal dysfunction during 2020 hospital admission. Now Cr normal. I offered that we could switch this medication to an ACE or ARB for once daily dosing.  Since the patient is doing so well and blood pressure is well controlled, they wish to continue BiDil. -We will change Lasix to as needed dosing.  Gave instructions on monitoring for shortness of breath, leg swelling and weight gain.    Hypertension, well controlled -Continue BiDil.  See above. -Goal BP is <130/80.  Recommend DASH diet (high in vegetables, fruits, low-fat dairy products, whole grains, poultry, fish, and nuts and low in sweets, sugar-sweetened beverages, and red meats), salt restriction and increase physical activity.  Hyperlipidemia -LDL was 75 in June 2022.  Continue pravastatin.  Lipids followed by PCP. - Discussed cholesterol lowering diets - Mediterranean diet, DASH diet, vegetarian diet, low-carbohydrate diet and avoidance of trans fats.  Discussed healthier choice substitutes.  Nuts, high-fiber foods, and fiber supplements may also improve lipids.    Disposition - Follow-up in 1 year with Dr. Sallyanne Kuster   Medication Adjustments/Labs and Tests Ordered: Current medicines are reviewed at length with the patient today.  Concerns regarding medicines are outlined above.  Orders Placed This Encounter  Procedures   EKG 12-Lead   Meds ordered this encounter  Medications   furosemide (LASIX) 20 MG tablet    Sig: Take 1 tablet (20 mg total) by mouth as needed.    Dispense:  30 tablet    Refill:  0    Patient Instructions  Medication Instructions:  Lasix 20 (Take as Needed). *If you need a refill on your cardiac medications before your next appointment, please call your pharmacy*   Lab Work: No Labs If you have labs (blood work) drawn today and your tests are  completely normal, you will receive your results only by: [place] (if you have MyChart) OR A paper copy in the mail If you have any lab test that is abnormal or we need to change your treatment, we will call you to review the results.   Testing/Procedures: No Testing   Follow-Up: At [facility], you and your health needs are our priority.  As part of our continuing mission to provide you with exceptional heart care, we have created designated Provider Care Teams.  These Care Teams include your primary Cardiologist (physician) and Advanced Practice Providers (APPs -  Physician Assistants and Nurse Practitioners) who all work together to provide you with the care you need, when you need it.  Your next appointment:   1 year(s)  The format for your next appointment:   In Person  Provider:   Sanda Klein, MD  Insomnia Insomnia is a sleep disorder that makes it difficult to fall asleep or stay asleep. Insomnia can cause fatigue, low energy, difficulty concentrating, moodswings, and poor performance at work or school. There are three different ways to classify insomnia: Difficulty falling asleep. Difficulty staying asleep. Waking up too early in the morning. Any type of insomnia can be long-term (chronic) or short-term (acute). Both are common. Short-term insomnia usually lasts for three months or less. Chronic insomnia occurs at least three times a week for longer than threemonths. What are the causes? Insomnia may be caused by another condition, situation, or substance, such as: Anxiety. Certain medicines. Gastroesophageal reflux disease (GERD) or other gastrointestinal conditions. Asthma or other breathing conditions. Restless legs syndrome, sleep apnea, or other sleep disorders. Chronic pain. Menopause. Stroke. Abuse of alcohol, tobacco, or illegal drugs. Mental health conditions, such as depression. Caffeine. Neurological disorders, such as Alzheimer's  disease. An overactive thyroid (hyperthyroidism). Sometimes, the cause of insomnia may not be known. What increases the risk? Risk factors for insomnia include: Gender. Women are affected more often than men. Age. Insomnia is more common as you get older. Stress. Lack of exercise. Irregular work schedule or working night shifts. Traveling between different time zones. Certain medical and mental health conditions. What are the signs or symptoms?  If you have insomnia, the main symptom is having trouble falling asleep or having trouble staying asleep. This may lead to other symptoms, such as: Feeling fatigued or having low energy. Feeling nervous about going to sleep. Not feeling rested in the morning. Having trouble concentrating. Feeling irritable, anxious, or depressed. How is this diagnosed? This condition may be diagnosed based on: Your symptoms and medical history. Your health care provider may ask about: Your sleep habits. Any medical conditions you have. Your mental health. A physical exam. How is this treated? Treatment for insomnia depends on the cause. Treatment may focus on treating an underlying condition that is causing insomnia. Treatment may also include: Medicines to help you sleep. Counseling or therapy. Lifestyle adjustments to help you sleep better. Follow these instructions at home: Eating and drinking  Limit or avoid alcohol, caffeinated beverages, and cigarettes, especially close to bedtime. These can disrupt your sleep. Do not eat a large meal or eat spicy foods right before bedtime. This can lead to digestive discomfort that can make it hard for you to sleep.  Sleep habits  Keep a sleep diary to help you and your health care provider figure out what could be causing your insomnia. Write down: When you sleep. When you wake up during the night. How well you sleep. How rested you feel the next day. Any side effects of medicines you are taking. What you  eat and drink. Make your bedroom a dark, comfortable place where it is easy to fall asleep. Put up shades or blackout curtains to block light from outside. Use a white noise machine to block noise. Keep the temperature cool. Limit screen use before bedtime. This includes: Watching TV. Using your smartphone, tablet, or computer. Stick to a routine that includes going to bed and waking up at the same times every day and night. This can help you fall asleep faster. Consider making a quiet activity, such as reading, part of your nighttime routine. Try to avoid taking naps during the day so that you sleep better at night. Get out of bed if you are still awake after 15 minutes of trying to sleep. Keep the lights down, but try reading or doing a quiet activity. When you feel sleepy, go back to bed.  General instructions Take over-the-counter and prescription medicines only as told by your health care provider. Exercise regularly, as told by your health care provider. Avoid exercise starting several hours before bedtime. Use relaxation techniques to manage stress. Ask your health care provider to suggest some techniques that may work well for you. These may include: Breathing exercises. Routines to release muscle tension. Visualizing peaceful scenes. Make sure that you drive carefully. Avoid driving if you feel very sleepy. Keep all follow-up visits as told by your health care provider. This is important. Contact a health care provider if: You are tired throughout the day. You have trouble in your daily routine due to sleepiness. You continue to have sleep problems, or your sleep problems get worse. Get help right away if: You have serious thoughts about hurting yourself or someone else. If you ever feel like you may hurt yourself or others, or have thoughts about taking your own life, get help right away. You can go to your nearest emergency department or call: Your local emergency services (911  in the [place] A suicide crisis helpline, such as the [place] at (818) 702-3941. This is open 24 hours a day. Summary Insomnia is a sleep disorder that makes  it difficult to fall asleep or stay asleep. Insomnia can be long-term (chronic) or short-term (acute). Treatment for insomnia depends on the cause. Treatment may focus on treating an underlying condition that is causing insomnia. Keep a sleep diary to help you and your health care provider figure out what could be causing your insomnia. This information is not intended to replace advice given to you by your health care provider. Make sure you discuss any questions you have with your healthcare provider. Document Revised: 02/10/2020 Document Reviewed: 02/10/2020 Elsevier Patient Education  2022 [place], Warren Lacy, Hershal Coria  11/16/2020 12:07 PM    [place]

## 2020-11-16 ENCOUNTER — Other Ambulatory Visit: Payer: Self-pay

## 2020-11-16 ENCOUNTER — Ambulatory Visit (INDEPENDENT_AMBULATORY_CARE_PROVIDER_SITE_OTHER): Payer: Medicare Other | Admitting: Physician Assistant

## 2020-11-16 VITALS — BP 125/77 | HR 66 | Ht 74.0 in | Wt 156.4 lb

## 2020-11-16 DIAGNOSIS — E785 Hyperlipidemia, unspecified: Secondary | ICD-10-CM

## 2020-11-16 DIAGNOSIS — I10 Essential (primary) hypertension: Secondary | ICD-10-CM

## 2020-11-16 DIAGNOSIS — I5181 Takotsubo syndrome: Secondary | ICD-10-CM | POA: Diagnosis not present

## 2020-11-16 MED ORDER — FUROSEMIDE 20 MG PO TABS: 20.0000 mg | ORAL_TABLET | ORAL | 0 refills | Status: DC | PRN

## 2020-11-16 NOTE — Patient Instructions (Signed)
Medication Instructions:  Lasix 20 (Take as Needed). *If you need a refill on your cardiac medications before your next appointment, please call your pharmacy*   Lab Work: No Labs If you have labs (blood work) drawn today and your tests are completely normal, you will receive your results only by: [place] (if you have MyChart) OR A paper copy in the mail If you have any lab test that is abnormal or we need to change your treatment, we will call you to review the results.   Testing/Procedures: No Testing   Follow-Up: At [facility], you and your health needs are our priority.  As part of our continuing mission to provide you with exceptional heart care, we have created designated Provider Care Teams.  These Care Teams include your primary Cardiologist (physician) and Advanced Practice Providers (APPs -  Physician Assistants and Nurse Practitioners) who all work together to provide you with the care you need, when you need it.  Your next appointment:   1 year(s)  The format for your next appointment:   In Person  Provider:   Sanda Klein, MD  Insomnia Insomnia is a sleep disorder that makes it difficult to fall asleep or stay asleep. Insomnia can cause fatigue, low energy, difficulty concentrating, moodswings, and poor performance at work or school. There are three different ways to classify insomnia: Difficulty falling asleep. Difficulty staying asleep. Waking up too early in the morning. Any type of insomnia can be long-term (chronic) or short-term (acute). Both are common. Short-term insomnia usually lasts for three months or less. Chronic insomnia occurs at least three times a week for longer than threemonths. What are the causes? Insomnia may be caused by another condition, situation, or substance, such as: Anxiety. Certain medicines. Gastroesophageal reflux disease (GERD) or other gastrointestinal conditions. Asthma or other breathing conditions. Restless  legs syndrome, sleep apnea, or other sleep disorders. Chronic pain. Menopause. Stroke. Abuse of alcohol, tobacco, or illegal drugs. Mental health conditions, such as depression. Caffeine. Neurological disorders, such as Alzheimer's disease. An overactive thyroid (hyperthyroidism). Sometimes, the cause of insomnia may not be known. What increases the risk? Risk factors for insomnia include: Gender. Women are affected more often than men. Age. Insomnia is more common as you get older. Stress. Lack of exercise. Irregular work schedule or working night shifts. Traveling between different time zones. Certain medical and mental health conditions. What are the signs or symptoms? If you have insomnia, the main symptom is having trouble falling asleep or having trouble staying asleep. This may lead to other symptoms, such as: Feeling fatigued or having low energy. Feeling nervous about going to sleep. Not feeling rested in the morning. Having trouble concentrating. Feeling irritable, anxious, or depressed. How is this diagnosed? This condition may be diagnosed based on: Your symptoms and medical history. Your health care provider may ask about: Your sleep habits. Any medical conditions you have. Your mental health. A physical exam. How is this treated? Treatment for insomnia depends on the cause. Treatment may focus on treating an underlying condition that is causing insomnia. Treatment may also include: Medicines to help you sleep. Counseling or therapy. Lifestyle adjustments to help you sleep better. Follow these instructions at home: Eating and drinking  Limit or avoid alcohol, caffeinated beverages, and cigarettes, especially close to bedtime. These can disrupt your sleep. Do not eat a large meal or eat spicy foods right before bedtime. This can lead to digestive discomfort that can make it hard for you to sleep.  Sleep habits  Keep a sleep diary to help you and your health  care provider figure out what could be causing your insomnia. Write down: When you sleep. When you wake up during the night. How well you sleep. How rested you feel the next day. Any side effects of medicines you are taking. What you eat and drink. Make your bedroom a dark, comfortable place where it is easy to fall asleep. Put up shades or blackout curtains to block light from outside. Use a white noise machine to block noise. Keep the temperature cool. Limit screen use before bedtime. This includes: Watching TV. Using your smartphone, tablet, or computer. Stick to a routine that includes going to bed and waking up at the same times every day and night. This can help you fall asleep faster. Consider making a quiet activity, such as reading, part of your nighttime routine. Try to avoid taking naps during the day so that you sleep better at night. Get out of bed if you are still awake after 15 minutes of trying to sleep. Keep the lights down, but try reading or doing a quiet activity. When you feel sleepy, go back to bed.  General instructions Take over-the-counter and prescription medicines only as told by your health care provider. Exercise regularly, as told by your health care provider. Avoid exercise starting several hours before bedtime. Use relaxation techniques to manage stress. Ask your health care provider to suggest some techniques that may work well for you. These may include: Breathing exercises. Routines to release muscle tension. Visualizing peaceful scenes. Make sure that you drive carefully. Avoid driving if you feel very sleepy. Keep all follow-up visits as told by your health care provider. This is important. Contact a health care provider if: You are tired throughout the day. You have trouble in your daily routine due to sleepiness. You continue to have sleep problems, or your sleep problems get worse. Get help right away if: You have serious thoughts about hurting  yourself or someone else. If you ever feel like you may hurt yourself or others, or have thoughts about taking your own life, get help right away. You can go to your nearest emergency department or call: Your local emergency services (911 in the [place] A suicide crisis helpline, such as the [place] at 606-759-8421. This is open 24 hours a day. Summary Insomnia is a sleep disorder that makes it difficult to fall asleep or stay asleep. Insomnia can be long-term (chronic) or short-term (acute). Treatment for insomnia depends on the cause. Treatment may focus on treating an underlying condition that is causing insomnia. Keep a sleep diary to help you and your health care provider figure out what could be causing your insomnia. This information is not intended to replace advice given to you by your health care provider. Make sure you discuss any questions you have with your healthcare provider. Document Revised: 02/10/2020 Document Reviewed: 02/10/2020 Elsevier Patient Education  2022 [employer].

## 2020-12-26 ENCOUNTER — Other Ambulatory Visit: Payer: Self-pay

## 2020-12-26 ENCOUNTER — Telehealth: Payer: Self-pay | Admitting: Physician Assistant

## 2020-12-26 NOTE — Telephone Encounter (Signed)
Wife called back- She wanted to discuss medication list.  She was asking about patient being on Bidil medication, and the lasix. I did advise of last visit and reviewed notes that they discussed switching but because he was doing well and BP was good they continued it, and lasix was sent over as needed for weight gain, or shortness of breath.  Patient wife verbalized understanding.   She did mention two medications were not on the list and requested I add them.  -Nifedipine 30 mg daily added  -Finasteride 5 mg daily added.   She wanted to make sure Caron Presume, PA-C was okay with this and no other medication changes should be made.  Advised I would call back if any further should be done.   Patient wife verbalized understanding, thankful for call back. Medication list updated as well.

## 2020-12-26 NOTE — Telephone Encounter (Signed)
New Message:         Please call patient's wife. She needs to go over his medication list. She is not sure what medicine he is supposed to be taking.

## 2020-12-26 NOTE — Telephone Encounter (Signed)
Contacted patient wife, went to voicemail, left call back number to return call.

## 2021-01-15 DIAGNOSIS — F331 Major depressive disorder, recurrent, moderate: Secondary | ICD-10-CM | POA: Diagnosis not present

## 2021-01-15 DIAGNOSIS — F6381 Intermittent explosive disorder: Secondary | ICD-10-CM | POA: Diagnosis not present

## 2021-01-15 DIAGNOSIS — F4312 Post-traumatic stress disorder, chronic: Secondary | ICD-10-CM | POA: Diagnosis not present

## 2021-02-01 ENCOUNTER — Ambulatory Visit (INDEPENDENT_AMBULATORY_CARE_PROVIDER_SITE_OTHER): Payer: Medicare Other | Admitting: Adult Health

## 2021-02-01 ENCOUNTER — Encounter: Payer: Self-pay | Admitting: Adult Health

## 2021-02-01 VITALS — BP 140/76 | HR 65 | Ht 74.0 in | Wt 160.0 lb

## 2021-02-01 DIAGNOSIS — I69319 Unspecified symptoms and signs involving cognitive functions following cerebral infarction: Secondary | ICD-10-CM

## 2021-02-01 DIAGNOSIS — I610 Nontraumatic intracerebral hemorrhage in hemisphere, subcortical: Secondary | ICD-10-CM | POA: Diagnosis not present

## 2021-02-01 DIAGNOSIS — I639 Cerebral infarction, unspecified: Secondary | ICD-10-CM

## 2021-02-01 DIAGNOSIS — E785 Hyperlipidemia, unspecified: Secondary | ICD-10-CM

## 2021-02-01 DIAGNOSIS — I10 Essential (primary) hypertension: Secondary | ICD-10-CM

## 2021-02-01 NOTE — Patient Instructions (Signed)
Continue aspirin 81 mg daily  and pravastatin 10mg  daily  for secondary stroke prevention  Continue to follow up with PCP regarding cholesterol and blood pressure management  Maintain strict control of hypertension with blood pressure goal below 130/90 and cholesterol with LDL cholesterol (bad cholesterol) goal below 70 mg/dL.   Important to do memory exercises such as cross word puzzles, word search, etc. Also staying active and social, managing stroke risk factors and ensuring good sleep will help prevent worsening  Continue to monitor shin symptoms - please let us know if this worsens        Followup in the future with me in 1 year or call earlier if needed       Thank you for coming to see [place] at [facility]. I hope we have been able to provide you high quality care today.  You may receive a patient satisfaction survey over the next few weeks. We would appreciate your feedback and comments so that we may continue to improve ourselves and the health of our patients.

## 2021-02-01 NOTE — Progress Notes (Signed)
[facility] [address] [place] [address] [phone]       STROKE FOLLOW UP NOTE  Mr. Onofrio Klemp Date of Birth:  1945/06/12 Medical Record Number:  [id number]   Reason for Referral: stroke follow up    CHIEF COMPLAINT:  Chief Complaint  Patient presents with   Follow-up    Rm 2 with [name]  Pt is well and stable, no new stroke symptoms      HPI:   Update 02/01/2021 JM: Returns for yearly stroke follow-up accompanied by his wife.  Overall stable from stroke standpoint without new stroke/TIA symptoms.  Reports cognition stable without worsening.  MMSE today 23/30.  Compliant on aspirin and pravastatin without side effects.  Blood pressure today 140/76.  Continues to follow with psychiatry for depression on carbamazepine and Seroquel.  Does report recent onset of occasional numbness type sensation of left shin when he bends his leg in the shower - no associated pain in hip, knee or ankle. No injury to that area. Does not radiate into knee or foot.  No further concerns at this time.    History provided for reference purposes only Update 02/01/2020 JM: Mr. Hosier returns for stroke follow-up accompanied by his wife.  Stable from stroke standpoint with residual cognitive impairment which has been relatively stable since prior visit.  He continues to experience good days and bad days and at times will need more assistance with ADLs and IADLs.  Denies new stroke/TIA symptoms.  Remains on aspirin 81 mg daily and pravastatin 10 mg daily for secondary stroke prevention without side effects.  Blood pressure today 139/74.  Currently on carbamazepine and Seroquel for major depression disorder managed by [facility] psychiatry. Wife does report over the past month he has been reporting increased "fear" but patient unable to further elaborate his symptoms of fearfullness or cause.  She also reports increased anxiety during certain situations such as riding in the car and is  wondering if a medication such as Xanax will be beneficial.  No further concerns at this time.  Update 06/21/2019 JM: Mr. Huttner is a 75 year old male who is being seen today, 06/21/2019, for follow-up regarding prior stroke and cognitive impairment accompanied by his wife.  Since prior visit, memory loss/cognition has been stable with MMSE today 23/30. Wife does report wax/waning of symptoms but overall stable.  Continues on aspirin 81 mg daily and pravastatin for secondary stroke prevention without side effects.  Blood pressure today 141/78.  Will occasionally monitor at home and typically SBP 120s but can fluctuate.  He does report pulsatile tinnitus in the ears bilaterally which has been present "for some time" without changes.  No concerns at this time.  Initial visit 11/18/2018 JM: He has been stable from a neurological standpoint since discharge.  Wife is unsure of mental status worsening as it will wax/wane daily.  He was recently evaluated prior to hospitalization for concerns of memory loss but fortunately did not have follow-up with provider due to being hospitalized.  Denies behavioral concerns.  He continues to work with physical therapy due to his gait and overall strengthening.  He is able to ambulate with rolling walker at home but currently in wheelchair due to distance.  Wife plans on working with [facility] in order to obtain additional speech therapy.  He continues on aspirin 81 mg without bleeding or bruising.  Continues on pravastatin without myalgias.  Blood pressure today 148/86 which is slightly elevated as home readings typically SBP 110-120s.  No  further concerns at this time.  Denies new or worsening stroke/TIA symptoms.  Stroke admission 08/31/2018: Mr. Naman Spychalski is a 75 y.o. male with history of dementia, COPD, HTN, OSA admitted for SOB.  MRI showed left frontal punctate infarcts and tiny ICH with hemosiderosis consistent with CAA.  Vessel imaging unremarkable.  Lower extremity venous  Dopplers negative for DVT.  2D echo EF of 20-25%.  LDL 83 and A1c 5.6.  Fluctuation of blood pressures during admission with underlying history of HTN.  Recommended continuation of pravastatin 10 mg daily for HLD management.  CT chest showed small LLL segmental PE but unable to initiate AC due to CAA.  Consider initiating aspirin 81 mg if repeat CT head stable.  EEG performed which showed encephalopathic state but no seizure activity.  He was discharged to CIR for ongoing deficits and then discharged home in stable condition with recommendation of 24/7 supervision and home health PT/OT/SLP and nursing.     ROS:   14 system review of systems performed and negative with exception of those listed in HPI  PMH:  Past Medical History:  Diagnosis Date   COPD (chronic obstructive pulmonary disease) (HCC)    Depression    Dilated cardiomyopathy (HCC)    Hypertension    PTSD (post-traumatic stress disorder)    Sleep apnea     PSH:  Past Surgical History:  Procedure Laterality Date   HAMMER TOE SURGERY  April 2017    Social History:  Social History   Socioeconomic History   Marital status: Married    Spouse name: Not on file   Number of children: 2   Years of education: 16   Highest education level: Not on file  Occupational History   Occupation: Retired Engineer, maintenance  Tobacco Use   Smoking status: Former    Packs/day: 1.00    Years: 30.00    Pack years: 30.00    Types: Cigarettes    Quit date: 04/16/1987    Years since quitting: 33.8   Smokeless tobacco: Never  Substance and Sexual Activity   Alcohol use: Yes    Alcohol/week: 2.0 standard drinks    Types: 2 Glasses of wine per week   Drug use: No   Sexual activity: Not on file  Other Topics Concern   Not on file  Social History Narrative   Fun: Live   Denies religious beliefs effecting health care.   Feels safe at home and denies abuse.    Social Determinants of Health   Financial Resource Strain: Not on file  Food  Insecurity: Not on file  Transportation Needs: Not on file  Physical Activity: Not on file  Stress: Not on file  Social Connections: Not on file  Intimate Partner Violence: Not on file    Family History:  Family History  Problem Relation Age of Onset   Asthma Maternal Grandmother    Heart disease Father    Dementia Mother     Medications:   Current Outpatient Medications on File Prior to Visit  Medication Sig Dispense Refill   acetaminophen (TYLENOL) 325 MG tablet Take 2 tablets (650 mg total) by mouth every 6 (six) hours as needed for mild pain.     albuterol (VENTOLIN HFA) 108 (90 Base) MCG/ACT inhaler Inhale 2 puffs into the lungs every 4 (four) hours as needed for wheezing or shortness of breath (((PLAN B))). 8 g 5   aspirin EC 81 MG tablet Take 81 mg by mouth daily.  budesonide-formoterol (SYMBICORT) 160-4.5 MCG/ACT inhaler INHALE 2 PUFFS BY MOUTH TWICE A DAY FOR COPD *CONVERSION FROM TRELEGY; APPROVED* REFILLS DEFERRED TO PCP     carbamazepine (TEGRETOL) 200 MG tablet Take 200 mg by mouth 2 (two) times daily.     esomeprazole (NEXIUM) 40 MG capsule Take 1 capsule (40 mg total) by mouth daily before breakfast. 30 capsule 2   finasteride (PROSCAR) 5 MG tablet Take 1 tablet by mouth daily.     furosemide (LASIX) 20 MG tablet Take 1 tablet (20 mg total) by mouth as needed. 30 tablet 0   isosorbide-hydrALAZINE (BIDIL) 20-37.5 MG tablet Take 1 tablet by mouth 3 (three) times daily. 90 tablet 0   Multiple Vitamin (MULTIVITAMIN WITH MINERALS) TABS tablet Take 1 tablet by mouth daily. 30 tablet 0   NIFEdipine (PROCARDIA-XL/NIFEDICAL-XL) 30 MG 24 hr tablet Take by mouth.     oxybutynin (DITROPAN) 5 MG tablet Take 5 mg by mouth 3 (three) times daily.     pravastatin (PRAVACHOL) 10 MG tablet Take 1 tablet (10 mg total) by mouth daily. 30 tablet 0   QUEtiapine (SEROQUEL) 50 MG tablet Take 1 tablet (50 mg total) by mouth at bedtime. 30 tablet 0   triamcinolone ointment (KENALOG) 0.1 %  Apply topically 2 (two) times daily.     No current facility-administered medications on file prior to visit.    Allergies:  No Known Allergies   Physical Exam  Vitals:   02/01/21 1024  BP: 140/76  Pulse: 65  Weight: 160 lb (72.6 kg)  Height: 6\' 2"  (1.88 m)    Body mass index is 20.54 kg/m. No results found.  General: well developed, well nourished, pleasant elderly African-American male, seated, in no evident distress Head: head normocephalic and atraumatic.   Neck: supple with no carotid or supraclavicular bruits Cardiovascular: regular rate and rhythm, no murmurs Musculoskeletal: no deformity Skin:  no rash/petichiae Vascular:  Normal pulses all extremities   Neurologic Exam Mental Status: Awake and fully alert.   Fluent speech and language.  Oriented to place and time.  Recent and remote memory mildly diminished. Attention span, concentration and fund of knowledge mostly appropriate during visit with wife providing majority of history.  Mood and affect appropriate.   MMSE - Mini Mental State Exam 02/01/2021 06/21/2019 11/18/2018  Orientation to time 4 4 5   Orientation to Place 5 5 5   Registration 3 3 3   Attention/ Calculation 1 1 3   Recall 2 1 1   Language- name 2 objects 2 2 2   Language- repeat 1 1 1   Language- follow 3 step command 3 3 3   Language- read & follow direction 1 1 1   Write a sentence 1 1 1   Copy design 0 1 1  Copy design-comments - - named 7 animals  Total score 23 23 26    Cranial Nerves: Pupils equal, briskly reactive to light. Extraocular movements full without nystagmus. Visual fields full to confrontation. Hearing intact. Facial sensation intact. Face, tongue, palate moves normally and symmetrically.  Motor: Normal bulk and tone. Normal strength in all tested extremity muscles. Sensory.: intact to touch , pinprick , position and vibratory sensation.  Coordination: Rapid alternating movements normal in all extremities. Finger-to-nose and heel-to-shin  performed accurately bilaterally.  Gait and Station: Stands from seated position without difficulty.  Stance is normal.  Gait demonstrates normal stride length and balance without use of assistive device Reflexes: 1+ and symmetric. Toes downgoing.        ASSESSMENT: Talha Iser is a  75 y.o. year old male here with left frontal punctate infarcts and micro-bleeds as well as left hemisphere ICH with hemosiderosis consistent with CAA on 08/31/2018. Vascular risk factors include HTN, HLD, OSA, MCI, cardiomyopathy.      PLAN:  L frontal stroke:  Residual cognitive impairment - stable.  MMSE 23/30 (prior 23/30 06/2019).  Discussed importance of managing stroke risk factors, routine physical exercise and activity, healthy diet and adequate sleep as well as memory exercises to help prevent/slow worsening.   continue aspirin 81 mg daily  and pravastatin for secondary stroke prevention.  Discussed secondary stroke prevention measures and importance of close PCP follow-up for aggressive stroke risk factor management L ICH with hemosiderosis consistent with CAA: Stable without new or worsening stroke/TIA symptoms.  Discussed importance of managing stroke risk factors as well as importance of adequate blood pressure control. HTN: BP goal<130/90.  Stable on current regimen per PCP HLD: LDL goal<70.  Prior LDL 73 (05/2020) on pravastatin per PCP. Anxiety, depression: Managed by psychiatry Shin numbness, left: Unknown etiology. Not persistent and no associated pain. Continue to monitor   Follow-up in 1 year or call earlier if needed   I spent 33 minutes of face-to-face and non-face-to-face time with patient and wife.  This included previsit chart review, lab review, study review, electronic health record documentation, patient and wife education and discussion regarding prior stroke with residual deficits, completion and review of MMSE, importance of managing stroke risk factors and secondary stroke  prevention measures and answered all other questions to patient satisfaction   Frann Rider, [facility]  [facility] [address] [place], [place] [address]  Phone [phone] Fax [phone] Note: This document was prepared with digital dictation and possible smart phrase technology. Any transcriptional errors that result from this process are unintentional.

## 2021-02-16 DIAGNOSIS — K219 Gastro-esophageal reflux disease without esophagitis: Secondary | ICD-10-CM | POA: Diagnosis not present

## 2021-02-16 DIAGNOSIS — H04123 Dry eye syndrome of bilateral lacrimal glands: Secondary | ICD-10-CM | POA: Diagnosis not present

## 2021-02-16 DIAGNOSIS — H25041 Posterior subcapsular polar age-related cataract, right eye: Secondary | ICD-10-CM | POA: Diagnosis not present

## 2021-02-16 DIAGNOSIS — H52223 Regular astigmatism, bilateral: Secondary | ICD-10-CM | POA: Diagnosis not present

## 2021-02-16 DIAGNOSIS — G4733 Obstructive sleep apnea (adult) (pediatric): Secondary | ICD-10-CM | POA: Diagnosis not present

## 2021-02-16 DIAGNOSIS — H2513 Age-related nuclear cataract, bilateral: Secondary | ICD-10-CM | POA: Diagnosis not present

## 2021-02-16 DIAGNOSIS — H5211 Myopia, right eye: Secondary | ICD-10-CM | POA: Diagnosis not present

## 2021-02-16 DIAGNOSIS — H25013 Cortical age-related cataract, bilateral: Secondary | ICD-10-CM | POA: Diagnosis not present

## 2021-02-26 DIAGNOSIS — E782 Mixed hyperlipidemia: Secondary | ICD-10-CM | POA: Diagnosis not present

## 2021-02-26 DIAGNOSIS — J449 Chronic obstructive pulmonary disease, unspecified: Secondary | ICD-10-CM | POA: Diagnosis not present

## 2021-02-26 DIAGNOSIS — I5181 Takotsubo syndrome: Secondary | ICD-10-CM | POA: Diagnosis not present

## 2021-02-26 DIAGNOSIS — R7302 Impaired glucose tolerance (oral): Secondary | ICD-10-CM | POA: Diagnosis not present

## 2021-02-26 DIAGNOSIS — I680 Cerebral amyloid angiopathy: Secondary | ICD-10-CM | POA: Diagnosis not present

## 2021-02-26 DIAGNOSIS — F320 Major depressive disorder, single episode, mild: Secondary | ICD-10-CM | POA: Diagnosis not present

## 2021-02-26 DIAGNOSIS — E559 Vitamin D deficiency, unspecified: Secondary | ICD-10-CM | POA: Diagnosis not present

## 2021-02-26 DIAGNOSIS — Z23 Encounter for immunization: Secondary | ICD-10-CM | POA: Diagnosis not present

## 2021-02-26 DIAGNOSIS — N400 Enlarged prostate without lower urinary tract symptoms: Secondary | ICD-10-CM | POA: Diagnosis not present

## 2021-02-26 DIAGNOSIS — E854 Organ-limited amyloidosis: Secondary | ICD-10-CM | POA: Diagnosis not present

## 2021-02-26 DIAGNOSIS — E291 Testicular hypofunction: Secondary | ICD-10-CM | POA: Diagnosis not present

## 2021-02-26 DIAGNOSIS — I110 Hypertensive heart disease with heart failure: Secondary | ICD-10-CM | POA: Diagnosis not present

## 2021-02-26 DIAGNOSIS — I610 Nontraumatic intracerebral hemorrhage in hemisphere, subcortical: Secondary | ICD-10-CM | POA: Diagnosis not present

## 2021-04-06 DIAGNOSIS — H524 Presbyopia: Secondary | ICD-10-CM | POA: Diagnosis not present

## 2021-04-06 DIAGNOSIS — G4733 Obstructive sleep apnea (adult) (pediatric): Secondary | ICD-10-CM | POA: Diagnosis not present

## 2021-04-06 DIAGNOSIS — H25011 Cortical age-related cataract, right eye: Secondary | ICD-10-CM | POA: Diagnosis not present

## 2021-04-06 DIAGNOSIS — H2513 Age-related nuclear cataract, bilateral: Secondary | ICD-10-CM | POA: Diagnosis not present

## 2021-04-06 DIAGNOSIS — H52223 Regular astigmatism, bilateral: Secondary | ICD-10-CM | POA: Diagnosis not present

## 2021-04-06 DIAGNOSIS — K219 Gastro-esophageal reflux disease without esophagitis: Secondary | ICD-10-CM | POA: Diagnosis not present

## 2021-04-06 DIAGNOSIS — H5211 Myopia, right eye: Secondary | ICD-10-CM | POA: Diagnosis not present

## 2021-04-06 DIAGNOSIS — H25041 Posterior subcapsular polar age-related cataract, right eye: Secondary | ICD-10-CM | POA: Diagnosis not present

## 2021-04-06 DIAGNOSIS — H25013 Cortical age-related cataract, bilateral: Secondary | ICD-10-CM | POA: Diagnosis not present

## 2021-04-06 DIAGNOSIS — H2511 Age-related nuclear cataract, right eye: Secondary | ICD-10-CM | POA: Diagnosis not present

## 2021-04-11 DIAGNOSIS — F6381 Intermittent explosive disorder: Secondary | ICD-10-CM | POA: Diagnosis not present

## 2021-04-11 DIAGNOSIS — F4312 Post-traumatic stress disorder, chronic: Secondary | ICD-10-CM | POA: Diagnosis not present

## 2021-04-11 DIAGNOSIS — F331 Major depressive disorder, recurrent, moderate: Secondary | ICD-10-CM | POA: Diagnosis not present

## 2021-09-25 ENCOUNTER — Other Ambulatory Visit: Payer: Self-pay | Admitting: Adult Health

## 2021-09-28 NOTE — Telephone Encounter (Signed)
Spoke with patient's wife and she states that patient sees another doctor and she will call him to get it refilled- they were unaware that the request came here. Nothing further needed.

## 2021-09-28 NOTE — Telephone Encounter (Signed)
Please call to make a follow up for this patient with MR\ first available?

## 2021-12-12 NOTE — Progress Notes (Signed)
Office Visit    Patient Name: Jeremiah [name] Date of Encounter: 12/14/2021  Primary Care Provider:  Selena Lesser, MD Primary Cardiologist:  Sanda Klein, MD Primary Electrophysiologist: None  Chief Complaint    Jeremiah [name] is a 76 y.o. male with PMH of HLD, HTN, OSA, PTSD,Takotsubo cardiomyopathy, ICA, COPD who presents today for follow-up of shortness of breath.  Past Medical History    Past Medical History:  Diagnosis Date   COPD (chronic obstructive pulmonary disease) ([place])    Depression    Dilated cardiomyopathy (HCC)    Hypertension    PTSD (post-traumatic stress disorder)    Sleep apnea    Past Surgical History:  Procedure Laterality Date   HAMMER TOE SURGERY  April 2017    Allergies  No Known Allergies  History of Present Illness    Jeremiah [name] is a 76 year old male with the above-mentioned past medical history who presents today for 70-monthfollow-up of shortness of breath.  Mr. CLasterhas history of intracranial bleed with segmental PE.  He was admitted in 08/2018 and required intubation during hospitalization due to COPD exacerbation.  His troponins were elevated and initial EKG showed J-point elevation diffusely. 2D echo was completed that showed severely reduced EF of 25% that was consistent with Takotsubo cardiomyopathy, grade 2 DD. Follow-up 2D echo in 09/2018 showed normalized EF of 60-65% and grade 1 DD.    He was last seen by [name] on 11/2020 for follow-up.  During visit patient stated he was doing well without complaints of shortness of breath.  His Lasix was changed to as needed and he continued on BiDil for blood pressure control.   Mr. CHritzpresents today for follow-up with his wife.  Since last being seen in the office patient reports he has been doing well with exception of shortness of breath that presents with increased exertion.  He is noticed over the past 2 weeks that he has been coughing more at night and is having  difficulty laying flat with more than 1 pillow.  He denies any fevers or chills and blood pressure today was slightly elevated at 140/80.  He is compliant with his current medication regimen and has not taken as needed Lasix during recent bout of shortness of breath.  Patient denies chest pain, palpitations, dyspnea, PND, orthopnea, nausea, vomiting, dizziness, syncope, edema, weight gain, or early satiety.  Home Medications    Current Outpatient Medications  Medication Sig Dispense Refill   acetaminophen (TYLENOL) 325 MG tablet Take 2 tablets (650 mg total) by mouth every 6 (six) hours as needed for mild pain.     albuterol (VENTOLIN HFA) 108 (90 Base) MCG/ACT inhaler Inhale 2 puffs into the lungs every 4 (four) hours as needed for wheezing or shortness of breath (((PLAN B))). 8 g 5   aspirin EC 81 MG tablet Take 81 mg by mouth daily.     carbamazepine (TEGRETOL) 200 MG tablet Take 200 mg by mouth 2 (two) times daily.     esomeprazole (NEXIUM) 40 MG capsule Take 1 capsule (40 mg total) by mouth daily before breakfast. 30 capsule 2   finasteride (PROSCAR) 5 MG tablet Take 1 tablet by mouth daily.     furosemide (LASIX) 20 MG tablet Take 1 tablet (20 mg total) by mouth daily. 90 tablet 3   isosorbide-hydrALAZINE (BIDIL) 20-37.5 MG tablet Take 1 tablet by mouth 3 (three) times daily. 90 tablet 0   Multiple Vitamin (MULTIVITAMIN WITH MINERALS) TABS tablet  Take 1 tablet by mouth daily. 30 tablet 0   NIFEdipine (PROCARDIA-XL/NIFEDICAL-XL) 30 MG 24 hr tablet Take by mouth.     oxybutynin (DITROPAN) 5 MG tablet Take 5 mg by mouth 3 (three) times daily.     pravastatin (PRAVACHOL) 10 MG tablet Take 1 tablet (10 mg total) by mouth daily. 30 tablet 0   QUEtiapine (SEROQUEL) 50 MG tablet Take 1 tablet (50 mg total) by mouth at bedtime. 30 tablet 0   tamsulosin (FLOMAX) 0.4 MG CAPS capsule Take 0.4 mg by mouth daily.     TRELEGY ELLIPTA 100-62.5-25 MCG/ACT AEPB Inhale 1 puff into the lungs daily.      triamcinolone ointment (KENALOG) 0.1 % Apply topically 2 (two) times daily.     VITAMIN D PO Take 600 mg by mouth daily.     No current facility-administered medications for this visit.     Review of Systems  Please see the history of present illness.    (+) Shortness of breath (+) Fatigue  All other systems reviewed and are otherwise negative except as noted above.  Physical Exam    Wt Readings from Last 3 Encounters:  12/14/21 160 lb 3.2 oz (72.7 kg)  02/01/21 160 lb (72.6 kg)  11/16/20 156 lb 6.4 oz (70.9 kg)   VS: Vitals:   12/14/21 1019  BP: (!) 140/80  Pulse: 70  SpO2: 95%  ,Body mass index is 20.57 kg/m.  Constitutional:      Appearance: Healthy appearance. Not in distress.  Neck:     Vascular: JVD normal.  Pulmonary:     Effort: Pulmonary effort is normal.     Breath sounds: Bilateral wheezing. No rales. Diminished in the bases Cardiovascular:     Normal rate. Regular rhythm. Normal S1. Normal S2.      Murmurs: There is no murmur.  Edema:    Peripheral edema absent.  Abdominal:     Palpations: Abdomen is soft non tender. There is no hepatomegaly.  Skin:    General: Skin is warm and dry.  Neurological:     General: No focal deficit present.     Mental Status: Alert and oriented to person, place and time.     Cranial Nerves: Cranial nerves are intact.  EKG/LABS/Other Studies Reviewed    ECG personally reviewed by me today -sinus rhythm with rate of 70 bpm with normal axis and no acute changes noted  Lab Results  Component Value Date   WBC 8.5 09/22/2018   HGB 11.7 (L) 09/22/2018   HCT 35.4 (L) 09/22/2018   MCV 91.2 09/22/2018   PLT 349 09/22/2018   Lab Results  Component Value Date   CREATININE 1.15 06/21/2019   BUN 13 06/21/2019   NA 144 06/21/2019   K 4.5 06/21/2019   CL 106 06/21/2019   CO2 24 06/21/2019   Lab Results  Component Value Date   ALT 14 09/22/2018   AST 20 09/22/2018   ALKPHOS 63 09/22/2018   BILITOT 0.7 09/22/2018    Lab Results  Component Value Date   CHOL 159 08/31/2018   HDL 66 08/31/2018   LDLCALC 83 08/31/2018   TRIG 76 09/06/2018   CHOLHDL 2.4 08/31/2018    Lab Results  Component Value Date   HGBA1C 5.6 08/31/2018    Assessment & Plan    1.  Dilated cardiomyopathy: -History of Takotsubo cardiomyopathy -2D echo in 09/2018 showed normalized EF of 60-65% and grade 1 DD. -Patient reports today that he has not experienced  any chest pain since previous appointment -Continue ASA 81 mg and Pravachol 10 mg  2.  HTN: -Blood pressure today was slightly elevated at 140/80 and was 128/84 on recheck -Continue current antihypertensive regimen as noted above  3.  HLD: -Patient's last LDL cholesterol was well controlled at 68 and less than goal of 70 -Continue Pravachol as noted above  4.  HFpEF/ SOB with exertion -Patient has noticed increased shortness of breath with exertion over the past 2 weeks -We will complete exercise stress echo to evaluate heart structure and to eliminate possibility of ischemia -Lasix 20 mg changed from as needed to daily -BMET in 1 week -Ambulatory referral to pulmonology for management of COPD  5.  History of intracranial bleed: -Patient currently followed by neurology  6.  COPD: -Today patient endorsed increased shortness of breath with exertion and is currently not followed by pulmonology -Ambulatory referral to pulmonology as mentioned above  Disposition: Follow-up with Sanda Klein, MD or APP in 4 weeks  Shared Decision Making/Informed Consent The risks [chest pain, shortness of breath, cardiac arrhythmias, dizziness, blood pressure fluctuations, myocardial infarction, stroke/transient ischemic attack, and life-threatening complications (estimated to be 1 in 10,000)], benefits (risk stratification, diagnosing coronary artery disease, treatment guidance) and alternatives of a stress or dobutamine stress echocardiogram were discussed in detail with Mr. Corrales  and he agrees to proceed.   Medication Adjustments/Labs and Tests Ordered: Current medicines are reviewed at length with the patient today.  Concerns regarding medicines are outlined above.   Signed, Mable Fill, Marissa Nestle, NP 12/14/2021, 12:08 PM [place]

## 2021-12-14 ENCOUNTER — Encounter: Payer: Self-pay | Admitting: Nurse Practitioner

## 2021-12-14 ENCOUNTER — Ambulatory Visit: Payer: Medicare Other | Attending: Nurse Practitioner | Admitting: Nurse Practitioner

## 2021-12-14 VITALS — BP 140/80 | HR 70 | Ht 74.0 in | Wt 160.2 lb

## 2021-12-14 DIAGNOSIS — I10 Essential (primary) hypertension: Secondary | ICD-10-CM | POA: Diagnosis present

## 2021-12-14 DIAGNOSIS — E785 Hyperlipidemia, unspecified: Secondary | ICD-10-CM

## 2021-12-14 DIAGNOSIS — J441 Chronic obstructive pulmonary disease with (acute) exacerbation: Secondary | ICD-10-CM | POA: Diagnosis present

## 2021-12-14 DIAGNOSIS — J439 Emphysema, unspecified: Secondary | ICD-10-CM

## 2021-12-14 DIAGNOSIS — I5181 Takotsubo syndrome: Secondary | ICD-10-CM

## 2021-12-14 DIAGNOSIS — I5030 Unspecified diastolic (congestive) heart failure: Secondary | ICD-10-CM | POA: Diagnosis present

## 2021-12-14 MED ORDER — FUROSEMIDE 20 MG PO TABS: 20.0000 mg | ORAL_TABLET | Freq: Every day | ORAL | 3 refills | Status: AC

## 2021-12-14 NOTE — Patient Instructions (Signed)
Medication Instructions:  Your physician has recommended you make the following change in your medication:  Take lasix 20 mg daily  *If you need a refill on your cardiac medications before your next appointment, please call your pharmacy*   Lab Work: BNP today  BMET 1 week   If you have labs (blood work) drawn today and your tests are completely normal, you will receive your results only by: [employer] (if you have MyChart) OR A paper copy in the mail If you have any lab test that is abnormal or we need to change your treatment, we will call you to review the results.   Testing/Procedures: Your physician has requested that you have a stress echocardiogram. For further information please visit [email or web address]. Please follow instruction sheet as given.    Follow-Up: At [facility], you and your health needs are our priority.  As part of our continuing mission to provide you with exceptional heart care, we have created designated Provider Care Teams.  These Care Teams include your primary Cardiologist (physician) and Advanced Practice Providers (APPs -  Physician Assistants and Nurse Practitioners) who all work together to provide you with the care you need, when you need it.    Your next appointment:   4 week(s)  The format for your next appointment:   In Person  Provider:   Ambrose Pancoast, NP

## 2021-12-15 LAB — PRO B NATRIURETIC PEPTIDE: NT-Pro BNP: <36 pg/mL (ref 0–376)

## 2021-12-21 ENCOUNTER — Ambulatory Visit: Payer: Medicare Other | Attending: Nurse Practitioner

## 2021-12-21 ENCOUNTER — Other Ambulatory Visit: Payer: Medicare Other

## 2022-01-01 ENCOUNTER — Encounter: Payer: Self-pay | Admitting: Adult Health

## 2022-01-01 ENCOUNTER — Ambulatory Visit (INDEPENDENT_AMBULATORY_CARE_PROVIDER_SITE_OTHER): Payer: Medicare Other | Admitting: Adult Health

## 2022-01-01 ENCOUNTER — Ambulatory Visit (INDEPENDENT_AMBULATORY_CARE_PROVIDER_SITE_OTHER): Payer: Medicare Other

## 2022-01-01 VITALS — BP 140/80 | HR 80 | Ht 74.0 in | Wt 159.0 lb

## 2022-01-01 DIAGNOSIS — J449 Chronic obstructive pulmonary disease, unspecified: Secondary | ICD-10-CM

## 2022-01-01 DIAGNOSIS — Z23 Encounter for immunization: Secondary | ICD-10-CM

## 2022-01-01 DIAGNOSIS — R0602 Shortness of breath: Secondary | ICD-10-CM | POA: Diagnosis not present

## 2022-01-01 DIAGNOSIS — R053 Chronic cough: Secondary | ICD-10-CM

## 2022-01-01 MED ORDER — TRELEGY ELLIPTA 100-62.5-25 MCG/ACT IN AEPB: 1.0000 | INHALATION_SPRAY | Freq: Every day | RESPIRATORY_TRACT | 6 refills | Status: AC

## 2022-01-01 NOTE — Assessment & Plan Note (Signed)
Severe COPD currently maintained on present regimen. Continue on Trelegy.  We discussed if we need to change back to [facility] for financial reasons for now they want to stay on Trelegy. Continue with activity as tolerated. Chest x-ray today.  Plan  Patient Instructions  Continue on TRELEGY  daily . Brush/rinse after use.  Continue on Mucinex DM Twice daily.  Activity as tolerated.  Chest xray today .  Flu shot today.  Follow up with Dr. Chase Caller or Mateo Overbeck in 4 months and  and As needed   Please contact office for sooner follow up if symptoms do not improve or worsen or seek emergency care

## 2022-01-01 NOTE — Patient Instructions (Signed)
Continue on TRELEGY  daily . Brush/rinse after use.  Continue on Mucinex DM Twice daily.  Activity as tolerated.  Chest xray today .  Flu shot today.  Follow up with Dr. Chase Caller or Zareya Tuckett in 4 months and  and As needed   Please contact office for sooner follow up if symptoms do not improve or worsen or seek emergency care

## 2022-01-01 NOTE — Progress Notes (Signed)
$'@Patient'f$  ID: Jeremiah [name], male    DOB: 1945-09-01, 76 y.o.   MRN: [id number]  Chief Complaint  Patient presents with   Follow-up    Referring provider: Selena Lesser, MD  HPI: 76 year old male former smoker followed for severe COPD. History of prolonged critical illness in May 2020 with COPD exacerbation with vent dependent respiratory failure and PE, left intracranial frontal lobe hemorrhage and stress-induced cardiomyopathy Medical history significant for cognitive impairment/dementia, hypertension and hyperlipidemia  TEST/EVENTS :  PFTs 01/24/2014  FEV1  1.30 (35%) ratio 49 with severe air trapping and no better p saba and dlco 48% -- referred to rehab 01/24/14 -completed -Med calendar 02/21/2014 > did not recognize it when reprinted 03/29/14 > reviewed , 08/03/2014 , 04/28/2015   CT chest 02/2016 no ILD , emphysema changes  -Prolonged illness 08/2018 with COPD ,VDRF , PE (not on anticoagulation due to [place]) , Cardiomyopathy, and Intracranial hemorrhage  2 D echo 08/2018 EF 20-25% -?Taksubo CM, mod RV dysfunction  V Doppler neg for DVT  Echo 09/2018 , EF 60%.  CT head -5 mm acute subcortical hemorrhage in the left frontal lobe. Extensive chronic white matter disease, question amyloid angiopathy given this location. CT chest -Single subsegmental pulmonary embolism to the left lower lobe. Emphysema   2D echo September 15, 2018 EF at 60-65%  01/01/2022 Follow up ; COPD  Patient presents for follow-up visit.  Last seen October 2020.  Patient has underlying severe COPD. Says overall is doing okay since last visit .  Gets winded with heavy activity or prolonged walking. Gets medical care through [facility]. Remains on Trelegy . Tried Breztri in past did not feel it worked as well. [facility] will not [facility] Trelegy . They pay $100 month for Trelegy.  Anxiety seems to cause some breathing issues. Has memory impairment .  Uses Albuterol usually .  Stays fairly active.  Denies any flare of cough or  wheezing. Patient is accompanied by his wife  member who helps with his care.  Is not on oxygen.  O2 sats today in the office are at 95% on room air.   No Known Allergies  Immunization History  Administered Date(s) Administered   Fluad Quad(high Dose 65+) 01/26/2020, 02/26/2021, 01/01/2022   Influenza Split 01/13/2013   Influenza, High Dose Seasonal PF 12/28/2014, 01/15/2016, 01/13/2017, 01/16/2018, 01/11/2019   Influenza, Seasonal, Injecte, Preservative Fre 04/13/2009, 03/27/2010, 02/21/2011, 02/11/2012, 01/19/2013   Influenza,inj,Quad PF,6+ Mos 01/24/2014   Influenza-Unspecified 01/13/2005, 02/20/2006, 03/05/2007, 03/27/2010, 02/13/2014, 01/30/2015, 01/30/2016, 02/14/2019, 01/13/2021   PFIZER(Purple Top)SARS-COV-2 Vaccination 06/03/2019, 06/24/2019, 02/12/2020   Pneumococcal Conjugate-13 07/15/2013, 12/08/2017   Pneumococcal Polysaccharide-23 05/20/2005, 08/19/2014   Pneumococcal-Unspecified 04/15/2010   Td (Adult), 2 Lf Tetanus Toxid, Preservative Free 07/14/2004   Tdap 05/21/2011, 12/28/2014   Zoster Recombinat (Shingrix) 08/24/2019, 11/30/2019   Zoster, Live 08/20/2011    Past Medical History:  Diagnosis Date   COPD (chronic obstructive pulmonary disease) ([place])    Depression    Dilated cardiomyopathy ([place])    Hypertension    PTSD (post-traumatic stress disorder)    Sleep apnea     Tobacco History: Social History   Tobacco Use  Smoking Status Former   Packs/day: 1.00   Years: 30.00   Total pack years: 30.00   Types: Cigarettes   Quit date: 04/16/1987   Years since quitting: 34.7  Smokeless Tobacco Never   Counseling given: Not Answered   Outpatient Medications Prior to Visit  Medication Sig Dispense Refill   albuterol (VENTOLIN HFA) 108 (  90 Base) MCG/ACT inhaler Inhale 2 puffs into the lungs every 4 (four) hours as needed for wheezing or shortness of breath (((PLAN B))). 8 g 5   TRELEGY ELLIPTA 100-62.5-25 MCG/ACT AEPB Inhale 1 puff into the lungs daily.      acetaminophen (TYLENOL) 325 MG tablet Take 2 tablets (650 mg total) by mouth every 6 (six) hours as needed for mild pain.     aspirin EC 81 MG tablet Take 81 mg by mouth daily.     carbamazepine (TEGRETOL) 200 MG tablet Take 200 mg by mouth 2 (two) times daily.     donepezil (ARICEPT) 5 MG tablet Take 5 mg by mouth at bedtime.     esomeprazole (NEXIUM) 40 MG capsule Take 1 capsule (40 mg total) by mouth daily before breakfast. 30 capsule 2   finasteride (PROSCAR) 5 MG tablet Take 1 tablet by mouth daily.     furosemide (LASIX) 20 MG tablet Take 1 tablet (20 mg total) by mouth daily. 90 tablet 3   isosorbide-hydrALAZINE (BIDIL) 20-37.5 MG tablet Take 1 tablet by mouth 3 (three) times daily. 90 tablet 0   Multiple Vitamin (MULTIVITAMIN WITH MINERALS) TABS tablet Take 1 tablet by mouth daily. 30 tablet 0   NIFEdipine (PROCARDIA-XL/NIFEDICAL-XL) 30 MG 24 hr tablet Take by mouth.     oxybutynin (DITROPAN) 5 MG tablet Take 5 mg by mouth 3 (three) times daily.     pravastatin (PRAVACHOL) 10 MG tablet Take 1 tablet (10 mg total) by mouth daily. 30 tablet 0   QUEtiapine (SEROQUEL) 50 MG tablet Take 1 tablet (50 mg total) by mouth at bedtime. 30 tablet 0   tamsulosin (FLOMAX) 0.4 MG CAPS capsule Take 0.4 mg by mouth daily.     triamcinolone ointment (KENALOG) 0.1 % Apply topically 2 (two) times daily.     VITAMIN D PO Take 600 mg by mouth daily.     No facility-administered medications prior to visit.     Review of Systems:   Constitutional:   No  weight loss, night sweats,  Fevers, chills,  +fatigue, or  lassitude.  HEENT:   No headaches,  Difficulty swallowing,  Tooth/dental problems, or  Sore throat,                No sneezing, itching, ear ache, nasal congestion, post nasal drip,   CV:  No chest pain,  Orthopnea, PND, swelling in lower extremities, anasarca, dizziness, palpitations, syncope.   GI  No heartburn, indigestion, abdominal pain, nausea, vomiting, diarrhea, change in bowel habits,  loss of appetite, bloody stools.   Resp:  No non-productive cough,  No coughing up of blood.  No change in color of mucus.  No wheezing.  No chest wall deformity  Skin: no rash or lesions.  GU: no dysuria, change in color of urine, no urgency or frequency.  No flank pain, no hematuria   MS:  No joint pain or swelling.  No decreased range of motion.  No back pain.    Physical Exam  BP (!) 140/80 (BP Location: Left Arm)   Pulse 80   Ht '6\' 2"'$  (1.88 m)   Wt 159 lb (72.1 kg)   SpO2 95%   BMI 20.41 kg/m   GEN: A/Ox3; pleasant , NAD, well nourished    HEENT:  [place]/AT,  NOSE-clear, THROAT-clear, no lesions, no postnasal drip or exudate noted.   NECK:  Supple w/ fair ROM; no JVD; normal carotid impulses w/o bruits; no thyromegaly or nodules palpated; no  lymphadenopathy.    RESP  Clear  P & A; w/o, wheezes/ rales/ or rhonchi. no accessory muscle use, no dullness to percussion  CARD:  RRR, no m/r/g, no peripheral edema, pulses intact, no cyanosis or clubbing.  GI:   Soft & nt; nml bowel sounds; no organomegaly or masses detected.   Musco: Warm bil, no deformities or joint swelling noted.   Neuro: alert, no focal deficits noted.    Skin: Warm, no lesions or rashes      BMET       Imaging: No results found.       Latest Ref Rng & Units 03/22/2016    9:59 AM 01/24/2014   10:51 AM  PFT Results  FVC-Pre L 3.33  2.66   FVC-Predicted Pre % 79  54   FVC-Post L 3.53  2.75   FVC-Predicted Post % 84  56   Pre FEV1/FVC % % 55  49   Post FEV1/FCV % % 51  46   FEV1-Pre L 1.82  1.30   FEV1-Predicted Pre % 57  35   FEV1-Post L 1.79  1.26   DLCO uncorrected ml/min/mmHg  17.16   DLCO UNC% %  48   DLVA Predicted %  60   TLC L  11.28   TLC % Predicted %  151   RV % Predicted %  306     No results found for: "NITRICOXIDE"      Assessment & Plan:   COPD (chronic obstructive pulmonary disease) (HCC) Severe COPD currently maintained on present regimen. Continue on  Trelegy.  We discussed if we need to change back to [facility] for financial reasons for now they want to stay on Trelegy. Continue with activity as tolerated. Chest x-ray today.  Plan  Patient Instructions  Continue on TRELEGY  daily . Brush/rinse after use.  Continue on Mucinex DM Twice daily.  Activity as tolerated.  Chest xray today .  Flu shot today.  Follow up with Dr. Chase Caller or Aadhav Uhlig in 4 months and  and As needed   Please contact office for sooner follow up if symptoms do not improve or worsen or seek emergency care         I spent   30 minutes dedicated to the care of this patient on the date of this encounter to include pre-visit review of records, face-to-face time with the patient discussing conditions above, post visit ordering of testing, clinical documentation with the electronic health record, making appropriate referrals as documented, and communicating necessary findings to members of the patients care team.    Rexene Edison, NP 01/01/2022

## 2022-01-03 ENCOUNTER — Other Ambulatory Visit: Payer: Self-pay | Admitting: *Deleted

## 2022-01-03 DIAGNOSIS — R9389 Abnormal findings on diagnostic imaging of other specified body structures: Secondary | ICD-10-CM

## 2022-01-03 NOTE — Progress Notes (Signed)
Order placed for CT chest with no contrast.  Nothing further needed.

## 2022-01-10 ENCOUNTER — Telehealth (HOSPITAL_COMMUNITY): Payer: Self-pay

## 2022-01-10 NOTE — Telephone Encounter (Signed)
Detailed instructions left on the patient's answering machine. Asked to call back with any questions. S.Gabriela Giannelli EMTP 

## 2022-01-11 ENCOUNTER — Telehealth: Payer: Self-pay | Admitting: Adult Health

## 2022-01-11 NOTE — Telephone Encounter (Signed)
Attempted to speak to pt, [facility] insurance informed me via phone that this pt's coverage termed as of 04/2014. I informed the pt and she was very upset, I called [employer] back and another rep told me the plan was active. Was unable to inform pt via phone. Will try to call pt back at a later time.

## 2022-01-15 ENCOUNTER — Ambulatory Visit (HOSPITAL_COMMUNITY): Payer: Medicare Other

## 2022-01-15 ENCOUNTER — Ambulatory Visit (HOSPITAL_COMMUNITY): Payer: Medicare Other | Attending: Nurse Practitioner

## 2022-01-15 ENCOUNTER — Ambulatory Visit: Payer: Medicare Other | Admitting: Nurse Practitioner

## 2022-01-15 DIAGNOSIS — E785 Hyperlipidemia, unspecified: Secondary | ICD-10-CM | POA: Diagnosis present

## 2022-01-15 DIAGNOSIS — I10 Essential (primary) hypertension: Secondary | ICD-10-CM | POA: Insufficient documentation

## 2022-01-15 DIAGNOSIS — R0602 Shortness of breath: Secondary | ICD-10-CM | POA: Diagnosis not present

## 2022-01-15 DIAGNOSIS — I5181 Takotsubo syndrome: Secondary | ICD-10-CM | POA: Insufficient documentation

## 2022-01-15 LAB — ECHOCARDIOGRAM STRESS TEST
Area-P 1/2: 2.84 cm2
P 1/2 time: 799 msec
S' Lateral: 3 cm

## 2022-01-15 MED ORDER — PERFLUTREN LIPID MICROSPHERE
1.0000 mL | INTRAVENOUS | Status: AC | PRN
  Administered 2022-01-15: 3 mL via INTRAVENOUS

## 2022-01-16 ENCOUNTER — Other Ambulatory Visit: Payer: Medicare Other

## 2022-01-16 ENCOUNTER — Ambulatory Visit
Admission: RE | Admit: 2022-01-16 | Discharge: 2022-01-16 | Disposition: A | Discharge status: Home / Self Care | Payer: Medicare Other | Source: Ambulatory Visit | Attending: Adult Health | Admitting: Adult Health

## 2022-01-16 DIAGNOSIS — R9389 Abnormal findings on diagnostic imaging of other specified body structures: Secondary | ICD-10-CM

## 2022-01-17 ENCOUNTER — Telehealth: Payer: Self-pay | Admitting: Adult Health

## 2022-01-17 NOTE — Telephone Encounter (Signed)
LVM and sent mychart msg informing pt of need to reschedule 10/26 appointment - NP out

## 2022-01-21 ENCOUNTER — Telehealth: Payer: Self-pay | Admitting: Adult Health

## 2022-01-21 NOTE — Telephone Encounter (Signed)
Patient has had a follow up CT Chest. No further medication needed. See read below. Can follow up with TP as scheduled.   1. No acute findings in the chest. Specifically, no suspicious pulmonary nodule or mass as questioned on recent chest x-ray.

## 2022-01-21 NOTE — Telephone Encounter (Signed)
I called the wife back and she is stating that the patient would need to be on antibiotics.   IMPRESSION: Heterogeneous opacities within the mid lungs bilaterally. While this may potentially be secondary to overlapping structures, underlying pulmonary opacities should be considered. Underlying pneumonia not excluded. Followup PA and lateral chest X-ray is recommended in 3-4 weeks following trial of antibiotic therapy to ensure resolution and exclude underlying malignancy. I believe this it what the patient referring to as an antibiotic.   I have read the last note from [place] and she is still wanting to  know if the patient will need other medication. Please advise.

## 2022-01-22 NOTE — Telephone Encounter (Signed)
Called and spoke with [place]. She verbalized understanding.   Nothing further needed.

## 2022-01-22 NOTE — Progress Notes (Signed)
Office Visit    Patient Name: Jeremiah [name] Date of Encounter: 01/24/2022  Primary Care Provider:  Selena Lesser, MD Primary Cardiologist:  Sanda Klein, MD Primary Electrophysiologist: None  Chief Complaint    Jeremiah [name] is a 76 y.o. male with PMH of HLD, HTN, OSA, PTSD,Takotsubo cardiomyopathy, ICA, COPD who presents today for follow-up of shortness of breath.  Past Medical History    Past Medical History:  Diagnosis Date   COPD (chronic obstructive pulmonary disease) ([place])    Depression    Dilated cardiomyopathy (HCC)    Hypertension    PTSD (post-traumatic stress disorder)    Sleep apnea    Past Surgical History:  Procedure Laterality Date   HAMMER TOE SURGERY  April 2017    Allergies  Allergies  Allergen Reactions   Sildenafil     Other reaction(s): Syncope    History of Present Illness    Jeremiah [name] is a 76 year old male with the above-mentioned past medical history who presents today for 15-monthfollow-up of shortness of breath.  Mr. CSonnhas history of intracranial bleed with segmental PE.  He was admitted in 08/2018 and required intubation during hospitalization due to COPD exacerbation.  His troponins were elevated and initial EKG showed J-point elevation diffusely. 2D echo was completed that showed severely reduced EF of 25% that was consistent with Takotsubo cardiomyopathy, grade 2 DD. Follow-up 2D echo in 09/2018 showed normalized EF of 60-65% and grade 1 DD.  He was last seen by Jeremiah [name] Presume PA on 11/2020 for follow-up.  During visit patient stated he was doing well without complaints of shortness of breath.  His Lasix was changed to as needed and he continued on BiDil for blood pressure control.  He was seen on 12/14/2021 for complaint of shortness of breath.  During her visit patient stated shortness of breath with increased exertion.  Patient's blood pressures were slightly elevated 140/80 during visit with improvement on recheck of 128/84.  Patient was sent for exercise stress echo to evaluate structures and to eliminate possibility of ischemia. He was also referred to pulmonology for management of COPD.  Patient stress echo results revealed no evidence of ischemia or wall motion abnormalities.  Mr. CAnkneypresents today with his wife for 1 month follow-up.  Since last being seen in the office patient reports he has been doing well with no cardiac complaints or concerns.  He was recently seen by pulmonology after being referred and had CT scan completed that showed no acute changes.  He is compliant with his current medication regimen and states that his Lasix is working as maintained stable weight and volume status.  Today his blood pressure was elevated initially at 140/76 and was 132/74 on recheck.  Advised patient to continue to check blood pressures at home and to reach out if blood pressure remains elevated above 1130systolic.  He states that his shortness of breath has improved daily Lasix.  Patient denies chest pain, palpitations, dyspnea, PND, orthopnea, nausea, vomiting, dizziness, syncope, edema, weight gain, or early satiety.  Home Medications    Current Outpatient Medications  Medication Sig Dispense Refill   acetaminophen (TYLENOL) 325 MG tablet Take 2 tablets (650 mg total) by mouth every 6 (six) hours as needed for mild pain.     albuterol (VENTOLIN HFA) 108 (90 Base) MCG/ACT inhaler Inhale 2 puffs into the lungs every 4 (four) hours as needed for wheezing or shortness of breath (((PLAN B))). 8 g 5   aspirin  EC 81 MG tablet Take 81 mg by mouth daily.     carbamazepine (TEGRETOL) 200 MG tablet Take 200 mg by mouth 2 (two) times daily.     donepezil (ARICEPT) 5 MG tablet Take 5 mg by mouth at bedtime.     esomeprazole (NEXIUM) 40 MG capsule Take 1 capsule (40 mg total) by mouth daily before breakfast. 30 capsule 2   finasteride (PROSCAR) 5 MG tablet Take 1 tablet by mouth daily.     Fluticasone-Umeclidin-Vilant (TRELEGY  ELLIPTA) 100-62.5-25 MCG/ACT AEPB Inhale 1 each into the lungs daily. 1 each 6   furosemide (LASIX) 20 MG tablet Take 1 tablet (20 mg total) by mouth daily. 90 tablet 3   isosorbide-hydrALAZINE (BIDIL) 20-37.5 MG tablet Take 1 tablet by mouth 3 (three) times daily. 90 tablet 0   Multiple Vitamin (MULTIVITAMIN WITH MINERALS) TABS tablet Take 1 tablet by mouth daily. 30 tablet 0   NIFEdipine (PROCARDIA-XL/NIFEDICAL-XL) 30 MG 24 hr tablet Take 30 mg by mouth daily.     oxybutynin (DITROPAN) 5 MG tablet Take 5 mg by mouth daily.     pravastatin (PRAVACHOL) 10 MG tablet Take 1 tablet (10 mg total) by mouth daily. 30 tablet 0   QUEtiapine (SEROQUEL) 50 MG tablet Take 1 tablet (50 mg total) by mouth at bedtime. 30 tablet 0   tamsulosin (FLOMAX) 0.4 MG CAPS capsule Take 0.4 mg by mouth daily.     TRELEGY ELLIPTA 100-62.5-25 MCG/ACT AEPB Inhale 1 puff into the lungs daily.     triamcinolone ointment (KENALOG) 0.1 % Apply topically 2 (two) times daily.     VITAMIN D PO Take 600 mg by mouth daily.     No current facility-administered medications for this visit.     Review of Systems  Please see the history of present illness.    (+) Chronic shortness of breath  All other systems reviewed and are otherwise negative except as noted above.  Physical Exam    Wt Readings from Last 3 Encounters:  01/24/22 161 lb (73 kg)  01/01/22 159 lb (72.1 kg)  12/14/21 160 lb 3.2 oz (72.7 kg)   VS: Vitals:   01/24/22 1133  BP: (!) 140/76  Pulse: 75  SpO2: 97%  ,Body mass index is 20.67 kg/m.  Constitutional:      Appearance: Healthy appearance. Not in distress.  Neck:     Vascular: JVD normal.  Pulmonary:     Effort: Pulmonary effort is normal.     Breath sounds: No wheezing. No rales. Diminished in the bases Cardiovascular:     Normal rate. Regular rhythm. Normal S1. Normal S2.      Murmurs: There is no murmur.  Edema:    Peripheral edema absent.  Abdominal:     Palpations: Abdomen is soft non  tender. There is no hepatomegaly.  Skin:    General: Skin is warm and dry.  Neurological:     General: No focal deficit present.     Mental Status: Alert and oriented to person, place and time.     Cranial Nerves: Cranial nerves are intact.  EKG/LABS/Other Studies Reviewed    ECG personally reviewed by me today -none completed today    Lab Results  Component Value Date   WBC 8.5 09/22/2018   HGB 11.7 (L) 09/22/2018   HCT 35.4 (L) 09/22/2018   MCV 91.2 09/22/2018   PLT 349 09/22/2018   Lab Results  Component Value Date   CREATININE 1.15 06/21/2019   BUN 13  06/21/2019   NA 144 06/21/2019   K 4.5 06/21/2019   CL 106 06/21/2019   CO2 24 06/21/2019   Lab Results  Component Value Date   ALT 14 09/22/2018   AST 20 09/22/2018   ALKPHOS 63 09/22/2018   BILITOT 0.7 09/22/2018   Lab Results  Component Value Date   CHOL 159 08/31/2018   HDL 66 08/31/2018   LDLCALC 83 08/31/2018   TRIG 76 09/06/2018   CHOLHDL 2.4 08/31/2018    Lab Results  Component Value Date   HGBA1C 5.6 08/31/2018    Assessment & Plan    1.  Dilated cardiomyopathy: -History of Takotsubo cardiomyopathy -2D echo in 09/2018 showed normalized EF of 60-65% and grade 1 DD. -Patient reports today that he has not experienced any chest pain since previous appointment -Continue ASA 81 mg and Pravachol 10 mg   2.  HTN: -Blood pressure today was slightly elevated at 140/76 and was 132/74 on recheck -Continue current antihypertensive regimen as noted above and patient advised to continue to check pressures at home and report systolic remaining above 223   3.  HLD: -Patient's last LDL cholesterol was well controlled at 68 and less than goal of 70 -Continue Pravachol as noted above   4.  HFpEF/ SOB with exertion -2D echo in 09/2018 showed normalized EF of 60-65% and grade 1 DD. -Today patient reports improved shortness of breath with exertion. -He reports that he has brisk urine output with Lasix daily -Low  sodium diet, fluid restriction <2L, and daily weights encouraged. Educated to contact our office for weight gain of 2 lbs overnight or 5 lbs in one week.    5.  History of intracranial bleed: -Patient currently followed by neurology   6.  COPD: -Patient currently followed by pulmonology -Continue current treatment plan as prescribed     Disposition: Follow-up with Sanda Klein, MD or APP in 12 months    Medication Adjustments/Labs and Tests Ordered: Current medicines are reviewed at length with the patient today.  Concerns regarding medicines are outlined above.   Signed, Mable Fill, Marissa Nestle, NP 01/24/2022, 12:02 PM [facility]  Note:  This document was prepared using Dragon voice recognition software and may include unintentional dictation errors.

## 2022-01-24 ENCOUNTER — Ambulatory Visit: Payer: Medicare Other | Attending: Nurse Practitioner | Admitting: Nurse Practitioner

## 2022-01-24 ENCOUNTER — Encounter: Payer: Self-pay | Admitting: Nurse Practitioner

## 2022-01-24 VITALS — BP 132/74 | HR 75 | Ht 74.0 in | Wt 161.0 lb

## 2022-01-24 DIAGNOSIS — I420 Dilated cardiomyopathy: Secondary | ICD-10-CM | POA: Diagnosis present

## 2022-01-24 DIAGNOSIS — I10 Essential (primary) hypertension: Secondary | ICD-10-CM | POA: Insufficient documentation

## 2022-01-24 DIAGNOSIS — I5032 Chronic diastolic (congestive) heart failure: Secondary | ICD-10-CM | POA: Diagnosis not present

## 2022-01-24 DIAGNOSIS — I611 Nontraumatic intracerebral hemorrhage in hemisphere, cortical: Secondary | ICD-10-CM | POA: Insufficient documentation

## 2022-01-24 DIAGNOSIS — E785 Hyperlipidemia, unspecified: Secondary | ICD-10-CM | POA: Insufficient documentation

## 2022-01-24 NOTE — Patient Instructions (Signed)
Medication Instructions:  Your physician recommends that you continue on your current medications as directed. Please refer to the Current Medication list given to you today.   *If you need a refill on your cardiac medications before your next appointment, please call your pharmacy*   Lab Work: None ordered   If you have labs (blood work) drawn today and your tests are completely normal, you will receive your results only by: [place] (if you have MyChart) OR A paper copy in the mail If you have any lab test that is abnormal or we need to change your treatment, we will call you to review the results.   Testing/Procedures: None ordered    Follow-Up: At [facility], you and your health needs are our priority.  As part of our continuing mission to provide you with exceptional heart care, we have created designated Provider Care Teams.  These Care Teams include your primary Cardiologist (physician) and Advanced Practice Providers (APPs -  Physician Assistants and Nurse Practitioners) who all work together to provide you with the care you need, when you need it.  We recommend signing up for the patient portal called "MyChart".  Sign up information is provided on this After Visit Summary.  MyChart is used to connect with patients for Virtual Visits (Telemedicine).  Patients are able to view lab/test results, encounter notes, upcoming appointments, etc.  Non-urgent messages can be sent to your provider as well.   To learn more about what you can do with MyChart, go to [email or web address].    Your next appointment:   12 month(s)  The format for your next appointment:   In Person  Provider:   Sanda Klein, MD     Other Instructions   Important Information About Sugar

## 2022-02-07 ENCOUNTER — Ambulatory Visit: Payer: Medicare Other | Admitting: Adult Health

## 2022-05-03 ENCOUNTER — Encounter: Payer: Self-pay | Admitting: Adult Health

## 2022-05-03 ENCOUNTER — Ambulatory Visit (INDEPENDENT_AMBULATORY_CARE_PROVIDER_SITE_OTHER): Payer: Medicare Other | Admitting: Adult Health

## 2022-05-03 VITALS — BP 134/80 | HR 68 | Temp 97.8°F | Ht 74.0 in | Wt 160.2 lb

## 2022-05-03 DIAGNOSIS — J449 Chronic obstructive pulmonary disease, unspecified: Secondary | ICD-10-CM

## 2022-05-03 MED ORDER — TRELEGY ELLIPTA 100-62.5-25 MCG/ACT IN AEPB: 1.0000 | INHALATION_SPRAY | Freq: Every day | RESPIRATORY_TRACT | 0 refills | Status: DC

## 2022-05-03 NOTE — Progress Notes (Signed)
$'@Patient'Q$  ID: Jeremiah [name], male    DOB: [birth year], 77 y.o.   MRN: [id number]  Chief Complaint  Patient presents with   Follow-up    Referring provider: Selena Lesser, MD  HPI: 77 year old male former smoker followed for severe COPD History of prolonged critical illness in May 2020 with COPD exacerbation and acute respiratory failure with vent support, PE, left intracranial frontal lobe hemorrhage and stress-induced cardiomyopathy Medical history significant for cognitive impairment/dementia, hypertension and hyperlipidemia  TEST/EVENTS :  PFTs 01/24/2014  FEV1  1.30 (35%) ratio 49 with severe air trapping and no better p saba and dlco 48% -- referred to rehab 01/24/14 -completed -Med calendar 02/21/2014 > did not recognize it when reprinted 03/29/14 > reviewed , 08/03/2014 , 04/28/2015   CT chest 02/2016 no ILD , emphysema changes  -Prolonged illness 08/2018 with COPD ,VDRF , PE (not on anticoagulation due to [place]) , Cardiomyopathy, and Intracranial hemorrhage  2 D echo 08/2018 EF 20-25% -?[place] CM, mod RV dysfunction  V Doppler neg for DVT  Echo 09/2018 , EF 60%.  CT head -5 mm acute subcortical hemorrhage in the left frontal lobe. Extensive chronic white matter disease, question amyloid angiopathy given this location. CT chest -Single subsegmental pulmonary embolism to the left lower lobe. Emphysema    2D echo September 15, 2018 EF at 60-65%   05/03/2022 Follow up : COPD with emphysema .  Patient presents for 54-monthfollow-up.  Patient has underlying severe COPD.  Overall says he is doing okay.  [place] [facility] [place] [facility] [place]  He is on Trelegy.  Unfortunately he still has to pay $100 co-pay but says Trelegy is the only inhaler that works well for him.  He has tried [employer], academicin the past without perceived benefit.  He remains fairly active.  Denies any flare of cough or wheezing.  He is accompanied by his wife who is his caregiver.  Influenza vaccine is up-to-date.  Chest x-ray last  visit showed opacities in the mid lungs bilaterally.  Subsequent CT chest showed emphysema without any suspicious pulmonary nodules or lesions.  Stable 4.2 cm ascending thoracic aortic aneurysm. Remains active, does house chores without difficulty. Walks dog.  Eating well.      Allergies  Allergen Reactions   Sildenafil     Other reaction(s): Syncope    Immunization History  Administered Date(s) Administered   Fluad Quad(high Dose 65+) 01/26/2020, 02/26/2021, 01/01/2022   Influenza Split 01/13/2013   Influenza, High Dose Seasonal PF 12/28/2014, 01/15/2016, 01/13/2017, 01/16/2018, 01/11/2019   Influenza, Seasonal, Injecte, Preservative Fre 04/13/2009, 03/27/2010, 02/21/2011, 02/11/2012, 01/19/2013   Influenza,inj,Quad PF,6+ Mos 01/24/2014   Influenza-Unspecified 01/13/2005, 02/20/2006, 03/05/2007, 03/27/2010, 02/13/2014, 01/30/2015, 01/30/2016, 02/14/2019, 01/13/2021   PFIZER(Purple Top)SARS-COV-2 Vaccination 06/03/2019, 06/24/2019, 02/12/2020   Pneumococcal Conjugate-13 07/15/2013, 12/08/2017   Pneumococcal Polysaccharide-23 05/20/2005, 08/19/2014   Pneumococcal-Unspecified 04/15/2010   Td (Adult), 2 Lf Tetanus Toxid, Preservative Free 07/14/2004   Td (Adult),5 Lf Tetanus Toxid, Preservative Free 07/14/2004   Tdap 05/21/2011, 12/28/2014   Unspecified SARS-COV-2 Vaccination 06/03/2019, 06/24/2019, 02/12/2020   Zoster Recombinat (Shingrix) 08/24/2019, 11/30/2019   Zoster, Live 08/20/2011    Past Medical History:  Diagnosis Date   COPD (chronic obstructive pulmonary disease) ([place]    Depression    Dilated cardiomyopathy ([facility]    Hypertension    PTSD (post-traumatic stress disorder)    Sleep apnea     Tobacco History: Social History   Tobacco Use  Smoking Status Former   Packs/day: 1.00   Years: 30.00  Total pack years: 30.00   Types: Cigarettes   Quit date: 04/16/1987   Years since quitting: 35.0  Smokeless Tobacco Never   Counseling given: Not  Answered   Outpatient Medications Prior to Visit  Medication Sig Dispense Refill   acetaminophen (TYLENOL) 325 MG tablet Take 2 tablets (650 mg total) by mouth every 6 (six) hours as needed for mild pain.     albuterol (VENTOLIN HFA) 108 (90 Base) MCG/ACT inhaler Inhale 2 puffs into the lungs every 4 (four) hours as needed for wheezing or shortness of breath (((PLAN B))). 8 g 5   aspirin EC 81 MG tablet Take 81 mg by mouth daily.     carbamazepine (TEGRETOL) 200 MG tablet Take 200 mg by mouth 2 (two) times daily.     donepezil (ARICEPT) 5 MG tablet Take 5 mg by mouth at bedtime.     esomeprazole (NEXIUM) 40 MG capsule Take 1 capsule (40 mg total) by mouth daily before breakfast. 30 capsule 2   finasteride (PROSCAR) 5 MG tablet Take 1 tablet by mouth daily.     Fluticasone-Umeclidin-Vilant (TRELEGY ELLIPTA) 100-62.5-25 MCG/ACT AEPB Inhale 1 each into the lungs daily. 1 each 6   furosemide (LASIX) 20 MG tablet Take 1 tablet (20 mg total) by mouth daily. 90 tablet 3   isosorbide-hydrALAZINE (BIDIL) 20-37.5 MG tablet Take 1 tablet by mouth 3 (three) times daily. 90 tablet 0   Multiple Vitamin (MULTIVITAMIN WITH MINERALS) TABS tablet Take 1 tablet by mouth daily. 30 tablet 0   NIFEdipine (PROCARDIA-XL/NIFEDICAL-XL) 30 MG 24 hr tablet Take 30 mg by mouth daily.     oxybutynin (DITROPAN) 5 MG tablet Take 5 mg by mouth daily.     pravastatin (PRAVACHOL) 10 MG tablet Take 1 tablet (10 mg total) by mouth daily. 30 tablet 0   QUEtiapine (SEROQUEL) 50 MG tablet Take 1 tablet (50 mg total) by mouth at bedtime. 30 tablet 0   tamsulosin (FLOMAX) 0.4 MG CAPS capsule Take 0.4 mg by mouth daily.     triamcinolone ointment (KENALOG) 0.1 % Apply topically 2 (two) times daily.     VITAMIN D PO Take 600 mg by mouth daily.     TRELEGY ELLIPTA 100-62.5-25 MCG/ACT AEPB Inhale 1 puff into the lungs daily. (Patient not taking: Reported on 05/03/2022)     No facility-administered medications prior to visit.      Review of Systems:   Constitutional:   No  weight loss, night sweats,  Fevers, chills, fatigue, or  lassitude.  HEENT:   No headaches,  Difficulty swallowing,  Tooth/dental problems, or  Sore throat,                No sneezing, itching, ear ache, nasal congestion, post nasal drip,   CV:  No chest pain,  Orthopnea, PND, swelling in lower extremities, anasarca, dizziness, palpitations, syncope.   GI  No heartburn, indigestion, abdominal pain, nausea, vomiting, diarrhea, change in bowel habits, loss of appetite, bloody stools.   Resp:  No excess mucus, no productive cough,  No non-productive cough,  No coughing up of blood.  No change in color of mucus.  No wheezing.  No chest wall deformity  Skin: no rash or lesions.  GU: no dysuria, change in color of urine, no urgency or frequency.  No flank pain, no hematuria   MS:  No joint pain or swelling.  No decreased range of motion.  No back pain.    Physical Exam  BP 134/80 (BP Location:  Left Arm, Patient Position: Sitting, Cuff Size: Normal)   Pulse 68   Temp 97.8 F (36.6 C) (Oral)   Ht '6\' 2"'$  (1.88 m)   Wt 160 lb 3.2 oz (72.7 kg)   SpO2 96%   BMI 20.57 kg/m   GEN: A/Ox3; pleasant , NAD, well nourished    HEENT:  [place]/AT,  EACs-clear, TMs-wnl, NOSE-clear, THROAT-clear, no lesions, no postnasal drip or exudate noted.   NECK:  Supple w/ fair ROM; no JVD; normal carotid impulses w/o bruits; no thyromegaly or nodules palpated; no lymphadenopathy.    RESP  Clear  P & A; w/o, wheezes/ rales/ or rhonchi. no accessory muscle use, no dullness to percussion  CARD:  RRR, no m/r/g, no peripheral edema, pulses intact, no cyanosis or clubbing.  GI:   Soft & nt; nml bowel sounds; no organomegaly or masses detected.   Musco: Warm bil, no deformities or joint swelling noted.   Neuro: alert, no focal deficits noted.    Skin: Warm, no lesions or rashes    Lab Results:     Imaging: No results found.       Latest Ref Rng &  Units 03/22/2016    9:59 AM 01/24/2014   10:51 AM  PFT Results  FVC-Pre L 3.33  2.66   FVC-Predicted Pre % 79  54   FVC-Post L 3.53  2.75   FVC-Predicted Post % 84  56   Pre FEV1/FVC % % 55  49   Post FEV1/FCV % % 51  46   FEV1-Pre L 1.82  1.30   FEV1-Predicted Pre % 57  35   FEV1-Post L 1.79  1.26   DLCO uncorrected ml/min/mmHg  17.16   DLCO UNC% %  48   DLVA Predicted %  60   TLC L  11.28   TLC % Predicted %  151   RV % Predicted %  306     No results found for: "NITRICOXIDE"      Assessment & Plan:   COPD (chronic obstructive pulmonary disease) ([place]) Patient is doing very well.  Recent CT chest showed stable emphysema.  No suspicious nodules.  Patient is continue on current triple therapy maintenance regimen.  Activity as tolerated.  Plan  Patient Instructions  Continue on TRELEGY  daily . Brush/rinse after use.  Continue on Mucinex DM Twice daily.  Activity as tolerated.  Recommend RSV vaccine . High protein diet.   Follow up with Dr. Chase Caller or Rayland Hamed in 6 months and  and As needed   Please contact office for sooner follow up if symptoms do not improve or worsen or seek emergency care          Rexene Edison, NP 05/03/2022

## 2022-05-03 NOTE — Addendum Note (Signed)
Addended by: Vanessa Barbara on: 05/03/2022 12:03 PM   Modules accepted: Orders

## 2022-05-03 NOTE — Assessment & Plan Note (Signed)
Patient is doing very well.  Recent CT chest showed stable emphysema.  No suspicious nodules.  Patient is continue on current triple therapy maintenance regimen.  Activity as tolerated.  Plan  Patient Instructions  Continue on TRELEGY  daily . Brush/rinse after use.  Continue on Mucinex DM Twice daily.  Activity as tolerated.  Recommend RSV vaccine . High protein diet.   Follow up with Dr. Chase Caller or Huda Petrey in 6 months and  and As needed   Please contact office for sooner follow up if symptoms do not improve or worsen or seek emergency care

## 2022-05-03 NOTE — Patient Instructions (Addendum)
Continue on TRELEGY  daily . Brush/rinse after use.  Continue on Mucinex DM Twice daily.  Activity as tolerated.  Recommend RSV vaccine . High protein diet.   Follow up with Dr. Chase Caller or Gared Gillie in 6 months and  and As needed   Please contact office for sooner follow up if symptoms do not improve or worsen or seek emergency care

## 2022-11-01 ENCOUNTER — Ambulatory Visit: Payer: Medicare Other | Admitting: Adult Health

## 2022-11-26 ENCOUNTER — Ambulatory Visit: Payer: Medicare Other | Admitting: Adult Health

## 2022-12-18 ENCOUNTER — Ambulatory Visit: Payer: Medicare Other | Admitting: Adult Health

## 2022-12-19 ENCOUNTER — Telehealth (INDEPENDENT_AMBULATORY_CARE_PROVIDER_SITE_OTHER): Payer: Medicare Other | Admitting: Internal Medicine

## 2022-12-19 ENCOUNTER — Encounter: Payer: Self-pay | Admitting: Internal Medicine

## 2022-12-19 DIAGNOSIS — Z7185 Encounter for immunization safety counseling: Secondary | ICD-10-CM | POA: Diagnosis not present

## 2022-12-19 DIAGNOSIS — R0602 Shortness of breath: Secondary | ICD-10-CM | POA: Diagnosis not present

## 2022-12-19 DIAGNOSIS — Z87891 Personal history of nicotine dependence: Secondary | ICD-10-CM

## 2022-12-19 DIAGNOSIS — J449 Chronic obstructive pulmonary disease, unspecified: Secondary | ICD-10-CM

## 2022-12-19 NOTE — Progress Notes (Signed)
Brief patient profile:  70 yobm quit smoking 1989 with no problems with variable sob x 2004 and maintained on inhalers referred 05/03/2013 by Dr Tyson Alias for evaluation of outpt management for copd after hospitalization and found to have GOLD III COPD 01/2014  with tendency to panic when sob.  Admit date: 04/24/2013  Discharge date: 04/27/2013  Discharge Diagnoses:  Active Problems:  COPD exacerbation  Detailed Hospital Course:  77 yo male with abrupt onset of sob (1/10) that progressively worsened until EMS called. Patient intubated in ED due to severe respiratory distress. In route, patient had increasing tachypnea, decline SpO2, and obtundation. Patient with GCS <8 at arrival, evidence of emesis, intubated for severe respiratory distress. Family reported no evidence of fevers, chills, malaise, worsening coughing, and sob. Initial episode passed, however sister received a call later with him reporting a return of symptoms, complaints of inability of breathing and EMS alerted. Distant history of tobacco abuse. Has never been hospitalized for COPD before this episode. He has had no recent hospitalizations, and has never been previously intubated.  He was admitted to the ICU on 1/11 and was treated with the typical modalities for acute respiratory failure (for presumed AECODP) which included mechanical ventilation with sedation, inhaled bronchodilators, antibiotics, and systemic steroid therapy. He improved later that day and was able to be extubated and sedation was stopped. His care since that point has primarily focused on the de-escalation of the therapies mentioned above. On 1/13 he has returned to a near-baseline state of health and has been deemed a candidate for discharge.  Discharge Plan by diagnoses  Acute exacerbation of COPD  Acute hypercarbic respiratory failure (resolved)  plan  -Follow up with Dr. Sherene Sires on 05/03/2013 at 2:30PM  -Change steroids to PO Prednisone, taper over 7-10d   -Resume home dose BDs    History of Present Illness  05/03/2013 1st  Pulmonary office visit/ Wert cc more sob since Oct 2014 on symbicort 160 2 bid and spiriva daily  But still  daily use of proventil and no need for neb then needed neb x sev months prior to his admit above but back to baseline since d/c on pred still tapering off but using saba qid, not prn "out of habit" >>return for PFT   01/03/2014 Follow up  [name] .  Complains of increased episodes of increased DOE, wheezing, prod cough with white/clear mucus worse for last week. Family member says he has been progressively get worse over last year. Was admitted 04/2013 w/ COPD flare /VDRF . [name] from last office visit however did not follow up .   Followed at [facility] , says he was told he has Moderate COPD 6 yrs ago. On Symbicort and Spiriva.  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 Lots of occupational expousre in past.  Says he gets winded easily esp on incline.  Says worse for last week with increased cough and wheezing.  No hemotpysis, wt loss, calf pain, n/v/d, chest pain or fever.  Last abx ~2 months ago.  rec Doxycycline 100mg  Twice daily  For 7 days  Prednisone taper over next week Mucinex DM Twice daily  As needed  Cough /congestion  Continue on Symbicort and Spiriva .    01/24/2014 f/u ov/Wert re:  GOLD III Chief Complaint  Patient presents with   Follow-up    PFT done today. Pt states that SOB  and cough have improved some, but not back to normal baseline. Cough is prod with minimal clear sputum.   can do HT but no mall due to sob Prednisone really helped a lot but did not maintain improvement once finished >>pred taper add ppi bid and diet     12/12/2014 acute  ov/Wert re: ? aecopd /new gi/gu complaints ? From spiriva?   Chief Complaint  Patient presents with   Acute Visit    Pt c/o increased SOB and cough x 4 days. Cough is not  very prod- clear sputum. He also c/o increased gas and bloating.   downhill since prev ov, changing his own maint rx (dc'd pepcid) >  severe cough at hs onset 12/09/14  On spiriva lots of gi/gu issues  Started pred one day prior to OV  As per calendar action plan  Has only used saba so far in hfa form, has neb but doesn't feel he needs it yet.  >changed spiriva to [place]     04/04/2015  f/u ov/Wert re: copd  Chief Complaint  Patient presents with   Follow-up    pt following for COPD: pt states hes been its been on and off. pt states 3 weekws ago he was diagnosed with bronchitits but he feels much better. pt c/o a dry cough thats been bothering him mostly in the night time. pt states at times he has to take the CPAP off because he has these couhing fits. no c/o SOB , wheezing, or chest tightness.   has med calendar but not following the action plans at the bottom for cough  > For cough/ congestion > mucinex dm and flutter valve       07/20/2015  f/u ov/Wert re: aecopd/ using med calendar though not flutter or approp dose of mucinex dm/ confused with when to use pred  Chief Complaint  Patient presents with   Follow-up    pt c/o nonprod cough, chest congestion, sob X4-5 days.  S/s worse qhs.    last prednisone finished on 07/16/15 and getting worse again in terms of breathing and dry coughing rec In the event that you need your nebulizer more than rarely for your breathing: prednisone 20 mg daily until better  Then 10 mg daily x 5 days and stop For cough > mucinex dm 1200 mg every 12 hours and use the flutter valve Work on inhaler technique    08/29/2015  f/u ov/Wert re:  GOLD III  symbicort 160 2bid/ tudorza  Chief Complaint  Patient presents with   Follow-up    Cough has not improved since the last visit. He uses ventolin at least once per day and neb with albuterol 2 x daily on average.   Still using oil based vit D, following med calendar better / rarely taking prednisone but helps  when does  Lots of hoarseness and dry cough  Day >   >stop [place] and begin Spiriva     11/01/2015 NP  Acute OV  Presents for an acute office visit Patient complains of 2 weeks of increased cough, shortness of breath and wheezing. Feels that the high temperatures and humidity or making his breathing worse. Wears out with minimum activity   Denies any chest congestion/tightness, sinus pressure/drainage, fever, nausea or vomiting.  Has any fever or discolored mucus. Remains on Symbicort and Spiriva. Did start on prednisone over the last 1-2 days. Still has persistent cough and wheezing. Cough is keeping him up at night.. Patient and wife have  multiple questions about his severity of his underlying COPD. He went over purse lip breathing. He would like to try pulmonary rehabilitation again. rec Zpack to have on hold if symtpoms worsen with discolored mucus.  Prednisone taper over next week.  Hydromet 1 tsp every 6hr, As needed  Cough , may make sleepy.  Refer to pulmonary rehab    12/25/2015  f/u ov/Wert re: GOLD III copd/ symb/spiriva - using med calendar well  Chief Complaint  Patient presents with   Follow-up    Doing well and denies any co's today.   doe [facility] = can't walk a nl pace on a flat grade s sob but does fine slow and flat eg  WM shopping ok p using [facility] parking - rare saba need -has not needed pred recently main complaint is abd distention/ constipation  rec Try spiriva just one puff each am to see if bloating/constipation improve  and if so leave the dose just at one pff - if not ok try off completely after a few weeks of one pff but if off it makes no difference at all then just restart it at 2 each am as per med calendar  See calendar for specific medication instructions    01/17/2016 acute extended ov/Wert re: GOLD III/ aecopd  Chief Complaint  Patient presents with   Acute Visit    Pt c/o cough x 5 days- prod with min clear sputum.  He had nosebleed last night.     severe cough onset 10/1 > min white mucus, worse at hs  Confused with names of meds and correlating them with med calendar though did bring it with him  Did not previously disclose use of tessalon but says now he was using it prn and worked fine until it ran out  Note note now on tenoretic, also not on med calendar  rec For dry cough as per med calendar > tessalon pearls 200 mg every 4-6 hours as needed and use the flutter valve For congested cough > mucinex dm up to 1200  Every 12 hours and use the flutter valve also  Increase the spiriva back to 2 puffs each am  See calendar for specific medication instructions   01/25/2016 acute extended ov/Wert re: copd gold III/ chronic dry cough  Chief Complaint  Patient presents with   Acute Visit    Pt. c/o coughing on going  for couple of months, frequent coughing spills, some clear mucus, some sob, some wheezing, some chest pain after having coughing spells   improving ex tol at rehab s 02 Cough is worse around 3 am nightly x months but no excess mucus/ just dry hack ? Really using pepcid at hs as per med calendar  Has pred to take for flare of sob but not cough so has not tried it yet  >>pred taper , changed atenolol to bisoprolol    02/12/2016 Acute OV : COPD  He says he is feeling some better but has not gotten over his cough yet.  He started on tessalon last ov which is helping some.  Seen 2 weeks ago , given prednisone taper and change from atenolol to bisoprolol.  Chest xray last ov with copd changes , no acute process.  He denies chest pain , orthopnea, edema or fever.  Cough is mainly dry , sometimes worse at night .  Him and his wife are worried why the cough does not go away .     OV 03/22/2016  Chief  Complaint  Patient presents with   Follow-up    Changing from MW to MR. Pt here after PFT. Pt c/o dry cough and SOB at baseline. Pt denies CP/tightness and f/c/s.     77 year old male presents with his wife. They've been  married for 2 years. In 2015 he had significant life-threatening COPD exacerbation. Since then been on triple inhaler therapy. Currently stable. He is doing pulmonary rehabilitation maintenance phase of the second time. Overall he tells me that his major complains of moderate degree of cough and shortness of breath and wheezing. The cough is essentially dry. At pulmonary rehabilitation he does not desaturate. He has been doing this for one month. He'll finish pulmonary rehabilitation and another one month. Wife's main concern is that he's having frequent COPD exacerbations. According to her at least 5 times he's had COPD exacerbation requiring prednisone and antibiotic of both in the year 2017. Most recently was 2 weeks ago. Emotions trigger it. The house does not have any pets or bad carpet or mold or mildew or cockroach.  He underwent Pulmi function testing today and compared to 2015 he is actually improved to go stage II COPD. Previously was Gold stage III COPD. He had high resolution CT chest that shows coronary artery calcification for which I do not see any evidence of cardiac workup. He has emphysema but no evidence of interstitial lung disease or cancer.  Currently he does well. It is noted that he is on testosterone shots  OV 05/17/2016  Chief Complaint  Patient presents with   Follow-up    2 mo f/u. Breathing has been the same last visit. Denies any SOB    Follow-up Gold stage II COPD. He presents with his wife. In the past used to be Gold stage III COPD but when we repeated pulmonary function testing is probably function test that actually improved to Gold stage II in December 2017 [see below]. His main issues that he has severe chronic cough. They're also worried about recurrent COPD exacerbations. In reviewing the chart and in talking to him it appears that the cough is severe and rated as a score of 4/5 .and is present all the time associated with thick white mucus. He also has some  dyspnea. His cough is so significant that he has made some phone calls about it and he gets treated for COPD exacerbation with prednisone and antibiotic which actually helped his cough. Therefore he believes that he has recurrent COPD exacerbations. But on taking the history closely this cough is stable and is still at a level IV/V. He is frustrated by it. He denies it is clearing of the throat although in the office he is cleared his throat. His wife is wondering about other etiologies be on COPD this contributing to this cough. There is associated dyspnea that is significant. Immediate appears that these are all unchanged compared to his last visit in December 2017. But they're definitely bothering him. He did tell me that the Brio helped him. Of note he does have some postnasal drainage and review of the chart suggests that he's never had sinus CT  Of note at this time he is not interested in either roflumilast or partipation in aecopd prevention research trial     IMPRESSION: 1. No evidence of interstitial lung disease. 2. Moderate centrilobular emphysema and mild diffuse bronchial wall thickening, consistent with the provided history of COPD. 3. Small parenchymal bands in the posterior basilar bilateral lower lobes, most consistent with mild postinfectious/postinflammatory  scarring. 4. Aortic atherosclerosis. Ectatic ascending thoracic aorta, maximum diameter 4.1 cm. Recommend annual imaging followup by CTA or MRA. This recommendation follows 2010 ACCF/AHA/AATS/ACR/ASA/SCA/SCAI/SIR/STS/SVM Guidelines for the Diagnosis and Management of Patients with Thoracic Aortic Disease. Circulation. 2010; 121: G644-I347. 5. Three-vessel coronary atherosclerosis.     Electronically Signed   By: Delbert Phenix M.D.   On: 02/20/2016 13:59    OV 06/20/2016  Chief Complaint  Patient presents with   Follow-up    Pt states he is doing well; has cough with yellow mucus. Pt had CT maxillofacial done  05/23/16 and would like results.       Follow-up moderate COPD. He has history of recurrent exacerbations. Last visit limited CT sinus and the report suggested possible chronic sinusitis. I referred him to ENT. He  he saw Dr. Pollyann Kennedy in ENT and was reassured. They were even told according to the history that CT sinus was normal. They're asking for CT sinus report. Overall he is doing well. He is not using 3% saline nebulizer as advised him. He is using his Spiriva and Brio. There are no new issues. COPD cat score is 23 and is similar to baseline   OV 01/13/2017  Chief Complaint  Patient presents with   Follow-up    Pt states that he has been doing good. Pt states that he has realized he has been coughing more than usual. Occ SOB. Denies any CP.    Follow-up moderate COPD. He has a history of recurrent exacerbations. Currently on Spiriva and Brio and Singulair daily. This is a routine follow-up. He tells me that he had another exacerbation and finished a 10 day prednisone 10 days ago. He is close to baseline. He still has some coughing. He is wondering about daily prednisone after talking to a church member. He is willing to have a flu shot today. Otherwise no new complaints other than the fact that if any recurrent exacerbations over which is frustrated.  01/21/2017 Acute OV : Cough  Complains of 1 week of increased cough , mainly dry in nature. No wheezing. Was seen 1 week ago , changed to TRELEGY . Feels this is helping and making breathing better. Does not feel  Sick. No fever, discolored mucus, chest pain , orthopnea, edema. Does have some post nasal drainage , ticking in throat. Uses mint cough drops to help . Has some nasal stuffiness. Using Mucinex DM As needed  Cough . Cough is aggravating , interrupts is social activities. No GERD sx .    OV 04/25/2017  Chief Complaint  Patient presents with   Follow-up    Pt had recent hospitalization at [facility] in [place], and was there 1 night  before they left. States that he is doing good now and has no complaints of cough, SOB, orCP.   77 year old with Gold stage III COPD that changed to Gold stage II COPD after inhaler treatment.  He is now on trilogy inhaler.  Overall COPD stable.  In fact after starting trilogy inhaler his COPD CAT score is improved to 17.  He is here with his daughter.  The only issue is that on New Year's Eve that he had a vasovagal syncope episode and was taken to [facility] ER.  That his heart rate was 51 which is his baseline but they try to admit him and he walked out AMA.  It appears that he might have had some postural hypotension related to Viagra intake.  He has appointment with Dr. Sherril Croon cardiologist  pending.   09/05/2017 Acute OV : COPD  Patient presents for an acute office visit.  Patient complains over the last week that he has had increased cough congestion thick mucus wheezing and increased shortness of breath. He remains on Trelegy daily.  Denies any fever, chest pain, orthopnea, edema Has been using Mucinex without much relief.  Cough is aggravating and keeping him awake at night.  Denies any reflux symptoms. Appetite is  good with no nausea vomiting or diarrhea   OV 11/05/2017  Chief Complaint  Patient presents with   Follow-up    Pt states he has been doing well since last visit and denies any complaints.   Follow-up Gold stage II COPD.  Presents for routine follow-up. Last seen by myself in January 2019 and then in May 2019*nurse practitioner for COPD exacerbation. At this point in time he continues on TRELEGY ellipta inhaler and he is feeling great. COPD cat score is documented below with symptoms severe deep. It is at its lowest at 14. He is being around $55 for this. He has [facility] benefit and they want to give him anoro + arnuity for free but he feels that he is better off with trelegy and does not want to change. Review of the chart shows he is 30 pack smoking history quit 30 years ago most  recent CT chest was in November 2017 without any nodules.   TEST   PFTs 01/24/2014  FEV1  1.30 (35%) ratio 49 with severe air trapping and no better p saba and dlco 48% -- referred to rehab 01/24/14  -Med calendar 02/21/2014 > did not recognize it when reprinted 03/29/14 > reviewed , 08/03/2014 , 04/28/2015   CT chest 02/2016 no ILD , emphysema changes   OV 05/05/2018  Subjective:  Patient ID: Paulo Fruit, male , DOB: [birth year] , age 62 y.o. , MRN: [id number] , ADDRESS: [address] [place] [address]   05/05/2018 -   Chief Complaint  Patient presents with   Follow-up    Pt states he has been doing well and states his breathing is currently good. Pt states he has been having some problems with urinating a lot.     HPI Martha Guerry 77 y.o. -returns for follow-up of his COPD.  I personally saw him in July 2019.  After that he has had 3-4 visits with nurse practitioner for exacerbations.  Last CT chest 2017.  Now doing well on Trelegy.  COPD CAT score is documented below.  Compared to the past additionally worsening symptoms but he is not feeling it.  His?  Daughter feels that main reason for frequent exacerbation is because of relocation from [place] to [place] for social reasons.  He is asking if he can start yoga . his main issue is that for the last several months he has had random urinary incontinence.  He has not seen a urologist.  He is wondering if it is due to Trelegy but I told him Trelegy has an anticholinergic that usually causes urinary retention.  He believes he is had a PSA checked.  But is not sure about the results.  His smoking continues to be in remission.  bsegmental pulmonary embolism to the left lower lobe. Emphysema   01/25/2019 Follow up : COPD  Patient returns for a 41-month follow-up.  Patient has underlying severe COPD.  He did have a prolonged critical illness with a COPD exacerbation and vent dependent respiratory failure in May of this year.  His  hospitalization  was complicated by a small left lower lobe PE.  And a small 5 mm left intracranial frontal lobe hemorrhage.  He was not treated with anticoagulation.  He also had a stress-induced cardiomyopathy with initial echo with decreased EF at 2025%.  Follow-up echo showed resolution with EF at 60% and a grade 1 diastolic dysfunction.  Patient says since last visit he has been getting stronger each day.  Breathing is doing better feels that he is back to his baseline.  Patient says he is walking daily.  He denies any increased cough or wheezing.  Does get short of breath with heavy activity but his activity tolerance has improved.  He did have a repeat overnight oximetry test that did not show desaturation.  He is currently on no oxygen.  O2 saturations today on room air are 95%.  He remains on Trelegy daily. Wife is here today with him.  She helps him with his medical care.    01/01/2022 Follow up ; COPD  Patient presents for follow-up visit.  Last seen October 2020.  Patient has underlying severe COPD. Says overall is doing okay since last visit .  Gets winded with heavy activity or prolonged walking. Gets medical care through [facility]. Remains on Trelegy . Tried Breztri in past did not feel it worked as well. [facility] will not cover Trelegy . They pay $100 month for Trelegy.  Anxiety seems to cause some breathing issues. Has memory impairment .  Uses Albuterol usually .  Stays fairly active.  Denies any flare of cough or wheezing. Patient is accompanied by his wife  member who helps with his care.  Is not on oxygen.  O2 sats today in the office are at 95% on room a  05/03/2022 Follow up : COPD with emphysema .  Patient presents for 51-month follow-up.  Patient has underlying severe COPD.  Overall says he is doing okay.  Gets his medications through the [place].  He is on Trelegy.  Unfortunately he still has to pay $100 co-pay but says Trelegy is the only inhaler that works well for him.  He has  tried Clinical cytogeneticist in the past without perceived benefit.  He remains fairly active.  Denies any flare of cough or wheezing.  He is accompanied by his wife who is his caregiver.  Influenza vaccine is up-to-date.  Chest x-ray last visit showed opacities in the mid lungs bilaterally.  Subsequent CT chest showed emphysema without any suspicious pulmonary nodules or lesions.  Stable 4.2 cm ascending thoracic aortic aneurysm. Remains active, does house chores without difficulty. Walks dog.  Eating well.     OV 12/19/2022  Subjective:  Patient ID: Paulo Fruit, male , DOB: Nov 04, 1945 , age 42 y.o. , MRN: [id number] , ADDRESS: [address] [place] [facility] [id number] PCP Roger Kill, PA-C Patient Care Team: Bernita Buffy as PCP - General (Physician Assistant) Thurmon Fair, MD as PCP - Cardiology (Cardiology)  This Provider for this visit: Treatment Team:  Attending Provider: Kalman Shan, MD    12/19/2022 -   Chief Complaint  Patient presents with   Follow-up    Breathing has been good   Type of visit: Video Virtual Visit Identification of patient Joan Sahni with 04/10/1946 and MRN [id number] - 2 person identifier Risks: Risks, benefits, limitations of telephone visit explained. Patient understood and verbalized agreement to proceed Anyone else on call: daughter Patient location: his home This provider location: [address], [address]; [place]; [place] [address]. [facility]. [phone]    HPI Paulo Fruit 77 y.o. -presents for this video visit.  I personally not seen him in 4-1/2 years.  He is seeing nurse practitioner 2 times in the last 1 year.  He continues to live in [place] with his daughter.  He does not drive because of his dementia.  But he does go for daily walks.  Denies any shortness of breath.  Daughter also had test and apparently that he is doing well.. Interim Health status: No new complaints No new medical problems. No new  surgeries. No ER visits. No Urgent care visits. No changes to medications.  He continues to be compliant with his Trelegy.  Last CT scan of the chest October 2023.  Not personally visualized.    IMPRESSION: 1. No acute findings in the chest. Specifically, no suspicious pulmonary nodule or mass as questioned on recent chest x-ray. 2. 4.2 cm ascending thoracic aortic aneurysm, stable since 08/31/2018. Recommend annual imaging followup by CTA or MRA. This recommendation follows 2010 ACCF/AHA/AATS/ACR/ASA/SCA/SCAI/SIR/STS/SVM Guidelines for the Diagnosis and Management of Patients with Thoracic Aortic Disease. Circulation. 2010; 121: Z610-R604. Aortic aneurysm NOS (ICD10-I71.9) 3. Aortic Atherosclerosis (ICD10-I70.0) and Emphysema (ICD10-J43.9).     Electronically Signed   By: Kennith Center M.D.   On: 01/18/2022 07:43    PFT    Latest Ref Rng & Units 03/22/2016    9:59 AM 01/24/2014   10:51 AM  PFT Results  FVC-Pre L 3.33  2.66   FVC-Predicted Pre % 79  54   FVC-Post L 3.53  2.75   FVC-Predicted Post % 84  56   Pre FEV1/FVC % % 55  49   Post FEV1/FCV % % 51  46   FEV1-Pre L 1.82  1.30   FEV1-Predicted Pre % 57  35   FEV1-Post L 1.79  1.26   DLCO uncorrected ml/min/mmHg  17.16   DLCO UNC% %  48   DLVA Predicted %  60   TLC L  11.28   TLC % Predicted %  151   RV % Predicted %  306       LAB RESULTS last 96 hours No results found.  LAB RESULTS last 90 days No results found for this or any previous visit (from the past 2160 hour(s)).       has a past medical history of COPD (chronic obstructive pulmonary disease) (HCC), Depression, Dilated cardiomyopathy (HCC), Hypertension, PTSD (post-traumatic stress disorder), and Sleep apnea.   reports that he quit smoking about 35 years ago. His smoking use included cigarettes. He started smoking about 65 years ago. He has a 30 pack-year smoking history. He has never used smokeless tobacco.  Past Surgical History:  Procedure  Laterality Date   HAMMER TOE SURGERY  April 2017    Allergies  Allergen Reactions   Sildenafil     Other reaction(s): Syncope    Immunization History  Administered Date(s) Administered   Covid-19, Mrna,Vaccine(Spikevax)52yrs and older 10/01/2022   Fluad Quad(high Dose 65+) 01/26/2020, 02/26/2021, 01/01/2022   Influenza Split 01/13/2013   Influenza, High Dose Seasonal PF 12/28/2014, 01/15/2016, 01/13/2017, 01/16/2018, 01/11/2019   Influenza, Seasonal, Injecte, Preservative Fre 04/13/2009, 03/27/2010, 02/21/2011, 02/11/2012, 01/19/2013   Influenza,inj,Quad PF,6+ Mos 01/24/2014   Influenza-Unspecified 01/13/2005, 02/20/2006, 03/05/2007, 03/27/2010, 02/13/2014, 01/30/2015, 01/30/2016, 02/14/2019, 01/13/2021   PFIZER(Purple Top)SARS-COV-2 Vaccination 06/03/2019, 06/24/2019, 02/12/2020   Pneumococcal Conjugate-13 07/15/2013, 12/08/2017   Pneumococcal Polysaccharide-23 05/20/2005, 08/19/2014   Pneumococcal-Unspecified 04/15/2010   Td (Adult), 2 Lf Tetanus Toxid,  Preservative Free 07/14/2004   Td (Adult),5 Lf Tetanus Toxid, Preservative Free 07/14/2004   Tdap 05/21/2011, 12/28/2014   Unspecified SARS-COV-2 Vaccination 06/03/2019, 06/24/2019   Zoster Recombinant(Shingrix) 08/24/2019, 11/30/2019   Zoster, Live 08/20/2011    Family History  Problem Relation Age of Onset   Asthma Maternal Grandmother    Heart disease Father    Dementia Mother      Current Outpatient Medications:    acetaminophen (TYLENOL) 325 MG tablet, Take 2 tablets (650 mg total) by mouth every 6 (six) hours as needed for mild pain., Disp:  , Rfl:    albuterol (VENTOLIN HFA) 108 (90 Base) MCG/ACT inhaler, Inhale 2 puffs into the lungs every 4 (four) hours as needed for wheezing or shortness of breath (((PLAN B)))., Disp: 8 g, Rfl: 5   aspirin EC 81 MG tablet, Take 81 mg by mouth daily., Disp: , Rfl:    carbamazepine (TEGRETOL) 200 MG tablet, Take 200 mg by mouth 2 (two) times daily., Disp: , Rfl:    esomeprazole  (NEXIUM) 40 MG capsule, Take 1 capsule (40 mg total) by mouth daily before breakfast., Disp: 30 capsule, Rfl: 2   finasteride (PROSCAR) 5 MG tablet, Take 1 tablet by mouth daily., Disp: , Rfl:    Fluticasone-Umeclidin-Vilant (TRELEGY ELLIPTA) 100-62.5-25 MCG/ACT AEPB, Inhale 1 puff into the lungs daily at 6 (six) AM., Disp: 2 each, Rfl: 0   furosemide (LASIX) 20 MG tablet, Take 1 tablet (20 mg total) by mouth daily., Disp: 90 tablet, Rfl: 3   isosorbide-hydrALAZINE (BIDIL) 20-37.5 MG tablet, Take 1 tablet by mouth 3 (three) times daily., Disp: 90 tablet, Rfl: 0   Multiple Vitamin (MULTIVITAMIN WITH MINERALS) TABS tablet, Take 1 tablet by mouth daily., Disp: 30 tablet, Rfl: 0   NIFEdipine (PROCARDIA-XL/NIFEDICAL-XL) 30 MG 24 hr tablet, Take 30 mg by mouth daily., Disp: , Rfl:    pravastatin (PRAVACHOL) 10 MG tablet, Take 1 tablet (10 mg total) by mouth daily., Disp: 30 tablet, Rfl: 0   QUEtiapine (SEROQUEL) 50 MG tablet, Take 1 tablet (50 mg total) by mouth at bedtime., Disp: 30 tablet, Rfl: 0   tamsulosin (FLOMAX) 0.4 MG CAPS capsule, Take 0.4 mg by mouth daily., Disp: , Rfl:    triamcinolone ointment (KENALOG) 0.1 %, Apply topically 2 (two) times daily., Disp: , Rfl:    VITAMIN D PO, Take 600 mg by mouth daily., Disp: , Rfl:    donepezil (ARICEPT) 5 MG tablet, Take 5 mg by mouth at bedtime. (Patient not taking: Reported on 12/19/2022), Disp: , Rfl:    Fluticasone-Umeclidin-Vilant (TRELEGY ELLIPTA) 100-62.5-25 MCG/ACT AEPB, Inhale 1 each into the lungs daily. (Patient not taking: Reported on 12/19/2022), Disp: 1 each, Rfl: 6   oxybutynin (DITROPAN) 5 MG tablet, Take 5 mg by mouth daily. (Patient not taking: Reported on 12/19/2022), Disp: , Rfl:       Objective:   There were no vitals filed for this visit.  Estimated body mass index is 20.57 kg/m as calculated from the following:   Height as of 05/03/22: 6\' 2"  (1.88 m).   Weight as of 05/03/22: 160 lb 3.2 oz (72.7 kg).  @WEIGHTCHANGE @  There were  no vitals filed for this visit.   Physical Exam   General: No distress. Looks well O2 at rest: no Cane present: no Sitting in wheel chair: no Frail: no Obese: no Neuro: Alert and Oriented x 3. GCS 15. Speech normal Psych: Pleasant        Assessment:  ICD-10-CM   1. Stage 2 moderate COPD by GOLD classification (HCC)  J44.9     2. Vaccine counseling  Z71.85     3. SOB (shortness of breath)  R06.02     4. Former heavy cigarette smoker (20-39 per day)  Z87.891          Plan:     Patient Instructions     ICD-10-CM   1. Stage 2 moderate COPD by GOLD classification (HCC)  J44.9     2. Vaccine counseling  Z71.85     3. SOB (shortness of breath)  R06.02     4. Former heavy cigarette smoker (20-39 per day)  Z87.891       -Glad you are doing stable  Plan Continue on TRELEGY  daily . Brush/rinse after use.  Continue on Mucinex DM Twice daily.  Activity as tolerated.   Get CT scan of the chest without contrast in the next 3 to 6 months  Get high-dose flu shot in the fall - Get RSV vaccine  Follow up  - with Dr. Marchelle Gearing or Parrett in 3-6 months and  and As needed but after CT chest        FOLLOWUP Return in about 5 months (around 05/21/2023) for 15 min visit, with Dr Marchelle Gearing after CT chest.    SIGNATURE    Dr. Kalman Shan, M.D., F.C.C.P,  Pulmonary and Critical Care Medicine Staff Physician, [facility] Center Director - Interstitial Lung Disease  Program  Pulmonary Fibrosis [facility] at [facility] [place], [place], [address]  Pager: [phone], If no answer or between  15:00h - 7:00h: call [phone] Telephone: [phone]  4:57 PM 12/19/2022

## 2022-12-19 NOTE — Patient Instructions (Addendum)
    ICD-10-CM   1. Stage 2 moderate COPD by GOLD classification (HCC)  J44.9     2. Vaccine counseling  Z71.85     3. SOB (shortness of breath)  R06.02     4. Former heavy cigarette smoker (20-39 per day)  Z87.891       -Glad you are doing stable  Plan Continue on TRELEGY  daily . Brush/rinse after use.  Continue on Mucinex DM Twice daily.  Activity as tolerated.   Get CT scan of the chest without contrast in the next 3 to 6 months  Get high-dose flu shot in the fall - Get RSV vaccine  Follow up  - with Dr. Marchelle Gearing or Parrett in 3-6 months and  and As needed but after CT chest

## 2023-03-10 ENCOUNTER — Other Ambulatory Visit: Payer: Self-pay

## 2023-03-10 ENCOUNTER — Encounter (HOSPITAL_BASED_OUTPATIENT_CLINIC_OR_DEPARTMENT_OTHER): Payer: Self-pay

## 2023-03-10 ENCOUNTER — Emergency Department (HOSPITAL_BASED_OUTPATIENT_CLINIC_OR_DEPARTMENT_OTHER)
Admission: EM | Admit: 2023-03-10 | Discharge: 2023-03-11 | Disposition: A | Discharge status: Home / Self Care | Payer: Medicare Other | Attending: Emergency Medicine | Admitting: Emergency Medicine

## 2023-03-10 DIAGNOSIS — R55 Syncope and collapse: Secondary | ICD-10-CM | POA: Diagnosis present

## 2023-03-10 DIAGNOSIS — J449 Chronic obstructive pulmonary disease, unspecified: Secondary | ICD-10-CM | POA: Diagnosis not present

## 2023-03-10 DIAGNOSIS — Z79899 Other long term (current) drug therapy: Secondary | ICD-10-CM | POA: Insufficient documentation

## 2023-03-10 DIAGNOSIS — R739 Hyperglycemia, unspecified: Secondary | ICD-10-CM | POA: Diagnosis not present

## 2023-03-10 DIAGNOSIS — Z7982 Long term (current) use of aspirin: Secondary | ICD-10-CM | POA: Insufficient documentation

## 2023-03-10 DIAGNOSIS — I10 Essential (primary) hypertension: Secondary | ICD-10-CM | POA: Diagnosis not present

## 2023-03-10 DIAGNOSIS — Z7951 Long term (current) use of inhaled steroids: Secondary | ICD-10-CM | POA: Diagnosis not present

## 2023-03-10 LAB — URINALYSIS, ROUTINE W REFLEX MICROSCOPIC
Bacteria, UA: NONE SEEN
Bilirubin Urine: NEGATIVE
Glucose, UA: NEGATIVE mg/dL
Hgb urine dipstick: NEGATIVE
Ketones, ur: 15 mg/dL — AB
Leukocytes,Ua: NEGATIVE
Nitrite: NEGATIVE
Protein, ur: 30 mg/dL — AB
Specific Gravity, Urine: 1.032 — ABNORMAL HIGH (ref 1.005–1.030)
pH: 5.5 (ref 5.0–8.0)

## 2023-03-10 LAB — TROPONIN I (HIGH SENSITIVITY)
Troponin I (High Sensitivity): 6 ng/L (ref <18)
Troponin I (High Sensitivity): 6 ng/L (ref <18)

## 2023-03-10 LAB — CBC
HCT: 47.6 % (ref 39.0–52.0)
Hemoglobin: 16.1 g/dL (ref 13.0–17.0)
MCH: 32.5 pg (ref 26.0–34.0)
MCHC: 33.8 g/dL (ref 30.0–36.0)
MCV: 96 fL (ref 80.0–100.0)
Platelets: 177 10*3/uL (ref 150–400)
RBC: 4.96 MIL/uL (ref 4.22–5.81)
RDW: 12.8 % (ref 11.5–15.5)
WBC: 11.1 10*3/uL — ABNORMAL HIGH (ref 4.0–10.5)
nRBC: 0 % (ref 0.0–0.2)

## 2023-03-10 LAB — COMPREHENSIVE METABOLIC PANEL
ALT: 7 U/L (ref 0–44)
AST: 16 U/L (ref 15–41)
Albumin: 4.3 g/dL (ref 3.5–5.0)
Alkaline Phosphatase: 75 U/L (ref 38–126)
Anion gap: 9 (ref 5–15)
BUN: 13 mg/dL (ref 8–23)
CO2: 25 mmol/L (ref 22–32)
Calcium: 10.2 mg/dL (ref 8.9–10.3)
Chloride: 106 mmol/L (ref 98–111)
Creatinine, Ser: 0.97 mg/dL (ref 0.61–1.24)
GFR, Estimated: >60 mL/min (ref >60)
Glucose, Bld: 128 mg/dL — ABNORMAL HIGH (ref 70–99)
Potassium: 3.6 mmol/L (ref 3.5–5.1)
Sodium: 140 mmol/L (ref 135–145)
Total Bilirubin: 0.4 mg/dL (ref <1.2)
Total Protein: 7.3 g/dL (ref 6.5–8.1)

## 2023-03-10 LAB — CBG MONITORING, ED: Glucose-Capillary: 132 mg/dL — ABNORMAL HIGH (ref 70–99)

## 2023-03-10 LAB — LIPASE, BLOOD: Lipase: 26 U/L (ref 11–51)

## 2023-03-10 NOTE — ED Notes (Signed)
Pt. Ambulated well under own power down hallway and back

## 2023-03-10 NOTE — ED Provider Notes (Incomplete)
[place] EMERGENCY DEPARTMENT AT [facility] Provider Note   CSN: [id number] Arrival date & time: 03/10/23  2050     History {Add pertinent medical, surgical, social history, OB history to HPI:1} Chief Complaint  Patient presents with  . Near Syncope  . Emesis    Jeremiah [name] is a 77 y.o. male.  The history is provided by the patient.  Near Syncope  Emesis He has history of hypertension, COPD, dilated cardiomyopathy and came in because of a near syncopal episode at home.  He had eaten dinner and he started feeling dizzy and lightheaded.  There is associated nausea and diaphoresis.  He denies chest pain, heaviness, tightness, pressure.  He denies dyspnea.  He denies palpitations.  Stepson arrived shortly after this episode and stated that the patient seemed like he was feeling weak.  On arrival to the emergency department, nausea recurred and he vomited once.  He feels like he is back to baseline and stepson also feels that he appears to be back to his baseline.  He has never had any episodes like this before.   Home Medications Prior to Admission medications   Medication Sig Start Date End Date Taking? Authorizing Provider  acetaminophen (TYLENOL) 325 MG tablet Take 2 tablets (650 mg total) by mouth every 6 (six) hours as needed for mild pain. 09/29/18   Angiulli, Mcarthur Rossetti, PA-C  albuterol (VENTOLIN HFA) 108 (90 Base) MCG/ACT inhaler Inhale 2 puffs into the lungs every 4 (four) hours as needed for wheezing or shortness of breath (((PLAN B))). 11/25/18   Parrett, Virgel Bouquet, NP  aspirin EC 81 MG tablet Take 81 mg by mouth daily.    [provider]  carbamazepine (TEGRETOL) 200 MG tablet Take 200 mg by mouth 2 (two) times daily. 05/25/19   [provider]  donepezil (ARICEPT) 5 MG tablet Take 5 mg by mouth at bedtime. Patient not taking: Reported on 12/19/2022 12/22/21   [provider]  esomeprazole (NEXIUM) 40 MG capsule Take 1 capsule (40 mg total) by  mouth daily before breakfast. 09/29/18   Angiulli, Mcarthur Rossetti, PA-C  finasteride (PROSCAR) 5 MG tablet Take 1 tablet by mouth daily. 01/26/20   [provider]  Fluticasone-Umeclidin-Vilant (TRELEGY ELLIPTA) 100-62.5-25 MCG/ACT AEPB Inhale 1 each into the lungs daily. Patient not taking: Reported on 12/19/2022 01/01/22   Parrett, Virgel Bouquet, NP  Fluticasone-Umeclidin-Vilant (TRELEGY ELLIPTA) 100-62.5-25 MCG/ACT AEPB Inhale 1 puff into the lungs daily at 6 (six) AM. 05/03/22   Parrett, Virgel Bouquet, NP  furosemide (LASIX) 20 MG tablet Take 1 tablet (20 mg total) by mouth daily. 12/14/21   Gaston Islam., NP  isosorbide-hydrALAZINE (BIDIL) 20-37.5 MG tablet Take 1 tablet by mouth 3 (three) times daily. 09/29/18   Angiulli, Mcarthur Rossetti, PA-C  Multiple Vitamin (MULTIVITAMIN WITH MINERALS) TABS tablet Take 1 tablet by mouth daily. 09/22/18   Berton Mount I, MD  NIFEdipine (PROCARDIA-XL/NIFEDICAL-XL) 30 MG 24 hr tablet Take 30 mg by mouth daily. 08/03/20   [provider]  oxybutynin (DITROPAN) 5 MG tablet Take 5 mg by mouth daily. Patient not taking: Reported on 12/19/2022    [provider]  pravastatin (PRAVACHOL) 10 MG tablet Take 1 tablet (10 mg total) by mouth daily. 09/29/18   Angiulli, Mcarthur Rossetti, PA-C  QUEtiapine (SEROQUEL) 50 MG tablet Take 1 tablet (50 mg total) by mouth at bedtime. 09/29/18   Angiulli, Mcarthur Rossetti, PA-C  tamsulosin (FLOMAX) 0.4 MG CAPS capsule Take 0.4 mg by mouth daily.  [provider]  triamcinolone ointment (KENALOG) 0.1 % Apply topically 2 (two) times daily. 05/15/19   [provider]  VITAMIN D PO Take 600 mg by mouth daily.    [provider]      Allergies    Sildenafil    Review of Systems   Review of Systems  Cardiovascular:  Positive for near-syncope.  Gastrointestinal:  Positive for vomiting.  All other systems reviewed and are negative.   Physical Exam Updated Vital Signs BP 139/88   Pulse 76   Temp 97.8 F (36.6 C)    Resp 16   Ht 6' 1.5" (1.867 m)   Wt 70.3 kg   SpO2 100%   BMI 20.17 kg/m  Physical Exam Vitals and nursing note reviewed.   77 year old male, resting comfortably and in no acute distress. Vital signs are normal. Oxygen saturation is 100%, which is normal. Head is normocephalic and atraumatic. PERRLA, EOMI. Oropharynx is clear. Neck is nontender and supple without adenopathy. Lungs are clear without rales, wheezes, or rhonchi. Chest is nontender. Heart has regular rate and rhythm without murmur. Abdomen is soft, flat, nontender. Extremities have no cyanosis or edema, full range of motion is present. Skin is warm and dry without rash. Neurologic: Mental status is normal, moves all extremities equally.  ED Results / Procedures / Treatments   Labs (all labs ordered are listed, but only abnormal results are displayed) Labs Reviewed  COMPREHENSIVE METABOLIC PANEL - Abnormal; Notable for the following components:      Result Value   Glucose, Bld 128 (*)    All other components within normal limits  CBC - Abnormal; Notable for the following components:   WBC 11.1 (*)    All other components within normal limits  URINALYSIS, ROUTINE W REFLEX MICROSCOPIC - Abnormal; Notable for the following components:   Specific Gravity, Urine 1.032 (*)    Ketones, ur 15 (*)    Protein, ur 30 (*)    All other components within normal limits  CBG MONITORING, ED - Abnormal; Notable for the following components:   Glucose-Capillary 132 (*)    All other components within normal limits  LIPASE, BLOOD  TROPONIN I (HIGH SENSITIVITY)  TROPONIN I (HIGH SENSITIVITY)    EKG ED ECG REPORT   Date: 03/10/2023  Rate: 68  Rhythm: normal sinus rhythm  QRS Axis: normal  Intervals: normal  ST/T Wave abnormalities: nonspecific ST/T changes  Conduction Disutrbances:none  Narrative Interpretation: Minor nonspecific ST-T changes.  When compared with ECG of 09/13/2018, heart rate has decreased, T wave  inversions in inferior and anterolateral leads are no longer present.  Old EKG Reviewed: changes noted  I have personally reviewed the EKG tracing and agree with the computerized printout as noted.  Radiology No results found.  Procedures Procedures  Cardiac monitor shows normal sinus rhythm, per my interpretation.  Medications Ordered in ED Medications - No data to display  ED Course/ Medical Decision Making/ A&P   {   Click here for ABCD2, HEART and other calculatorsREFRESH Note before signing :1}                              Medical Decision Making Amount and/or Complexity of Data Reviewed Labs: ordered.   Near syncopal episode.  Differential diagnosis includes, but is not limited to, vasovagal episode, ACS, arrhythmia.  I have reviewed his electrocardiogram, and my interpretation is nonspecific ST and T changes  but significantly improved compared with prior.  I have reviewed his laboratory tests, and my interpretation is normal troponin x 2, elevated random glucose level and otherwise normal comprehensive metabolic panel, mild leukocytosis which is nonspecific, urinalysis significant only for small amount of protein and ketones consistent with episode of vomiting.  At this point, I feel he is likely safe for discharge.  I will check orthostatic vital signs and ability to ambulate first.  I have reviewed his past records and he did have an ED visit on 04/13/2017 for syncope, but details are not in care everywhere.  {Document critical care time when appropriate:1} {Document review of labs and clinical decision tools ie heart score, Chads2Vasc2 etc:1}  {Document your independent review of radiology images, and any outside records:1} {Document your discussion with family members, caretakers, and with consultants:1} {Document social determinants of health affecting pt's care:1} {Document your decision making why or why not admission, treatments were needed:1} Final Clinical  Impression(s) / ED Diagnoses Final diagnoses:  None    Rx / DC Orders ED Discharge Orders     None

## 2023-03-10 NOTE — ED Triage Notes (Addendum)
Pt reports that he got hot and sweaty this evening so he laid down on the floor. Pt had near syncopal episode. But did not actually lose consciousness. Pt also reports vomiting.

## 2023-03-10 NOTE — ED Provider Notes (Signed)
[place] EMERGENCY DEPARTMENT AT [facility] Provider Note   CSN: [id number] Arrival date & time: 03/10/23  2050     History  Chief Complaint  Patient presents with   Near Syncope   Emesis    Jeremiah [name] is a 77 y.o. male.  The history is provided by the patient.  Near Syncope  Emesis He has history of hypertension, COPD, dilated cardiomyopathy and came in because of a near syncopal episode at home.  He had eaten dinner and he started feeling dizzy and lightheaded.  There is associated nausea and diaphoresis.  He denies chest pain, heaviness, tightness, pressure.  He denies dyspnea.  He denies palpitations.  Stepson arrived shortly after this episode and stated that the patient seemed like he was feeling weak.  On arrival to the emergency department, nausea recurred and he vomited once.  He feels like he is back to baseline and stepson also feels that he appears to be back to his baseline.  He has never had any episodes like this before.   Home Medications Prior to Admission medications   Medication Sig Start Date End Date Taking? Authorizing Provider  acetaminophen (TYLENOL) 325 MG tablet Take 2 tablets (650 mg total) by mouth every 6 (six) hours as needed for mild pain. 09/29/18   Angiulli, Mcarthur Rossetti, PA-C  albuterol (VENTOLIN HFA) 108 (90 Base) MCG/ACT inhaler Inhale 2 puffs into the lungs every 4 (four) hours as needed for wheezing or shortness of breath (((PLAN B))). 11/25/18   Parrett, Virgel Bouquet, NP  aspirin EC 81 MG tablet Take 81 mg by mouth daily.    [provider]  carbamazepine (TEGRETOL) 200 MG tablet Take 200 mg by mouth 2 (two) times daily. 05/25/19   [provider]  donepezil (ARICEPT) 5 MG tablet Take 5 mg by mouth at bedtime. Patient not taking: Reported on 12/19/2022 12/22/21   [provider]  esomeprazole (NEXIUM) 40 MG capsule Take 1 capsule (40 mg total) by mouth daily before breakfast. 09/29/18   Angiulli, Mcarthur Rossetti, PA-C   finasteride (PROSCAR) 5 MG tablet Take 1 tablet by mouth daily. 01/26/20   [provider]  Fluticasone-Umeclidin-Vilant (TRELEGY ELLIPTA) 100-62.5-25 MCG/ACT AEPB Inhale 1 each into the lungs daily. Patient not taking: Reported on 12/19/2022 01/01/22   Parrett, Virgel Bouquet, NP  Fluticasone-Umeclidin-Vilant (TRELEGY ELLIPTA) 100-62.5-25 MCG/ACT AEPB Inhale 1 puff into the lungs daily at 6 (six) AM. 05/03/22   Parrett, Virgel Bouquet, NP  furosemide (LASIX) 20 MG tablet Take 1 tablet (20 mg total) by mouth daily. 12/14/21   Gaston Islam., NP  isosorbide-hydrALAZINE (BIDIL) 20-37.5 MG tablet Take 1 tablet by mouth 3 (three) times daily. 09/29/18   Angiulli, Mcarthur Rossetti, PA-C  Multiple Vitamin (MULTIVITAMIN WITH MINERALS) TABS tablet Take 1 tablet by mouth daily. 09/22/18   Berton Mount I, MD  NIFEdipine (PROCARDIA-XL/NIFEDICAL-XL) 30 MG 24 hr tablet Take 30 mg by mouth daily. 08/03/20   [provider]  oxybutynin (DITROPAN) 5 MG tablet Take 5 mg by mouth daily. Patient not taking: Reported on 12/19/2022    [provider]  pravastatin (PRAVACHOL) 10 MG tablet Take 1 tablet (10 mg total) by mouth daily. 09/29/18   Angiulli, Mcarthur Rossetti, PA-C  QUEtiapine (SEROQUEL) 50 MG tablet Take 1 tablet (50 mg total) by mouth at bedtime. 09/29/18   Angiulli, Mcarthur Rossetti, PA-C  tamsulosin (FLOMAX) 0.4 MG CAPS capsule Take 0.4 mg by mouth daily.    [provider]  triamcinolone ointment (  KENALOG) 0.1 % Apply topically 2 (two) times daily. 05/15/19   [provider]  VITAMIN D PO Take 600 mg by mouth daily.    [provider]      Allergies    Sildenafil    Review of Systems   Review of Systems  Cardiovascular:  Positive for near-syncope.  Gastrointestinal:  Positive for vomiting.  All other systems reviewed and are negative.   Physical Exam Updated Vital Signs BP 139/88   Pulse 76   Temp 97.8 F (36.6 C)   Resp 16   Ht 6' 1.5" (1.867 m)   Wt 70.3 kg   SpO2 100%    BMI 20.17 kg/m  Physical Exam Vitals and nursing note reviewed.   77 year old male, resting comfortably and in no acute distress. Vital signs are normal. Oxygen saturation is 100%, which is normal. Head is normocephalic and atraumatic. PERRLA, EOMI. Oropharynx is clear. Neck is nontender and supple without adenopathy. Lungs are clear without rales, wheezes, or rhonchi. Chest is nontender. Heart has regular rate and rhythm without murmur. Abdomen is soft, flat, nontender. Extremities have no cyanosis or edema, full range of motion is present. Skin is warm and dry without rash. Neurologic: Mental status is normal, moves all extremities equally.  ED Results / Procedures / Treatments   Labs (all labs ordered are listed, but only abnormal results are displayed) Labs Reviewed  COMPREHENSIVE METABOLIC PANEL - Abnormal; Notable for the following components:      Result Value   Glucose, Bld 128 (*)    All other components within normal limits  CBC - Abnormal; Notable for the following components:   WBC 11.1 (*)    All other components within normal limits  URINALYSIS, ROUTINE W REFLEX MICROSCOPIC - Abnormal; Notable for the following components:   Specific Gravity, Urine 1.032 (*)    Ketones, ur 15 (*)    Protein, ur 30 (*)    All other components within normal limits  CBG MONITORING, ED - Abnormal; Notable for the following components:   Glucose-Capillary 132 (*)    All other components within normal limits  LIPASE, BLOOD  TROPONIN I (HIGH SENSITIVITY)  TROPONIN I (HIGH SENSITIVITY)    EKG ED ECG REPORT   Date: 03/10/2023  Rate: 68  Rhythm: normal sinus rhythm  QRS Axis: normal  Intervals: normal  ST/T Wave abnormalities: nonspecific ST/T changes  Conduction Disutrbances:none  Narrative Interpretation: Minor nonspecific ST-T changes.  When compared with ECG of 09/13/2018, heart rate has decreased, T wave inversions in inferior and anterolateral leads are no longer present.   Old EKG Reviewed: changes noted  I have personally reviewed the EKG tracing and agree with the computerized printout as noted.  Procedures Procedures  Cardiac monitor shows normal sinus rhythm, per my interpretation.  Medications Ordered in ED Medications - No data to display  ED Course/ Medical Decision Making/ A&P                                 Medical Decision Making Amount and/or Complexity of Data Reviewed Labs: ordered.   Near syncopal episode.  Differential diagnosis includes, but is not limited to, vasovagal episode, ACS, arrhythmia.  I have reviewed his electrocardiogram, and my interpretation is nonspecific ST and T changes but significantly improved compared with prior.  I have reviewed his laboratory tests, and my interpretation is normal troponin x 2, elevated random glucose level  and otherwise normal comprehensive metabolic panel, mild leukocytosis which is nonspecific, urinalysis significant only for small amount of protein and ketones consistent with episode of vomiting.  At this point, I feel he is likely safe for discharge.  I will check orthostatic vital signs and ability to ambulate first.  I have reviewed his past records and he did have an ED visit on 04/13/2017 for syncope, but details are not in care everywhere.  Orthostatic vital signs showed no significant change in heart rate or blood pressure.  Patient was able to ambulate without difficulty.  I feel he is safe for discharge and follow-up with primary care provider.  Final Clinical Impression(s) / ED Diagnoses Final diagnoses:  Near syncope  Elevated random blood glucose level    Rx / DC Orders ED Discharge Orders     None         Dione Booze, MD 03/11/23 0010

## 2023-03-11 NOTE — Discharge Instructions (Signed)
Your evaluation in the emergency department did not show any sign of any serious problem.  However, if you are having any problems, please return immediately for further evaluation.

## 2023-09-16 ENCOUNTER — Other Ambulatory Visit: Payer: Self-pay | Admitting: *Deleted

## 2023-09-16 ENCOUNTER — Ambulatory Visit (INDEPENDENT_AMBULATORY_CARE_PROVIDER_SITE_OTHER): Admitting: Adult Health

## 2023-09-16 ENCOUNTER — Telehealth: Payer: Self-pay | Admitting: *Deleted

## 2023-09-16 ENCOUNTER — Encounter: Payer: Self-pay | Admitting: Adult Health

## 2023-09-16 VITALS — BP 118/78 | HR 62 | Ht 74.0 in | Wt 151.2 lb

## 2023-09-16 DIAGNOSIS — Z87891 Personal history of nicotine dependence: Secondary | ICD-10-CM

## 2023-09-16 DIAGNOSIS — R9389 Abnormal findings on diagnostic imaging of other specified body structures: Secondary | ICD-10-CM

## 2023-09-16 DIAGNOSIS — E441 Mild protein-calorie malnutrition: Secondary | ICD-10-CM | POA: Diagnosis not present

## 2023-09-16 DIAGNOSIS — I712 Thoracic aortic aneurysm, without rupture, unspecified: Secondary | ICD-10-CM

## 2023-09-16 DIAGNOSIS — J449 Chronic obstructive pulmonary disease, unspecified: Secondary | ICD-10-CM | POA: Diagnosis not present

## 2023-09-16 DIAGNOSIS — E46 Unspecified protein-calorie malnutrition: Secondary | ICD-10-CM | POA: Insufficient documentation

## 2023-09-16 NOTE — Patient Instructions (Addendum)
 Continue on TRELEGY  daily . Brush/rinse after use.  Continue on Mucinex  DM Twice daily.  Activity as tolerated.  High protein diet.   CT chest as planned  Follow up with Dr. Bertrum Brodie or Sian Joles in 6 months and  and As needed -with PFT   Please contact office for sooner follow up if symptoms do not improve or worsen or seek emergency care

## 2023-09-16 NOTE — Telephone Encounter (Signed)
 Called and spoke with patient's wife (DPR), I asked her why the CT had not been done?  She stated that he had an MRI done recently.  I advised her that the MRI of his brain would not show his lungs.  She said shw was trying to get ready to go to get him to the appointment for today at our appointment.  She said if a CT scan was ordered, he would have done it and to just order it again because the ball got dropped somewhere.  Advised her we would see them when they arrived.  I sent a message to the P[facility]s to see what happened with the scheduling of the CT chest in September of 2024.  Will await response.

## 2023-09-16 NOTE — Telephone Encounter (Signed)
 CT was ordered in 2023, it was cancelled by patient and then rescheduled.  Dr. Bertrum Brodie did a video visit in September 2024 and he did not order the CT chest.  I put the order in and sent a message to the [facility] and asked that it get scheduled ASAP and let them know that he has an appointment today in [place].  Nothing further needed.

## 2023-09-16 NOTE — Assessment & Plan Note (Signed)
Recommend a high-protein diet. 

## 2023-09-16 NOTE — Assessment & Plan Note (Addendum)
 COPD -severe COPD with emphysema.  Appears to be stable.  Continue on triple therapy maintenance regimen.  Activity as tolerated. Check PFTs on return visit  Plan  Patient Instructions  Continue on TRELEGY  daily . Brush/rinse after use.  Continue on Mucinex  DM Twice daily.  Activity as tolerated.  High protein diet.   CT chest as planned  Follow up with Dr. Bertrum Brodie or Valita Righter in 6 months and  and As needed -with PFT   Please contact office for sooner follow up if symptoms do not improve or worsen or seek emergency care

## 2023-09-16 NOTE — Progress Notes (Signed)
 @Patient  ID: Jeremiah [name], male    DOB: 17-May-1945, 78 y.o.   MRN: [id number]  Chief Complaint  Patient presents with   Follow-up    Referring provider: Jenell Mirza, *  HPI: 78 year old male former smoker followed for severe COPD History of prolonged critical illness in May 2020 with COPD exacerbation and acute respiratory failure requiring vent support, PE, left intracranial frontal lobe hemorrhage and stress-induced cardiomyopathy Medical history significant for cognitive impairment/dementia, hypertension and hyperlipidemia  TEST/EVENTS :  PFTs 01/24/2014  FEV1  1.30 (35%) ratio 49 with severe air trapping and no better p saba and dlco 48% -- referred to rehab 01/24/14 -completed -Med calendar 02/21/2014 > did not recognize it when reprinted 03/29/14 > reviewed , 08/03/2014 , 04/28/2015   CT chest 02/2016 no ILD , emphysema changes  -Prolonged illness 08/2018 with COPD ,VDRF , PE (not on anticoagulation due to ICH) , Cardiomyopathy, and Intracranial hemorrhage  2 D echo 08/2018 EF 20-25% -?Taksubo CM, mod RV dysfunction  V Doppler neg for DVT  Echo 09/2018 , EF 60%.  CT head -5 mm acute subcortical hemorrhage in the left frontal lobe. Extensive chronic white matter disease, question amyloid angiopathy given this location. CT chest -Single subsegmental pulmonary embolism to the left lower lobe. Emphysema  2D echo September 15, 2018 EF at 60-65% CT chest January 16, 2022-emphysema, no suspicious nodules, 4.2 cm ascending thoracic aortic aneurysm stable since 2020   09/16/2023 Follow up : COPD  Patient presents for a 27-month follow-up.  Patient is followed for severe COPD with emphysema.  He is accompanied by his wife today.  Gets his medications through the [place] system.  He remains on Trelegy inhaler daily.  Says overall he is doing well.  Denies any flare of cough or wheezing.  Does have a new puppy that he is still able to walk.  Gets short of breath with heavy activities.  CT chest  October 2023 showed stable emphysema and no suspicious nodules.  Patient has been recommended for a follow-up CT chest for known ascending thoracic aortic aneurysm was stable on last CT in 2023.  Has an upcoming CT chest later this week. Wife says that his appetite is not as good as it used to be.  She has to constantly remind him to eat.  Weight is down a few pounds.  We discussed a high-protein diet.  Allergies  Allergen Reactions   Sildenafil     Other reaction(s): Syncope    Immunization History  Administered Date(s) Administered   Fluad Quad(high Dose 65+) 01/26/2020, 02/26/2021, 01/01/2022   Influenza Split 01/13/2013   Influenza, High Dose Seasonal PF 12/28/2014, 01/15/2016, 01/13/2017, 01/16/2018, 01/11/2019   Influenza, Seasonal, Injecte, Preservative Fre 04/13/2009, 03/27/2010, 02/21/2011, 02/11/2012, 01/19/2013   Influenza,inj,Quad PF,6+ Mos 01/24/2014   Influenza-Unspecified 01/13/2005, 02/20/2006, 03/05/2007, 03/27/2010, 02/13/2014, 01/30/2015, 01/30/2016, 02/14/2019, 01/13/2021   Moderna Covid-19 Fall Seasonal Vaccine 6yrs & older 10/01/2022   PFIZER(Purple Top)SARS-COV-2 Vaccination 06/03/2019, 06/24/2019, 02/12/2020   Pneumococcal Conjugate-13 07/15/2013, 12/08/2017   Pneumococcal Polysaccharide-23 05/20/2005, 08/19/2014   Pneumococcal-Unspecified 04/15/2010   Td (Adult), 2 Lf Tetanus Toxid, Preservative Free 07/14/2004   Td (Adult),5 Lf Tetanus Toxid, Preservative Free 07/14/2004   Tdap 05/21/2011, 12/28/2014   Unspecified SARS-COV-2 Vaccination 06/03/2019, 06/24/2019   Zoster Recombinant(Shingrix) 08/24/2019, 11/30/2019   Zoster, Live 08/20/2011    Past Medical History:  Diagnosis Date   COPD (chronic obstructive pulmonary disease) (HCC)    Depression    Dilated cardiomyopathy (HCC)    Hypertension  PTSD (post-traumatic stress disorder)    Sleep apnea     Tobacco History: Social History   Tobacco Use  Smoking Status Former   Current packs/day: 0.00    Average packs/day: 1 pack/day for 30.0 years (30.0 ttl pk-yrs)   Types: Cigarettes   Start date: 04/15/1957   Quit date: 04/16/1987   Years since quitting: 36.4  Smokeless Tobacco Never   Counseling given: Not Answered   Outpatient Medications Prior to Visit  Medication Sig Dispense Refill   acetaminophen  (TYLENOL ) 325 MG tablet Take 2 tablets (650 mg total) by mouth every 6 (six) hours as needed for mild pain.     albuterol  (VENTOLIN  HFA) 108 (90 Base) MCG/ACT inhaler Inhale 2 puffs into the lungs every 4 (four) hours as needed for wheezing or shortness of breath (((PLAN B))). 8 g 5   aspirin  EC 81 MG tablet Take 81 mg by mouth daily.     carbamazepine  (TEGRETOL ) 200 MG tablet Take 200 mg by mouth 2 (two) times daily.     donepezil (ARICEPT) 5 MG tablet Take 5 mg by mouth at bedtime.     esomeprazole  (NEXIUM ) 40 MG capsule Take 1 capsule (40 mg total) by mouth daily before breakfast. 30 capsule 2   finasteride (PROSCAR) 5 MG tablet Take 1 tablet by mouth daily.     Fluticasone -Umeclidin-Vilant (TRELEGY ELLIPTA ) 100-62.5-25 MCG/ACT AEPB Inhale 1 each into the lungs daily. 1 each 6   furosemide  (LASIX ) 20 MG tablet Take 1 tablet (20 mg total) by mouth daily. 90 tablet 3   isosorbide -hydrALAZINE  (BIDIL ) 20-37.5 MG tablet Take 1 tablet by mouth 3 (three) times daily. 90 tablet 0   Multiple Vitamin (MULTIVITAMIN WITH MINERALS) TABS tablet Take 1 tablet by mouth daily. 30 tablet 0   NIFEdipine (PROCARDIA-XL/NIFEDICAL-XL) 30 MG 24 hr tablet Take 30 mg by mouth daily.     oxybutynin (DITROPAN) 5 MG tablet Take 5 mg by mouth daily.     pravastatin  (PRAVACHOL ) 10 MG tablet Take 1 tablet (10 mg total) by mouth daily. 30 tablet 0   QUEtiapine  (SEROQUEL ) 50 MG tablet Take 1 tablet (50 mg total) by mouth at bedtime. 30 tablet 0   tamsulosin (FLOMAX) 0.4 MG CAPS capsule Take 0.4 mg by mouth daily.     triamcinolone  ointment (KENALOG ) 0.1 % Apply topically 2 (two) times daily.     VITAMIN D PO Take 600 mg  by mouth daily.     Fluticasone -Umeclidin-Vilant (TRELEGY ELLIPTA ) 100-62.5-25 MCG/ACT AEPB Inhale 1 puff into the lungs daily at 6 (six) AM. (Patient not taking: Reported on 09/16/2023) 2 each 0   No facility-administered medications prior to visit.     Review of Systems:   Constitutional:   No  weight loss, night sweats,  Fevers, chills, fatigue, or  lassitude.  HEENT:   No headaches,  Difficulty swallowing,  Tooth/dental problems, or  Sore throat,                No sneezing, itching, ear ache, nasal congestion, post nasal drip,   CV:  No chest pain,  Orthopnea, PND, swelling in lower extremities, anasarca, dizziness, palpitations, syncope.   GI  No heartburn, indigestion, abdominal pain, nausea, vomiting, diarrhea, change in bowel habits, loss of appetite, bloody stools.   Resp:   No excess mucus, no productive cough,  No non-productive cough,  No coughing up of blood.  No change in color of mucus.  No wheezing.  No chest wall deformity  Skin: no rash  or lesions.  GU: no dysuria, change in color of urine, no urgency or frequency.  No flank pain, no hematuria   MS:  No joint pain or swelling.  No decreased range of motion.  No back pain.    Physical Exam  BP 118/78 (BP Location: Left Arm, Patient Position: Sitting, Cuff Size: Normal)   Pulse 62   Ht 6\' 2"  (1.88 m)   Wt 151 lb 3.2 oz (68.6 kg)   SpO2 96%   BMI 19.41 kg/m   GEN: A/Ox3; pleasant , NAD, thin   HEENT:  [place]/AT,  NOSE-clear, THROAT-clear, no lesions, no postnasal drip or exudate noted.   NECK:  Supple w/ fair ROM; no JVD; normal carotid impulses w/o bruits; no thyromegaly or nodules palpated; no lymphadenopathy.    RESP  Clear  P & A; w/o, wheezes/ rales/ or rhonchi. no accessory muscle use, no dullness to percussion  CARD:  RRR, no m/r/g, no peripheral edema, pulses intact, no cyanosis or clubbing.  GI:   Soft & nt; nml bowel sounds; no organomegaly or masses detected.   Musco: Warm bil, no deformities or  joint swelling noted.   Neuro: alert, no focal deficits noted.    Skin: Warm, no lesions or rashes    Lab Results:    BNP   Imaging: No results found.  Administration History     None          Latest Ref Rng & Units 03/22/2016    9:59 AM 01/24/2014   10:51 AM  PFT Results  FVC-Pre L 3.33  2.66   FVC-Predicted Pre % 79  54   FVC-Post L 3.53  2.75   FVC-Predicted Post % 84  56   Pre FEV1/FVC % % 55  49   Post FEV1/FCV % % 51  46   FEV1-Pre L 1.82  1.30   FEV1-Predicted Pre % 57  35   FEV1-Post L 1.79  1.26   DLCO uncorrected ml/min/mmHg  17.16   DLCO UNC% %  48   DLVA Predicted %  60   TLC L  11.28   TLC % Predicted %  151   RV % Predicted %  306     No results found for: "NITRICOXIDE"      Assessment & Plan:   COPD (chronic obstructive pulmonary disease) (HCC) COPD -severe COPD with emphysema.  Appears to be stable.  Continue on triple therapy maintenance regimen.  Activity as tolerated. Check PFTs on return visit  Plan  Patient Instructions  Continue on TRELEGY  daily . Brush/rinse after use.  Continue on Mucinex  DM Twice daily.  Activity as tolerated.  High protein diet.   CT chest as planned  Follow up with Dr. Bertrum Brodie or Carrie Schoonmaker in 6 months and  and As needed -with PFT   Please contact office for sooner follow up if symptoms do not improve or worsen or seek emergency care        Protein calorie malnutrition (HCC) Recommend a high-protein diet.  Thoracic aortic aneurysm (TAA) (HCC) Noted on CT imaging.  Serial CT chest pending for later this week.    Roena Clark, NP 09/16/2023

## 2023-09-16 NOTE — Assessment & Plan Note (Signed)
 Noted on CT imaging.  Serial CT chest pending for later this week.

## 2023-09-21 ENCOUNTER — Ambulatory Visit (HOSPITAL_BASED_OUTPATIENT_CLINIC_OR_DEPARTMENT_OTHER)

## 2023-10-08 ENCOUNTER — Ambulatory Visit (INDEPENDENT_AMBULATORY_CARE_PROVIDER_SITE_OTHER): Admitting: Internal Medicine

## 2023-10-08 DIAGNOSIS — J449 Chronic obstructive pulmonary disease, unspecified: Secondary | ICD-10-CM

## 2023-10-08 LAB — PULMONARY FUNCTION TEST
DL/VA % pred: 68 %
DL/VA: 2.67 ml/min/mmHg/L
DLCO cor % pred: 55 %
DLCO cor: 14.81 ml/min/mmHg
DLCO unc % pred: 55 %
DLCO unc: 14.81 ml/min/mmHg
FEF 25-75 Post: 0.83 L/s
FEF 25-75 Pre: 0.49 L/s
FEF2575-%Change-Post: 69 %
FEF2575-%Pred-Post: 35 %
FEF2575-%Pred-Pre: 20 %
FEV1-%Change-Post: 23 %
FEV1-%Pred-Post: 45 %
FEV1-%Pred-Pre: 36 %
FEV1-Post: 1.5 L
FEV1-Pre: 1.21 L
FEV1FVC-%Change-Post: 1 %
FEV1FVC-%Pred-Pre: 63 %
FEV6-%Change-Post: 22 %
FEV6-%Pred-Post: 73 %
FEV6-%Pred-Pre: 60 %
FEV6-Post: 3.16 L
FEV6-Pre: 2.57 L
FEV6FVC-%Change-Post: 0 %
FEV6FVC-%Pred-Post: 104 %
FEV6FVC-%Pred-Pre: 103 %
FVC-%Change-Post: 22 %
FVC-%Pred-Post: 71 %
FVC-%Pred-Pre: 58 %
FVC-Post: 3.22 L
FVC-Pre: 2.63 L
Post FEV1/FVC ratio: 46 %
Post FEV6/FVC ratio: 98 %
Pre FEV1/FVC ratio: 46 %
Pre FEV6/FVC Ratio: 98 %

## 2023-10-08 NOTE — Progress Notes (Signed)
 Spirometry pre/post and diffusion capacity performed today. Patient unable to maintain pant frequency to complete pleth.

## 2023-10-08 NOTE — Patient Instructions (Signed)
 Pre-post spirometry performed today. Patient unable to keep pant frequency to complete pleth.

## 2023-10-13 ENCOUNTER — Encounter: Payer: Self-pay | Admitting: Internal Medicine

## 2023-10-14 NOTE — Telephone Encounter (Signed)
**Note De-identified  [place] Obfuscation** Please advise 

## 2023-10-16 NOTE — Telephone Encounter (Signed)
 Well that shows COPD of stage III of severe category.  Tammy ordered it for 6 months and to follow-up with me in 6 months.  I do not know why it was done right now because there is no urgency for this.  Please set up follow-up per Tammy's recommendations      Latest Ref Rng & Units 10/08/2023    3:23 PM 03/22/2016    9:59 AM 01/24/2014   10:51 AM  PFT Results  FVC-Pre L 2.63  3.33  2.66   FVC-Predicted Pre % 58  79  54   FVC-Post L 3.22  3.53  2.75   FVC-Predicted Post % 71  84  56   Pre FEV1/FVC % % 46  55  49   Post FEV1/FCV % % 46  51  46   FEV1-Pre L 1.21  1.82  1.30   FEV1-Predicted Pre % 36  57  35   FEV1-Post L 1.50  1.79  1.26   DLCO uncorrected ml/min/mmHg 14.81   17.16   DLCO UNC% % 55   48   DLCO corrected ml/min/mmHg 14.81     DLCO COR %Predicted % 55     DLVA Predicted % 68   60   TLC L   11.28   TLC % Predicted %   151   RV % Predicted %   306

## 2023-10-21 ENCOUNTER — Ambulatory Visit: Payer: Self-pay | Admitting: Adult Health

## 2023-10-22 NOTE — Progress Notes (Signed)
ATC x1.  LVM to return call. 

## 2023-10-27 NOTE — Progress Notes (Signed)
ATC x2.  LVM to return call.

## 2023-11-05 NOTE — Progress Notes (Signed)
 ATC X3. LMTCB.  Will send pt a message through Mychart then completing note per protocol. NFN

## 2023-12-16 ENCOUNTER — Encounter (HOSPITAL_BASED_OUTPATIENT_CLINIC_OR_DEPARTMENT_OTHER): Payer: Self-pay

## 2023-12-16 NOTE — Progress Notes (Signed)
 Cardiology Office Note    Patient Name: Jeremiah [name] Date of Encounter: 12/16/2023  Primary Care Provider:  Trudy Elodia PARAS, PA-C Primary Cardiologist:  Jerel Balding, MD Primary Electrophysiologist: None   Past Medical History    Past Medical History:  Diagnosis Date   COPD (chronic obstructive pulmonary disease) (HCC)    Depression    Dilated cardiomyopathy (HCC)    Hypertension    PTSD (post-traumatic stress disorder)    Sleep apnea     History of Present Illness  Jeremiah [name] is a 78 y.o. male with PMH of HLD, HTN, OSA, PTSD,Takotsubo cardiomyopathy, ascending aortic aneurysm, ICA, COPD Gold 3 who presents today for overdue follow-up.  Gianino was last seen on 01/24/2022 for follow-up of shortness of breath.  During visit patient had maintained stable weight and volume status and blood pressures were also stable.  Had any chest pain during visit was advised to continue Lasix .  He was seen in the ED on 03/10/2023 with near syncope and vomiting.  With EGD was completed that showed sinus rhythm and troponins were normal x 2.  He also completed orthostatic vitals that showed no change in heart rate or blood pressure.  Patient denies chest pain, palpitations, dyspnea, PND, orthopnea, nausea, vomiting, dizziness, syncope, edema, weight gain, or early satiety.   Discussed the use of AI scribe software for clinical note transcription with the patient, who gave verbal consent to proceed.  History of Present Illness    ***Notes:   Review of Systems  Please see the history of present illness.    All other systems reviewed and are otherwise negative except as noted above.  Physical Exam    Wt Readings from Last 3 Encounters:  09/16/23 151 lb 3.2 oz (68.6 kg)  03/10/23 155 lb (70.3 kg)  05/03/22 160 lb 3.2 oz (72.7 kg)   CD:Uyzmz were no vitals filed for this visit.,There is no height or weight on file to calculate BMI. GEN: Well nourished, well developed in no acute  distress Neck: No JVD; No carotid bruits Pulmonary: Clear to auscultation without rales, wheezing or rhonchi  Cardiovascular: Normal rate. Regular rhythm. Normal S1. Normal S2.   Murmurs: There is no murmur.  ABDOMEN: Soft, non-tender, non-distended EXTREMITIES:  No edema; No deformity   EKG/LABS/ Recent Cardiac Studies   ECG personally reviewed by me today - ***  Risk Assessment/Calculations:   {Does this patient have ATRIAL FIBRILLATION?[phone]}      Lab Results  Component Value Date   WBC 11.1 (H) 03/10/2023   HGB 16.1 03/10/2023   HCT 47.6 03/10/2023   MCV 96.0 03/10/2023   PLT 177 03/10/2023   Lab Results  Component Value Date   CREATININE 0.97 03/10/2023   BUN 13 03/10/2023   NA 140 03/10/2023   K 3.6 03/10/2023   CL 106 03/10/2023   CO2 25 03/10/2023   Lab Results  Component Value Date   CHOL 159 08/31/2018   HDL 66 08/31/2018   LDLCALC 83 08/31/2018   TRIG 76 09/06/2018   CHOLHDL 2.4 08/31/2018    Lab Results  Component Value Date   HGBA1C 5.6 08/31/2018   Assessment & Plan    Assessment and Plan Assessment & Plan     1. Dilated cardiomyopathy: -History of Takotsubo cardiomyopathy -2D echo in 09/2018 showed normalized EF of 60-65% and grade 1 DD. -Patient reports today  2.  Essential hypertension  3.  HFpEF  4.  History of COPD Gold 3  5.History of intracranial  bleed: -Patient currently followed by neurology      Disposition: Follow-up with Jerel Balding, MD or APP in *** months {Are you ordering a CV Procedure (e.g. stress test, cath, DCCV, TEE, etc)?   Press F2        :[id number]}   Signed, Wyn Raddle, Jackee Shove, NP 12/16/2023, 8:29 AM [facility]

## 2023-12-17 ENCOUNTER — Ambulatory Visit: Attending: Nurse Practitioner | Admitting: Nurse Practitioner

## 2023-12-17 ENCOUNTER — Encounter: Payer: Self-pay | Admitting: Nurse Practitioner

## 2023-12-17 VITALS — BP 138/82 | HR 77 | Resp 16 | Ht 74.0 in | Wt 150.4 lb

## 2023-12-17 DIAGNOSIS — I611 Nontraumatic intracerebral hemorrhage in hemisphere, cortical: Secondary | ICD-10-CM | POA: Diagnosis present

## 2023-12-17 DIAGNOSIS — J449 Chronic obstructive pulmonary disease, unspecified: Secondary | ICD-10-CM | POA: Insufficient documentation

## 2023-12-17 DIAGNOSIS — E785 Hyperlipidemia, unspecified: Secondary | ICD-10-CM | POA: Diagnosis present

## 2023-12-17 DIAGNOSIS — I5032 Chronic diastolic (congestive) heart failure: Secondary | ICD-10-CM | POA: Insufficient documentation

## 2023-12-17 DIAGNOSIS — I10 Essential (primary) hypertension: Secondary | ICD-10-CM | POA: Diagnosis present

## 2023-12-17 DIAGNOSIS — I420 Dilated cardiomyopathy: Secondary | ICD-10-CM | POA: Diagnosis present

## 2023-12-17 NOTE — Patient Instructions (Signed)
 Medication Instructions:  Your physician recommends that you continue on your current medications as directed. Please refer to the Current Medication list given to you today. *If you need a refill on your cardiac medications before your next appointment, please call your pharmacy*  Lab Work: None ordered If you have labs (blood work) drawn today and your tests are completely normal, you will receive your results only by: MyChart Message (if you have MyChart) OR A paper copy in the mail If you have any lab test that is abnormal or we need to change your treatment, we will call you to review the results.  Testing/Procedures: Your physician has requested that you have an echocardiogram. Echocardiography is a painless test that uses sound waves to create images of your heart. It provides your doctor with information about the size and shape of your heart and how well your heart's chambers and valves are working. This procedure takes approximately one hour. There are no restrictions for this procedure. Please do NOT wear cologne, perfume, aftershave, or lotions (deodorant is allowed). Please arrive 15 minutes prior to your appointment time.  Please note: We ask at that you not bring children with you during ultrasound (echo/ vascular) testing. Due to room size and safety concerns, children are not allowed in the ultrasound rooms during exams. Our front office staff cannot provide observation of children in our lobby area while testing is being conducted. An adult accompanying a patient to their appointment will only be allowed in the ultrasound room at the discretion of the ultrasound technician under special circumstances. We apologize for any inconvenience.  Follow-Up: At [facility], you and your health needs are our priority.  As part of our continuing mission to provide you with exceptional heart care, our providers are all part of one team.  This team includes your primary Cardiologist  (physician) and Advanced Practice Providers or APPs (Physician Assistants and Nurse Practitioners) who all work together to provide you with the care you need, when you need it.  Your next appointment:   12 month(s)  Provider:   Jerel Balding, MD    We recommend signing up for the patient portal called MyChart.  Sign up information is provided on this After Visit Summary.  MyChart is used to connect with patients for Virtual Visits (Telemedicine).  Patients are able to view lab/test results, encounter notes, upcoming appointments, etc.  Non-urgent messages can be sent to your provider as well.   To learn more about what you can do with MyChart, go to [email or web address].   Other Instructions

## 2024-01-27 ENCOUNTER — Encounter: Payer: Self-pay | Admitting: Nurse Practitioner

## 2024-01-27 ENCOUNTER — Ambulatory Visit (HOSPITAL_COMMUNITY)
Admission: RE | Admit: 2024-01-27 | Discharge: 2024-01-27 | Disposition: A | Discharge status: Home / Self Care | Source: Ambulatory Visit | Attending: Internal Medicine | Admitting: Internal Medicine

## 2024-01-27 ENCOUNTER — Ambulatory Visit (HOSPITAL_COMMUNITY): Payer: Self-pay | Admitting: Nurse Practitioner

## 2024-01-27 DIAGNOSIS — J449 Chronic obstructive pulmonary disease, unspecified: Secondary | ICD-10-CM | POA: Insufficient documentation

## 2024-01-27 DIAGNOSIS — I420 Dilated cardiomyopathy: Secondary | ICD-10-CM | POA: Insufficient documentation

## 2024-01-27 DIAGNOSIS — E785 Hyperlipidemia, unspecified: Secondary | ICD-10-CM | POA: Diagnosis present

## 2024-01-27 DIAGNOSIS — I611 Nontraumatic intracerebral hemorrhage in hemisphere, cortical: Secondary | ICD-10-CM | POA: Insufficient documentation

## 2024-01-27 DIAGNOSIS — I5032 Chronic diastolic (congestive) heart failure: Secondary | ICD-10-CM | POA: Insufficient documentation

## 2024-01-27 DIAGNOSIS — I10 Essential (primary) hypertension: Secondary | ICD-10-CM | POA: Insufficient documentation

## 2024-01-27 LAB — ECHOCARDIOGRAM COMPLETE
AR max vel: 2.17 cm2
AV Area VTI: 2.36 cm2
AV Area mean vel: 2.03 cm2
AV Mean grad: 5.5 mmHg
AV Peak grad: 10.1 mmHg
Ao pk vel: 1.59 m/s
Area-P 1/2: 3.6 cm2
P 1/2 time: 624 ms
S' Lateral: 2.5 cm

## 2024-01-27 NOTE — Telephone Encounter (Signed)
 Error

## 2024-01-27 NOTE — Telephone Encounter (Signed)
 Spoke with pts wife regarding his echo results. Pts wife verbalized understanding and thank me for the call.
